# Patient Record
Sex: Male | Born: 1945
Health system: Southern US, Community
[De-identification: ages and names within clinical notes are randomized; demographics above are authoritative.]

## PROBLEM LIST (undated history)

## (undated) DIAGNOSIS — Z8739 Personal history of other diseases of the musculoskeletal system and connective tissue: Secondary | ICD-10-CM

## (undated) DIAGNOSIS — F32A Depression, unspecified: Secondary | ICD-10-CM

## (undated) DIAGNOSIS — M1990 Unspecified osteoarthritis, unspecified site: Secondary | ICD-10-CM

## (undated) DIAGNOSIS — E039 Hypothyroidism, unspecified: Secondary | ICD-10-CM

## (undated) DIAGNOSIS — Z860100 Personal history of colon polyps, unspecified: Secondary | ICD-10-CM

## (undated) DIAGNOSIS — I499 Cardiac arrhythmia, unspecified: Secondary | ICD-10-CM

## (undated) DIAGNOSIS — F319 Bipolar disorder, unspecified: Secondary | ICD-10-CM

## (undated) DIAGNOSIS — N419 Inflammatory disease of prostate, unspecified: Secondary | ICD-10-CM

## (undated) DIAGNOSIS — M549 Dorsalgia, unspecified: Secondary | ICD-10-CM

## (undated) DIAGNOSIS — G8929 Other chronic pain: Secondary | ICD-10-CM

## (undated) DIAGNOSIS — R972 Elevated prostate specific antigen [PSA]: Secondary | ICD-10-CM

## (undated) DIAGNOSIS — J45909 Unspecified asthma, uncomplicated: Secondary | ICD-10-CM

## (undated) DIAGNOSIS — K219 Gastro-esophageal reflux disease without esophagitis: Secondary | ICD-10-CM

## (undated) DIAGNOSIS — E785 Hyperlipidemia, unspecified: Secondary | ICD-10-CM

## (undated) DIAGNOSIS — F329 Major depressive disorder, single episode, unspecified: Secondary | ICD-10-CM

## (undated) DIAGNOSIS — I4891 Unspecified atrial fibrillation: Secondary | ICD-10-CM

## (undated) DIAGNOSIS — J449 Chronic obstructive pulmonary disease, unspecified: Secondary | ICD-10-CM

## (undated) DIAGNOSIS — D126 Benign neoplasm of colon, unspecified: Secondary | ICD-10-CM

## (undated) DIAGNOSIS — I10 Essential (primary) hypertension: Secondary | ICD-10-CM

## (undated) DIAGNOSIS — G4733 Obstructive sleep apnea (adult) (pediatric): Secondary | ICD-10-CM

## (undated) HISTORY — DX: Hypothyroidism, unspecified: E03.9

## (undated) HISTORY — PX: HAND / FINGER LESION EXCISION: SUR531

## (undated) HISTORY — DX: Personal history of colonic polyps: Z86.010

## (undated) HISTORY — DX: Essential (primary) hypertension: I10

## (undated) HISTORY — DX: Inflammatory disease of prostate, unspecified: N41.9

## (undated) HISTORY — DX: Personal history of colon polyps, unspecified: Z86.0100

## (undated) HISTORY — DX: Depression, unspecified: F32.A

## (undated) HISTORY — DX: Morbid (severe) obesity due to excess calories: E66.01

## (undated) HISTORY — PX: OTHER SURGICAL HISTORY: SHX169

## (undated) HISTORY — DX: Hyperlipidemia, unspecified: E78.5

## (undated) HISTORY — DX: Bipolar disorder, unspecified: F31.9

## (undated) HISTORY — DX: Major depressive disorder, single episode, unspecified: F32.9

## (undated) HISTORY — DX: Obstructive sleep apnea (adult) (pediatric): G47.33

## (undated) HISTORY — DX: Unspecified atrial fibrillation: I48.91

## (undated) HISTORY — DX: Benign neoplasm of colon, unspecified: D12.6

---
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
## 1993-07-15 HISTORY — PX: CATARACT EXTRACTION: SUR2

## 1999-04-04 ENCOUNTER — Inpatient Hospital Stay (HOSPITAL_COMMUNITY): Admission: EM | Admit: 1999-04-04 | Discharge: 1999-04-11 | Payer: Self-pay | Admitting: Cardiology

## 1999-06-11 ENCOUNTER — Ambulatory Visit (HOSPITAL_COMMUNITY): Admission: RE | Admit: 1999-06-11 | Discharge: 1999-06-11 | Payer: Self-pay | Admitting: Cardiology

## 1999-06-26 ENCOUNTER — Ambulatory Visit (HOSPITAL_COMMUNITY): Admission: RE | Admit: 1999-06-26 | Discharge: 1999-06-26 | Payer: Self-pay | Admitting: Cardiology

## 1999-11-20 ENCOUNTER — Inpatient Hospital Stay (HOSPITAL_COMMUNITY): Admission: EM | Admit: 1999-11-20 | Discharge: 1999-11-26 | Payer: Self-pay | Admitting: Emergency Medicine

## 2000-01-02 ENCOUNTER — Inpatient Hospital Stay (HOSPITAL_COMMUNITY): Admission: AD | Admit: 2000-01-02 | Discharge: 2000-01-04 | Payer: Self-pay | Admitting: *Deleted

## 2000-01-02 ENCOUNTER — Encounter: Payer: Self-pay | Admitting: *Deleted

## 2000-01-11 ENCOUNTER — Encounter: Payer: Self-pay | Admitting: Internal Medicine

## 2000-01-11 ENCOUNTER — Ambulatory Visit (HOSPITAL_COMMUNITY): Admission: RE | Admit: 2000-01-11 | Discharge: 2000-01-12 | Payer: Self-pay | Admitting: Internal Medicine

## 2000-11-13 ENCOUNTER — Ambulatory Visit (HOSPITAL_COMMUNITY): Admission: RE | Admit: 2000-11-13 | Discharge: 2000-11-13 | Payer: Self-pay | Admitting: General Surgery

## 2000-11-23 ENCOUNTER — Encounter: Payer: Self-pay | Admitting: Emergency Medicine

## 2000-11-23 ENCOUNTER — Inpatient Hospital Stay (HOSPITAL_COMMUNITY): Admission: EM | Admit: 2000-11-23 | Discharge: 2000-11-25 | Payer: Self-pay | Admitting: Emergency Medicine

## 2000-12-04 ENCOUNTER — Ambulatory Visit (HOSPITAL_COMMUNITY): Admission: RE | Admit: 2000-12-04 | Discharge: 2000-12-04 | Payer: Self-pay | Admitting: Urology

## 2000-12-30 ENCOUNTER — Ambulatory Visit: Admission: RE | Admit: 2000-12-30 | Discharge: 2000-12-30 | Payer: Self-pay | Admitting: Family Medicine

## 2001-02-02 ENCOUNTER — Ambulatory Visit: Admission: RE | Admit: 2001-02-02 | Discharge: 2001-02-02 | Payer: Self-pay | Admitting: Family Medicine

## 2001-02-19 ENCOUNTER — Ambulatory Visit (HOSPITAL_COMMUNITY): Admission: RE | Admit: 2001-02-19 | Discharge: 2001-02-19 | Payer: Self-pay | Admitting: Endocrinology

## 2001-02-24 ENCOUNTER — Ambulatory Visit (HOSPITAL_COMMUNITY): Admission: RE | Admit: 2001-02-24 | Discharge: 2001-02-24 | Payer: Self-pay | Admitting: Internal Medicine

## 2001-02-24 ENCOUNTER — Encounter: Payer: Self-pay | Admitting: Internal Medicine

## 2001-09-10 ENCOUNTER — Ambulatory Visit (HOSPITAL_COMMUNITY): Admission: RE | Admit: 2001-09-10 | Discharge: 2001-09-10 | Payer: Self-pay | Admitting: Family Medicine

## 2001-09-10 ENCOUNTER — Encounter: Payer: Self-pay | Admitting: Family Medicine

## 2001-10-13 ENCOUNTER — Encounter: Payer: Self-pay | Admitting: Neurosurgery

## 2001-10-13 ENCOUNTER — Encounter: Admission: RE | Admit: 2001-10-13 | Discharge: 2001-10-13 | Payer: Self-pay | Admitting: Neurosurgery

## 2001-10-27 ENCOUNTER — Encounter: Payer: Self-pay | Admitting: Neurosurgery

## 2001-10-27 ENCOUNTER — Encounter: Admission: RE | Admit: 2001-10-27 | Discharge: 2001-10-27 | Payer: Self-pay | Admitting: Neurosurgery

## 2001-11-16 ENCOUNTER — Encounter: Payer: Self-pay | Admitting: Neurosurgery

## 2001-11-16 ENCOUNTER — Ambulatory Visit (HOSPITAL_COMMUNITY): Admission: RE | Admit: 2001-11-16 | Discharge: 2001-11-16 | Payer: Self-pay | Admitting: Neurosurgery

## 2001-12-29 ENCOUNTER — Encounter
Admission: RE | Admit: 2001-12-29 | Discharge: 2002-03-18 | Payer: Self-pay | Admitting: Physical Medicine & Rehabilitation

## 2002-10-04 ENCOUNTER — Encounter
Admission: RE | Admit: 2002-10-04 | Discharge: 2002-11-26 | Payer: Self-pay | Admitting: Physical Medicine & Rehabilitation

## 2002-11-26 ENCOUNTER — Encounter
Admission: RE | Admit: 2002-11-26 | Discharge: 2003-02-24 | Payer: Self-pay | Admitting: Physical Medicine & Rehabilitation

## 2003-03-23 ENCOUNTER — Encounter
Admission: RE | Admit: 2003-03-23 | Discharge: 2003-06-21 | Payer: Self-pay | Admitting: Physical Medicine & Rehabilitation

## 2003-08-23 ENCOUNTER — Inpatient Hospital Stay (HOSPITAL_COMMUNITY): Admission: EM | Admit: 2003-08-23 | Discharge: 2003-08-28 | Payer: Self-pay | Admitting: Psychiatry

## 2004-01-12 ENCOUNTER — Ambulatory Visit (HOSPITAL_COMMUNITY): Admission: RE | Admit: 2004-01-12 | Discharge: 2004-01-12 | Payer: Self-pay | Admitting: Internal Medicine

## 2004-04-11 ENCOUNTER — Ambulatory Visit (HOSPITAL_COMMUNITY): Admission: RE | Admit: 2004-04-11 | Discharge: 2004-04-11 | Payer: Self-pay | Admitting: Family Medicine

## 2004-10-16 ENCOUNTER — Ambulatory Visit (HOSPITAL_COMMUNITY): Admission: RE | Admit: 2004-10-16 | Discharge: 2004-10-16 | Payer: Self-pay | Admitting: Family Medicine

## 2004-12-31 ENCOUNTER — Ambulatory Visit: Payer: Self-pay | Admitting: Internal Medicine

## 2005-01-29 ENCOUNTER — Ambulatory Visit (HOSPITAL_COMMUNITY): Admission: RE | Admit: 2005-01-29 | Discharge: 2005-01-29 | Payer: Self-pay | Admitting: Internal Medicine

## 2005-01-29 ENCOUNTER — Ambulatory Visit: Payer: Self-pay | Admitting: Internal Medicine

## 2006-03-24 ENCOUNTER — Ambulatory Visit (HOSPITAL_COMMUNITY): Admission: RE | Admit: 2006-03-24 | Discharge: 2006-03-24 | Payer: Self-pay | Admitting: Family Medicine

## 2009-04-18 ENCOUNTER — Encounter (INDEPENDENT_AMBULATORY_CARE_PROVIDER_SITE_OTHER): Payer: Self-pay | Admitting: *Deleted

## 2009-05-08 DIAGNOSIS — E039 Hypothyroidism, unspecified: Secondary | ICD-10-CM | POA: Insufficient documentation

## 2009-05-08 DIAGNOSIS — M545 Low back pain, unspecified: Secondary | ICD-10-CM | POA: Insufficient documentation

## 2009-05-08 DIAGNOSIS — R109 Unspecified abdominal pain: Secondary | ICD-10-CM | POA: Insufficient documentation

## 2009-05-08 DIAGNOSIS — K921 Melena: Secondary | ICD-10-CM | POA: Insufficient documentation

## 2009-05-08 DIAGNOSIS — Z8659 Personal history of other mental and behavioral disorders: Secondary | ICD-10-CM | POA: Insufficient documentation

## 2009-05-08 DIAGNOSIS — I10 Essential (primary) hypertension: Secondary | ICD-10-CM | POA: Insufficient documentation

## 2009-05-08 DIAGNOSIS — I4821 Permanent atrial fibrillation: Secondary | ICD-10-CM | POA: Insufficient documentation

## 2009-05-10 ENCOUNTER — Ambulatory Visit: Payer: Self-pay | Admitting: Gastroenterology

## 2009-05-10 DIAGNOSIS — Z860100 Personal history of colon polyps, unspecified: Secondary | ICD-10-CM | POA: Insufficient documentation

## 2009-05-11 ENCOUNTER — Encounter: Payer: Self-pay | Admitting: Internal Medicine

## 2009-05-12 ENCOUNTER — Telehealth: Payer: Self-pay | Admitting: Gastroenterology

## 2009-05-16 ENCOUNTER — Encounter: Payer: Self-pay | Admitting: Internal Medicine

## 2009-05-22 ENCOUNTER — Encounter: Payer: Self-pay | Admitting: Gastroenterology

## 2009-05-24 ENCOUNTER — Encounter: Payer: Self-pay | Admitting: Gastroenterology

## 2009-05-26 ENCOUNTER — Encounter: Payer: Self-pay | Admitting: Gastroenterology

## 2009-06-07 ENCOUNTER — Encounter: Payer: Self-pay | Admitting: Internal Medicine

## 2009-07-15 HISTORY — PX: COLONOSCOPY: SHX174

## 2009-07-27 ENCOUNTER — Encounter: Payer: Self-pay | Admitting: Internal Medicine

## 2009-07-27 ENCOUNTER — Ambulatory Visit: Payer: Self-pay | Admitting: Gastroenterology

## 2009-08-01 ENCOUNTER — Ambulatory Visit: Payer: Self-pay | Admitting: Internal Medicine

## 2009-08-01 ENCOUNTER — Ambulatory Visit (HOSPITAL_COMMUNITY): Admission: RE | Admit: 2009-08-01 | Discharge: 2009-08-01 | Payer: Self-pay | Admitting: Internal Medicine

## 2009-08-03 ENCOUNTER — Emergency Department (HOSPITAL_COMMUNITY): Admission: EM | Admit: 2009-08-03 | Discharge: 2009-08-03 | Payer: Self-pay | Admitting: Emergency Medicine

## 2009-08-03 ENCOUNTER — Encounter: Payer: Self-pay | Admitting: Orthopedic Surgery

## 2009-08-07 ENCOUNTER — Ambulatory Visit: Payer: Self-pay | Admitting: Orthopedic Surgery

## 2009-08-07 DIAGNOSIS — IMO0002 Reserved for concepts with insufficient information to code with codable children: Secondary | ICD-10-CM | POA: Insufficient documentation

## 2009-08-07 DIAGNOSIS — M25569 Pain in unspecified knee: Secondary | ICD-10-CM | POA: Insufficient documentation

## 2009-08-16 ENCOUNTER — Telehealth: Payer: Self-pay | Admitting: Orthopedic Surgery

## 2009-09-27 ENCOUNTER — Ambulatory Visit (HOSPITAL_COMMUNITY): Admission: RE | Admit: 2009-09-27 | Discharge: 2009-09-27 | Payer: Self-pay | Admitting: Family Medicine

## 2009-10-24 DIAGNOSIS — G4733 Obstructive sleep apnea (adult) (pediatric): Secondary | ICD-10-CM

## 2009-10-24 HISTORY — DX: Obstructive sleep apnea (adult) (pediatric): G47.33

## 2010-01-12 DIAGNOSIS — I4891 Unspecified atrial fibrillation: Secondary | ICD-10-CM

## 2010-01-12 HISTORY — DX: Unspecified atrial fibrillation: I48.91

## 2010-07-11 ENCOUNTER — Ambulatory Visit (HOSPITAL_COMMUNITY)
Admission: RE | Admit: 2010-07-11 | Discharge: 2010-07-11 | Payer: Self-pay | Source: Home / Self Care | Attending: Cardiovascular Disease | Admitting: Cardiovascular Disease

## 2010-07-19 ENCOUNTER — Ambulatory Visit (HOSPITAL_COMMUNITY)
Admission: RE | Admit: 2010-07-19 | Discharge: 2010-07-19 | Payer: Self-pay | Source: Home / Self Care | Attending: Cardiovascular Disease | Admitting: Cardiovascular Disease

## 2010-07-19 LAB — PROTIME-INR
INR: 1.97 — ABNORMAL HIGH (ref 0.00–1.49)
Prothrombin Time: 22.6 seconds — ABNORMAL HIGH (ref 11.6–15.2)

## 2010-08-05 ENCOUNTER — Encounter: Payer: Self-pay | Admitting: Orthopedic Surgery

## 2010-08-14 NOTE — Letter (Signed)
Summary: TCS ORDER  TCS ORDER   Imported By: Ave Filter 07/27/2009 17:43:01  _____________________________________________________________________  External Attachment:    Type:   Image     Comment:   External Document

## 2010-08-14 NOTE — Assessment & Plan Note (Signed)
Summary: ER/knee pain/xrays ap/humana/frs   Vital Signs:  Patient profile:   65 year old male Weight:      255 pounds Pulse rate:   82 / minute Resp:     18 per minute  Vitals Entered By: Fuller Canada MD (August 07, 2009 10:02 AM)  Referring Provider:  Nobie Putnam Primary Provider:  Patrica Duel, MD   History of Present Illness: I saw Fender Beightol in the office today for an initial visit.  He is a 65 years old man with the complaint of:  chief complaint:  right knee pain, ap er.  This is a very interesting patient.  Apparently he has a chronic back situation is on Tylox for pain he seen at [facility] for that.  He was going to his normal routine the backs exercises and he felt something pop in his knee as he was changing positions.  He went out to play with his grandchildren as he usually does but the next morning woke up and he couldn't bend his knee and had swelling.  Since that time his anterior knee pain which is a 9/10 very to a 7/10 and is worse when he tries to bend the knee.  He did go to emergency room at the request of his physician and x-rays there were negative, he was placed in immobilizer and presents today for further evaluation.  -other symptoms pain is described as constant pain achy.  Has stiffness of the knee, no catching or locking.  -xrays done & where:  08/03/09 right knee [facility].  Meds: Lisinopril/HCTZ, Oxcarbazepin, Synthroid, Propafenone, Coumadin, Vitamin B100, Vitamin C 500, Xanax, Ambien, Tylox.   Allergies (verified): No Known Drug Allergies  Past History:  Past Surgical History: RADIOFREQUENCY ABLATION Appendectomy Cataract Extraction, bilaterally Tonsillectomy Nerve block for chronic back pain circumsicion colonoscopy  Family History: Father, heart valve replacement Motehr, heart disease, DM, PNA No FH of CRC. Pat uncle deceased secondary ?gastric cancer. FH of Cancer:  Family History of Diabetes Family History Coronary Heart  Disease male < 72 Family History of Arthritis Hx, family, asthma  Social History: One child. Married. disabled Patient has never smoked.  Alcohol Use - no Illicit Drug Use - no no caffeine  Review of Systems General:  Complains of weight gain, fever, and fatigue; denies weight loss and chills. Cardiac :  Denies chest pain, angina, heart attack, heart failure, poor circulation, blood clots, and phlebitis. Resp:  Complains of short of breath and cough; denies difficulty breathing, COPD, and pneumonia. GI:  Complains of diarrhea and constipation; denies nausea, vomiting, difficulty swallowing, ulcers, GERD, and reflux. GU:  Complains of kidney stones, burning, and blood in urine; denies kidney failure, kidney transplant, poor stream, testicular cancer, and . Neuro:  Complains of headache, dizziness, weakness, and unsteady walking; denies migraines, numbness, and tremor. MS:  Complains of joint pain, rheumatoid arthritis, joint swelling, and gout; denies bone cancer, osteoporosis, and . Endo:  Complains of thyroid disease; denies goiter and diabetes. Psych:  Complains of bipolar; denies depression, mood swings, anxiety, panic attack, and schizophrenia. Derm:  Complains of itching; denies eczema and cancer. EENT:  Complains of cataracts, vertigo, sinusitis, toothaches, and bleeding gums; denies poor vision, glaucoma, poor hearing, ears ringing, and hoarseness. Immunology:  Complains of seasonal allergies and sinus problems; denies allergic to bee stings. Lymphatic:  Denies lymph node cancer and lymph edema.  Physical Exam  Additional Exam:  GEN: well developed, well nourished, normal grooming and hygiene, no deformity and normal body habitus.  CDV: pulses are normal, no edema, no erythema. no tenderness  Lymph: normal lymph nodes   Skin: no rashes, skin lesions or open sores   NEURO: normal coordination, reflexes, sensation.   Psyche: awake, alert and oriented. Mood normal    Gait: altered by the knee immobilizer and crutches  RIGHT knee Inspection revealed a very tense patient with probably an effusion in the knee but I couldn't tell because he never could relax his knee.  I got up to actively flex at 30 it was painful.  His muscle tone was normal.  I was able to test his collateral ligament stability which was intact.  LEFT knee full range of motion no swelling strength normal, knee stable.     Impression & Recommendations:  Problem # 1:  KNEE SPRAIN, ACUTE (ICD-844.9) Assessment New reviewed x-ray at the hospital I agree its normal are reviewed the report and agree with supportive state   unfortunately I could not examine this knee. Patient was too tense would not relax Says he is always stiff.  Recommend MRI to evaluate joint.  Recommend hinged knee brace as well to replace immobilizer.  Orders: New Patient Level III (36644)  Patient Instructions: 1)  MRI knee - right  2)  sprained knee  3)  Come back with films

## 2010-08-14 NOTE — Assessment & Plan Note (Signed)
Summary: PT NEEDS TO SET UP TCS HE WAS UNABLE TO KEEP HIS 11/10 APPT. ...   Visit Type:  F/U Primary Care Provider:  Patrica Duel, MD  Chief Complaint:  set up procedure.  History of Present Illness: Mr. Casey [name] is a pleasant 65 year old gentleman, patient of Dr. Patrica Duel, who presents to reschedule his colonscopy.  He has h/o left-sided abdominal pain and intermittent hematachezia. He tells me has been extensively evaluated at [facility] by his urologist, Dr. Gillian Shields.  He describes numerous imaging studies.  Patient tells me about 5 months ago he started having pain in his left side/flank. He also noted change in bowel movements.  Went from one BM daily to 3 every morning.  Stools hard to loose. He thought the pain was secondary to kidney stones. He also had hematuria in the setting of Coumadin therapy. He was told that he has two kidney stones on each side. They're very small and should not cause problems until he starts passing them. He states he has passed one from the left side. He says he is going to have upcoming surgery for what sounds like a uretheral stricture but his urologist will not do it until his has a colonoscopy. He also has been seen by a neurosurgeon.  He tells me that he has four bulging discs in his lower back which have been present for about 8 years. He has never had surgery.  Recently underwent nerve block and PT for muscle spasms.  His left-sided flank pain comes and goes. It is worse when he lies down especially at night. He usually sleeps about 3 hours and then wakes up with left-sided pain. He will then take a pain pill to go back to sleep. Whenever he is upright or sitting he has no pain.  Pain seems to be unrelated to bowel movements. He reports occasional brbpr especially when his INR is close to 3.  Denies melena, heartburn, vomiting, difficulty swallowing. Pain is not associated with meals. He lost about 30 pounds in the past 3 months but attributes to dietary  changes. He's gained about 15 pounds back however.  He notes his left sided abd pain goes away when his low back pain is gone.       Current Medications (verified): 1)  Rythmol 150 Mg Tabs (Propafenone Hcl) .... Take 1 Tablet By Mouth Two Times A Day 2)  Coumadin 7.5 Mg Tabs (Warfarin Sodium) .... As Directed 3)  Levoxyl 75 Mcg Tabs (Levothyroxine Sodium) .... Once Daily 4)  Trileptal 150 Mg Tabs (Oxcarbazepine) .... Take 2 Tablets in The Am and One Tablet in The Pm 5)  Lisinopril-Hydrochlorothiazide 20-12.5 Mg Tabs (Lisinopril-Hydrochlorothiazide) .... Two Tablets Every Am 6)  Tylox 5-500 Mg Caps (Oxycodone-Acetaminophen) .... As Needed 7)  Vitamin B100 .... Take 1 Tablet By Mouth Once A Day 8)  Vitamin C-500 Chewables .... Take Two Tablets Three Times Daily 9)  Ambien 10 Mg Tabs (Zolpidem Tartrate) .... Take 1 Tab By Mouth At Bedtime As Needed 10)  Alprazolam 0.5 Mg Tabs (Alprazolam) .... One Tablet At Bedtime 11)  Coumadin 10 Mg Tabs (Warfarin Sodium) .... Tues &sat  Allergies (verified): No Known Drug Allergies  Past History:  Past Medical History: Last updated: 05/10/2009 kidney stones Arthritis Colonoscopy, July 2006, Dr. Jena Gauss. Normal rectum, left-sided diverticula, terminal ileum normal to 10 cm. Colonoscopy, May 2002, Dr. Franky Macho, multiple polyps in the cecum, proximal transverse colon, rectum. Patient states that he developed a post polypectomy bleed requiring transfusion  and hospitalization. Hypothyroidism Bipolar disorder Atrial fibrillation Hypertension Chronic low back pain  Family History: Last updated: 05/10/2009 Father, heart valve replacement Motehr, heart disease, DM, PNA No FH of CRC. Pat uncle deceased secondary ?gastric cancer.  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  Alcohol Use - no Illicit Drug Use - no  Past Surgical History: RADIOFREQUENCY  ABLATION Appendectomy Cataract Extraction, bilaterally Tonsillectomy Nerve block for chronic back pain  Review of Systems General:  Denies fever, chills, sweats, anorexia, fatigue, weakness, and weight loss. Eyes:  Denies vision loss. ENT:  Denies nasal congestion, hoarseness, and difficulty swallowing. CV:  Denies chest pains, angina, palpitations, dyspnea on exertion, and peripheral edema. Resp:  Denies dyspnea at rest, dyspnea with exercise, cough, and sputum. GI:  See HPI. GU:  Denies urinary burning and blood in urine. MS:  Complains of low back pain and muscle cramps. Derm:  Denies rash and itching. Neuro:  Denies weakness, paralysis, abnormal sensation, seizures, frequent headaches, memory loss, and confusion. Psych:  Denies depression and anxiety. Endo:  Denies unusual weight change. Heme:  Denies bruising and bleeding. Allergy:  Denies hives and rash.  Vital Signs:  Patient profile:   64 year old male Height:      73 inches Weight:      262 pounds BMI:     34.69 Temp:     98.6 degrees F oral Pulse rate:   64 / minute BP sitting:   124 / 82  (left arm) Cuff size:   large  Vitals Entered By: Hendricks Limes LPN (July 27, 2009 10:37 AM)  Physical Exam  General:  Well developed, well nourished, no acute distress.obese.   Head:  Normocephalic and atraumatic. Eyes:  Conjunctivae pink, no scleral icterus.  Mouth:  Oropharyngeal mucosa moist, pink.  No lesions, erythema or exudate.    Neck:  Supple; no masses or thyromegaly. Lungs:  Clear throughout to auscultation. Heart:  Regular rate and rhythm; no murmurs, rubs,  or bruits. Abdomen:  Bowel sounds normal.  Abdomen is soft, mild left flank tenderness, nondistended.  No rebound or guarding.  No hepatosplenomegaly, masses or hernias.  No abdominal bruits.  Rectal:  deferred until time of colonoscopy.   Extremities:  No clubbing, cyanosis, edema or deformities noted. Neurologic:  Alert and  oriented x4;  grossly normal  neurologically. Skin:  Intact without significant lesions or rashes. Cervical Nodes:  No significant cervical adenopathy. Psych:  Alert and cooperative. Normal mood and affect.  Impression & Recommendations:  Problem # 1:  HEMATOCHEZIA (ICD-578.1)  Intermittent hematochezia in the setting of Coumadin therapy. He also has a history of colonic polyps, last colonoscopy over four years ago.  Recommend colonoscopy for further evaluation. He previously developed a post-polypectomy bleed after colonoscopy in 2002. Patient states at that time he only held coumadin one day before and after the procedure. Will plan on holding his Coumadin 4 days prior to procedure, as previously approved by Dr. Nobie Putnam.  Colonoscopy to be performed in near future.  Risks, alternatives, and benefits including but not limited to the risk of reaction to medication, bleeding, infection, and perforation were addressed.  Patient voiced understanding and provided verbal consent.     Problem # 2:  ABDOMINAL PAIN (ICD-789.00)  left-sided abdominal pain 5 months duration. Pain is primarily in the left flank area. Pain is associated with lying down. Unrelated to meals or bowel function.  At this point would be suspicious this pain  may be secondary to radiculopathy from his back as it is positional.    Other Orders: Est. Patient Level IV (16109)

## 2010-08-14 NOTE — Letter (Signed)
Summary: History form  History form   Imported By: Jacklynn Ganong 08/09/2009 14:49:07  _____________________________________________________________________  External Attachment:    Type:   Image     Comment:   External Document

## 2010-08-14 NOTE — Progress Notes (Signed)
Summary: Patient cx his MRI.  Phone Note Call from Patient   Caller: Patient Summary of Call: Patient called to cancel his MRI and follow up appointment. He is going back to his old doctor because of his insurance. Initial call taken by: Waldon Reining,  August 16, 2009 3:02 PM

## 2010-08-16 ENCOUNTER — Ambulatory Visit (HOSPITAL_COMMUNITY)
Admission: RE | Admit: 2010-08-16 | Discharge: 2010-08-16 | Disposition: A | Discharge status: Home / Self Care | Payer: Medicare PPO | Source: Ambulatory Visit | Attending: Cardiovascular Disease | Admitting: Cardiovascular Disease

## 2010-08-16 DIAGNOSIS — E669 Obesity, unspecified: Secondary | ICD-10-CM | POA: Insufficient documentation

## 2010-08-16 DIAGNOSIS — I4891 Unspecified atrial fibrillation: Secondary | ICD-10-CM | POA: Insufficient documentation

## 2010-08-16 DIAGNOSIS — F319 Bipolar disorder, unspecified: Secondary | ICD-10-CM | POA: Insufficient documentation

## 2010-08-16 DIAGNOSIS — I10 Essential (primary) hypertension: Secondary | ICD-10-CM | POA: Insufficient documentation

## 2010-08-16 DIAGNOSIS — R0602 Shortness of breath: Secondary | ICD-10-CM | POA: Insufficient documentation

## 2010-08-16 DIAGNOSIS — Z01810 Encounter for preprocedural cardiovascular examination: Secondary | ICD-10-CM | POA: Insufficient documentation

## 2010-08-18 NOTE — Op Note (Signed)
  Casey [name], Casey [name]                ACCOUNT NO.:  [id number]  MEDICAL RECORD NO.:  [id number]           PATIENT TYPE:  O  LOCATION:  CATH                         FACILITY:  [facility]  PHYSICIAN:  Thurmon Fair, MD     DATE OF BIRTH:  May 19, 1946  DATE OF PROCEDURE:  08/16/2010 DATE OF DISCHARGE:  08/16/2010                              OPERATIVE REPORT   Casey [name] is a 65 year old man with recurrent symptomatic atrial fibrillation.  A previous attempt at cardioversion was successful, but atrial fibrillation recurred despite treatment with low-dose propafenone.  He is now on a much high dose of propafenone and he is here for repeat attempt at cardioversion.  After risks and benefits of the procedure were described, the patient provided informed consent and was brought to the outpatient area in a fasting state.  His prothrombin time had been therapeutic for 4 weeks prior to this procedure.  Anesthesia was administered by Dr. Chaney Malling of the Anesthesiology Service using propofol 150 mg intravenously.  A single synchronized biphasic 150 joule cardioversion shock was administered via anterior and posterior chest pads with immediate conversion to sinus bradycardia with a rate of approximately 55 beats per minute.  A 1.8 second pause was seen immediately following the cardioversion shock.  No immediate complications occurred.     Thurmon Fair, MD     MC/MEDQ  D:  08/16/2010  T:  08/17/2010  Job:  [id number]  cc:   [facility]  Electronically Signed by Thurmon Fair M.D. on 08/17/2010 08:39:38 AM

## 2010-08-30 NOTE — H&P (Signed)
NAME:  Casey [name], Casey [name]                ACCOUNT NO.:  [id number]  MEDICAL RECORD NO.:  [id number]           PATIENT TYPE:  LOCATION:                                 FACILITY:  PHYSICIAN:  Thurmon Fair, MD     DATE OF BIRTH:  Dec 29, 1945  DATE OF ADMISSION: DATE OF DISCHARGE:                             HISTORY & PHYSICAL   This is an anticipation of a direct current synchronized cardioversion to be performed tomorrow on August 16, 2010.  Please refer to the recently dictated medical history and physical from July 24, 2010 as well as the updates from July 26, 2100 and the cardioversion procedure note from July 19, 2010.  Casey [name] is a 65 year old gentleman with a recurrent persistent atrial fibrillation.  Despite rate control, he remains symptomatic with complaints of dyspnea on exertion and general fatigue.  He is fully and chronically anticoagulated with warfarin.  He has never had a stroke. He does have a remote history of atrial flutter and underwent successful isthmus ablation many years ago.  He has never had a cardiac catheterization, but noninvasive workup with a Persantine Myoview performed in July 2011 showed no evidence of coronary insufficiency.  By echocardiogram, the extent of structural heart disease appears to be mild, left atrial dilatation with a left atrial diameter of 43 mm.  He has mild-to-moderate mitral regurgitation that is likely not hemodynamically significant.  Major comorbidities include obstructive sleep apnea for which he uses chronic CPAP and systemic hypertension with good control.  He has planned to undergo urological surgery for urethral strictures later this spring probably in March.  He underwent direct current cardioversion on July 19, 2010 and immediately felt improvement in stamina and reduction of breathlessness; however, he had early recurrence of atrial fibrillation despite taking propafenone 150 mg three times a day.  It  appears that atrial fibrillation recurred probably in the first 48-72 hours after cardioversion.  Since that time, we have escalated the dose of propafenone to 225 mg t.i.d. and subsequent to 300 mg t.i.d., but he remains in atrial fibrillation.  At the highest dose of propafenone, he has began to notice side effects such as worsening fatigue and dyspnea. He remains in atrial fibrillation with excellent rate control.  REVIEW OF SYSTEMS:  Significantly negative for chest pain, orthopnea, paroxysmal nocturnal dyspnea, edema, palpitations, dizziness, lightheadedness, syncope, focal neurological deficits, or intermittent claudication.  CURRENT MEDICATIONS: 1. Losartan/hydrochlorothiazide 100/25 once daily. 2. Oxcarbazepine 600 mg two tablets p.o. b.i.d. 3. Propafenone 300 mg t.i.d. 4. Levothyroxine 75 mcg daily. 5. Warfarin 13 mg daily. 6. Zolpidem 10 mg at bedtime p.r.n. 7. Furosemide 40 mg p.r.n. 8. Alprazolam 0.5 mg p.r.n. 9. Pravastatin 20 mg at bedtime daily. 10.Cipro 500 mg, for which he has a standing prescription for     recurrent urinary tract infections. 11.Oxycodone/acetaminophen 5/500 one p.o. every 4-6 hours p.r.n. 12.Omeprazole 40 mg daily p.r.n.  He does have allergies to LITHIUM and NORVASC.  PHYSICAL EXAMINATION:  VITAL SIGNS:  Blood pressure is 120/80, heart rate is 74, respiratory rate is 12, he weighs 260 pounds and a 60 feet tall for  BMI of 35. NECK:  Jugular venous pulsations are not elevated.  There is no hepatojugular reflux.  There are no carotid bruits. LUNGS:  Clear to auscultation bilaterally. CARDIOVASCULAR:  Shows normal position and quality of the apical impulse.  The rhythm is irregular.  There are no audible murmurs, rubs, or gallops. ABDOMEN:  Obese, but there is no evidence of tenderness, distention, hepatosplenomegaly or masses.  The bowel sounds are normal.  There are no flank bruits. EXTREMITIES:  Show no clubbing, cyanosis or edema.  He has  3+ pulses in the radials, ulnars, brachials, femorals and pedals bilaterally. NEUROLOGIC:  Brief neurological exam does not show any gross focal deficits.  Electrocardiogram shows atrial fibrillation with controlled rate and with a QT interval of around 430 milliseconds.  There was a minor intraventricular conduction delay mostly resemble an incomplete right bundle-branch block.  ASSESSMENT AND PLAN:  Casey [name] is still symptomatic with atrial fibrillation and according to our initial plans, we will go ahead with a synchronized cardioversion tomorrow.  If he again has early failure on even this high dose of propafenone, then an alternative antiarrhythmic agents such as sotalol may be considered.  Multaq is another option.  I would avoid amiodarone because the patient is very young age.  He does not have a history of congestive heart failure, so either sotalol or Multaq should be acceptable.  Finally, if he fails treatment of these medications as well, one might consider referral for atrial fibrillation ablation.  Regardless, continued the anticoagulation with warfarin is critical.  The risks and benefits of synchronous cardioversion were discussed with Mr. Fier.  He understands and agrees to proceed.     Thurmon Fair, MD     MC/MEDQ  D:  08/15/2010  T:  08/15/2010  Job:  [id number]  cc:   Corrie Mckusick, M.D.  Electronically Signed by Thurmon Fair M.D. on 08/30/2010 01:10:04 PM

## 2010-11-30 NOTE — Op Note (Signed)
[facility]  Patient:    Casey [name], Casey [name]                       MRN: [id number] Proc. Date: 12/04/00 Adm. Date:  [id number] Attending:  Evlyn Clines                           Operative Report  PROCEDURE:  Circumcision.  PREOPERATIVE DIAGNOSIS:  Phimosis.  POSTOPERATIVE DIAGNOSIS:  Phimosis.  SURGEON:  Dr. Bjorn Pippin.  ANESTHESIA:  MAC and local.  COMPLICATIONS:  None.  INDICATIONS FOR PROCEDURE:  Mr. Vacha is a 65 year old white male who has painful phimosis. He is to undergo circumcision.  FINDINGS AND PROCEDURE:  The patient is taken to the operating room where he is placed in the supine position. He was given IV sedation. His genitalia was prepped with Betadine solution and he was draped in the usual sterile fashion. The penis was infiltrated with approximately 10% of a 50/50 mixture of 1% lidocaine and 0.5% Marcaine. Once adequate penile block had been performed, the sleeve resection of the redundant foreskin was performed. Hemostasis was achieved with the Bovie. The skin edges were then reapproximated with a running 4-0 chromic interrupted in the quadrants. Once the circumcision was completed, a dressing of xeroform gauze, cling and Coban was applied. The patient was then moved to the recovery room in stable condition and there were no complications. DD:  12/04/00 TD:  12/04/00 Job: [id number]

## 2010-11-30 NOTE — Procedures (Signed)
[place]  Patient:    Casey [name], Casey [name]                       MRN: [id number] Proc. Date: 11/21/99 Adm. Date:  [id number] Attending:  Armanda Magic CC:         Armanda Magic, M.D.                           Procedure Report  PROCEDURE:  TEE-guided DC cardioversion.  COMPLICATIONS:  None.  ATTENDING:  Lenise Herald, M.D.  ANESTHESIA:  Judie Petit, M.D.  INDICATIONS:  Casey [name] is a 65 year old white male, a patient of Dr. Armanda Magic, with a history of atrial fibrillation/flutter dating back to September 2000.  At that point he converted with p.o. diltiazem and digoxin. He has been maintained on Coumadin for six months, which was discontinued in January.  He came back to the office with recurrent atrial fibrillation/flutter and increasing shortness of breath.  He is now referred for TEE-guided DC cardioversion.  DESCRIPTION OF PROCEDURE:  After giving informed written consent, the patient was brought to the fluoroscopy suite, where he underwent successful, uncomplicated transesophageal echocardiogram.  This revealed normal left ventricular function with an EF of 60%.  He had structurally normal aortic valve, with trivial AI; structurally normal mitral valve with trivial to mild MR; and a structurally normal tricuspid valve with trivial TR.  There was no evidence of any cardiac thrombus or mass.  Following that, anesthesia was paged.  Dr. Randa Evens administered 110 mg of Propofol for adequate anesthesia.  One shock at 300 joules was then delivered and the patient was converted to normal sinus rhythm without complications. He had a post-cardioversion heart rate of 58 and blood pressure of 108/74.  The patient awoke in stable condition.  He was transported to the recovery room without complications.  DD:  11/21/99 TD:  11/22/99 Job: [id number]

## 2010-11-30 NOTE — Discharge Summary (Signed)
NAME:  Casey [name], Casey [name]                          ACCOUNT NO.:  [id number]   MEDICAL RECORD NO.:  [id number]                   PATIENT TYPE:  IPS   LOCATION:  0503                                 FACILITY:  [facility]   PHYSICIAN:  Geoffery Lyons, M.D.                   DATE OF BIRTH:  25-Feb-1946   DATE OF ADMISSION:  08/23/2003  DATE OF DISCHARGE:  08/28/2003                                 DISCHARGE SUMMARY   CHIEF COMPLAINT AND PRESENTING ILLNESS:  This was the first admission to   [facility] for this 65 year old married white male.  History of depression with suicidal ideations, no given plan.  He had  apparently been lying on the couch, sleeping all the time.  Had been on  lithium for 30 years, stopped it abruptly in October due to some difficulty  with tremors.  He was placed on Risperdal with side effects, so it was  discontinued.  Continued to endorse passive suicidal ideations.  Lost 30  pounds in six months, unable to pick himself up to be himself.   PAST PSYCHIATRIC HISTORY:  First time [facility], diagnosed  bipolar and had been stable on lithium until it was discontinued.   ALCOHOL AND DRUG HISTORY:  Denies the use or abuse of any substances.   PAST MEDICAL HISTORY:  Low back pain, hypothyroidism, atrial fibrillation.   MEDICATIONS:  Rythmol 225 mg three times a day, Coumadin 7.5 mg daily,  Risperdal twice a day, Levoxyl 75 mcg daily, Skelaxin and ________.  Had  also been on Lamictal 100 mg twice a day up until October.   PHYSICAL EXAMINATION:  Performed and failed to show any acute findings.   LABORATORY DATA:  CBC within normal limits.  Blood chemistry within normal  limits.  Thyroid profile within normal limits.   MENTAL STATUS EXAM:  Upon admission revealed an alert, middle-aged male.  Cooperative.  Good eye contact.  Mood depressed.  Affect depressed.  Not  spontaneous.  Some psychomotor retardation.  Thought processes coherent and  relevant, deals with the events that led to the admission, the symptoms.  Is  wanting to get better.  Some vague suicide ruminations.  No plan at the time  of the evaluation.  Cognition well-preserved.   ADMISSION DIAGNOSES:   AXIS I:  Bipolar disorder, depressed.   AXIS II:  No diagnosis.   AXIS III:  1. Back problems.  2. Hypothyroidism.  3. Coronary artery disease.   AXIS IV:  Moderate.   AXIS V:  Global Assessment of Functioning upon admission 30; highest Global  Assessment of Functioning in the last year 70.   HOSPITAL COURSE:  He was admitted and started intensive individual and group  psychotherapy.  We went ahead and maintained the Risperdal 0.5 mg at night.  She was maintained on the Rythmol, the Coumadin, Levoxyl, Bextra, Skelaxin  and Lamictal.  Lithium  was restarted but he did endorse that all the time he  was on lithium, he experienced a tremor that could at times be  incapacitating.  Endorsed the increased depression with anhedonia, no  energy, no motivation, in bed most of the time, hopeless, helpless, endorsed  some suicidal ruminations although he could contract for safety.  We  considered getting him back on lithium but, because of the tremor and his  history of hypothyroidism and having had some sort of liver disease, we  decided against it.  We went ahead and also discontinued the Risperdal as he  felt once he was placed on the Risperdal he was getting more depressed.  We  went ahead and started Depakote.  Still endorsing the depressed mood,  although he claimed to start feeling better.  He tolerated the medication  well which was encouraging.  He felt it was all part of his disorder as he  denied any particular stress or stressors to justify the feeling he was  having.  We went ahead and continued to stabilize with lithium and Depakote.  He was denying any suicidal or homicidal ideation.  Overall, felt much  better, encouraged.  He felt he could be  discharged home safely and he was  willing to pursue outpatient treatment.  We went ahead and discharged to  outpatient follow-up.   DISCHARGE DIAGNOSES:   AXIS I:  Bipolar disorder, depressed.   AXIS II:  No diagnosis.   AXIS III:  1. Coronary artery disease.  2. Back problems.  3. Hypothyroidism.   AXIS IV:  Moderate.   AXIS V:  Global Assessment of Functioning upon discharge 50.   DISCHARGE MEDICATIONS:  1. Lamictal 100 mg twice a day.  2. Depakote ER 250 mg twice a day.  3. Lexapro 10 mg daily.  4. Coumadin 7.5 mg Monday, Tuesday, Wednesday, Thursday, and Friday.  5. Coumadin 5 mg on Saturday and Sunday.  6. Synthroid 75 mcg daily.  7. Rythmol 225 mg, 1 three times a day.  8. Bextra (as previously used).  9. Skelaxin (as previously used).   FOLLOW UP:  [facility].                                               Geoffery Lyons, M.D.    IL/MEDQ  D:  09/16/2003  T:  09/17/2003  Job:  [id number]

## 2010-11-30 NOTE — Consult Note (Signed)
NAME:  Casey [name], Casey [name]                ACCOUNT NO.:  [id number]   MEDICAL RECORD NO.:  [id number]          PATIENT TYPE:  AMB   LOCATION:  DAY                           FACILITY:  [facility]   PHYSICIAN:  R. Roetta Sessions, M.D. DATE OF BIRTH:  [birth year]   DATE OF CONSULTATION:  12/31/2004  DATE OF DISCHARGE:                                   CONSULTATION   REASON FOR CONSULTATION:  Hematochezia.   HISTORY OF PRESENT ILLNESS:  Mr. Casey [name] 65 year old  Caucasian male sent over through the courtesy of Dr. Artis [name] to further  evaluate recent hematochezia.  Casey [name] he was  down in [place] on vacation, and he was walking around quite a bit.  He  noted painless blood per rectum, noticed it sometimes with having a bowel  movement and other times just noted blood without a bowel movement.  He is  not having any associated recent abdominal pain.  He was on Coumadin.  He  stopped it, but he has since gone back on it for what sounds like paroxysmal  atrial fibrillation.  He is followed by Dr. Jenne Campus for that problem.   He tells me he had some hematochezia some four years [name] for which Dr.  Lovell Sheehan performed a colonoscopy.  He tells me multiple polyps were removed.  He suffered post polypectomy bleed, required hospitalization and 2-unit  transfusion.  There is no family history for colorectal cancer in his first  degree relatives.  There is a paternal great uncle with stomach cancer.  Casey [name] denies any upper GI tract symptoms such as odynophagia,  dysphagia, early satiety, reflux symptoms, nausea, vomiting.  No change in  weight.  He has not had any melena.  He is not taking any nonsteroidals.   He was having some back pain back in March for which he had an abdominal and  pelvic CT without significant findings.   PAST MEDICAL HISTORY:  1.  Bipolar illness.  2.  Hypothyroidism.  3.  Atrial fibrillation.  4.  Hypertension.  5.  Chronic low  back pain.  6.  Status post radiofrequency ablation by [facility] cardiologist previously.  7.  Colonoscopy as outlined above.   CURRENT MEDICATIONS:  1.  Rythmol 225 mg t.i.d.  2.  Coumadin 7.5 mg daily.  3.  Levoxyl 75 mg a day.  4.  Skelaxin 800 mg p.r.n.  5.  Trileptal 150 mg four times a day.  6.  Wellbutrin XL 300 mg daily.  7.  Benicar/HCTZ 4/12.5 mg daily.  8.  Aripiprazole 5 mg daily.   ALLERGIES:  No known drug allergies.   FAMILY HISTORY:  Mother is alive at 16.  She has had heart problems with  valve replacement.  Mother succumbed to pneumonia.  She was a diabetic.   SOCIAL HISTORY:  The patient is married, disabled based on multiple above  medical problems.  No tobacco, no alcohol.   REVIEW OF SYSTEMS:  As above.  No recent chest pain, dyspnea on exertion.  No fever or chills.  PHYSICAL EXAMINATION:  GENERAL:  A 65 year old gentleman resting  comfortably.  VITAL SIGNS:  Weight 240, height 6 feet 1 inch.  Temperature 98.4, blood  pressure 136/76, pulse 62.  SKIN:  Warm and dry, no jaundice.  No stigmata of chronic liver disease.  HEENT:  No scleral icterus.  Conjunctivae pink.  Oral cavity no lesions.  CHEST:  Lungs are clear to auscultation.  CARDIAC:  Regular rate and rhythm without murmur, gallop, or rub.  ABDOMEN:  Nondistended.  Positive bowel sounds.  He does have some  subcutaneous, fairly moveable, normal-size lesions which he has been told  previously are lipomas.  He has them in multiple locations per his report.  Abdomen is soft and nontender without appreciable mass or organomegaly.  EXTREMITIES:  No edema.  RECTAL:  Exam is deferred to time of colonoscopy.   IMPRESSION:  Casey [name] is a [name] 65 year old gentleman with  recent painless hematochezia in the setting of Coumadin therapy.   It has been four years since he had a colonoscopy.  He apparently had post  polypectomy bleed per his report.  Would like to get Dr. Lovell Sheehan' reports  for  review.   In this setting, since it has been some time since his last colonoscopy,  will probably opt to go ahead and do another colonoscopy.  I have discussed  this approach with Casey [name].  Potential risks, benefits, and alternatives  have been reviewed. Will check with Dr. Jenne Campus to see if it is okay to just  stop his Coumadin for four days prior to the  procedure.  Will also see if there is a need for SBE prophylaxis.  Will make  further recommendations in the very near future.   I would like to thank Dr. Artis [name] for allowing me to see this nice  gentleman.       RMR/MEDQ  D:  12/31/2004  T:  12/31/2004  Job:  [id number]   cc:   Madelin Rear. Sherwood Gambler, MD  [address]  [place]  [place] [address]  Fax: [phone]   Darlin Priestly, MD  [phone] [address], [address]  [place]  [place] [address]  Fax: [phone]

## 2010-11-30 NOTE — Consult Note (Signed)
[place]  Patient:    Casey [name], Casey [name]                       MRN: [id number] Proc. Date: 01/03/00 Adm. Date:  [id number] Attending:  Darlin Priestly Dictator:   [phone] CC:         Casey [name], M.D.             Casey [name], M.D.             Casey [name], M.D.             Casey [name], R.N.                          Consultation Report  REASON FOR CONSULTATION: Evaluation of atrial flutter.  HISTORY OF PRESENT ILLNESS: The patient is a very pleasant 65 year old man with a history of thyroid disease and chronic lithium therapy.  He had atrial fibrillation back in September with anticoagulation at that time.  He was treated with Cardizem and digoxin and converted to sinus rhythm, and maintained this for several months before returning in atrial flutter.  The patient was treated with Sotalol.  He has had recurrent atrial flutter and is admitted to the hospital for discontinuation of Sotalol and initiation of amiodarone therapy.  However, because of his young age he was felt not to be a good candidate for chronic amiodarone therapy if other therapeutic options were available and is referred for additional evaluation.  The patient has no history of syncope, shortness of breath, or chest pain, although with exertion in atrial rhythm he does have decreased exertion and increased fatigue, particularly with exertion.  PAST MEDICAL HISTORY: As noted in the HPI.  SOCIAL HISTORY: The patient is married.  FAMILY HISTORY: Noncontributory.  REVIEW OF SYSTEMS: The patient denies any problems with arthritis and denies any neurologic changes.  He denies any recent weight changes.  He denies any problems with bowel or bladder.  PHYSICAL EXAMINATION:  GENERAL: He is a pleasant middle-aged man in no distress.  VITAL SIGNS: Blood pressure today was 140/80, pulse 80 and regular.  NECK: No jugular venous distention.  Carotids were 2+ and symmetric.   Thyroid not appreciably enlarged.  CARDIOVASCULAR: Irregular rhythm without murmurs, rubs, or gallops.  ABDOMEN: Soft, nontender.  EXTREMITIES: No peripheral edema.  LABORATORY DATA: EKG demonstrates atrial flutter with variable AV conduction at a rate of 70 beats per minute.  IMPRESSION:  1. Atrial fibrillation.  2. Recurrent atrial flutter on antiarrhythmic therapy.  3. Chronic Coumadin therapy.  RECOMMENDATIONS: At this time I have discussed the different treatment options with Casey [name].  I think catheter ablation of atrial flutter followed by treatment with low-dose beta-blockers plus or minus flecainide would be the best way to maintain him in normal sinus rhythm.  I have discussed the risks and benefits of catheter ablation with the patient and he will reflect on these and render an opinion on proceeding soon.  If he is in favor our of proceeding with catheter ablation then we would schedule this at the earliest possible time, realizing that he would have to be off of Coumadin therapy for several days prior to the ablation procedure. DD:  01/03/00 TD:  01/03/00 Job: [id number]

## 2010-11-30 NOTE — Discharge Summary (Signed)
[place]  Patient:    Casey [name], Casey [name]                       MRN: [id number] Adm. Date:  [id number] Disc. Date: 01/12/00 Attending:  Lewayne Bunting Dictator:   Dian Queen, P.A.C. [facility] CC:         Lenise Herald, M.D.                  Referring Physician Discharge [facility] COURSE:  Essentially, problem is that of a 65 year old white married male on chronic Lithium therapy with history of thyroid disease.  Atrial fibrillation onset in September.  Antiarrhythmic agents were attempted but sotalol failed.  Rather than beginning him on long-term amiodarone at his young age, decision was made to see if his flutter would be amenable to radiofrequency ablative therapy, and he was admitted for EP evaluation.  This was done by Dr. Ladona Ridgel without complication.  Successful flutter ablation was achieved.  This procedure was uncomplicated.  He was begun on Toprol XL 50 mg q.d.  We will re-Coumadinize him for six weeks, and if he maintains sinus rhythm, then perhaps Coumadin can be discontinued at that time.  FINAL DIAGNOSES: 1. Paroxysmal atrial fibrillation - sotalol failure - hospitalized this    admission for EP evaluation and successful radiofrequency ablation. 2. History of thyroid disease. 3. Chronic lithium therapy.  DISPOSITION:  Patient discharged home.  MEDICATIONS:  Lithium, Toprol XL 50 mg q.d., and Coumadin.  FOLLOW-UP:  He will get a pro time checked in three days and see Dr. Jenne Campus back in several weeks. DD:  01/12/00 TD:  01/12/00 Job: [id number]

## 2010-11-30 NOTE — Op Note (Signed)
NAME:  Casey [name], Casey [name]                ACCOUNT NO.:  [id number]   MEDICAL RECORD NO.:  [id number]          PATIENT TYPE:  AMB   LOCATION:  DAY                           FACILITY:  [facility]   PHYSICIAN:  R. Roetta Sessions, M.D. DATE OF BIRTH:  1945-07-29   DATE OF PROCEDURE:  01/29/2005  DATE OF DISCHARGE:                                 OPERATIVE REPORT   PROCEDURE:  Diagnostic colonoscopy.   INDICATIONS FOR PROCEDURE:  Patient is a 65 year old gentleman with episode  of hematochezia when he was on vacation in [place] nearly two months ago.  He was seen in the office on December 31, 2004, set up for colonoscopy. There is  a distant history of colonic polyps.  Since I saw him one month ago, he  states he has not had any blood per rectum or any GI symptoms.  Colonoscopy  is now being done.  The procedure was discussed with the patient at length,  potential risks, benefits, and alternatives, questions answered, agreeable.  See documentation in medical record.   PROCEDURE:  O2 saturation, blood pressure, pulse __________ monitored during  the entire procedure.  Conscious sedation, Versed 3 mg IV, Demerol 75 mg IV  in divided doses.   INSTRUMENTATION:  Olympus video chip system.   FINDINGS:  Digital rectal exam revealed no abnormalities.   ENDOSCOPIC FINDINGS:  Prep was good.   RECTUM:  Examination of the rectal mucosa:  Retroflexed view of the anal  verge revealed no abnormalities.   COLON:  Colonic mucosal surveyed  from the rectosigmoid junction to the left  transverse, right colon, appendiceal orifice, ileocecal valve and cecum.  These structures were well seen and photographed for the record.  Scope was  slowly withdrawn.  Previously mentioned __________.  The patient did not  have sigmoid diverticula.  Remainder of the colonic mucosa appeared normal.  Patient tolerated the procedure well.   IMPRESSION:  Normal rectum, left-sided diverticula and colonic mucosa.  Normal, not mentioned  above, terminal ileum was intubated 10 cm, segment GI  tract also appeared normal.   I suspect the patient had benign anorectal bleeding versus a diverticular  bleed in the setting of Coumadin therapy.  At any rate, today's findings are  reassuring.   RECOMMENDATIONS:  1.  Resume Coumadin today.  2.  Diverticulosis literature provided to [name].  3.  If he has any future bleeding, he is to let me know, otherwise plan for      surveillance colonoscopy in five years.       RMR/MEDQ  D:  01/29/2005  T:  01/29/2005  Job:  [id number]   cc:   Madelin Rear. Sherwood Gambler, MD  [address]  [place]  [place] [address]  Fax: [phone]

## 2010-11-30 NOTE — Discharge Summary (Signed)
[place]  Patient:    Casey [name], Casey [name]                       MRN: [id number] Adm. Date:  [id number] Disc. Date: [id number] Attending:  Lewayne Bunting Dictator:   Marya Fossa, P.A. CC:         Armanda Magic, M.D.             Lenise Herald, M.D.             Dr. __________             Rudene Christians. Ladona Ridgel, M.D. [facility]                           Discharge Summary  DATE OF BIRTH:  May 04, 1946.  [facility] MR #: [id number].  ADMISSION DIAGNOSES: 1. Recurrent atrial fibrillation/flutter on Sotalol. 2. Elective hospital admission for amiodarone load. 3. Chronic Coumadin therapy. 4. Bipolar disorder.  DISCHARGE DIAGNOSES: 1. Atrial fibrillation/flutter persists - status post evaluation by    Rudene Christians. Ladona Ridgel, M.D., electrophysiologist with recommendation for catheter    ablation instead of amiodarone therapy. 2. Elective hospital admission for amiodarone load. 3. Chronic Coumadin therapy. 4. Bipolar disorder.  HISTORY OF PRESENT ILLNESS:  Casey [name] is a 65 year old gentleman who has been followed by our practice for atrial fibrillation/flutter. He is formerly a patient of Dr. __________ and Armanda Magic, M.D., with a history of atrial fibrillation/flutter prompting admission in September of 2000 with anticoagulation.  At that time he was started on _______, digoxin and converted to normal sinus rhythm.  He maintained this for six months and Coumadin was discontinued.  He was readmitted in May for increasing fatigue and shortness of breath and was found to be back in atrial fibrillation at which time he underwent TE guided cardioversion with sotalol loading.  Since that time he has had intermittent episodes of atrial fibrillation on sotalol and has had increasing shortness of breath and fatigue and has been relatively intolerant to sotalol.  He has been maintained on Coumadin with a therapeutic INR.  In the office he returns with  increasing shortness of breath and fatigue and is fibrillation/flutter.  He had self-adjusted his sotalol to 40 mg b.i.d. secondary to intolerance.  He was subsequently started on Cardizem 60 t.i.d. for rate management.  We discussed options and he is in agreement with readmission to the hospital on January 02, 2000, for amiodarone loading.  Should he not convert spontaneously, he will need DC cardioversion prior to discharge.  PROCEDURES:  None.  CONSULTATIONS: 1. Rudene Christians. Ladona Ridgel, M.D., electrophysiologist for catheter ablation. 2. Psychiatry for bipolar disorder.  COMPLICATIONS:  None.  HOSPITAL COURSE:  The patient was admitted to [facility]. [facility] and EKG confirmed atrial flutter with a rate of 64.  Sotalol was discontinued. The patient was seen by Dr. Sharrell Ku for evaluation of atrial flutter and it was felt that due to his young age he was not a candidate for chronic amiodarone therapy.  After evaluation he recommended catheter ablation followed by treatment of low dose beta-blockers plus or minus flecainide as optimal.  The patient is in agreement to this.  Additionally, the patient showed an interest in getting off his lithium and Tapazole and therefore we had a psychiatrist evaluate the patient while he was here.  They diagnosed the patient with bipolar disorder in remission.  Apparently the risks and benefits of being on lithium therapy versus Depakote were reviewed with the patient and Dr. Tommy Medal recommendation was that the patient remain on a mood stabilizer for the rest of his life.  The patient understood this information and wanted to continue lithium at its lowest possible dose to maintain mood stability.  Apparently the patient declined further psychiatric follow-up and wanted to continue with his primary care Tamaira Ciriello for therapy.  Arrangements were made for catheter ablation for Friday, January 11, 2000. Coumadin was held and the patient was  discharged home on aspirin therapy.  He will be brought back to Dr. Bruna Potter service for the procedure.  DISPOSITION:  The patient will be discharged to home on January 04, 2000.  DISCHARGE MEDICATIONS: 1. Aspirin 325 mg a day. 2. Tapazole 5 mg every other day. 3. Lithium 300 mg t.i.d. 4. Cardizem 60 mg q.8h.  ACTIVITY:  He will perform activity as tolerated.  He will be contacted by Nathen May, M.D., [facility], nurse for details in regard to ablation and admission on Friday. DD:  01/24/00 TD:  01/24/00 Job: [id number]

## 2010-11-30 NOTE — Procedures (Signed)
[place]. [facility]  Patient:    Casey [name], Casey [name]                       MRN: [id number] Proc. Date: 01/11/00 Adm. Date:  [id number] Attending:  Lewayne Bunting CC:         Richard A. Alanda Amass, M.D.             Kathrine Cords, R.N., [facility]                           Procedure Report  PROCEDURE PERFORMED: Electrophysiologic study and atrial flutter ablation.  REFERRING PHYSICIAN: Dr. Susa Griffins.  I. INTRODUCTION: The patient is a 65 year old male with a long history of atrial fibrillation and most recently atrial flutter.  He is admitted to the hospital for a catheter ablation.  II. PROCEDURE:  After informed consent was obtained, the patient was taken to the diagnostic EP lab in a fasting state.  After the usual preparation and draping, intravenous fentanyl and midazolam were given for sedation.  A 6 French hexapolar catheter was inserted percutaneously in the right jugular vein and advanced to coronary sinus.  A 20 pole 7 French Halo catheter was inserted percutaneously in the right femoral vein and advanced to the right atrium.  A 5 French quadripolar catheter was inserted percutaneously in the right femoral vein and advanced to the His bundle region.  A 7 French quadripolar ablation catheter was inserted percutaneously in the right femoral vein and advanced to the right atrium.  At baseline the patient was in atrial flutter with variable AV conduction.  The H-V interval was 63 msec and the atrial flutter cycle length was 226 msec.  The mapping of the patients atrial flutter demonstrated typical counterclockwise tricuspid annular rentry.  The earliest left atrial activation was in the proximal coronary sinus electrode. With the typical atrial flutter present, the ablation catheter was maneuvered over into the septal leaflet of the tricuspid valve and 9 RF energy applications were delivered.  This resulted in termination of atrial flutter.  Following this, there was evidence of atrial flutter isthmus conduction.  A total of 11 additional RF energy application were then delivered and this resulted in the creation of block in the atrial flutter isthmus.  At this point, the patient was observed for about 10 minutes and there was no additional conduction in the atrial flutter isthmus.  The catheter was then removed, hemostasis was assured, and the patient returned to his room in good condition.  III. COMPLICATIONS:  None.  IV. RESULTS:     A. Baseline 12-lead ECG:  The baseline 12-lead ECG demonstrates atrial        flutter with variable AV conduction.     B. Baseline intervals:  The atrial flutter cycle length was 226 msec.  The        H-V interval was 63 msec.  The QRS duration was 86 msec.  Following        termination of atrial flutter, the sinus node cycle length was        873 msec, the P-R interval of 174 msec, and QRS duration 86 msec.     C. Rapid atrial pacing:  Rapid atrial pacing demonstrated A-V Wenckebach        at a cycle length at 500 msec.  In addition, rapid atrial pacing        demonstrated isthmus block.  D. Arrhythmia observed:        1. Atrial flutter initiation present at the time of EP study.  Duration           sustained.  Termination with the catheter ablation, with a cycle           length of 226 msec.     E. Mapping:  Mapping of the patients atrial flutter demonstrated typical        counterclockwise tricuspid annular reentry.     F. RF energy application:  A total of 23 RF energy applications were        delivered to the atrial flutter isthmus with atrial flutter terminated        on the 10th RF energy application.  Following this, a total of 13        additional RF energy applications were delivered with resultant        creation of bidirectional block in the atrial flutter isthmus.  CONCLUSION:  This demonstrates typical atrial flutter which was terminated with catheter ablation of the  atrial flutter isthmus, restoring sinus rhythm and creating bidirectional block in the atrial flutter isthmus.  There were no immediate procedural complications. DD:  01/11/00 TD:  01/12/00 Job: [id number]

## 2010-11-30 NOTE — Discharge Summary (Signed)
[place]. [facility]  Patient:    Casey [name], Casey [name]                       MRN: [id number] Adm. Date:  [id number] Disc. Date: 11/26/99 Attending:  Armanda Magic Dictator:   [facility] CC:         Lenise Herald, M.D., [facility]             Armanda Magic, M.D., [facility]             Luciana Axe, M.D., [place], [place]                           Discharge Summary  ADMISSION DIAGNOSES: 1. Atrial fibrillation/flutter. 2. Borderline hypertension. 3. Status post cataract removal in 1995. 4. History of manic depressive disorder. 5. History of prostatitis. 6. History of hypothyroidism.  DISCHARGE DIAGNOSES:  Same as above.  Also, status post TEE and DC cardioversion on 11/21/99 by Dr. Lenise Herald.  Also, status post sotalol loading.  Normal sinus  rhythm at discharge.  HISTORY OF PRESENT ILLNESS:  Mr. Casey [name] is a 65 year old white male patient [place] Clover [place] and Dr. Armanda Magic.  He has a history of atrial fibrillation and atrial flutter prompting admission to the hospital in 9/00 with anticoagulation.  At that time he was controlled with p.o. diltiazem, as well as digoxin and converted back to normal sinus rhythm. Additionally, he has a history of borderline hypertension, status post cataract  removal in 1995, history of manic depressive disorder, diagnosed in 1996 for which he takes Lithium.  He also has a history of prostatitis.  Additionally, he has  history of hypothyroidism.  He presented to the office on 11/20/99 to see Dr. Jenne Campus.  At that tiem, he had noticed that over the past several weeks, he had increased fatigue and mild dyspnea on exertion.  He last saw Dr. Mayford Knife in January when he was noted to be in normal sinus rhythm and had maintained this for the last six months, and his Coumadin ad been discontinued at that time.  He  returned to the office on 11/20/99 complaining of increased fatigue and mild lower extremity edema.  As noted, he was in atrial flutter with an atrial rate of approximately 200 and a ventricular rate of 68. He had been off of his anticoagulation now for two months.  He did not remember his last digoxin level or TSH.  He denied having any chest pain.  He had no heart failure symptoms.  He had no syncope or presyncope.   He had had no focal neurologic findings suggestive of cerebrovascular accident or transient ischemic attack.  At that time, he was planned to be readmitted for IV heparin, Coumadin  reloading, and a probable TEE-guided DC cardioversion with probable loading of sotalol.  HOSPITAL COURSE:  On 11/21/99, [name] underwent TEE-guided DC cardioversion y Dr. Lenise Herald.  TEE revealed normal left ventricular function with ejection fraction 60%.  He had structurally normal aortic valve, which revealed AI. Structurally normal mitral valve with trivial to mild MR.  Structurally normal tricuspid valve with trivial TR.  There was no evidence of any cardiac thrombus or mass.  Following that, one shock of 300 joules was then delivered and the patient was converted to normal sinus rhythm without complications.  On 11/22/99, [name]  was maintaining normal sinus rhythm.  At this time, we ad started loading Coumadin at 10 mg p.o. q. day.  In the meantime, he was continued on IV heparin until greater than or equal to 2.0.  At that time, he was on diltiazem and digoxin and he was also started on sotalol 40 mg b.i.d.  On 11/23/99, [name] was continuing to maintain normal sinus rhythm and was hemodynamically stable.  However, it was felt that his sotalol needed to be at n increased dose for further antiarrhythmic quality.  Therefore at that time, digoxin was stopped.  We planned to stop the Cardizem the following day.   His sotalol as increased to 80 mg b.i.d. at that  point.  On 11/24/99, [name] remained hemodynamically stable on the increased sotalol dose.  We planned to stop the Cardizem at that time.  At this point, his INR was only 1.5, so he continued to be loaded on Coumadin and heparin.  On 11/25/99, his INR was still 1.7.  We continued IV heparin while loading Coumadin. No other adjustments were made in medications.  We did offer for the patient to be discharged home with Lovenox subcu. injections while awaiting therapeutic INR, ut he did not want to do this.  On 11/26/99, his INR was finally therapeutic at 2.2.  As well, he is hemodynamically stable with blood pressure 135/75, pulse 85.  Oxygen saturations 96% on room air. Telemetry revealed sinus bradycardia at 49 beats per minute.  He does have some  complaints of fatigue and sluggishness this morning, but otherwise is stable. is BMET and CBC are normal.  Lungs are clear.  Heart is in regular rhythm and he has no edema.  His EKG has been reviewed and the QTC is within normal limits.  His NR is therapeutic.  He is felt to be stable for discharge home at this time.  HOSPITAL CONSULTS:  None.  HOSPITAL PROCEDURES:  TEE-guided DC cardioversion by Dr. Lenise Herald on 11/21/99.  HOSPITAL LABS:  CBC is normal with WBC 8.1, hemoglobin 13.1, hematocrit 38.2, platelet count 133,000.  BMP is also normal with sodium 137, potassium 4.0, glucose 134, BUN 11, creatinine 1.0.  Cardiac enzymes are negative with CKs 121 and 95,  CK/MBs 1.8 and 1.4, relative index 1.5 and 1.5, troponin I less than 0.03.  TSH is normal at 2.00.  Free T4 normal at 0.97, free T3 normal at 2.9.  INRs were as follows:  Ranging from 11/21/99 through 11/26/99, the INRs were 1.2, 1.2, 1.3, 1.5, 1.7, and 2.2 at time of discharge.  EKG on 11/26/99 revealed sinus bradycardia and acute TC, is within normal limits per Dr. Mayford Knife.  DISCHARGE MEDICATIONS: 1. Sotalol/Betapace 80 mg twice a day. 2. Tapazole 5 mg one  every other day. 3. Lithium carbonate 300 mg three times a day. 4. Coumadin 7.5 mg (1 1/2 of the 5 mg tablets) every day except 10 mg (2 of the 5    mg tablets) on Saturdays.   Low or no caffeine diet.  Low salt, low fat.  Have blood drawn on Thursday, 11/29/99 to check a PT/INR.  Follow up in [place] office with Dr. Lenise Herald on 12/11/99 at 11:30 a.m.  DD:  11/26/99 TD:  11/26/99 Job: [id number]

## 2011-12-11 DIAGNOSIS — Z961 Presence of intraocular lens: Secondary | ICD-10-CM | POA: Diagnosis not present

## 2011-12-11 DIAGNOSIS — H353190 Nonexudative age-related macular degeneration, unspecified eye, stage unspecified: Secondary | ICD-10-CM | POA: Diagnosis not present

## 2011-12-11 DIAGNOSIS — H35039 Hypertensive retinopathy, unspecified eye: Secondary | ICD-10-CM | POA: Diagnosis not present

## 2011-12-11 DIAGNOSIS — H35379 Puckering of macula, unspecified eye: Secondary | ICD-10-CM | POA: Diagnosis not present

## 2012-01-10 ENCOUNTER — Other Ambulatory Visit: Payer: Self-pay | Admitting: Urology

## 2012-01-10 ENCOUNTER — Ambulatory Visit (HOSPITAL_COMMUNITY)
Admission: RE | Admit: 2012-01-10 | Discharge: 2012-01-10 | Disposition: A | Discharge status: Home / Self Care | Payer: Medicare Other | Source: Ambulatory Visit | Attending: Urology | Admitting: Urology

## 2012-01-10 ENCOUNTER — Ambulatory Visit (INDEPENDENT_AMBULATORY_CARE_PROVIDER_SITE_OTHER): Payer: Medicare Other | Admitting: Urology

## 2012-01-10 DIAGNOSIS — N200 Calculus of kidney: Secondary | ICD-10-CM | POA: Insufficient documentation

## 2012-01-10 DIAGNOSIS — R109 Unspecified abdominal pain: Secondary | ICD-10-CM | POA: Insufficient documentation

## 2012-01-10 DIAGNOSIS — N400 Enlarged prostate without lower urinary tract symptoms: Secondary | ICD-10-CM

## 2012-01-10 DIAGNOSIS — IMO0002 Reserved for concepts with insufficient information to code with codable children: Secondary | ICD-10-CM

## 2012-04-10 ENCOUNTER — Ambulatory Visit (INDEPENDENT_AMBULATORY_CARE_PROVIDER_SITE_OTHER): Payer: Medicare Other | Admitting: Urology

## 2012-04-10 DIAGNOSIS — N200 Calculus of kidney: Secondary | ICD-10-CM

## 2012-04-10 DIAGNOSIS — IMO0002 Reserved for concepts with insufficient information to code with codable children: Secondary | ICD-10-CM

## 2012-08-25 DIAGNOSIS — F319 Bipolar disorder, unspecified: Secondary | ICD-10-CM | POA: Diagnosis not present

## 2012-09-01 DIAGNOSIS — Z6831 Body mass index (BMI) 31.0-31.9, adult: Secondary | ICD-10-CM | POA: Diagnosis not present

## 2012-09-01 DIAGNOSIS — Z7901 Long term (current) use of anticoagulants: Secondary | ICD-10-CM | POA: Diagnosis not present

## 2012-09-01 DIAGNOSIS — E039 Hypothyroidism, unspecified: Secondary | ICD-10-CM | POA: Diagnosis not present

## 2012-09-01 DIAGNOSIS — F329 Major depressive disorder, single episode, unspecified: Secondary | ICD-10-CM | POA: Diagnosis not present

## 2012-09-01 DIAGNOSIS — I10 Essential (primary) hypertension: Secondary | ICD-10-CM | POA: Diagnosis not present

## 2012-09-28 DIAGNOSIS — F319 Bipolar disorder, unspecified: Secondary | ICD-10-CM | POA: Diagnosis not present

## 2012-10-12 DIAGNOSIS — F319 Bipolar disorder, unspecified: Secondary | ICD-10-CM | POA: Diagnosis not present

## 2012-10-13 DIAGNOSIS — F329 Major depressive disorder, single episode, unspecified: Secondary | ICD-10-CM | POA: Diagnosis not present

## 2012-10-13 DIAGNOSIS — N183 Chronic kidney disease, stage 3 unspecified: Secondary | ICD-10-CM | POA: Diagnosis not present

## 2012-10-13 DIAGNOSIS — E785 Hyperlipidemia, unspecified: Secondary | ICD-10-CM | POA: Diagnosis not present

## 2012-10-13 DIAGNOSIS — Z6831 Body mass index (BMI) 31.0-31.9, adult: Secondary | ICD-10-CM | POA: Diagnosis not present

## 2012-10-13 DIAGNOSIS — R7301 Impaired fasting glucose: Secondary | ICD-10-CM | POA: Diagnosis not present

## 2012-10-13 DIAGNOSIS — I10 Essential (primary) hypertension: Secondary | ICD-10-CM | POA: Diagnosis not present

## 2012-10-13 DIAGNOSIS — Z7901 Long term (current) use of anticoagulants: Secondary | ICD-10-CM | POA: Diagnosis not present

## 2012-10-13 DIAGNOSIS — E039 Hypothyroidism, unspecified: Secondary | ICD-10-CM | POA: Diagnosis not present

## 2012-10-14 DIAGNOSIS — I4891 Unspecified atrial fibrillation: Secondary | ICD-10-CM | POA: Diagnosis not present

## 2012-10-14 DIAGNOSIS — R0602 Shortness of breath: Secondary | ICD-10-CM | POA: Diagnosis not present

## 2012-10-19 DIAGNOSIS — N183 Chronic kidney disease, stage 3 unspecified: Secondary | ICD-10-CM | POA: Diagnosis not present

## 2012-10-19 DIAGNOSIS — Z6831 Body mass index (BMI) 31.0-31.9, adult: Secondary | ICD-10-CM | POA: Diagnosis not present

## 2012-11-02 ENCOUNTER — Encounter: Payer: Self-pay | Admitting: *Deleted

## 2012-11-02 DIAGNOSIS — Z6831 Body mass index (BMI) 31.0-31.9, adult: Secondary | ICD-10-CM | POA: Diagnosis not present

## 2012-11-02 DIAGNOSIS — N183 Chronic kidney disease, stage 3 unspecified: Secondary | ICD-10-CM | POA: Diagnosis not present

## 2012-11-02 DIAGNOSIS — Z7901 Long term (current) use of anticoagulants: Secondary | ICD-10-CM | POA: Diagnosis not present

## 2012-11-03 DIAGNOSIS — F319 Bipolar disorder, unspecified: Secondary | ICD-10-CM | POA: Diagnosis not present

## 2012-11-04 ENCOUNTER — Other Ambulatory Visit (HOSPITAL_COMMUNITY): Payer: Self-pay | Admitting: Cardiovascular Disease

## 2012-11-04 DIAGNOSIS — R0602 Shortness of breath: Secondary | ICD-10-CM

## 2012-11-04 DIAGNOSIS — I4891 Unspecified atrial fibrillation: Secondary | ICD-10-CM

## 2012-11-16 ENCOUNTER — Ambulatory Visit (HOSPITAL_COMMUNITY)
Admission: RE | Admit: 2012-11-16 | Discharge: 2012-11-16 | Disposition: A | Discharge status: Home / Self Care | Payer: Medicare Other | Source: Ambulatory Visit | Attending: Cardiovascular Disease | Admitting: Cardiovascular Disease

## 2012-11-16 DIAGNOSIS — I4891 Unspecified atrial fibrillation: Secondary | ICD-10-CM | POA: Diagnosis not present

## 2012-11-16 DIAGNOSIS — R0602 Shortness of breath: Secondary | ICD-10-CM

## 2012-11-16 DIAGNOSIS — I10 Essential (primary) hypertension: Secondary | ICD-10-CM | POA: Insufficient documentation

## 2012-11-16 NOTE — Progress Notes (Signed)
2D Echo Performed 11/16/2012    Dmarco Baldus, RCS  

## 2012-11-24 DIAGNOSIS — Z7901 Long term (current) use of anticoagulants: Secondary | ICD-10-CM | POA: Diagnosis not present

## 2012-11-24 DIAGNOSIS — N183 Chronic kidney disease, stage 3 unspecified: Secondary | ICD-10-CM | POA: Diagnosis not present

## 2012-11-24 DIAGNOSIS — Z6831 Body mass index (BMI) 31.0-31.9, adult: Secondary | ICD-10-CM | POA: Diagnosis not present

## 2012-11-24 DIAGNOSIS — I10 Essential (primary) hypertension: Secondary | ICD-10-CM | POA: Diagnosis not present

## 2012-11-24 DIAGNOSIS — F319 Bipolar disorder, unspecified: Secondary | ICD-10-CM | POA: Diagnosis not present

## 2012-12-02 ENCOUNTER — Telehealth: Payer: Self-pay | Admitting: *Deleted

## 2012-12-02 NOTE — Telephone Encounter (Signed)
Results of Echo called to Pt. - no change from previous echo

## 2012-12-08 DIAGNOSIS — Z7901 Long term (current) use of anticoagulants: Secondary | ICD-10-CM | POA: Diagnosis not present

## 2012-12-18 DIAGNOSIS — Z7901 Long term (current) use of anticoagulants: Secondary | ICD-10-CM | POA: Diagnosis not present

## 2012-12-22 DIAGNOSIS — F319 Bipolar disorder, unspecified: Secondary | ICD-10-CM | POA: Diagnosis not present

## 2012-12-28 DIAGNOSIS — H60399 Other infective otitis externa, unspecified ear: Secondary | ICD-10-CM | POA: Diagnosis not present

## 2012-12-28 DIAGNOSIS — N183 Chronic kidney disease, stage 3 unspecified: Secondary | ICD-10-CM | POA: Diagnosis not present

## 2012-12-28 DIAGNOSIS — Z6832 Body mass index (BMI) 32.0-32.9, adult: Secondary | ICD-10-CM | POA: Diagnosis not present

## 2012-12-28 DIAGNOSIS — H6690 Otitis media, unspecified, unspecified ear: Secondary | ICD-10-CM | POA: Diagnosis not present

## 2013-01-04 DIAGNOSIS — E039 Hypothyroidism, unspecified: Secondary | ICD-10-CM | POA: Diagnosis not present

## 2013-01-04 DIAGNOSIS — I10 Essential (primary) hypertension: Secondary | ICD-10-CM | POA: Diagnosis not present

## 2013-01-04 DIAGNOSIS — Z6832 Body mass index (BMI) 32.0-32.9, adult: Secondary | ICD-10-CM | POA: Diagnosis not present

## 2013-01-04 DIAGNOSIS — N183 Chronic kidney disease, stage 3 unspecified: Secondary | ICD-10-CM | POA: Diagnosis not present

## 2013-01-12 DIAGNOSIS — F319 Bipolar disorder, unspecified: Secondary | ICD-10-CM | POA: Diagnosis not present

## 2013-02-01 DIAGNOSIS — Z7901 Long term (current) use of anticoagulants: Secondary | ICD-10-CM | POA: Diagnosis not present

## 2013-02-03 DIAGNOSIS — Z7901 Long term (current) use of anticoagulants: Secondary | ICD-10-CM | POA: Diagnosis not present

## 2013-02-15 DIAGNOSIS — Z6831 Body mass index (BMI) 31.0-31.9, adult: Secondary | ICD-10-CM | POA: Diagnosis not present

## 2013-02-15 DIAGNOSIS — Z7901 Long term (current) use of anticoagulants: Secondary | ICD-10-CM | POA: Diagnosis not present

## 2013-02-15 DIAGNOSIS — E039 Hypothyroidism, unspecified: Secondary | ICD-10-CM | POA: Diagnosis not present

## 2013-02-16 DIAGNOSIS — F319 Bipolar disorder, unspecified: Secondary | ICD-10-CM | POA: Diagnosis not present

## 2013-02-24 DIAGNOSIS — Z7901 Long term (current) use of anticoagulants: Secondary | ICD-10-CM | POA: Diagnosis not present

## 2013-03-03 DIAGNOSIS — Z7901 Long term (current) use of anticoagulants: Secondary | ICD-10-CM | POA: Diagnosis not present

## 2013-03-05 DIAGNOSIS — N2581 Secondary hyperparathyroidism of renal origin: Secondary | ICD-10-CM | POA: Diagnosis not present

## 2013-03-05 DIAGNOSIS — I129 Hypertensive chronic kidney disease with stage 1 through stage 4 chronic kidney disease, or unspecified chronic kidney disease: Secondary | ICD-10-CM | POA: Diagnosis not present

## 2013-03-05 DIAGNOSIS — I10 Essential (primary) hypertension: Secondary | ICD-10-CM | POA: Diagnosis not present

## 2013-03-05 DIAGNOSIS — D649 Anemia, unspecified: Secondary | ICD-10-CM | POA: Diagnosis not present

## 2013-03-05 DIAGNOSIS — N183 Chronic kidney disease, stage 3 unspecified: Secondary | ICD-10-CM | POA: Diagnosis not present

## 2013-03-05 DIAGNOSIS — E213 Hyperparathyroidism, unspecified: Secondary | ICD-10-CM | POA: Diagnosis not present

## 2013-03-09 DIAGNOSIS — I10 Essential (primary) hypertension: Secondary | ICD-10-CM | POA: Diagnosis not present

## 2013-03-09 DIAGNOSIS — D649 Anemia, unspecified: Secondary | ICD-10-CM | POA: Diagnosis not present

## 2013-03-09 DIAGNOSIS — E213 Hyperparathyroidism, unspecified: Secondary | ICD-10-CM | POA: Diagnosis not present

## 2013-03-09 DIAGNOSIS — N183 Chronic kidney disease, stage 3 unspecified: Secondary | ICD-10-CM | POA: Diagnosis not present

## 2013-03-16 DIAGNOSIS — Z7901 Long term (current) use of anticoagulants: Secondary | ICD-10-CM | POA: Diagnosis not present

## 2013-03-17 DIAGNOSIS — F319 Bipolar disorder, unspecified: Secondary | ICD-10-CM | POA: Diagnosis not present

## 2013-03-25 DIAGNOSIS — N200 Calculus of kidney: Secondary | ICD-10-CM | POA: Diagnosis not present

## 2013-03-25 DIAGNOSIS — N183 Chronic kidney disease, stage 3 unspecified: Secondary | ICD-10-CM | POA: Diagnosis not present

## 2013-03-25 DIAGNOSIS — I120 Hypertensive chronic kidney disease with stage 5 chronic kidney disease or end stage renal disease: Secondary | ICD-10-CM | POA: Diagnosis not present

## 2013-04-06 DIAGNOSIS — N182 Chronic kidney disease, stage 2 (mild): Secondary | ICD-10-CM | POA: Diagnosis not present

## 2013-04-06 DIAGNOSIS — Z23 Encounter for immunization: Secondary | ICD-10-CM | POA: Diagnosis not present

## 2013-04-06 DIAGNOSIS — I129 Hypertensive chronic kidney disease with stage 1 through stage 4 chronic kidney disease, or unspecified chronic kidney disease: Secondary | ICD-10-CM | POA: Diagnosis not present

## 2013-04-06 DIAGNOSIS — F309 Manic episode, unspecified: Secondary | ICD-10-CM | POA: Diagnosis not present

## 2013-04-06 DIAGNOSIS — N200 Calculus of kidney: Secondary | ICD-10-CM | POA: Diagnosis not present

## 2013-04-08 DIAGNOSIS — Z7901 Long term (current) use of anticoagulants: Secondary | ICD-10-CM | POA: Diagnosis not present

## 2013-04-15 DIAGNOSIS — F319 Bipolar disorder, unspecified: Secondary | ICD-10-CM | POA: Diagnosis not present

## 2013-04-26 DIAGNOSIS — R030 Elevated blood-pressure reading, without diagnosis of hypertension: Secondary | ICD-10-CM | POA: Diagnosis not present

## 2013-04-26 DIAGNOSIS — Z6830 Body mass index (BMI) 30.0-30.9, adult: Secondary | ICD-10-CM | POA: Diagnosis not present

## 2013-04-26 DIAGNOSIS — Z79899 Other long term (current) drug therapy: Secondary | ICD-10-CM | POA: Diagnosis not present

## 2013-04-26 DIAGNOSIS — E785 Hyperlipidemia, unspecified: Secondary | ICD-10-CM | POA: Diagnosis not present

## 2013-04-26 DIAGNOSIS — E039 Hypothyroidism, unspecified: Secondary | ICD-10-CM | POA: Diagnosis not present

## 2013-04-28 ENCOUNTER — Encounter: Payer: Self-pay | Admitting: Cardiovascular Disease

## 2013-04-30 DIAGNOSIS — F319 Bipolar disorder, unspecified: Secondary | ICD-10-CM | POA: Diagnosis not present

## 2013-05-04 ENCOUNTER — Encounter: Payer: Self-pay | Admitting: Cardiovascular Disease

## 2013-05-04 ENCOUNTER — Ambulatory Visit (INDEPENDENT_AMBULATORY_CARE_PROVIDER_SITE_OTHER): Payer: Medicare Other | Admitting: Cardiovascular Disease

## 2013-05-04 VITALS — BP 112/60 | HR 85 | Resp 16 | Ht 77.0 in | Wt 220.5 lb

## 2013-05-04 DIAGNOSIS — E785 Hyperlipidemia, unspecified: Secondary | ICD-10-CM | POA: Diagnosis not present

## 2013-05-04 DIAGNOSIS — I4891 Unspecified atrial fibrillation: Secondary | ICD-10-CM | POA: Diagnosis not present

## 2013-05-04 DIAGNOSIS — I10 Essential (primary) hypertension: Secondary | ICD-10-CM | POA: Diagnosis not present

## 2013-05-04 NOTE — Patient Instructions (Addendum)
Your physician recommends that you schedule a follow-up appointment in: 12 months.  

## 2013-05-10 ENCOUNTER — Other Ambulatory Visit (HOSPITAL_COMMUNITY): Payer: Self-pay | Admitting: Family Medicine

## 2013-05-10 ENCOUNTER — Ambulatory Visit (HOSPITAL_COMMUNITY)
Admission: RE | Admit: 2013-05-10 | Discharge: 2013-05-10 | Disposition: A | Discharge status: Home / Self Care | Payer: Medicare Other | Source: Ambulatory Visit | Attending: Family Medicine | Admitting: Family Medicine

## 2013-05-10 DIAGNOSIS — R51 Headache: Secondary | ICD-10-CM

## 2013-05-10 DIAGNOSIS — Z681 Body mass index (BMI) 19 or less, adult: Secondary | ICD-10-CM | POA: Diagnosis not present

## 2013-05-16 ENCOUNTER — Encounter: Payer: Self-pay | Admitting: Cardiovascular Disease

## 2013-05-16 DIAGNOSIS — E785 Hyperlipidemia, unspecified: Secondary | ICD-10-CM | POA: Insufficient documentation

## 2013-05-16 NOTE — Assessment & Plan Note (Signed)
April 2014 total cholesterol 212, triglyceride 99, HDL 42, LDL 150. Compliance with statin medication at that time was poor. Repeat a lipid profile.

## 2013-05-16 NOTE — Progress Notes (Signed)
Patient ID: Casey [name], male   DOB: Nov 29, 1945, 67 y.o.   MRN: [id number]      Reason for office visit Atrial fibrillation  [place] has permanent atrial fibrillation that is essentially asymptomatic. We have tried several cardioversion and different antiarrhythmic indications without success.  He is emerging from a prolonged episode of depression as part of his bipolar disorder. During that time compliance with statin medication was poor. He was eating poorly and lost substantial weight. He is now gaining weight back and is much more engaged and positive.  He does not have any cardiac complaints   Allergies  Allergen Reactions  . Amlodipine     Current Outpatient Prescriptions  Medication Sig Dispense Refill  . cholecalciferol (VITAMIN D) 1000 UNITS tablet Take 2,000 Units by mouth daily.      Marland Kitchen levothyroxine (SYNTHROID, LEVOTHROID) 75 MCG tablet Take 25 mcg by mouth daily before breakfast.       . oxcarbazepine (TRILEPTAL) 600 MG tablet Take 600 mg by mouth 2 (two) times daily.       . pravastatin (PRAVACHOL) 40 MG tablet Take 40 mg by mouth daily.      . sertraline (ZOLOFT) 100 MG tablet Take 100 mg by mouth daily.      Marland Kitchen warfarin (COUMADIN) 5 MG tablet Take 5 mg by mouth daily. AS PER INR       No current facility-administered medications for this visit.    Past Medical History  Diagnosis Date  . Atrial fibrillation 01/12/2010    2D Echo EF=>55%  . Depression   . Hyperlipidemia   . Hypertension   . Bipolar disorder   . Morbid obesity   . Hypothyroidism   . Prostatitis   . OSA (obstructive sleep apnea) 10/24/2009    AHI was 27.62hr RDI was 34.3 hr REM 0.00hr     Past Surgical History  Procedure Laterality Date  . Colonoscopy    . Tonsillectomy  1955  . Appendectomy  1959  . Cataract extraction  1995    Family History  Problem Relation Age of Onset  . Diabetes Mother   . Cancer Paternal Grandmother   . Heart failure Mother   . Hypertension Mother   .  Hypertension Father     History   Social History  . Marital Status: Married    Spouse Name: N/A    Number of Children: N/A  . Years of Education: N/A   Occupational History  . Not on file.   Social History Main Topics  . Smoking status: Never Smoker   . Smokeless tobacco: Not on file  . Alcohol Use: No  . Drug Use: No  . Sexual Activity: Not on file   Other Topics Concern  . Not on file   Social History Narrative  . No narrative on file    Review of systems: The patient specifically denies any chest pain at rest or with exertion, dyspnea at rest or with exertion, orthopnea, paroxysmal nocturnal dyspnea, syncope, palpitations, focal neurological deficits, intermittent claudication, lower extremity edema, unexplained weight gain, cough, hemoptysis or wheezing.  The patient also denies abdominal pain, nausea, vomiting, dysphagia, diarrhea, constipation, polyuria, polydipsia, dysuria, hematuria, frequency, urgency, abnormal bleeding or bruising, fever, chills, unexpected weight changes, mood swings, change in skin or hair texture, change in voice quality, auditory or visual problems, allergic reactions or rashes, new musculoskeletal complaints other than usual "aches and pains".   PHYSICAL EXAM BP 112/60  Pulse 85  Resp 16  Ht  6\' 5"  (1.956 m)  Wt 220 lb 8 oz (100.018 kg)  BMI 26.14 kg/m2  General: Alert, oriented x3, no distress Head: no evidence of trauma, PERRL, EOMI, no exophtalmos or lid lag, no myxedema, no xanthelasma; normal ears, nose and oropharynx Neck: normal jugular venous pulsations and no hepatojugular reflux; brisk carotid pulses without delay and no carotid bruits Chest: clear to auscultation, no signs of consolidation by percussion or palpation, normal fremitus, symmetrical and full respiratory excursions Cardiovascular: normal position and quality of the apical impulse, irregular rhythm, normal first and second heart sounds, no murmurs, rubs or  gallops Abdomen: no tenderness or distention, no masses by palpation, no abnormal pulsatility or arterial bruits, normal bowel sounds, no hepatosplenomegaly Extremities: no clubbing, cyanosis or edema; 2+ radial, ulnar and brachial pulses bilaterally; 2+ right femoral, posterior tibial and dorsalis pedis pulses; 2+ left femoral, posterior tibial and dorsalis pedis pulses; no subclavian or femoral bruits Neurological: grossly nonfocal   EKG: Atrial fibrillation otherwise normal  Lipid Panel  See below  BMET April 2014 creatinine 1.47 normal LFTs normal electrolytes normal CBC Creatinine in April 2013 was normal at 1.0  ASSESSMENT AND PLAN FIBRILLATION, ATRIAL Permanent atrial fibrillation, status post multiple failed attempts at cardioversion and antiarrhythmic therapy, with good rate control on appropriate anticoagulation therapy. He has not had any bleeding complications or embolic events. He does not have significant structural heart disease other than a moderately dilated left atrium. Continue current management  HYPERTENSION Excellent control. Substantial weight loss that occurred as a result of depression seems to be reversing. He is encouraged to try not to put on too much weight.  Hyperlipidemia LDL goal <130 April 2014 total cholesterol 212, triglyceride 99, HDL 42, LDL 150. Compliance with statin medication at that time was poor. Repeat a lipid profile.   Orders Placed This Encounter  Procedures  . EKG 12-Lead   Meds ordered this encounter  Medications  . sertraline (ZOLOFT) 100 MG tablet    Sig: Take 100 mg by mouth daily.  . cholecalciferol (VITAMIN D) 1000 UNITS tablet    Sig: Take 2,000 Units by mouth daily.    Junious Silk, MD, [facility] [phone] office [phone] pager

## 2013-05-16 NOTE — Assessment & Plan Note (Signed)
Excellent control. Substantial weight loss that occurred as a result of depression seems to be reversing. He is encouraged to try not to put on too much weight.

## 2013-05-16 NOTE — Assessment & Plan Note (Signed)
Permanent atrial fibrillation, status post multiple failed attempts at cardioversion and antiarrhythmic therapy, with good rate control on appropriate anticoagulation therapy. He has not had any bleeding complications or embolic events. He does not have significant structural heart disease other than a moderately dilated left atrium. Continue current management

## 2013-05-19 DIAGNOSIS — F319 Bipolar disorder, unspecified: Secondary | ICD-10-CM | POA: Diagnosis not present

## 2013-05-28 ENCOUNTER — Ambulatory Visit: Payer: Medicare Other | Admitting: Urology

## 2013-06-09 DIAGNOSIS — F319 Bipolar disorder, unspecified: Secondary | ICD-10-CM | POA: Diagnosis not present

## 2013-06-30 DIAGNOSIS — F319 Bipolar disorder, unspecified: Secondary | ICD-10-CM | POA: Diagnosis not present

## 2013-07-13 DIAGNOSIS — Z7901 Long term (current) use of anticoagulants: Secondary | ICD-10-CM | POA: Diagnosis not present

## 2013-07-23 ENCOUNTER — Ambulatory Visit (INDEPENDENT_AMBULATORY_CARE_PROVIDER_SITE_OTHER): Payer: Medicare Other | Admitting: Urology

## 2013-07-23 DIAGNOSIS — Z87442 Personal history of urinary calculi: Secondary | ICD-10-CM | POA: Diagnosis not present

## 2013-07-23 DIAGNOSIS — IMO0002 Reserved for concepts with insufficient information to code with codable children: Secondary | ICD-10-CM

## 2013-07-23 DIAGNOSIS — N400 Enlarged prostate without lower urinary tract symptoms: Secondary | ICD-10-CM | POA: Diagnosis not present

## 2013-08-02 DIAGNOSIS — F319 Bipolar disorder, unspecified: Secondary | ICD-10-CM | POA: Diagnosis not present

## 2013-08-30 DIAGNOSIS — F319 Bipolar disorder, unspecified: Secondary | ICD-10-CM | POA: Diagnosis not present

## 2013-09-15 DIAGNOSIS — Z7901 Long term (current) use of anticoagulants: Secondary | ICD-10-CM | POA: Diagnosis not present

## 2013-09-27 DIAGNOSIS — F319 Bipolar disorder, unspecified: Secondary | ICD-10-CM | POA: Diagnosis not present

## 2013-09-29 ENCOUNTER — Other Ambulatory Visit (HOSPITAL_COMMUNITY): Payer: Self-pay | Admitting: Family Medicine

## 2013-09-29 ENCOUNTER — Ambulatory Visit (HOSPITAL_COMMUNITY)
Admission: RE | Admit: 2013-09-29 | Discharge: 2013-09-29 | Disposition: A | Discharge status: Home / Self Care | Payer: Medicare Other | Source: Ambulatory Visit | Attending: Family Medicine | Admitting: Family Medicine

## 2013-09-29 DIAGNOSIS — M7730 Calcaneal spur, unspecified foot: Secondary | ICD-10-CM | POA: Insufficient documentation

## 2013-09-29 DIAGNOSIS — W1809XA Striking against other object with subsequent fall, initial encounter: Secondary | ICD-10-CM | POA: Insufficient documentation

## 2013-09-29 DIAGNOSIS — M25579 Pain in unspecified ankle and joints of unspecified foot: Secondary | ICD-10-CM

## 2013-09-29 DIAGNOSIS — M84469A Pathological fracture, unspecified tibia and fibula, initial encounter for fracture: Secondary | ICD-10-CM | POA: Diagnosis not present

## 2013-09-29 DIAGNOSIS — Z6831 Body mass index (BMI) 31.0-31.9, adult: Secondary | ICD-10-CM | POA: Diagnosis not present

## 2013-09-29 DIAGNOSIS — Z7901 Long term (current) use of anticoagulants: Secondary | ICD-10-CM | POA: Diagnosis not present

## 2013-09-29 DIAGNOSIS — S82899A Other fracture of unspecified lower leg, initial encounter for closed fracture: Secondary | ICD-10-CM | POA: Insufficient documentation

## 2013-09-30 ENCOUNTER — Ambulatory Visit (INDEPENDENT_AMBULATORY_CARE_PROVIDER_SITE_OTHER): Payer: Medicare Other | Admitting: Orthopedic Surgery

## 2013-09-30 ENCOUNTER — Encounter: Payer: Self-pay | Admitting: Orthopedic Surgery

## 2013-09-30 VITALS — BP 102/52 | Ht 73.0 in | Wt 221.0 lb

## 2013-09-30 DIAGNOSIS — S82409A Unspecified fracture of shaft of unspecified fibula, initial encounter for closed fracture: Secondary | ICD-10-CM

## 2013-09-30 DIAGNOSIS — Z7901 Long term (current) use of anticoagulants: Secondary | ICD-10-CM | POA: Diagnosis not present

## 2013-09-30 NOTE — Progress Notes (Signed)
Patient ID: Casey [name], male   DOB: 24-Aug-1945, 68 y.o.   MRN: [id number] Chief Complaint  Patient presents with  . Leg Pain    Nondisplaced oblique distal left fibular fracture d/t injury 09/28/13    HISTORY: 68 year old male hit a chair with his foot and left ankle fell down and fractured his fibula on the 17th had x-rays on the 18th. He has a nondisplaced transverse fracture of the fibula with an intact mortise  Complains of minimal discomfort. Complains of swelling. Denies neurovascular symptoms.  Review of systems as recorded  History recorded and reviewed  The past, family history and social history have been reviewed and are recorded in the corresponding sections of epic   Vital signs: BP 102/52  Ht 6\' 1"  (1.854 m)  Wt 221 lb (100.245 kg)  BMI 29.16 kg/m2   General the patient is well-developed and well-nourished grooming and hygiene are normal Oriented x3 Mood and affect normal Ambulation normal  Inspection of the left foot and ankle shows swelling and tenderness over the fibula Full range of motion All joints are stable Motor exam is normal Skin clean dry and intact  Cardiovascular exam is normal Sensory exam normal  X-ray shows a nondisplaced transverse fibular fracture which we have elected to treat in a Cam Walker for 6 weeks and then x-rayed.

## 2013-09-30 NOTE — Patient Instructions (Signed)
Wear boot for 6 weeks

## 2013-10-04 ENCOUNTER — Telehealth: Payer: Self-pay | Admitting: Orthopedic Surgery

## 2013-10-04 NOTE — Telephone Encounter (Signed)
Patient's wife called (relayed we will need to also speak with patient directly upon discussing) - she states he has questions about weight-bearing in the brace (boot) and about much mobility while wearing; also said he is still noticing some pain and swelling at the top of the foot.  Please call to advise at home ph# [phone].  (His next scheduled appointment is 11/11/13.)

## 2013-10-04 NOTE — Telephone Encounter (Signed)
Returned call to patient, and he states he is having a lot of pain and swelling. He stated he was not wearing his boot at night and tossed and turned a lot.He also stated he was taking tylenol for the pain. I advised that he wear his boot at night and keep foot elevated. I also advised he could use ice for the swelling. I told him to call me back tomorrow if no better and I would put a note in to Dr. Aline Brochure to advise.

## 2013-10-05 DIAGNOSIS — Z7901 Long term (current) use of anticoagulants: Secondary | ICD-10-CM | POA: Diagnosis not present

## 2013-10-05 NOTE — Telephone Encounter (Signed)
Returned call to patient, and he states that it did help to sleep in the boot,  because he was not moving it as much. Also, he states the swelling was not as bad. I advised if he had any more problems to call our office.

## 2013-10-27 DIAGNOSIS — F319 Bipolar disorder, unspecified: Secondary | ICD-10-CM | POA: Diagnosis not present

## 2013-10-28 DIAGNOSIS — Z7901 Long term (current) use of anticoagulants: Secondary | ICD-10-CM | POA: Diagnosis not present

## 2013-11-09 DIAGNOSIS — Z7901 Long term (current) use of anticoagulants: Secondary | ICD-10-CM | POA: Diagnosis not present

## 2013-11-11 ENCOUNTER — Ambulatory Visit (INDEPENDENT_AMBULATORY_CARE_PROVIDER_SITE_OTHER): Payer: Medicare Other

## 2013-11-11 ENCOUNTER — Encounter: Payer: Self-pay | Admitting: Orthopedic Surgery

## 2013-11-11 ENCOUNTER — Ambulatory Visit (INDEPENDENT_AMBULATORY_CARE_PROVIDER_SITE_OTHER): Payer: Self-pay | Admitting: Orthopedic Surgery

## 2013-11-11 VITALS — BP 133/80 | Ht 73.0 in | Wt 221.0 lb

## 2013-11-11 DIAGNOSIS — S82899A Other fracture of unspecified lower leg, initial encounter for closed fracture: Secondary | ICD-10-CM

## 2013-11-11 DIAGNOSIS — S82892A Other fracture of left lower leg, initial encounter for closed fracture: Secondary | ICD-10-CM

## 2013-11-11 NOTE — Patient Instructions (Signed)
Wear boot 2 weeks come back 3 weeks

## 2013-11-11 NOTE — Progress Notes (Signed)
Patient ID: Casey [name], male   DOB: 02-Apr-1946, 68 y.o.   MRN: [id number]  Chief Complaint  Patient presents with  . Follow-up    6 week recheck on left ankle fracture with xray. DOI 09-28-13.    Subjective complaint soreness.  Exam mild tenderness. Neurovascular intact skin a little dry  X-rays expected appearance Fibula fracture  Our recommendations are continue to [place] for 2 weeks then return in 3 weeks for reexamination only.

## 2013-11-24 DIAGNOSIS — F319 Bipolar disorder, unspecified: Secondary | ICD-10-CM | POA: Diagnosis not present

## 2013-12-02 ENCOUNTER — Ambulatory Visit (INDEPENDENT_AMBULATORY_CARE_PROVIDER_SITE_OTHER): Payer: Self-pay | Admitting: Orthopedic Surgery

## 2013-12-02 ENCOUNTER — Encounter: Payer: Self-pay | Admitting: Orthopedic Surgery

## 2013-12-02 VITALS — BP 139/86

## 2013-12-02 DIAGNOSIS — S82892A Other fracture of left lower leg, initial encounter for closed fracture: Secondary | ICD-10-CM

## 2013-12-02 DIAGNOSIS — S82899A Other fracture of unspecified lower leg, initial encounter for closed fracture: Secondary | ICD-10-CM

## 2013-12-02 NOTE — Progress Notes (Signed)
Patient ID: Casey [name], male   DOB: [birth year], 68 y.o.   MRN: [id number]  Recheck left fibular fracture  Initial treatment Cam Walker  Patient has no complaints  Exam shows no tenderness full range of motion and normal stability to the ankle  Impression fibular fracture left ankle  Patient is discharged to normal activities.

## 2013-12-13 DIAGNOSIS — Z7901 Long term (current) use of anticoagulants: Secondary | ICD-10-CM | POA: Diagnosis not present

## 2013-12-13 DIAGNOSIS — Z125 Encounter for screening for malignant neoplasm of prostate: Secondary | ICD-10-CM | POA: Diagnosis not present

## 2013-12-13 DIAGNOSIS — Z23 Encounter for immunization: Secondary | ICD-10-CM | POA: Diagnosis not present

## 2013-12-13 DIAGNOSIS — I4891 Unspecified atrial fibrillation: Secondary | ICD-10-CM | POA: Diagnosis not present

## 2013-12-13 DIAGNOSIS — E039 Hypothyroidism, unspecified: Secondary | ICD-10-CM | POA: Diagnosis not present

## 2013-12-13 DIAGNOSIS — Z6831 Body mass index (BMI) 31.0-31.9, adult: Secondary | ICD-10-CM | POA: Diagnosis not present

## 2013-12-13 DIAGNOSIS — M539 Dorsopathy, unspecified: Secondary | ICD-10-CM | POA: Diagnosis not present

## 2013-12-15 DIAGNOSIS — R82998 Other abnormal findings in urine: Secondary | ICD-10-CM | POA: Diagnosis not present

## 2013-12-15 DIAGNOSIS — R972 Elevated prostate specific antigen [PSA]: Secondary | ICD-10-CM | POA: Diagnosis not present

## 2013-12-23 DIAGNOSIS — F319 Bipolar disorder, unspecified: Secondary | ICD-10-CM | POA: Diagnosis not present

## 2013-12-24 ENCOUNTER — Ambulatory Visit (INDEPENDENT_AMBULATORY_CARE_PROVIDER_SITE_OTHER): Payer: Medicare Other | Admitting: Cardiology

## 2013-12-24 VITALS — BP 138/88 | HR 92 | Ht 73.0 in | Wt 225.3 lb

## 2013-12-24 DIAGNOSIS — R0989 Other specified symptoms and signs involving the circulatory and respiratory systems: Secondary | ICD-10-CM

## 2013-12-24 DIAGNOSIS — R0609 Other forms of dyspnea: Secondary | ICD-10-CM

## 2013-12-24 DIAGNOSIS — R0600 Dyspnea, unspecified: Secondary | ICD-10-CM

## 2013-12-24 NOTE — Patient Instructions (Signed)
Get ultrasound of your heart done. We will call you to arrange a follow-up appointment if abnormal. If the test is normal, then you should follow-up with Dr. Sallyanne Kuster in October for your yearly follow-up.

## 2013-12-24 NOTE — Progress Notes (Signed)
Patient ID: Casey [name], male   DOB: Dec 02, 1945, 68 y.o.   MRN: [id number]    12/24/2013 Casey [name]   [birth year]  [id number]  Primary Physicia Purvis Kilts, MD Primary Cardiologist: Dr Sallyanne Kuster  HPI:  The patient is a 68 year old male followed by Dr. Sallyanne Kuster. He has a history of persistent atrial fibrillation that is essentially asymptomatic. We have tried several cardioversions and different antiarrhythmic medications without success. He is on chronic warfarin therapy for stroke prophylaxis. He also has a history of depression, anxiety and bipolar disorder.  He presents to clinic today for evaluation of recent dyspnea for the past 2 months. He reported this to his primary care provider and he was instructed to followup with our office for further evaluation to rule out cardiac etiologies. He is somewhat of a poor historian, but  from what I gather from this encounter is that he has shortness of breath that occurs at times with rest but can also occur with mild physical activity. He denies orthopnea, PND and LEE. He also denies chest pain, palpitations, dizziness, syncope/near-syncope. His last echocardiogram was in May of 2014 which revealed "normal systolic function". The exact ejection fraction estimate was not mentioned. There is also no mention of any diastolic dysfunction. The patient states that he would feel reassured knowing that this is not related to any cardiac conditions, however he feels strongly that his shortness of breath may also be related to anxiety/panic attacks.   Current Outpatient Prescriptions  Medication Sig Dispense Refill  . cholecalciferol (VITAMIN D) 1000 UNITS tablet Take 2,000 Units by mouth daily.      . citalopram (CELEXA) 20 MG tablet Take 1 tablet by mouth daily.      . cyclobenzaprine (FLEXERIL) 10 MG tablet Take 10 mg by mouth 3 (three) times daily as needed for muscle spasms.      Marland Kitchen doxycycline (VIBRA-TABS) 100 MG tablet Take 1 tablet by mouth 2  (two) times daily.      Marland Kitchen lamoTRIgine (LAMICTAL) 200 MG tablet Take 1 tablet by mouth daily.      Marland Kitchen levothyroxine (SYNTHROID, LEVOTHROID) 75 MCG tablet Take 25 mcg by mouth daily before breakfast.       . oxcarbazepine (TRILEPTAL) 600 MG tablet Take 600 mg by mouth 2 (two) times daily.       . pravastatin (PRAVACHOL) 40 MG tablet Take 40 mg by mouth daily.      Marland Kitchen warfarin (COUMADIN) 5 MG tablet Take 5 mg by mouth daily. AS PER INR       No current facility-administered medications for this visit.    Allergies  Allergen Reactions  . Amlodipine     History   Social History  . Marital Status: Married    Spouse Name: N/A    Number of Children: N/A  . Years of Education: N/A   Occupational History  . Not on file.   Social History Main Topics  . Smoking status: Never Smoker   . Smokeless tobacco: Not on file  . Alcohol Use: No  . Drug Use: No  . Sexual Activity: Not on file   Other Topics Concern  . Not on file   Social History Narrative  . No narrative on file     Review of Systems: General: negative for chills, fever, night sweats or weight changes.  Cardiovascular: negative for chest pain, dyspnea on exertion, edema, orthopnea, palpitations, paroxysmal nocturnal dyspnea or shortness of breath Dermatological: negative for rash Respiratory: negative for  cough or wheezing Urologic: negative for hematuria Abdominal: negative for nausea, vomiting, diarrhea, bright red blood per rectum, melena, or hematemesis Neurologic: negative for visual changes, syncope, or dizziness All other systems reviewed and are otherwise negative except as noted above.    Blood pressure 138/88, pulse 92, height 6\' 1"  (1.854 m), weight 225 lb 4.8 oz (102.195 kg).  General appearance: alert, cooperative and no distress Neck: no carotid bruit and no JVD Lungs: clear to auscultation bilaterally Heart: regular rate and rhythm, S1, S2 normal, no murmur, click, rub or gallop Extremities: no  LEE Pulses: 2+ and symmetric Skin: warm and dry Neurologic: Grossly normal  EKG: atrial fibrillation with a controlled ventricular response. Heart rate 92 beats per minute.  ASSESSMENT AND PLAN:   1. Dyspnea: will order a 2D echo to assess systolic and diastolic function and to r/o other potential cardiac etiologies.   2. Atrial fibrillation: rate is controlled in the 90s.   3. Chronic oral anticoagulation: on chronic warfarin therapy. He denies abnormal bleeding and falls.   PLAN  2D echo to assess for potential cardiac etiologies that may explain his recent symptoms. Will arrange f/u if abnormal. If results are normal, he has ben instructed to return in October for his yearly f/u with Dr. Sallyanne Kuster.   Careen Mauch, BRITTAINYPA-C 12/24/2013 4:10 PM

## 2014-01-03 ENCOUNTER — Encounter: Payer: Self-pay | Admitting: Cardiology

## 2014-01-03 DIAGNOSIS — Z7901 Long term (current) use of anticoagulants: Secondary | ICD-10-CM | POA: Diagnosis not present

## 2014-01-05 ENCOUNTER — Ambulatory Visit (HOSPITAL_COMMUNITY)
Admission: RE | Admit: 2014-01-05 | Discharge: 2014-01-05 | Disposition: A | Discharge status: Home / Self Care | Payer: Medicare Other | Source: Ambulatory Visit | Attending: Cardiology | Admitting: Cardiology

## 2014-01-05 DIAGNOSIS — I059 Rheumatic mitral valve disease, unspecified: Secondary | ICD-10-CM

## 2014-01-05 DIAGNOSIS — R0600 Dyspnea, unspecified: Secondary | ICD-10-CM

## 2014-01-05 DIAGNOSIS — R0609 Other forms of dyspnea: Secondary | ICD-10-CM | POA: Insufficient documentation

## 2014-01-05 DIAGNOSIS — R0989 Other specified symptoms and signs involving the circulatory and respiratory systems: Principal | ICD-10-CM | POA: Insufficient documentation

## 2014-01-05 NOTE — Progress Notes (Signed)
2D Echo Performed 01/05/2014    Reeta Kuk, RCS  

## 2014-02-03 DIAGNOSIS — F319 Bipolar disorder, unspecified: Secondary | ICD-10-CM | POA: Diagnosis not present

## 2014-02-17 DIAGNOSIS — F319 Bipolar disorder, unspecified: Secondary | ICD-10-CM | POA: Diagnosis not present

## 2014-02-25 ENCOUNTER — Ambulatory Visit (INDEPENDENT_AMBULATORY_CARE_PROVIDER_SITE_OTHER): Payer: Medicare Other | Admitting: Urology

## 2014-02-25 DIAGNOSIS — N400 Enlarged prostate without lower urinary tract symptoms: Secondary | ICD-10-CM

## 2014-02-25 DIAGNOSIS — N411 Chronic prostatitis: Secondary | ICD-10-CM

## 2014-02-25 DIAGNOSIS — R972 Elevated prostate specific antigen [PSA]: Secondary | ICD-10-CM

## 2014-02-25 DIAGNOSIS — Z7901 Long term (current) use of anticoagulants: Secondary | ICD-10-CM | POA: Diagnosis not present

## 2014-03-14 DIAGNOSIS — F319 Bipolar disorder, unspecified: Secondary | ICD-10-CM | POA: Diagnosis not present

## 2014-03-22 DIAGNOSIS — Z7901 Long term (current) use of anticoagulants: Secondary | ICD-10-CM | POA: Diagnosis not present

## 2014-03-22 DIAGNOSIS — Z79899 Other long term (current) drug therapy: Secondary | ICD-10-CM | POA: Diagnosis not present

## 2014-03-30 DIAGNOSIS — Z7901 Long term (current) use of anticoagulants: Secondary | ICD-10-CM | POA: Diagnosis not present

## 2014-04-11 DIAGNOSIS — Z7901 Long term (current) use of anticoagulants: Secondary | ICD-10-CM | POA: Diagnosis not present

## 2014-04-12 DIAGNOSIS — F319 Bipolar disorder, unspecified: Secondary | ICD-10-CM | POA: Diagnosis not present

## 2014-04-18 DIAGNOSIS — R7301 Impaired fasting glucose: Secondary | ICD-10-CM | POA: Diagnosis not present

## 2014-04-18 DIAGNOSIS — E782 Mixed hyperlipidemia: Secondary | ICD-10-CM | POA: Diagnosis not present

## 2014-04-18 DIAGNOSIS — F3162 Bipolar disorder, current episode mixed, moderate: Secondary | ICD-10-CM | POA: Diagnosis not present

## 2014-04-18 DIAGNOSIS — Z6832 Body mass index (BMI) 32.0-32.9, adult: Secondary | ICD-10-CM | POA: Diagnosis not present

## 2014-04-18 DIAGNOSIS — I10 Essential (primary) hypertension: Secondary | ICD-10-CM | POA: Diagnosis not present

## 2014-05-09 ENCOUNTER — Ambulatory Visit (INDEPENDENT_AMBULATORY_CARE_PROVIDER_SITE_OTHER): Payer: Medicare Other | Admitting: Cardiovascular Disease

## 2014-05-09 ENCOUNTER — Encounter: Payer: Self-pay | Admitting: Cardiovascular Disease

## 2014-05-09 VITALS — BP 148/88 | HR 74 | Resp 16 | Ht 73.0 in | Wt 230.3 lb

## 2014-05-09 DIAGNOSIS — I10 Essential (primary) hypertension: Secondary | ICD-10-CM

## 2014-05-09 DIAGNOSIS — I482 Chronic atrial fibrillation, unspecified: Secondary | ICD-10-CM

## 2014-05-09 NOTE — Patient Instructions (Signed)
STAY WELL HYDRATED.  Dr. Sallyanne Kuster recommends that you schedule a follow-up appointment in: 12 months.

## 2014-05-11 ENCOUNTER — Encounter: Payer: Self-pay | Admitting: Cardiovascular Disease

## 2014-05-11 NOTE — Progress Notes (Signed)
Patient ID: Casey [name], male   DOB: [birth year], 68 y.o.   MRN: [id number]     Reason for office visit Permanent atrial fibrillation  Casey [name] is generally feeling quite well. His major complaint is occasional lightheadedness when he stands up from a sitting position. He does not take any antihypertensive medications but takes several neurological agents that might be responsible for this. He is unaware of his persistent irregular rhythm and denies shortness of breath, chest pain, edema, palpitations or syncope. Recently has not had any mood swings.  He has normal left ventricular systolic function and moderate biatrial dilatation. He does not have any serious valvular abnormalities. There was no evidence of coronary disease by nuclear testing in 2011. He has failed attempts to restore to normal sinus rhythm with a variety of antiarrhythmics and several cardioversions. He has a history of bipolar disorder and his mood swings often associated broad variation in weight. He has well treated hypertension and hyperlipidemia and takes a low dose of levothyroxine for hypothyroidism. Anticoagulation with warfarin is followed in Dr. Cornelia [name] office.  Allergies  Allergen Reactions  . Amlodipine     Current Outpatient Prescriptions  Medication Sig Dispense Refill  . cholecalciferol (VITAMIN D) 1000 UNITS tablet Take 2,000 Units by mouth daily.      . citalopram (CELEXA) 20 MG tablet Take 1 tablet by mouth daily.      . cyclobenzaprine (FLEXERIL) 10 MG tablet Take 10 mg by mouth 3 (three) times daily as needed for muscle spasms.      Marland Kitchen lamoTRIgine (LAMICTAL) 200 MG tablet Take 1 tablet by mouth 2 (two) times daily.       Marland Kitchen levothyroxine (SYNTHROID, LEVOTHROID) 75 MCG tablet Take 25 mcg by mouth daily before breakfast.       . oxcarbazepine (TRILEPTAL) 600 MG tablet Take 600 mg by mouth 2 (two) times daily.       . pravastatin (PRAVACHOL) 40 MG tablet Take 40 mg by mouth daily.      Marland Kitchen warfarin (COUMADIN) 5  MG tablet Take 5 mg by mouth daily. AS PER INR       No current facility-administered medications for this visit.    Past Medical History  Diagnosis Date  . Atrial fibrillation 01/12/2010    2D Echo EF=>55%  . Depression   . Hyperlipidemia   . Hypertension   . Bipolar disorder   . Morbid obesity   . Hypothyroidism   . Prostatitis   . OSA (obstructive sleep apnea) 10/24/2009    AHI was 27.62hr RDI was 34.3 hr REM 0.00hr     Past Surgical History  Procedure Laterality Date  . Colonoscopy    . Tonsillectomy  1955  . Appendectomy  1959  . Cataract extraction  1995    Family History  Problem Relation Age of Onset  . Diabetes Mother   . Cancer Paternal Grandmother   . Heart failure Mother   . Hypertension Mother   . Hypertension Father     History   Social History  . Marital Status: Married    Spouse Name: N/A    Number of Children: N/A  . Years of Education: N/A   Occupational History  . Not on file.   Social History Main Topics  . Smoking status: Never Smoker   . Smokeless tobacco: Not on file  . Alcohol Use: No  . Drug Use: No  . Sexual Activity: Not on file   Other Topics Concern  . Not on  file   Social History Narrative  . No narrative on file    Review of systems: Orthostatic dizziness.  The patient specifically denies any chest pain at rest or with exertion, dyspnea at rest or with exertion, orthopnea, paroxysmal nocturnal dyspnea, syncope, palpitations, focal neurological deficits, intermittent claudication, lower extremity edema, unexplained weight gain, cough, hemoptysis or wheezing.  The patient also denies abdominal pain, nausea, vomiting, dysphagia, diarrhea, constipation, polyuria, polydipsia, dysuria, hematuria, frequency, urgency, abnormal bleeding or bruising, fever, chills, unexpected weight changes, mood swings, change in skin or hair texture, change in voice quality, auditory or visual problems, allergic reactions or rashes, new  musculoskeletal complaints other than usual "aches and pains".  PHYSICAL EXAM BP 148/88  Pulse 74  Resp 16  Ht 6\' 1"  (1.854 m)  Wt 230 lb 4.8 oz (104.463 kg)  BMI 30.39 kg/m2  General: Alert, oriented x3, no distress Head: no evidence of trauma, PERRL, EOMI, no exophtalmos or lid lag, no myxedema, no xanthelasma; normal ears, nose and oropharynx Neck: normal jugular venous pulsations and no hepatojugular reflux; brisk carotid pulses without delay and no carotid bruits Chest: clear to auscultation, no signs of consolidation by percussion or palpation, normal fremitus, symmetrical and full respiratory excursions Cardiovascular: normal position and quality of the apical impulse, regular rhythm, normal first and second heart sounds, no murmurs, rubs or gallops Abdomen: no tenderness or distention, no masses by palpation, no abnormal pulsatility or arterial bruits, normal bowel sounds, no hepatosplenomegaly Extremities: no clubbing, cyanosis or edema; 2+ radial, ulnar and brachial pulses bilaterally; 2+ right femoral, posterior tibial and dorsalis pedis pulses; 2+ left femoral, posterior tibial and dorsalis pedis pulses; no subclavian or femoral bruits Neurological: grossly nonfocal   EKG: Atrial fibrillation, otherwise normal   ASSESSMENT AND PLAN  Permanent atrial fibrillation  Essentially asymptomatic, on appropriate anticoagulation and well rate controlled without need for medications. This suggests he has intrinsic AV conduction disease and might require pacemaker therapy in the future if he develops symptomatic bradycardia. Hypertension Satisfactory control. Avoid tighter blood pressure control since he has orthostatic hypotension, possibly related to citalopram and oxcarbazepine Hyperlipidemia He reports it was checked by his primary care physician, will retrieve results.  Casey Humbles, MD, [place] [phone] office [phone] pager

## 2014-05-12 DIAGNOSIS — F319 Bipolar disorder, unspecified: Secondary | ICD-10-CM | POA: Diagnosis not present

## 2014-05-18 DIAGNOSIS — Z7901 Long term (current) use of anticoagulants: Secondary | ICD-10-CM | POA: Diagnosis not present

## 2014-05-27 DIAGNOSIS — Z23 Encounter for immunization: Secondary | ICD-10-CM | POA: Diagnosis not present

## 2014-05-27 DIAGNOSIS — N182 Chronic kidney disease, stage 2 (mild): Secondary | ICD-10-CM | POA: Diagnosis not present

## 2014-05-27 DIAGNOSIS — E785 Hyperlipidemia, unspecified: Secondary | ICD-10-CM | POA: Diagnosis not present

## 2014-05-27 DIAGNOSIS — Z0000 Encounter for general adult medical examination without abnormal findings: Secondary | ICD-10-CM | POA: Diagnosis not present

## 2014-05-27 DIAGNOSIS — I482 Chronic atrial fibrillation: Secondary | ICD-10-CM | POA: Diagnosis not present

## 2014-05-31 DIAGNOSIS — Z7901 Long term (current) use of anticoagulants: Secondary | ICD-10-CM | POA: Diagnosis not present

## 2014-07-11 DIAGNOSIS — Z7901 Long term (current) use of anticoagulants: Secondary | ICD-10-CM | POA: Diagnosis not present

## 2014-07-21 DIAGNOSIS — F319 Bipolar disorder, unspecified: Secondary | ICD-10-CM | POA: Diagnosis not present

## 2014-08-30 DIAGNOSIS — Z7901 Long term (current) use of anticoagulants: Secondary | ICD-10-CM | POA: Diagnosis not present

## 2014-09-15 DIAGNOSIS — F319 Bipolar disorder, unspecified: Secondary | ICD-10-CM | POA: Diagnosis not present

## 2014-11-02 DIAGNOSIS — Z7901 Long term (current) use of anticoagulants: Secondary | ICD-10-CM | POA: Diagnosis not present

## 2014-12-15 DIAGNOSIS — F319 Bipolar disorder, unspecified: Secondary | ICD-10-CM | POA: Diagnosis not present

## 2015-01-24 DIAGNOSIS — Z7901 Long term (current) use of anticoagulants: Secondary | ICD-10-CM | POA: Diagnosis not present

## 2015-02-22 DIAGNOSIS — Z7901 Long term (current) use of anticoagulants: Secondary | ICD-10-CM | POA: Diagnosis not present

## 2015-02-23 DIAGNOSIS — E782 Mixed hyperlipidemia: Secondary | ICD-10-CM | POA: Diagnosis not present

## 2015-02-23 DIAGNOSIS — R7301 Impaired fasting glucose: Secondary | ICD-10-CM | POA: Diagnosis not present

## 2015-02-23 DIAGNOSIS — E6609 Other obesity due to excess calories: Secondary | ICD-10-CM | POA: Diagnosis not present

## 2015-02-23 DIAGNOSIS — S83411A Sprain of medial collateral ligament of right knee, initial encounter: Secondary | ICD-10-CM | POA: Diagnosis not present

## 2015-02-23 DIAGNOSIS — N183 Chronic kidney disease, stage 3 (moderate): Secondary | ICD-10-CM | POA: Diagnosis not present

## 2015-02-23 DIAGNOSIS — Z6833 Body mass index (BMI) 33.0-33.9, adult: Secondary | ICD-10-CM | POA: Diagnosis not present

## 2015-02-23 DIAGNOSIS — Z125 Encounter for screening for malignant neoplasm of prostate: Secondary | ICD-10-CM | POA: Diagnosis not present

## 2015-02-23 DIAGNOSIS — I10 Essential (primary) hypertension: Secondary | ICD-10-CM | POA: Diagnosis not present

## 2015-02-23 DIAGNOSIS — Z1389 Encounter for screening for other disorder: Secondary | ICD-10-CM | POA: Diagnosis not present

## 2015-04-05 DIAGNOSIS — Z7901 Long term (current) use of anticoagulants: Secondary | ICD-10-CM | POA: Diagnosis not present

## 2015-04-07 DIAGNOSIS — F319 Bipolar disorder, unspecified: Secondary | ICD-10-CM | POA: Diagnosis not present

## 2015-04-14 DIAGNOSIS — Z6833 Body mass index (BMI) 33.0-33.9, adult: Secondary | ICD-10-CM | POA: Diagnosis not present

## 2015-04-14 DIAGNOSIS — Z0000 Encounter for general adult medical examination without abnormal findings: Secondary | ICD-10-CM | POA: Diagnosis not present

## 2015-04-14 DIAGNOSIS — Z23 Encounter for immunization: Secondary | ICD-10-CM | POA: Diagnosis not present

## 2015-05-09 DIAGNOSIS — Z85828 Personal history of other malignant neoplasm of skin: Secondary | ICD-10-CM | POA: Diagnosis not present

## 2015-05-09 DIAGNOSIS — Z08 Encounter for follow-up examination after completed treatment for malignant neoplasm: Secondary | ICD-10-CM | POA: Diagnosis not present

## 2015-05-09 DIAGNOSIS — Z1283 Encounter for screening for malignant neoplasm of skin: Secondary | ICD-10-CM | POA: Diagnosis not present

## 2015-05-09 DIAGNOSIS — L570 Actinic keratosis: Secondary | ICD-10-CM | POA: Diagnosis not present

## 2015-05-09 DIAGNOSIS — X32XXXD Exposure to sunlight, subsequent encounter: Secondary | ICD-10-CM | POA: Diagnosis not present

## 2015-05-10 ENCOUNTER — Encounter: Payer: Self-pay | Admitting: Cardiovascular Disease

## 2015-05-10 ENCOUNTER — Ambulatory Visit (INDEPENDENT_AMBULATORY_CARE_PROVIDER_SITE_OTHER): Payer: Medicare Other | Admitting: Cardiovascular Disease

## 2015-05-10 VITALS — BP 146/70 | HR 85 | Ht 73.0 in | Wt 233.2 lb

## 2015-05-10 DIAGNOSIS — I482 Chronic atrial fibrillation, unspecified: Secondary | ICD-10-CM

## 2015-05-10 DIAGNOSIS — E785 Hyperlipidemia, unspecified: Secondary | ICD-10-CM | POA: Diagnosis not present

## 2015-05-10 DIAGNOSIS — I10 Essential (primary) hypertension: Secondary | ICD-10-CM

## 2015-05-10 NOTE — Patient Instructions (Signed)
Your physician encouraged you to lose weight for better health.  Dr. Sallyanne Kuster recommends that you schedule a follow-up appointment in: [place]

## 2015-05-10 NOTE — Progress Notes (Signed)
Patient ID: Casey [name], male   DOB: [birth year], 69 y.o.   MRN: [id number]      Cardiology Office Note   Date:  05/10/2015   ID:  JAIMIN KRUPKA, DOB [birth year], MRN [id number]  PCP:  Purvis Kilts, MD  Cardiologist:   Sanda Klein, MD   Chief Complaint  Patient presents with  . Annual Exam    patient reports no problems since last visit. patient would like to discuss his cholestereol.      History of Present Illness: Casey [name] is a 69 y.o. male who presents for  Permanent atrial fibrillation, hyperlipidemia and hypertension. Suyash has done well since his last appointment without focal neurological events or bleeding complications, on treatment with warfarin. He has gained weight and is now officially in the mildly obese range. Blood pressure control is borderline. He requested to stop taking statin and a follow-up lipid profile shows total cholesterol 240, LDL 157, triglycerides 133, HDL 56. Thyroid, hematology, routine chemistries are all normal including a creatinine of 1.1   his depression, which has been severe in the past has completely resolved and he is very active. He is fixing up the outside of his house. He denies problems with dizziness, shortness of breath, angina, syncope, palpitations, or lower extremity edema or focal neurological deficits.   his most recent echocardiogram in June 2015 showed normal left ventricular size and function with an ejection fraction of 55-60% and biatrial dilatation. He does not have known coronary or peripheral arterial disease and a nuclear perfusion study in 2011 was normal.  He has not had diabetes or congestive heart failure.  His blood pressure problem seemed to resolve when he lost weight.  Past Medical History  Diagnosis Date  . Atrial fibrillation ([place]) 01/12/2010    2D Echo EF=>55%  . Depression   . Hyperlipidemia   . Hypertension   . Bipolar disorder ([place])   . Morbid obesity ([place])   . Hypothyroidism   . Prostatitis    . OSA (obstructive sleep apnea) 10/24/2009    AHI was 27.62hr RDI was 34.3 hr REM 0.00hr     Past Surgical History  Procedure Laterality Date  . Colonoscopy    . Tonsillectomy  1955  . Appendectomy  1959  . Cataract extraction  1995     Current Outpatient Prescriptions  Medication Sig Dispense Refill  . cholecalciferol (VITAMIN D) 1000 UNITS tablet Take 2,000 Units by mouth daily.    . citalopram (CELEXA) 20 MG tablet Take 1 tablet by mouth daily.    Marland Kitchen lamoTRIgine (LAMICTAL) 200 MG tablet Take 1 tablet by mouth 2 (two) times daily.     Marland Kitchen levothyroxine (SYNTHROID, LEVOTHROID) 25 MCG tablet Take 1 tablet by mouth daily.    Marland Kitchen oxcarbazepine (TRILEPTAL) 600 MG tablet Take 600 mg by mouth 2 (two) times daily.     Marland Kitchen warfarin (COUMADIN) 5 MG tablet Take 5 mg by mouth daily. AS PER INR     No current facility-administered medications for this visit.    Allergies:   Amlodipine    Social History:  The patient  reports that he has never smoked. He does not have any smokeless tobacco history on file. He reports that he does not drink alcohol or use illicit drugs.   Family History:  The patient's family history includes Cancer in his paternal grandmother; Diabetes in his mother; Heart failure in his mother; Hypertension in his father and mother.    ROS:  Please see  the history of present illness.    Otherwise, review of systems positive for none.   All other systems are reviewed and negative.    PHYSICAL EXAM: VS:  BP 146/70 mmHg  Pulse 85  Ht 6\' 1"  (1.854 m)  Wt 233 lb 3.2 oz (105.779 kg)  BMI 30.77 kg/m2 , BMI Body mass index is 30.77 kg/(m^2).  General: Alert, oriented x3, no distress Head: no evidence of trauma, PERRL, EOMI, no exophtalmos or lid lag, no myxedema, no xanthelasma; normal ears, nose and oropharynx Neck: normal jugular venous pulsations and no hepatojugular reflux; brisk carotid pulses without delay and no carotid bruits Chest: clear to auscultation, no signs of  consolidation by percussion or palpation, normal fremitus, symmetrical and full respiratory excursions Cardiovascular: normal position and quality of the apical impulse, irregular rhythm, normal first and second heart sounds, no murmurs, rubs or gallops Abdomen: no tenderness or distention, no masses by palpation, no abnormal pulsatility or arterial bruits, normal bowel sounds, no hepatosplenomegaly Extremities: no clubbing, cyanosis or edema; 2+ radial, ulnar and brachial pulses bilaterally; 2+ right femoral, posterior tibial and dorsalis pedis pulses; 2+ left femoral, posterior tibial and dorsalis pedis pulses; no subclavian or femoral bruits Neurological: grossly nonfocal Psych: euthymic mood, full affect   EKG:  EKG is ordered today. The ekg ordered today demonstrates  Atrial fibrillation at 85 bpm, QTC 454 ms   Recent Labs: No results found for requested labs within last 365 days.    Lipid Panel No results found for: CHOL, TRIG, HDL, CHOLHDL, VLDL, LDLCALC, LDLDIRECT    Wt Readings from Last 3 Encounters:  05/10/15 233 lb 3.2 oz (105.779 kg)  05/09/14 230 lb 4.8 oz (104.463 kg)  12/24/13 225 lb 4.8 oz (102.195 kg)     ASSESSMENT AND PLAN:  1. Permanent atrial fibrillation -  Doing well with warfarin anticoagulation. No bleeding or embolic complications.CHADSVasc 2 (age, HTN).  Rate is well controlled despite no specific medications. This suggests some underlying conduction system disease.  2.  Essential hypertension, mild, weight related. He has gained 8 pounds since last year and this has made a significant difference in his blood pressure. We discussed weight loss plans.  Avoid excessive dietary sodium  3.  History of major depression , currently in remission  4.  Treated hypothyroidism  5.  Hyperlipidemia, primarily elevated total and LDL cholesterol. In the absence of known vascular disease as not imperative that he take a statin, but before he does develop vascular  complications I strongly encouraged him to lose weight and exercise more frequently. Set a target weight of 210 pounds by this time next year.     Current medicines are reviewed at length with the patient today.  The patient does not have concerns regarding medicines.  The following changes have been made:  no change  Labs/ tests ordered today include:  Orders Placed This Encounter  Procedures  . EKG 12-Lead     Patient Instructions  Your physician encouraged you to lose weight for better health.  Dr. Sallyanne Kuster recommends that you schedule a follow-up appointment in: ONE YEAR       SignedSanda Klein, MD  05/10/2015 2:29 PM    Sanda Klein, MD, [facility] [phone] office [phone] pager

## 2015-05-16 ENCOUNTER — Encounter: Payer: Self-pay | Admitting: Cardiovascular Disease

## 2015-05-29 DIAGNOSIS — Z7901 Long term (current) use of anticoagulants: Secondary | ICD-10-CM | POA: Diagnosis not present

## 2015-06-12 DIAGNOSIS — Z7901 Long term (current) use of anticoagulants: Secondary | ICD-10-CM | POA: Diagnosis not present

## 2015-06-12 DIAGNOSIS — E6609 Other obesity due to excess calories: Secondary | ICD-10-CM | POA: Diagnosis not present

## 2015-06-12 DIAGNOSIS — J209 Acute bronchitis, unspecified: Secondary | ICD-10-CM | POA: Diagnosis not present

## 2015-06-12 DIAGNOSIS — Z6832 Body mass index (BMI) 32.0-32.9, adult: Secondary | ICD-10-CM | POA: Diagnosis not present

## 2015-06-12 DIAGNOSIS — Z1389 Encounter for screening for other disorder: Secondary | ICD-10-CM | POA: Diagnosis not present

## 2015-06-12 DIAGNOSIS — J069 Acute upper respiratory infection, unspecified: Secondary | ICD-10-CM | POA: Diagnosis not present

## 2015-07-27 DIAGNOSIS — F319 Bipolar disorder, unspecified: Secondary | ICD-10-CM | POA: Diagnosis not present

## 2015-07-31 DIAGNOSIS — Z7901 Long term (current) use of anticoagulants: Secondary | ICD-10-CM | POA: Diagnosis not present

## 2015-08-15 DIAGNOSIS — Z7901 Long term (current) use of anticoagulants: Secondary | ICD-10-CM | POA: Diagnosis not present

## 2015-08-16 DIAGNOSIS — Z1389 Encounter for screening for other disorder: Secondary | ICD-10-CM | POA: Diagnosis not present

## 2015-08-16 DIAGNOSIS — I10 Essential (primary) hypertension: Secondary | ICD-10-CM | POA: Diagnosis not present

## 2015-08-16 DIAGNOSIS — Z6833 Body mass index (BMI) 33.0-33.9, adult: Secondary | ICD-10-CM | POA: Diagnosis not present

## 2015-08-22 DIAGNOSIS — Z7901 Long term (current) use of anticoagulants: Secondary | ICD-10-CM | POA: Diagnosis not present

## 2015-08-22 DIAGNOSIS — D0462 Carcinoma in situ of skin of left upper limb, including shoulder: Secondary | ICD-10-CM | POA: Diagnosis not present

## 2015-08-22 DIAGNOSIS — Z85828 Personal history of other malignant neoplasm of skin: Secondary | ICD-10-CM | POA: Diagnosis not present

## 2015-08-22 DIAGNOSIS — Z08 Encounter for follow-up examination after completed treatment for malignant neoplasm: Secondary | ICD-10-CM | POA: Diagnosis not present

## 2015-08-22 DIAGNOSIS — X32XXXD Exposure to sunlight, subsequent encounter: Secondary | ICD-10-CM | POA: Diagnosis not present

## 2015-08-22 DIAGNOSIS — L570 Actinic keratosis: Secondary | ICD-10-CM | POA: Diagnosis not present

## 2015-08-31 ENCOUNTER — Ambulatory Visit (HOSPITAL_COMMUNITY)
Admission: RE | Admit: 2015-08-31 | Discharge: 2015-08-31 | Disposition: A | Discharge status: Home / Self Care | Payer: Medicare Other | Source: Ambulatory Visit | Attending: Family Medicine | Admitting: Family Medicine

## 2015-08-31 ENCOUNTER — Other Ambulatory Visit (HOSPITAL_COMMUNITY): Payer: Self-pay | Admitting: Family Medicine

## 2015-08-31 DIAGNOSIS — R059 Cough, unspecified: Secondary | ICD-10-CM

## 2015-08-31 DIAGNOSIS — M533 Sacrococcygeal disorders, not elsewhere classified: Secondary | ICD-10-CM | POA: Insufficient documentation

## 2015-08-31 DIAGNOSIS — Z1389 Encounter for screening for other disorder: Secondary | ICD-10-CM | POA: Insufficient documentation

## 2015-08-31 DIAGNOSIS — M25552 Pain in left hip: Secondary | ICD-10-CM | POA: Diagnosis not present

## 2015-08-31 DIAGNOSIS — E039 Hypothyroidism, unspecified: Secondary | ICD-10-CM | POA: Diagnosis not present

## 2015-08-31 DIAGNOSIS — R05 Cough: Secondary | ICD-10-CM

## 2015-08-31 DIAGNOSIS — Z6833 Body mass index (BMI) 33.0-33.9, adult: Secondary | ICD-10-CM | POA: Diagnosis not present

## 2015-08-31 DIAGNOSIS — E6609 Other obesity due to excess calories: Secondary | ICD-10-CM | POA: Diagnosis not present

## 2015-09-18 DIAGNOSIS — Z6832 Body mass index (BMI) 32.0-32.9, adult: Secondary | ICD-10-CM | POA: Diagnosis not present

## 2015-09-18 DIAGNOSIS — E6609 Other obesity due to excess calories: Secondary | ICD-10-CM | POA: Diagnosis not present

## 2015-09-18 DIAGNOSIS — I10 Essential (primary) hypertension: Secondary | ICD-10-CM | POA: Diagnosis not present

## 2015-09-18 DIAGNOSIS — R7309 Other abnormal glucose: Secondary | ICD-10-CM | POA: Diagnosis not present

## 2015-09-18 DIAGNOSIS — E782 Mixed hyperlipidemia: Secondary | ICD-10-CM | POA: Diagnosis not present

## 2015-09-18 DIAGNOSIS — R252 Cramp and spasm: Secondary | ICD-10-CM | POA: Diagnosis not present

## 2015-09-18 DIAGNOSIS — Z1389 Encounter for screening for other disorder: Secondary | ICD-10-CM | POA: Diagnosis not present

## 2015-09-20 DIAGNOSIS — I10 Essential (primary) hypertension: Secondary | ICD-10-CM | POA: Diagnosis not present

## 2015-09-20 DIAGNOSIS — Z1389 Encounter for screening for other disorder: Secondary | ICD-10-CM | POA: Diagnosis not present

## 2015-09-20 DIAGNOSIS — Z7901 Long term (current) use of anticoagulants: Secondary | ICD-10-CM | POA: Diagnosis not present

## 2015-09-20 DIAGNOSIS — L0100 Impetigo, unspecified: Secondary | ICD-10-CM | POA: Diagnosis not present

## 2015-09-20 DIAGNOSIS — R7309 Other abnormal glucose: Secondary | ICD-10-CM | POA: Diagnosis not present

## 2015-09-20 DIAGNOSIS — E782 Mixed hyperlipidemia: Secondary | ICD-10-CM | POA: Diagnosis not present

## 2015-09-20 DIAGNOSIS — Z85828 Personal history of other malignant neoplasm of skin: Secondary | ICD-10-CM | POA: Diagnosis not present

## 2015-09-20 DIAGNOSIS — Z08 Encounter for follow-up examination after completed treatment for malignant neoplasm: Secondary | ICD-10-CM | POA: Diagnosis not present

## 2015-10-09 DIAGNOSIS — K625 Hemorrhage of anus and rectum: Secondary | ICD-10-CM | POA: Diagnosis not present

## 2015-10-09 DIAGNOSIS — Z1389 Encounter for screening for other disorder: Secondary | ICD-10-CM | POA: Diagnosis not present

## 2015-10-09 DIAGNOSIS — Z6834 Body mass index (BMI) 34.0-34.9, adult: Secondary | ICD-10-CM | POA: Diagnosis not present

## 2015-10-09 DIAGNOSIS — D6949 Other primary thrombocytopenia: Secondary | ICD-10-CM | POA: Diagnosis not present

## 2015-10-09 DIAGNOSIS — Z5181 Encounter for therapeutic drug level monitoring: Secondary | ICD-10-CM | POA: Diagnosis not present

## 2015-10-13 ENCOUNTER — Encounter: Payer: Self-pay | Admitting: Internal Medicine

## 2015-10-31 ENCOUNTER — Ambulatory Visit (HOSPITAL_COMMUNITY)
Admission: RE | Admit: 2015-10-31 | Discharge: 2015-10-31 | Disposition: A | Discharge status: Home / Self Care | Payer: Medicare Other | Source: Ambulatory Visit | Attending: Family Medicine | Admitting: Family Medicine

## 2015-10-31 ENCOUNTER — Other Ambulatory Visit (HOSPITAL_COMMUNITY): Payer: Self-pay | Admitting: Family Medicine

## 2015-10-31 DIAGNOSIS — W010XXA Fall on same level from slipping, tripping and stumbling without subsequent striking against object, initial encounter: Secondary | ICD-10-CM | POA: Insufficient documentation

## 2015-10-31 DIAGNOSIS — M2578 Osteophyte, vertebrae: Secondary | ICD-10-CM | POA: Insufficient documentation

## 2015-10-31 DIAGNOSIS — M898X8 Other specified disorders of bone, other site: Secondary | ICD-10-CM | POA: Insufficient documentation

## 2015-10-31 DIAGNOSIS — Z6834 Body mass index (BMI) 34.0-34.9, adult: Secondary | ICD-10-CM | POA: Diagnosis not present

## 2015-10-31 DIAGNOSIS — M533 Sacrococcygeal disorders, not elsewhere classified: Secondary | ICD-10-CM

## 2015-10-31 DIAGNOSIS — Z1389 Encounter for screening for other disorder: Secondary | ICD-10-CM | POA: Diagnosis not present

## 2015-11-06 ENCOUNTER — Ambulatory Visit (INDEPENDENT_AMBULATORY_CARE_PROVIDER_SITE_OTHER): Payer: Medicare Other | Admitting: Nurse Practitioner

## 2015-11-06 ENCOUNTER — Encounter: Payer: Self-pay | Admitting: Nurse Practitioner

## 2015-11-06 ENCOUNTER — Telehealth: Payer: Self-pay

## 2015-11-06 VITALS — BP 116/68 | HR 76 | Temp 97.4°F | Ht 73.0 in | Wt 236.0 lb

## 2015-11-06 DIAGNOSIS — Z86010 Personal history of colonic polyps: Secondary | ICD-10-CM

## 2015-11-06 DIAGNOSIS — K921 Melena: Secondary | ICD-10-CM | POA: Diagnosis not present

## 2015-11-06 NOTE — Assessment & Plan Note (Addendum)
Patient with recurrent hematochezia over the past 1-2 months which is becomming heavier. Was given Proctofoam by primary care but was not filled due to expense. He is currently overdue for colonoscopy and we will arrange for this at this time. Stated history of previous colon polyps by previous endoscopist before our office. Return for follow-up in 2 months.  Proceed with TCS on propofol/MAC with Dr. Gala Romney in near future: the risks, benefits, and alternatives have been discussed with the patient in detail. The patient states understanding and desires to proceed.  The patient is on Coumadin. This is managed by his primary care provider Dr. Hilma Favors. We will contact his provider and request permission to hold Coumadin for 4 days prior to procedure and if there is a need for Lovenox bridge.  The patient is also on Trileptal, Lamictal, Vicodin, Flexeril, Celexa, Xanax. We will provide the procedure with propofol to promote adequate sedation.

## 2015-11-06 NOTE — Progress Notes (Signed)
cc'ed to pcp °

## 2015-11-06 NOTE — Telephone Encounter (Signed)
I have faxed the request to Dr. Hilma Favors in reference to holding coumadin for the pt's procedures and if he will need Lovenox Bridge.

## 2015-11-06 NOTE — Assessment & Plan Note (Signed)
Patient states history of colon polyps before coming to see our office. His last colonoscopy did not have any polyps in 2011. Recommended 5 year repeat and he is currently 1 year overdue. We will proceed with colonoscopy as noted above.

## 2015-11-06 NOTE — Progress Notes (Addendum)
Primary Care Physician:  Purvis Kilts, MD Primary Gastroenterologist:  Dr. Gala Romney  Chief Complaint  Patient presents with  . Rectal Bleeding  . Gas    HPI:   Casey [name] is a 70 y.o. male who presents on referral by primary care for rectal bleeding. Last seen in our office 07/27/2009 to schedule colonoscopy. At that time he was having left-sided abdominal pain and hematochezia. Colonoscopy was completed 08/01/2009 and found minimally friable anal canal, left-sided diverticula. Hematochezia deemed likely due to benign anorectal source and pain likely spinal radiculopathy. Recommended repeat colonoscopy in 5 years for history of colon polyps.  Today he states he has a history of colon polyps, notes he has a history of GI bleeding. Blood is in the stool. Is on coumadin for AFib which is managed by Dr. Hilma Favors at [facility]. Started having more bleeding about a month ago, became heavier. No rectal itching/pain. Occasional abdominal pain depending on diet, sometimes associated with fecal urgency. Otherwise normal daily bowel movements. Denies melena, GERD symptoms, dysphagia. Has some rectal leaking at random associated with gas. Did not fill proctofoam due to cost. Denies chest pain, dyspnea, dizziness, lightheadedness, syncope, near syncope. Denies any other upper or lower GI symptoms.   Past Medical History  Diagnosis Date  . Atrial fibrillation ([place]) 01/12/2010    2D Echo EF=>55%  . Depression   . Hyperlipidemia   . Hypertension   . Bipolar disorder ([place])   . Morbid obesity ([place])   . Hypothyroidism   . Prostatitis   . OSA (obstructive sleep apnea) 10/24/2009    AHI was 27.62hr RDI was 34.3 hr REM 0.00hr   . History of colonic polyps     Past Surgical History  Procedure Laterality Date  . Colonoscopy  2011    RMR: left-sided diverticula, minimal rectal friability. No polyps. Repeat 5 years.  . Tonsillectomy  1955  . Appendectomy  1959  . Cataract extraction  1995     Current Outpatient Prescriptions  Medication Sig Dispense Refill  . Albuterol Sulfate 108 (90 Base) MCG/ACT AEPB Inhale into the lungs as needed.    . ALPRAZolam (XANAX) 0.5 MG tablet Take 0.5 mg by mouth at bedtime as needed for anxiety.    . cholecalciferol (VITAMIN D) 1000 UNITS tablet Take 2,000 Units by mouth daily.    . citalopram (CELEXA) 20 MG tablet Take 1 tablet by mouth daily.    . cyclobenzaprine (FLEXERIL) 10 MG tablet     . guaiFENesin-codeine 100-10 MG/5ML syrup     . hydrochlorothiazide (HYDRODIURIL) 25 MG tablet     . HYDROcodone-acetaminophen (VICODIN) 5-500 MG tablet Take by mouth.    . lamoTRIgine (LAMICTAL) 200 MG tablet Take 1 tablet by mouth 2 (two) times daily.     Marland Kitchen levothyroxine (SYNTHROID, LEVOTHROID) 25 MCG tablet Take 1 tablet by mouth daily.    . NON FORMULARY Cream mixture from [employer]    . oxcarbazepine (TRILEPTAL) 600 MG tablet Take 600 mg by mouth 2 (two) times daily.     Marland Kitchen warfarin (COUMADIN) 5 MG tablet Take 10 mg by mouth daily. AS PER INR     No current facility-administered medications for this visit.    Allergies as of 11/06/2015 - Review Complete 11/06/2015  Allergen Reaction Noted  . Amlodipine  11/02/2012    Family History  Problem Relation Age of Onset  . Diabetes Mother   . Cancer Paternal Grandmother   . Heart failure Mother   .  Hypertension Mother   . Hypertension Father   . Colon cancer Neg Hx     Social History   Social History  . Marital Status: Married    Spouse Name: N/A  . Number of Children: N/A  . Years of Education: N/A   Occupational History  . Not on file.   Social History Main Topics  . Smoking status: Never Smoker   . Smokeless tobacco: Never Used  . Alcohol Use: No  . Drug Use: No  . Sexual Activity: Not on file   Other Topics Concern  . Not on file   Social History Narrative    Review of Systems: General: Negative for anorexia, weight loss, fever, chills, fatigue, weakness. ENT:  Negative for hoarseness, difficulty swallowing. CV: Negative for chest pain, angina, palpitations, peripheral edema.  Respiratory: Negative for dyspnea at rest, cough, sputum, wheezing.  GI: See history of present illness. MS: Chronic back pain.  Derm: Negative for rash or itching.  Endo: Negative for unusual weight change.  Heme: Negative for bruising or bleeding. Allergy: Negative for rash or hives.    Physical Exam: BP 116/68 mmHg  Pulse 76  Temp(Src) 97.4 F (36.3 C)  Ht 6\' 1"  (1.854 m)  Wt 236 lb (107.049 kg)  BMI 31.14 kg/m2 General:   Alert and oriented. Pleasant and cooperative. Well-nourished and well-developed.  Head:  Normocephalic and atraumatic. Eyes:  Without icterus, sclera clear and conjunctiva pink.  Ears:  Normal auditory acuity. Cardiovascular:  Irregular rhythm, without murmurs appreciated. Extremities without clubbing or edema. Respiratory:  Clear to auscultation bilaterally. No wheezes, rales, or rhonchi. No distress.  Gastrointestinal:  +BS, rounded but soft, non-tender and non-distended. No HSM noted. No guarding or rebound. No masses appreciated.  Rectal:  Deferred  Musculoskalatal:  Hobbling posture due to chronic back pain. Skin:  Intact without significant lesions or rashes. Neurologic:  Alert and oriented x4; grossly normal neurologically. Psych:  Alert and cooperative. Normal mood and affect. Heme/Lymph/Immune: No excessive bruising noted.    11/06/2015 9:25 AM   Disclaimer: This note was dictated with voice recognition software. Similar sounding words can inadvertently be transcribed and may not be corrected upon review.

## 2015-11-06 NOTE — Telephone Encounter (Signed)
Thanks, let me know the response.

## 2015-11-06 NOTE — Patient Instructions (Signed)
1. We will schedule your procedure for you. 2. We will contact Dr. Hilma Favors for the green light to hold her Coumadin for 4 days prior to colonoscopy. 3. We will also ask if we need to give the Lovenox while you're not on Coumadin. 4. Return for follow-up in 2 months. 5. You can use Preparation H for possible hemorrhoids to see if this helps or bleeding.

## 2015-11-07 DIAGNOSIS — X32XXXD Exposure to sunlight, subsequent encounter: Secondary | ICD-10-CM | POA: Diagnosis not present

## 2015-11-07 DIAGNOSIS — L570 Actinic keratosis: Secondary | ICD-10-CM | POA: Diagnosis not present

## 2015-11-07 DIAGNOSIS — D225 Melanocytic nevi of trunk: Secondary | ICD-10-CM | POA: Diagnosis not present

## 2015-11-07 DIAGNOSIS — Z1283 Encounter for screening for malignant neoplasm of skin: Secondary | ICD-10-CM | POA: Diagnosis not present

## 2015-11-08 NOTE — Telephone Encounter (Signed)
Refaxed the request to Dr. Hilma Favors.

## 2015-11-20 NOTE — Telephone Encounter (Signed)
Faxed for the third time with PLEASE!!!!

## 2015-11-28 NOTE — Telephone Encounter (Signed)
I spoke to Bluebell at [facility] and she had faxed [place] the response from Dr. Hilma Favors, but I had not received it. She will re-fax.

## 2015-11-28 NOTE — Telephone Encounter (Signed)
Randall Hiss, I received a fax from Dr. Hilma Favors of the guidelines for the coumadin. I have placed the copy on your desk.

## 2015-11-28 NOTE — Telephone Encounter (Signed)
I called [facility] [name] and they say they have not received a fax for this request. I have re-faxed to [phone].

## 2015-11-30 NOTE — Telephone Encounter (Signed)
Notify the patient to hold his coumadin for 4 days prior to the procedure, no lovenox bridge. Ok to schedule procedure.

## 2015-11-30 NOTE — Telephone Encounter (Signed)
Noted, thanks!

## 2015-11-30 NOTE — Telephone Encounter (Signed)
Spoke with patients wife and they are aware that pt needs to come in for a NO CHARGE visit per [place] to reschedule his procedure.  Routing to Ray[place] to make him aware.  PT is coming in on 06/06 with Randall Hiss

## 2015-12-06 DIAGNOSIS — Z7901 Long term (current) use of anticoagulants: Secondary | ICD-10-CM | POA: Diagnosis not present

## 2015-12-12 ENCOUNTER — Other Ambulatory Visit (HOSPITAL_COMMUNITY): Payer: Self-pay | Admitting: Family Medicine

## 2015-12-12 ENCOUNTER — Ambulatory Visit (HOSPITAL_COMMUNITY)
Admission: RE | Admit: 2015-12-12 | Discharge: 2015-12-12 | Disposition: A | Discharge status: Home / Self Care | Payer: Medicare Other | Source: Ambulatory Visit | Attending: Family Medicine | Admitting: Family Medicine

## 2015-12-12 DIAGNOSIS — R2241 Localized swelling, mass and lump, right lower limb: Secondary | ICD-10-CM | POA: Diagnosis present

## 2015-12-12 DIAGNOSIS — Z1389 Encounter for screening for other disorder: Secondary | ICD-10-CM | POA: Diagnosis not present

## 2015-12-12 DIAGNOSIS — Z6833 Body mass index (BMI) 33.0-33.9, adult: Secondary | ICD-10-CM | POA: Diagnosis not present

## 2015-12-12 DIAGNOSIS — E6609 Other obesity due to excess calories: Secondary | ICD-10-CM | POA: Diagnosis not present

## 2015-12-12 DIAGNOSIS — M7989 Other specified soft tissue disorders: Secondary | ICD-10-CM | POA: Diagnosis not present

## 2015-12-13 DIAGNOSIS — F319 Bipolar disorder, unspecified: Secondary | ICD-10-CM | POA: Diagnosis not present

## 2015-12-19 ENCOUNTER — Ambulatory Visit: Payer: Medicare Other | Admitting: Nurse Practitioner

## 2015-12-22 DIAGNOSIS — F319 Bipolar disorder, unspecified: Secondary | ICD-10-CM | POA: Diagnosis not present

## 2015-12-25 DIAGNOSIS — Z7901 Long term (current) use of anticoagulants: Secondary | ICD-10-CM | POA: Diagnosis not present

## 2015-12-28 ENCOUNTER — Encounter: Payer: Self-pay | Admitting: Nurse Practitioner

## 2015-12-28 ENCOUNTER — Ambulatory Visit (INDEPENDENT_AMBULATORY_CARE_PROVIDER_SITE_OTHER): Payer: Self-pay | Admitting: Nurse Practitioner

## 2015-12-28 ENCOUNTER — Other Ambulatory Visit: Payer: Self-pay

## 2015-12-28 VITALS — BP 153/86 | HR 92 | Temp 97.2°F | Ht 73.0 in | Wt 235.0 lb

## 2015-12-28 DIAGNOSIS — Z86010 Personal history of colonic polyps: Secondary | ICD-10-CM

## 2015-12-28 DIAGNOSIS — K921 Melena: Secondary | ICD-10-CM

## 2015-12-28 MED ORDER — PEG 3350-KCL-NA BICARB-NACL 420 G PO SOLR: 4000.0000 mL | Freq: Once | ORAL | Status: DC

## 2015-12-28 NOTE — Assessment & Plan Note (Signed)
Due for colonoscopy. This had to be rescheduled due to needing instructions regarding his Coumadin. Received information back from primary care. Recommend holding Coumadin 4 days prior to procedure, no need for Lovenox bridge. We will reschedule his procedure as previous recommended.  Proceed with TCS on propofol/MAC with Dr. Gala Romney in near future: the risks, benefits, and alternatives have been discussed with the patient in detail. The patient states understanding and desires to proceed.  The patient is on Coumadin. This is managed by his primary care provider Dr. Hilma Favors. We will contact his provider and request permission to hold Coumadin for 4 days prior to procedure and if there is a need for Lovenox bridge.  The patient is also on Trileptal, Lamictal, Vicodin, Flexeril, Celexa, Xanax. We will provide the procedure with propofol to promote adequate sedation.

## 2015-12-28 NOTE — Progress Notes (Signed)
Referring Provider: Sharilyn Sites, MD Primary Care Physician:  Purvis Kilts, MD Primary GI:  Dr. Gala Romney  Chief Complaint  Patient presents with  . Colonoscopy    HPI:   Casey [name] is a 70 y.o. male who presents to schedule colonoscopy. Last seen in our office for 20 11/02/2015. At that time noted recurrent and worsening hematochezia over the previous one to 2 months, was given Proctofoam by primary care but was not filled due to expense. He was overdue for colonoscopy at that time and was set up for the colonoscopy on propofol/MAC. Sent request primary care with guidelines for Coumadin management. Procedure had to be canceled and rescheduled due to the last time. Noted okay to hold Coumadin for 4 days prior, no needed Lovenox bridge.  Today he states his bleeding has stopped. Still with fluctuance and occasional anal leakage associated with it. Denies any new abdominal pain, N/V, fever, chills. No other upper or lower GI symptoms.   Past Medical History  Diagnosis Date  . Atrial fibrillation ([place]) 01/12/2010    2D Echo EF=>55%  . Depression   . Hyperlipidemia   . Hypertension   . Bipolar disorder (Casey [name])   . Morbid obesity (Casey [name])   . Hypothyroidism   . Prostatitis   . OSA (obstructive sleep apnea) 10/24/2009    AHI was 27.62hr RDI was 34.3 hr REM 0.00hr   . History of colonic polyps     Past Surgical History  Procedure Laterality Date  . Colonoscopy  2011    RMR: left-sided diverticula, minimal rectal friability. No polyps. Repeat 5 years.  . Tonsillectomy  1955  . Appendectomy  1959  . Cataract extraction  1995    Current Outpatient Prescriptions  Medication Sig Dispense Refill  . Albuterol Sulfate 108 (90 Base) MCG/ACT AEPB Inhale into the lungs as needed.    . ALPRAZolam (XANAX) 0.5 MG tablet Take 0.5 mg by mouth at bedtime as needed for anxiety.    . cholecalciferol (VITAMIN D) 1000 UNITS tablet Take 2,000 Units by mouth daily.    . citalopram (CELEXA) 20  MG tablet Take 1 tablet by mouth daily.    . cyclobenzaprine (FLEXERIL) 10 MG tablet     . guaiFENesin-codeine 100-10 MG/5ML syrup     . hydrochlorothiazide (HYDRODIURIL) 25 MG tablet     . HYDROcodone-acetaminophen (VICODIN) 5-500 MG tablet Take by mouth.    . lamoTRIgine (LAMICTAL) 200 MG tablet Take 1 tablet by mouth 2 (two) times daily.     Marland Kitchen levothyroxine (SYNTHROID, LEVOTHROID) 25 MCG tablet Take 1 tablet by mouth daily.    . mupirocin ointment (BACTROBAN) 2 %     . NON FORMULARY Cream mixture from [employer]    . oxcarbazepine (TRILEPTAL) 600 MG tablet Take 600 mg by mouth 2 (two) times daily.     Marland Kitchen triamcinolone cream (KENALOG) 0.1 %     . warfarin (COUMADIN) 5 MG tablet Take 10 mg by mouth daily. AS PER INR     No current facility-administered medications for this visit.    Allergies as of 12/28/2015 - Review Complete 12/28/2015  Allergen Reaction Noted  . Amlodipine  11/02/2012    Family History  Problem Relation Age of Onset  . Diabetes Mother   . Cancer Paternal Grandmother   . Heart failure Mother   . Hypertension Mother   . Hypertension Father   . Colon cancer Neg Hx     Social History   Social History  .  Marital Status: Married    Spouse Name: N/A  . Number of Children: N/A  . Years of Education: N/A   Social History Main Topics  . Smoking status: Never Smoker   . Smokeless tobacco: Never Used  . Alcohol Use: No  . Drug Use: No  . Sexual Activity: Not Asked   Other Topics Concern  . None   Social History Narrative    Review of Systems: General: Negative for anorexia, weight loss, fever, chills, fatigue, weakness.  ENT: Negative for hoarseness, difficulty swallowing. CV: Negative for chest pain, angina, palpitations,  peripheral edema.  Respiratory: Negative for dyspnea at rest, cough, sputum, wheezing.  GI: See history of present illness. MS: Chronic back pain.  Endo: Negative for unusual weight change.  Heme: Negative for bruising  or bleeding.   Physical Exam: BP 153/86 mmHg  Pulse 92  Temp(Src) 97.2 F (36.2 C) (Oral)  Ht 6\' 1"  (1.854 m)  Wt 235 lb (106.595 kg)  BMI 31.01 kg/m2 General: Alert and oriented. Pleasant and cooperative. Well-nourished and well-developed.  Head: Normocephalic and atraumatic. Eyes: Without icterus, sclera clear and conjunctiva pink.  Ears: Normal auditory acuity. Cardiovascular: Irregular rhythm, without murmurs appreciated. Extremities without clubbing or edema. Respiratory: Clear to auscultation bilaterally. No wheezes, rales, or rhonchi. No distress.  Gastrointestinal: +BS, rounded but soft, non-tender and non-distended. No HSM noted. No guarding or rebound. No masses appreciated.  Rectal: Deferred  Musculoskalatal: Hobbling posture due to chronic back pain. Skin: Intact without significant lesions or rashes. Neurologic: Alert and oriented x4; grossly normal neurologically. Psych: Alert and cooperative. Normal mood and affect. Heme/Lymph/Immune: No excessive bruising noted.   12/28/2015 9:25 AM   Disclaimer: This note was dictated with voice recognition software. Similar sounding words can inadvertently be transcribed and may not be corrected upon review.

## 2015-12-28 NOTE — Patient Instructions (Signed)
1. We will schedule your procedure for you 2. Stop taking Coumadin for 4 days prior to your procedure. 3. We will restart your coumadin after your procedure, based on your results.

## 2015-12-28 NOTE — Progress Notes (Signed)
cc'ed to pcp °

## 2015-12-28 NOTE — Assessment & Plan Note (Signed)
Rectal bleeding has stopped for the time being. Is still due for colonoscopy and we'll proceed as previously recommended as per above. Return for follow-up visit on postprocedure recommendations.

## 2016-01-01 DIAGNOSIS — E782 Mixed hyperlipidemia: Secondary | ICD-10-CM | POA: Diagnosis not present

## 2016-01-01 DIAGNOSIS — Z1389 Encounter for screening for other disorder: Secondary | ICD-10-CM | POA: Diagnosis not present

## 2016-01-01 DIAGNOSIS — I10 Essential (primary) hypertension: Secondary | ICD-10-CM | POA: Diagnosis not present

## 2016-01-01 DIAGNOSIS — E6609 Other obesity due to excess calories: Secondary | ICD-10-CM | POA: Diagnosis not present

## 2016-01-01 DIAGNOSIS — Z6833 Body mass index (BMI) 33.0-33.9, adult: Secondary | ICD-10-CM | POA: Diagnosis not present

## 2016-01-01 DIAGNOSIS — Z7901 Long term (current) use of anticoagulants: Secondary | ICD-10-CM | POA: Diagnosis not present

## 2016-01-04 DIAGNOSIS — F319 Bipolar disorder, unspecified: Secondary | ICD-10-CM | POA: Diagnosis not present

## 2016-01-05 DIAGNOSIS — Z7901 Long term (current) use of anticoagulants: Secondary | ICD-10-CM | POA: Diagnosis not present

## 2016-01-05 NOTE — Patient Instructions (Signed)
Casey [name]  01/05/2016     @PREFPERIOPPHARMACY @   Your procedure is scheduled on  01/15/2016   Report to [facility] at  1130  A.M.  Call this number if you have problems the morning of surgery:  [phone]   Remember:  Do not eat food or drink liquids after midnight.  Take these medicines the morning of surgery with A SIP OF WATER  Xanax, celexa, vicodin, levothyroxine. Take your inhaler before you come.   Do not wear jewelry, make-up or nail polish.  Do not wear lotions, powders, or perfumes.  You may wear deoderant.  Do not shave 48 hours prior to surgery.  Men may shave face and neck.  Do not bring valuables to the hospital.  [facility] is not responsible for any belongings or valuables.  Contacts, dentures or bridgework may not be worn into surgery.  Leave your suitcase in the car.  After surgery it may be brought to your room.  For patients admitted to the hospital, discharge time will be determined by your treatment team.  Patients discharged the day of surgery will not be allowed to drive home.   Name and phone number of your driver:   family Special instructions:  Follow the diet and prep instructions given to you by Dr Roseanne Kaufman office.  Please read over the following fact sheets that you were given. Coughing and Deep Breathing, Surgical Site Infection Prevention, Anesthesia Post-op Instructions and Care and Recovery After Surgery      Colonoscopy A colonoscopy is an exam to look at the entire large intestine (colon). This exam can help find problems such as tumors, polyps, inflammation, and areas of bleeding. The exam takes about 1 hour.  LET [facility] CARE PROVIDER KNOW ABOUT:   Any allergies you have.  All medicines you are taking, including vitamins, herbs, eye drops, creams, and over-the-counter medicines.  Previous problems you or members of your family have had with the use of anesthetics.  Any blood disorders you have.  Previous  surgeries you have had.  Medical conditions you have. RISKS AND COMPLICATIONS  Generally, this is a safe procedure. However, as with any procedure, complications can occur. Possible complications include:  Bleeding.  Tearing or rupture of the colon wall.  Reaction to medicines given during the exam.  Infection (rare). BEFORE THE PROCEDURE   Ask your health care provider about changing or stopping your regular medicines.  You may be prescribed an oral bowel prep. This involves drinking a large amount of medicated liquid, starting the day before your procedure. The liquid will cause you to have multiple loose stools until your stool is almost clear or light green. This cleans out your colon in preparation for the procedure.  Do not eat or drink anything else once you have started the bowel prep, unless your health care provider tells you it is safe to do so.  Arrange for someone to drive you home after the procedure. PROCEDURE   You will be given medicine to help you relax (sedative).  You will lie on your side with your knees bent.  A long, flexible tube with a light and camera on the end (colonoscope) will be inserted through the rectum and into the colon. The camera sends video back to a computer screen as it moves through the colon. The colonoscope also releases carbon dioxide gas to inflate the colon. This helps your health care provider see the  area better.  During the exam, your health care provider may take a small tissue sample (biopsy) to be examined under a microscope if any abnormalities are found.  The exam is finished when the entire colon has been viewed. AFTER THE PROCEDURE   Do not drive for 24 hours after the exam.  You may have a small amount of blood in your stool.  You may pass moderate amounts of gas and have mild abdominal cramping or bloating. This is caused by the gas used to inflate your colon during the exam.  Ask when your test results will be ready  and how you will get your results. Make sure you get your test results.   This information is not intended to replace advice given to you by your health care provider. Make sure you discuss any questions you have with your health care provider.   Document Released: 06/28/2000 Document Revised: 04/21/2013 Document Reviewed: 03/08/2013 Elsevier Interactive Patient Education 2016 [employer] Colonoscopy, Care After Refer to this sheet in the next few weeks. These instructions provide you with information on caring for yourself after your procedure. Your health care provider may also give you more specific instructions. Your treatment has been planned according to current medical practices, but problems sometimes occur. Call your health care provider if you have any problems or questions after your procedure. WHAT TO EXPECT AFTER THE PROCEDURE  After your procedure, it is typical to have the following:  A small amount of blood in your stool.  Moderate amounts of gas and mild abdominal cramping or bloating. HOME CARE INSTRUCTIONS  Do not drive, operate machinery, or sign important documents for 24 hours.  You may shower and resume your regular physical activities, but move at a slower pace for the first 24 hours.  Take frequent rest periods for the first 24 hours.  Walk around or put a warm pack on your abdomen to help reduce abdominal cramping and bloating.  Drink enough fluids to keep your urine clear or pale yellow.  You may resume your normal diet as instructed by your health care provider. Avoid heavy or fried foods that are hard to digest.  Avoid drinking alcohol for 24 hours or as instructed by your health care provider.  Only take over-the-counter or prescription medicines as directed by your health care provider.  If a tissue sample (biopsy) was taken during your procedure:  Do not take aspirin or blood thinners for 7 days, or as instructed by your health care provider.  Do  not drink alcohol for 7 days, or as instructed by your health care provider.  Eat soft foods for the first 24 hours. SEEK MEDICAL CARE IF: You have persistent spotting of blood in your stool 2-3 days after the procedure. SEEK IMMEDIATE MEDICAL CARE IF:  You have more than a small spotting of blood in your stool.  You pass large blood clots in your stool.  Your abdomen is swollen (distended).  You have nausea or vomiting.  You have a fever.  You have increasing abdominal pain that is not relieved with medicine.   This information is not intended to replace advice given to you by your health care provider. Make sure you discuss any questions you have with your health care provider.   Document Released: 02/13/2004 Document Revised: 04/21/2013 Document Reviewed: 03/08/2013 Elsevier Interactive Patient Education 2016 [employer] PATIENT INSTRUCTIONS POST-ANESTHESIA  IMMEDIATELY FOLLOWING SURGERY:  Do not drive or operate machinery for the first twenty four hours after  surgery.  Do not make any important decisions for twenty four hours after surgery or while taking narcotic pain medications or sedatives.  If you develop intractable nausea and vomiting or a severe headache please notify your doctor immediately.  FOLLOW-UP:  Please make an appointment with your surgeon as instructed. You do not need to follow up with anesthesia unless specifically instructed to do so.  WOUND CARE INSTRUCTIONS (if applicable):  Keep a dry clean dressing on the anesthesia/puncture wound site if there is drainage.  Once the wound has quit draining you may leave it open to air.  Generally you should leave the bandage intact for twenty four hours unless there is drainage.  If the epidural site drains for more than 36-48 hours please call the anesthesia department.  QUESTIONS?:  Please feel free to call your physician or the hospital operator if you have any questions, and they will be happy to assist you.

## 2016-01-08 ENCOUNTER — Encounter (HOSPITAL_COMMUNITY)
Admission: RE | Admit: 2016-01-08 | Discharge: 2016-01-08 | Disposition: A | Discharge status: Home / Self Care | Payer: Medicare Other | Source: Ambulatory Visit | Attending: Internal Medicine | Admitting: Internal Medicine

## 2016-01-08 ENCOUNTER — Other Ambulatory Visit: Payer: Self-pay

## 2016-01-08 ENCOUNTER — Encounter (HOSPITAL_COMMUNITY): Payer: Self-pay

## 2016-01-08 DIAGNOSIS — Z7901 Long term (current) use of anticoagulants: Secondary | ICD-10-CM | POA: Diagnosis not present

## 2016-01-08 DIAGNOSIS — Z01810 Encounter for preprocedural cardiovascular examination: Secondary | ICD-10-CM | POA: Diagnosis not present

## 2016-01-08 DIAGNOSIS — Z6833 Body mass index (BMI) 33.0-33.9, adult: Secondary | ICD-10-CM | POA: Diagnosis not present

## 2016-01-08 DIAGNOSIS — Z01812 Encounter for preprocedural laboratory examination: Secondary | ICD-10-CM | POA: Diagnosis not present

## 2016-01-08 DIAGNOSIS — E6609 Other obesity due to excess calories: Secondary | ICD-10-CM | POA: Diagnosis not present

## 2016-01-08 DIAGNOSIS — I4891 Unspecified atrial fibrillation: Secondary | ICD-10-CM | POA: Diagnosis not present

## 2016-01-08 HISTORY — DX: Unspecified osteoarthritis, unspecified site: M19.90

## 2016-01-08 HISTORY — DX: Cardiac arrhythmia, unspecified: I49.9

## 2016-01-08 HISTORY — DX: Personal history of other diseases of the musculoskeletal system and connective tissue: Z87.39

## 2016-01-08 HISTORY — DX: Dorsalgia, unspecified: M54.9

## 2016-01-08 HISTORY — DX: Other chronic pain: G89.29

## 2016-01-08 HISTORY — DX: Chronic obstructive pulmonary disease, unspecified: J44.9

## 2016-01-08 LAB — BASIC METABOLIC PANEL
Anion gap: 6 (ref 5–15)
BUN: 17 mg/dL (ref 6–20)
CALCIUM: 9 mg/dL (ref 8.9–10.3)
CHLORIDE: 101 mmol/L (ref 101–111)
CO2: 28 mmol/L (ref 22–32)
CREATININE: 0.96 mg/dL (ref 0.61–1.24)
GFR calc Af Amer: >60 mL/min (ref >60)
GFR calc non Af Amer: >60 mL/min (ref >60)
GLUCOSE: 103 mg/dL — AB (ref 65–99)
Potassium: 3.8 mmol/L (ref 3.5–5.1)
Sodium: 135 mmol/L (ref 135–145)

## 2016-01-08 LAB — CBC WITH DIFFERENTIAL/PLATELET
Basophils Absolute: 0 10*3/uL (ref 0.0–0.1)
Basophils Relative: 0 %
Eosinophils Absolute: 0.1 10*3/uL (ref 0.0–0.7)
Eosinophils Relative: 3 %
HEMATOCRIT: 40.4 % (ref 39.0–52.0)
HEMOGLOBIN: 13.8 g/dL (ref 13.0–17.0)
LYMPHS PCT: 23 %
Lymphs Abs: 1 10*3/uL (ref 0.7–4.0)
MCH: 32 pg (ref 26.0–34.0)
MCHC: 34.2 g/dL (ref 30.0–36.0)
MCV: 93.7 fL (ref 78.0–100.0)
MONO ABS: 0.3 10*3/uL (ref 0.1–1.0)
MONOS PCT: 8 %
NEUTROS ABS: 2.9 10*3/uL (ref 1.7–7.7)
Neutrophils Relative %: 66 %
Platelets: 141 10*3/uL — ABNORMAL LOW (ref 150–400)
RBC: 4.31 MIL/uL (ref 4.22–5.81)
RDW: 12.7 % (ref 11.5–15.5)
WBC: 4.3 10*3/uL (ref 4.0–10.5)

## 2016-01-08 NOTE — Pre-Procedure Instructions (Signed)
Patient given information to sign up for my chart at home. 

## 2016-01-09 ENCOUNTER — Ambulatory Visit: Payer: Medicare Other | Admitting: Nurse Practitioner

## 2016-01-10 DIAGNOSIS — Z7901 Long term (current) use of anticoagulants: Secondary | ICD-10-CM | POA: Diagnosis not present

## 2016-01-11 ENCOUNTER — Telehealth: Payer: Self-pay | Admitting: Internal Medicine

## 2016-01-11 NOTE — Telephone Encounter (Signed)
Pt's wife called to ask questions about patient's prep instructions. She said that the procedure time had been changed and wanted to verify that and also needed to know if the times on his prep sheet needed to be changed as well. [id number] or [phone]

## 2016-01-11 NOTE — Telephone Encounter (Signed)
Talked with the wife and she is going to come by to pick up new instructions

## 2016-01-13 DIAGNOSIS — D126 Benign neoplasm of colon, unspecified: Secondary | ICD-10-CM

## 2016-01-13 HISTORY — DX: Benign neoplasm of colon, unspecified: D12.6

## 2016-01-15 ENCOUNTER — Ambulatory Visit (HOSPITAL_COMMUNITY): Payer: Medicare Other | Admitting: Anesthesiology

## 2016-01-15 ENCOUNTER — Ambulatory Visit (HOSPITAL_COMMUNITY)
Admission: RE | Admit: 2016-01-15 | Discharge: 2016-01-15 | Disposition: A | Discharge status: Home / Self Care | Payer: Medicare Other | Source: Ambulatory Visit | Attending: Internal Medicine | Admitting: Internal Medicine

## 2016-01-15 ENCOUNTER — Encounter (HOSPITAL_COMMUNITY)
Admission: RE | Disposition: A | Discharge status: Home / Self Care | Payer: Self-pay | Source: Ambulatory Visit | Attending: Internal Medicine

## 2016-01-15 DIAGNOSIS — Z86010 Personal history of colonic polyps: Secondary | ICD-10-CM | POA: Insufficient documentation

## 2016-01-15 DIAGNOSIS — E785 Hyperlipidemia, unspecified: Secondary | ICD-10-CM | POA: Diagnosis not present

## 2016-01-15 DIAGNOSIS — D123 Benign neoplasm of transverse colon: Secondary | ICD-10-CM | POA: Diagnosis not present

## 2016-01-15 DIAGNOSIS — F319 Bipolar disorder, unspecified: Secondary | ICD-10-CM | POA: Insufficient documentation

## 2016-01-15 DIAGNOSIS — G4733 Obstructive sleep apnea (adult) (pediatric): Secondary | ICD-10-CM | POA: Diagnosis not present

## 2016-01-15 DIAGNOSIS — K5730 Diverticulosis of large intestine without perforation or abscess without bleeding: Secondary | ICD-10-CM | POA: Diagnosis not present

## 2016-01-15 DIAGNOSIS — I10 Essential (primary) hypertension: Secondary | ICD-10-CM | POA: Diagnosis not present

## 2016-01-15 DIAGNOSIS — K621 Rectal polyp: Secondary | ICD-10-CM

## 2016-01-15 DIAGNOSIS — Z7901 Long term (current) use of anticoagulants: Secondary | ICD-10-CM | POA: Insufficient documentation

## 2016-01-15 DIAGNOSIS — K640 First degree hemorrhoids: Secondary | ICD-10-CM

## 2016-01-15 DIAGNOSIS — E039 Hypothyroidism, unspecified: Secondary | ICD-10-CM | POA: Diagnosis not present

## 2016-01-15 DIAGNOSIS — K921 Melena: Secondary | ICD-10-CM | POA: Diagnosis not present

## 2016-01-15 DIAGNOSIS — J449 Chronic obstructive pulmonary disease, unspecified: Secondary | ICD-10-CM | POA: Insufficient documentation

## 2016-01-15 DIAGNOSIS — I4891 Unspecified atrial fibrillation: Secondary | ICD-10-CM | POA: Diagnosis not present

## 2016-01-15 DIAGNOSIS — Z860101 Personal history of adenomatous and serrated colon polyps: Secondary | ICD-10-CM | POA: Insufficient documentation

## 2016-01-15 HISTORY — PX: POLYPECTOMY: SHX5525

## 2016-01-15 HISTORY — PX: COLONOSCOPY WITH PROPOFOL: SHX5780

## 2016-01-15 SURGERY — COLONOSCOPY WITH PROPOFOL
Anesthesia: Monitor Anesthesia Care

## 2016-01-15 MED ORDER — PROPOFOL 10 MG/ML IV BOLUS
INTRAVENOUS | Status: AC
  Filled 2016-01-15: qty 40

## 2016-01-15 MED ORDER — PROPOFOL 500 MG/50ML IV EMUL
INTRAVENOUS | Status: DC | PRN
  Administered 2016-01-15: 100 ug/kg/min via INTRAVENOUS
  Administered 2016-01-15: 09:00:00 via INTRAVENOUS

## 2016-01-15 MED ORDER — FENTANYL CITRATE (PF) 100 MCG/2ML IJ SOLN: 25.0000 ug | INTRAMUSCULAR | Status: DC | PRN

## 2016-01-15 MED ORDER — MIDAZOLAM HCL 2 MG/2ML IJ SOLN
1.0000 mg | INTRAMUSCULAR | Status: DC | PRN
  Administered 2016-01-15 (×2): 1 mg via INTRAVENOUS

## 2016-01-15 MED ORDER — ONDANSETRON HCL 4 MG/2ML IJ SOLN
4.0000 mg | Freq: Once | INTRAMUSCULAR | Status: DC | PRN
Start: 2016-01-15 — End: 2016-01-15

## 2016-01-15 MED ORDER — MIDAZOLAM HCL 2 MG/2ML IJ SOLN
INTRAMUSCULAR | Status: AC
  Filled 2016-01-15: qty 2

## 2016-01-15 MED ORDER — LACTATED RINGERS IV SOLN
INTRAVENOUS | Status: DC
  Administered 2016-01-15: 1000 mL via INTRAVENOUS

## 2016-01-15 MED ORDER — FENTANYL CITRATE (PF) 100 MCG/2ML IJ SOLN
INTRAMUSCULAR | Status: AC
  Filled 2016-01-15: qty 2

## 2016-01-15 MED ORDER — FENTANYL CITRATE (PF) 100 MCG/2ML IJ SOLN
25.0000 ug | INTRAMUSCULAR | Status: DC | PRN
  Administered 2016-01-15: 25 ug via INTRAVENOUS

## 2016-01-15 MED ORDER — MIDAZOLAM HCL 5 MG/5ML IJ SOLN
INTRAMUSCULAR | Status: DC | PRN
  Administered 2016-01-15: 2 mg via INTRAVENOUS

## 2016-01-15 NOTE — Anesthesia Postprocedure Evaluation (Signed)
Anesthesia Post Note  Patient: Casey [name]  Procedure(s) Performed: Procedure(s) (LRB): COLONOSCOPY WITH PROPOFOL (N/A) POLYPECTOMY  Patient location during evaluation: PACU Anesthesia Type: MAC Level of consciousness: awake and patient cooperative Pain management: pain level controlled Vital Signs Assessment: post-procedure vital signs reviewed and stable Respiratory status: spontaneous breathing, nonlabored ventilation and respiratory function stable Cardiovascular status: blood pressure returned to baseline Postop Assessment: no signs of nausea or vomiting Anesthetic complications: no    Last Vitals:  Filed Vitals:   01/15/16 0825 01/15/16 0830  BP: 142/76 146/87  Pulse:    Temp:    Resp: 18 18    Last Pain: There were no vitals filed for this visit.               Chaniece Barbato J

## 2016-01-15 NOTE — Interval H&P Note (Signed)
History and Physical Interval Note:  01/15/2016 8:29 AM  Casey [name]  has presented today for surgery, with the diagnosis of history of colon polyps, hematochezia  The various methods of treatment have been discussed with the patient and family. After consideration of risks, benefits and other options for treatment, the patient has consented to  Procedure(s) with comments: COLONOSCOPY WITH PROPOFOL (N/A) - 1300 - moved to 11:15 - office calling pt with 9:45 arrival time as a surgical intervention .  The patient's history has been reviewed, patient examined, no change in status, stable for surgery.  I have reviewed the patient's chart and labs.  Questions were answered to the patient's satisfaction.     Abdulhamid Olgin  No change. Bleeding seems to have tapered off since he was seen in the office. Last Coumadin 4 days ago. Diagnostic colonoscopy today per plan.  The risks, benefits, limitations, alternatives and imponderables have been reviewed with the patient. Questions have been answered. All parties are agreeable.

## 2016-01-15 NOTE — Discharge Instructions (Signed)
Colonoscopy Discharge Instructions  Read the instructions outlined below and refer to this sheet in the next few weeks. These discharge instructions provide you with general information on caring for yourself after you leave the hospital. Your doctor may also give you specific instructions. While your treatment has been planned according to the most current medical practices available, unavoidable complications occasionally occur. If you have any problems or questions after discharge, call Dr. Gala Romney at [phone]. ACTIVITY  You may resume your regular activity, but move at a slower pace for the next 24 hours.   Take frequent rest periods for the next 24 hours.   Walking will help get rid of the air and reduce the bloated feeling in your belly (abdomen).   No driving for 24 hours (because of the medicine (anesthesia) used during the test).    Do not sign any important legal documents or operate any machinery for 24 hours (because of the anesthesia used during the test).  NUTRITION  Drink plenty of fluids.   You may resume your normal diet as instructed by your doctor.   Begin with a light meal and progress to your normal diet. Heavy or fried foods are harder to digest and may make you feel sick to your stomach (nauseated).   Avoid alcoholic beverages for 24 hours or as instructed.  MEDICATIONS  You may resume your normal medications unless your doctor tells you otherwise.  WHAT YOU CAN EXPECT TODAY  Some feelings of bloating in the abdomen.   Passage of more gas than usual.   Spotting of blood in your stool or on the toilet paper.  IF YOU HAD POLYPS REMOVED DURING THE COLONOSCOPY:  No aspirin products for 7 days or as instructed.   No alcohol for 7 days or as instructed.   Eat a soft diet for the next 24 hours.  FINDING OUT THE RESULTS OF YOUR TEST Not all test results are available during your visit. If your test results are not back during the visit, make an appointment  with your caregiver to find out the results. Do not assume everything is normal if you have not heard from your caregiver or the medical facility. It is important for you to follow up on all of your test results.  SEEK IMMEDIATE MEDICAL ATTENTION IF:  You have more than a spotting of blood in your stool.   Your belly is swollen (abdominal distention).   You are nauseated or vomiting.   You have a temperature over 101.   You have abdominal pain or discomfort that is severe or gets worse throughout the day.   Colon polyp, diverticulosis and hemorrhoid information provided  Begin Benefiber 1 tablespoon twice daily  Further recommendations to follow pending review of pathology report  Resume Coumadin today      PATIENT INSTRUCTIONS POST-ANESTHESIA  IMMEDIATELY FOLLOWING SURGERY:  Do not drive or operate machinery for the first twenty four hours after surgery.  Do not make any important decisions for twenty four hours after surgery or while taking narcotic pain medications or sedatives.  If you develop intractable nausea and vomiting or a severe headache please notify your doctor immediately.  FOLLOW-UP:  Please make an appointment with your surgeon as instructed. You do not need to follow up with anesthesia unless specifically instructed to do so.  WOUND CARE INSTRUCTIONS (if applicable):  Keep a dry clean dressing on the anesthesia/puncture wound site if there is drainage.  Once the wound has quit draining you may leave  it open to air.  Generally you should leave the bandage intact for twenty four hours unless there is drainage.  If the epidural site drains for more than 36-48 hours please call the anesthesia department.  QUESTIONS?:  Please feel free to call your physician or the hospital operator if you have any questions, and they will be happy to assist you.       Colon Polyps Polyps are lumps of extra tissue growing inside the body. Polyps can grow in the large intestine  (colon). Most colon polyps are noncancerous (benign). However, some colon polyps can become cancerous over time. Polyps that are larger than a pea may be harmful. To be safe, caregivers remove and test all polyps. CAUSES  Polyps form when mutations in the genes cause your cells to grow and divide even though no more tissue is needed. RISK FACTORS There are a number of risk factors that can increase your chances of getting colon polyps. They include:  Being older than 50 years.  Family history of colon polyps or colon cancer.  Long-term colon diseases, such as colitis or Crohn disease.  Being overweight.  Smoking.  Being inactive.  Drinking too much alcohol. SYMPTOMS  Most small polyps do not cause symptoms. If symptoms are present, they may include:  Blood in the stool. The stool may look dark red or black.  Constipation or diarrhea that lasts longer than 1 week. DIAGNOSIS People often do not know they have polyps until their caregiver finds them during a regular checkup. Your caregiver can use 4 tests to check for polyps:  Digital rectal exam. The caregiver wears gloves and feels inside the rectum. This test would find polyps only in the rectum.  Barium enema. The caregiver puts a liquid called barium into your rectum before taking X-rays of your colon. Barium makes your colon look white. Polyps are dark, so they are easy to see in the X-ray pictures.  Sigmoidoscopy. A thin, flexible tube (sigmoidoscope) is placed into your rectum. The sigmoidoscope has a light and tiny camera in it. The caregiver uses the sigmoidoscope to look at the last third of your colon.  Colonoscopy. This test is like sigmoidoscopy, but the caregiver looks at the entire colon. This is the most common method for finding and removing polyps. TREATMENT  Any polyps will be removed during a sigmoidoscopy or colonoscopy. The polyps are then tested for cancer. PREVENTION  To help lower your risk of getting more  colon polyps:  Eat plenty of fruits and vegetables. Avoid eating fatty foods.  Do not smoke.  Avoid drinking alcohol.  Exercise every day.  Lose weight if recommended by your caregiver.  Eat plenty of calcium and folate. Foods that are rich in calcium include milk, cheese, and broccoli. Foods that are rich in folate include chickpeas, kidney beans, and spinach. HOME CARE INSTRUCTIONS Keep all follow-up appointments as directed by your caregiver. You may need periodic exams to check for polyps. SEEK MEDICAL CARE IF: You notice bleeding during a bowel movement.   This information is not intended to replace advice given to you by your health care provider. Make sure you discuss any questions you have with your health care provider.   Document Released: 03/27/2004 Document Revised: 07/22/2014 Document Reviewed: 09/10/2011 Elsevier Interactive Patient Education 2016 [employer].  Diverticulosis Diverticulosis is the condition that develops when small pouches (diverticula) form in the wall of your colon. Your colon, or large intestine, is where water is absorbed and stool is  formed. The pouches form when the inside layer of your colon pushes through weak spots in the outer layers of your colon. CAUSES  No one knows exactly what causes diverticulosis. RISK FACTORS  Being older than 37. Your risk for this condition increases with age. Diverticulosis is rare in people younger than 40 years. By age 32, almost everyone has it.  Eating a low-fiber diet.  Being frequently constipated.  Being overweight.  Not getting enough exercise.  Smoking.  Taking over-the-counter pain medicines, like aspirin and ibuprofen. SYMPTOMS  Most people with diverticulosis do not have symptoms. DIAGNOSIS  Because diverticulosis often has no symptoms, health care providers often discover the condition during an exam for other colon problems. In many cases, a health care provider will diagnose  diverticulosis while using a flexible scope to examine the colon (colonoscopy). TREATMENT  If you have never developed an infection related to diverticulosis, you may not need treatment. If you have had an infection before, treatment may include:  Eating more fruits, vegetables, and grains.  Taking a fiber supplement.  Taking a live bacteria supplement (probiotic).  Taking medicine to relax your colon. HOME CARE INSTRUCTIONS   Drink at least 6-8 glasses of water each day to prevent constipation.  Try not to strain when you have a bowel movement.  Keep all follow-up appointments. If you have had an infection before:  Increase the fiber in your diet as directed by your health care provider or dietitian.  Take a dietary fiber supplement if your health care provider approves.  Only take medicines as directed by your health care provider. SEEK MEDICAL CARE IF:   You have abdominal pain.  You have bloating.  You have cramps.  You have not gone to the bathroom in 3 days. SEEK IMMEDIATE MEDICAL CARE IF:   Your pain gets worse.  Yourbloating becomes very bad.  You have a fever or chills, and your symptoms suddenly get worse.  You begin vomiting.  You have bowel movements that are bloody or black. MAKE SURE YOU:  Understand these instructions.  Will watch your condition.  Will get help right away if you are not doing well or get worse.   This information is not intended to replace advice given to you by your health care provider. Make sure you discuss any questions you have with your health care provider.   Document Released: 03/28/2004 Document Revised: 07/06/2013 Document Reviewed: 05/26/2013 Elsevier Interactive Patient Education 2016 [employer].   Hemorrhoids Hemorrhoids are swollen veins around the rectum or anus. There are two types of hemorrhoids:   Internal hemorrhoids. These occur in the veins just inside the rectum. They may poke through to the  outside and become irritated and painful.  External hemorrhoids. These occur in the veins outside the anus and can be felt as a painful swelling or hard lump near the anus. CAUSES  Pregnancy.   Obesity.   Constipation or diarrhea.   Straining to have a bowel movement.   Sitting for long periods on the toilet.  Heavy lifting or other activity that caused you to strain.  Anal intercourse. SYMPTOMS   Pain.   Anal itching or irritation.   Rectal bleeding.   Fecal leakage.   Anal swelling.   One or more lumps around the anus.  DIAGNOSIS  Your caregiver may be able to diagnose hemorrhoids by visual examination. Other examinations or tests that may be performed include:   Examination of the rectal area with a gloved hand (digital  rectal exam).   Examination of anal canal using a small tube (scope).   A blood test if you have lost a significant amount of blood.  A test to look inside the colon (sigmoidoscopy or colonoscopy). TREATMENT Most hemorrhoids can be treated at home. However, if symptoms do not seem to be getting better or if you have a lot of rectal bleeding, your caregiver may perform a procedure to help make the hemorrhoids get smaller or remove them completely. Possible treatments include:   Placing a rubber band at the base of the hemorrhoid to cut off the circulation (rubber band ligation).   Injecting a chemical to shrink the hemorrhoid (sclerotherapy).   Using a tool to burn the hemorrhoid (infrared light therapy).   Surgically removing the hemorrhoid (hemorrhoidectomy).   Stapling the hemorrhoid to block blood flow to the tissue (hemorrhoid stapling).  HOME CARE INSTRUCTIONS   Eat foods with fiber, such as whole grains, beans, nuts, fruits, and vegetables. Ask your doctor about taking products with added fiber in them (fibersupplements).  Increase fluid intake. Drink enough water and fluids to keep your urine clear or pale yellow.    Exercise regularly.   Go to the bathroom when you have the urge to have a bowel movement. Do not wait.   Avoid straining to have bowel movements.   Keep the anal area dry and clean. Use wet toilet paper or moist towelettes after a bowel movement.   Medicated creams and suppositories may be used or applied as directed.   Only take over-the-counter or prescription medicines as directed by your caregiver.   Take warm sitz baths for 15-20 minutes, 3-4 times a day to ease pain and discomfort.   Place ice packs on the hemorrhoids if they are tender and swollen. Using ice packs between sitz baths may be helpful.   Put ice in a plastic bag.   Place a towel between your skin and the bag.   Leave the ice on for 15-20 minutes, 3-4 times a day.   Do not use a donut-shaped pillow or sit on the toilet for long periods. This increases blood pooling and pain.  SEEK MEDICAL CARE IF:  You have increasing pain and swelling that is not controlled by treatment or medicine.  You have uncontrolled bleeding.  You have difficulty or you are unable to have a bowel movement.  You have pain or inflammation outside the area of the hemorrhoids. MAKE SURE YOU:  Understand these instructions.  Will watch your condition.  Will get help right away if you are not doing well or get worse.   This information is not intended to replace advice given to you by your health care provider. Make sure you discuss any questions you have with your health care provider.   Document Released: 06/28/2000 Document Revised: 06/17/2012 Document Reviewed: 05/05/2012 Elsevier Interactive Patient Education [employer].

## 2016-01-15 NOTE — Op Note (Signed)
[facility] Patient Name: Casey [name] Procedure Date: 01/15/2016 8:35 AM MRN: [id number] Date of Birth: 05/02/1946 Attending MD: Norvel Richards , MD CSN: [id number] Age: 70 Admit Type: Outpatient Procedure:                Colonoscopy with multiple snare polypectomies Indications:              Hematochezia Providers:                Norvel Richards, MD, Otis Peak B. [name] Seller, RN,                            Randa Spike, Technician Referring MD:              Medicines:                Propofol per Anesthesia Complications:            No immediate complications. Estimated Blood Loss:     Estimated blood loss was minimal. Procedure:                Pre-Anesthesia Assessment:                           - Prior to the procedure, a History and Physical                            was performed, and patient medications and                            allergies were reviewed. The patient's tolerance of                            previous anesthesia was also reviewed. The risks                            and benefits of the procedure and the sedation                            options and risks were discussed with the patient.                            All questions were answered, and informed consent                            was obtained. Prior Anticoagulants: The patient                            last took Coumadin (warfarin) 4 days prior to the                            procedure. ASA Grade Assessment: II - A patient                            with mild systemic disease. After reviewing the  risks and benefits, the patient was deemed in                            satisfactory condition to undergo the procedure.                           After obtaining informed consent, the colonoscope                            was passed under direct vision. Throughout the                            procedure, the patient's blood pressure, pulse, and              oxygen saturations were monitored continuously. The                            [id number]) was introduced through the anus                            and advanced to the the cecum, identified by                            appendiceal orifice and ileocecal valve. The                            colonoscopy was performed without difficulty. The                            patient tolerated the procedure well. The quality                            of the bowel preparation was adequate. The                            ileocecal valve, appendiceal orifice, and rectum                            were photographed. The patient tolerated the                            procedure well. Scope In: 8:42:46 AM Scope Out: 9:02:28 AM Scope Withdrawal Time: 0 hours 10 minutes 31 seconds  Total Procedure Duration: 0 hours 19 minutes 42 seconds  Findings:      The perianal and digital rectal examinations were normal.      Multiple small-mouthed diverticula were found in the entire colon.      Four pedunculated polyps were found in the colon and rectum.. The polyps       were 4 to 6 mm in size. 2 at the hepatic flexure; one at the splenic       flexure; one in the rectum at 10 cm from anal verge.These polyps were       removed with a cold snare. Resection and retrieval were complete.       Estimated blood loss was minimal.  Internal hemorrhoids were found during retroflexion. The hemorrhoids       were Grade I (internal hemorrhoids that do not prolapse). Impression:               - Diverticulosis in the entire examined colon.                           - Multiple rectal and colonic polyps. Resected and                            retrieved.                           - Internal hemorrhoids. Moderate Sedation:      Moderate (conscious) sedation was personally administered by an       anesthesia professional. The following parameters were monitored: oxygen       saturation, heart rate, blood  pressure, respiratory rate, EKG, adequacy       of pulmonary ventilation, and response to care. Total physician       intraservice time was 24 minutes. Recommendation:           - Patient has a contact number available for                            emergencies. The signs and symptoms of potential                            delayed complications were discussed with the                            patient. Return to normal activities tomorrow.                            Written discharge instructions were provided to the                            patient.                           - Resume previous diet.                           - Continue present medications.                           - Repeat colonoscopy date to be determined after                            pending pathology results are reviewed for                            surveillance based on pathology results. Begin                            Benefiber 1 tablespoon twice daily.                           -  Return to GI office after studies are complete. Procedure Code(s):        --- Professional ---                           [phone], Colonoscopy, flexible; with removal of                            tumor(s), polyp(s), or other lesion(s) by snare                            technique Diagnosis Code(s):        --- Professional ---                           K64.0, First degree hemorrhoids                           K62.1, Rectal polyp                           D12.3, Benign neoplasm of transverse colon (hepatic                            flexure or splenic flexure)                           K92.1, Melena (includes Hematochezia)                           K57.30, Diverticulosis of large intestine without                            perforation or abscess without bleeding CPT copyright 2016 American [employer]. All rights reserved. The codes documented in this report are preliminary and upon coder review may  be revised to meet  current compliance requirements. Cristopher Estimable. Semira Stoltzfus, MD Norvel Richards, MD 01/15/2016 9:18:08 AM This report has been signed electronically. Number of Addenda: 0

## 2016-01-15 NOTE — H&P (View-Only) (Signed)
Referring Provider: Sharilyn Sites, MD Primary Care Physician:  Purvis Kilts, MD Primary GI:  Dr. Gala Romney  Chief Complaint  Patient presents with  . Colonoscopy    HPI:   Casey [name] is a 70 y.o. male who presents to schedule colonoscopy. Last seen in our office for 20 11/02/2015. At that time noted recurrent and worsening hematochezia over the previous one to 2 months, was given Proctofoam by primary care but was not filled due to expense. He was overdue for colonoscopy at that time and was set up for the colonoscopy on propofol/MAC. Sent request primary care with guidelines for Coumadin management. Procedure had to be canceled and rescheduled due to the last time. Noted okay to hold Coumadin for 4 days prior, no needed Lovenox bridge.  Today he states his bleeding has stopped. Still with fluctuance and occasional anal leakage associated with it. Denies any new abdominal pain, N/V, fever, chills. No other upper or lower GI symptoms.   Past Medical History  Diagnosis Date  . Atrial fibrillation ([place]) 01/12/2010    2D Echo EF=>55%  . Depression   . Hyperlipidemia   . Hypertension   . Bipolar disorder ([place])   . Morbid obesity ([place])   . Hypothyroidism   . Prostatitis   . OSA (obstructive sleep apnea) 10/24/2009    AHI was 27.62hr RDI was 34.3 hr REM 0.00hr   . History of colonic polyps     Past Surgical History  Procedure Laterality Date  . Colonoscopy  2011    RMR: left-sided diverticula, minimal rectal friability. No polyps. Repeat 5 years.  . Tonsillectomy  1955  . Appendectomy  1959  . Cataract extraction  1995    Current Outpatient Prescriptions  Medication Sig Dispense Refill  . Albuterol Sulfate 108 (90 Base) MCG/ACT AEPB Inhale into the lungs as needed.    . ALPRAZolam (XANAX) 0.5 MG tablet Take 0.5 mg by mouth at bedtime as needed for anxiety.    . cholecalciferol (VITAMIN D) 1000 UNITS tablet Take 2,000 Units by mouth daily.    . citalopram (CELEXA) 20  MG tablet Take 1 tablet by mouth daily.    . cyclobenzaprine (FLEXERIL) 10 MG tablet     . guaiFENesin-codeine 100-10 MG/5ML syrup     . hydrochlorothiazide (HYDRODIURIL) 25 MG tablet     . HYDROcodone-acetaminophen (VICODIN) 5-500 MG tablet Take by mouth.    . lamoTRIgine (LAMICTAL) 200 MG tablet Take 1 tablet by mouth 2 (two) times daily.     Marland Kitchen levothyroxine (SYNTHROID, LEVOTHROID) 25 MCG tablet Take 1 tablet by mouth daily.    . mupirocin ointment (BACTROBAN) 2 %     . NON FORMULARY Cream mixture from [employer]    . oxcarbazepine (TRILEPTAL) 600 MG tablet Take 600 mg by mouth 2 (two) times daily.     Marland Kitchen triamcinolone cream (KENALOG) 0.1 %     . warfarin (COUMADIN) 5 MG tablet Take 10 mg by mouth daily. AS PER INR     No current facility-administered medications for this visit.    Allergies as of 12/28/2015 - Review Complete 12/28/2015  Allergen Reaction Noted  . Amlodipine  11/02/2012    Family History  Problem Relation Age of Onset  . Diabetes Mother   . Cancer Paternal Grandmother   . Heart failure Mother   . Hypertension Mother   . Hypertension Father   . Colon cancer Neg Hx     Social History   Social History  .  Marital Status: Married    Spouse Name: N/A  . Number of Children: N/A  . Years of Education: N/A   Social History Main Topics  . Smoking status: Never Smoker   . Smokeless tobacco: Never Used  . Alcohol Use: No  . Drug Use: No  . Sexual Activity: Not Asked   Other Topics Concern  . None   Social History Narrative    Review of Systems: General: Negative for anorexia, weight loss, fever, chills, fatigue, weakness.  ENT: Negative for hoarseness, difficulty swallowing. CV: Negative for chest pain, angina, palpitations,  peripheral edema.  Respiratory: Negative for dyspnea at rest, cough, sputum, wheezing.  GI: See history of present illness. MS: Chronic back pain.  Endo: Negative for unusual weight change.  Heme: Negative for bruising  or bleeding.   Physical Exam: BP 153/86 mmHg  Pulse 92  Temp(Src) 97.2 F (36.2 C) (Oral)  Ht 6\' 1"  (1.854 m)  Wt 235 lb (106.595 kg)  BMI 31.01 kg/m2 General: Alert and oriented. Pleasant and cooperative. Well-nourished and well-developed.  Head: Normocephalic and atraumatic. Eyes: Without icterus, sclera clear and conjunctiva pink.  Ears: Normal auditory acuity. Cardiovascular: Irregular rhythm, without murmurs appreciated. Extremities without clubbing or edema. Respiratory: Clear to auscultation bilaterally. No wheezes, rales, or rhonchi. No distress.  Gastrointestinal: +BS, rounded but soft, non-tender and non-distended. No HSM noted. No guarding or rebound. No masses appreciated.  Rectal: Deferred  Musculoskalatal: Hobbling posture due to chronic back pain. Skin: Intact without significant lesions or rashes. Neurologic: Alert and oriented x4; grossly normal neurologically. Psych: Alert and cooperative. Normal mood and affect. Heme/Lymph/Immune: No excessive bruising noted.   12/28/2015 9:25 AM   Disclaimer: This note was dictated with voice recognition software. Similar sounding words can inadvertently be transcribed and may not be corrected upon review.

## 2016-01-15 NOTE — Anesthesia Preprocedure Evaluation (Signed)
Anesthesia Evaluation  Patient identified by MRN, date of birth, ID band Patient awake    Reviewed: Allergy & Precautions, NPO status , Patient's Chart, lab work & pertinent test results  Airway Mallampati: III  TM Distance: <3 FB Neck ROM: Full    Dental  (+) Teeth Intact   Pulmonary sleep apnea , COPD,    breath sounds clear to auscultation       Cardiovascular hypertension, + dysrhythmias Atrial Fibrillation  Rhythm:Irregular Rate:Normal     Neuro/Psych PSYCHIATRIC DISORDERS Depression Bipolar Disorder    GI/Hepatic   Endo/Other  Hypothyroidism   Renal/GU      Musculoskeletal   Abdominal   Peds  Hematology   Anesthesia Other Findings   Reproductive/Obstetrics                             Anesthesia Physical Anesthesia Plan  ASA: III  Anesthesia Plan: MAC   Post-op Pain Management:    Induction: Intravenous  Airway Management Planned: Simple Face Mask  Additional Equipment:   Intra-op Plan:   Post-operative Plan:   Informed Consent: I have reviewed the patients History and Physical, chart, labs and discussed the procedure including the risks, benefits and alternatives for the proposed anesthesia with the patient or authorized representative who has indicated his/her understanding and acceptance.     Plan Discussed with:   Anesthesia Plan Comments:         Anesthesia Quick Evaluation

## 2016-01-15 NOTE — Transfer of Care (Signed)
Immediate Anesthesia Transfer of Care Note  Patient: Casey [name]  Procedure(s) Performed: Procedure(s) with comments: COLONOSCOPY WITH PROPOFOL (N/A) - 1300 - moved to 11:15 - office calling pt with 9:45 arrival time POLYPECTOMY  Patient Location: PACU  Anesthesia Type:MAC  Level of Consciousness: sedated  Airway & Oxygen Therapy: Patient Spontanous Breathing and Patient connected to face mask oxygen  Post-op Assessment: Report given to RN, Post -op Vital signs reviewed and stable and Patient moving all extremities  Post vital signs: Reviewed and stable  Last Vitals:  Filed Vitals:   01/15/16 0825 01/15/16 0830  BP: 142/76 146/87  Pulse:    Temp:    Resp: 18 18    Last Pain: There were no vitals filed for this visit.    Patients Stated Pain Goal: 1 ([id number])  Complications: No apparent anesthesia complications

## 2016-01-17 ENCOUNTER — Encounter (HOSPITAL_COMMUNITY): Payer: Self-pay | Admitting: Internal Medicine

## 2016-01-17 ENCOUNTER — Encounter: Payer: Self-pay | Admitting: Internal Medicine

## 2016-01-18 DIAGNOSIS — F319 Bipolar disorder, unspecified: Secondary | ICD-10-CM | POA: Diagnosis not present

## 2016-01-29 DIAGNOSIS — Z7901 Long term (current) use of anticoagulants: Secondary | ICD-10-CM | POA: Diagnosis not present

## 2016-02-01 DIAGNOSIS — M5410 Radiculopathy, site unspecified: Secondary | ICD-10-CM | POA: Diagnosis not present

## 2016-02-01 DIAGNOSIS — Z6833 Body mass index (BMI) 33.0-33.9, adult: Secondary | ICD-10-CM | POA: Diagnosis not present

## 2016-02-01 DIAGNOSIS — M5136 Other intervertebral disc degeneration, lumbar region: Secondary | ICD-10-CM | POA: Diagnosis not present

## 2016-02-08 DIAGNOSIS — F319 Bipolar disorder, unspecified: Secondary | ICD-10-CM | POA: Diagnosis not present

## 2016-02-22 DIAGNOSIS — F319 Bipolar disorder, unspecified: Secondary | ICD-10-CM | POA: Diagnosis not present

## 2016-02-28 DIAGNOSIS — Z7901 Long term (current) use of anticoagulants: Secondary | ICD-10-CM | POA: Diagnosis not present

## 2016-02-29 ENCOUNTER — Encounter: Payer: Self-pay | Admitting: Internal Medicine

## 2016-03-05 ENCOUNTER — Encounter: Payer: Self-pay | Admitting: Internal Medicine

## 2016-03-05 ENCOUNTER — Ambulatory Visit (INDEPENDENT_AMBULATORY_CARE_PROVIDER_SITE_OTHER): Payer: Medicare Other | Admitting: Internal Medicine

## 2016-03-05 VITALS — BP 146/78 | HR 81 | Temp 98.2°F | Ht 73.0 in | Wt 236.4 lb

## 2016-03-05 DIAGNOSIS — K5909 Other constipation: Secondary | ICD-10-CM

## 2016-03-05 DIAGNOSIS — K219 Gastro-esophageal reflux disease without esophagitis: Secondary | ICD-10-CM | POA: Diagnosis not present

## 2016-03-05 DIAGNOSIS — R1310 Dysphagia, unspecified: Secondary | ICD-10-CM | POA: Diagnosis not present

## 2016-03-05 DIAGNOSIS — K921 Melena: Secondary | ICD-10-CM | POA: Diagnosis not present

## 2016-03-05 MED ORDER — PANTOPRAZOLE SODIUM 40 MG PO TBEC: 40.0000 mg | DELAYED_RELEASE_TABLET | Freq: Every day | ORAL | 11 refills | Status: DC

## 2016-03-05 NOTE — Progress Notes (Signed)
Patient ID: Casey [name], male   DOB: July 18, 1945, 70 y.o.   MRN: [id number]      Primary Care Physician:  Purvis Kilts, MD Primary Gastroenterologist:  Dr. Gala Romney  Pre-Procedure History & Physical: HPI:  Casey [name] is a 70 y.o. male here for follow-up of rectal bleeding. Recent colonoscopy demonstrated multiple colonic polyps;  hyperplastic and adenomatous -  removed; internal hemorrhoids; he continues on Coumadin. He states rectal bleeding has improved; however,  he has  a chronic problem with increased flatulence when he passes gas he notes a small amount of blood.  Overall, improved. Slated for five-year follow-up colonoscopy  Relates indigestion from time to time feels sometimes he has to push food down with consumption of liquids. He says large part of the problem with cornbread which he really likes to eat. He says a particle sometimes get into his lungs. He denies typical heartburn symptoms. On no acid suppression therapy. No upper GI tract evaluation previously.  Intermittently constipated for which he takes stool softeners with good improvement in symptoms per does not take her stools regularly.  Past Medical History:  Diagnosis Date  . Arthritis   . Atrial fibrillation ([name]) 01/12/2010   2D Echo EF=>55%  . Bipolar disorder ([place])   . Chronic back pain   . COPD (chronic obstructive pulmonary disease) ([place])   . Depression   . Dysrhythmia    AFib  . History of colonic polyps   . History of gout   . Hyperlipidemia   . Hypertension   . Hypothyroidism   . Morbid obesity ([place])   . OSA (obstructive sleep apnea) 10/24/2009   AHI was 27.62hr RDI was 34.3 hr REM 0.00hr; had CPAP years ago but no longer uses.  . Prostatitis   . Tubular adenoma of colon 01/2016    Past Surgical History:  Procedure Laterality Date  . APPENDECTOMY  1959  . CATARACT EXTRACTION Bilateral 1995  . COLONOSCOPY  2011   RMR: left-sided diverticula, minimal rectal friability. No polyps.  Repeat 5 years.  . COLONOSCOPY WITH PROPOFOL N/A 01/15/2016   Dr.Naser Schuld- diverticulosis in the entire examined colon, multiple rectal and colonic polyps, internal hemorrhoids. bx= tubular adenomas and hyperplastic polyps.   Marland Kitchen HAND / FINGER LESION EXCISION Right   . POLYPECTOMY  01/15/2016   Procedure: POLYPECTOMY;  Surgeon: Daneil Dolin, MD;  Location[facility];  Service: Endoscopy;;  . [place]    Prior to Admission medications   Medication Sig Start Date End Date Taking? Authorizing Provider  Albuterol Sulfate 108 (90 Base) MCG/ACT AEPB Inhale into the lungs as needed.   Yes Historical Provider, MD  ALPRAZolam Duanne Moron) 0.5 MG tablet Take 0.5 mg by mouth at bedtime as needed for anxiety.   Yes Historical Provider, MD  cholecalciferol (VITAMIN D) 1000 UNITS tablet Take 2,000 Units by mouth daily.   Yes Historical Provider, MD  citalopram (CELEXA) 20 MG tablet Take 1 tablet by mouth daily. 11/24/13  Yes Historical Provider, MD  ibuprofen (ADVIL,MOTRIN) 200 MG tablet Take 200 mg by mouth every 6 (six) hours as needed.   Yes Historical Provider, MD  lamoTRIgine (LAMICTAL) 200 MG tablet Take 1 tablet by mouth at bedtime. Taking 150 mg at bedtime 11/24/13  Yes Historical Provider, MD  levothyroxine (SYNTHROID, LEVOTHROID) 25 MCG tablet Take 1 tablet by mouth daily. 04/06/15  Yes Historical Provider, MD  mupirocin ointment (BACTROBAN) 2 % Apply 1 application topically daily.  12/12/15  Yes Historical Provider,  MD  NON FORMULARY Apply 1 application topically 3 (three) times daily. Cream mixture from [employer]   Yes Historical Provider, MD  oxcarbazepine (TRILEPTAL) 600 MG tablet Take 900 mg by mouth 2 (two) times daily. Takes 900mg  in the am and 1200 mg in the pm   Yes Historical Provider, MD  triamcinolone cream (KENALOG) 0.1 % Apply 1 application topically 2 (two) times daily.  12/12/15  Yes Historical Provider, MD  warfarin (COUMADIN) 5 MG tablet Take 10 mg by mouth daily. AS PER  INR   Yes Historical Provider, MD  hydrochlorothiazide (HYDRODIURIL) 25 MG tablet Take 25 mg by mouth daily.  10/16/15   Historical Provider, MD  HYDROcodone-acetaminophen (VICODIN) 5-500 MG tablet Take 1 tablet by mouth every 4 (four) hours as needed.     Historical Provider, MD  pantoprazole (PROTONIX) 40 MG tablet Take 1 tablet (40 mg total) by mouth daily. 03/05/16   Daneil Dolin, MD  polyethylene glycol-electrolytes (NULYTELY/GOLYTELY) 420 g solution Take 4,000 mLs by mouth once. Patient not taking: Reported on 03/05/2016 12/28/15   Carlis Stable, NP    Allergies as of 03/05/2016 - Review Complete 03/05/2016  Allergen Reaction Noted  . Amlodipine  11/02/2012  . Vicodin [hydrocodone-acetaminophen]  01/15/2016    Family History  Problem Relation Age of Onset  . Diabetes Mother   . Cancer Paternal Grandmother   . Heart failure Mother   . Hypertension Mother   . Hypertension Father   . Colon cancer Neg Hx     Social History   Social History  . Marital status: Married    Spouse name: N/A  . Number of children: N/A  . Years of education: N/A   Occupational History  . Not on file.   Social History Main Topics  . Smoking status: Never Smoker  . Smokeless tobacco: Never Used  . Alcohol use No  . Drug use: No  . Sexual activity: Yes    Birth control/ protection: None   Other Topics Concern  . Not on file   Social History Narrative  . No narrative on file    Review of Systems: See HPI, otherwise negative ROS  Physical Exam: BP (!) 146/78   Pulse 81   Temp 98.2 F (36.8 C) (Oral)   Ht 6\' 1"  (1.854 m)   Wt 236 lb 6.4 oz (107.2 kg)   BMI 31.19 kg/m  General:   Alert,   pleasant and cooperative in NAD Skin:  Intact without significant lesions or rashes. Neck:  Supple; no masses or thyromegaly. No significant cervical adenopathy. Lungs:  Clear throughout to auscultation.   No wheezes, crackles, or rhonchi. No acute distress. Heart:  Irregular rhythm; no murmurs,  clicks, rubs,  or gallops. Abdomen: Non-distended, normal bowel sounds.  Soft and nontender without appreciable mass or hepatosplenomegaly.  Pulses:  Normal pulses noted. Extremities:  Without clubbing or edema.  Impression:   71 year old gentleman with intermittent rectal bleeding with flatulence;  known internal hemorrhoids. Multiple polyps removed recently.  Perceives passage of blood with flatulence and not so much with bowel movement.  Patient describes indigestion and some vague esophageal dysphagia-on no acid suppression therapy. No upper GI tract evaluation.  Incidentally, he denied any upper GI tract symptoms with his office visits earlier this year.  Hemorrhoids likely source of rectal bleeding. Hemorrhoid banding would be problematic, requiring a 14 day. Off anticoagulation to safely perform.   Recommendations:  Take stool sofeners every day   Begin Protonix 40  mg daily (30) with 11 refills  Begin probiotic in the way of [employer] for gas / bloat daily  Office visit in 4-6 weeks - Patient will likely need further evaluation is upper GI tract via an upper endoscopy in the near future     Notice: This dictation was prepared with Dragon dictation along with smaller phrase technology. Any transcriptional errors that result from this process are unintentional and may not be corrected upon review.

## 2016-03-05 NOTE — Patient Instructions (Signed)
Take stool sofeners every day   Begin Protonix 40 mg daily (30) with 11 refills  Begin probiotic in the way of [employer] for gas / bloat daily  Office visit in 4-6 weeks - you may need to have an upper endoscopy in the near future

## 2016-03-26 ENCOUNTER — Ambulatory Visit: Payer: Medicare Other | Admitting: Internal Medicine

## 2016-03-26 DIAGNOSIS — I872 Venous insufficiency (chronic) (peripheral): Secondary | ICD-10-CM | POA: Diagnosis not present

## 2016-03-26 DIAGNOSIS — L718 Other rosacea: Secondary | ICD-10-CM | POA: Diagnosis not present

## 2016-03-26 DIAGNOSIS — H31002 Unspecified chorioretinal scars, left eye: Secondary | ICD-10-CM | POA: Diagnosis not present

## 2016-03-26 DIAGNOSIS — B078 Other viral warts: Secondary | ICD-10-CM | POA: Diagnosis not present

## 2016-03-26 DIAGNOSIS — X32XXXD Exposure to sunlight, subsequent encounter: Secondary | ICD-10-CM | POA: Diagnosis not present

## 2016-03-26 DIAGNOSIS — L570 Actinic keratosis: Secondary | ICD-10-CM | POA: Diagnosis not present

## 2016-03-26 DIAGNOSIS — H5213 Myopia, bilateral: Secondary | ICD-10-CM | POA: Diagnosis not present

## 2016-03-26 DIAGNOSIS — Z7901 Long term (current) use of anticoagulants: Secondary | ICD-10-CM | POA: Diagnosis not present

## 2016-03-26 DIAGNOSIS — H52223 Regular astigmatism, bilateral: Secondary | ICD-10-CM | POA: Diagnosis not present

## 2016-03-29 DIAGNOSIS — Z7901 Long term (current) use of anticoagulants: Secondary | ICD-10-CM | POA: Diagnosis not present

## 2016-04-02 ENCOUNTER — Ambulatory Visit: Payer: Medicare Other | Admitting: Internal Medicine

## 2016-04-02 DIAGNOSIS — F319 Bipolar disorder, unspecified: Secondary | ICD-10-CM | POA: Diagnosis not present

## 2016-04-09 ENCOUNTER — Ambulatory Visit (INDEPENDENT_AMBULATORY_CARE_PROVIDER_SITE_OTHER): Payer: Medicare Other | Admitting: Internal Medicine

## 2016-04-09 ENCOUNTER — Encounter: Payer: Self-pay | Admitting: Internal Medicine

## 2016-04-09 VITALS — BP 139/83 | HR 106 | Temp 97.2°F | Ht 73.0 in | Wt 234.8 lb

## 2016-04-09 DIAGNOSIS — K219 Gastro-esophageal reflux disease without esophagitis: Secondary | ICD-10-CM | POA: Diagnosis not present

## 2016-04-09 DIAGNOSIS — K921 Melena: Secondary | ICD-10-CM

## 2016-04-09 MED ORDER — MESALAMINE 1000 MG RE SUPP: 1000.0000 mg | Freq: Every day | RECTAL | 0 refills | Status: DC

## 2016-04-09 NOTE — Progress Notes (Signed)
Primary Care Physician:  Purvis Kilts, MD Primary Gastroenterologist:  Dr. Gala Romney  Pre-Procedure History & Physical: HPI:  Casey [name] is a 70 y.o. male here for follow-up of intermittent rectal bleeding and GERD- and vague dysphagia.   GERD and dysphagia have resolved on Protonix 40 mg daily. Intermittent rectal bleeding mainly when exerting himself  - working in the yard. Anticoagulation  ongoing. If he does not exert himself,  he really does very well as for his bleeding goes. Slated for five-year follow-up colonoscopy.  Past Medical History:  Diagnosis Date  . Arthritis   . Atrial fibrillation ([place]) 01/12/2010   2D Echo EF=>55%  . Bipolar disorder ([place])   . Chronic back pain   . COPD (chronic obstructive pulmonary disease) ([place])   . Depression   . Dysrhythmia    AFib  . History of colonic polyps   . History of gout   . Hyperlipidemia   . Hypertension   . Hypothyroidism   . Morbid obesity ([place])   . OSA (obstructive sleep apnea) 10/24/2009   AHI was 27.62hr RDI was 34.3 hr REM 0.00hr; had CPAP years ago but no longer uses.  . Prostatitis   . Tubular adenoma of colon 01/2016    Past Surgical History:  Procedure Laterality Date  . APPENDECTOMY  1959  . CATARACT EXTRACTION Bilateral 1995  . COLONOSCOPY  2011   RMR: left-sided diverticula, minimal rectal friability. No polyps. Repeat 5 years.  . COLONOSCOPY WITH PROPOFOL N/A 01/15/2016   Dr.Rital Cavey- diverticulosis in the entire examined colon, multiple rectal and colonic polyps, internal hemorrhoids. bx= tubular adenomas and hyperplastic polyps.   Marland Kitchen HAND / FINGER LESION EXCISION Right   . POLYPECTOMY  01/15/2016   Procedure: POLYPECTOMY;  Surgeon: Daneil Dolin, MD;  [facility];  Service: Endoscopy;;  . [place]    Prior to Admission medications   Medication Sig Start Date End Date Taking? Authorizing Provider  Albuterol Sulfate 108 (90 Base) MCG/ACT AEPB Inhale into the lungs as needed.    Yes Historical Provider, MD  ALPRAZolam Duanne Moron) 0.5 MG tablet Take 0.5 mg by mouth at bedtime as needed for anxiety.   Yes Historical Provider, MD  cholecalciferol (VITAMIN D) 1000 UNITS tablet Take 2,000 Units by mouth daily.   Yes Historical Provider, MD  citalopram (CELEXA) 20 MG tablet Take 1 tablet by mouth daily. 11/24/13  Yes Historical Provider, MD  hydrochlorothiazide (HYDRODIURIL) 25 MG tablet Take 25 mg by mouth daily.  10/16/15  Yes Historical Provider, MD  ibuprofen (ADVIL,MOTRIN) 200 MG tablet Take 200 mg by mouth every 6 (six) hours as needed.   Yes Historical Provider, MD  lamoTRIgine (LAMICTAL) 200 MG tablet Take 1 tablet by mouth at bedtime. Taking 150 mg at bedtime 11/24/13  Yes Historical Provider, MD  levothyroxine (SYNTHROID, LEVOTHROID) 25 MCG tablet Take 1 tablet by mouth daily. 04/06/15  Yes Historical Provider, MD  mupirocin ointment (BACTROBAN) 2 % Apply 1 application topically daily.  12/12/15  Yes Historical Provider, MD  NON FORMULARY Apply 1 application topically 3 (three) times daily. Cream mixture from [employer]   Yes Historical Provider, MD  oxcarbazepine (TRILEPTAL) 600 MG tablet Take 900 mg by mouth 2 (two) times daily. Takes 900mg  in the am and 1200 mg in the pm   Yes Historical Provider, MD  pantoprazole (PROTONIX) 40 MG tablet Take 1 tablet (40 mg total) by mouth daily. 03/05/16  Yes Daneil Dolin, MD  triamcinolone  cream (KENALOG) 0.1 % Apply 1 application topically 2 (two) times daily.  12/12/15  Yes Historical Provider, MD  warfarin (COUMADIN) 5 MG tablet Take 10 mg by mouth daily. AS PER INR   Yes Historical Provider, MD  HYDROcodone-acetaminophen (VICODIN) 5-500 MG tablet Take 1 tablet by mouth every 4 (four) hours as needed.     Historical Provider, MD  polyethylene glycol-electrolytes (NULYTELY/GOLYTELY) 420 g solution Take 4,000 mLs by mouth once. Patient not taking: Reported on 04/09/2016 12/28/15   Carlis Stable, NP    Allergies as of 04/09/2016 -  Review Complete 04/09/2016  Allergen Reaction Noted  . Amlodipine  11/02/2012  . Vicodin [hydrocodone-acetaminophen]  01/15/2016    Family History  Problem Relation Age of Onset  . Diabetes Mother   . Cancer Paternal Grandmother   . Heart failure Mother   . Hypertension Mother   . Hypertension Father   . Colon cancer Neg Hx     Social History   Social History  . Marital status: Married    Spouse name: N/A  . Number of children: N/A  . Years of education: N/A   Occupational History  . Not on file.   Social History Main Topics  . Smoking status: Never Smoker  . Smokeless tobacco: Never Used  . Alcohol use No  . Drug use: No  . Sexual activity: Yes    Birth control/ protection: None   Other Topics Concern  . Not on file   Social History Narrative  . No narrative on file    Review of Systems: See HPI, otherwise negative ROS  Physical Exam: BP 139/83   Pulse (!) 106   Temp 97.2 F (36.2 C) (Oral)   Ht 6\' 1"  (1.854 m)   Wt 234 lb 12.8 oz (106.5 kg)   BMI 30.98 kg/m  General:   Alert, , pleasant and cooperative in NAD Neck:  Supple; no masses or thyromegaly. No significant cervical adenopathy. Abdomen: Non-distended, normal bowel sounds.  Soft and nontender without appreciable mass or hepatosplenomegaly.    Impression:  70 year old gentleman - . Anticoagulated. Hemorrhoids act up essentially when he is exerting himself such as doing heavy yard work. Not a banding candidate secondary to ongoing anticoagulation. GERD symptoms much better on Protonix. Dysphagia symptoms have resolved.   Recommendations: Benefiber 1 teaspoon twice daily  Canasa (1 gram ) suppository per rectum at bedtime x 1 month  Avoid straining as much as possible   Continue Protonix 40 mg daily  Office visit in 6 weeks   History of GERD some vague dysphagia-now resolved once daily PPI therapy. No need for further evaluation at this time.    Notice: This dictation was prepared  with Dragon dictation along with smaller phrase technology. Any transcriptional errors that result from this process are unintentional and may not be corrected upon review.

## 2016-04-09 NOTE — Patient Instructions (Signed)
Benefiber 1 teaspoon twice daily  Canasa (1 gram ) suppository per rectum at bedtime x 1 month  Avoid straining as much as possible   Continue Protonix 40 mg daily  Office visit in 6 weeks

## 2016-04-11 ENCOUNTER — Telehealth: Payer: Self-pay

## 2016-04-11 NOTE — Telephone Encounter (Signed)
Pt is aware and will come by and pick up samples.

## 2016-04-11 NOTE — Telephone Encounter (Signed)
Received a fax from the pharmacy- pts copay for the canasa suppositories is $246.00. I have samples for him at the front desk. I tried to call the pt- [id number]

## 2016-04-16 DIAGNOSIS — Z7901 Long term (current) use of anticoagulants: Secondary | ICD-10-CM | POA: Diagnosis not present

## 2016-04-25 ENCOUNTER — Ambulatory Visit (HOSPITAL_COMMUNITY)
Admission: RE | Admit: 2016-04-25 | Discharge: 2016-04-25 | Disposition: A | Discharge status: Home / Self Care | Payer: Medicare Other | Source: Ambulatory Visit | Attending: Family Medicine | Admitting: Family Medicine

## 2016-04-25 ENCOUNTER — Other Ambulatory Visit (HOSPITAL_COMMUNITY): Payer: Self-pay | Admitting: Family Medicine

## 2016-04-25 DIAGNOSIS — I700 Atherosclerosis of aorta: Secondary | ICD-10-CM | POA: Insufficient documentation

## 2016-04-25 DIAGNOSIS — Z6833 Body mass index (BMI) 33.0-33.9, adult: Secondary | ICD-10-CM | POA: Diagnosis not present

## 2016-04-25 DIAGNOSIS — E6609 Other obesity due to excess calories: Secondary | ICD-10-CM | POA: Diagnosis not present

## 2016-04-25 DIAGNOSIS — R0781 Pleurodynia: Secondary | ICD-10-CM

## 2016-04-25 DIAGNOSIS — R079 Chest pain, unspecified: Secondary | ICD-10-CM | POA: Diagnosis not present

## 2016-04-25 DIAGNOSIS — S2341XA Sprain of ribs, initial encounter: Secondary | ICD-10-CM | POA: Diagnosis not present

## 2016-04-25 DIAGNOSIS — R918 Other nonspecific abnormal finding of lung field: Secondary | ICD-10-CM | POA: Diagnosis not present

## 2016-05-09 ENCOUNTER — Ambulatory Visit (INDEPENDENT_AMBULATORY_CARE_PROVIDER_SITE_OTHER): Payer: Medicare Other | Admitting: Cardiovascular Disease

## 2016-05-09 VITALS — BP 131/72 | HR 78 | Ht 73.0 in | Wt 236.0 lb

## 2016-05-09 DIAGNOSIS — I482 Chronic atrial fibrillation: Secondary | ICD-10-CM

## 2016-05-09 DIAGNOSIS — I10 Essential (primary) hypertension: Secondary | ICD-10-CM | POA: Diagnosis not present

## 2016-05-09 DIAGNOSIS — E785 Hyperlipidemia, unspecified: Secondary | ICD-10-CM

## 2016-05-09 DIAGNOSIS — F319 Bipolar disorder, unspecified: Secondary | ICD-10-CM

## 2016-05-09 DIAGNOSIS — E669 Obesity, unspecified: Secondary | ICD-10-CM

## 2016-05-09 DIAGNOSIS — I4821 Permanent atrial fibrillation: Secondary | ICD-10-CM

## 2016-05-09 MED ORDER — HYDROCHLOROTHIAZIDE 12.5 MG PO TABS
12.5000 mg | ORAL_TABLET | Freq: Every day | ORAL | 3 refills | Status: DC
Start: 2016-05-09 — End: 2018-05-25

## 2016-05-09 NOTE — Patient Instructions (Signed)
Dr Sallyanne Kuster has recommended making the following medication changes: 1. DECREASE HCT to 12.5 mg daily  Your physician recommends that you schedule a follow-up appointment in 1 year. You will receive a reminder letter in the mail two months in advance. If you don't receive a letter, please call our office to schedule the follow-up appointment.  If you need a refill on your cardiac medications before your next appointment, please call your pharmacy.     Vitamin K Foods and Warfarin Warfarin is a medicine that helps prevent harmful blood clots by causing blood to clot more slowly. It does this by decreasing the activity of vitamin K, which promotes normal blood clotting. For the dose of warfarin you have been prescribed to work well, you need to get about the same amount of vitamin K from your food from day to day. Suddenly getting a lot more vitamin K could cause your blood to clot too quickly. A sudden decrease in vitamin K intake could cause your blood to clot too slowly. These changes in vitamin K intake could lead to dangerous blood clotsor to bleeding. WHAT GENERAL GUIDELINES DO I NEED TO FOLLOW?  Keep your intake of vitamin K consistent from day to day. To do this, you must be aware of which foods contain moderate or high amounts of vitamin K. Listed below are some foods that are very high, high, or moderately high in vitamin K. If you eat these foods, make sure you eat a consistent amount of them from day to day.  Avoid major changes in your diet, or tell your health care provider before changing your diet.  If you take a multivitamin that contains vitamin K, be sure to take it every day.  If you drink green tea, drink the same amount each day. WHAT FOODS ARE VERY HIGH IN VITAMIN K?   Greens, such as Swiss chard and beet, collard, mustard, or turnip greens (fresh or frozen, cooked).  Kale (fresh or frozen, cooked).   Parsley (raw).  Spinach (cooked).  WHAT FOODS ARE HIGH IN  VITAMIN K?  Asparagus (frozen, cooked).  Broccoli.   Bok choy (cooked).   Brussels sprouts (fresh or frozen, cooked).  Cabbage (cooked).  Coleslaw. WHAT FOODS ARE MODERATELY HIGH IN VITAMIN K?  Blueberries.  Black-eyed peas.  Endive (raw).   Green leaf lettuce (raw).   Green scallions (raw).  Kale (raw).  Okra (frozen, cooked).  Plantains (fried).  Romaine lettuce (raw).   Sauerkraut (canned).   Spinach (raw).   This information is not intended to replace advice given to you by your health care provider. Make sure you discuss any questions you have with your health care provider.   Document Released: 04/28/2009 Document Revised: 07/22/2014 Document Reviewed: 05/05/2013 Elsevier Interactive Patient Education [employer].

## 2016-05-10 ENCOUNTER — Encounter: Payer: Self-pay | Admitting: Cardiovascular Disease

## 2016-05-10 DIAGNOSIS — E669 Obesity, unspecified: Secondary | ICD-10-CM | POA: Insufficient documentation

## 2016-05-10 NOTE — Progress Notes (Signed)
Cardiology Office Note    Date:  05/10/2016   ID:  Casey [name], DOB 28-Jul-1945, MRN [id number]  PCP:  Purvis Kilts, MD  Cardiologist:   Sanda Klein, MD   Chief Complaint  Patient presents with  . Follow-up    pt states when looking to the right he sees two of everything right now and when looking to the left things gets blurry. he also states he gets dizzy sometimes and have to sit down for a while     History of Present Illness:  Casey [name] is a 70 y.o. male with permanent atrial fibrillation in the absence of major structural cardiac abnormalities, but with hypertension, hyperlipidemia, obstructive sleep apnea, bipolar disorder and COPD returning for routine follow-up.  He has generally done quite well since his last appointment. Over the last year he has had a few episodes of dizziness and diplopia that only occur when he is standing up. He started taking citalopram about the same time although it is unclear whether there is a direct correlation. He is planning to see a neurologist to evaluate this. He has had a little bit of hemorrhoidal bleeding but no serious bleeding complications. He has a follow-up appointment with Dr. Gala Romney next month. Warfarin anticoagulation has been a little erratic, especially recently. This may be due to some inconsistencies in his dietary intake of vitamin K containing foods.  By his most recent echocardiogram in June 2015 has normal left ventricular size and function with an ejection fraction of 55-60% and biatrial dilatation. He does not have known coronary or peripheral arterial disease and a nuclear perfusion study in 2011 was normal.  He has not had diabetes or congestive heart failure.  His blood pressure problem seemed to resolve when he lost weight.  Past Medical History:  Diagnosis Date  . Arthritis   . Atrial fibrillation ([place]) 01/12/2010   2D Echo EF=>55%  . Bipolar disorder ([place])   . Chronic back pain   . COPD (chronic  obstructive pulmonary disease) ([place])   . Depression   . Dysrhythmia    AFib  . History of colonic polyps   . History of gout   . Hyperlipidemia   . Hypertension   . Hypothyroidism   . Morbid obesity ([place])   . OSA (obstructive sleep apnea) 10/24/2009   AHI was 27.62hr RDI was 34.3 hr REM 0.00hr; had CPAP years ago but no longer uses.  . Prostatitis   . Tubular adenoma of colon 01/2016    Past Surgical History:  Procedure Laterality Date  . APPENDECTOMY  1959  . CATARACT EXTRACTION Bilateral 1995  . COLONOSCOPY  2011   RMR: left-sided diverticula, minimal rectal friability. No polyps. Repeat 5 years.  . COLONOSCOPY WITH PROPOFOL N/A 01/15/2016   Dr.Rourk- diverticulosis in the entire examined colon, multiple rectal and colonic polyps, internal hemorrhoids. bx= tubular adenomas and hyperplastic polyps.   Marland Kitchen HAND / FINGER LESION EXCISION Right   . POLYPECTOMY  01/15/2016   Procedure: POLYPECTOMY;  Surgeon: Daneil Dolin, MD;  [facility]: [facility];  Service: Endoscopy;;  . TONSILLECTOMY  1955    Current Medications: Outpatient Medications Prior to Visit  Medication Sig Dispense Refill  . Albuterol Sulfate 108 (90 Base) MCG/ACT AEPB Inhale into the lungs as needed.    . ALPRAZolam (XANAX) 0.5 MG tablet Take 0.5 mg by mouth at bedtime as needed for anxiety.    . cholecalciferol (VITAMIN D) 1000 UNITS tablet Take 2,000 Units by  mouth daily.    . citalopram (CELEXA) 20 MG tablet Take 1 tablet by mouth daily.    Marland Kitchen HYDROcodone-acetaminophen (VICODIN) 5-500 MG tablet Take 1 tablet by mouth every 4 (four) hours as needed.     Marland Kitchen ibuprofen (ADVIL,MOTRIN) 200 MG tablet Take 200 mg by mouth every 6 (six) hours as needed.    . lamoTRIgine (LAMICTAL) 200 MG tablet Take 1 tablet by mouth at bedtime. Taking 150 mg at bedtime    . levothyroxine (SYNTHROID, LEVOTHROID) 25 MCG tablet Take 1 tablet by mouth daily.    . mesalamine (CANASA) 1000 MG suppository Place 1 suppository (1,000 mg total)  rectally at bedtime. 30 suppository 0  . mupirocin ointment (BACTROBAN) 2 % Apply 1 application topically daily.     . NON FORMULARY Apply 1 application topically 3 (three) times daily. Cream mixture from [employer]    . oxcarbazepine (TRILEPTAL) 600 MG tablet Take 900 mg by mouth 2 (two) times daily. Takes 900mg  in the am and 1200 mg in the pm    . pantoprazole (PROTONIX) 40 MG tablet Take 1 tablet (40 mg total) by mouth daily. 30 tablet 11  . polyethylene glycol-electrolytes (NULYTELY/GOLYTELY) 420 g solution Take 4,000 mLs by mouth once. 4000 mL 0  . triamcinolone cream (KENALOG) 0.1 % Apply 1 application topically 2 (two) times daily.     Marland Kitchen warfarin (COUMADIN) 5 MG tablet Take 10 mg by mouth daily. AS PER INR    . hydrochlorothiazide (HYDRODIURIL) 25 MG tablet Take 25 mg by mouth daily.      No facility-administered medications prior to visit.      Allergies:   Amlodipine and Vicodin [hydrocodone-acetaminophen]   Social History   Social History  . Marital status: Married    Spouse name: N/A  . Number of children: N/A  . Years of education: N/A   Social History Main Topics  . Smoking status: Never Smoker  . Smokeless tobacco: Never Used  . Alcohol use No  . Drug use: No  . Sexual activity: Yes    Birth control/ protection: None   Other Topics Concern  . Not on file   Social History Narrative  . No narrative on file     Family History:  The patient's family history includes Cancer in his paternal grandmother; Diabetes in his mother; Heart failure in his mother; Hypertension in his father and mother.   ROS:   Please see the history of present illness.    ROS All other systems reviewed and are negative.   PHYSICAL EXAM:   VS:  BP 131/72   Pulse 78   Ht 6\' 1"  (1.854 m)   Wt 236 lb (107 kg)   BMI 31.14 kg/m    GEN: Well nourished, well developed, in no acute distress  HEENT: normal  Neck: no JVD, carotid bruits, or masses Cardiac: Irregular , no murmurs,  rubs, or gallops,no edema  Respiratory:  clear to auscultation bilaterally, normal work of breathing GI: soft, nontender, nondistended, + BS MS: no deformity or atrophy  Skin: warm and dry, no rash Neuro:  Alert and Oriented x 3, Strength and sensation are intact Psych: euthymic mood, full affect  Wt Readings from Last 3 Encounters:  05/09/16 236 lb (107 kg)  04/09/16 234 lb 12.8 oz (106.5 kg)  03/05/16 236 lb 6.4 oz (107.2 kg)      Studies/Labs Reviewed:   EKG:  EKG is ordered today.  The ekg ordered today demonstrates Atrial fibrillation with  a rate of 78 bpm, QTC 462 ms  Recent Labs: 01/08/2016: BUN 17; Creatinine, Ser 0.96; Hemoglobin 13.8; Platelets 141; Potassium 3.8; Sodium 135     ASSESSMENT:    1. Permanent atrial fibrillation ([place])   2. Essential hypertension   3. Hyperlipidemia LDL goal <130   4. Mild obesity   5. Bipolar affective disorder, remission status unspecified ([place])      PLAN:  In order of problems listed above:  1. AFib: Asymptomatic, well rate controlled. Numerous attempts at cardioversion in the past with/without antiarrhythmics were unsuccessful at maintaining sinus rhythm. Continue rate control strategy. Reviewed the list of vitamin K containing foods with him to help assist with better warfarin anticoagulation. Also reminded him of the option for direct oral anticoagulants are much more predictable. Due to cost issues he prefers to continue warfarin. 2. HTN: Controlled. His symptoms of dizziness and diplopia sound like they could represent orthostatic hypotension. This may be due to the combination of hydrochlorothiazide and citalopram. The citalopram seems to be working very well for his mood and I would not discontinue it. We'll cut the dose of hydrochlorothiazide in half. I did encourage him to follow up with the recommendation for neurology evaluation, in case his symptoms are due to a true neurological disorder. 3. HLP: He does not have known  coronary vascular disease. At his request we discontinued his statins a few years ago. 4. Obesity: He has a serious weakness for sweets and candy. In the past his weights and his mood disorder have often varied hand-in-hand. He tends to lose tremendous amounts of weights when he has about of depression. When that happens, he usually does not require educations for blood pressure anymore. 5. Mood appears to be compensated.    Medication Adjustments/Labs and Tests Ordered: Current medicines are reviewed at length with the patient today.  Concerns regarding medicines are outlined above.  Medication changes, Labs and Tests ordered today are listed in the Patient Instructions below. Patient Instructions  Dr Sallyanne Kuster has recommended making the following medication changes: 1. DECREASE HCT to 12.5 mg daily  Your physician recommends that you schedule a follow-up appointment in 1 year. You will receive a reminder letter in the mail two months in advance. If you don't receive a letter, please call our office to schedule the follow-up appointment.  If you need a refill on your cardiac medications before your next appointment, please call your pharmacy.     Vitamin K Foods and Warfarin Warfarin is a medicine that helps prevent harmful blood clots by causing blood to clot more slowly. It does this by decreasing the activity of vitamin K, which promotes normal blood clotting. For the dose of warfarin you have been prescribed to work well, you need to get about the same amount of vitamin K from your food from day to day. Suddenly getting a lot more vitamin K could cause your blood to clot too quickly. A sudden decrease in vitamin K intake could cause your blood to clot too slowly. These changes in vitamin K intake could lead to dangerous blood clotsor to bleeding. WHAT GENERAL GUIDELINES DO I NEED TO FOLLOW?  Keep your intake of vitamin K consistent from day to day. To do this, you must be aware of which  foods contain moderate or high amounts of vitamin K. Listed below are some foods that are very high, high, or moderately high in vitamin K. If you eat these foods, make sure you eat a consistent amount of  them from day to day.  Avoid major changes in your diet, or tell your health care provider before changing your diet.  If you take a multivitamin that contains vitamin K, be sure to take it every day.  If you drink green tea, drink the same amount each day. WHAT FOODS ARE VERY HIGH IN VITAMIN K?   Greens, such as Swiss chard and beet, collard, mustard, or turnip greens (fresh or frozen, cooked).  Kale (fresh or frozen, cooked).   Parsley (raw).  Spinach (cooked).  WHAT FOODS ARE HIGH IN VITAMIN K?  Asparagus (frozen, cooked).  Broccoli.   Bok choy (cooked).   Brussels sprouts (fresh or frozen, cooked).  Cabbage (cooked).  Coleslaw. WHAT FOODS ARE MODERATELY HIGH IN VITAMIN K?  Blueberries.  Black-eyed peas.  Endive (raw).   Green leaf lettuce (raw).   Green scallions (raw).  Kale (raw).  Okra (frozen, cooked).  Plantains (fried).  Romaine lettuce (raw).   Sauerkraut (canned).   Spinach (raw).   This information is not intended to replace advice given to you by your health care provider. Make sure you discuss any questions you have with your health care provider.   Document Released: 04/28/2009 Document Revised: 07/22/2014 Document Reviewed: 05/05/2013 Elsevier Interactive Patient Education 2016 [place], Sanda Klein, MD  05/10/2016 10:54 AM    [facility] [address], [place], [place]  [address] Phone: [phone]; Fax: [phone]

## 2016-05-14 DIAGNOSIS — F319 Bipolar disorder, unspecified: Secondary | ICD-10-CM | POA: Diagnosis not present

## 2016-05-17 DIAGNOSIS — M48061 Spinal stenosis, lumbar region without neurogenic claudication: Secondary | ICD-10-CM | POA: Diagnosis not present

## 2016-05-17 DIAGNOSIS — M47816 Spondylosis without myelopathy or radiculopathy, lumbar region: Secondary | ICD-10-CM | POA: Diagnosis not present

## 2016-05-21 ENCOUNTER — Ambulatory Visit (INDEPENDENT_AMBULATORY_CARE_PROVIDER_SITE_OTHER): Payer: Medicare Other | Admitting: Internal Medicine

## 2016-05-21 ENCOUNTER — Encounter: Payer: Self-pay | Admitting: Internal Medicine

## 2016-05-21 VITALS — BP 159/87 | HR 85 | Temp 98.3°F | Ht 73.0 in | Wt 236.0 lb

## 2016-05-21 DIAGNOSIS — K640 First degree hemorrhoids: Secondary | ICD-10-CM

## 2016-05-21 DIAGNOSIS — K219 Gastro-esophageal reflux disease without esophagitis: Secondary | ICD-10-CM

## 2016-05-21 DIAGNOSIS — Z7901 Long term (current) use of anticoagulants: Secondary | ICD-10-CM | POA: Diagnosis not present

## 2016-05-21 DIAGNOSIS — K5909 Other constipation: Secondary | ICD-10-CM | POA: Diagnosis not present

## 2016-05-21 NOTE — Progress Notes (Signed)
Primary Care Physician:  Purvis Kilts, MD Primary Gastroenterologist:  Dr. Gala Romney  Pre-Procedure History & Physical: HPI:  Casey [name] is a 70 y.o. male here for follow-up of constipation. He continues to have bleeding episodes on Coumadin. He has known hemorrhoids with recent colonoscopy. Wants something definitive done. Still gets constipated frequently. Bowel regimen includes Benefiber. Not taking MiraLAX or any other laxative at this time. Not taking a stool softener. Note stools are hard. Previously on hydrocodone for back issues but none at this time. GERD well controlled on Protonix 40 mg daily.   Past Medical History:  Diagnosis Date  . Arthritis   . Atrial fibrillation ([place]) 01/12/2010   2D Echo EF=>55%  . Bipolar disorder ([place])   . Chronic back pain   . COPD (chronic obstructive pulmonary disease) ([place])   . Depression   . Dysrhythmia    AFib  . History of colonic polyps   . History of gout   . Hyperlipidemia   . Hypertension   . Hypothyroidism   . Morbid obesity ([place])   . OSA (obstructive sleep apnea) 10/24/2009   AHI was 27.62hr RDI was 34.3 hr REM 0.00hr; had CPAP years ago but no longer uses.  . Prostatitis   . Tubular adenoma of colon 01/2016    Past Surgical History:  Procedure Laterality Date  . APPENDECTOMY  1959  . CATARACT EXTRACTION Bilateral 1995  . COLONOSCOPY  2011   RMR: left-sided diverticula, minimal rectal friability. No polyps. Repeat 5 years.  . COLONOSCOPY WITH PROPOFOL N/A 01/15/2016   Dr.Keyetta Hollingworth- diverticulosis in the entire examined colon, multiple rectal and colonic polyps, internal hemorrhoids. bx= tubular adenomas and hyperplastic polyps.   Marland Kitchen HAND / FINGER LESION EXCISION Right   . POLYPECTOMY  01/15/2016   Procedure: POLYPECTOMY;  Surgeon: Daneil Dolin, MD;  Location: [facility];  Service: Endoscopy;;  . [place]    Prior to Admission medications   Medication Sig Start Date End Date Taking? Authorizing  Provider  Albuterol Sulfate 108 (90 Base) MCG/ACT AEPB Inhale into the lungs as needed.   Yes Historical Provider, MD  ALPRAZolam Duanne Moron) 0.5 MG tablet Take 0.5 mg by mouth at bedtime as needed for anxiety.   Yes Historical Provider, MD  cholecalciferol (VITAMIN D) 1000 UNITS tablet Take 2,000 Units by mouth daily.   Yes Historical Provider, MD  citalopram (CELEXA) 20 MG tablet Take 1 tablet by mouth daily. 11/24/13  Yes Historical Provider, MD  hydrochlorothiazide (HYDRODIURIL) 12.5 MG tablet Take 1 tablet (12.5 mg total) by mouth daily. 05/09/16  Yes Mihai Croitoru, MD  HYDROcodone-acetaminophen (VICODIN) 5-500 MG tablet Take 1 tablet by mouth every 4 (four) hours as needed.    Yes Historical Provider, MD  ibuprofen (ADVIL,MOTRIN) 200 MG tablet Take 200 mg by mouth every 6 (six) hours as needed.   Yes Historical Provider, MD  Lactobacillus (DIGESTIVE HEALTH PROBIOTIC) CAPS Take 1 tablet by mouth daily.   Yes Historical Provider, MD  lamoTRIgine (LAMICTAL) 200 MG tablet Take 1 tablet by mouth at bedtime. Taking 150 mg at bedtime 11/24/13  Yes Historical Provider, MD  levothyroxine (SYNTHROID, LEVOTHROID) 25 MCG tablet Take 1 tablet by mouth daily. 04/06/15  Yes Historical Provider, MD  mesalamine (CANASA) 1000 MG suppository Place 1 suppository (1,000 mg total) rectally at bedtime. 04/09/16  Yes Daneil Dolin, MD  mupirocin ointment (BACTROBAN) 2 % Apply 1 application topically daily.  12/12/15  Yes Historical Provider, MD  naproxen sodium (  ALEVE) 220 MG tablet Take 220 mg by mouth as directed.   Yes Historical Provider, MD  NON FORMULARY Apply 1 application topically 3 (three) times daily. Cream mixture from [employer]   Yes Historical Provider, MD  oxcarbazepine (TRILEPTAL) 600 MG tablet Take 900 mg by mouth 2 (two) times daily. Takes 900mg  in the am and 1200 mg in the pm   Yes Historical Provider, MD  pantoprazole (PROTONIX) 40 MG tablet Take 1 tablet (40 mg total) by mouth daily. 03/05/16   Yes Daneil Dolin, MD  triamcinolone cream (KENALOG) 0.1 % Apply 1 application topically 2 (two) times daily.  12/12/15  Yes Historical Provider, MD  warfarin (COUMADIN) 5 MG tablet Take 10 mg by mouth daily. AS PER INR   Yes Historical Provider, MD  polyethylene glycol-electrolytes (NULYTELY/GOLYTELY) 420 g solution Take 4,000 mLs by mouth once. Patient not taking: Reported on 05/21/2016 12/28/15   Carlis Stable, NP    Allergies as of 05/21/2016 - Review Complete 05/21/2016  Allergen Reaction Noted  . Amlodipine  11/02/2012  . Vicodin [hydrocodone-acetaminophen]  01/15/2016    Family History  Problem Relation Age of Onset  . Diabetes Mother   . Heart failure Mother   . Hypertension Mother   . Hypertension Father   . Cancer Paternal Grandmother   . Colon cancer Neg Hx     Social History   Social History  . Marital status: Married    Spouse name: N/A  . Number of children: N/A  . Years of education: N/A   Occupational History  . Not on file.   Social History Main Topics  . Smoking status: Never Smoker  . Smokeless tobacco: Never Used  . Alcohol use No  . Drug use: No  . Sexual activity: Yes    Birth control/ protection: None   Other Topics Concern  . Not on file   Social History Narrative  . No narrative on file    Review of Systems: See HPI, otherwise negative ROS  Physical Exam: BP (!) 159/87   Pulse 85   Temp 98.3 F (36.8 C) (Oral)   Ht 6\' 1"  (1.854 m)   Wt 236 lb (107 kg)   BMI 31.14 kg/m  General:   Alert,  Well-developed, well-nourished, pleasant and cooperative in NAD   Impression:  GERD well-controlled on Protonix 40 mg daily  Hemorrhoids continue to bleed intermittently in the setting of anticoagulation and constipation. We have not optimally managed constipation as of yet. Patient wants definitive therapy for hemorrhoids. He is not a good banding candidate given the need to be off Coumadin and serial sessions needed. In this setting, he may be  best served by surgical hemorrhoidectomy to take care of things at one time. Although no guarantees can be made that all of his symptoms would resolve even then. For now, we need to optimize treatment of constipation.   Recommendations: Continue Benefiber - 1 tablespoon twice daily  Add Colace 100 mg twice daily  Use miralax 17 grams at bedtime on any day no good BM  Constipation information  As discussed, you may need a surgical hemorrhoidectomy  office visit in 6 weeks       Notice: This dictation was prepared with Dragon dictation along with smaller phrase technology. Any transcriptional errors that result from this process are unintentional and may not be corrected upon review.

## 2016-05-21 NOTE — Patient Instructions (Signed)
Continue Benefiber - 1 tablespoon twice daily  Add Colace 100 mg twice daily  Use miralax 17 grams at bedtime on any day no good BM  Constipation information  As discussed, you may need a surgical hemorrhoidectomy  office visit in 6 weeks

## 2016-05-23 DIAGNOSIS — M25552 Pain in left hip: Secondary | ICD-10-CM | POA: Diagnosis not present

## 2016-05-23 DIAGNOSIS — M4697 Unspecified inflammatory spondylopathy, lumbosacral region: Secondary | ICD-10-CM | POA: Diagnosis not present

## 2016-05-23 DIAGNOSIS — M48061 Spinal stenosis, lumbar region without neurogenic claudication: Secondary | ICD-10-CM | POA: Diagnosis not present

## 2016-05-23 DIAGNOSIS — M9943 Connective tissue stenosis of neural canal of lumbar region: Secondary | ICD-10-CM | POA: Diagnosis not present

## 2016-05-23 DIAGNOSIS — M47816 Spondylosis without myelopathy or radiculopathy, lumbar region: Secondary | ICD-10-CM | POA: Diagnosis not present

## 2016-05-23 DIAGNOSIS — G8929 Other chronic pain: Secondary | ICD-10-CM | POA: Diagnosis not present

## 2016-05-29 DIAGNOSIS — F319 Bipolar disorder, unspecified: Secondary | ICD-10-CM | POA: Diagnosis not present

## 2016-05-31 DIAGNOSIS — Z5181 Encounter for therapeutic drug level monitoring: Secondary | ICD-10-CM | POA: Diagnosis not present

## 2016-06-03 DIAGNOSIS — Z125 Encounter for screening for malignant neoplasm of prostate: Secondary | ICD-10-CM | POA: Diagnosis not present

## 2016-06-03 DIAGNOSIS — Z1389 Encounter for screening for other disorder: Secondary | ICD-10-CM | POA: Diagnosis not present

## 2016-06-03 DIAGNOSIS — Z139 Encounter for screening, unspecified: Secondary | ICD-10-CM | POA: Diagnosis not present

## 2016-06-03 DIAGNOSIS — Z23 Encounter for immunization: Secondary | ICD-10-CM | POA: Diagnosis not present

## 2016-06-03 DIAGNOSIS — Z6833 Body mass index (BMI) 33.0-33.9, adult: Secondary | ICD-10-CM | POA: Diagnosis not present

## 2016-06-03 DIAGNOSIS — E6609 Other obesity due to excess calories: Secondary | ICD-10-CM | POA: Diagnosis not present

## 2016-06-03 DIAGNOSIS — I4891 Unspecified atrial fibrillation: Secondary | ICD-10-CM | POA: Diagnosis not present

## 2016-06-11 DIAGNOSIS — F319 Bipolar disorder, unspecified: Secondary | ICD-10-CM | POA: Diagnosis not present

## 2016-06-21 DIAGNOSIS — M48061 Spinal stenosis, lumbar region without neurogenic claudication: Secondary | ICD-10-CM | POA: Diagnosis not present

## 2016-06-21 DIAGNOSIS — M47816 Spondylosis without myelopathy or radiculopathy, lumbar region: Secondary | ICD-10-CM | POA: Diagnosis not present

## 2016-06-25 DIAGNOSIS — J069 Acute upper respiratory infection, unspecified: Secondary | ICD-10-CM | POA: Diagnosis not present

## 2016-06-25 DIAGNOSIS — E6609 Other obesity due to excess calories: Secondary | ICD-10-CM | POA: Diagnosis not present

## 2016-06-25 DIAGNOSIS — Z1389 Encounter for screening for other disorder: Secondary | ICD-10-CM | POA: Diagnosis not present

## 2016-06-25 DIAGNOSIS — Z6833 Body mass index (BMI) 33.0-33.9, adult: Secondary | ICD-10-CM | POA: Diagnosis not present

## 2016-07-05 ENCOUNTER — Encounter: Payer: Self-pay | Admitting: Internal Medicine

## 2016-07-05 ENCOUNTER — Ambulatory Visit (INDEPENDENT_AMBULATORY_CARE_PROVIDER_SITE_OTHER): Payer: Medicare Other | Admitting: Internal Medicine

## 2016-07-05 VITALS — BP 155/72 | HR 73 | Temp 98.3°F | Ht 73.0 in | Wt 227.2 lb

## 2016-07-05 DIAGNOSIS — K5900 Constipation, unspecified: Secondary | ICD-10-CM

## 2016-07-05 DIAGNOSIS — K921 Melena: Secondary | ICD-10-CM | POA: Diagnosis not present

## 2016-07-05 DIAGNOSIS — K219 Gastro-esophageal reflux disease without esophagitis: Secondary | ICD-10-CM

## 2016-07-05 NOTE — Progress Notes (Signed)
Primary Care Physician:  Purvis Kilts, MD Primary Gastroenterologist:  Dr. Gala Romney  Pre-Procedure History & Physical: HPI:  Casey [name] is a 70 y.o. male here for follow-up of GERD rectal bleeding and constipation. With bowel regimen including Colace, MiraLAX and Benefiber-rectal bleeding has resolved. Has known internal hemorrhoids. Up-to-date on colonoscopy done earlier this year. Difficulties with the anticoagulation management with Coumadin. Patient was switched to Xarelto;. Bleeding has ceased.  Reflux symptoms well controlled on pantoprazole.  He now has no GI symptoms and is very happy.  Past Medical History:  Diagnosis Date  . Arthritis   . Atrial fibrillation ([place]) 01/12/2010   2D Echo EF=>55%  . Bipolar disorder ([place])   . Chronic back pain   . COPD (chronic obstructive pulmonary disease) ([place])   . Depression   . Dysrhythmia    AFib  . History of colonic polyps   . History of gout   . Hyperlipidemia   . Hypertension   . Hypothyroidism   . Morbid obesity ([place])   . OSA (obstructive sleep apnea) 10/24/2009   AHI was 27.62hr RDI was 34.3 hr REM 0.00hr; had CPAP years ago but no longer uses.  . Prostatitis   . Tubular adenoma of colon 01/2016    Past Surgical History:  Procedure Laterality Date  . APPENDECTOMY  1959  . CATARACT EXTRACTION Bilateral 1995  . COLONOSCOPY  2011   RMR: left-sided diverticula, minimal rectal friability. No polyps. Repeat 5 years.  . COLONOSCOPY WITH PROPOFOL N/A 01/15/2016   Dr.Jeramie Scogin- diverticulosis in the entire examined colon, multiple rectal and colonic polyps, internal hemorrhoids. bx= tubular adenomas and hyperplastic polyps.   Marland Kitchen HAND / FINGER LESION EXCISION Right   . POLYPECTOMY  01/15/2016   Procedure: POLYPECTOMY;  Surgeon: Daneil Dolin, MD;  [facility]: [facility];  Service: Endoscopy;;  . [place]    Prior to Admission medications   Medication Sig Start Date End Date Taking? Authorizing Provider    Albuterol Sulfate 108 (90 Base) MCG/ACT AEPB Inhale into the lungs as needed.   Yes Historical Provider, MD  ALPRAZolam Duanne Moron) 0.5 MG tablet Take 0.5 mg by mouth at bedtime as needed for anxiety.   Yes Historical Provider, MD  cholecalciferol (VITAMIN D) 1000 UNITS tablet Take 2,000 Units by mouth daily.   Yes Historical Provider, MD  citalopram (CELEXA) 20 MG tablet Take 1 tablet by mouth daily. 11/24/13  Yes Historical Provider, MD  hydrochlorothiazide (HYDRODIURIL) 12.5 MG tablet Take 1 tablet (12.5 mg total) by mouth daily. 05/09/16  Yes Mihai Croitoru, MD  HYDROcodone-acetaminophen (VICODIN) 5-500 MG tablet Take 1 tablet by mouth every 4 (four) hours as needed.    Yes Historical Provider, MD  ibuprofen (ADVIL,MOTRIN) 200 MG tablet Take 200 mg by mouth every 6 (six) hours as needed.   Yes Historical Provider, MD  Lactobacillus (DIGESTIVE HEALTH PROBIOTIC) CAPS Take 1 tablet by mouth daily.   Yes Historical Provider, MD  lamoTRIgine (LAMICTAL) 200 MG tablet Take 1 tablet by mouth at bedtime. Taking 150 mg at bedtime 11/24/13  Yes Historical Provider, MD  levothyroxine (SYNTHROID, LEVOTHROID) 25 MCG tablet Take 1 tablet by mouth daily. 04/06/15  Yes Historical Provider, MD  naproxen sodium (ALEVE) 220 MG tablet Take 220 mg by mouth as directed.   Yes Historical Provider, MD  NON FORMULARY Apply 1 application topically 3 (three) times daily. Cream mixture from [employer] Historical Provider, MD  oxcarbazepine (TRILEPTAL) 600 MG tablet Take  900 mg by mouth 2 (two) times daily. Takes 300mg  in the am and 1200 mg in the pm   Yes Historical Provider, MD  pantoprazole (PROTONIX) 40 MG tablet Take 1 tablet (40 mg total) by mouth daily. 03/05/16  Yes Daneil Dolin, MD  rivaroxaban (XARELTO) 20 MG TABS tablet Take 20 mg by mouth daily with supper.   Yes Historical Provider, MD  mesalamine (CANASA) 1000 MG suppository Place 1 suppository (1,000 mg total) rectally at bedtime. Patient not taking:  Reported on 07/05/2016 04/09/16   Daneil Dolin, MD  mupirocin ointment (BACTROBAN) 2 % Apply 1 application topically daily.  12/12/15   Historical Provider, MD  polyethylene glycol-electrolytes (NULYTELY/GOLYTELY) 420 g solution Take 4,000 mLs by mouth once. Patient not taking: Reported on 07/05/2016 12/28/15   Carlis Stable, NP  triamcinolone cream (KENALOG) 0.1 % Apply 1 application topically 2 (two) times daily.  12/12/15   Historical Provider, MD  warfarin (COUMADIN) 5 MG tablet Take 10 mg by mouth daily. AS PER INR    Historical Provider, MD    Allergies as of 07/05/2016 - Review Complete 07/05/2016  Allergen Reaction Noted  . Amlodipine  11/02/2012  . Vicodin [hydrocodone-acetaminophen]  01/15/2016    Family History  Problem Relation Age of Onset  . Diabetes Mother   . Heart failure Mother   . Hypertension Mother   . Hypertension Father   . Cancer Paternal Grandmother   . Colon cancer Neg Hx     Social History   Social History  . Marital status: Married    Spouse name: N/A  . Number of children: N/A  . Years of education: N/A   Occupational History  . Not on file.   Social History Main Topics  . Smoking status: Never Smoker  . Smokeless tobacco: Never Used  . Alcohol use No  . Drug use: No  . Sexual activity: Yes    Birth control/ protection: None   Other Topics Concern  . Not on file   Social History Narrative  . No narrative on file    Review of Systems: See HPI, otherwise negative ROS  Physical Exam: BP (!) 155/72   Pulse 73   Temp 98.3 F (36.8 C) (Oral)   Ht 6\' 1"  (1.854 m)   Wt 227 lb 3.2 oz (103.1 kg)   BMI 29.98 kg/m  General:   Alert,  Well-developed, well-nourished, pleasant and cooperative in NAD   Impression:  Pleasant 70 year old gentleman chronically anticoagulated with rectal bleeding now resolved likely hemorrhoidal in origin. Constipation also now well managed on the above-mentioned regimen.  GERD symptoms well controlled on  pantoprazole 40 mg once daily. History of colonic adenoma.   Recommendations: Continue Protonix daily  Continue Benefiber, Colace and Miralax as directed  Info on GERD and constipation  Office visit in 1year    Notice: This dictation was prepared with Dragon dictation along with smaller phrase technology. Any transcriptional errors that result from this process are unintentional and may not be corrected upon review.

## 2016-07-05 NOTE — Patient Instructions (Signed)
Continue Protonix daily  Continue Benefiber, Colace and Miralax as directed  Info on GERD and constipation  Office visit in 1year

## 2016-08-06 DIAGNOSIS — F319 Bipolar disorder, unspecified: Secondary | ICD-10-CM | POA: Diagnosis not present

## 2016-09-19 DIAGNOSIS — S30813A Abrasion of scrotum and testes, initial encounter: Secondary | ICD-10-CM | POA: Diagnosis not present

## 2016-09-24 ENCOUNTER — Other Ambulatory Visit (HOSPITAL_COMMUNITY): Payer: Self-pay | Admitting: Registered Nurse

## 2016-09-24 ENCOUNTER — Ambulatory Visit (HOSPITAL_COMMUNITY)
Admission: RE | Admit: 2016-09-24 | Discharge: 2016-09-24 | Disposition: A | Discharge status: Home / Self Care | Payer: Medicare Other | Source: Ambulatory Visit | Attending: Registered Nurse | Admitting: Registered Nurse

## 2016-09-24 DIAGNOSIS — M6281 Muscle weakness (generalized): Secondary | ICD-10-CM | POA: Insufficient documentation

## 2016-09-24 DIAGNOSIS — F3162 Bipolar disorder, current episode mixed, moderate: Secondary | ICD-10-CM | POA: Diagnosis not present

## 2016-09-24 DIAGNOSIS — M25511 Pain in right shoulder: Secondary | ICD-10-CM | POA: Diagnosis present

## 2016-09-24 DIAGNOSIS — X32XXXD Exposure to sunlight, subsequent encounter: Secondary | ICD-10-CM | POA: Diagnosis not present

## 2016-09-24 DIAGNOSIS — M19011 Primary osteoarthritis, right shoulder: Secondary | ICD-10-CM | POA: Insufficient documentation

## 2016-09-24 DIAGNOSIS — Z6833 Body mass index (BMI) 33.0-33.9, adult: Secondary | ICD-10-CM | POA: Diagnosis not present

## 2016-09-24 DIAGNOSIS — S4991XA Unspecified injury of right shoulder and upper arm, initial encounter: Secondary | ICD-10-CM | POA: Diagnosis not present

## 2016-09-24 DIAGNOSIS — E782 Mixed hyperlipidemia: Secondary | ICD-10-CM | POA: Diagnosis not present

## 2016-09-24 DIAGNOSIS — Z1283 Encounter for screening for malignant neoplasm of skin: Secondary | ICD-10-CM | POA: Diagnosis not present

## 2016-09-24 DIAGNOSIS — E669 Obesity, unspecified: Secondary | ICD-10-CM | POA: Diagnosis not present

## 2016-09-24 DIAGNOSIS — G43909 Migraine, unspecified, not intractable, without status migrainosus: Secondary | ICD-10-CM | POA: Diagnosis not present

## 2016-09-24 DIAGNOSIS — I10 Essential (primary) hypertension: Secondary | ICD-10-CM | POA: Diagnosis not present

## 2016-09-24 DIAGNOSIS — K219 Gastro-esophageal reflux disease without esophagitis: Secondary | ICD-10-CM | POA: Diagnosis not present

## 2016-09-24 DIAGNOSIS — L570 Actinic keratosis: Secondary | ICD-10-CM | POA: Diagnosis not present

## 2016-09-24 DIAGNOSIS — E6609 Other obesity due to excess calories: Secondary | ICD-10-CM | POA: Diagnosis not present

## 2016-09-24 DIAGNOSIS — D225 Melanocytic nevi of trunk: Secondary | ICD-10-CM | POA: Diagnosis not present

## 2016-10-02 DIAGNOSIS — M5030 Other cervical disc degeneration, unspecified cervical region: Secondary | ICD-10-CM | POA: Diagnosis not present

## 2016-10-02 DIAGNOSIS — M4802 Spinal stenosis, cervical region: Secondary | ICD-10-CM | POA: Diagnosis not present

## 2016-10-02 DIAGNOSIS — M542 Cervicalgia: Secondary | ICD-10-CM | POA: Diagnosis not present

## 2016-10-02 DIAGNOSIS — M129 Arthropathy, unspecified: Secondary | ICD-10-CM | POA: Diagnosis not present

## 2016-10-03 DIAGNOSIS — M19011 Primary osteoarthritis, right shoulder: Secondary | ICD-10-CM | POA: Diagnosis not present

## 2016-10-03 DIAGNOSIS — Z6833 Body mass index (BMI) 33.0-33.9, adult: Secondary | ICD-10-CM | POA: Diagnosis not present

## 2016-10-03 DIAGNOSIS — E6609 Other obesity due to excess calories: Secondary | ICD-10-CM | POA: Diagnosis not present

## 2016-10-09 DIAGNOSIS — M25511 Pain in right shoulder: Secondary | ICD-10-CM | POA: Diagnosis not present

## 2016-10-15 DIAGNOSIS — M25511 Pain in right shoulder: Secondary | ICD-10-CM | POA: Diagnosis not present

## 2016-10-15 DIAGNOSIS — M542 Cervicalgia: Secondary | ICD-10-CM | POA: Diagnosis not present

## 2016-10-16 DIAGNOSIS — E6609 Other obesity due to excess calories: Secondary | ICD-10-CM | POA: Diagnosis not present

## 2016-10-16 DIAGNOSIS — M1991 Primary osteoarthritis, unspecified site: Secondary | ICD-10-CM | POA: Diagnosis not present

## 2016-10-16 DIAGNOSIS — Z6833 Body mass index (BMI) 33.0-33.9, adult: Secondary | ICD-10-CM | POA: Diagnosis not present

## 2016-10-16 DIAGNOSIS — E039 Hypothyroidism, unspecified: Secondary | ICD-10-CM | POA: Diagnosis not present

## 2016-10-16 DIAGNOSIS — E782 Mixed hyperlipidemia: Secondary | ICD-10-CM | POA: Diagnosis not present

## 2016-10-16 DIAGNOSIS — I10 Essential (primary) hypertension: Secondary | ICD-10-CM | POA: Diagnosis not present

## 2016-10-21 DIAGNOSIS — M25511 Pain in right shoulder: Secondary | ICD-10-CM | POA: Diagnosis not present

## 2016-10-23 DIAGNOSIS — M25611 Stiffness of right shoulder, not elsewhere classified: Secondary | ICD-10-CM | POA: Diagnosis not present

## 2016-10-23 DIAGNOSIS — M25511 Pain in right shoulder: Secondary | ICD-10-CM | POA: Diagnosis not present

## 2016-10-23 DIAGNOSIS — M542 Cervicalgia: Secondary | ICD-10-CM | POA: Diagnosis not present

## 2016-10-23 DIAGNOSIS — M6281 Muscle weakness (generalized): Secondary | ICD-10-CM | POA: Diagnosis not present

## 2016-10-25 DIAGNOSIS — Z6833 Body mass index (BMI) 33.0-33.9, adult: Secondary | ICD-10-CM | POA: Diagnosis not present

## 2016-10-25 DIAGNOSIS — R7309 Other abnormal glucose: Secondary | ICD-10-CM | POA: Diagnosis not present

## 2016-10-25 DIAGNOSIS — F3162 Bipolar disorder, current episode mixed, moderate: Secondary | ICD-10-CM | POA: Diagnosis not present

## 2016-10-25 DIAGNOSIS — E782 Mixed hyperlipidemia: Secondary | ICD-10-CM | POA: Diagnosis not present

## 2016-10-25 DIAGNOSIS — E6609 Other obesity due to excess calories: Secondary | ICD-10-CM | POA: Diagnosis not present

## 2016-10-25 DIAGNOSIS — R51 Headache: Secondary | ICD-10-CM | POA: Diagnosis not present

## 2016-10-25 DIAGNOSIS — I4891 Unspecified atrial fibrillation: Secondary | ICD-10-CM | POA: Diagnosis not present

## 2016-10-25 DIAGNOSIS — N183 Chronic kidney disease, stage 3 (moderate): Secondary | ICD-10-CM | POA: Diagnosis not present

## 2016-10-29 DIAGNOSIS — M6281 Muscle weakness (generalized): Secondary | ICD-10-CM | POA: Diagnosis not present

## 2016-10-29 DIAGNOSIS — M25511 Pain in right shoulder: Secondary | ICD-10-CM | POA: Diagnosis not present

## 2016-10-29 DIAGNOSIS — F319 Bipolar disorder, unspecified: Secondary | ICD-10-CM | POA: Diagnosis not present

## 2016-10-29 DIAGNOSIS — M25611 Stiffness of right shoulder, not elsewhere classified: Secondary | ICD-10-CM | POA: Diagnosis not present

## 2016-10-29 DIAGNOSIS — M542 Cervicalgia: Secondary | ICD-10-CM | POA: Diagnosis not present

## 2016-10-31 DIAGNOSIS — M25611 Stiffness of right shoulder, not elsewhere classified: Secondary | ICD-10-CM | POA: Diagnosis not present

## 2016-10-31 DIAGNOSIS — M542 Cervicalgia: Secondary | ICD-10-CM | POA: Diagnosis not present

## 2016-10-31 DIAGNOSIS — M25511 Pain in right shoulder: Secondary | ICD-10-CM | POA: Diagnosis not present

## 2016-10-31 DIAGNOSIS — M47816 Spondylosis without myelopathy or radiculopathy, lumbar region: Secondary | ICD-10-CM | POA: Diagnosis not present

## 2016-10-31 DIAGNOSIS — M6281 Muscle weakness (generalized): Secondary | ICD-10-CM | POA: Diagnosis not present

## 2016-11-04 ENCOUNTER — Emergency Department (HOSPITAL_COMMUNITY): Payer: Medicare Other

## 2016-11-04 ENCOUNTER — Encounter (HOSPITAL_COMMUNITY): Payer: Self-pay | Admitting: *Deleted

## 2016-11-04 ENCOUNTER — Emergency Department (HOSPITAL_COMMUNITY)
Admission: EM | Admit: 2016-11-04 | Discharge: 2016-11-05 | Disposition: A | Discharge status: Home / Self Care | Payer: Medicare Other | Attending: Emergency Medicine | Admitting: Emergency Medicine

## 2016-11-04 DIAGNOSIS — Z7901 Long term (current) use of anticoagulants: Secondary | ICD-10-CM | POA: Diagnosis not present

## 2016-11-04 DIAGNOSIS — J449 Chronic obstructive pulmonary disease, unspecified: Secondary | ICD-10-CM | POA: Insufficient documentation

## 2016-11-04 DIAGNOSIS — I639 Cerebral infarction, unspecified: Secondary | ICD-10-CM | POA: Diagnosis not present

## 2016-11-04 DIAGNOSIS — F801 Expressive language disorder: Secondary | ICD-10-CM | POA: Diagnosis not present

## 2016-11-04 DIAGNOSIS — R51 Headache: Secondary | ICD-10-CM | POA: Diagnosis not present

## 2016-11-04 DIAGNOSIS — R479 Unspecified speech disturbances: Secondary | ICD-10-CM

## 2016-11-04 DIAGNOSIS — R519 Headache, unspecified: Secondary | ICD-10-CM

## 2016-11-04 DIAGNOSIS — G4489 Other headache syndrome: Secondary | ICD-10-CM | POA: Diagnosis not present

## 2016-11-04 DIAGNOSIS — Z79899 Other long term (current) drug therapy: Secondary | ICD-10-CM | POA: Insufficient documentation

## 2016-11-04 DIAGNOSIS — Z6833 Body mass index (BMI) 33.0-33.9, adult: Secondary | ICD-10-CM | POA: Diagnosis not present

## 2016-11-04 DIAGNOSIS — R4781 Slurred speech: Secondary | ICD-10-CM | POA: Diagnosis not present

## 2016-11-04 DIAGNOSIS — R29810 Facial weakness: Secondary | ICD-10-CM | POA: Diagnosis not present

## 2016-11-04 DIAGNOSIS — R269 Unspecified abnormalities of gait and mobility: Secondary | ICD-10-CM | POA: Diagnosis not present

## 2016-11-04 DIAGNOSIS — G8929 Other chronic pain: Secondary | ICD-10-CM | POA: Diagnosis not present

## 2016-11-04 DIAGNOSIS — I10 Essential (primary) hypertension: Secondary | ICD-10-CM | POA: Diagnosis not present

## 2016-11-04 DIAGNOSIS — E039 Hypothyroidism, unspecified: Secondary | ICD-10-CM | POA: Diagnosis not present

## 2016-11-04 DIAGNOSIS — I6789 Other cerebrovascular disease: Secondary | ICD-10-CM | POA: Diagnosis not present

## 2016-11-04 DIAGNOSIS — R531 Weakness: Secondary | ICD-10-CM | POA: Diagnosis not present

## 2016-11-04 DIAGNOSIS — R4789 Other speech disturbances: Secondary | ICD-10-CM | POA: Diagnosis not present

## 2016-11-04 DIAGNOSIS — E6609 Other obesity due to excess calories: Secondary | ICD-10-CM | POA: Diagnosis not present

## 2016-11-04 LAB — CBC WITH DIFFERENTIAL/PLATELET
BASOS ABS: 0 10*3/uL (ref 0.0–0.1)
Basophils Relative: 0 %
Eosinophils Absolute: 0.2 10*3/uL (ref 0.0–0.7)
Eosinophils Relative: 4 %
HEMATOCRIT: 42.7 % (ref 39.0–52.0)
HEMOGLOBIN: 14.6 g/dL (ref 13.0–17.0)
LYMPHS PCT: 21 %
Lymphs Abs: 1.2 10*3/uL (ref 0.7–4.0)
MCH: 31.8 pg (ref 26.0–34.0)
MCHC: 34.2 g/dL (ref 30.0–36.0)
MCV: 93 fL (ref 78.0–100.0)
MONO ABS: 0.4 10*3/uL (ref 0.1–1.0)
Monocytes Relative: 8 %
NEUTROS ABS: 3.7 10*3/uL (ref 1.7–7.7)
Neutrophils Relative %: 67 %
Platelets: 152 10*3/uL (ref 150–400)
RBC: 4.59 MIL/uL (ref 4.22–5.81)
RDW: 13.4 % (ref 11.5–15.5)
WBC: 5.6 10*3/uL (ref 4.0–10.5)

## 2016-11-04 LAB — PROTIME-INR
INR: 1.05
PROTHROMBIN TIME: 13.8 s (ref 11.4–15.2)

## 2016-11-04 LAB — COMPREHENSIVE METABOLIC PANEL
ALBUMIN: 4.2 g/dL (ref 3.5–5.0)
ALT: 22 U/L (ref 17–63)
ANION GAP: 9 (ref 5–15)
AST: 28 U/L (ref 15–41)
Alkaline Phosphatase: 88 U/L (ref 38–126)
BILIRUBIN TOTAL: 0.6 mg/dL (ref 0.3–1.2)
BUN: 16 mg/dL (ref 6–20)
CO2: 26 mmol/L (ref 22–32)
Calcium: 9.2 mg/dL (ref 8.9–10.3)
Chloride: 102 mmol/L (ref 101–111)
Creatinine, Ser: 1.21 mg/dL (ref 0.61–1.24)
GFR calc Af Amer: >60 mL/min (ref >60)
GFR calc non Af Amer: 59 mL/min — ABNORMAL LOW (ref >60)
GLUCOSE: 107 mg/dL — AB (ref 65–99)
POTASSIUM: 3.7 mmol/L (ref 3.5–5.1)
SODIUM: 137 mmol/L (ref 135–145)
TOTAL PROTEIN: 7 g/dL (ref 6.5–8.1)

## 2016-11-04 LAB — APTT: aPTT: 36 seconds (ref 24–36)

## 2016-11-04 LAB — TROPONIN I: Troponin I: <0.03 ng/mL (ref <0.03)

## 2016-11-04 LAB — ETHANOL: Alcohol, Ethyl (B): <5 mg/dL (ref <5)

## 2016-11-04 MED ORDER — HYDRALAZINE HCL 20 MG/ML IJ SOLN: 20.0000 mg | Freq: Once | INTRAMUSCULAR | Status: DC

## 2016-11-04 MED ORDER — DIPHENHYDRAMINE HCL 50 MG/ML IJ SOLN
25.0000 mg | Freq: Once | INTRAMUSCULAR | Status: AC
Start: 2016-11-04 — End: 2016-11-04
  Administered 2016-11-04: 25 mg via INTRAVENOUS
  Filled 2016-11-04: qty 1

## 2016-11-04 MED ORDER — SODIUM CHLORIDE 0.9 % IV BOLUS (SEPSIS)
500.0000 mL | Freq: Once | INTRAVENOUS | Status: AC
  Administered 2016-11-04: 500 mL via INTRAVENOUS

## 2016-11-04 MED ORDER — ACETAMINOPHEN 500 MG PO TABS
1000.0000 mg | ORAL_TABLET | Freq: Once | ORAL | Status: AC
  Administered 2016-11-05: 1000 mg via ORAL
  Filled 2016-11-04: qty 2

## 2016-11-04 MED ORDER — SODIUM CHLORIDE 0.9 % IV SOLN
100.0000 mL/h | INTRAVENOUS | Status: DC
Start: 2016-11-04 — End: 2016-11-05
  Administered 2016-11-04: 100 mL/h via INTRAVENOUS

## 2016-11-04 MED ORDER — METOCLOPRAMIDE HCL 5 MG/ML IJ SOLN
10.0000 mg | Freq: Once | INTRAMUSCULAR | Status: AC
  Administered 2016-11-04: 10 mg via INTRAVENOUS
  Filled 2016-11-04: qty 2

## 2016-11-04 NOTE — Consult Note (Signed)
NEURO HOSPITALIST CONSULT NOTE   Requestig physician: Dr. Johnney Killian  Reason for Consult: Abnormal speech  History obtained from:   Patient, Family and Chart     HPI:                                                                                                                                          Casey [name] is an 71 y.o. male who presented with a 3 day history of speech abnormality with delayed responses, as well as complaint of right sided weakness and right facial droop. The above symptoms are in the context of a 2 month history of severe headache with right sided neck pain. Regarding his speech deficit, it worsened today, manifesting with slow, laborious, dysarthric and halting quality. PCP felt that the symptoms were stroke related and he was sent to the ED for further evaluation.   He has a history of atrial fibrillation and is anticoagulated with Xarelto. He has no prior history of stroke.   His PMHx includes bipolar disorder, which is currently managed by a psychiatrist.   He denies any prior history of anxiety related neurological symptoms.    Regarding his pain, he states that he has had right shoulder imaging but has not had an MRI of his cervical spine.   In the ED, he also endorses new onset of double vision with rightward gaze.   Past Medical History:  Diagnosis Date  . Arthritis   . Atrial fibrillation ([place]) 01/12/2010   2D Echo EF=>55%  . Bipolar disorder ([place])   . Chronic back pain   . COPD (chronic obstructive pulmonary disease) ([place])   . Depression   . Dysrhythmia    AFib  . History of colonic polyps   . History of gout   . Hyperlipidemia   . Hypertension   . Hypothyroidism   . Morbid obesity ([place])   . OSA (obstructive sleep apnea) 10/24/2009   AHI was 27.62hr RDI was 34.3 hr REM 0.00hr; had CPAP years ago but no longer uses.  . Prostatitis   . Tubular adenoma of colon 01/2016    Past Surgical History:  Procedure Laterality  Date  . APPENDECTOMY  1959  . CATARACT EXTRACTION Bilateral 1995  . COLONOSCOPY  2011   RMR: left-sided diverticula, minimal rectal friability. No polyps. Repeat 5 years.  . COLONOSCOPY WITH PROPOFOL N/A 01/15/2016   Dr.Rourk- diverticulosis in the entire examined colon, multiple rectal and colonic polyps, internal hemorrhoids. bx= tubular adenomas and hyperplastic polyps.   Marland Kitchen HAND / FINGER LESION EXCISION Right   . POLYPECTOMY  01/15/2016   Procedure: POLYPECTOMY;  Surgeon: Daneil Dolin, MD;  [facility];  Service: Endoscopy;;  . TONSILLECTOMY  1955    Family History  Problem Relation  Age of Onset  . Diabetes Mother   . Heart failure Mother   . Hypertension Mother   . Hypertension Father   . Cancer Paternal Grandmother   . Colon cancer Neg Hx    Social History:  reports that he has never smoked. He has never used smokeless tobacco. He reports that he does not drink alcohol or use drugs.  Allergies  Allergen Reactions  . Amlodipine   . Vicodin [Hydrocodone-Acetaminophen]     Patient is unsure of reaction    MEDICATIONS:                                                                                                                     Albuterol Sulfate 108 (90 Base) MCG/ACT AEPB Inhale into the lungs as needed. Historical Provider, MD Needs Review  ALPRAZolam (XANAX) 0.5 MG tablet Take 0.5 mg by mouth at bedtime as needed for anxiety. Historical Provider, MD Needs Review  cholecalciferol (VITAMIN D) 1000 UNITS tablet Take 2,000 Units by mouth daily. Historical Provider, MD Needs Review  citalopram (CELEXA) 20 MG tablet Take 1 tablet by mouth daily. Historical Provider, MD Needs Review  hydrochlorothiazide (HYDRODIURIL) 12.5 MG tablet Take 1 tablet (12.5 mg total) by mouth daily. Sanda Klein, MD Needs Review  HYDROcodone-acetaminophen (VICODIN) 5-500 MG tablet Take 1 tablet by mouth every 4 (four) hours as needed.  Historical Provider, MD Needs Review  ibuprofen  (ADVIL,MOTRIN) 200 MG tablet Take 200 mg by mouth every 6 (six) hours as needed. Historical Provider, MD Needs Review  Lactobacillus (DIGESTIVE HEALTH PROBIOTIC) CAPS Take 1 tablet by mouth daily. Historical Provider, MD Needs Review  lamoTRIgine (LAMICTAL) 200 MG tablet Take 1 tablet by mouth at bedtime. Taking 150 mg at bedtime Historical Provider, MD Needs Review  levothyroxine (SYNTHROID, LEVOTHROID) 25 MCG tablet Take 1 tablet by mouth daily. Historical Provider, MD Needs Review  mesalamine (CANASA) 1000 MG suppository Place 1 suppository (1,000 mg total) rectally at bedtime. Daneil Dolin, MD Needs Review   Patient not taking: Reported on 07/05/2016    mupirocin ointment (BACTROBAN) 2 % Apply 1 application topically daily.  Historical Provider, MD Needs Review  naproxen sodium (ALEVE) 220 MG tablet Take 220 mg by mouth as directed. Historical Provider, MD Needs Review  NON FORMULARY Apply 1 application topically 3 (three) times daily. Cream mixture from [employer], MD Needs Review  oxcarbazepine (TRILEPTAL) 600 MG tablet Take 900 mg by mouth 2 (two) times daily. Takes 300mg  in the am and 1200 mg in the pm Historical Provider, MD Needs Review  pantoprazole (PROTONIX) 40 MG tablet Take 1 tablet (40 mg total) by mouth daily. Daneil Dolin, MD Needs Review  polyethylene glycol-electrolytes (NULYTELY/GOLYTELY) 420 g solution Take 4,000 mLs by mouth once. Carlis Stable, NP Needs Review   Patient not taking: Reported on 07/05/2016    rivaroxaban (XARELTO) 20 MG TABS tablet Take 20 mg by mouth daily with supper. Historical Provider, MD Needs Review  triamcinolone cream (KENALOG) 0.1 %  Apply 1 application topically 2 (two) times daily.  Historical Provider, MD Needs Review  warfarin (COUMADIN) 5 MG tablet Take 10 mg by mouth daily. AS PER INR Historical Provider, MD     ROS:                                                                                                                                        History obtained from patient. Also positive for right shoulder pain and LLE weakness. Other ROS as per HPI.   Blood pressure (!) 181/96, pulse 72, temperature 98 F (36.7 C), temperature source Oral, resp. rate 16, SpO2 92 %.   Neurologic Examination:                                                                                                      HEENT-  [place]/AT. Has a towel draped over his forehead.   Lungs- Respirations unlabored.  Extremities- Warm and well perfused.   Neurological Examination Mental Status: Alert and oriented. Able to answer all questions and follow all commands. Speech pattern in bizarre, with waxing and waning dysarthria, long drawn out vowels and consonants, labored presentation, frequent stuttering and long pauses but no pattern consistent with an expressive or receptive aphasia.  Cranial Nerves: II: Visual fields intact. PERRL.  III,IV, VI: Effortful squinting of eyes intermittently, most prominently when attended to by examiner. No ptosis noted. Horizontal versions intact to left and right without exo- or esotropia. Vertical eye movements are full. No nystagmus.  V,VII: No facial droop noted after several trials of grimacing and smiling, in conjunction with observation of spontaneous facial movement. Patchy sensory loss.  VIII: hearing intact to questions and commands.  IX,X: Effortful speech includes a nasal component that is intermittent and is most prominent when speech deficit described above waxes.  XI: Inconsistent responses to request for bilateral shoulder shrug XII: Minimally protrudes tongue when requested; when doing so it appears effortful/embellished.  Motor: Inconsistent responses to motor testing. Maximum elicited strength is 5/5 in upper and lower extremities proximally and distally.  Sensory: Patchy sensory loss without clear lateralization.  Deep Tendon Reflexes: 1+ and symmetric throughout in the context of  noncompliance with examiner's request to relax extremities during elicitation of DTRs.  Plantars: Right: downgoing   Left: downgoing Cerebellar: Slow, laborious attempts to perform FNF bilaterally without clear ataxia. When distracted, movements are smooth.  Gait: Deferred.    Lab Results: Basic Metabolic Panel:  Recent Labs Lab 11/04/16 1525  NA 137  K 3.7  CL 102  CO2 26  GLUCOSE 107*  BUN 16  CREATININE 1.21  CALCIUM 9.2    Liver Function Tests:  Recent Labs Lab 11/04/16 1525  AST 28  ALT 22  ALKPHOS 88  BILITOT 0.6  PROT 7.0  ALBUMIN 4.2   No results for input(s): LIPASE, AMYLASE in the last 168 hours. No results for input(s): AMMONIA in the last 168 hours.  CBC:  Recent Labs Lab 11/04/16 1525  WBC 5.6  NEUTROABS 3.7  HGB 14.6  HCT 42.7  MCV 93.0  PLT 152    Cardiac Enzymes:  Recent Labs Lab 11/04/16 1525  TROPONINI <0.03    Lipid Panel: No results for input(s): CHOL, TRIG, HDL, CHOLHDL, VLDL, LDLCALC in the last 168 hours.  CBG: No results for input(s): GLUCAP in the last 168 hours.  Microbiology: No results found for this or any previous visit.  Coagulation Studies:  Recent Labs  11/04/16 1525  LABPROT 13.8  INR 1.05    Imaging: Ct Head Wo Contrast  Result Date: 11/04/2016 CLINICAL DATA:  RIGHT-sided headache and facial droop. RIGHT-sided weakness. Symptoms for 3 days. EXAM: CT HEAD WITHOUT CONTRAST TECHNIQUE: Contiguous axial images were obtained from the base of the skull through the vertex without intravenous contrast. COMPARISON:  CT head 05/10/2013. CT head 01/12/2004. MRI brain is pending. FINDINGS: Brain: No evidence for acute infarction, hemorrhage, mass lesion, hydrocephalus, or extra-axial fluid. Mild atrophy, not unexpected for age. Slight hypoattenuation of white matter Suggesting chronic microvascular ischemic change. Vascular: Vascular calcification in the skull base internal carotid arteries. No signs of  intracranial large vessel occlusion. Skull: Normal. Negative for fracture or focal lesion. Sinuses/Orbits: BILATERAL cataract extraction. No layering sinus fluid. Other: None. IMPRESSION: Mild atrophy with suspected small vessel disease. No definite acute intracranial cortical infarction, or intracranial hemorrhage. MRI report to follow. Electronically Signed   By: Staci Righter M.D.   On: 11/04/2016 16:22   Mr Brain Wo Contrast (neuro Protocol)  Result Date: 11/04/2016 CLINICAL DATA:  RIGHT-sided weakness, RIGHT facial droop, headache and neck pain, slowed response. History of atrial fibrillation, hypertension and new medication. EXAM: MRI HEAD WITHOUT CONTRAST TECHNIQUE: Multiplanar, multiecho pulse sequences of the brain and surrounding structures were obtained without intravenous contrast. COMPARISON:  CT HEAD November 04, 2016 at 1606 hours FINDINGS: Multiple sequences are mildly motion degraded. BRAIN: No reduced diffusion to suggest acute ischemia. No susceptibility artifact to suggest hemorrhage. The ventricles and sulci are normal for patient's age. Patchy supratentorial white matter FLAIR T2 hyperintensities. No suspicious parenchymal signal, masses or mass effect. No abnormal extra-axial fluid collections. VASCULAR: Normal major intracranial vascular flow voids present at skull base. SKULL AND UPPER CERVICAL SPINE: No abnormal sellar expansion. No suspicious calvarial bone marrow signal. Craniocervical junction maintained. SINUSES/ORBITS: Trace paranasal sinus mucosal thickening. Mastoid air cells are well aerated. The included ocular globes and orbital contents are non-suspicious. Status post bilateral ocular lens implants. OTHER: None. IMPRESSION: No acute intracranial process on this mildly motion degraded examination Mild chronic small vessel ischemic disease. Electronically Signed   By: Elon Alas M.D.   On: 11/04/2016 17:14   Assessment: 1. Bizarre speech pattern, not consistent with  stroke, encephalopathy or bulbar motor neuron disease. DDx includes stress-related symptoms, malingering, factitious disorder and conversion disorder. MRI brain negative for acute or chronic stroke. Mild chronic small vessel ischemic changes are noted.  2. Severe headache. DDx includes status migrainosus and tension type headache.  3. Double vision  complaint. No exotropia or esotropia seen on exam. MRI brain negative for brainstem lesion.  4. Neck pain. DDx includes muscle spasm and radiculopathy.  5. Atrial fibrillation.   Recommendations: 1. Headache management. Consider a trial of phenergan 25 mg IV x 1. If no relief, consider Robaxin.   2. MRI cervical spine.  3. Psychology consultation.  4. Speech therapy consult re: speech pattern most consistent with psychogenic etiology.  5. Continue Xarelto. Coumadin also listed on meds list in EPIC, which would be contraindicated if on Xarelto. Would clarify and make change to outpatient medications list accordingly (most likely need to delete Coumadin from his outpatient medications listing). 6. Trial of benzodiazepine to assess if there is improvement in speech with anxiolysis.   Electronically signed: Dr. Kerney Elbe 11/04/2016, 11:22 PM

## 2016-11-04 NOTE — ED Notes (Signed)
Pt to CT and then to MRI at this time

## 2016-11-04 NOTE — ED Notes (Signed)
Pt returned from CT and MRI.  Placed back on cardiac monitor

## 2016-11-04 NOTE — ED Triage Notes (Signed)
Pt arrives via [place] after going to his PCP and them realizing that he was experiencing stroke sx. Pt states sx began on Friday (3days ago) and he thought it was a side effect of his new medications (Meloxicam and tramadol). Pt is having delayed responses, right sided weakness and a right sided facial droop. Pt also has c/o a headache and neck pain.

## 2016-11-04 NOTE — ED Notes (Addendum)
Pt given oral swab and cup of water to wet mouth per Francisco(PA)

## 2016-11-04 NOTE — ED Notes (Signed)
Pt asking about pain medication for headache and if he can leave yet.  Pt informed that we are waiting for neurology to come see him.  Pt asking differences between doctors.  This RN explained the difference between ER doctors and specialists.  Pt verbalized understanding.

## 2016-11-04 NOTE — ED Provider Notes (Signed)
[facility] DEPT Provider Note   CSN: [id number] Arrival date & time: 11/04/16  1504     History   Chief Complaint Chief Complaint  Patient presents with  . Cerebrovascular Accident    HPI Casey [name] is a 71 y.o. male with PMHx of Afib, bipolar disorder, COPD, OSA, HLD, HTN presents today by EMS for stroke symptoms that began Friday. He states he began having these symptoms and thought it was a side effect of his new medications, meloxicam and tramadol. Patient was sent here by his PCP regarding his symptoms. Patient reports associated headache and neck pain that has been ongoing and worsening for 1-2 months. He has tried nothing for his symptoms. He denies fever, chills, nausea, vomiting, diarrhea, chest pain, shortness of breath, trauma, falls.   His wife Reports him having changed from Coumadin to his arrival so about 4 months ago for treatment of A. fib that he has had for over 10 years. She states he has never had anything like this before. She denies him having any history of CVA or any other blood clots.  The history is provided by the patient, the spouse and a relative (daughter). No language interpreter was used.    Past Medical History:  Diagnosis Date  . Arthritis   . Atrial fibrillation ([place]) 01/12/2010   2D Echo EF=>55%  . Bipolar disorder ([place])   . Chronic back pain   . COPD (chronic obstructive pulmonary disease) ([place])   . Depression   . Dysrhythmia    AFib  . History of colonic polyps   . History of gout   . Hyperlipidemia   . Hypertension   . Hypothyroidism   . Morbid obesity ([place])   . OSA (obstructive sleep apnea) 10/24/2009   AHI was 27.62hr RDI was 34.3 hr REM 0.00hr; had CPAP years ago but no longer uses.  . Prostatitis   . Tubular adenoma of colon 01/2016    Patient Active Problem List   Diagnosis Date Noted  . Mild obesity 05/10/2016  . History of colonic polyps   . Diverticulosis of colon without hemorrhage   . Hyperlipidemia LDL goal  <130 05/16/2013  . KNEE PAIN 08/07/2009  . KNEE SPRAIN, ACUTE 08/07/2009  . COLONIC POLYPS, HX OF 05/10/2009  . HYPOTHYROIDISM 05/08/2009  . Bipolar disorder ([place]) 05/08/2009  . Essential hypertension 05/08/2009  . FIBRILLATION, ATRIAL 05/08/2009  . HEMATOCHEZIA 05/08/2009  . LOW BACK PAIN, CHRONIC 05/08/2009  . ABDOMINAL PAIN 05/08/2009    Past Surgical History:  Procedure Laterality Date  . APPENDECTOMY  1959  . CATARACT EXTRACTION Bilateral 1995  . COLONOSCOPY  2011   RMR: left-sided diverticula, minimal rectal friability. No polyps. Repeat 5 years.  . COLONOSCOPY WITH PROPOFOL N/A 01/15/2016   Dr.Rourk- diverticulosis in the entire examined colon, multiple rectal and colonic polyps, internal hemorrhoids. bx= tubular adenomas and hyperplastic polyps.   Marland Kitchen HAND / FINGER LESION EXCISION Right   . POLYPECTOMY  01/15/2016   Procedure: POLYPECTOMY;  Surgeon: Daneil Dolin, MD;  Location: [facility];  Service: Endoscopy;;  . TONSILLECTOMY  1955       Home Medications    Prior to Admission medications   Medication Sig Start Date End Date Taking? Authorizing Provider  Albuterol Sulfate 108 (90 Base) MCG/ACT AEPB Inhale into the lungs as needed.    Historical Provider, MD  ALPRAZolam Duanne Moron) 0.5 MG tablet Take 0.5 mg by mouth at bedtime as needed for anxiety.    Historical Provider, MD  cholecalciferol (VITAMIN D) 1000 UNITS tablet Take 2,000 Units by mouth daily.    Historical Provider, MD  citalopram (CELEXA) 20 MG tablet Take 1 tablet by mouth daily. 11/24/13   Historical Provider, MD  hydrochlorothiazide (HYDRODIURIL) 12.5 MG tablet Take 1 tablet (12.5 mg total) by mouth daily. 05/09/16   Mihai Croitoru, MD  HYDROcodone-acetaminophen (VICODIN) 5-500 MG tablet Take 1 tablet by mouth every 4 (four) hours as needed.     Historical Provider, MD  ibuprofen (ADVIL,MOTRIN) 200 MG tablet Take 200 mg by mouth every 6 (six) hours as needed.    Historical Provider, MD  Lactobacillus  (DIGESTIVE HEALTH PROBIOTIC) CAPS Take 1 tablet by mouth daily.    Historical Provider, MD  lamoTRIgine (LAMICTAL) 200 MG tablet Take 1 tablet by mouth at bedtime. Taking 150 mg at bedtime 11/24/13   Historical Provider, MD  levothyroxine (SYNTHROID, LEVOTHROID) 25 MCG tablet Take 1 tablet by mouth daily. 04/06/15   Historical Provider, MD  mesalamine (CANASA) 1000 MG suppository Place 1 suppository (1,000 mg total) rectally at bedtime. Patient not taking: Reported on 07/05/2016 04/09/16   Daneil Dolin, MD  mupirocin ointment (BACTROBAN) 2 % Apply 1 application topically daily.  12/12/15   Historical Provider, MD  naproxen sodium (ALEVE) 220 MG tablet Take 220 mg by mouth as directed.    Historical Provider, MD  NON FORMULARY Apply 1 application topically 3 (three) times daily. Cream mixture from [employer], MD  oxcarbazepine (TRILEPTAL) 600 MG tablet Take 900 mg by mouth 2 (two) times daily. Takes 300mg  in the am and 1200 mg in the pm    Historical Provider, MD  pantoprazole (PROTONIX) 40 MG tablet Take 1 tablet (40 mg total) by mouth daily. 03/05/16   Daneil Dolin, MD  polyethylene glycol-electrolytes (NULYTELY/GOLYTELY) 420 g solution Take 4,000 mLs by mouth once. Patient not taking: Reported on 07/05/2016 12/28/15   Carlis Stable, NP  rivaroxaban (XARELTO) 20 MG TABS tablet Take 20 mg by mouth daily with supper.    Historical Provider, MD  triamcinolone cream (KENALOG) 0.1 % Apply 1 application topically 2 (two) times daily.  12/12/15   Historical Provider, MD  warfarin (COUMADIN) 5 MG tablet Take 10 mg by mouth daily. AS PER INR    Historical Provider, MD    Family History Family History  Problem Relation Age of Onset  . Diabetes Mother   . Heart failure Mother   . Hypertension Mother   . Hypertension Father   . Cancer Paternal Grandmother   . Colon cancer Neg Hx     Social History Social History  Substance Use Topics  . Smoking status: Never Smoker  .  Smokeless tobacco: Never Used  . Alcohol use No     Allergies   Amlodipine and Vicodin [hydrocodone-acetaminophen]   Review of Systems Review of Systems  Constitutional: Negative for chills and fever.  Respiratory: Negative for shortness of breath.   Cardiovascular: Negative for chest pain.  Gastrointestinal: Negative for abdominal pain, diarrhea, nausea and vomiting.  Genitourinary: Negative for difficulty urinating and dysuria.  Musculoskeletal: Positive for neck pain. Negative for neck stiffness.  Neurological: Positive for facial asymmetry, speech difficulty and headaches.  All other systems reviewed and are negative.    Physical Exam Updated Vital Signs BP (!) 181/96   Pulse 72   Temp 98 F (36.7 C) (Oral)   Resp 16   SpO2 92%   Physical Exam  Constitutional: He is oriented to  person, place, and time. He appears well-developed and well-nourished.  Patient with some slurred words and difficulty finding words. He exhibits constant stuttering during exam.  HENT:  Head: Normocephalic and atraumatic.  Nose: Nose normal.  Mouth/Throat: Oropharynx is clear and moist.  Eyes: Conjunctivae and EOM are normal. Pupils are equal, round, and reactive to light.  Neck: Normal range of motion. No JVD present.  No carotid bruits noted.   Cardiovascular: Normal rate, normal heart sounds and intact distal pulses.   No murmur heard. 2+ distal pulses.   Pulmonary/Chest: Effort normal and breath sounds normal. No respiratory distress. He has no wheezes. He has no rales.  Normal work of breathing. No respiratory distress noted.   Abdominal: Soft. There is no tenderness. There is no rebound and no guarding.  Musculoskeletal: Normal range of motion.  Neurological: He is alert and oriented to person, place, and time. GCS eye subscore is 4. GCS verbal subscore is 5. GCS motor subscore is 6.  Cranial Nerves:  III,IV, VI: ptosis not present, extra-ocular movements intact bilaterally, direct  and consensual pupillary light reflexes intact bilaterally V: facial sensation, jaw opening, and bite strength equal bilaterally VII: eyebrow raise, eyelid close, smile, frown, pucker equal bilaterally VIII: hearing grossly normal bilaterally  IX,X: palate elevation and swallowing intact XI: bilateral shoulder shrug and lateral head rotation equal and strong XII: midline tongue extension  Negative pronator drift, negative Romberg, negative RAM's, negative heel-to-shin, negative finger to nose.    Noticeable tremors on exam.   Sensory intact.  Muscle strength 5/5 Patient able to stand and ambulate with some difficulty.   Skin: Skin is warm. Capillary refill takes less than 2 seconds.  Psychiatric: He has a normal mood and affect. His behavior is normal.  Nursing note and vitals reviewed.    ED Treatments / Results  Labs (all labs ordered are listed, but only abnormal results are displayed) Labs Reviewed  COMPREHENSIVE METABOLIC PANEL - Abnormal; Notable for the following:       Result Value   Glucose, Bld 107 (*)    GFR calc non Af Amer 59 (*)    All other components within normal limits  APTT  CBC WITH DIFFERENTIAL/PLATELET  ETHANOL  TROPONIN I  PROTIME-INR  RAPID URINE DRUG SCREEN, HOSP PERFORMED  URINALYSIS, ROUTINE W REFLEX MICROSCOPIC    EKG  EKG Interpretation None       Radiology Ct Head Wo Contrast  Result Date: 11/04/2016 CLINICAL DATA:  RIGHT-sided headache and facial droop. RIGHT-sided weakness. Symptoms for 3 days. EXAM: CT HEAD WITHOUT CONTRAST TECHNIQUE: Contiguous axial images were obtained from the base of the skull through the vertex without intravenous contrast. COMPARISON:  CT head 05/10/2013. CT head 01/12/2004. MRI brain is pending. FINDINGS: Brain: No evidence for acute infarction, hemorrhage, mass lesion, hydrocephalus, or extra-axial fluid. Mild atrophy, not unexpected for age. Slight hypoattenuation of white matter Suggesting chronic  microvascular ischemic change. Vascular: Vascular calcification in the skull base internal carotid arteries. No signs of intracranial large vessel occlusion. Skull: Normal. Negative for fracture or focal lesion. Sinuses/Orbits: BILATERAL cataract extraction. No layering sinus fluid. Other: None. IMPRESSION: Mild atrophy with suspected small vessel disease. No definite acute intracranial cortical infarction, or intracranial hemorrhage. MRI report to follow. Electronically Signed   By: Staci Righter M.D.   On: 11/04/2016 16:22   Mr Brain Wo Contrast (neuro Protocol)  Result Date: 11/04/2016 CLINICAL DATA:  RIGHT-sided weakness, RIGHT facial droop, headache and neck pain, slowed response. History of  atrial fibrillation, hypertension and new medication. EXAM: MRI HEAD WITHOUT CONTRAST TECHNIQUE: Multiplanar, multiecho pulse sequences of the brain and surrounding structures were obtained without intravenous contrast. COMPARISON:  CT HEAD November 04, 2016 at 1606 hours FINDINGS: Multiple sequences are mildly motion degraded. BRAIN: No reduced diffusion to suggest acute ischemia. No susceptibility artifact to suggest hemorrhage. The ventricles and sulci are normal for patient's age. Patchy supratentorial white matter FLAIR T2 hyperintensities. No suspicious parenchymal signal, masses or mass effect. No abnormal extra-axial fluid collections. VASCULAR: Normal major intracranial vascular flow voids present at skull base. SKULL AND UPPER CERVICAL SPINE: No abnormal sellar expansion. No suspicious calvarial bone marrow signal. Craniocervical junction maintained. SINUSES/ORBITS: Trace paranasal sinus mucosal thickening. Mastoid air cells are well aerated. The included ocular globes and orbital contents are non-suspicious. Status post bilateral ocular lens implants. OTHER: None. IMPRESSION: No acute intracranial process on this mildly motion degraded examination Mild chronic small vessel ischemic disease. Electronically Signed    By: Elon Alas M.D.   On: 11/04/2016 17:14    Procedures Procedures (including critical care time)  Medications Ordered in ED Medications  sodium chloride 0.9 % bolus 500 mL (500 mLs Intravenous Bolus from Bag 11/04/16 1732)    Followed by  0.9 %  sodium chloride infusion (100 mL/hr Intravenous New Bag/Given 11/04/16 1732)     Initial Impression / Assessment and Plan / ED Course  I have reviewed the triage vital signs and the nursing notes.  Pertinent labs & imaging results that were available during my care of the patient were reviewed by me and considered in my medical decision making (see chart for details).    Pt here with possible conversion disorder vs complex migraine. CT and MRI are both negative. Patient is in NAD, hemodynamically stable, afebrile. Heart and lung sounds are clear. Abdomen soft and nontender. Pt alert and oriented x 4. He exhibits constant stuttering during exam. He has noticeable tremors on exam which his wife and daughter both state is new and worsening since Friday. He is neurologically intact. Coordination is intact. Patient able to stand and ambulate with some difficulty. Imaging and labwork is reassuring.   Pt also seen and evaluated by Dr. Johnney Killian who agreed to have Neurology consulted for possible conversion disorder.   1910  I spoke with Dr. Leonel Ramsay who agreed to see and evaluate patient or have the night neurologist see him.   At shift change care was transferred to Lorre Munroe, PA-C who will follow pending evaluation by neurology, re-evaulate and determine disposition.    Final Clinical Impressions(s) / ED Diagnoses   Final diagnoses:  None    New Prescriptions New Prescriptions   No medications on file     [place], [place] 11/04/16 2021    Charlesetta Shanks, MD 11/05/16 [phone]

## 2016-11-04 NOTE — ED Provider Notes (Signed)
Care was continued by Dr. Johnney Killian.  I did not see or examine the patient.   Montine Circle, PA-C 11/04/16 2350    Charlesetta Shanks, MD 11/05/16 Laureen Abrahams

## 2016-11-07 ENCOUNTER — Encounter: Payer: Self-pay | Admitting: Neurology

## 2016-11-07 ENCOUNTER — Ambulatory Visit (INDEPENDENT_AMBULATORY_CARE_PROVIDER_SITE_OTHER): Payer: Medicare Other | Admitting: Neurology

## 2016-11-07 VITALS — BP 157/90 | HR 72 | Ht 73.0 in | Wt 231.0 lb

## 2016-11-07 DIAGNOSIS — F444 Conversion disorder with motor symptom or deficit: Secondary | ICD-10-CM

## 2016-11-07 DIAGNOSIS — G44209 Tension-type headache, unspecified, not intractable: Secondary | ICD-10-CM | POA: Diagnosis not present

## 2016-11-07 NOTE — Patient Instructions (Addendum)
I had a long discussion with the patient, wife and daughter about his speech difficulties which appear to be likely conversion disorder with psychogenic features and underlying psychosocial stressors. I do not feel his speech difficulties are related to stroke or other organic neurological condition. I recommend he see his psychiatrist Dr. Cristy Friedlander to discuss medication management. He also has tension headaches and recommend he do regular neck stretching exercises and he already has an appointment for headache and numbness Center advised him to keep that. I do not believe further neurological testing is necessary at the present time. No scheduled follow-up appointment made

## 2016-11-07 NOTE — Progress Notes (Signed)
[facility] [address] [place]. [place] [address] [phone]       OFFICE CONSULT NOTE  Casey. Casey [name] Date of Birth:  12/28/1945 Medical Record Number:  [id number]   Referring MD:  Charlesetta Shanks  Reason for Referral:   Speech difficulty  HPI: Casey [name] is a 71 year Caucasian male who is referred today from the emergency room where he was evaluated for episode of speech difficulty which has persisted. He is accompanied by his wife and daughter who complement his history. I have reviewed electronic medical records from recent ER visit as well as personally reviewed imaging films. He states that his symptoms began about 2 months ago when he started having headache and neck pain. He has been taking Mobic for arthritis as well as tramadol which provides some relief. His primary physician has in fact referred him for headache and [facility] clinic and he has an upcoming appointment to be seen there. He noticed 3 days ago that he had some trouble speaking getting words out and completing sentences and does struggle and started talking. His symptoms worsened over the weekend and Unas[place] in the emergency room on 11/04/16. Speech was evaluated by Dr. Christy Sartorius neuro hospitalists and noticed that his speech was slow, laborious, dysarthric and halting in quality. He initially saw his PCP thought symptoms were related to stroke. Apparently patient had some right-sided weakness and facial droop mentioned as well. However today the patient and family members do not mention this to me. He had MRI scan of the brain done that day which have personally reviewed shows no acute abnormality. There are mild age-related changes of chronic small vessel disease. Patient does have a history of atrial fibrillation and does take Xarelto for that. He does have underlying psychiatric condition bipolar disorder for which he sees the physician assistant Comer Locket and Dr. Morey Hummingbird Cottrell's office. He has  not yet been evaluated for his present symptoms by them. Dr. 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 The patient had 2 hence informed uncle that he could no longer provide care for him. He was upset and did mention this to his wife. He denies any prior history of strokes, TIAs, migraine headaches, significant head injury with loss of consciousness. He states his bipolar disorder is stable and he has been on the current dosages of Trileptal, Xanax for a long time. The only new medication changes or arthritic pain medication Mobic    ROS:   14 system review of systems is positive for  leg swelling, blurred vision, double vision, confusion, headache, numbness, weakness, slurred speech, anxiety,  muscles skin sensitivity and all other systems negative  PMH:  Past Medical History:  Diagnosis Date  . Arthritis   . Atrial fibrillation ([place]) 01/12/2010   2D Echo EF=>55%  . Bipolar disorder ([place])   . Chronic back pain   . COPD (chronic obstructive pulmonary disease) ([place])   . Depression   . Dysrhythmia    AFib  . History of colonic polyps   . History of gout   . Hyperlipidemia   . Hypertension   . Hypothyroidism   .  Morbid obesity ([place])   . OSA (obstructive sleep apnea) 10/24/2009   AHI was 27.62hr RDI was 34.3 hr REM 0.00hr; had CPAP years ago but no longer uses.  . Prostatitis   . Tubular adenoma of colon 01/2016    Social History:  Social History   Social History  . Marital status: Married    Spouse name: N/A  . Number of children: N/A  . Years of education: N/A   Occupational History  . Not on file.   Social History Main Topics  .  Smoking status: Never Smoker  . Smokeless tobacco: Never Used  . Alcohol use No  . Drug use: No  . Sexual activity: Yes    Birth control/ protection: None   Other Topics Concern  . Not on file   Social History Narrative  . No narrative on file    Medications:   Current Outpatient Prescriptions on File Prior to Visit  Medication Sig Dispense Refill  . acetaminophen (TYLENOL) 500 MG tablet Take 500 mg by mouth every 6 (six) hours as needed for mild pain.    Marland Kitchen albuterol (PROVENTIL HFA;VENTOLIN HFA) 108 (90 Base) MCG/ACT inhaler Inhale 1-2 puffs into the lungs every 6 (six) hours as needed for wheezing or shortness of breath.    . ALPRAZolam (XANAX) 0.5 MG tablet Take 0.5 mg by mouth at bedtime as needed for anxiety.    . cholecalciferol (VITAMIN D) 1000 UNITS tablet Take 2,000 Units by mouth daily.    . citalopram (CELEXA) 20 MG tablet Take 1 tablet by mouth daily.    Marland Kitchen dimenhyDRINATE (DRAMAMINE) 50 MG tablet Take 50 mg by mouth every 8 (eight) hours as needed for dizziness.    . docusate sodium (COLACE) 50 MG capsule Take 50 mg by mouth 2 (two) times daily.    . hydrochlorothiazide (HYDRODIURIL) 12.5 MG tablet Take 1 tablet (12.5 mg total) by mouth daily. 90 tablet 3  . Lactobacillus (DIGESTIVE HEALTH PROBIOTIC) CAPS Take 1 tablet by mouth daily.    Marland Kitchen lamoTRIgine (LAMICTAL) 150 MG tablet Take 150 mg by mouth at bedtime.    Marland Kitchen levothyroxine (SYNTHROID, LEVOTHROID) 25 MCG tablet Take 1 tablet by mouth daily.    . meloxicam (MOBIC) 7.5 MG tablet Take 7.5 mg by mouth daily.    . mesalamine (CANASA) 1000 MG suppository Place 1 suppository (1,000 mg total) rectally at bedtime. 30 suppository 0  . oxcarbazepine (TRILEPTAL) 600 MG tablet Take 300-1,200 mg by mouth See admin instructions. Takes 300mg  in the am and 1200 mg in the pm    . pantoprazole (PROTONIX) 40 MG tablet Take 1 tablet (40 mg total) by mouth daily. 30 tablet 11  . polyethylene glycol-electrolytes (NULYTELY/GOLYTELY) 420 g  solution Take 4,000 mLs by mouth once. 4000 mL 0  . rivaroxaban (XARELTO) 20 MG TABS tablet Take 20 mg by mouth daily with supper.    . traMADol (ULTRAM) 50 MG tablet Take 50 mg by mouth 3 (three) times daily as needed for moderate pain.     No current facility-administered medications on file prior to visit.     Allergies:   Allergies  Allergen Reactions  . Amlodipine   . Vicodin [Hydrocodone-Acetaminophen]     Patient is unsure of reaction    Physical Exam General: well developed, well nourished 61 Caucasian male, seated, in no evident distress Head: head normocephalic and atraumatic.   Neck: supple with no carotid or supraclavicular bruits Cardiovascular: regular rate and rhythm, no murmurs Musculoskeletal: no deformity  Skin:  no rash/petichiae Vascular:  Normal pulses all extremities  Neurologic Exam Mental Status: Awake and fully alert. Oriented to place and time. Recent and remote memory intact. Attention span, concentration and fund of knowledge appropriate. Mood and affect appropriate. Speech is bizarre with intermittent word hesitancy, word finding difficulty but at times can speak a few words and even short sentences clearly. He is able to write sentences quite clearly without hesitancy but except for mild tremulousness Cranial Nerves: Fundoscopic exam reveals sharp disc margins. Pupils equal, briskly reactive to light. Extraocular movements full without nystagmus. Visual fields full to confrontation. Hearing intact. Facial sensation intact. Face, tongue, palate moves normally and symmetrically.  Motor: Normal bulk and tone. Normal strength in all tested extremity muscles. Mild bilateral action tremors of the upper extremities which diminish at rest or rigidity or bradykinesia. Sensory.: intact to touch , pinprick , position and vibratory sensation.  Coordination: Rapid alternating movements normal in all extremities. Finger-to-nose and heel-to-shin performed accurately  bilaterally. Gait and Station: Arises from chair without difficulty. Stance is stooped stooped cautious with favoring of his back and bending forward. Gait . Not able to heel, toe and tandem walk without difficulty.  Reflexes: 1+ and symmetric. Toes downgoing.       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 Otherwise unremarkable neurological exam and brain imaging studies. He does have underlying psychosocial stressors and history of bipolar disorder at baseline. He also has chronic headaches which are likely muscle tension headaches.   PLAN: I had a long discussion with the patient, wife and daughter about his speech difficulties which appear to be likely conversion disorder with psychogenic features and underlying psychosocial stressors. I do not feel his speech difficulties are related to stroke or other organic neurological condition. I recommend he see his psychiatrist Dr. Cristy Friedlander to discuss medication management. He also has tension headaches and recommend he do regular neck stretching exercises and he already has an appointment for [facility]  And I advised him to keep that. I do not believe further neurological testing is necessary at the present time. Greater than 50% time during this 45 minute consultation visit was spent on counseling and coordination of care about his speech difficulties discussion of psychosocial stressors and answering questions No scheduled follow-up appointment made Antony Contras, MD  [facility] [address] [place], [place] [address]  Phone [phone] Fax [phone] Note: This document was prepared with digital dictation and possible smart phrase technology. Any transcriptional errors that result from this process are unintentional.

## 2016-11-11 DIAGNOSIS — F319 Bipolar disorder, unspecified: Secondary | ICD-10-CM | POA: Diagnosis not present

## 2016-11-13 DIAGNOSIS — M542 Cervicalgia: Secondary | ICD-10-CM | POA: Diagnosis not present

## 2016-11-13 DIAGNOSIS — M25611 Stiffness of right shoulder, not elsewhere classified: Secondary | ICD-10-CM | POA: Diagnosis not present

## 2016-11-13 DIAGNOSIS — M25511 Pain in right shoulder: Secondary | ICD-10-CM | POA: Diagnosis not present

## 2016-11-13 DIAGNOSIS — M6281 Muscle weakness (generalized): Secondary | ICD-10-CM | POA: Diagnosis not present

## 2016-11-14 DIAGNOSIS — M19011 Primary osteoarthritis, right shoulder: Secondary | ICD-10-CM | POA: Diagnosis not present

## 2016-11-14 DIAGNOSIS — M6281 Muscle weakness (generalized): Secondary | ICD-10-CM | POA: Diagnosis not present

## 2016-11-14 DIAGNOSIS — M25611 Stiffness of right shoulder, not elsewhere classified: Secondary | ICD-10-CM | POA: Diagnosis not present

## 2016-11-14 DIAGNOSIS — F3162 Bipolar disorder, current episode mixed, moderate: Secondary | ICD-10-CM | POA: Diagnosis not present

## 2016-11-14 DIAGNOSIS — M25511 Pain in right shoulder: Secondary | ICD-10-CM | POA: Diagnosis not present

## 2016-11-14 DIAGNOSIS — Z6833 Body mass index (BMI) 33.0-33.9, adult: Secondary | ICD-10-CM | POA: Diagnosis not present

## 2016-11-14 DIAGNOSIS — M542 Cervicalgia: Secondary | ICD-10-CM | POA: Diagnosis not present

## 2016-11-19 DIAGNOSIS — M6281 Muscle weakness (generalized): Secondary | ICD-10-CM | POA: Diagnosis not present

## 2016-11-19 DIAGNOSIS — M25511 Pain in right shoulder: Secondary | ICD-10-CM | POA: Diagnosis not present

## 2016-11-19 DIAGNOSIS — M25611 Stiffness of right shoulder, not elsewhere classified: Secondary | ICD-10-CM | POA: Diagnosis not present

## 2016-11-19 DIAGNOSIS — M542 Cervicalgia: Secondary | ICD-10-CM | POA: Diagnosis not present

## 2016-11-21 DIAGNOSIS — M542 Cervicalgia: Secondary | ICD-10-CM | POA: Diagnosis not present

## 2016-11-21 DIAGNOSIS — M25511 Pain in right shoulder: Secondary | ICD-10-CM | POA: Diagnosis not present

## 2016-11-21 DIAGNOSIS — M6281 Muscle weakness (generalized): Secondary | ICD-10-CM | POA: Diagnosis not present

## 2016-11-21 DIAGNOSIS — M25611 Stiffness of right shoulder, not elsewhere classified: Secondary | ICD-10-CM | POA: Diagnosis not present

## 2016-11-26 DIAGNOSIS — M25611 Stiffness of right shoulder, not elsewhere classified: Secondary | ICD-10-CM | POA: Diagnosis not present

## 2016-11-26 DIAGNOSIS — M25511 Pain in right shoulder: Secondary | ICD-10-CM | POA: Diagnosis not present

## 2016-11-26 DIAGNOSIS — M6281 Muscle weakness (generalized): Secondary | ICD-10-CM | POA: Diagnosis not present

## 2016-11-26 DIAGNOSIS — M542 Cervicalgia: Secondary | ICD-10-CM | POA: Diagnosis not present

## 2016-11-28 DIAGNOSIS — M25611 Stiffness of right shoulder, not elsewhere classified: Secondary | ICD-10-CM | POA: Diagnosis not present

## 2016-11-28 DIAGNOSIS — M25511 Pain in right shoulder: Secondary | ICD-10-CM | POA: Diagnosis not present

## 2016-11-28 DIAGNOSIS — M542 Cervicalgia: Secondary | ICD-10-CM | POA: Diagnosis not present

## 2016-11-28 DIAGNOSIS — M6281 Muscle weakness (generalized): Secondary | ICD-10-CM | POA: Diagnosis not present

## 2016-11-29 DIAGNOSIS — F319 Bipolar disorder, unspecified: Secondary | ICD-10-CM | POA: Diagnosis not present

## 2016-12-02 DIAGNOSIS — M25511 Pain in right shoulder: Secondary | ICD-10-CM | POA: Diagnosis not present

## 2016-12-02 DIAGNOSIS — M542 Cervicalgia: Secondary | ICD-10-CM | POA: Diagnosis not present

## 2016-12-02 DIAGNOSIS — F319 Bipolar disorder, unspecified: Secondary | ICD-10-CM | POA: Diagnosis not present

## 2016-12-06 DIAGNOSIS — M25511 Pain in right shoulder: Secondary | ICD-10-CM | POA: Diagnosis not present

## 2016-12-06 DIAGNOSIS — M542 Cervicalgia: Secondary | ICD-10-CM | POA: Diagnosis not present

## 2016-12-12 DIAGNOSIS — G4440 Drug-induced headache, not elsewhere classified, not intractable: Secondary | ICD-10-CM | POA: Diagnosis not present

## 2016-12-12 DIAGNOSIS — R51 Headache: Secondary | ICD-10-CM | POA: Diagnosis not present

## 2016-12-12 DIAGNOSIS — G4452 New daily persistent headache (NDPH): Secondary | ICD-10-CM | POA: Diagnosis not present

## 2016-12-12 DIAGNOSIS — Z049 Encounter for examination and observation for unspecified reason: Secondary | ICD-10-CM | POA: Diagnosis not present

## 2016-12-13 DIAGNOSIS — M791 Myalgia: Secondary | ICD-10-CM | POA: Diagnosis not present

## 2016-12-13 DIAGNOSIS — R51 Headache: Secondary | ICD-10-CM | POA: Diagnosis not present

## 2016-12-13 DIAGNOSIS — G4452 New daily persistent headache (NDPH): Secondary | ICD-10-CM | POA: Diagnosis not present

## 2016-12-13 DIAGNOSIS — M542 Cervicalgia: Secondary | ICD-10-CM | POA: Diagnosis not present

## 2016-12-13 DIAGNOSIS — G518 Other disorders of facial nerve: Secondary | ICD-10-CM | POA: Diagnosis not present

## 2016-12-19 ENCOUNTER — Other Ambulatory Visit (HOSPITAL_COMMUNITY)
Admission: RE | Admit: 2016-12-19 | Discharge: 2016-12-19 | Disposition: A | Discharge status: Home / Self Care | Payer: Medicare Other | Source: Ambulatory Visit | Attending: Urology | Admitting: Urology

## 2016-12-19 DIAGNOSIS — Z125 Encounter for screening for malignant neoplasm of prostate: Secondary | ICD-10-CM | POA: Insufficient documentation

## 2016-12-20 LAB — PSA (REFLEX TO FREE) (SERIAL): Prostate Specific Ag, Serum: 1.5 ng/mL (ref 0.0–4.0)

## 2016-12-24 DIAGNOSIS — I10 Essential (primary) hypertension: Secondary | ICD-10-CM | POA: Diagnosis not present

## 2016-12-24 DIAGNOSIS — M5020 Other cervical disc displacement, unspecified cervical region: Secondary | ICD-10-CM | POA: Diagnosis not present

## 2016-12-24 DIAGNOSIS — M4722 Other spondylosis with radiculopathy, cervical region: Secondary | ICD-10-CM | POA: Diagnosis not present

## 2016-12-24 DIAGNOSIS — M5030 Other cervical disc degeneration, unspecified cervical region: Secondary | ICD-10-CM | POA: Diagnosis not present

## 2016-12-24 DIAGNOSIS — M542 Cervicalgia: Secondary | ICD-10-CM | POA: Diagnosis not present

## 2016-12-24 DIAGNOSIS — M75101 Unspecified rotator cuff tear or rupture of right shoulder, not specified as traumatic: Secondary | ICD-10-CM | POA: Diagnosis not present

## 2016-12-24 DIAGNOSIS — Z6832 Body mass index (BMI) 32.0-32.9, adult: Secondary | ICD-10-CM | POA: Diagnosis not present

## 2016-12-25 DIAGNOSIS — M542 Cervicalgia: Secondary | ICD-10-CM | POA: Diagnosis not present

## 2016-12-25 DIAGNOSIS — M25511 Pain in right shoulder: Secondary | ICD-10-CM | POA: Diagnosis not present

## 2016-12-26 ENCOUNTER — Telehealth: Payer: Self-pay | Admitting: Neurology

## 2016-12-26 NOTE — Telephone Encounter (Signed)
Patients wife called office in reference to patient having slurred speech over lunch per wife she called psychologist that advised her to call our office per Dr. Leonie Man note no follow up needed advised to follow up with his psychologist.  Dr. Clydene Fake nurse was consulted was told that slurred speech has stopped patient advised to go to the hospital for slurred speech.

## 2016-12-26 NOTE — Progress Notes (Signed)
Receive report from [facility] psychiatric group. The PA stated on report their office feels pt does not have conversion disorder.He had temporary dysphonia, and they feel he does not need pyschotherapy. The report states they feel his symptoms are likely organic in nature. This report was reviewed by Dr. Leonie Man on 12/19/2016.

## 2016-12-27 ENCOUNTER — Ambulatory Visit: Payer: Medicare Other | Admitting: Urology

## 2016-12-27 ENCOUNTER — Emergency Department (HOSPITAL_COMMUNITY)
Admission: EM | Admit: 2016-12-27 | Discharge: 2016-12-27 | Disposition: A | Discharge status: Home / Self Care | Payer: Medicare Other | Attending: Emergency Medicine | Admitting: Emergency Medicine

## 2016-12-27 ENCOUNTER — Encounter (HOSPITAL_COMMUNITY): Payer: Self-pay | Admitting: Neurology

## 2016-12-27 ENCOUNTER — Emergency Department (HOSPITAL_COMMUNITY): Payer: Medicare Other

## 2016-12-27 DIAGNOSIS — J449 Chronic obstructive pulmonary disease, unspecified: Secondary | ICD-10-CM | POA: Insufficient documentation

## 2016-12-27 DIAGNOSIS — E785 Hyperlipidemia, unspecified: Secondary | ICD-10-CM | POA: Diagnosis not present

## 2016-12-27 DIAGNOSIS — Z79899 Other long term (current) drug therapy: Secondary | ICD-10-CM | POA: Insufficient documentation

## 2016-12-27 DIAGNOSIS — I4891 Unspecified atrial fibrillation: Secondary | ICD-10-CM | POA: Diagnosis not present

## 2016-12-27 DIAGNOSIS — R51 Headache: Secondary | ICD-10-CM | POA: Insufficient documentation

## 2016-12-27 DIAGNOSIS — E039 Hypothyroidism, unspecified: Secondary | ICD-10-CM | POA: Diagnosis not present

## 2016-12-27 DIAGNOSIS — I10 Essential (primary) hypertension: Secondary | ICD-10-CM | POA: Insufficient documentation

## 2016-12-27 DIAGNOSIS — R4781 Slurred speech: Secondary | ICD-10-CM | POA: Diagnosis not present

## 2016-12-27 DIAGNOSIS — R519 Headache, unspecified: Secondary | ICD-10-CM

## 2016-12-27 LAB — COMPREHENSIVE METABOLIC PANEL
ALBUMIN: 4.2 g/dL (ref 3.5–5.0)
ALK PHOS: 85 U/L (ref 38–126)
ALT: 23 U/L (ref 17–63)
AST: 28 U/L (ref 15–41)
Anion gap: 7 (ref 5–15)
BUN: 13 mg/dL (ref 6–20)
CALCIUM: 9.5 mg/dL (ref 8.9–10.3)
CO2: 27 mmol/L (ref 22–32)
Chloride: 103 mmol/L (ref 101–111)
Creatinine, Ser: 1.15 mg/dL (ref 0.61–1.24)
GFR calc non Af Amer: >60 mL/min (ref >60)
GLUCOSE: 92 mg/dL (ref 65–99)
POTASSIUM: 3.5 mmol/L (ref 3.5–5.1)
SODIUM: 137 mmol/L (ref 135–145)
Total Bilirubin: 1 mg/dL (ref 0.3–1.2)
Total Protein: 6.6 g/dL (ref 6.5–8.1)

## 2016-12-27 LAB — I-STAT CHEM 8, ED
BUN: 13 mg/dL (ref 6–20)
CALCIUM ION: 1.18 mmol/L (ref 1.15–1.40)
Chloride: 102 mmol/L (ref 101–111)
Creatinine, Ser: 1.1 mg/dL (ref 0.61–1.24)
Glucose, Bld: 88 mg/dL (ref 65–99)
HEMATOCRIT: 40 % (ref 39.0–52.0)
HEMOGLOBIN: 13.6 g/dL (ref 13.0–17.0)
Potassium: 3.5 mmol/L (ref 3.5–5.1)
SODIUM: 140 mmol/L (ref 135–145)
TCO2: 27 mmol/L (ref 0–100)

## 2016-12-27 LAB — URINALYSIS, ROUTINE W REFLEX MICROSCOPIC
BILIRUBIN URINE: NEGATIVE
Glucose, UA: NEGATIVE mg/dL
HGB URINE DIPSTICK: NEGATIVE
Ketones, ur: NEGATIVE mg/dL
NITRITE: NEGATIVE
PROTEIN: NEGATIVE mg/dL
SQUAMOUS EPITHELIAL / LPF: NONE SEEN
Specific Gravity, Urine: 1.013 (ref 1.005–1.030)
pH: 7 (ref 5.0–8.0)

## 2016-12-27 LAB — RAPID URINE DRUG SCREEN, HOSP PERFORMED
AMPHETAMINES: NOT DETECTED
Barbiturates: NOT DETECTED
Benzodiazepines: NOT DETECTED
COCAINE: NOT DETECTED
Opiates: NOT DETECTED
Tetrahydrocannabinol: NOT DETECTED

## 2016-12-27 LAB — ETHANOL

## 2016-12-27 LAB — CBC
HCT: 40.7 % (ref 39.0–52.0)
Hemoglobin: 13.8 g/dL (ref 13.0–17.0)
MCH: 31.9 pg (ref 26.0–34.0)
MCHC: 33.9 g/dL (ref 30.0–36.0)
MCV: 94.2 fL (ref 78.0–100.0)
PLATELETS: 158 10*3/uL (ref 150–400)
RBC: 4.32 MIL/uL (ref 4.22–5.81)
RDW: 13.2 % (ref 11.5–15.5)
WBC: 5.9 10*3/uL (ref 4.0–10.5)

## 2016-12-27 LAB — PROTIME-INR
INR: 1.2
PROTHROMBIN TIME: 15.3 s — AB (ref 11.4–15.2)

## 2016-12-27 LAB — DIFFERENTIAL
BASOS PCT: 0 %
Basophils Absolute: 0 10*3/uL (ref 0.0–0.1)
EOS ABS: 0.3 10*3/uL (ref 0.0–0.7)
EOS PCT: 4 %
LYMPHS PCT: 22 %
Lymphs Abs: 1.3 10*3/uL (ref 0.7–4.0)
MONO ABS: 0.4 10*3/uL (ref 0.1–1.0)
Monocytes Relative: 6 %
NEUTROS PCT: 68 %
Neutro Abs: 4 10*3/uL (ref 1.7–7.7)

## 2016-12-27 LAB — CBG MONITORING, ED: GLUCOSE-CAPILLARY: 96 mg/dL (ref 65–99)

## 2016-12-27 LAB — I-STAT TROPONIN, ED: Troponin i, poc: 0 ng/mL (ref 0.00–0.08)

## 2016-12-27 LAB — APTT: aPTT: 36 seconds (ref 24–36)

## 2016-12-27 MED ORDER — PROMETHAZINE HCL 25 MG/ML IJ SOLN
25.0000 mg | Freq: Once | INTRAMUSCULAR | Status: AC
  Administered 2016-12-27: 25 mg via INTRAVENOUS
  Filled 2016-12-27: qty 1

## 2016-12-27 MED ORDER — METOCLOPRAMIDE HCL 5 MG/ML IJ SOLN
10.0000 mg | Freq: Once | INTRAMUSCULAR | Status: AC
  Administered 2016-12-27: 10 mg via INTRAVENOUS
  Filled 2016-12-27: qty 2

## 2016-12-27 MED ORDER — GABAPENTIN 100 MG PO CAPS
100.0000 mg | ORAL_CAPSULE | Freq: Once | ORAL | Status: AC
  Administered 2016-12-27: 100 mg via ORAL
  Filled 2016-12-27: qty 1

## 2016-12-27 MED ORDER — DIPHENHYDRAMINE HCL 50 MG/ML IJ SOLN
25.0000 mg | Freq: Once | INTRAMUSCULAR | Status: AC
  Administered 2016-12-27: 25 mg via INTRAVENOUS
  Filled 2016-12-27: qty 1

## 2016-12-27 MED ORDER — DIPHENHYDRAMINE HCL 50 MG/ML IJ SOLN: 25.0000 mg | Freq: Once | INTRAMUSCULAR | Status: DC

## 2016-12-27 MED ORDER — METOCLOPRAMIDE HCL 5 MG/ML IJ SOLN: 10.0000 mg | Freq: Once | INTRAMUSCULAR | Status: DC

## 2016-12-27 NOTE — ED Provider Notes (Signed)
7:23 PM Assumed care from Dr. Ralene Bathe, please see their note for full history, physical and decision making until this point. In brief this is a 71 y.o. year old male who presented to the ED tonight with No chief complaint on file.     Plan for MRI to rule out stroke for atypical neuro symptoms that are more likely related to conversion disorder or something similar.  MRI negative. Normal speech on my exam, still with headache but similar to multiple previous chronic headaches for which he is getting shots at the headache center to try and alleviate. Will give home dose of neurontin, otherwise stable for discharge.  Discharge instructions, including strict return precautions for new or worsening symptoms, given. Patient and/or family verbalized understanding and agreement with the plan as described.   Labs, studies and imaging reviewed by myself and considered in medical decision making if ordered. Imaging interpreted by radiology.  Labs Reviewed  PROTIME-INR - Abnormal; Notable for the following:       Result Value   Prothrombin Time 15.3 (*)    All other components within normal limits  URINALYSIS, ROUTINE W REFLEX MICROSCOPIC - Abnormal; Notable for the following:    Leukocytes, UA SMALL (*)    Bacteria, UA MANY (*)    All other components within normal limits  ETHANOL  APTT  CBC  DIFFERENTIAL  COMPREHENSIVE METABOLIC PANEL  RAPID URINE DRUG SCREEN, HOSP PERFORMED  I-STAT CHEM 8, ED  I-STAT TROPOININ, ED  CBG MONITORING, ED    MR Angiogram Head Wo Contrast  Final Result    MR Brain Wo Contrast  Final Result    CT Head Wo Contrast  Final Result      No Follow-up on file.    Merrily Pew, MD 12/27/16 [phone]

## 2016-12-27 NOTE — ED Notes (Signed)
Pt. Transported to MRI at this time.  

## 2016-12-27 NOTE — ED Notes (Signed)
Family at bedside. 

## 2016-12-27 NOTE — ED Notes (Signed)
Pt. Returned from MRI at this time.  

## 2016-12-27 NOTE — ED Notes (Signed)
ED Provider at bedside. 

## 2016-12-27 NOTE — ED Provider Notes (Signed)
[facility] DEPT Provider Note   CSN: [id number] Arrival date & time: 12/27/16  1324     History   Chief Complaint No chief complaint on file.   HPI Casey [name] is a 71 y.o. male.  The history is provided by the patient. No language interpreter was used.    [name] is a 71 y.o. male who presents to the Emergency Department complaining of headache, weakness.  Last night he developed a severe headache. Today at 11:00 he develops worsening of his headache with speech difficulties and left-sided weakness. He had similar episodes back in April and had evaluation with CT and MRI that were negative. At that time he was thought to have either a conversion disorder versus complicated migraine. No reports of fever or vomiting.  He does have a hx/o afib and takes Xarelto.    Past Medical History:  Diagnosis Date  . Arthritis   . Atrial fibrillation ([place]) 01/12/2010   2D Echo EF=>55%  . Bipolar disorder ([place])   . Chronic back pain   . COPD (chronic obstructive pulmonary disease) ([place])   . Depression   . Dysrhythmia    AFib  . History of colonic polyps   . History of gout   . Hyperlipidemia   . Hypertension   . Hypothyroidism   . Morbid obesity ([place])   . OSA (obstructive sleep apnea) 10/24/2009   AHI was 27.62hr RDI was 34.3 hr REM 0.00hr; had CPAP years ago but no longer uses.  . Prostatitis   . Tubular adenoma of colon 01/2016    Patient Active Problem List   Diagnosis Date Noted  . Mild obesity 05/10/2016  . History of colonic polyps   . Diverticulosis of colon without hemorrhage   . Hyperlipidemia LDL goal <130 05/16/2013  . KNEE PAIN 08/07/2009  . KNEE SPRAIN, ACUTE 08/07/2009  . COLONIC POLYPS, HX OF 05/10/2009  . HYPOTHYROIDISM 05/08/2009  . Bipolar disorder ([place]) 05/08/2009  . Essential hypertension 05/08/2009  . FIBRILLATION, ATRIAL 05/08/2009  . HEMATOCHEZIA 05/08/2009  . LOW BACK PAIN, CHRONIC 05/08/2009  . ABDOMINAL PAIN 05/08/2009    Past  Surgical History:  Procedure Laterality Date  . APPENDECTOMY  1959  . asthma    . CATARACT EXTRACTION Bilateral 1995  . COLONOSCOPY  2011   RMR: left-sided diverticula, minimal rectal friability. No polyps. Repeat 5 years.  . COLONOSCOPY WITH PROPOFOL N/A 01/15/2016   Dr.Rourk- diverticulosis in the entire examined colon, multiple rectal and colonic polyps, internal hemorrhoids. bx= tubular adenomas and hyperplastic polyps.   Marland Kitchen HAND / FINGER LESION EXCISION Right   . POLYPECTOMY  01/15/2016   Procedure: POLYPECTOMY;  Surgeon: Daneil Dolin, MD;  Location: [facility] SUITE;  Service: Endoscopy;;  . TONSILLECTOMY  1955       Home Medications    Prior to Admission medications   Medication Sig Start Date End Date Taking? Authorizing Provider  acetaminophen (TYLENOL) 500 MG tablet Take 500 mg by mouth every 6 (six) hours as needed for mild pain.    [provider]  albuterol (PROVENTIL HFA;VENTOLIN HFA) 108 (90 Base) MCG/ACT inhaler Inhale 1-2 puffs into the lungs every 6 (six) hours as needed for wheezing or shortness of breath.    [provider]  ALPRAZolam Duanne Moron) 0.5 MG tablet Take 0.5 mg by mouth at bedtime as needed for anxiety.    [provider]  cholecalciferol (VITAMIN D) 1000 UNITS tablet Take 2,000 Units by mouth daily.    [provider]  citalopram (CELEXA) 20 MG tablet Take 1 tablet by mouth daily. 11/24/13   [provider]  dimenhyDRINATE (DRAMAMINE) 50 MG tablet Take 50 mg by mouth every 8 (eight) hours as needed for dizziness.    [provider]  docusate sodium (COLACE) 50 MG capsule Take 50 mg by mouth 2 (two) times daily.    [provider]  hydrochlorothiazide (HYDRODIURIL) 12.5 MG tablet Take 1 tablet (12.5 mg total) by mouth daily. 05/09/16   Croitoru, Mihai, MD  Lactobacillus (DIGESTIVE HEALTH PROBIOTIC) CAPS Take 1 tablet by mouth daily.    [provider]  lamoTRIgine (LAMICTAL) 150 MG tablet Take  150 mg by mouth at bedtime.    [provider]  levothyroxine (SYNTHROID, LEVOTHROID) 25 MCG tablet Take 1 tablet by mouth daily. 04/06/15   [provider]  meloxicam (MOBIC) 7.5 MG tablet Take 7.5 mg by mouth daily.    [provider]  mesalamine (CANASA) 1000 MG suppository Place 1 suppository (1,000 mg total) rectally at bedtime. 04/09/16   Rourk, Cristopher Estimable, MD  oxcarbazepine (TRILEPTAL) 600 MG tablet Take 300-1,200 mg by mouth See admin instructions. Takes 300mg  in the am and 1200 mg in the pm    [provider]  pantoprazole (PROTONIX) 40 MG tablet Take 1 tablet (40 mg total) by mouth daily. 03/05/16   Rourk, Cristopher Estimable, MD  polyethylene glycol-electrolytes (NULYTELY/GOLYTELY) 420 g solution Take 4,000 mLs by mouth once. 12/28/15   Carlis Stable, NP  rivaroxaban (XARELTO) 20 MG TABS tablet Take 20 mg by mouth daily with supper.    [provider]  traMADol (ULTRAM) 50 MG tablet Take 50 mg by mouth 3 (three) times daily as needed for moderate pain.    [provider]    Family History Family History  Problem Relation Age of Onset  . Diabetes Mother   . Heart failure Mother   . Hypertension Mother   . Hypertension Father   . Cancer Paternal Grandmother   . Stroke Sister   . Colon cancer Neg Hx     Social History Social History  Substance Use Topics  . Smoking status: Never Smoker  . Smokeless tobacco: Never Used  . Alcohol use No     Allergies   Amlodipine and Vicodin [hydrocodone-acetaminophen]   Review of Systems Review of Systems  All other systems reviewed and are negative.    Physical Exam Updated Vital Signs BP (!) 151/76   Pulse 79   Temp 97.5 F (36.4 C) (Oral)   Resp 18   Ht 5\' 11"  (1.803 m)   Wt 107 kg (236 lb)   SpO2 97%   BMI 32.92 kg/m   Physical Exam  Constitutional: He is oriented to person, place, and time. He appears well-developed and well-nourished.  HENT:  Head: Normocephalic and  atraumatic.  Neck: Neck supple.  Cardiovascular: Normal rate and regular rhythm.   No murmur heard. Pulmonary/Chest: Effort normal and breath sounds normal. No respiratory distress.  Abdominal: Soft. There is no tenderness. There is no rebound and no guarding.  Musculoskeletal: He exhibits no edema or tenderness.  Neurological: He is alert and oriented to person, place, and time.  Stuttering speech with frequent changes in the volume of his speech. Tremor on intention in all 4 extremities. No facial asymmetry. No pronator drift. Sensation to light touch intact in all 4 extremities. 4 out of 5 strength in the left lower extremity.  Skin: Skin is warm and dry.  Psychiatric: He has a normal mood and affect. His behavior is normal.  Nursing note and vitals reviewed.    ED Treatments / Results  Labs (all labs ordered are listed, but only abnormal results are displayed) Labs Reviewed  PROTIME-INR - Abnormal; Notable for the following:       Result Value   Prothrombin Time 15.3 (*)    All other components within normal limits  ETHANOL  APTT  CBC  DIFFERENTIAL  COMPREHENSIVE METABOLIC PANEL  RAPID URINE DRUG SCREEN, HOSP PERFORMED  URINALYSIS, ROUTINE W REFLEX MICROSCOPIC  I-STAT CHEM 8, ED  I-STAT TROPOININ, ED  CBG MONITORING, ED    EKG  EKG Interpretation None       Radiology Ct Head Wo Contrast  Result Date: 12/27/2016 CLINICAL DATA:  Headache for months. EXAM: CT HEAD WITHOUT CONTRAST TECHNIQUE: Contiguous axial images were obtained from the base of the skull through the vertex without intravenous contrast. COMPARISON:  CT scan of November 04, 2016. FINDINGS: Brain: Mild chronic ischemic white matter disease is noted. Minimal diffuse cortical atrophy is noted. No mass effect or midline shift is noted. Ventricular size is within normal limits. There is no evidence of mass lesion, hemorrhage or acute infarction. Vascular: Atherosclerosis of carotid siphons is noted. Skull:  Normal. Negative for fracture or focal lesion. Sinuses/Orbits: No acute finding. Other: None. IMPRESSION: Mild chronic ischemic white matter disease. Minimal diffuse cortical atrophy. No acute intracranial abnormality seen. Electronically Signed   By: Marijo Conception, M.D.   On: 12/27/2016 15:22    Procedures Procedures (including critical care time)  Medications Ordered in ED Medications  promethazine (PHENERGAN) injection 25 mg (25 mg Intravenous Given 12/27/16 1415)     Initial Impression / Assessment and Plan / ED Course  I have reviewed the triage vital signs and the nursing notes.  Pertinent labs & imaging results that were available during my care of the patient were reviewed by me and considered in my medical decision making (see chart for details).     Patient here for evaluation of headache, speech difficulties, weakness. He has history of similar symptoms in April. Current examination is not consistent with CVA. Question complicated migraine versus conversion disorder.   On repeat assessment in the emergency department at 3:45 PM patient states he is feeling improved and slept some but he still has persistent headache. Speech fluent and clear at that time. When queried where his wife while he began with recurrent stuttering speech. Patient care transferred to Dr. Dayna Barker pending MRI. Will treat her headache with the additional medications.  Final Clinical Impressions(s) / ED Diagnoses   Final diagnoses:  Headache    New Prescriptions New Prescriptions   No medications on file     Quintella Reichert, MD 12/27/16 1601

## 2016-12-27 NOTE — ED Triage Notes (Signed)
Today at 62, he was coming back from grocery store walking to car. His wife noticed his speech was messing up. Has appointment with psychiatrist today for bipolar d/o, where he was going to have rash checked to both his arms. His speech is pitchy, slowed, grunting, and his emotions are labile. This speech pattern is intermittent, resolves and his speech becomes normal. apparrently this happened before, and was told it was stress related, but wife cant recall the name.

## 2016-12-30 DIAGNOSIS — M791 Myalgia: Secondary | ICD-10-CM | POA: Diagnosis not present

## 2016-12-30 DIAGNOSIS — G4452 New daily persistent headache (NDPH): Secondary | ICD-10-CM | POA: Diagnosis not present

## 2016-12-30 DIAGNOSIS — M542 Cervicalgia: Secondary | ICD-10-CM | POA: Diagnosis not present

## 2016-12-30 DIAGNOSIS — R51 Headache: Secondary | ICD-10-CM | POA: Diagnosis not present

## 2016-12-30 DIAGNOSIS — G518 Other disorders of facial nerve: Secondary | ICD-10-CM | POA: Diagnosis not present

## 2016-12-31 DIAGNOSIS — Z6833 Body mass index (BMI) 33.0-33.9, adult: Secondary | ICD-10-CM | POA: Diagnosis not present

## 2016-12-31 DIAGNOSIS — I10 Essential (primary) hypertension: Secondary | ICD-10-CM | POA: Diagnosis not present

## 2016-12-31 DIAGNOSIS — E559 Vitamin D deficiency, unspecified: Secondary | ICD-10-CM | POA: Diagnosis not present

## 2016-12-31 DIAGNOSIS — F3162 Bipolar disorder, current episode mixed, moderate: Secondary | ICD-10-CM | POA: Diagnosis not present

## 2016-12-31 DIAGNOSIS — F329 Major depressive disorder, single episode, unspecified: Secondary | ICD-10-CM | POA: Diagnosis not present

## 2016-12-31 DIAGNOSIS — N183 Chronic kidney disease, stage 3 (moderate): Secondary | ICD-10-CM | POA: Diagnosis not present

## 2016-12-31 DIAGNOSIS — Z0001 Encounter for general adult medical examination with abnormal findings: Secondary | ICD-10-CM | POA: Diagnosis not present

## 2016-12-31 DIAGNOSIS — E782 Mixed hyperlipidemia: Secondary | ICD-10-CM | POA: Diagnosis not present

## 2016-12-31 DIAGNOSIS — I4891 Unspecified atrial fibrillation: Secondary | ICD-10-CM | POA: Diagnosis not present

## 2016-12-31 DIAGNOSIS — E538 Deficiency of other specified B group vitamins: Secondary | ICD-10-CM | POA: Diagnosis not present

## 2016-12-31 DIAGNOSIS — Z1389 Encounter for screening for other disorder: Secondary | ICD-10-CM | POA: Diagnosis not present

## 2017-01-01 DIAGNOSIS — M25511 Pain in right shoulder: Secondary | ICD-10-CM | POA: Diagnosis not present

## 2017-01-03 DIAGNOSIS — F319 Bipolar disorder, unspecified: Secondary | ICD-10-CM | POA: Diagnosis not present

## 2017-01-13 DIAGNOSIS — F319 Bipolar disorder, unspecified: Secondary | ICD-10-CM | POA: Diagnosis not present

## 2017-01-14 ENCOUNTER — Telehealth: Payer: Self-pay

## 2017-01-14 ENCOUNTER — Encounter: Payer: Self-pay | Admitting: Cardiovascular Disease

## 2017-01-14 NOTE — Telephone Encounter (Signed)
epicd 

## 2017-01-14 NOTE — Telephone Encounter (Signed)
1. Type of surgery: Right shoulder scope 2. Date of surgery: pending 3. Surgeon: Dr Marchia Bond 4. Medications that need to be held & how long: Xarelto 20 mg 5. Fax and/or Phone: (p) [phone] (f) [phone]

## 2017-01-16 DIAGNOSIS — M791 Myalgia: Secondary | ICD-10-CM | POA: Diagnosis not present

## 2017-01-16 DIAGNOSIS — G518 Other disorders of facial nerve: Secondary | ICD-10-CM | POA: Diagnosis not present

## 2017-01-16 DIAGNOSIS — M542 Cervicalgia: Secondary | ICD-10-CM | POA: Diagnosis not present

## 2017-01-16 DIAGNOSIS — R51 Headache: Secondary | ICD-10-CM | POA: Diagnosis not present

## 2017-01-16 DIAGNOSIS — G4452 New daily persistent headache (NDPH): Secondary | ICD-10-CM | POA: Diagnosis not present

## 2017-01-17 NOTE — Telephone Encounter (Signed)
Faxed letter to [employer] at [place] via epic.

## 2017-01-30 DIAGNOSIS — M542 Cervicalgia: Secondary | ICD-10-CM | POA: Diagnosis not present

## 2017-01-30 DIAGNOSIS — M48061 Spinal stenosis, lumbar region without neurogenic claudication: Secondary | ICD-10-CM | POA: Diagnosis not present

## 2017-01-30 DIAGNOSIS — M47816 Spondylosis without myelopathy or radiculopathy, lumbar region: Secondary | ICD-10-CM | POA: Diagnosis not present

## 2017-02-06 ENCOUNTER — Emergency Department (HOSPITAL_COMMUNITY): Payer: Medicare Other

## 2017-02-06 ENCOUNTER — Observation Stay (HOSPITAL_COMMUNITY)
Admission: EM | Admit: 2017-02-06 | Discharge: 2017-02-07 | Disposition: A | Discharge status: Home / Self Care | Payer: Medicare Other | Attending: Internal Medicine | Admitting: Internal Medicine

## 2017-02-06 DIAGNOSIS — M4802 Spinal stenosis, cervical region: Secondary | ICD-10-CM | POA: Insufficient documentation

## 2017-02-06 DIAGNOSIS — Z823 Family history of stroke: Secondary | ICD-10-CM | POA: Insufficient documentation

## 2017-02-06 DIAGNOSIS — M24111 Other articular cartilage disorders, right shoulder: Secondary | ICD-10-CM | POA: Diagnosis not present

## 2017-02-06 DIAGNOSIS — Z8249 Family history of ischemic heart disease and other diseases of the circulatory system: Secondary | ICD-10-CM | POA: Insufficient documentation

## 2017-02-06 DIAGNOSIS — Z9842 Cataract extraction status, left eye: Secondary | ICD-10-CM | POA: Diagnosis not present

## 2017-02-06 DIAGNOSIS — M479 Spondylosis, unspecified: Secondary | ICD-10-CM | POA: Diagnosis not present

## 2017-02-06 DIAGNOSIS — I4891 Unspecified atrial fibrillation: Secondary | ICD-10-CM | POA: Diagnosis not present

## 2017-02-06 DIAGNOSIS — J449 Chronic obstructive pulmonary disease, unspecified: Secondary | ICD-10-CM | POA: Diagnosis not present

## 2017-02-06 DIAGNOSIS — M1990 Unspecified osteoarthritis, unspecified site: Secondary | ICD-10-CM | POA: Insufficient documentation

## 2017-02-06 DIAGNOSIS — G8929 Other chronic pain: Secondary | ICD-10-CM | POA: Diagnosis not present

## 2017-02-06 DIAGNOSIS — G4733 Obstructive sleep apnea (adult) (pediatric): Secondary | ICD-10-CM | POA: Insufficient documentation

## 2017-02-06 DIAGNOSIS — Z86010 Personal history of colonic polyps: Secondary | ICD-10-CM | POA: Insufficient documentation

## 2017-02-06 DIAGNOSIS — M109 Gout, unspecified: Secondary | ICD-10-CM | POA: Insufficient documentation

## 2017-02-06 DIAGNOSIS — Z885 Allergy status to narcotic agent status: Secondary | ICD-10-CM | POA: Diagnosis not present

## 2017-02-06 DIAGNOSIS — F319 Bipolar disorder, unspecified: Secondary | ICD-10-CM | POA: Insufficient documentation

## 2017-02-06 DIAGNOSIS — Z833 Family history of diabetes mellitus: Secondary | ICD-10-CM | POA: Insufficient documentation

## 2017-02-06 DIAGNOSIS — Z6836 Body mass index (BMI) 36.0-36.9, adult: Secondary | ICD-10-CM | POA: Insufficient documentation

## 2017-02-06 DIAGNOSIS — K5730 Diverticulosis of large intestine without perforation or abscess without bleeding: Secondary | ICD-10-CM | POA: Insufficient documentation

## 2017-02-06 DIAGNOSIS — E039 Hypothyroidism, unspecified: Secondary | ICD-10-CM | POA: Insufficient documentation

## 2017-02-06 DIAGNOSIS — K648 Other hemorrhoids: Secondary | ICD-10-CM | POA: Insufficient documentation

## 2017-02-06 DIAGNOSIS — Z888 Allergy status to other drugs, medicaments and biological substances status: Secondary | ICD-10-CM | POA: Insufficient documentation

## 2017-02-06 DIAGNOSIS — M549 Dorsalgia, unspecified: Secondary | ICD-10-CM | POA: Diagnosis not present

## 2017-02-06 DIAGNOSIS — R471 Dysarthria and anarthria: Secondary | ICD-10-CM | POA: Diagnosis not present

## 2017-02-06 DIAGNOSIS — R4701 Aphasia: Principal | ICD-10-CM | POA: Insufficient documentation

## 2017-02-06 DIAGNOSIS — G8918 Other acute postprocedural pain: Secondary | ICD-10-CM | POA: Diagnosis not present

## 2017-02-06 DIAGNOSIS — R4781 Slurred speech: Secondary | ICD-10-CM | POA: Insufficient documentation

## 2017-02-06 DIAGNOSIS — S43431A Superior glenoid labrum lesion of right shoulder, initial encounter: Secondary | ICD-10-CM | POA: Diagnosis not present

## 2017-02-06 DIAGNOSIS — Z9841 Cataract extraction status, right eye: Secondary | ICD-10-CM | POA: Insufficient documentation

## 2017-02-06 DIAGNOSIS — R29810 Facial weakness: Secondary | ICD-10-CM | POA: Diagnosis not present

## 2017-02-06 DIAGNOSIS — Z7901 Long term (current) use of anticoagulants: Secondary | ICD-10-CM | POA: Insufficient documentation

## 2017-02-06 DIAGNOSIS — M7551 Bursitis of right shoulder: Secondary | ICD-10-CM | POA: Diagnosis not present

## 2017-02-06 DIAGNOSIS — R29818 Other symptoms and signs involving the nervous system: Secondary | ICD-10-CM | POA: Diagnosis not present

## 2017-02-06 DIAGNOSIS — R4702 Dysphasia: Secondary | ICD-10-CM

## 2017-02-06 DIAGNOSIS — I10 Essential (primary) hypertension: Secondary | ICD-10-CM | POA: Diagnosis not present

## 2017-02-06 DIAGNOSIS — M7501 Adhesive capsulitis of right shoulder: Secondary | ICD-10-CM | POA: Diagnosis not present

## 2017-02-06 DIAGNOSIS — M7541 Impingement syndrome of right shoulder: Secondary | ICD-10-CM | POA: Diagnosis not present

## 2017-02-06 DIAGNOSIS — Y999 Unspecified external cause status: Secondary | ICD-10-CM | POA: Diagnosis not present

## 2017-02-06 DIAGNOSIS — I639 Cerebral infarction, unspecified: Secondary | ICD-10-CM | POA: Diagnosis present

## 2017-02-06 DIAGNOSIS — E785 Hyperlipidemia, unspecified: Secondary | ICD-10-CM | POA: Diagnosis not present

## 2017-02-06 DIAGNOSIS — M19011 Primary osteoarthritis, right shoulder: Secondary | ICD-10-CM | POA: Diagnosis not present

## 2017-02-06 DIAGNOSIS — I63233 Cerebral infarction due to unspecified occlusion or stenosis of bilateral carotid arteries: Secondary | ICD-10-CM | POA: Diagnosis not present

## 2017-02-06 DIAGNOSIS — I6789 Other cerebrovascular disease: Secondary | ICD-10-CM | POA: Diagnosis not present

## 2017-02-06 DIAGNOSIS — Z9889 Other specified postprocedural states: Secondary | ICD-10-CM | POA: Insufficient documentation

## 2017-02-06 LAB — DIFFERENTIAL
BASOS ABS: 0 10*3/uL (ref 0.0–0.1)
BASOS PCT: 0 %
Eosinophils Absolute: 0 10*3/uL (ref 0.0–0.7)
Eosinophils Relative: 0 %
LYMPHS ABS: 1.4 10*3/uL (ref 0.7–4.0)
Lymphocytes Relative: 13 %
MONOS PCT: 7 %
Monocytes Absolute: 0.7 10*3/uL (ref 0.1–1.0)
NEUTROS ABS: 8.5 10*3/uL — AB (ref 1.7–7.7)
NEUTROS PCT: 80 %

## 2017-02-06 LAB — COMPREHENSIVE METABOLIC PANEL
ALBUMIN: 4.2 g/dL (ref 3.5–5.0)
ALT: 24 U/L (ref 17–63)
ANION GAP: 10 (ref 5–15)
AST: 32 U/L (ref 15–41)
Alkaline Phosphatase: 82 U/L (ref 38–126)
BUN: 14 mg/dL (ref 6–20)
CHLORIDE: 102 mmol/L (ref 101–111)
CO2: 25 mmol/L (ref 22–32)
Calcium: 9 mg/dL (ref 8.9–10.3)
Creatinine, Ser: 1.12 mg/dL (ref 0.61–1.24)
GFR calc Af Amer: >60 mL/min (ref >60)
GFR calc non Af Amer: >60 mL/min (ref >60)
GLUCOSE: 126 mg/dL — AB (ref 65–99)
POTASSIUM: 3.8 mmol/L (ref 3.5–5.1)
Sodium: 137 mmol/L (ref 135–145)
Total Bilirubin: 0.4 mg/dL (ref 0.3–1.2)
Total Protein: 7.1 g/dL (ref 6.5–8.1)

## 2017-02-06 LAB — I-STAT CHEM 8, ED
BUN: 17 mg/dL (ref 6–20)
CREATININE: 1.1 mg/dL (ref 0.61–1.24)
Calcium, Ion: 1.04 mmol/L — ABNORMAL LOW (ref 1.15–1.40)
Chloride: 101 mmol/L (ref 101–111)
Glucose, Bld: 124 mg/dL — ABNORMAL HIGH (ref 65–99)
HEMATOCRIT: 42 % (ref 39.0–52.0)
Hemoglobin: 14.3 g/dL (ref 13.0–17.0)
POTASSIUM: 3.8 mmol/L (ref 3.5–5.1)
SODIUM: 138 mmol/L (ref 135–145)
TCO2: 32 mmol/L (ref 0–100)

## 2017-02-06 LAB — I-STAT TROPONIN, ED: Troponin i, poc: 0.01 ng/mL (ref 0.00–0.08)

## 2017-02-06 LAB — CBC
HEMATOCRIT: 41.7 % (ref 39.0–52.0)
HEMOGLOBIN: 14.4 g/dL (ref 13.0–17.0)
MCH: 32.1 pg (ref 26.0–34.0)
MCHC: 34.5 g/dL (ref 30.0–36.0)
MCV: 93.1 fL (ref 78.0–100.0)
Platelets: 162 10*3/uL (ref 150–400)
RBC: 4.48 MIL/uL (ref 4.22–5.81)
RDW: 12.7 % (ref 11.5–15.5)
WBC: 10.6 10*3/uL — AB (ref 4.0–10.5)

## 2017-02-06 LAB — APTT: APTT: 31 s (ref 24–36)

## 2017-02-06 LAB — PROTIME-INR
INR: 0.93
Prothrombin Time: 12.5 seconds (ref 11.4–15.2)

## 2017-02-06 MED ORDER — IOPAMIDOL (ISOVUE-370) INJECTION 76%
INTRAVENOUS | Status: AC
  Filled 2017-02-06: qty 100

## 2017-02-06 NOTE — ED Provider Notes (Addendum)
[facility] DEPT Provider Note   CSN: [id number] Arrival date & time: 02/06/17  2304     History   Chief Complaint Chief Complaint  Patient presents with  . Code Stroke    HPI MATTHE [name] is a 71 y.o. male.  Patient brought to the ER for possible stroke. Patient just underwent right shoulder surgery. He does have a history of atrial fibrillation, anticoagulation was held for surgery. He began to suddenly complain of headache, neck pain and was noted to be very confused and having difficulty with speech. He is brought to the ER by ambulance. Patient agitated, confused and not answering questions fully at arrival. Level V Caveat due to acuity       Past Medical History:  Diagnosis Date  . Arthritis   . Atrial fibrillation ([place]) 01/12/2010   2D Echo EF=>55%  . Bipolar disorder ([place])   . Chronic back pain   . COPD (chronic obstructive pulmonary disease) ([place])   . Depression   . Dysrhythmia    AFib  . History of colonic polyps   . History of gout   . Hyperlipidemia   . Hypertension   . Hypothyroidism   . Morbid obesity ([place])   . OSA (obstructive sleep apnea) 10/24/2009   AHI was 27.62hr RDI was 34.3 hr REM 0.00hr; had CPAP years ago but no longer uses.  . Prostatitis   . Tubular adenoma of colon 01/2016    Patient Active Problem List   Diagnosis Date Noted  . Dysphasia 02/07/2017  . Mild obesity 05/10/2016  . History of colonic polyps   . Diverticulosis of colon without hemorrhage   . Hyperlipidemia LDL goal <130 05/16/2013  . KNEE PAIN 08/07/2009  . KNEE SPRAIN, ACUTE 08/07/2009  . COLONIC POLYPS, HX OF 05/10/2009  . HYPOTHYROIDISM 05/08/2009  . Bipolar disorder ([place]) 05/08/2009  . Essential hypertension 05/08/2009  . FIBRILLATION, ATRIAL 05/08/2009  . HEMATOCHEZIA 05/08/2009  . LOW BACK PAIN, CHRONIC 05/08/2009  . ABDOMINAL PAIN 05/08/2009    Past Surgical History:  Procedure Laterality Date  . APPENDECTOMY  1959  . asthma    . CATARACT  EXTRACTION Bilateral 1995  . COLONOSCOPY  2011   RMR: left-sided diverticula, minimal rectal friability. No polyps. Repeat 5 years.  . COLONOSCOPY WITH PROPOFOL N/A 01/15/2016   Dr.Rourk- diverticulosis in the entire examined colon, multiple rectal and colonic polyps, internal hemorrhoids. bx= tubular adenomas and hyperplastic polyps.   Marland Kitchen HAND / FINGER LESION EXCISION Right   . POLYPECTOMY  01/15/2016   Procedure: POLYPECTOMY;  Surgeon: Daneil Dolin, MD;  Location: [facility];  Service: Endoscopy;;  . TONSILLECTOMY  1955       Home Medications    Prior to Admission medications   Medication Sig Start Date End Date Taking? Authorizing Provider  ALPRAZolam Duanne Moron) 0.5 MG tablet Take 0.5 mg by mouth 2 (two) times daily as needed for anxiety.     [provider]  cholecalciferol (VITAMIN D) 1000 UNITS tablet Take 2,000 Units by mouth daily.    [provider]  citalopram (CELEXA) 20 MG tablet Take 1 tablet by mouth at bedtime.  11/24/13   [provider]  docusate sodium (COLACE) 50 MG capsule Take 50 mg by mouth 2 (two) times daily.    [provider]  gabapentin (NEURONTIN) 100 MG capsule Take 100 mg by mouth 2 (two) times daily as needed (pain).  12/12/16   [provider]  gabapentin (NEURONTIN) 300 MG capsule Take  300 mg by mouth at bedtime. 12/12/16   [provider]  hydrochlorothiazide (HYDRODIURIL) 12.5 MG tablet Take 1 tablet (12.5 mg total) by mouth daily. 05/09/16   Croitoru, Mihai, MD  Lactobacillus (DIGESTIVE HEALTH PROBIOTIC) CAPS Take 1 tablet by mouth daily.    [provider]  lamoTRIgine (LAMICTAL) 150 MG tablet Take 150 mg by mouth at bedtime.    [provider]  levothyroxine (SYNTHROID, LEVOTHROID) 25 MCG tablet Take 25 mcg by mouth at bedtime.  04/06/15   [provider]  oxcarbazepine (TRILEPTAL) 600 MG tablet Take 300-1,200 mg by mouth See admin instructions. Take 1/2 tablet (300 mg) by mouth  every morning and 2 tablets (1200 mg) at bedtime    [provider]  pantoprazole (PROTONIX) 40 MG tablet Take 1 tablet (40 mg total) by mouth daily. 03/05/16   Rourk, Cristopher Estimable, MD  rivaroxaban (XARELTO) 20 MG TABS tablet Take 20 mg by mouth daily after supper.     [provider]  traMADol (ULTRAM) 50 MG tablet Take 50 mg by mouth 3 (three) times daily as needed for moderate pain.    [provider]    Family History Family History  Problem Relation Age of Onset  . Diabetes Mother   . Heart failure Mother   . Hypertension Mother   . Hypertension Father   . Cancer Paternal Grandmother   . Stroke Sister   . Colon cancer Neg Hx     Social History Social History  Substance Use Topics  . Smoking status: Never Smoker  . Smokeless tobacco: Never Used  . Alcohol use No     Allergies   Amlodipine and Vicodin [hydrocodone-acetaminophen]   Review of Systems Review of Systems  Unable to perform ROS: Acuity of condition     Physical Exam Updated Vital Signs Ht 5\' 11"  (1.803 m)   Wt 118 kg (260 lb 3 oz)   BMI 36.29 kg/m   Physical Exam  Constitutional: He appears well-developed and well-nourished. No distress.  HENT:  Head: Normocephalic and atraumatic.  Right Ear: Hearing normal.  Left Ear: Hearing normal.  Nose: Nose normal.  Mouth/Throat: Oropharynx is clear and moist and mucous membranes are normal.  Eyes: Pupils are equal, round, and reactive to light. Conjunctivae and EOM are normal.  Neck: Normal range of motion. Neck supple.  Cardiovascular: S1 normal and S2 normal.  An irregularly irregular rhythm present. Exam reveals no gallop and no friction rub.   No murmur heard. Pulmonary/Chest: Effort normal and breath sounds normal. No respiratory distress. He exhibits no tenderness.  Abdominal: Soft. Normal appearance and bowel sounds are normal. There is no hepatosplenomegaly. There is no tenderness. There is no rebound, no guarding, no  tenderness at McBurney's point and negative Murphy's sign. No hernia.  Musculoskeletal:       Right shoulder: He exhibits tenderness.  Neurological: He is alert. He has normal strength. No cranial nerve deficit or sensory deficit. Coordination normal. GCS eye subscore is 4. GCS verbal subscore is 4. GCS motor subscore is 6.  Skin: Skin is warm and dry. No rash noted. No cyanosis.  Surgical site intact R shoulder, no drainage or signs of infection  Psychiatric: His mood appears anxious. He is agitated. He is noncommunicative.  Nursing note and vitals reviewed.    ED Treatments / Results  Labs (all labs ordered are listed, but only abnormal results are displayed) Labs Reviewed  CBC - Abnormal; Notable for the following:  Result Value   WBC 10.6 (*)    All other components within normal limits  DIFFERENTIAL - Abnormal; Notable for the following:    Neutro Abs 8.5 (*)    All other components within normal limits  COMPREHENSIVE METABOLIC PANEL - Abnormal; Notable for the following:    Glucose, Bld 126 (*)    All other components within normal limits  CBG MONITORING, ED - Abnormal; Notable for the following:    Glucose-Capillary 126 (*)    All other components within normal limits  I-STAT CHEM 8, ED - Abnormal; Notable for the following:    Glucose, Bld 124 (*)    Calcium, Ion 1.04 (*)    All other components within normal limits  PROTIME-INR  APTT  I-STAT TROPONIN, ED    EKG  EKG Interpretation None       Radiology Ct Angio Head W Or Wo Contrast  Result Date: 02/07/2017 CLINICAL DATA:  71 y/o  M; acute stroke. EXAM: CT ANGIOGRAPHY HEAD AND NECK CT PERFUSION BRAIN TECHNIQUE: Multidetector CT imaging of the head and neck was performed using the standard protocol during bolus administration of intravenous contrast. Multiplanar CT image reconstructions and MIPs were obtained to evaluate the vascular anatomy. Carotid stenosis measurements (when applicable) are obtained  utilizing NASCET criteria, using the distal internal carotid diameter as the denominator. Multiphase CT imaging of the brain was performed following IV bolus contrast injection. Subsequent parametric perfusion maps were calculated using RAPID software. CONTRAST:  40 cc Isovue 370 CT perfusion. 50 cc Isovue 370 CT angiogram head and neck. COMPARISON:  02/06/2017 CT of the head.  12/27/2016 MRA of the head. FINDINGS: CTA NECK FINDINGS Aortic arch: Standard branching. Imaged portion shows no evidence of aneurysm or dissection. No significant stenosis of the major arch vessel origins. Right carotid system: No evidence of dissection, stenosis (50% or greater) or occlusion. Mild calcified plaque of the carotid bifurcation without significant stenosis. Left carotid system: No evidence of dissection, stenosis (50% or greater) or occlusion. Mild calcified plaque of the carotid bifurcation without significant stenosis. Vertebral arteries: Codominant. No evidence of dissection, stenosis (50% or greater) or occlusion. Skeleton: Cervical spondylosis with mild-to-moderate discogenic degenerative changes greatest at the C5-6 level where there are prominent marginal osteophytes. Calcified disc bulges result in canal stenosis greatest at the C3-4 level where it is mild-to-moderate. Uncovertebral and facet hypertrophy without high-grade bony foraminal narrowing. Other neck: Negative. Upper chest: Negative. Review of the MIP images confirms the above findings CTA HEAD FINDINGS Anterior circulation: No significant stenosis, proximal occlusion, aneurysm, or vascular malformation. Mild non stenotic calcified plaque of the carotid siphons. Posterior circulation: No significant stenosis, proximal occlusion, aneurysm, or vascular malformation. Minimal non stenotic calcified plaque of the left V4 segment. Venous sinuses: As permitted by contrast timing, patent. Anatomic variants: Patent anterior communicating artery. No posterior  communicating artery is identified, likely hypoplastic or absent. Delayed phase: No abnormal intracranial enhancement. Review of the MIP images confirms the above findings CT Brain Perfusion Findings: CBF (<30%) Volume: 33mL Perfusion (Tmax>6.0s) volume: 38mL Mismatch Volume: 5mL Infarction Location:Negative. IMPRESSION: 1. Patent carotid and vertebral arteries. No dissection, aneurysm, or significant stenosis is identified. 2. Patent circle of Willis. No large vessel occlusion, aneurysm, or significant stenosis is identified. 3. Mild calcified atherosclerotic plaque of carotid bifurcations and carotid siphons. 4. Negative CT brain perfusion. These results were called by telephone at the time of interpretation on 02/06/2017 at 11:55 pm to Dr. Kerney Elbe , who verbally acknowledged these results.  Electronically Signed   By: Kristine Garbe M.D.   On: 02/07/2017 00:03   Ct Angio Neck W Or Wo Contrast  Result Date: 02/07/2017 CLINICAL DATA:  71 y/o  M; acute stroke. EXAM: CT ANGIOGRAPHY HEAD AND NECK CT PERFUSION BRAIN TECHNIQUE: Multidetector CT imaging of the head and neck was performed using the standard protocol during bolus administration of intravenous contrast. Multiplanar CT image reconstructions and MIPs were obtained to evaluate the vascular anatomy. Carotid stenosis measurements (when applicable) are obtained utilizing NASCET criteria, using the distal internal carotid diameter as the denominator. Multiphase CT imaging of the brain was performed following IV bolus contrast injection. Subsequent parametric perfusion maps were calculated using RAPID software. CONTRAST:  40 cc Isovue 370 CT perfusion. 50 cc Isovue 370 CT angiogram head and neck. COMPARISON:  02/06/2017 CT of the head.  12/27/2016 MRA of the head. FINDINGS: CTA NECK FINDINGS Aortic arch: Standard branching. Imaged portion shows no evidence of aneurysm or dissection. No significant stenosis of the major arch vessel origins. Right  carotid system: No evidence of dissection, stenosis (50% or greater) or occlusion. Mild calcified plaque of the carotid bifurcation without significant stenosis. Left carotid system: No evidence of dissection, stenosis (50% or greater) or occlusion. Mild calcified plaque of the carotid bifurcation without significant stenosis. Vertebral arteries: Codominant. No evidence of dissection, stenosis (50% or greater) or occlusion. Skeleton: Cervical spondylosis with mild-to-moderate discogenic degenerative changes greatest at the C5-6 level where there are prominent marginal osteophytes. Calcified disc bulges result in canal stenosis greatest at the C3-4 level where it is mild-to-moderate. Uncovertebral and facet hypertrophy without high-grade bony foraminal narrowing. Other neck: Negative. Upper chest: Negative. Review of the MIP images confirms the above findings CTA HEAD FINDINGS Anterior circulation: No significant stenosis, proximal occlusion, aneurysm, or vascular malformation. Mild non stenotic calcified plaque of the carotid siphons. Posterior circulation: No significant stenosis, proximal occlusion, aneurysm, or vascular malformation. Minimal non stenotic calcified plaque of the left V4 segment. Venous sinuses: As permitted by contrast timing, patent. Anatomic variants: Patent anterior communicating artery. No posterior communicating artery is identified, likely hypoplastic or absent. Delayed phase: No abnormal intracranial enhancement. Review of the MIP images confirms the above findings CT Brain Perfusion Findings: CBF (<30%) Volume: 45mL Perfusion (Tmax>6.0s) volume: 51mL Mismatch Volume: 50mL Infarction Location:Negative. IMPRESSION: 1. Patent carotid and vertebral arteries. No dissection, aneurysm, or significant stenosis is identified. 2. Patent circle of Willis. No large vessel occlusion, aneurysm, or significant stenosis is identified. 3. Mild calcified atherosclerotic plaque of carotid bifurcations and  carotid siphons. 4. Negative CT brain perfusion. These results were called by telephone at the time of interpretation on 02/06/2017 at 11:55 pm to Dr. Kerney Elbe , who verbally acknowledged these results. Electronically Signed   By: Kristine Garbe M.D.   On: 02/07/2017 00:03   Ct Cerebral Perfusion W Contrast  Result Date: 02/07/2017 CLINICAL DATA:  71 y/o  M; acute stroke. EXAM: CT ANGIOGRAPHY HEAD AND NECK CT PERFUSION BRAIN TECHNIQUE: Multidetector CT imaging of the head and neck was performed using the standard protocol during bolus administration of intravenous contrast. Multiplanar CT image reconstructions and MIPs were obtained to evaluate the vascular anatomy. Carotid stenosis measurements (when applicable) are obtained utilizing NASCET criteria, using the distal internal carotid diameter as the denominator. Multiphase CT imaging of the brain was performed following IV bolus contrast injection. Subsequent parametric perfusion maps were calculated using RAPID software. CONTRAST:  40 cc Isovue 370 CT perfusion. 50 cc Isovue 370 CT angiogram  head and neck. COMPARISON:  02/06/2017 CT of the head.  12/27/2016 MRA of the head. FINDINGS: CTA NECK FINDINGS Aortic arch: Standard branching. Imaged portion shows no evidence of aneurysm or dissection. No significant stenosis of the major arch vessel origins. Right carotid system: No evidence of dissection, stenosis (50% or greater) or occlusion. Mild calcified plaque of the carotid bifurcation without significant stenosis. Left carotid system: No evidence of dissection, stenosis (50% or greater) or occlusion. Mild calcified plaque of the carotid bifurcation without significant stenosis. Vertebral arteries: Codominant. No evidence of dissection, stenosis (50% or greater) or occlusion. Skeleton: Cervical spondylosis with mild-to-moderate discogenic degenerative changes greatest at the C5-6 level where there are prominent marginal osteophytes. Calcified disc  bulges result in canal stenosis greatest at the C3-4 level where it is mild-to-moderate. Uncovertebral and facet hypertrophy without high-grade bony foraminal narrowing. Other neck: Negative. Upper chest: Negative. Review of the MIP images confirms the above findings CTA HEAD FINDINGS Anterior circulation: No significant stenosis, proximal occlusion, aneurysm, or vascular malformation. Mild non stenotic calcified plaque of the carotid siphons. Posterior circulation: No significant stenosis, proximal occlusion, aneurysm, or vascular malformation. Minimal non stenotic calcified plaque of the left V4 segment. Venous sinuses: As permitted by contrast timing, patent. Anatomic variants: Patent anterior communicating artery. No posterior communicating artery is identified, likely hypoplastic or absent. Delayed phase: No abnormal intracranial enhancement. Review of the MIP images confirms the above findings CT Brain Perfusion Findings: CBF (<30%) Volume: 62mL Perfusion (Tmax>6.0s) volume: 27mL Mismatch Volume: 30mL Infarction Location:Negative. IMPRESSION: 1. Patent carotid and vertebral arteries. No dissection, aneurysm, or significant stenosis is identified. 2. Patent circle of Willis. No large vessel occlusion, aneurysm, or significant stenosis is identified. 3. Mild calcified atherosclerotic plaque of carotid bifurcations and carotid siphons. 4. Negative CT brain perfusion. These results were called by telephone at the time of interpretation on 02/06/2017 at 11:55 pm to Dr. Kerney Elbe , who verbally acknowledged these results. Electronically Signed   By: Kristine Garbe M.D.   On: 02/07/2017 00:03   Ct Head Code Stroke W/o Cm  Result Date: 02/06/2017 CLINICAL DATA:  Code stroke.  71 y/o  M; slurred speech. EXAM: CT HEAD WITHOUT CONTRAST TECHNIQUE: Contiguous axial images were obtained from the base of the skull through the vertex without intravenous contrast. COMPARISON:  12/27/2016 MRI of the head.   11/04/2016 CT of the head. FINDINGS: Brain: No evidence of acute infarction, hemorrhage, hydrocephalus, extra-axial collection or mass lesion/mass effect. Stable background of mild chronic microvascular ischemic changes and parenchymal volume loss of the brain. Stable small right paramedian retrocerebellar prominent extra-axial space, probably arachnoid cyst, without significant mass effect. Vascular: No hyperdense vessel identified. Calcific atherosclerosis of carotid siphons. Skull: Normal. Negative for fracture or focal lesion. Sinuses/Orbits: No acute finding. Other: None. ASPECTS [facility] Stroke Program Early CT Score) - Ganglionic level infarction (caudate, lentiform nuclei, internal capsule, insula, M1-M3 cortex): 7 - Supraganglionic infarction (M4-M6 cortex): 3 Total score (0-10 with 10 being normal): 10 IMPRESSION: 1. No acute intracranial abnormality identified. 2. ASPECTS is 10 3. Stable mild chronic microvascular ischemic changes and mild parenchymal volume loss of the brain. These results were called by telephone at the time of interpretation on 02/06/2017 at 11:34 pm to Dr. Betsey Holiday, who verbally acknowledged these results. Electronically Signed   By: Kristine Garbe M.D.   On: 02/06/2017 23:40    Procedures Procedures (including critical care time)  Medications Ordered in ED Medications  iopamidol (ISOVUE-370) 76 % injection (not administered)  HYDROmorphone (  DILAUDID) injection 0.5 mg (0.5 mg Intravenous Given 02/07/17 0040)  ondansetron (ZOFRAN) injection 4 mg (4 mg Intravenous Given 02/07/17 0041)     Initial Impression / Assessment and Plan / ED Course  I have reviewed the triage vital signs and the nursing notes.  Pertinent labs & imaging results that were available during my care of the patient were reviewed by me and considered in my medical decision making (see chart for details).     Patient presents to the ER as a code stroke. He had noted slurred speech,  stuttering, inability to communicate prior to arrival. He was evaluated by neurology. Imaging has been negative. It is felt that this is not a stroke. Patient has had 2 previous evaluations in the hospital with similar neurologic symptoms, felt to possibly be conversion in nature. Patient is still, however, unable to communicate appropriately. Neurology recommends observation overnight with pain control and further monitoring, but does not require any further neurologic workup.  Discussed admission with patient. Wife does not wish for him to be admitted and he agrees. He has had this workup several times in the past and it has been related to stress. They're concerned about the expense of an observation stay. He has been cleared from a neurologic standpoint, and therefore does not require hospitalization. Hospitalization was offered more for the patient and wife's convenience because of his speech difficulty. Since they are comfortable going home, I feel that it is appropriate and he will be discharged.  Final Clinical Impressions(s) / ED Diagnoses   Final diagnoses:  Dysarthria    New Prescriptions New Prescriptions   No medications on file     Orpah Greek, MD 02/07/17 0028    Orpah Greek, MD 02/07/17 0120

## 2017-02-06 NOTE — ED Triage Notes (Signed)
Pt arrived via [facility] with symptoms of slurred speech and complaints of right sided head and neck pain.  Pt had surgery at 0836 this am at [facility] and was there when symptoms was discovered by pt's nurse.  PTA cbg [id number]

## 2017-02-06 NOTE — Consult Note (Signed)
Referring Physician: Dr. Betsey Holiday    Chief Complaint: Slurred speech  HPI: Casey [name] is an 71 y.o. male who presents with acute onset of severe right shoulder and neck pain after right shoulder surgery today, evolving to garbled/slurred exclamations and fragmentary speech alternating with mutism. He has been seen previously by our service. He has a prior visit to Dr. Leonie Man on 11/07/16 for conversion disorder with speech symptoms, acute episode, with psychological stressor.   In CT he exclaims in pain periodically, with loud, exclamatory, labored speech output with prominent emotive and distorted prosodic component, that is unintelligible and does not fit any of the usually seen patterns of a lesional speech deficit.   Complicating the picture of conversion disorder versus acute stroke is his history of atrial fibrillation. He was recently taken off of his anticoagulant (rivaroxaban) for the surgical procedure.   PMHx also includes bipolar disorder, depression, chronic back pain, COPD, HLD, HTN, hypothyroidism, OSA and morbid obesity.  LSN: [id number] at surgical center tPA Given: No: Presentation most consistent with conversion disorder. No LVO or perfusion deficit on imaging.   Past Medical History:  Diagnosis Date  . Arthritis   . Atrial fibrillation ([place]) 01/12/2010   2D Echo EF=>55%  . Bipolar disorder ([place])   . Chronic back pain   . COPD (chronic obstructive pulmonary disease) ([place])   . Depression   . Dysrhythmia    AFib  . History of colonic polyps   . History of gout   . Hyperlipidemia   . Hypertension   . Hypothyroidism   . Morbid obesity ([place])   . OSA (obstructive sleep apnea) 10/24/2009   AHI was 27.62hr RDI was 34.3 hr REM 0.00hr; had CPAP years ago but no longer uses.  . Prostatitis   . Tubular adenoma of colon 01/2016    Past Surgical History:  Procedure Laterality Date  . APPENDECTOMY  1959  . asthma    . CATARACT EXTRACTION Bilateral 1995  . COLONOSCOPY  2011   RMR: left-sided diverticula, minimal rectal friability. No polyps. Repeat 5 years.  . COLONOSCOPY WITH PROPOFOL N/A 01/15/2016   Dr.Rourk- diverticulosis in the entire examined colon, multiple rectal and colonic polyps, internal hemorrhoids. bx= tubular adenomas and hyperplastic polyps.   Marland Kitchen HAND / FINGER LESION EXCISION Right   . POLYPECTOMY  01/15/2016   Procedure: POLYPECTOMY;  Surgeon: Daneil Dolin, MD;  Location: [facility];  Service: Endoscopy;;  . TONSILLECTOMY  1955    Family History  Problem Relation Age of Onset  . Diabetes Mother   . Heart failure Mother   . Hypertension Mother   . Hypertension Father   . Cancer Paternal Grandmother   . Stroke Sister   . Colon cancer Neg Hx    Social History:  reports that he has never smoked. He has never used smokeless tobacco. He reports that he does not drink alcohol or use drugs.  Allergies:  Allergies  Allergen Reactions  . Amlodipine Other (See Comments)    Stopped due to kidney issues  . Vicodin [Hydrocodone-Acetaminophen] Other (See Comments)    Patient is unsure of reaction    Home Medications:    ROS: Unable to obtain due to garbled speech.   Physical Examination: Height 5\' 11"  (1.803 m), weight 119.3 kg (263 lb).  HEENT: [place]/AT Lungs: Tachypneic Ext: Right shoulder dressing. Distal limbs are warm and well perfused.   Neurologic Examination: Mental Status: Awake and alert. Exclaims in pain periodically, with loud, exclamatory, labored speech  output with prominent emotive and distorted prosodic component, that is unintelligible and does not fit any of the usually seen patterns of a lesional speech deficit. Follows simple motor commands intermittently. Laborious, slow responses to yes/no questions.  Cranial Nerves: II:  Visual fields: Points in each direction to visual stimuli in right/left periphery. PERRL.  III,IV, VI: ptosis not present, EOMI without nystagmus V,VII: Grimace symmetric, points to left and right with  double simultaneous stimulation  VIII: hearing intact to commands IX,X: Dysarthria fitting a pattern most consistent with embellishment XI: Head is at midline XII: midline tongue extension after coaching by examiner Motor: RUE: Grip 4/5. Deferred remainder of RUE motor testing due to shoulder pain LUE: 5/5 with bizarre flailing movements when asked to perform motor functions - does not appear consistent with hemiballismus or dyskinesia RLE and LLE: Raises each leg antigravity with coaching; no asymmetry Sensory: Points to left and right with double simultaneous stimulation of legs and arms Deep Tendon Reflexes: Deferred due to pain Plantars: Right: downgoing  Left: downgoing Cerebellar: LUE without ataxia on limited testing of FNF - bizarre flailing movements when asked to perform FNF - does not appear consistent with hemiballismus or dyskinesia. Unable to test on right due to shoulder pain. Gait: Deferred  Results for orders placed or performed during the hospital encounter of 02/06/17 (from the past 48 hour(s))  CBC     Status: Abnormal   Collection Time: 02/06/17 11:06 PM  Result Value Ref Range   WBC 10.6 (H) 4.0 - 10.5 K/uL   RBC 4.48 4.22 - 5.81 MIL/uL   Hemoglobin 14.4 13.0 - 17.0 g/dL   HCT 41.7 39.0 - 52.0 %   MCV 93.1 78.0 - 100.0 fL   MCH 32.1 26.0 - 34.0 pg   MCHC 34.5 30.0 - 36.0 g/dL   RDW 12.7 11.5 - 15.5 %   Platelets 162 150 - 400 K/uL  Differential     Status: Abnormal   Collection Time: 02/06/17 11:06 PM  Result Value Ref Range   Neutrophils Relative % 80 %   Neutro Abs 8.5 (H) 1.7 - 7.7 K/uL   Lymphocytes Relative 13 %   Lymphs Abs 1.4 0.7 - 4.0 K/uL   Monocytes Relative 7 %   Monocytes Absolute 0.7 0.1 - 1.0 K/uL   Eosinophils Relative 0 %   Eosinophils Absolute 0.0 0.0 - 0.7 K/uL   Basophils Relative 0 %   Basophils Absolute 0.0 0.0 - 0.1 K/uL  I-Stat Chem 8, ED     Status: Abnormal   Collection Time: 02/06/17 11:11 PM  Result Value Ref Range   Sodium  138 135 - 145 mmol/L   Potassium 3.8 3.5 - 5.1 mmol/L   Chloride 101 101 - 111 mmol/L   BUN 17 6 - 20 mg/dL   Creatinine, Ser 1.10 0.61 - 1.24 mg/dL   Glucose, Bld 124 (H) 65 - 99 mg/dL   Calcium, Ion 1.04 (L) 1.15 - 1.40 mmol/L   TCO2 32 0 - 100 mmol/L   Hemoglobin 14.3 13.0 - 17.0 g/dL   HCT 42.0 39.0 - 52.0 %   No results found.  Assessment: 71 y.o. male with acute onset of severe right shoulder and neck pain after right shoulder surgery today, evolving to garbled/slurred exclamations and fragmentary speech alternating with mutism. He has been seen previously by our service. He has a prior visit to Dr. Leonie Man on 11/07/16 for conversion disorder with speech symptoms, acute episode, with psychological stressor 1. Current presentation  and exam findings appear most consistent with conversion or factitious disorder for secondary gain 2. CT head negative for acute abnormality 3. CTA of head and neck negative for LVO 4. CTP negative for perfusion deficit 5. Atrial fibrillation. Anticoagulation was held for surgical procedure and will need to be restarted as soon as possible.  Plan: 1. Presentation most consistent with conversion disorder. No LVO or perfusion deficit on imaging.  2. Will need to contact his surgeon regarding when his anticoagulant can be restarted from a surgical standpoint. It should be restarted as soon as possible 3. Pain management  @Electronically  signed: Dr. Kerney Elbe  02/06/2017, 11:29 PM

## 2017-02-07 DIAGNOSIS — R4701 Aphasia: Secondary | ICD-10-CM | POA: Diagnosis not present

## 2017-02-07 DIAGNOSIS — R4702 Dysphasia: Secondary | ICD-10-CM | POA: Diagnosis present

## 2017-02-07 LAB — CBG MONITORING, ED: Glucose-Capillary: 126 mg/dL — ABNORMAL HIGH (ref 65–99)

## 2017-02-07 MED ORDER — HYDROMORPHONE HCL 1 MG/ML IJ SOLN
0.5000 mg | Freq: Once | INTRAMUSCULAR | Status: AC
  Administered 2017-02-07: 0.5 mg via INTRAVENOUS
  Filled 2017-02-07: qty 1

## 2017-02-07 MED ORDER — ONDANSETRON HCL 4 MG/2ML IJ SOLN
4.0000 mg | Freq: Once | INTRAMUSCULAR | Status: AC
  Administered 2017-02-07: 4 mg via INTRAVENOUS
  Filled 2017-02-07: qty 2

## 2017-02-07 NOTE — ED Notes (Signed)
Patient Alert and oriented X4. Stable and ambulatory. Patient verbalized understanding of the discharge instructions.  Patient belongings were taken by the patient.  

## 2017-02-07 NOTE — Code Documentation (Signed)
Responded to Code Stroke called at 2111.  Pt arrived from surgery center at 2303.  Pt had R shoulder surgery this morning and was in bathroom when he developed slurred speech and right neck and head pain.  [id number]. [id number]-105. NIH-6 for inability to answer LOC questions, severe aphasia, and dysarthria.  CT, CTA, and CTP all negative.  Code stroke cancelled at 2359. Plan to admit to medical team.

## 2017-02-11 DIAGNOSIS — Z6834 Body mass index (BMI) 34.0-34.9, adult: Secondary | ICD-10-CM | POA: Diagnosis not present

## 2017-02-11 DIAGNOSIS — E6609 Other obesity due to excess calories: Secondary | ICD-10-CM | POA: Diagnosis not present

## 2017-02-11 DIAGNOSIS — F3162 Bipolar disorder, current episode mixed, moderate: Secondary | ICD-10-CM | POA: Diagnosis not present

## 2017-02-14 ENCOUNTER — Ambulatory Visit (INDEPENDENT_AMBULATORY_CARE_PROVIDER_SITE_OTHER): Payer: Medicare Other | Admitting: Urology

## 2017-02-14 ENCOUNTER — Other Ambulatory Visit (HOSPITAL_COMMUNITY)
Admission: RE | Admit: 2017-02-14 | Discharge: 2017-02-14 | Disposition: A | Discharge status: Home / Self Care | Payer: Medicare Other | Source: Other Acute Inpatient Hospital | Attending: Urology | Admitting: Urology

## 2017-02-14 DIAGNOSIS — N5201 Erectile dysfunction due to arterial insufficiency: Secondary | ICD-10-CM | POA: Diagnosis not present

## 2017-02-14 DIAGNOSIS — R972 Elevated prostate specific antigen [PSA]: Secondary | ICD-10-CM

## 2017-02-14 DIAGNOSIS — N359 Urethral stricture, unspecified: Secondary | ICD-10-CM | POA: Diagnosis not present

## 2017-02-14 LAB — URINALYSIS, COMPLETE (UACMP) WITH MICROSCOPIC
BACTERIA UA: NONE SEEN
BILIRUBIN URINE: NEGATIVE
Glucose, UA: NEGATIVE mg/dL
HGB URINE DIPSTICK: NEGATIVE
KETONES UR: NEGATIVE mg/dL
NITRITE: NEGATIVE
PROTEIN: NEGATIVE mg/dL
SPECIFIC GRAVITY, URINE: 1.012 (ref 1.005–1.030)
pH: 5 (ref 5.0–8.0)

## 2017-02-16 LAB — URINE CULTURE: Culture: <10000 — AB

## 2017-02-18 ENCOUNTER — Other Ambulatory Visit: Payer: Self-pay | Admitting: Neurology

## 2017-02-18 ENCOUNTER — Other Ambulatory Visit (INDEPENDENT_AMBULATORY_CARE_PROVIDER_SITE_OTHER): Payer: Medicare Other

## 2017-02-18 ENCOUNTER — Telehealth: Payer: Self-pay

## 2017-02-18 DIAGNOSIS — R4701 Aphasia: Secondary | ICD-10-CM

## 2017-02-18 DIAGNOSIS — R4702 Dysphasia: Secondary | ICD-10-CM

## 2017-02-18 NOTE — Telephone Encounter (Signed)
RN was contacted by Kathlee Nations at front desk about 0821am. Kathlee Nations stated pt wanted to schedule with Dr. Leonie Man. Patient stated he was having blurred vision,right eye blood red, am and neck hurts. Pt was accompany by no one and was by himself. RN was in the room with a patient and had to leave. Rn went to front desk to see patient. PT stated he was having blurred vision, and neck was hurting. Patient was confused at times during the conversation. He drove to the office by himself. Rn suggested pt seek the ED. Pt stated ' I dont want to seek the ED again, I was just there last week. Pts speech was garbled at times, and than clear. He was real anxious at the front desk. Pt was seen in the ED in June, and July for code stoke and everything was negative. Pt was seen in the ED for the same symptoms today. Rn contacted Jinny Blossom NP, and Occupational psychologist.for assistance.

## 2017-02-18 NOTE — Telephone Encounter (Signed)
I spoke to the patient has he was checking in. The patient's speech was dysarthric and he was complaining of blurry vision. The patient was asking for an appointment for EEG as he was instructed that he needed this from the Emergency  department. I advised patient that Dr. Leonie Man was out of the office and did not have any openings until the end of the month. Advised patient that if he was having new symptoms he should go to the emergency room. Patient refused. I did call his wife who is on his DPR form. She states that these symptoms are not new and have been reoccurring in the past. She advised that I tell the patient to sit down and play with his phone and his symptoms would resolve. I did discuss this with Dr. Erlinda Hong. He states if possible we can do an EEG in the office today and he can follow up with Dr. Leonie Man at the end of the month. The patient did have his EEG today. His wife did come and pick him up.

## 2017-02-18 NOTE — Telephone Encounter (Signed)
Patient had EEG today, will get Dr. Leonie Man to review.

## 2017-02-19 ENCOUNTER — Other Ambulatory Visit: Payer: Self-pay

## 2017-02-19 ENCOUNTER — Other Ambulatory Visit: Payer: Medicare Other

## 2017-02-20 DIAGNOSIS — G518 Other disorders of facial nerve: Secondary | ICD-10-CM | POA: Diagnosis not present

## 2017-02-20 DIAGNOSIS — M791 Myalgia: Secondary | ICD-10-CM | POA: Diagnosis not present

## 2017-02-20 DIAGNOSIS — R51 Headache: Secondary | ICD-10-CM | POA: Diagnosis not present

## 2017-02-20 DIAGNOSIS — G4452 New daily persistent headache (NDPH): Secondary | ICD-10-CM | POA: Diagnosis not present

## 2017-02-20 DIAGNOSIS — M542 Cervicalgia: Secondary | ICD-10-CM | POA: Diagnosis not present

## 2017-02-24 DIAGNOSIS — M25511 Pain in right shoulder: Secondary | ICD-10-CM | POA: Diagnosis not present

## 2017-02-24 DIAGNOSIS — M7551 Bursitis of right shoulder: Secondary | ICD-10-CM | POA: Diagnosis not present

## 2017-02-26 DIAGNOSIS — M25511 Pain in right shoulder: Secondary | ICD-10-CM | POA: Diagnosis not present

## 2017-02-26 DIAGNOSIS — M6281 Muscle weakness (generalized): Secondary | ICD-10-CM | POA: Diagnosis not present

## 2017-02-26 DIAGNOSIS — M25611 Stiffness of right shoulder, not elsewhere classified: Secondary | ICD-10-CM | POA: Diagnosis not present

## 2017-02-26 DIAGNOSIS — M19011 Primary osteoarthritis, right shoulder: Secondary | ICD-10-CM | POA: Diagnosis not present

## 2017-02-27 ENCOUNTER — Telehealth: Payer: Self-pay | Admitting: *Deleted

## 2017-02-27 DIAGNOSIS — F319 Bipolar disorder, unspecified: Secondary | ICD-10-CM | POA: Diagnosis not present

## 2017-02-27 NOTE — Telephone Encounter (Signed)
Spoke with wife, on [place], (patient was unavailable)  and informed her that patient's EEG result from test done in our office was normal.  She stated that she feels her husband is on too many medications, and that is what is causing his symptoms. She stated he has an appointment today with his psychiatrist, and she plans to discuss his psych medications. She stated that Trileptal side effects include some of those he is experiencing.  Advised her that the EEG done in our office is a short picture and that it would not rule out seizures 100%, however an overnight study could be done if warranted. She verbalized understanding, appreciation for call, stated she would tell her husband.

## 2017-03-03 DIAGNOSIS — M6281 Muscle weakness (generalized): Secondary | ICD-10-CM | POA: Diagnosis not present

## 2017-03-03 DIAGNOSIS — M25511 Pain in right shoulder: Secondary | ICD-10-CM | POA: Diagnosis not present

## 2017-03-03 DIAGNOSIS — M25611 Stiffness of right shoulder, not elsewhere classified: Secondary | ICD-10-CM | POA: Diagnosis not present

## 2017-03-03 DIAGNOSIS — M19011 Primary osteoarthritis, right shoulder: Secondary | ICD-10-CM | POA: Diagnosis not present

## 2017-03-05 DIAGNOSIS — M6281 Muscle weakness (generalized): Secondary | ICD-10-CM | POA: Diagnosis not present

## 2017-03-05 DIAGNOSIS — M25611 Stiffness of right shoulder, not elsewhere classified: Secondary | ICD-10-CM | POA: Diagnosis not present

## 2017-03-05 DIAGNOSIS — M25511 Pain in right shoulder: Secondary | ICD-10-CM | POA: Diagnosis not present

## 2017-03-05 DIAGNOSIS — M19011 Primary osteoarthritis, right shoulder: Secondary | ICD-10-CM | POA: Diagnosis not present

## 2017-03-07 ENCOUNTER — Other Ambulatory Visit: Payer: Self-pay | Admitting: Internal Medicine

## 2017-03-09 DIAGNOSIS — Z23 Encounter for immunization: Secondary | ICD-10-CM | POA: Diagnosis not present

## 2017-03-10 DIAGNOSIS — M25611 Stiffness of right shoulder, not elsewhere classified: Secondary | ICD-10-CM | POA: Diagnosis not present

## 2017-03-10 DIAGNOSIS — M6281 Muscle weakness (generalized): Secondary | ICD-10-CM | POA: Diagnosis not present

## 2017-03-10 DIAGNOSIS — M19011 Primary osteoarthritis, right shoulder: Secondary | ICD-10-CM | POA: Diagnosis not present

## 2017-03-10 DIAGNOSIS — M25511 Pain in right shoulder: Secondary | ICD-10-CM | POA: Diagnosis not present

## 2017-03-12 DIAGNOSIS — M6281 Muscle weakness (generalized): Secondary | ICD-10-CM | POA: Diagnosis not present

## 2017-03-12 DIAGNOSIS — G518 Other disorders of facial nerve: Secondary | ICD-10-CM | POA: Diagnosis not present

## 2017-03-12 DIAGNOSIS — M25611 Stiffness of right shoulder, not elsewhere classified: Secondary | ICD-10-CM | POA: Diagnosis not present

## 2017-03-12 DIAGNOSIS — R51 Headache: Secondary | ICD-10-CM | POA: Diagnosis not present

## 2017-03-12 DIAGNOSIS — M791 Myalgia: Secondary | ICD-10-CM | POA: Diagnosis not present

## 2017-03-12 DIAGNOSIS — M25511 Pain in right shoulder: Secondary | ICD-10-CM | POA: Diagnosis not present

## 2017-03-12 DIAGNOSIS — M19011 Primary osteoarthritis, right shoulder: Secondary | ICD-10-CM | POA: Diagnosis not present

## 2017-03-12 DIAGNOSIS — G4452 New daily persistent headache (NDPH): Secondary | ICD-10-CM | POA: Diagnosis not present

## 2017-03-12 DIAGNOSIS — M542 Cervicalgia: Secondary | ICD-10-CM | POA: Diagnosis not present

## 2017-03-13 ENCOUNTER — Ambulatory Visit (INDEPENDENT_AMBULATORY_CARE_PROVIDER_SITE_OTHER): Payer: Medicare Other | Admitting: Neurology

## 2017-03-13 ENCOUNTER — Encounter: Payer: Self-pay | Admitting: Neurology

## 2017-03-13 VITALS — BP 147/85 | HR 77 | Ht 71.0 in | Wt 236.0 lb

## 2017-03-13 DIAGNOSIS — I639 Cerebral infarction, unspecified: Secondary | ICD-10-CM

## 2017-03-13 DIAGNOSIS — F319 Bipolar disorder, unspecified: Secondary | ICD-10-CM | POA: Diagnosis not present

## 2017-03-13 DIAGNOSIS — R4789 Other speech disturbances: Secondary | ICD-10-CM | POA: Diagnosis not present

## 2017-03-13 NOTE — Patient Instructions (Signed)
I had a long discussion with the patient and his wife regarding his intermittent since functional speech disorder which is likely related to underlying psychogenic stressors and conversion disorder. I do not believe further neurological testing is necessary. I recommend patient follow-up with a psychiatrist Dr. Dominica Severin Cottlel and follow-up with Dr. Domingo Cocking for his headache and shoulder pain. No further neurological follow-up with me is necessary. He and his wife express understanding.

## 2017-03-13 NOTE — Progress Notes (Signed)
[facility] [address] [place]. [place] [address] [phone]       OFFICE CONSULT NOTE  [name] Date of Birth:  01/13/1946 Medical Record Number:  [id number]   Referring MD:  Charlesetta Shanks  Reason for Referral:   Speech difficulty  HPI: Initial Consult 11/07/2016 :Mr [name] is a 71 year Caucasian male who is referred today from the emergency room where he was evaluated for episode of speech difficulty which has persisted. He is accompanied by his wife and daughter who complement his history. I have reviewed electronic medical records from recent ER visit as well as personally reviewed imaging films. He states that his symptoms began about 2 months ago when he started having headache and neck pain. He has been taking Mobic for arthritis as well as tramadol which provides some relief. His primary physician has in fact referred him for headache and malaise clinic and he has an upcoming appointment to be seen there. He noticed 3 days ago that he had some trouble speaking getting words out and completing sentences and does struggle and started talking. His symptoms worsened over the weekend and Unasyn in the emergency room on 11/04/16. Speech was evaluated by Dr. Christy Sartorius neuro hospitalists and noticed that his speech was slow, laborious, dysarthric and halting in quality. He initially saw his PCP thought symptoms were related to stroke. Apparently patient had some right-sided weakness and facial droop mentioned as well. However today the patient and family members do not mention this to me. He had MRI scan of the brain done that day which have personally reviewed shows no acute abnormality. There are mild age-related changes of chronic small vessel disease. Patient does have a history of atrial fibrillation and does take Xarelto for that. He does have underlying psychiatric condition bipolar disorder for which he sees the physician assistant Comer Locket and Dr. Morey Hummingbird  Cottrell's office. He has not yet been evaluated for his present symptoms by them. Dr. 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 The patient had 2 hence informed uncle that he could no longer provide care for him. He was upset and did mention this to his wife. He denies any prior history of strokes, TIAs, migraine headaches, significant head injury with loss of consciousness. He states his bipolar disorder is stable and he has been on the current dosages of Trileptal, Xanax for a long time. The only new medication changes or arthritic pain medication Mobic   Update 03/13/2017 :  He returns for follow-up after last visit with me 4 months ago. Patient has had 2 further episodes of intermittent speech difficulties and has been seen in the emergency room at [facility]. He was seen and treated at 2018 and had CT angiogram of brain and neck done which was unremarkable. The symptoms improved. He has seen his psychiatrist Dr. Janora Norlander and his medications have been adjusted. He was on Trileptal which has been discontinued and he is currently taking Trilafon. The patient had been EEG done in our office on 02/18/17 which was normal. He says his posterior head pain and shoulder pain is improved and does see  Dr. Orie Rout and get shots in his neck. ROS:   14 system review of systems is positive for  double vision, activity change, blurred vision, allergies, easy bruising and bleeding, headache, speech difficulty, tremors, back pain, muscle cramps, walking difficulty, neck pain and stiffness, frequent waking, anxiety, nervousness, hyperactivity and all other systems  negative  PMH:  Past Medical History:  Diagnosis Date  . Arthritis   . Atrial fibrillation ([place]) 01/12/2010   2D Echo EF=>55%  . Bipolar disorder ([place])   . Chronic back pain   . COPD (chronic obstructive pulmonary disease) ([place])   . Depression   . Dysrhythmia    AFib  . History of colonic polyps   . History of gout   . Hyperlipidemia   . Hypertension   . Hypothyroidism   . Morbid obesity ([place])   . OSA (obstructive sleep apnea)    AHI was 27.62hr RDI was 34.3 hr REM 0.00hr; had CPAP years ago but no longer uses.  . Prostatitis   . Tubular adenoma of colon 01/2016    Social History:  Social History   Social History  . Marital status: Married    Spouse name: N/A  . Number of children: N/A  . Years of education: N/A   Occupational History  . Not on file.   Social History Main Topics  . Smoking status: Never Smoker  . Smokeless tobacco: Never Used  . Alcohol use No  . Drug use: No  . Sexual activity: Yes    Birth control/ protection: None   Other Topics Concern  . Not on file   Social History Narrative  . No narrative on file    Medications:   Current Outpatient Prescriptions on File Prior to Visit  Medication Sig Dispense Refill  . ALPRAZolam (XANAX) 0.5 MG tablet Take 0.5 mg by mouth 2 (two) times daily as needed for anxiety.     . cholecalciferol (VITAMIN D) 1000 UNITS tablet Take 2,000 Units by mouth daily.    . citalopram (CELEXA) 20 MG tablet Take 1 tablet by mouth at bedtime.     . docusate sodium (COLACE) 50 MG capsule Take 50 mg by mouth 2 (two) times daily.    . hydrochlorothiazide (HYDRODIURIL) 12.5 MG tablet Take 1 tablet (12.5 mg total) by mouth daily. 90 tablet 3  . Lactobacillus (DIGESTIVE HEALTH PROBIOTIC) CAPS Take 1 tablet by mouth daily.    Marland Kitchen levothyroxine (SYNTHROID, LEVOTHROID) 25 MCG tablet Take 25 mcg by mouth at bedtime.     . pantoprazole (PROTONIX) 40 MG tablet TAKE ONE TABLET BY MOUTH ONCE DAILY 30 tablet 11  . rivaroxaban  (XARELTO) 20 MG TABS tablet Take 20 mg by mouth daily after supper.     . traMADol (ULTRAM) 50 MG tablet Take 50 mg by mouth 3 (three) times daily as needed for moderate pain.     No current facility-administered medications on file prior to visit.     Allergies:   Allergies  Allergen Reactions  . Amlodipine Other (See Comments)    Stopped due to kidney issues  . Vicodin [Hydrocodone-Acetaminophen] Other (See Comments)    Patient is unsure of reaction    Physical Exam General: well developed, well nourished 79 Caucasian male, seated, in no evident distress Head: head normocephalic and atraumatic.   Neck: supple with no carotid or supraclavicular bruits Cardiovascular: regular rate and rhythm, no murmurs Musculoskeletal: no deformity Skin:  no rash/petichiae Vascular:  Normal pulses all extremities  Neurologic Exam Mental  Status: Awake and fully alert. Oriented to place and time. Recent and remote memory intact. Attention span, concentration and fund of knowledge appropriate. Mood and affect appropriate. Speech is  Clear today without significant hesitancy or dysarthria  Cranial Nerves: Fundoscopic exam reveals sharp disc margins. Pupils equal, briskly reactive to light. Extraocular movements full without nystagmus. Visual fields full to confrontation. Hearing intact. Facial sensation intact. Face, tongue, palate moves normally and symmetrically.  Motor: Normal bulk and tone. Normal strength in all tested extremity muscles. Mild bilateral action tremors of the upper extremities which diminish at rest or rigidity or bradykinesia. Sensory.: intact to touch , pinprick , position and vibratory sensation.  Coordination: Rapid alternating movements normal in all extremities. Finger-to-nose and heel-to-shin performed accurately bilaterally. Gait and Station: Arises from chair without difficulty. Stance is stooped stooped cautious with favoring of his back and bending forward. Gait . Not  able to heel, toe and tandem walk without difficulty.  Reflexes: 1+ and symmetric. Toes downgoing.       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 Otherwise unremarkable neurological exam and brain imaging studies. He does have underlying psychosocial stressors and history of bipolar disorder at baseline. He also has chronic headaches which are likely muscle tension headaches.   PLAN:  I had a long discussion with the patient and his wife regarding his intermittent since functional speech disorder which is likely related to underlying psychogenic stressors and conversion disorder. I do not believe further neurological testing is necessary. I recommend patient follow-up with a psychiatrist Dr. Dominica Severin Cottlel and follow-up with Dr. Domingo Cocking for his headache and shoulder pain. No further neurological follow-up with me is necessary. He and his wife express understanding. Greater than 50% time during this 20 minute   visit was spent on counseling and coordination of care about his speech difficulties discussion of psychosocial stressors and answering questions No scheduled follow-up appointment made Antony Contras, MD  [facility] [address] [place], [place] [address]  Phone [phone] Fax [phone] Note: This document was prepared with digital dictation and possible smart phrase technology. Any transcriptional errors that result from this process are unintentional.

## 2017-03-14 NOTE — Progress Notes (Signed)
Rn fax Dr.Sethi notes to patients PCP. Pt was discharge back to his PCP.

## 2017-03-20 DIAGNOSIS — M6281 Muscle weakness (generalized): Secondary | ICD-10-CM | POA: Diagnosis not present

## 2017-03-20 DIAGNOSIS — M25511 Pain in right shoulder: Secondary | ICD-10-CM | POA: Diagnosis not present

## 2017-03-20 DIAGNOSIS — M19011 Primary osteoarthritis, right shoulder: Secondary | ICD-10-CM | POA: Diagnosis not present

## 2017-03-20 DIAGNOSIS — M25611 Stiffness of right shoulder, not elsewhere classified: Secondary | ICD-10-CM | POA: Diagnosis not present

## 2017-03-24 DIAGNOSIS — M19011 Primary osteoarthritis, right shoulder: Secondary | ICD-10-CM | POA: Diagnosis not present

## 2017-03-25 ENCOUNTER — Encounter (HOSPITAL_COMMUNITY): Payer: Self-pay

## 2017-03-25 ENCOUNTER — Emergency Department (HOSPITAL_COMMUNITY)
Admission: EM | Admit: 2017-03-25 | Discharge: 2017-03-25 | Disposition: A | Discharge status: Home / Self Care | Payer: Medicare Other | Attending: Emergency Medicine | Admitting: Emergency Medicine

## 2017-03-25 DIAGNOSIS — R21 Rash and other nonspecific skin eruption: Secondary | ICD-10-CM | POA: Insufficient documentation

## 2017-03-25 DIAGNOSIS — I10 Essential (primary) hypertension: Secondary | ICD-10-CM | POA: Insufficient documentation

## 2017-03-25 DIAGNOSIS — Z79899 Other long term (current) drug therapy: Secondary | ICD-10-CM | POA: Insufficient documentation

## 2017-03-25 DIAGNOSIS — J449 Chronic obstructive pulmonary disease, unspecified: Secondary | ICD-10-CM | POA: Insufficient documentation

## 2017-03-25 DIAGNOSIS — E039 Hypothyroidism, unspecified: Secondary | ICD-10-CM | POA: Diagnosis not present

## 2017-03-25 DIAGNOSIS — Z7901 Long term (current) use of anticoagulants: Secondary | ICD-10-CM | POA: Insufficient documentation

## 2017-03-25 DIAGNOSIS — F319 Bipolar disorder, unspecified: Secondary | ICD-10-CM | POA: Insufficient documentation

## 2017-03-25 LAB — CBC
HEMATOCRIT: 40.5 % (ref 39.0–52.0)
Hemoglobin: 13.4 g/dL (ref 13.0–17.0)
MCH: 31.1 pg (ref 26.0–34.0)
MCHC: 33.1 g/dL (ref 30.0–36.0)
MCV: 94 fL (ref 78.0–100.0)
Platelets: 179 10*3/uL (ref 150–400)
RBC: 4.31 MIL/uL (ref 4.22–5.81)
RDW: 13.3 % (ref 11.5–15.5)
WBC: 5.3 10*3/uL (ref 4.0–10.5)

## 2017-03-25 LAB — COMPREHENSIVE METABOLIC PANEL
ALBUMIN: 4.1 g/dL (ref 3.5–5.0)
ALT: 35 U/L (ref 17–63)
AST: 44 U/L — AB (ref 15–41)
Alkaline Phosphatase: 73 U/L (ref 38–126)
Anion gap: 8 (ref 5–15)
BILIRUBIN TOTAL: 0.6 mg/dL (ref 0.3–1.2)
BUN: 16 mg/dL (ref 6–20)
CO2: 27 mmol/L (ref 22–32)
Calcium: 9.7 mg/dL (ref 8.9–10.3)
Chloride: 105 mmol/L (ref 101–111)
Creatinine, Ser: 1.15 mg/dL (ref 0.61–1.24)
GFR calc Af Amer: >60 mL/min (ref >60)
GFR calc non Af Amer: >60 mL/min (ref >60)
GLUCOSE: 116 mg/dL — AB (ref 65–99)
POTASSIUM: 3.2 mmol/L — AB (ref 3.5–5.1)
Sodium: 140 mmol/L (ref 135–145)
TOTAL PROTEIN: 6.8 g/dL (ref 6.5–8.1)

## 2017-03-25 LAB — ACETAMINOPHEN LEVEL: Acetaminophen (Tylenol), Serum: <10 ug/mL — ABNORMAL LOW (ref 10–30)

## 2017-03-25 LAB — SALICYLATE LEVEL: Salicylate Lvl: <7 mg/dL (ref 2.8–30.0)

## 2017-03-25 LAB — ETHANOL: Alcohol, Ethyl (B): <5 mg/dL (ref <5)

## 2017-03-25 MED ORDER — POTASSIUM CHLORIDE CRYS ER 20 MEQ PO TBCR
40.0000 meq | EXTENDED_RELEASE_TABLET | Freq: Once | ORAL | Status: AC
  Administered 2017-03-25: 40 meq via ORAL
  Filled 2017-03-25: qty 2

## 2017-03-25 NOTE — ED Notes (Addendum)
Patient yelling "I just want to go home!" from the room.  When this RN entered, patient had disconnected himself from all of the monitoring, and was sitting at the edge of the bed.  This RN attempted to ask patient why he wanted to go home, patient states "I want to see the doctor".  This RN made MD aware.

## 2017-03-25 NOTE — Discharge Instructions (Signed)
It is not clear why you are having a rash.  It is unlikely to be related to the medication.  Please see your psychiatrist tomorrow, as planned and discussed the appropriate medications, for your condition.  To treat the burning sensation with the rash, use Benadryl 2 or 3 times a day.  Ask your psychiatrist about possible treatments for insomnia.

## 2017-03-25 NOTE — ED Notes (Signed)
Patient able to ambulate independently  

## 2017-03-25 NOTE — ED Provider Notes (Signed)
[facility] DEPT Provider Note   CSN: [id number] Arrival date & time: 03/25/17  1416     History   Chief Complaint Chief Complaint  Patient presents with  . Allergic Reaction    HPI Casey [name] is a 71 y.o. male.  He presents for evaluation of rash which started today.  He is worried that it was caused by medications, which were given for his bipolar.  He states these medicines were changed at some unknown time in the past.  He does not know what medicines were changed.  He also complains of sleeplessness for 2 nights, and increasing agitation.  He has not seen his psychiatric provider recently.  He does have a follow-up appointment with the psychiatric provider tomorrow morning.  He denies fever, chills, shortness of breath, chest pain, nausea, vomiting, weakness or dizziness.  He states that he sees a dermatologist, for "skin cancer."  He notices that the rashes on his bilateral forearms.  He states that the rash "burns."  He denies pruritus.  He denies difficulty swallowing.  There are no other known modifying factors.   HPI  Past Medical History:  Diagnosis Date  . Arthritis   . Atrial fibrillation ([place]) 01/12/2010   2D Echo EF=>55%  . Bipolar disorder ([place])   . Chronic back pain   . COPD (chronic obstructive pulmonary disease) ([place])   . Depression   . Dysrhythmia    AFib  . History of colonic polyps   . History of gout   . Hyperlipidemia   . Hypertension   . Hypothyroidism   . Morbid obesity ([place])   . OSA (obstructive sleep apnea)    AHI was 27.62hr RDI was 34.3 hr REM 0.00hr; had CPAP years ago but no longer uses.  . Prostatitis   . Tubular adenoma of colon 01/2016    Patient Active Problem List   Diagnosis Date Noted  . Dysphasia 02/07/2017  . Mild obesity 05/10/2016  . History of colonic polyps   . Diverticulosis of colon without hemorrhage   . Hyperlipidemia LDL goal <130 05/16/2013  . KNEE PAIN 08/07/2009  . KNEE SPRAIN, ACUTE 08/07/2009  .  COLONIC POLYPS, HX OF 05/10/2009  . HYPOTHYROIDISM 05/08/2009  . Bipolar disorder ([place]) 05/08/2009  . Essential hypertension 05/08/2009  . FIBRILLATION, ATRIAL 05/08/2009  . HEMATOCHEZIA 05/08/2009  . LOW BACK PAIN, CHRONIC 05/08/2009  . ABDOMINAL PAIN 05/08/2009    Past Surgical History:  Procedure Laterality Date  . APPENDECTOMY  1959  . asthma    . CATARACT EXTRACTION Bilateral 1995  . COLONOSCOPY  2011   RMR: left-sided diverticula, minimal rectal friability. No polyps. Repeat 5 years.  . COLONOSCOPY WITH PROPOFOL N/A 01/15/2016   Dr.Rourk- diverticulosis in the entire examined colon, multiple rectal and colonic polyps, internal hemorrhoids. bx= tubular adenomas and hyperplastic polyps.   Marland Kitchen HAND / FINGER LESION EXCISION Right   . POLYPECTOMY  01/15/2016   Procedure: POLYPECTOMY;  Surgeon: Daneil Dolin, MD;  Location: [facility];  Service: Endoscopy;;  . right shoulder surgery    . TONSILLECTOMY  1955       Home Medications    Prior to Admission medications   Medication Sig Start Date End Date Taking? Authorizing Provider  acetaminophen (TYLENOL) 500 MG tablet Take 1,000 mg by mouth every 6 (six) hours as needed for headache.   Yes [provider]  Albuterol Sulfate 108 (90 Base) MCG/ACT AEPB Inhale 1-2 puffs into the lungs as needed.  Yes [provider]  ALPRAZolam Duanne Moron) 0.5 MG tablet Take 0.5 mg by mouth 2 (two) times daily as needed for anxiety.    Yes [provider]  cholecalciferol (VITAMIN D) 1000 UNITS tablet Take 2,000 Units by mouth daily.   Yes [provider]  citalopram (CELEXA) 10 MG tablet Take 10 mg by mouth at bedtime.  11/24/13  Yes [provider]  docusate sodium (COLACE) 50 MG capsule Take 50 mg by mouth 2 (two) times daily.   Yes [provider]  hydrochlorothiazide (HYDRODIURIL) 12.5 MG tablet Take 1 tablet (12.5 mg total) by mouth daily. 05/09/16  Yes Croitoru, Mihai, MD    HYDROcodone-acetaminophen (NORCO) 7.5-325 MG tablet Take 1 tablet by mouth every 6 (six) hours as needed for moderate pain.   Yes [provider]  Lactobacillus (DIGESTIVE HEALTH PROBIOTIC) CAPS Take 1 tablet by mouth daily.   Yes [provider]  lamoTRIgine (LAMICTAL) 25 MG tablet Take 25 mg by mouth daily.    Yes [provider]  levothyroxine (SYNTHROID, LEVOTHROID) 25 MCG tablet Take 25 mcg by mouth daily before breakfast.  04/06/15  Yes [provider]  pantoprazole (PROTONIX) 40 MG tablet TAKE ONE TABLET BY MOUTH ONCE DAILY Patient taking differently: TAKE 40mg  TABLET BY MOUTH ONCE DAILY 03/07/17  Yes Carlis Stable, NP  perphenazine (TRILAFON) 4 MG tablet Take 4 mg by mouth 2 (two) times daily.    Yes [provider]  polyethylene glycol (MIRALAX) 0.34 gm/ml SOLN Take 1 g/kg by mouth 2 (two) times daily. With 4 ounces of water   Yes [provider]  rivaroxaban (XARELTO) 20 MG TABS tablet Take 20 mg by mouth daily after supper.    Yes [provider]  traMADol (ULTRAM) 50 MG tablet Take 50 mg by mouth 3 (three) times daily as needed for moderate pain.   Yes [provider]  vitamin B-12 (CYANOCOBALAMIN) 500 MCG tablet Take 500 mcg by mouth daily.   Yes [provider]    Family History Family History  Problem Relation Age of Onset  . Diabetes Mother   . Heart failure Mother   . Hypertension Mother   . Hypertension Father   . Cancer Paternal Grandmother   . Stroke Sister   . Colon cancer Neg Hx     Social History Social History  Substance Use Topics  . Smoking status: Never Smoker  . Smokeless tobacco: Never Used  . Alcohol use No     Allergies   Amlodipine and Vicodin [hydrocodone-acetaminophen]   Review of Systems Review of Systems  All other systems reviewed and are negative.    Physical Exam Updated Vital Signs BP (!) 154/103   Pulse 64   Temp 98 F (36.7 C)   Resp 18   SpO2  97%   Physical Exam  Constitutional: He is oriented to person, place, and time. He appears well-developed and well-nourished.  HENT:  Head: Normocephalic and atraumatic.  Right Ear: External ear normal.  Left Ear: External ear normal.  Eyes: Pupils are equal, round, and reactive to light. Conjunctivae and EOM are normal.  Neck: Normal range of motion and phonation normal. Neck supple.  Cardiovascular: Normal rate, regular rhythm and normal heart sounds.   Pulmonary/Chest: Effort normal and breath sounds normal. He exhibits no bony tenderness.  Abdominal: Soft. There is no tenderness.  Musculoskeletal: Normal range of motion.  Neurological: He is alert and oriented to person, place, and time. No cranial nerve deficit  or sensory deficit. He exhibits normal muscle tone. Coordination normal.  Skin: Skin is warm, dry and intact.  Ill-defined red rash over both forearms, slightly raised, most likely consistent with contact dermatitis.  Red papules on the chest which appear to be a mild case of folliculitis, which the patient states has been there for quite some time.  Psychiatric:  Anxious.  Peculiar alternating voice tone, high-pitched to low pitch.  He seems to be distractible.  He does not appear to be responding to internal stimuli.  Nursing note and vitals reviewed.    ED Treatments / Results  Labs (all labs ordered are listed, but only abnormal results are displayed) Labs Reviewed  COMPREHENSIVE METABOLIC PANEL - Abnormal; Notable for the following:       Result Value   Potassium 3.2 (*)    Glucose, Bld 116 (*)    AST 44 (*)    All other components within normal limits  ACETAMINOPHEN LEVEL - Abnormal; Notable for the following:    Acetaminophen (Tylenol), Serum <10 (*)    All other components within normal limits  ETHANOL  SALICYLATE LEVEL  CBC    EKG  EKG Interpretation None       Radiology No results found.  Procedures Procedures (including critical care  time)  Medications Ordered in ED Medications  potassium chloride SA (K-DUR,KLOR-CON) CR tablet 40 mEq (40 mEq Oral Given 03/25/17 1817)     Initial Impression / Assessment and Plan / ED Course  I have reviewed the triage vital signs and the nursing notes.  Pertinent labs & imaging results that were available during my care of the patient were reviewed by me and considered in my medical decision making (see chart for details).  Clinical Course as of Mar 25 1826  Tue Mar 25, 2017  1818 Clarification of medication, by contacting the patient's pharmacy.  On February 27, 2017, he was instructed to begin weaning off Lamictal, and was started on Trilafon.  He is currently on the Trilafon and taking 25 mg of Lamictal, by his report.  [EW]    Clinical Course User Index [EW] Daleen Bo, MD     Patient Vitals for the past 24 hrs:  BP Temp Pulse Resp SpO2  03/25/17 1800 (!) 154/103 - 64 - 97 %  03/25/17 1751 (!) 139/119 - 92 18 98 %  03/25/17 1730 137/90 - 80 - 99 %  03/25/17 1715 (!) 142/79 - 86 - 98 %  03/25/17 1645 (!) 181/83 - (!) 105 - 96 %  03/25/17 1429 (!) 160/92 98 F (36.7 C) 70 18 98 %    6:27 PM Reevaluation with update and discussion. After initial assessment and treatment, an updated evaluation reveals he is comfortable and has no further complaints.  Findings discussed with the patient and his wife, all questions were answered. Akshara Blumenthal L      Final Clinical Impressions(s) / ED Diagnoses   Final diagnoses:  Rash    Nonspecific rash confined to the patient's arms.  Patient appears to have some confusion over the appropriate psychiatric medications.  He has a short-term follow-up planned for tomorrow morning with his provider.  No indication for hospitalization, or change in medications at this time.  Nursing Notes Reviewed/ Care Coordinated Applicable Imaging Reviewed Interpretation of Laboratory Data incorporated into ED treatment  The patient appears  reasonably screened and/or stabilized for discharge and I doubt any other medical condition or other [facility] requiring further screening, evaluation, or treatment in the ED  at this time prior to discharge.  Plan: Home Medications-continue current medications; Home Treatments-rest, fluids; return here if the recommended treatment, does not improve the symptoms; Recommended follow up-PCP, as needed   New Prescriptions New Prescriptions   No medications on file     Daleen Bo, MD 03/25/17 [phone]

## 2017-03-25 NOTE — ED Notes (Signed)
Esignature pad not working.  Pt verbalized underwstanding of discharge instructions.

## 2017-03-25 NOTE — ED Triage Notes (Signed)
Patient sent from MD for possible reaction to bipolar meds . Changed meds 6 weeks ago and has rash and tremors that started today. Patient alert and oriented, reports not sleeping much at all, taking xanax and benadryl.

## 2017-03-26 DIAGNOSIS — F319 Bipolar disorder, unspecified: Secondary | ICD-10-CM | POA: Diagnosis not present

## 2017-03-27 DIAGNOSIS — M25611 Stiffness of right shoulder, not elsewhere classified: Secondary | ICD-10-CM | POA: Diagnosis not present

## 2017-03-27 DIAGNOSIS — M19011 Primary osteoarthritis, right shoulder: Secondary | ICD-10-CM | POA: Diagnosis not present

## 2017-03-27 DIAGNOSIS — M6281 Muscle weakness (generalized): Secondary | ICD-10-CM | POA: Diagnosis not present

## 2017-03-27 DIAGNOSIS — M25511 Pain in right shoulder: Secondary | ICD-10-CM | POA: Diagnosis not present

## 2017-04-01 DIAGNOSIS — L570 Actinic keratosis: Secondary | ICD-10-CM | POA: Diagnosis not present

## 2017-04-01 DIAGNOSIS — D225 Melanocytic nevi of trunk: Secondary | ICD-10-CM | POA: Diagnosis not present

## 2017-04-01 DIAGNOSIS — Z1283 Encounter for screening for malignant neoplasm of skin: Secondary | ICD-10-CM | POA: Diagnosis not present

## 2017-04-01 DIAGNOSIS — X32XXXD Exposure to sunlight, subsequent encounter: Secondary | ICD-10-CM | POA: Diagnosis not present

## 2017-04-01 DIAGNOSIS — B078 Other viral warts: Secondary | ICD-10-CM | POA: Diagnosis not present

## 2017-04-03 DIAGNOSIS — F319 Bipolar disorder, unspecified: Secondary | ICD-10-CM | POA: Diagnosis not present

## 2017-04-11 DIAGNOSIS — F319 Bipolar disorder, unspecified: Secondary | ICD-10-CM | POA: Diagnosis not present

## 2017-04-18 ENCOUNTER — Ambulatory Visit (HOSPITAL_COMMUNITY)
Admission: RE | Admit: 2017-04-18 | Discharge: 2017-04-18 | Disposition: A | Discharge status: Home / Self Care | Payer: Medicare Other | Source: Ambulatory Visit | Attending: Family Medicine | Admitting: Family Medicine

## 2017-04-18 ENCOUNTER — Other Ambulatory Visit (HOSPITAL_COMMUNITY): Payer: Self-pay | Admitting: Family Medicine

## 2017-04-18 DIAGNOSIS — M7989 Other specified soft tissue disorders: Secondary | ICD-10-CM | POA: Diagnosis not present

## 2017-04-18 DIAGNOSIS — R609 Edema, unspecified: Secondary | ICD-10-CM | POA: Insufficient documentation

## 2017-04-18 DIAGNOSIS — Z1389 Encounter for screening for other disorder: Secondary | ICD-10-CM | POA: Diagnosis not present

## 2017-04-18 DIAGNOSIS — J302 Other seasonal allergic rhinitis: Secondary | ICD-10-CM | POA: Diagnosis not present

## 2017-04-18 DIAGNOSIS — Z6834 Body mass index (BMI) 34.0-34.9, adult: Secondary | ICD-10-CM | POA: Diagnosis not present

## 2017-04-18 DIAGNOSIS — F319 Bipolar disorder, unspecified: Secondary | ICD-10-CM | POA: Diagnosis not present

## 2017-04-18 DIAGNOSIS — E6609 Other obesity due to excess calories: Secondary | ICD-10-CM | POA: Diagnosis not present

## 2017-04-21 DIAGNOSIS — F319 Bipolar disorder, unspecified: Secondary | ICD-10-CM | POA: Diagnosis not present

## 2017-04-23 DIAGNOSIS — R51 Headache: Secondary | ICD-10-CM | POA: Diagnosis not present

## 2017-04-23 DIAGNOSIS — M542 Cervicalgia: Secondary | ICD-10-CM | POA: Diagnosis not present

## 2017-04-23 DIAGNOSIS — G4452 New daily persistent headache (NDPH): Secondary | ICD-10-CM | POA: Diagnosis not present

## 2017-04-23 DIAGNOSIS — G518 Other disorders of facial nerve: Secondary | ICD-10-CM | POA: Diagnosis not present

## 2017-04-23 DIAGNOSIS — M791 Myalgia, unspecified site: Secondary | ICD-10-CM | POA: Diagnosis not present

## 2017-04-24 DIAGNOSIS — Z6834 Body mass index (BMI) 34.0-34.9, adult: Secondary | ICD-10-CM | POA: Diagnosis not present

## 2017-04-24 DIAGNOSIS — E6609 Other obesity due to excess calories: Secondary | ICD-10-CM | POA: Diagnosis not present

## 2017-04-24 DIAGNOSIS — J301 Allergic rhinitis due to pollen: Secondary | ICD-10-CM | POA: Diagnosis not present

## 2017-04-28 DIAGNOSIS — F319 Bipolar disorder, unspecified: Secondary | ICD-10-CM | POA: Diagnosis not present

## 2017-05-02 DIAGNOSIS — M47816 Spondylosis without myelopathy or radiculopathy, lumbar region: Secondary | ICD-10-CM | POA: Diagnosis not present

## 2017-05-05 ENCOUNTER — Encounter: Payer: Self-pay | Admitting: Cardiology

## 2017-05-05 ENCOUNTER — Ambulatory Visit (INDEPENDENT_AMBULATORY_CARE_PROVIDER_SITE_OTHER): Payer: Medicare Other | Admitting: Cardiology

## 2017-05-05 VITALS — BP 135/72 | HR 47 | Ht 71.0 in | Wt 243.0 lb

## 2017-05-05 DIAGNOSIS — M5440 Lumbago with sciatica, unspecified side: Secondary | ICD-10-CM

## 2017-05-05 DIAGNOSIS — I482 Chronic atrial fibrillation: Secondary | ICD-10-CM

## 2017-05-05 DIAGNOSIS — I10 Essential (primary) hypertension: Secondary | ICD-10-CM

## 2017-05-05 DIAGNOSIS — G4730 Sleep apnea, unspecified: Secondary | ICD-10-CM | POA: Insufficient documentation

## 2017-05-05 DIAGNOSIS — R600 Localized edema: Secondary | ICD-10-CM

## 2017-05-05 DIAGNOSIS — I4821 Permanent atrial fibrillation: Secondary | ICD-10-CM

## 2017-05-05 DIAGNOSIS — I639 Cerebral infarction, unspecified: Secondary | ICD-10-CM

## 2017-05-05 DIAGNOSIS — Z7901 Long term (current) use of anticoagulants: Secondary | ICD-10-CM | POA: Diagnosis not present

## 2017-05-05 NOTE — Assessment & Plan Note (Signed)
Now on Xarelto CHADS VASc=2

## 2017-05-05 NOTE — Progress Notes (Signed)
05/05/2017 Casey [name]   08-Dec-1945  [id number]  Primary Physician Sharilyn Sites, MD Primary Cardiologist: Dr Sallyanne Kuster  HPI:  71 y/o male followed by Dr Sallyanne Kuster with a history of CAF. He is anticoagulated on Xarelto. He is not on rate control medications suggesting intrinsic conduction disease. He is asymptomatic with his AF. He is in the office today for a one year follow up. He had shoulder surgery in July and had a conversion reaction afterwards. He went to the ED and a Code Stroke was called. Work up was negative and review of his records suggested a similar pattern in the past secondary to stress. He was sent home and has done fine. In Sept he went to the ED secondary to a rash which he felt was secondary to psych medication. These were adjusted and he is doing better. He denies chest pain. He is unaware of his AF. He had LE venous dopplers Oct 5th  after he saw Dr Hilma Favors with complaints of edema in his leg, thesde were negative for DVT.    Current Outpatient Prescriptions  Medication Sig Dispense Refill  . acetaminophen (TYLENOL) 500 MG tablet Take 1,000 mg by mouth every 6 (six) hours as needed for headache.    . Albuterol Sulfate 108 (90 Base) MCG/ACT AEPB Inhale 1-2 puffs into the lungs as needed.     . ALPRAZolam (XANAX) 0.5 MG tablet Take 0.5 mg by mouth 2 (two) times daily as needed for anxiety.     . cholecalciferol (VITAMIN D) 1000 UNITS tablet Take 2,000 Units by mouth daily.    Marland Kitchen docusate sodium (COLACE) 50 MG capsule Take 50 mg by mouth 2 (two) times daily.    . hydrochlorothiazide (HYDRODIURIL) 12.5 MG tablet Take 1 tablet (12.5 mg total) by mouth daily. 90 tablet 3  . HYDROcodone-acetaminophen (NORCO) 7.5-325 MG tablet Take 1 tablet by mouth every 6 (six) hours as needed for moderate pain.    . Lactobacillus (DIGESTIVE HEALTH PROBIOTIC) CAPS Take 1 tablet by mouth daily.    Marland Kitchen levothyroxine (SYNTHROID, LEVOTHROID) 25 MCG tablet Take 25 mcg by mouth daily before  breakfast.     . pantoprazole (PROTONIX) 40 MG tablet TAKE ONE TABLET BY MOUTH ONCE DAILY (Patient taking differently: TAKE 40mg  TABLET BY MOUTH ONCE DAILY) 30 tablet 11  . perphenazine (TRILAFON) 4 MG tablet Take 4 mg by mouth 2 (two) times daily.     . polyethylene glycol (MIRALAX) 0.34 gm/ml SOLN Take 1 g/kg by mouth 2 (two) times daily. With 4 ounces of water    . rivaroxaban (XARELTO) 20 MG TABS tablet Take 20 mg by mouth daily after supper.     . vitamin B-12 (CYANOCOBALAMIN) 500 MCG tablet Take 500 mcg by mouth daily.     No current facility-administered medications for this visit.     Allergies  Allergen Reactions  . Amlodipine Other (See Comments)    Stopped due to kidney issues  . Vicodin [Hydrocodone-Acetaminophen] Other (See Comments)    Patient is unsure of reaction    Past Medical History:  Diagnosis Date  . Arthritis   . Atrial fibrillation ([place]) 01/12/2010   2D Echo EF=>55%  . Bipolar disorder ([place])   . Chronic back pain   . COPD (chronic obstructive pulmonary disease) ([place])   . Depression   . Dysrhythmia    AFib  . History of colonic polyps   . History of gout   . Hyperlipidemia   . Hypertension   .  Hypothyroidism   . Morbid obesity ([place])   . OSA (obstructive sleep apnea)    AHI was 27.62hr RDI was 34.3 hr REM 0.00hr; had CPAP years ago but no longer uses.  . Prostatitis   . Tubular adenoma of colon 01/2016    Social History   Social History  . Marital status: Married    Spouse name: N/A  . Number of children: N/A  . Years of education: N/A   Occupational History  . Not on file.   Social History Main Topics  . Smoking status: Never Smoker  . Smokeless tobacco: Never Used  . Alcohol use No  . Drug use: No  . Sexual activity: Yes    Birth control/ protection: None   Other Topics Concern  . Not on file   Social History Narrative  . No narrative on file     Family History  Problem Relation Age of Onset  . Diabetes Mother   . Heart  failure Mother   . Hypertension Mother   . Hypertension Father   . Cancer Paternal Grandmother   . Stroke Sister   . Colon cancer Neg Hx      Review of Systems: General: negative for chills, fever, night sweats or weight changes.  Cardiovascular: negative for chest pain, dyspnea on exertion, edema, orthopnea, palpitations, paroxysmal nocturnal dyspnea or shortness of breath Dermatological: negative for rash Respiratory: negative for cough or wheezing Urologic: negative for hematuria Abdominal: negative for nausea, vomiting, diarrhea, bright red blood per rectum, melena, or hematemesis Neurologic: negative for visual changes, syncope, or dizziness All other systems reviewed and are otherwise negative except as noted above.    Blood pressure 135/72, pulse (!) 47, height 5\' 11"  (1.803 m), weight 243 lb (110.2 kg), SpO2 94 %.  General appearance: alert, cooperative, no distress and mildly obese Neck: no carotid bruit and no JVD Lungs: clear to auscultation bilaterally Heart: irregularly irregular rhythm Extremities: trace RLE edema Skin: Skin color, texture, turgor normal. No rashes or lesions Neurologic: Grossly normal  EKG AF 110  ASSESSMENT AND PLAN:   Permanent atrial fibrillation (HCC) Pt has had AF dating back to 2000. He has failed to hold NSR despite several cardioversion. He is relatively asymptomatic and rate controlled on no medications. Echo in June 2015- normal LVF with bi atrial enlargement.   Essential hypertension Controlled  Chronic anticoagulation Now on Xarelto CHADS VASc=2  Sleep apnea Unable to tolerate C-pap  LOW BACK PAIN, CHRONIC Followed by Dr Maia Plan. He is currently in physical therapy  History of bipolar disorder Pt has had conversion reactions in the past prompting stroke work ups (negative)   PLAN  Same Rx for now. They tell me Dr Sallyanne Kuster had stopped his rate control medications in the past, I'll review this with Dr Sallyanne Kuster although the  pt does appear to be asymptomatic.    Kerin Ransom PA-C 05/05/2017 3:26 PM

## 2017-05-05 NOTE — Assessment & Plan Note (Signed)
Unable to tolerate C-pap 

## 2017-05-05 NOTE — Assessment & Plan Note (Signed)
Followed by Dr Maia Plan. He is currently in physical therapy

## 2017-05-05 NOTE — Progress Notes (Signed)
I don't think he needs any rate control medications MCr

## 2017-05-05 NOTE — Assessment & Plan Note (Signed)
Pt has had conversion reactions in the past prompting stroke work ups (negative)

## 2017-05-05 NOTE — Assessment & Plan Note (Signed)
Controlled.  

## 2017-05-05 NOTE — Patient Instructions (Signed)
Medication Instructions: Your physician recommends that you continue on your current medications as directed. Please refer to the Current Medication list given to you today.   Follow-Up: Your physician wants you to follow-up in: 1 year with Dr. Croitoru. You will receive a reminder letter in the mail two months in advance. If you don't receive a letter, please call our office to schedule the follow-up appointment.   If you need a refill on your cardiac medications before your next appointment, please call your pharmacy.  

## 2017-05-05 NOTE — Assessment & Plan Note (Signed)
Pt has had AF dating back to 2000. He has failed to hold NSR despite several cardioversion. He is relatively asymptomatic and rate controlled on no medications. Echo in June 2015- normal LVF with bi atrial enlargement.

## 2017-05-06 ENCOUNTER — Ambulatory Visit (HOSPITAL_COMMUNITY): Payer: Medicare Other | Admitting: Physical Therapy

## 2017-05-07 ENCOUNTER — Encounter (HOSPITAL_COMMUNITY): Payer: Self-pay

## 2017-05-07 ENCOUNTER — Ambulatory Visit (HOSPITAL_COMMUNITY): Payer: Medicare Other | Attending: Neurosurgery

## 2017-05-07 DIAGNOSIS — M545 Low back pain: Secondary | ICD-10-CM | POA: Insufficient documentation

## 2017-05-07 DIAGNOSIS — G8929 Other chronic pain: Secondary | ICD-10-CM | POA: Diagnosis not present

## 2017-05-07 DIAGNOSIS — R29898 Other symptoms and signs involving the musculoskeletal system: Secondary | ICD-10-CM | POA: Diagnosis not present

## 2017-05-07 DIAGNOSIS — R262 Difficulty in walking, not elsewhere classified: Secondary | ICD-10-CM | POA: Insufficient documentation

## 2017-05-07 DIAGNOSIS — M6281 Muscle weakness (generalized): Secondary | ICD-10-CM | POA: Diagnosis not present

## 2017-05-07 NOTE — Therapy (Signed)
[place], [place], [address] Phone: [phone]   Fax:  [phone]  Physical Therapy Evaluation  Patient Details  Name: Casey [name] MRN: [id number] Date of Birth: Feb 06, 1946 Referring Provider: Glenna Fellows, MD  Encounter Date: 05/07/2017      PT End of Session - 05/07/17 1427    Visit Number 1   Number of Visits 13   Date for PT Re-Evaluation 05/28/17   Authorization Type Medicare Part A and B   Authorization Time Period 05/07/17 to 06/18/17   Authorization - Visit Number 1   Authorization - Number of Visits 10   PT Start Time 1346   PT Stop Time 1425   PT Time Calculation (min) 39 min   Activity Tolerance Patient tolerated treatment well   Behavior During Therapy [facility] for tasks assessed/performed      Past Medical History:  Diagnosis Date  . Arthritis   . Atrial fibrillation ([place]) 01/12/2010   2D Echo EF=>55%  . Bipolar disorder ([place])   . Chronic back pain   . COPD (chronic obstructive pulmonary disease) ([place])   . Depression   . Dysrhythmia    AFib  . History of colonic polyps   . History of gout   . Hyperlipidemia   . Hypertension   . Hypothyroidism   . Morbid obesity ([place])   . OSA (obstructive sleep apnea)    AHI was 27.62hr RDI was 34.3 hr REM 0.00hr; had CPAP years ago but no longer uses.  . Prostatitis   . Tubular adenoma of colon 01/2016    Past Surgical History:  Procedure Laterality Date  . APPENDECTOMY  1959  . asthma    . CATARACT EXTRACTION Bilateral 1995  . COLONOSCOPY  2011   RMR: left-sided diverticula, minimal rectal friability. No polyps. Repeat 5 years.  . COLONOSCOPY WITH PROPOFOL N/A 01/15/2016   Dr.Rourk- diverticulosis in the entire examined colon, multiple rectal and colonic polyps, internal hemorrhoids. bx= tubular adenomas and hyperplastic polyps.   Marland Kitchen HAND / FINGER LESION EXCISION Right   . POLYPECTOMY  01/15/2016   Procedure: POLYPECTOMY;  Surgeon: Daneil Dolin,  MD;  Location[facility];  Service: Endoscopy;;  . right shoulder surgery    . TONSILLECTOMY  1955    There were no vitals filed for this visit.       Subjective Assessment - 05/07/17 1351    Subjective Pt reports that he is having LBP and difficutly putting his socks and shoes on independently. He has been dealing with his back pain since he was 20 from playing football in high school. He has had therapy for this in the past with some success. He states that he can do anything he needs to besides getting down in the floor and putting his socks and shoes on. His back hurts really bad at night when he goes to bed and he typically wakes up in a lot of pain. He states that Dr. Carloyn Manner told him to try PT again, but he says that surgery is an option. He uses a [facility] for stability and has been using it for about 2 years. He denies any falls or close calls. He denies any radicular symptoms or b/b changes. Bending forward aggravates pain and resting relieves it.    Limitations Sitting;Lifting;Standing;Walking;House hold activities   How long can you sit comfortably? a few hours at a time   How long can you stand comfortably? no issues with cane  How long can you walk comfortably? 15 mins a day   Patient Stated Goals get back in shape, get socks and shoes back on   Currently in Pain? No/denies            [facility] PT Assessment - 05/07/17 0001      Assessment   Referring Provider Glenna Fellows, MD   Next MD Visit 07/24/17   Prior Therapy yes for same issue at different clinics     Balance Screen   Has the patient fallen in the past 6 months No   Has the patient had a decrease in activity level because of a fear of falling?  Yes   Is the patient reluctant to leave their home because of a fear of falling?  No     Prior Function   Level of Independence Independent;Independent with basic ADLs   Vocation Retired   Leisure watch grandchildren play sports     Observation/Other Assessments   Focus on  Therapeutic Outcomes (FOTO)  39% limitation     Sensation   Light Touch Appears Intact     Posture/Postural Control   Posture/Postural Control Postural limitations   Postural Limitations Rounded Shoulders;Forward head;Increased thoracic kyphosis;Increased lumbar lordosis     ROM / Strength   AROM / PROM / Strength AROM;Strength     AROM   Overall AROM Comments pain limited AROM   AROM Assessment Site Lumbar   Lumbar Flexion 50% limited, increased lordosis during flexion   Lumbar Extension 25% limited   Lumbar - Right Side Bend midway down thigh   Lumbar - Left Side Bend midway down thigh   Lumbar - Right Rotation 75% limited   Lumbar - Left Rotation 50% limited     Strength   Strength Assessment Site Hip;Knee;Ankle   Right Hip Flexion 5/5   Right Hip ABduction 4+/5  LBP   Left Hip Flexion 5/5   Left Hip ABduction 4+/5  LBP   Right Knee Flexion 5/5   Right Knee Extension 5/5   Left Knee Flexion 5/5   Left Knee Extension 5/5   Right Ankle Dorsiflexion 5/5   Left Ankle Dorsiflexion 5/5     Flexibility   Soft Tissue Assessment /Muscle Length yes   Hamstrings tight BLE, recreated LBP   Piriformis max restrictions BLE     Palpation   Palpation comment increased soft tissue restrictions and tenderness to palpation throughout lumbar paraspinals, QL, and glutes, R>L throughout     Bed Mobility   Bed Mobility Supine to Sit   Supine to Sit 3: Mod assist   Supine to Sit Details (indicate cue type and reason) cues for proper technique, min to mod A for LE and upper body support     6 Minute Walk- Baseline   6 Minute Walk- Baseline --  perform 3MWT next visit     Balance   Balance Assessed Yes     Static Standing Balance   Static Standing - Balance Support No upper extremity supported   Static Standing Balance -  Activities  Single Leg Stance - Right Leg;Single Leg Stance - Left Leg   Static Standing - Comment/# of Minutes 1 sec or < with no UE support; 10 sec BLE with  bil 2 finger assist     Standardized Balance Assessment   Standardized Balance Assessment Five Times Sit to Stand;Dynamic Gait Index   Five times sit to stand comments  28 sec from chair, no UE     Dynamic  Gait Index   DGI comment: to be performed next visit     Timed Up and Go Test   TUG --        Objective measurements completed on examination: See above findings.          PT Education - 05/07/17 1427    Education provided Yes   Education Details exam findings, POC, HEP   Person(s) Educated Patient   Methods Explanation;Demonstration;Handout   Comprehension Verbalized understanding;Returned demonstration          PT Short Term Goals - 05/07/17 1444      PT SHORT TERM GOAL #1   Title Pt will be independent with HEP and perform consistently to maximize overall function and decrease pain.    Time 3   Period Weeks   Status New   Target Date 05/28/17     PT SHORT TERM GOAL #2   Title Pt will have 5/5 MMT in bil hip abd to maximize gait and balance.    Time 3   Period Weeks   Status New     PT SHORT TERM GOAL #3   Title Pt will be modified independent with log rolling to decrease his LBP and maximize his bed mobility.   Time 3   Period Weeks   Status New           PT Long Term Goals - 05/07/17 1447      PT LONG TERM GOAL #1   Title Pt will be able to perform 5xSTS in 18 sec or < to demo improved functional BLE strength.    Time 6   Period Weeks   Status New   Target Date 06/18/17     PT LONG TERM GOAL #2   Title Pt will be able to perform SLS on BLE for 10 sec with no UE support in order to maximize gait and decrease risk for falls.    Time 6   Period Weeks   Status New     PT LONG TERM GOAL #3   Title Pt will score a 20/24 on the DGI to demo improved balance and decrease risk for falls.   Time 6   Period Weeks   Status New     PT LONG TERM GOAL #4   Title Pt will have improved 3MWT by 122ft or > to demo improved overall tolerance to  movement and maximize community access.    Time 6   Period Weeks   Status New     PT LONG TERM GOAL #5   Title Pt will have improved lumbar flexion AROM by at least 25% to decrease pain and maximize pt's ability to put on his socks and shoes with less pain.    Time 6   Period Weeks   Status New                Plan - 05/07/17 1435    Clinical Impression Statement Pt is pleasant 71 YO M who presents to OPPT with c/o chronic LBP and difficulty performing ADLs. Pt has had multiple bouts of therapy in the past with some success, but he states that his pain always seems to come back during the cold, winter months. Pt currently presents to therapy with deficits in lumbar AROM, HS and piriformis flexibility, proximal hip strength, functional BLE strength, balance, gait, and overall bed mobility. Pt also had increased soft tissue restrictions of lumbar paraspinals and glutes with tenderness to palpation, R>L. Pt needs skilled  PT intervention to address these deficits in order to maximize overall function at home and in the community.    History and Personal Factors relevant to plan of care: chronicity of issue, multiple bouts of therapy in the past with some success, arthritis, atrial fibrillation, bipolar disorder, COPD, hyperlipidemia, HTN, hypothyroidism   Clinical Presentation Stable   Clinical Presentation due to: lumbar AROM, HS and piriformis flexibility, proximal hip strength, functional BLE strength, balance, gait, and bed mobility, increased soft tissue restrictions of lumbar paraspinals and glutes with tenderness to palpation, R>L.   Clinical Decision Making Low   Rehab Potential Fair   PT Frequency 2x / week   PT Duration 6 weeks   PT Treatment/Interventions ADLs/Self Care Home Management;Cryotherapy;Electrical Stimulation;Moist Heat;Traction;Ultrasound;DME Instruction;Gait training;Stair training;Functional mobility training;Therapeutic activities;Therapeutic exercise;Balance  training;Neuromuscular re-education;Patient/family education;Manual techniques;Passive range of motion;Dry needling;Energy conservation;Taping   PT Next Visit Plan review goals and HEP, perform 3MWT and DGI; functional BLE and core strengthening, SKTC, DKTC, can try decompression 1-5, piriformis stretching, manual for soft tissue restrictions   PT Home Exercise Plan eval: HS stretch, STS   Consulted and Agree with Plan of Care Patient      Patient will benefit from skilled therapeutic intervention in order to improve the following deficits and impairments:  Abnormal gait, Decreased activity tolerance, Decreased balance, Decreased endurance, Decreased mobility, Decreased range of motion, Decreased strength, Difficulty walking, Hypomobility, Increased fascial restricitons, Increased muscle spasms, Impaired flexibility, Improper body mechanics, Postural dysfunction, Pain, Obesity  Visit Diagnosis: Chronic midline low back pain without sciatica - Plan: PT plan of care cert/re-cert  Difficulty in walking, not elsewhere classified - Plan: PT plan of care cert/re-cert  Other symptoms and signs involving the musculoskeletal system - Plan: PT plan of care cert/re-cert  Muscle weakness (generalized) - Plan: PT plan of care cert/re-cert      G-Codes - 59/56/38 1457    Functional Assessment Tool Used (Outpatient Only) FOTO, clinical judgement, 5xSTS, SLS, gait, MMT   Functional Limitation Mobility: Walking and moving around   Mobility: Walking and Moving Around Current Status ([id number]) At least 40 percent but less than 60 percent impaired, limited or restricted   Mobility: Walking and Moving Around Goal Status [phone]) At least 20 percent but less than 40 percent impaired, limited or restricted       Problem List Patient Active Problem List   Diagnosis Date Noted  . Chronic anticoagulation 05/05/2017  . Sleep apnea 05/05/2017  . Edema leg 05/05/2017  . Dysphasia 02/07/2017  . Mild obesity  05/10/2016  . History of colonic polyps   . Diverticulosis of colon without hemorrhage   . Hyperlipidemia LDL goal <130 05/16/2013  . KNEE PAIN 08/07/2009  . KNEE SPRAIN, ACUTE 08/07/2009  . COLONIC POLYPS, HX OF 05/10/2009  . HYPOTHYROIDISM 05/08/2009  . History of bipolar disorder 05/08/2009  . Essential hypertension 05/08/2009  . Permanent atrial fibrillation ([place]) 05/08/2009  . HEMATOCHEZIA 05/08/2009  . LOW BACK PAIN, CHRONIC 05/08/2009  . ABDOMINAL PAIN 05/08/2009         Geraldine Solar PT, DPT   [place] [address] [place], [place], [address] Phone: [phone]   Fax:  [phone]  Name: Casey [name] MRN: [id number] Date of Birth: Oct 10, 1945

## 2017-05-07 NOTE — Patient Instructions (Signed)
  HAMSTRING STRETCH WITH TOWEL  While lying down on your back, hook a towel or strap under  your foot and draw up your leg until a stretch is felt under your leg. calf area.  Keep your knee in a straightened position during the stretch. Will feel a stretch in the back of your legs. If you feel it in your lower back, lower your leg down a little bit  Perform 1-2x/day, 3-5 stretches holding for 30-60 seconds     SIT TO STAND - NO SUPPORT  Start by scooting close to the front of the chair.  Next, lean forward at your trunk and reach forward with your arms and rise to standing without using your hands to push off from the chair or other object.   Use your arms as a counter-balance by reaching forward when in sitting and lower them as you approach standing.   Perform 1-2x/day, 2-3 sets of 10 reps

## 2017-05-08 ENCOUNTER — Encounter (HOSPITAL_COMMUNITY): Payer: Self-pay

## 2017-05-08 ENCOUNTER — Ambulatory Visit (HOSPITAL_COMMUNITY): Payer: Medicare Other

## 2017-05-08 DIAGNOSIS — R29898 Other symptoms and signs involving the musculoskeletal system: Secondary | ICD-10-CM | POA: Diagnosis not present

## 2017-05-08 DIAGNOSIS — M545 Low back pain: Secondary | ICD-10-CM | POA: Diagnosis not present

## 2017-05-08 DIAGNOSIS — G8929 Other chronic pain: Secondary | ICD-10-CM

## 2017-05-08 DIAGNOSIS — M6281 Muscle weakness (generalized): Secondary | ICD-10-CM

## 2017-05-08 DIAGNOSIS — R262 Difficulty in walking, not elsewhere classified: Secondary | ICD-10-CM

## 2017-05-08 NOTE — Patient Instructions (Signed)
  SINGLE KNEE TO CHEST STRETCH - SKTC  While lying on your back, use your hands and gently draw up a knee towards your chest.   Keep your other knee straight and lying on the ground   DOUBLE KNEE TO CHEST STRETCH - DKTC  While Lying on your back,  hold your knees and gently pull them up towards your chest.     LOWER TRUNK ROTATIONS - LTR  Lying on your back with your knees bent, gently move your knees side-to-side.    POSTERIOR PELVIC TILT  Lie on your back on a firm surface with knees comfortably bent (top picture).  Then flatten back against the table while contracting abdominal muscles as if pulling belly button toward ribs (bottom picture).

## 2017-05-08 NOTE — Therapy (Signed)
[place], [place], [address] Phone: [phone]   Fax:  [phone]  Physical Therapy Treatment  Patient Details  Name: Casey [name] MRN: [id number] Date of Birth: 1945-08-14 Referring Provider: Glenna Fellows, MD  Encounter Date: 05/08/2017      PT End of Session - 05/08/17 0900    Visit Number 2   Number of Visits 13   Date for PT Re-Evaluation 05/28/17   Authorization Type Medicare Part A and B   Authorization Time Period 05/07/17 to 06/18/17   Authorization - Visit Number 2   Authorization - Number of Visits 10   PT Start Time 0815   PT Stop Time 3382   PT Time Calculation (min) 40 min   Activity Tolerance Patient tolerated treatment well   Behavior During Therapy [facility] for tasks assessed/performed      Past Medical History:  Diagnosis Date  . Arthritis   . Atrial fibrillation ([place]) 01/12/2010   2D Echo EF=>55%  . Bipolar disorder ([place])   . Chronic back pain   . COPD (chronic obstructive pulmonary disease) ([place])   . Depression   . Dysrhythmia    AFib  . History of colonic polyps   . History of gout   . Hyperlipidemia   . Hypertension   . Hypothyroidism   . Morbid obesity ([place])   . OSA (obstructive sleep apnea)    AHI was 27.62hr RDI was 34.3 hr REM 0.00hr; had CPAP years ago but no longer uses.  . Prostatitis   . Tubular adenoma of colon 01/2016    Past Surgical History:  Procedure Laterality Date  . APPENDECTOMY  1959  . asthma    . CATARACT EXTRACTION Bilateral 1995  . COLONOSCOPY  2011   RMR: left-sided diverticula, minimal rectal friability. No polyps. Repeat 5 years.  . COLONOSCOPY WITH PROPOFOL N/A 01/15/2016   Dr.Rourk- diverticulosis in the entire examined colon, multiple rectal and colonic polyps, internal hemorrhoids. bx= tubular adenomas and hyperplastic polyps.   Marland Kitchen HAND / FINGER LESION EXCISION Right   . POLYPECTOMY  01/15/2016   Procedure: POLYPECTOMY;  Surgeon: Daneil Dolin,  MD;  [facility];  Service: Endoscopy;;  . right shoulder surgery    . TONSILLECTOMY  1955    There were no vitals filed for this visit.      Subjective Assessment - 05/08/17 0806    Subjective Patient notes he is feeling pretty good and went for a walk this morning. Patient notes that some exercises were not so great secondary to increased difficulty and "stretching" pain.    Currently in Pain? No/denies            [facility] PT Assessment - 05/08/17 0001      Dynamic Gait Index   DGI comment: 9/24 increased fall risk                     [facility] Adult PT Treatment/Exercise - 05/08/17 0001      Lumbar Exercises: Stretches   Active Hamstring Stretch 20 seconds;3 reps   Single Knee to Chest Stretch Limitations x 5 each   Double Knee to Chest Stretch Limitations x 10 reps   Lower Trunk Rotation Limitations 10 each     Lumbar Exercises: Supine   Ab Set 10 reps   AB Set Limitations pelvic tilt     Knee/Hip Exercises: Seated   Sit to Sand 5 reps  PT Education - 05/08/17 0859    Education provided Yes   Education Details addition to HEP, reviewed initial eval and original HEP   Person(s) Educated Patient   Methods Explanation;Handout   Comprehension Verbalized understanding          PT Short Term Goals - 05/07/17 1444      PT SHORT TERM GOAL #1   Title Pt will be independent with HEP and perform consistently to maximize overall function and decrease pain.    Time 3   Period Weeks   Status New   Target Date 05/28/17     PT SHORT TERM GOAL #2   Title Pt will have 5/5 MMT in bil hip abd to maximize gait and balance.    Time 3   Period Weeks   Status New     PT SHORT TERM GOAL #3   Title Pt will be modified independent with log rolling to decrease his LBP and maximize his bed mobility.   Time 3   Period Weeks   Status New           PT Long Term Goals - 05/07/17 1447      PT LONG TERM GOAL #1   Title Pt will  be able to perform 5xSTS in 18 sec or < to demo improved functional BLE strength.    Time 6   Period Weeks   Status New   Target Date 06/18/17     PT LONG TERM GOAL #2   Title Pt will be able to perform SLS on BLE for 10 sec with no UE support in order to maximize gait and decrease risk for falls.    Time 6   Period Weeks   Status New     PT LONG TERM GOAL #3   Title Pt will score a 20/24 on the DGI to demo improved balance and decrease risk for falls.   Time 6   Period Weeks   Status New     PT LONG TERM GOAL #4   Title Pt will have improved 3MWT by 196ft or > to demo improved overall tolerance to movement and maximize community access.    Time 6   Period Weeks   Status New     PT LONG TERM GOAL #5   Title Pt will have improved lumbar flexion AROM by at least 25% to decrease pain and maximize pt's ability to put on his socks and shoes with less pain.    Time 6   Period Weeks   Status New               Plan - 05/08/17 0900    Clinical Impression Statement Pt tolerated session well today. Continued complaints of irritation with LBP in supine with hip flexion secondary to core weakness. Significant challenge with posterior pelvic tilt for light lumbar mobility exercise. DGI completed with impaired balance noted by score of 9. Slow gait cadence without cane during testing and small steps during obstacles. Overall, tolerated session well with no increase in symptoms EOS.    Rehab Potential Fair   PT Frequency 2x / week   PT Duration 6 weeks   PT Treatment/Interventions ADLs/Self Care Home Management;Cryotherapy;Electrical Stimulation;Moist Heat;Traction;Ultrasound;DME Instruction;Gait training;Stair training;Functional mobility training;Therapeutic activities;Therapeutic exercise;Balance training;Neuromuscular re-education;Patient/family education;Manual techniques;Passive range of motion;Dry needling;Energy conservation;Taping   PT Next Visit Plan perform 3MWT; functional  BLE and core strengthening, can try decompression 1-5, piriformis stretching, manual for soft tissue restrictions   PT Home Exercise  Plan eval: HS stretch, STS; 05/08/2017 - LTR, SKTC, DKTC, pelvic tilts    Consulted and Agree with Plan of Care Patient      Patient will benefit from skilled therapeutic intervention in order to improve the following deficits and impairments:  Abnormal gait, Decreased activity tolerance, Decreased balance, Decreased endurance, Decreased mobility, Decreased range of motion, Decreased strength, Difficulty walking, Hypomobility, Increased fascial restricitons, Increased muscle spasms, Impaired flexibility, Improper body mechanics, Postural dysfunction, Pain, Obesity  Visit Diagnosis: Chronic midline low back pain without sciatica  Difficulty in walking, not elsewhere classified  Other symptoms and signs involving the musculoskeletal system  Muscle weakness (generalized)       G-Codes - May 29, 2017 1457    Functional Assessment Tool Used (Outpatient Only) FOTO, clinical judgement, 5xSTS, SLS, gait, MMT   Functional Limitation Mobility: Walking and moving around   Mobility: Walking and Moving Around Current Status [phone]) At least 40 percent but less than 60 percent impaired, limited or restricted   Mobility: Walking and Moving Around Goal Status [phone]) At least 20 percent but less than 40 percent impaired, limited or restricted      Problem List Patient Active Problem List   Diagnosis Date Noted  . Chronic anticoagulation 05/05/2017  . Sleep apnea 05/05/2017  . Edema leg 05/05/2017  . Dysphasia 02/07/2017  . Mild obesity 05/10/2016  . History of colonic polyps   . Diverticulosis of colon without hemorrhage   . Hyperlipidemia LDL goal <130 05/16/2013  . KNEE PAIN 08/07/2009  . KNEE SPRAIN, ACUTE 08/07/2009  . COLONIC POLYPS, HX OF 05/10/2009  . HYPOTHYROIDISM 05/08/2009  . History of bipolar disorder 05/08/2009  . Essential hypertension 05/08/2009   . Permanent atrial fibrillation ([place]) 05/08/2009  . HEMATOCHEZIA 05/08/2009  . LOW BACK PAIN, CHRONIC 05/08/2009  . ABDOMINAL PAIN 05/08/2009   Starr Lake PT, DPT 11:25 AM, 05/08/17 [place], [place], [address] Phone: [phone]   Fax:  [phone]  Name: Casey [name] MRN: [id number] Date of Birth: [birth year]

## 2017-05-13 ENCOUNTER — Ambulatory Visit (HOSPITAL_COMMUNITY): Payer: Medicare Other | Admitting: Physical Therapy

## 2017-05-13 DIAGNOSIS — M545 Low back pain, unspecified: Secondary | ICD-10-CM

## 2017-05-13 DIAGNOSIS — F319 Bipolar disorder, unspecified: Secondary | ICD-10-CM | POA: Diagnosis not present

## 2017-05-13 DIAGNOSIS — R29898 Other symptoms and signs involving the musculoskeletal system: Secondary | ICD-10-CM

## 2017-05-13 DIAGNOSIS — G8929 Other chronic pain: Secondary | ICD-10-CM | POA: Diagnosis not present

## 2017-05-13 DIAGNOSIS — R262 Difficulty in walking, not elsewhere classified: Secondary | ICD-10-CM | POA: Diagnosis not present

## 2017-05-13 DIAGNOSIS — M6281 Muscle weakness (generalized): Secondary | ICD-10-CM

## 2017-05-13 NOTE — Therapy (Signed)
[place], [place], [address] Phone: [phone]   Fax:  [phone]  Physical Therapy Treatment  Patient Details  Name: Casey [name] MRN: [id number] Date of Birth: 06-23-1946 Referring Provider: Glenna Fellows, MD  Encounter Date: 05/13/2017      PT End of Session - 05/13/17 1648    Visit Number 3   Number of Visits 13   Date for PT Re-Evaluation 05/28/17   Authorization Type Medicare Part A and B   Authorization Time Period 05/07/17 to 06/18/17   Authorization - Visit Number 3   Authorization - Number of Visits 10   PT Start Time 1600   PT Stop Time 1648   PT Time Calculation (min) 48 min   Activity Tolerance Patient tolerated treatment well   Behavior During Therapy [facility] for tasks assessed/performed      Past Medical History:  Diagnosis Date  . Arthritis   . Atrial fibrillation ([place]) 01/12/2010   2D Echo EF=>55%  . Bipolar disorder ([place])   . Chronic back pain   . COPD (chronic obstructive pulmonary disease) ([place])   . Depression   . Dysrhythmia    AFib  . History of colonic polyps   . History of gout   . Hyperlipidemia   . Hypertension   . Hypothyroidism   . Morbid obesity ([place])   . OSA (obstructive sleep apnea)    AHI was 27.62hr RDI was 34.3 hr REM 0.00hr; had CPAP years ago but no longer uses.  . Prostatitis   . Tubular adenoma of colon 01/2016    Past Surgical History:  Procedure Laterality Date  . APPENDECTOMY  1959  . asthma    . CATARACT EXTRACTION Bilateral 1995  . COLONOSCOPY  2011   RMR: left-sided diverticula, minimal rectal friability. No polyps. Repeat 5 years.  . COLONOSCOPY WITH PROPOFOL N/A 01/15/2016   Dr.Rourk- diverticulosis in the entire examined colon, multiple rectal and colonic polyps, internal hemorrhoids. bx= tubular adenomas and hyperplastic polyps.   Marland Kitchen HAND / FINGER LESION EXCISION Right   . POLYPECTOMY  01/15/2016   Procedure: POLYPECTOMY;  Surgeon: Daneil Dolin,  MD;  [facility];  Service: Endoscopy;;  . right shoulder surgery    . TONSILLECTOMY  1955    There were no vitals filed for this visit.      Subjective Assessment - 05/13/17 1602    Subjective Pt states he's been having issues with his meds the past month that have been disturbing his sleep.   Currently without pain.   Currently in Pain? No/denies                         [facility] Adult PT Treatment/Exercise - 05/13/17 0001      Lumbar Exercises: Stretches   Active Hamstring Stretch 20 seconds;3 reps   Single Knee to Chest Stretch Limitations x 5 each   Lower Trunk Rotation Limitations 10 each   Piriformis Stretch 3 reps;20 seconds   Piriformis Stretch Limitations supine     Lumbar Exercises: Supine   Ab Set 10 reps   AB Set Limitations pelvic tilt   Other Supine Lumbar Exercises decompression 1-5     Knee/Hip Exercises: Seated   Sit to Sand 10 reps;without UE support                PT Education - 05/13/17 1655    Education provided Yes   Education Details  Discussed current medical complications/sleep disturbances affecting his exercise performance.  Updated HEP with decompression exercises and given writeen instruxtion.   Person(s) Educated Patient   Methods Explanation;Demonstration;Tactile cues;Verbal cues;Handout   Comprehension Verbalized understanding;Returned demonstration;Verbal cues required;Tactile cues required          PT Short Term Goals - 05/07/17 1444      PT SHORT TERM GOAL #1   Title Pt will be independent with HEP and perform consistently to maximize overall function and decrease pain.    Time 3   Period Weeks   Status New   Target Date 05/28/17     PT SHORT TERM GOAL #2   Title Pt will have 5/5 MMT in bil hip abd to maximize gait and balance.    Time 3   Period Weeks   Status New     PT SHORT TERM GOAL #3   Title Pt will be modified independent with log rolling to decrease his LBP and maximize his bed  mobility.   Time 3   Period Weeks   Status New           PT Long Term Goals - 05/07/17 1447      PT LONG TERM GOAL #1   Title Pt will be able to perform 5xSTS in 18 sec or < to demo improved functional BLE strength.    Time 6   Period Weeks   Status New   Target Date 06/18/17     PT LONG TERM GOAL #2   Title Pt will be able to perform SLS on BLE for 10 sec with no UE support in order to maximize gait and decrease risk for falls.    Time 6   Period Weeks   Status New     PT LONG TERM GOAL #3   Title Pt will score a 20/24 on the DGI to demo improved balance and decrease risk for falls.   Time 6   Period Weeks   Status New     PT LONG TERM GOAL #4   Title Pt will have improved 3MWT by 136ft or > to demo improved overall tolerance to movement and maximize community access.    Time 6   Period Weeks   Status New     PT LONG TERM GOAL #5   Title Pt will have improved lumbar flexion AROM by at least 25% to decrease pain and maximize pt's ability to put on his socks and shoes with less pain.    Time 6   Period Weeks   Status New               Plan - 05/13/17 1649    Clinical Impression Statement  Pt without pain. Remained very figgityand talkative throughout session, requireing redirection to remain on task with exercises.  unable to complete 3MWT this session following additional exercises and education.  Pt able to recall and demonstrate logroll technique independently this session.  Added piriformis stretch in supine as too tightn to complete in seated position.   Rehab Potential Fair   PT Frequency 2x / week   PT Duration 6 weeks   PT Treatment/Interventions ADLs/Self Care Home Management;Cryotherapy;Electrical Stimulation;Moist Heat;Traction;Ultrasound;DME Instruction;Gait training;Stair training;Functional mobility training;Therapeutic activities;Therapeutic exercise;Balance training;Neuromuscular re-education;Patient/family education;Manual techniques;Passive  range of motion;Dry needling;Energy conservation;Taping   PT Next Visit Plan Next session complete 3MWT.  Progress functional BLE and core strengthening. Review decompression exercises to ensure proper form.   PT Home Exercise Plan eval: HS stretch, STS; 05/08/2017 -  LTR, SKTC, DKTC, pelvic tilts    Consulted and Agree with Plan of Care Patient      Patient will benefit from skilled therapeutic intervention in order to improve the following deficits and impairments:  Abnormal gait, Decreased activity tolerance, Decreased balance, Decreased endurance, Decreased mobility, Decreased range of motion, Decreased strength, Difficulty walking, Hypomobility, Increased fascial restricitons, Increased muscle spasms, Impaired flexibility, Improper body mechanics, Postural dysfunction, Pain, Obesity  Visit Diagnosis: Chronic midline low back pain without sciatica  Difficulty in walking, not elsewhere classified  Other symptoms and signs involving the musculoskeletal system  Muscle weakness (generalized)     Problem List Patient Active Problem List   Diagnosis Date Noted  . Chronic anticoagulation 05/05/2017  . Sleep apnea 05/05/2017  . Edema leg 05/05/2017  . Dysphasia 02/07/2017  . Mild obesity 05/10/2016  . History of colonic polyps   . Diverticulosis of colon without hemorrhage   . Hyperlipidemia LDL goal <130 05/16/2013  . KNEE PAIN 08/07/2009  . KNEE SPRAIN, ACUTE 08/07/2009  . COLONIC POLYPS, HX OF 05/10/2009  . HYPOTHYROIDISM 05/08/2009  . History of bipolar disorder 05/08/2009  . Essential hypertension 05/08/2009  . Permanent atrial fibrillation ([place]) 05/08/2009  . HEMATOCHEZIA 05/08/2009  . LOW BACK PAIN, CHRONIC 05/08/2009  . ABDOMINAL PAIN 05/08/2009   Teena Irani, PTA/CLT [phone]  Teena Irani 05/13/2017, 4:57 PM  [place], [place], [address] Phone: [phone]   Fax:  [phone]  Name:  Casey [name] MRN: [id number] Date of Birth: 08/28/1945

## 2017-05-15 ENCOUNTER — Ambulatory Visit (HOSPITAL_COMMUNITY): Payer: Medicare Other | Attending: Neurosurgery

## 2017-05-15 DIAGNOSIS — G8929 Other chronic pain: Secondary | ICD-10-CM

## 2017-05-15 DIAGNOSIS — M6281 Muscle weakness (generalized): Secondary | ICD-10-CM | POA: Insufficient documentation

## 2017-05-15 DIAGNOSIS — R29898 Other symptoms and signs involving the musculoskeletal system: Secondary | ICD-10-CM

## 2017-05-15 DIAGNOSIS — R262 Difficulty in walking, not elsewhere classified: Secondary | ICD-10-CM | POA: Insufficient documentation

## 2017-05-15 DIAGNOSIS — M545 Low back pain: Secondary | ICD-10-CM | POA: Diagnosis not present

## 2017-05-15 NOTE — Therapy (Addendum)
[place], [place], [address] Phone: [phone]   Fax:  [phone]  Physical Therapy Treatment  Patient Details  Name: Casey [name] MRN: [id number] Date of Birth: 11-12-1945 Referring Provider: Glenna Fellows, MD  Encounter Date: 05/15/2017      PT End of Session - 05/15/17 1050    Visit Number 4   Number of Visits 13   Date for PT Re-Evaluation 05/28/17   Authorization Type Medicare Part A and B   Authorization Time Period 05/07/17 to 06/18/17   Authorization - Visit Number 4   Authorization - Number of Visits 10   PT Start Time 1443   PT Stop Time 1115   PT Time Calculation (min) 38 min   Activity Tolerance Patient tolerated treatment well;No increased pain   Behavior During Therapy WFL for tasks assessed/performed      Past Medical History:  Diagnosis Date  . Arthritis   . Atrial fibrillation ([place]) 01/12/2010   2D Echo EF=>55%  . Bipolar disorder ([place])   . Chronic back pain   . COPD (chronic obstructive pulmonary disease) ([place])   . Depression   . Dysrhythmia    AFib  . History of colonic polyps   . History of gout   . Hyperlipidemia   . Hypertension   . Hypothyroidism   . Morbid obesity ([place])   . OSA (obstructive sleep apnea)    AHI was 27.62hr RDI was 34.3 hr REM 0.00hr; had CPAP years ago but no longer uses.  . Prostatitis   . Tubular adenoma of colon 01/2016    Past Surgical History:  Procedure Laterality Date  . APPENDECTOMY  1959  . asthma    . CATARACT EXTRACTION Bilateral 1995  . COLONOSCOPY  2011   RMR: left-sided diverticula, minimal rectal friability. No polyps. Repeat 5 years.  . COLONOSCOPY WITH PROPOFOL N/A 01/15/2016   Dr.Rourk- diverticulosis in the entire examined colon, multiple rectal and colonic polyps, internal hemorrhoids. bx= tubular adenomas and hyperplastic polyps.   Marland Kitchen HAND / FINGER LESION EXCISION Right   . POLYPECTOMY  01/15/2016   Procedure: POLYPECTOMY;  Surgeon:  Daneil Dolin, MD;  [facility];  Service: Endoscopy;;  . right shoulder surgery    . TONSILLECTOMY  1955    There were no vitals filed for this visit.      Subjective Assessment - 05/15/17 1038    Subjective Last PT session, pt felt pretty good. Pain has been well controlled. Pt does well with his HEP lying down, the seated HEP items take longer and greater effort.                          [place] Adult PT Treatment/Exercise - 05/15/17 0001      Ambulation/Gait   Ambulation Distance (Feet) 627 Feet  3MWT   Gait velocity 1.50m/s     Lumbar Exercises: Stretches   Active Hamstring Stretch 3 reps;30 seconds   Single Knee to Chest Stretch Limitations 5x10seconds   staqnding, lunge stretch on box    Double Knee to Chest Stretch Limitations 10x10sec seated in chair   Lower Trunk Rotation Limitations 2x30sec   chair rotation stretch    Piriformis Stretch 2 reps;30 seconds   Piriformis Stretch Limitations supine     Lumbar Exercises: Seated   Hip Flexion on Ball Strengthening;Both;20 reps   Sit to Stand Other (comment)  2x10, front loaded, slghtly elevated surface  Lumbar Exercises: Supine   Bridge --  glute max bridge: 3x10                   PT Short Term Goals - 05/07/17 1444      PT SHORT TERM GOAL #1   Title Pt will be independent with HEP and perform consistently to maximize overall function and decrease pain.    Time 3   Period Weeks   Status New   Target Date 05/28/17     PT SHORT TERM GOAL #2   Title Pt will have 5/5 MMT in bil hip abd to maximize gait and balance.    Time 3   Period Weeks   Status New     PT SHORT TERM GOAL #3   Title Pt will be modified independent with log rolling to decrease his LBP and maximize his bed mobility.   Time 3   Period Weeks   Status New           PT Long Term Goals - 05/07/17 1447      PT LONG TERM GOAL #1   Title Pt will be able to perform 5xSTS in 18 sec or < to demo  improved functional BLE strength.    Time 6   Period Weeks   Status New   Target Date 06/18/17     PT LONG TERM GOAL #2   Title Pt will be able to perform SLS on BLE for 10 sec with no UE support in order to maximize gait and decrease risk for falls.    Time 6   Period Weeks   Status New     PT LONG TERM GOAL #3   Title Pt will score a 20/24 on the DGI to demo improved balance and decrease risk for falls.   Time 6   Period Weeks   Status New     PT LONG TERM GOAL #4   Title Pt will have improved 3MWT by 113ft or > to demo improved overall tolerance to movement and maximize community access.    Time 6   Period Weeks   Status New     PT LONG TERM GOAL #5   Title Pt will have improved lumbar flexion AROM by at least 25% to decrease pain and maximize pt's ability to put on his socks and shoes with less pain.    Time 6   Period Weeks   Status New               Plan - 05/15/17 1053    Clinical Impression Statement Pt toleratign session well, no increased pain. Continued to address lumbar spine mobility deficits, as well as tightness in hip musculature. Patient remains lordotic throughout. Right hip remains tighter than left subjectively. Moved front loaded STS to goblet style squat for less shoulder strain   PT Frequency 2x / week   PT Duration 6 weeks   PT Treatment/Interventions ADLs/Self Care Home Management;Cryotherapy;Electrical Stimulation;Moist Heat;Traction;Ultrasound;DME Instruction;Gait training;Stair training;Functional mobility training;Therapeutic activities;Therapeutic exercise;Balance training;Neuromuscular re-education;Patient/family education;Manual techniques;Passive range of motion;Dry needling;Energy conservation;Taping   PT Next Visit Plan Continue to address lumbar spine hypomobility, improving lumbar flexion, strength in hips, and mobility in hip soft tissue.    PT Home Exercise Plan eval: HS stretch, STS; 05/08/2017 - LTR, SKTC, DKTC, pelvic tilts     Consulted and Agree with Plan of Care Patient      Patient will benefit from skilled therapeutic intervention in order to improve the following  deficits and impairments:  Abnormal gait, Decreased activity tolerance, Decreased balance, Decreased endurance, Decreased mobility, Decreased range of motion, Decreased strength, Difficulty walking, Hypomobility, Increased fascial restricitons, Increased muscle spasms, Impaired flexibility, Improper body mechanics, Postural dysfunction, Pain, Obesity  Visit Diagnosis: Chronic midline low back pain without sciatica  Difficulty in walking, not elsewhere classified  Other symptoms and signs involving the musculoskeletal system  Muscle weakness (generalized)     Problem List Patient Active Problem List   Diagnosis Date Noted  . Chronic anticoagulation 05/05/2017  . Sleep apnea 05/05/2017  . Edema leg 05/05/2017  . Dysphasia 02/07/2017  . Mild obesity 05/10/2016  . History of colonic polyps   . Diverticulosis of colon without hemorrhage   . Hyperlipidemia LDL goal <130 05/16/2013  . KNEE PAIN 08/07/2009  . KNEE SPRAIN, ACUTE 08/07/2009  . COLONIC POLYPS, HX OF 05/10/2009  . HYPOTHYROIDISM 05/08/2009  . History of bipolar disorder 05/08/2009  . Essential hypertension 05/08/2009  . Permanent atrial fibrillation ([place]) 05/08/2009  . HEMATOCHEZIA 05/08/2009  . LOW BACK PAIN, CHRONIC 05/08/2009  . ABDOMINAL PAIN 05/08/2009   11:18 AM, 05/15/17 Etta Grandchild, PT, DPT Physical Therapist at [place] [phone] (office)      Etta Grandchild 05/15/2017, 11:18 AM  [place], [place], [address] Phone: [phone]   Fax:  [phone]  Name: Casey [name] MRN: [id number] Date of Birth: Jan 20, 1946

## 2017-05-20 ENCOUNTER — Ambulatory Visit (HOSPITAL_COMMUNITY): Payer: Medicare Other

## 2017-05-20 DIAGNOSIS — R29898 Other symptoms and signs involving the musculoskeletal system: Secondary | ICD-10-CM

## 2017-05-20 DIAGNOSIS — G8929 Other chronic pain: Secondary | ICD-10-CM | POA: Diagnosis not present

## 2017-05-20 DIAGNOSIS — R262 Difficulty in walking, not elsewhere classified: Secondary | ICD-10-CM | POA: Diagnosis not present

## 2017-05-20 DIAGNOSIS — M6281 Muscle weakness (generalized): Secondary | ICD-10-CM | POA: Diagnosis not present

## 2017-05-20 DIAGNOSIS — M545 Low back pain, unspecified: Secondary | ICD-10-CM

## 2017-05-20 NOTE — Therapy (Signed)
[place], [place], [address] Phone: [phone]   Fax:  [phone]  Physical Therapy Treatment  Patient Details  Name: Casey [name] MRN: [id number] Date of Birth: 10-May-1946 Referring Provider: Glenna Fellows, MD   Encounter Date: 05/20/2017  PT End of Session - 05/20/17 1050    Visit Number  5    Number of Visits  13    Date for PT Re-Evaluation  05/28/17    Authorization Type  Medicare Part A and B    Authorization Time Period  05/07/17 to 06/18/17    Authorization - Visit Number  5    Authorization - Number of Visits  10    PT Start Time  1034    PT Stop Time  1112    PT Time Calculation (min)  38 min    Activity Tolerance  Patient tolerated treatment well;No increased pain    Behavior During Therapy  WFL for tasks assessed/performed       Past Medical History:  Diagnosis Date  . Arthritis   . Atrial fibrillation ([place]) 01/12/2010   2D Echo EF=>55%  . Bipolar disorder ([place])   . Chronic back pain   . COPD (chronic obstructive pulmonary disease) ([place])   . Depression   . Dysrhythmia    AFib  . History of colonic polyps   . History of gout   . Hyperlipidemia   . Hypertension   . Hypothyroidism   . Morbid obesity ([place])   . OSA (obstructive sleep apnea)    AHI was 27.62hr RDI was 34.3 hr REM 0.00hr; had CPAP years ago but no longer uses.  . Prostatitis   . Tubular adenoma of colon 01/2016    Past Surgical History:  Procedure Laterality Date  . APPENDECTOMY  1959  . asthma    . CATARACT EXTRACTION Bilateral 1995  . COLONOSCOPY  2011   RMR: left-sided diverticula, minimal rectal friability. No polyps. Repeat 5 years.  Marland Kitchen HAND / FINGER LESION EXCISION Right   . right shoulder surgery    . TONSILLECTOMY  1955    There were no vitals filed for this visit.  Subjective Assessment - 05/20/17 1036    Subjective  Patient reports he felt great after last session.  Stiffness has settled in today whiel  working th epolls. He thinks its weather related.      Currently in Pain?  No/denies just stiffness.    just stiffness.                      [facility] Adult PT Treatment/Exercise - 05/20/17 0001      Ambulation/Gait   Ambulation Distance (Feet)  225 Feet    Gait velocity  1.82m/s      Lumbar Exercises: Stretches   Active Hamstring Stretch  3 reps;30 seconds    Single Knee to Chest Stretch Limitations  3x30 seconds  staqnding, lunge stretch on box    staqnding, lunge stretch on box    Double Knee to Chest Stretch Limitations  10x10sec seated in chair    Lower Trunk Rotation  -- standing rotation with physioball: 15x bilat alteranting    standing rotation with physioball: 15x bilat alteranting    Hip Flexor Stretch  3 reps;30 seconds modified sumo squat stretchh; feet on 4" box   modified sumo squat stretchh; feet on 4" box   Piriformis Stretch  2 reps;30 seconds    Piriformis Stretch Limitations  seated  Lumbar Exercises: Standing   Other Standing Lumbar Exercises  SLS: in high knee marching. 2x20x3sec hold //bars   2x20x3sec hold //bars     Lumbar Exercises: Seated   Sit to Stand  Other (comment) 2x10, front loaded (10lb), chair + airex   2x10, front loaded (10lb), chair + airex     Lumbar Exercises: Supine   Other Supine Lumbar Exercises  Reverese Curl-up: 2x12  A/ROM (lumbar flexion), and trunk flexor strengthening   A/ROM (lumbar flexion), and trunk flexor strengthening   Other Supine Lumbar Exercises  Supine horizontal abduction, feet together:  10x10sec for groin stretch, neurological inhibition.    10x10sec for groin stretch, neurological inhibition.      Manual Therapy   Manual Therapy  Myofascial release    Manual therapy comments  MFR to high adductors; supine:  15 minutes    15 minutes               PT Short Term Goals - 05/07/17 1444      PT SHORT TERM GOAL #1   Title  Pt will be independent with HEP and perform consistently to maximize  overall function and decrease pain.     Time  3    Period  Weeks    Status  New    Target Date  05/28/17      PT SHORT TERM GOAL #2   Title  Pt will have 5/5 MMT in bil hip abd to maximize gait and balance.     Time  3    Period  Weeks    Status  New      PT SHORT TERM GOAL #3   Title  Pt will be modified independent with log rolling to decrease his LBP and maximize his bed mobility.    Time  3    Period  Weeks    Status  New        PT Long Term Goals - 05/07/17 1447      PT LONG TERM GOAL #1   Title  Pt will be able to perform 5xSTS in 18 sec or < to demo improved functional BLE strength.     Time  6    Period  Weeks    Status  New    Target Date  06/18/17      PT LONG TERM GOAL #2   Title  Pt will be able to perform SLS on BLE for 10 sec with no UE support in order to maximize gait and decrease risk for falls.     Time  6    Period  Weeks    Status  New      PT LONG TERM GOAL #3   Title  Pt will score a 20/24 on the DGI to demo improved balance and decrease risk for falls.    Time  6    Period  Weeks    Status  New      PT LONG TERM GOAL #4   Title  Pt will have improved 3MWT by 134ft or > to demo improved overall tolerance to movement and maximize community access.     Time  6    Period  Weeks    Status  New      PT LONG TERM GOAL #5   Title  Pt will have improved lumbar flexion AROM by at least 25% to decrease pain and maximize pt's ability to put on his socks and shoes with less pain.  Time  6    Period  Weeks    Status  New            Plan - 05/20/17 1051    Clinical Impression Statement  COntinued with mobility work ad nfunctional strengthening. Severe tightness in adductors bilat continues to limit hip mobility in all planes. Stretches do improve. Rotation moved to standing with good inclussion go CK[facility] hip rotation.  Progress toward goals overall thus far. Hip spasm continues to be most limiting, suspected presentation from LSS. Place big focus  on increasing flexed based activity at home to decrease stenosis and improve lumba rmobility.     Rehab Potential  Fair    PT Frequency  2x / week    PT Duration  6 weeks    PT Treatment/Interventions  ADLs/Self Care Home Management;Cryotherapy;Electrical Stimulation;Moist Heat;Traction;Ultrasound;DME Instruction;Gait training;Stair training;Functional mobility training;Therapeutic activities;Therapeutic exercise;Balance training;Neuromuscular re-education;Patient/family education;Manual techniques;Passive range of motion;Dry needling;Energy conservation;Taping    PT Next Visit Plan  Continue to address lumbar spine hypomobility, improving lumbar flexion, strength in hips, and mobility in hip soft tissue. FU on DKTC 230sec, DKTC 20-25x AROM repeated BID.     PT Home Exercise Plan  eval: HS stretch, STS; 05/08/2017 - LTR, SKTC, DKTC, pelvic tilts     Consulted and Agree with Plan of Care  Patient       Patient will benefit from skilled therapeutic intervention in order to improve the following deficits and impairments:  Abnormal gait, Decreased activity tolerance, Decreased balance, Decreased endurance, Decreased mobility, Decreased range of motion, Decreased strength, Difficulty walking, Hypomobility, Increased fascial restricitons, Increased muscle spasms, Impaired flexibility, Improper body mechanics, Postural dysfunction, Pain, Obesity  Visit Diagnosis: Chronic midline low back pain without sciatica  Difficulty in walking, not elsewhere classified  Other symptoms and signs involving the musculoskeletal system  Muscle weakness (generalized)     Problem List Patient Active Problem List   Diagnosis Date Noted  . Chronic anticoagulation 05/05/2017  . Sleep apnea 05/05/2017  . Edema leg 05/05/2017  . Dysphasia 02/07/2017  . Mild obesity 05/10/2016  . History of colonic polyps   . Diverticulosis of colon without hemorrhage   . Hyperlipidemia LDL goal <130 05/16/2013  . KNEE PAIN  08/07/2009  . KNEE SPRAIN, ACUTE 08/07/2009  . COLONIC POLYPS, HX OF 05/10/2009  . HYPOTHYROIDISM 05/08/2009  . History of bipolar disorder 05/08/2009  . Essential hypertension 05/08/2009  . Permanent atrial fibrillation ([place]) 05/08/2009  . HEMATOCHEZIA 05/08/2009  . LOW BACK PAIN, CHRONIC 05/08/2009  . ABDOMINAL PAIN 05/08/2009   11:19 AM, 05/20/17 Etta Grandchild, PT, DPT Physical Therapist at [place] [phone] (office)      Etta Grandchild 05/20/2017, 11:19 AM  [place], [place], [address] Phone: [phone]   Fax:  [phone]  Name: Casey [name] MRN: [id number] Date of Birth: 14-Oct-1945

## 2017-05-22 ENCOUNTER — Ambulatory Visit (HOSPITAL_COMMUNITY): Payer: Medicare Other

## 2017-05-22 DIAGNOSIS — R262 Difficulty in walking, not elsewhere classified: Secondary | ICD-10-CM

## 2017-05-22 DIAGNOSIS — M545 Low back pain, unspecified: Secondary | ICD-10-CM

## 2017-05-22 DIAGNOSIS — G8929 Other chronic pain: Secondary | ICD-10-CM | POA: Diagnosis not present

## 2017-05-22 DIAGNOSIS — M6281 Muscle weakness (generalized): Secondary | ICD-10-CM

## 2017-05-22 DIAGNOSIS — R29898 Other symptoms and signs involving the musculoskeletal system: Secondary | ICD-10-CM | POA: Diagnosis not present

## 2017-05-22 NOTE — Therapy (Signed)
[place], [place], [address] Phone: [phone]   Fax:  [phone]  Physical Therapy Treatment  Patient Details  Name: Casey [name] MRN: [id number] Date of Birth: Oct 29, 1945 Referring Provider: Glenna Fellows, MD   Encounter Date: 05/22/2017  PT End of Session - 05/22/17 1207    Visit Number  6    Number of Visits  13    Date for PT Re-Evaluation  05/28/17    Authorization Type  Medicare Part A and B    Authorization Time Period  05/07/17 to 06/18/17    Authorization - Visit Number  6    Authorization - Number of Visits  10    PT Start Time  0263    PT Stop Time  1120    PT Time Calculation (min)  40 min    Activity Tolerance  Patient tolerated treatment well;No increased pain    Behavior During Therapy  WFL for tasks assessed/performed       Past Medical History:  Diagnosis Date  . Arthritis   . Atrial fibrillation ([place]) 01/12/2010   2D Echo EF=>55%  . Bipolar disorder ([place])   . Chronic back pain   . COPD (chronic obstructive pulmonary disease) ([place])   . Depression   . Dysrhythmia    AFib  . History of colonic polyps   . History of gout   . Hyperlipidemia   . Hypertension   . Hypothyroidism   . Morbid obesity ([place])   . OSA (obstructive sleep apnea)    AHI was 27.62hr RDI was 34.3 hr REM 0.00hr; had CPAP years ago but no longer uses.  . Prostatitis   . Tubular adenoma of colon 01/2016    Past Surgical History:  Procedure Laterality Date  . APPENDECTOMY  1959  . asthma    . CATARACT EXTRACTION Bilateral 1995  . COLONOSCOPY  2011   RMR: left-sided diverticula, minimal rectal friability. No polyps. Repeat 5 years.  Marland Kitchen HAND / FINGER LESION EXCISION Right   . right shoulder surgery    . TONSILLECTOMY  1955    There were no vitals filed for this visit.  Subjective Assessment - 05/22/17 1041    Subjective  Pt feeling good today. 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 yesterday  he felt fairly good in general, but stayed busy. HEP updates are going well with strong focus on Lumbar FLexion.    Currently in Pain?  No/denies                      [facility] Adult PT Treatment/Exercise - 05/22/17 0001      Ambulation/Gait   Ambulation Distance (Feet)  225 Feet    Gait velocity  1.96m/s      Lumbar Exercises: Stretches   Single Knee to Chest Stretch Limitations  3x30 seconds, bilat supine, P/ROM stretching, + abduction to avoid FAI   supine, P/ROM stretching, + abduction to avoid FAI   Double Knee to Chest Stretch Limitations  15x10sec seated in chair, floor touches    Lower Trunk Rotation Limitations  3x30sec P/ROM stretches with PT in sidelying 1x15 bilat A/ROM in sidelying   1x15 bilat A/ROM in sidelying     Lumbar Exercises: Aerobic   Stationary Bike  NUSTEP x5 minutes for AA/ROM hip flexion, trunk rotation   hip flexion, trunk rotation     Lumbar Exercises: Seated   Sit to Stand  Other (comment)  3x10, front loaded (10lb), chair height   3x10, front loaded (10lb), chair height     Lumbar Exercises: Supine   Bent Knee Raise  -- 3x10 bilat, slight abduction to avoid FAI    3x10 bilat, slight abduction to avoid FAI               PT Short Term Goals - 05/07/17 1444      PT SHORT TERM GOAL #1   Title  Pt will be independent with HEP and perform consistently to maximize overall function and decrease pain.     Time  3    Period  Weeks    Status  New    Target Date  05/28/17      PT SHORT TERM GOAL #2   Title  Pt will have 5/5 MMT in bil hip abd to maximize gait and balance.     Time  3    Period  Weeks    Status  New      PT SHORT TERM GOAL #3   Title  Pt will be modified independent with log rolling to decrease his LBP and maximize his bed mobility.    Time  3    Period  Weeks    Status  New        PT Long Term Goals - 05/07/17 1447      PT LONG TERM GOAL #1   Title  Pt will be able to perform 5xSTS in 18 sec or < to demo  improved functional BLE strength.     Time  6    Period  Weeks    Status  New    Target Date  06/18/17      PT LONG TERM GOAL #2   Title  Pt will be able to perform SLS on BLE for 10 sec with no UE support in order to maximize gait and decrease risk for falls.     Time  6    Period  Weeks    Status  New      PT LONG TERM GOAL #3   Title  Pt will score a 20/24 on the DGI to demo improved balance and decrease risk for falls.    Time  6    Period  Weeks    Status  New      PT LONG TERM GOAL #4   Title  Pt will have improved 3MWT by 119ft or > to demo improved overall tolerance to movement and maximize community access.     Time  6    Period  Weeks    Status  New      PT LONG TERM GOAL #5   Title  Pt will have improved lumbar flexion AROM by at least 25% to decrease pain and maximize pt's ability to put on his socks and shoes with less pain.     Time  6    Period  Weeks    Status  New            Plan - 05/22/17 1207    Clinical Impression Statement  SEssion with continued focus to address lumbar flexion hypomobility, and posterior hip spasticity and tightness. Pt starting to feel more strength in low back. Pt remaisn remarkable rigid throughout. Strenth improving in hips as mobility increase, but trunk flexors remain limited, with clearly visible diastasis recti.     Rehab Potential  Fair    PT Frequency  2x / week  PT Duration  6 weeks    PT Treatment/Interventions  ADLs/Self Care Home Management;Cryotherapy;Electrical Stimulation;Moist Heat;Traction;Ultrasound;DME Instruction;Gait training;Stair training;Functional mobility training;Therapeutic activities;Therapeutic exercise;Balance training;Neuromuscular re-education;Patient/family education;Manual techniques;Passive range of motion;Dry needling;Energy conservation;Taping    PT Next Visit Plan  Continue lumbar spine flexion and hip flexion (mindful of FAI), strength in hips, and mobility in hip soft tissue.     PT Home  Exercise Plan  eval: HS stretch, STS; 05/08/2017 - LTR, SKTC, DKTC, pelvic tilts; DKTC 2x30sec, DKTC 20-25x AROM repeated BID.     Consulted and Agree with Plan of Care  Patient       Patient will benefit from skilled therapeutic intervention in order to improve the following deficits and impairments:  Abnormal gait, Decreased activity tolerance, Decreased balance, Decreased endurance, Decreased mobility, Decreased range of motion, Decreased strength, Difficulty walking, Hypomobility, Increased fascial restricitons, Increased muscle spasms, Impaired flexibility, Improper body mechanics, Postural dysfunction, Pain, Obesity  Visit Diagnosis: Chronic midline low back pain without sciatica  Difficulty in walking, not elsewhere classified  Other symptoms and signs involving the musculoskeletal system  Muscle weakness (generalized)     Problem List Patient Active Problem List   Diagnosis Date Noted  . Chronic anticoagulation 05/05/2017  . Sleep apnea 05/05/2017  . Edema leg 05/05/2017  . Dysphasia 02/07/2017  . Mild obesity 05/10/2016  . History of colonic polyps   . Diverticulosis of colon without hemorrhage   . Hyperlipidemia LDL goal <130 05/16/2013  . KNEE PAIN 08/07/2009  . KNEE SPRAIN, ACUTE 08/07/2009  . COLONIC POLYPS, HX OF 05/10/2009  . HYPOTHYROIDISM 05/08/2009  . History of bipolar disorder 05/08/2009  . Essential hypertension 05/08/2009  . Permanent atrial fibrillation ([place]) 05/08/2009  . HEMATOCHEZIA 05/08/2009  . LOW BACK PAIN, CHRONIC 05/08/2009  . ABDOMINAL PAIN 05/08/2009    Holle Sprick C 05/22/2017, 12:11 PM   12:11 PM, 05/22/17 Etta Grandchild, PT, DPT Physical Therapist at [facility] [phone] (office)      [facility] [address] [place], [place], [address] Phone: [phone]   Fax:  [phone]  Name: Casey [name] MRN: [id number] Date of Birth: Jun 12, 1946

## 2017-05-27 ENCOUNTER — Encounter (HOSPITAL_COMMUNITY): Payer: Self-pay

## 2017-05-27 ENCOUNTER — Ambulatory Visit (HOSPITAL_COMMUNITY): Payer: Medicare Other

## 2017-05-27 DIAGNOSIS — G8929 Other chronic pain: Secondary | ICD-10-CM | POA: Diagnosis not present

## 2017-05-27 DIAGNOSIS — M545 Low back pain: Principal | ICD-10-CM

## 2017-05-27 DIAGNOSIS — M6281 Muscle weakness (generalized): Secondary | ICD-10-CM | POA: Diagnosis not present

## 2017-05-27 DIAGNOSIS — R262 Difficulty in walking, not elsewhere classified: Secondary | ICD-10-CM

## 2017-05-27 DIAGNOSIS — R29898 Other symptoms and signs involving the musculoskeletal system: Secondary | ICD-10-CM

## 2017-05-27 NOTE — Patient Instructions (Signed)
  Seated Lumbar Flexion Stretch  Start sitting on a chair with good posture, reach arms towards the floor until you feel a stretch in your lower back and hold.  Perform 1-2x/day, 3-5 stretches holding for 30-60 seconds each

## 2017-05-27 NOTE — Therapy (Signed)
[place], [place], [address] Phone: [phone]   Fax:  [phone]  Physical Therapy Treatment/Reassessment  Patient Details  Name: Casey [name] MRN: [id number] Date of Birth: 06-11-1946 Referring Provider: Glenna Fellows, MD   Encounter Date: 05/27/2017  PT End of Session - 05/27/17 0903    Visit Number  7    Number of Visits  13    Date for PT Re-Evaluation  06/18/17    Authorization Type  Medicare Part A and B (g codes done on visit #7)    Authorization Time Period  05/07/17 to 06/18/17    Authorization - Visit Number  7    Authorization - Number of Visits  13    PT Start Time  0900    PT Stop Time  8338    PT Time Calculation (min)  42 min    Activity Tolerance  Patient tolerated treatment well;No increased pain    Behavior During Therapy  WFL for tasks assessed/performed       Past Medical History:  Diagnosis Date  . Arthritis   . Atrial fibrillation ([place]) 01/12/2010   2D Echo EF=>55%  . Bipolar disorder ([place])   . Chronic back pain   . COPD (chronic obstructive pulmonary disease) ([place])   . Depression   . Dysrhythmia    AFib  . History of colonic polyps   . History of gout   . Hyperlipidemia   . Hypertension   . Hypothyroidism   . Morbid obesity ([place])   . OSA (obstructive sleep apnea)    AHI was 27.62hr RDI was 34.3 hr REM 0.00hr; had CPAP years ago but no longer uses.  . Prostatitis   . Tubular adenoma of colon 01/2016    Past Surgical History:  Procedure Laterality Date  . APPENDECTOMY  1959  . asthma    . CATARACT EXTRACTION Bilateral 1995  . COLONOSCOPY  2011   RMR: left-sided diverticula, minimal rectal friability. No polyps. Repeat 5 years.  Marland Kitchen HAND / FINGER LESION EXCISION Right   . right shoulder surgery    . TONSILLECTOMY  1955    There were no vitals filed for this visit.  Subjective Assessment - 05/27/17 0904    Subjective  Pt reports no pain currently. He continues to  wake up with pain, however, once he gets moving, his pain decreases.    Currently in Pain?  No/denies         [facility] PT Assessment - 05/27/17 0001      AROM   Lumbar Flexion  50% limited, increased lordosis during flexion was 50% limited      Strength   Right Hip ABduction  4+/5 still LBP, was 4+    Left Hip ABduction  4+/5 still LBP, was 4+      Bed Mobility   Bed Mobility  Sit to Supine;Supine to Sit    Supine to Sit  6: Modified independent (Device/Increase time)    Sit to Supine  6: Modified independent (Device/Increase time)      Ambulation/Gait   Ambulation Distance (Feet)  508 Feet was 627      Balance   Balance Assessed  Yes      Static Standing Balance   Static Standing - Balance Support  No upper extremity supported    Static Standing Balance -  Activities   Single Leg Stance - Right Leg;Single Leg Stance - Left Leg    Static Standing - Comment/#  of Minutes  2 sec or < BLE with no UE      Standardized Balance Assessment   Standardized Balance Assessment  Dynamic Gait Index;Five Times Sit to Stand    Five times sit to stand comments   17 sec from chair, no UE was 28 sec      Dynamic Gait Index   Level Surface  Normal    Change in Gait Speed  Normal    Gait with Horizontal Head Turns  Mild Impairment    Gait with Vertical Head Turns  Mild Impairment    Gait and Pivot Turn  Mild Impairment    Step Over Obstacle  Mild Impairment    Step Around Obstacles  Normal    Steps  Moderate Impairment    Total Score  18    DGI comment:  -- was 9/24                  [facility] Adult PT Treatment/Exercise - 05/27/17 0001      Ambulation/Gait   Gait Comments  1 brief standing rest breaks      Lumbar Exercises: Stretches   ITB Stretch Limitations  seated lumbar flexion stretch 2x30 sec      Lumbar Exercises: Aerobic   Stationary Bike  NUSTEP x5 minutes for AA/ROM hip flexion and trunk rotation             PT Education - 05/27/17 0904    Education  provided  Yes    Education Details  reassessment findings, continue POC, added seated lumbar flexion stretch to HEP    Person(s) Educated  Patient    Methods  Explanation;Handout;Demonstration    Comprehension  Verbalized understanding;Returned demonstration       PT Short Term Goals - 05/27/17 0906      PT SHORT TERM GOAL #1   Title  Pt will be independent with HEP and perform consistently to maximize overall function and decrease pain.     Baseline  11/13: "some"    Time  3    Period  Weeks    Status  On-going      PT SHORT TERM GOAL #2   Title  Pt will have 5/5 MMT in bil hip abd to maximize gait and balance.     Baseline  11/13: 4+/5 hip ext BLE    Time  3    Period  Weeks    Status  On-going      PT SHORT TERM GOAL #3   Title  Pt will be modified independent with log rolling to decrease his LBP and maximize his bed mobility.    Time  3    Period  Weeks    Status  Achieved        PT Long Term Goals - 05/27/17 1308      PT LONG TERM GOAL #1   Title  Pt will be able to perform 5xSTS in 18 sec or < to demo improved functional BLE strength.     Baseline  11/13: 17 sec    Time  6    Period  Weeks    Status  Achieved      PT LONG TERM GOAL #2   Title  Pt will be able to perform SLS on BLE for 10 sec with no UE support in order to maximize gait and decrease risk for falls.     Baseline  11/13: 2 sec or < BLE    Time  6  Period  Weeks    Status  On-going      PT LONG TERM GOAL #3   Title  Pt will score a 20/24 on the DGI to demo improved balance and decrease risk for falls.    Baseline  111/13: 18/24    Time  6    Period  Weeks    Status  Partially Met      PT LONG TERM GOAL #4   Title  Pt will have improved 3MWT by 184f or > to demo improved overall tolerance to movement and maximize community access.     Baseline  112/09/24 5027f   Time  6    Period  Weeks    Status  On-going      PT LONG TERM GOAL #5   Title  Pt will have improved lumbar flexion AROM by  at least 25% to decrease pain and maximize pt's ability to put on his socks and shoes with less pain.     Baseline  1112/09/2024pt still 50% limited in lumbar flexion AROM and he is still unable to get his socks and shoes    Time  6    Period  Weeks    Status  On-going            Plan - 11December 09, [id number]    Clinical Impression Statement  PT reassessed pt's goals and outcome measures this date. Pt has made great progress towards all of his goals as illustrated above. His dynamic balance has significantly improved as he scored 18/24 on the DGI; however, he is still at risk for falls as he scored below 19. His functional strenght has significnatly improved as well as he was able to perform the 5xSTS in 17 sec, compared to 28 sec on initial eval. His lumbar flexion AROM is still his main limiting factor and he reports that he is still unable to put on his socks and shoes independently. POC will continue as planned giong forward.    Rehab Potential  Fair    PT Frequency  2x / week    PT Duration  6 weeks    PT Treatment/Interventions  ADLs/Self Care Home Management;Cryotherapy;Electrical Stimulation;Moist Heat;Traction;Ultrasound;DME Instruction;Gait training;Stair training;Functional mobility training;Therapeutic activities;Therapeutic exercise;Balance training;Neuromuscular re-education;Patient/family education;Manual techniques;Passive range of motion;Dry needling;Energy conservation;Taping    PT Next Visit Plan  Continue lumbar spine flexion and hip flexion (mindful of FAI), strength in hips, and mobility in hip soft tissue; review seated lumbar flexion stretch    PT Home Exercise Plan  eval: HS stretch, STS; 05/08/2017 - LTR, SKTC, DKTC, pelvic tilts; DKTC 2x30sec, DKTC 20-25x AROM repeated BID; 11Dec 09, 2024seated lumbar flexion stretch    Consulted and Agree with Plan of Care  Patient       Patient will benefit from skilled therapeutic intervention in order to improve the following deficits and  impairments:  Abnormal gait, Decreased activity tolerance, Decreased balance, Decreased endurance, Decreased mobility, Decreased range of motion, Decreased strength, Difficulty walking, Hypomobility, Increased fascial restricitons, Increased muscle spasms, Impaired flexibility, Improper body mechanics, Postural dysfunction, Pain, Obesity  Visit Diagnosis: Chronic midline low back pain without sciatica  Difficulty in walking, not elsewhere classified  Other symptoms and signs involving the musculoskeletal system  Muscle weakness (generalized)   G-Codes - [id number]    Functional Assessment Tool Used (Outpatient Only)  FOTO, clinical judgement, 5xSTS, SLS, gait, MMT    Functional Limitation  Mobility: Walking and moving around    Mobility: Walking and Moving Around  Current Status [phone])  At least 40 percent but less than 60 percent impaired, limited or restricted    Mobility: Walking and Moving Around Goal Status [phone])  At least 20 percent but less than 40 percent impaired, limited or restricted       Problem List Patient Active Problem List   Diagnosis Date Noted  . Chronic anticoagulation 05/05/2017  . Sleep apnea 05/05/2017  . Edema leg 05/05/2017  . Dysphasia 02/07/2017  . Mild obesity 05/10/2016  . History of colonic polyps   . Diverticulosis of colon without hemorrhage   . Hyperlipidemia LDL goal <130 05/16/2013  . KNEE PAIN 08/07/2009  . KNEE SPRAIN, ACUTE 08/07/2009  . COLONIC POLYPS, HX OF 05/10/2009  . HYPOTHYROIDISM 05/08/2009  . History of bipolar disorder 05/08/2009  . Essential hypertension 05/08/2009  . Permanent atrial fibrillation ([place]) 05/08/2009  . HEMATOCHEZIA 05/08/2009  . LOW BACK PAIN, CHRONIC 05/08/2009  . ABDOMINAL PAIN 05/08/2009       Geraldine Solar PT, DPT  [facility] [address] [place], [place], [address] Phone: [phone]   Fax:  [phone]  Name: Casey [name] MRN: [id number] Date  of Birth: Dec 04, 1945

## 2017-05-28 DIAGNOSIS — F319 Bipolar disorder, unspecified: Secondary | ICD-10-CM | POA: Diagnosis not present

## 2017-05-29 ENCOUNTER — Encounter: Payer: Self-pay | Admitting: Internal Medicine

## 2017-05-29 ENCOUNTER — Ambulatory Visit (HOSPITAL_COMMUNITY): Payer: Medicare Other

## 2017-05-29 ENCOUNTER — Encounter (HOSPITAL_COMMUNITY): Payer: Self-pay

## 2017-05-29 DIAGNOSIS — R262 Difficulty in walking, not elsewhere classified: Secondary | ICD-10-CM | POA: Diagnosis not present

## 2017-05-29 DIAGNOSIS — M545 Low back pain, unspecified: Secondary | ICD-10-CM

## 2017-05-29 DIAGNOSIS — M6281 Muscle weakness (generalized): Secondary | ICD-10-CM

## 2017-05-29 DIAGNOSIS — G8929 Other chronic pain: Secondary | ICD-10-CM

## 2017-05-29 DIAGNOSIS — R29898 Other symptoms and signs involving the musculoskeletal system: Secondary | ICD-10-CM

## 2017-05-29 NOTE — Therapy (Signed)
[place], [place], [address] Phone: [phone]   Fax:  [phone]  Physical Therapy Treatment  Patient Details  Name: Casey [name] MRN: [id number] Date of Birth: [birth year] Referring Provider: Glenna Fellows, MD   Encounter Date: 05/29/2017  PT End of Session - 05/29/17 0852    Visit Number  8    Number of Visits  13    Date for PT Re-Evaluation  06/18/17    Authorization Type  Medicare Part A and B (g codes done on visit #7)    Authorization Time Period  05/07/17 to 06/18/17    Authorization - Visit Number  8    Authorization - Number of Visits  13    PT Start Time  3151    PT Stop Time  0933    PT Time Calculation (min)  40 min    Activity Tolerance  Patient tolerated treatment well;No increased pain    Behavior During Therapy  WFL for tasks assessed/performed       Past Medical History:  Diagnosis Date  . Arthritis   . Atrial fibrillation ([place]) 01/12/2010   2D Echo EF=>55%  . Bipolar disorder ([place])   . Chronic back pain   . COPD (chronic obstructive pulmonary disease) ([employer])   . Depression   . Dysrhythmia    AFib  . History of colonic polyps   . History of gout   . Hyperlipidemia   . Hypertension   . Hypothyroidism   . Morbid obesity ([place])   . OSA (obstructive sleep apnea)    AHI was 27.62hr RDI was 34.3 hr REM 0.00hr; had CPAP years ago but no longer uses.  . Prostatitis   . Tubular adenoma of colon 01/2016    Past Surgical History:  Procedure Laterality Date  . APPENDECTOMY  1959  . asthma    . CATARACT EXTRACTION Bilateral 1995  . COLONOSCOPY  2011   RMR: left-sided diverticula, minimal rectal friability. No polyps. Repeat 5 years.  . COLONOSCOPY WITH PROPOFOL N/A 01/15/2016   Dr.Rourk- diverticulosis in the entire examined colon, multiple rectal and colonic polyps, internal hemorrhoids. bx= tubular adenomas and hyperplastic polyps.   [facility] Kitchen HAND / FINGER LESION EXCISION Right   . POLYPECTOMY   01/15/2016   Procedure: POLYPECTOMY;  Surgeon: Daneil Dolin, MD;  Location: [facility];  Service: Endoscopy;;  . right shoulder surgery    . TONSILLECTOMY  1955    There were no vitals filed for this visit.  Subjective Assessment - 05/29/17 0853    Subjective  Pt reports that his is stiff this morning, but no real pain. He felt good following his reassessment last visit. He saw his MD yesterday regarding his medications because he has started to to feel "sluggish." He reports that his MD reduced some of his dosages. He f/u with the MD again next Wednesday.    Currently in Pain?  No/denies           [facility] Adult PT Treatment/Exercise - 05/29/17 0001      Lumbar Exercises: Stretches   Single Knee to Chest Stretch Limitations  3x30 seconds, bilat supine P/ROM stretching + abd and slight ER to avoid FAI    Quad Stretch Limitations  manual sidelying throacic rotation stretch 3x15-20" each    ITB Stretch Limitations  seated lumbar flexion stretch 3x30 sec      Lumbar Exercises: Aerobic   Stationary Bike  NUSTEP x5 minutes for AA/ROM, L3  hip flexion, trunk rotation      Lumbar Exercises: Standing   Other Standing Lumbar Exercises  x2 laps around gym at [employer]    Other Standing Lumbar Exercises  fwd step ups on 4" step x10 reps each      Lumbar Exercises: Seated   Sit to Stand  10 reps 2 sets; front loaded with 10# orange ball and OH press    Other Seated Lumbar Exercises  3D thoracic excursions x10 reps           PT Education - 05/29/17 0852    Education provided  Yes    Education Details  exercise technique    Person(s) Educated  Patient    Methods  Explanation    Comprehension  Verbalized understanding       PT Short Term Goals - 05/27/17 0906      PT SHORT TERM GOAL #1   Title  Pt will be independent with HEP and perform consistently to maximize overall function and decrease pain.     Baseline  11/13: "some"    Time  3    Period  Weeks    Status  On-going       PT SHORT TERM GOAL #2   Title  Pt will have 5/5 MMT in bil hip abd to maximize gait and balance.     Baseline  11/13: 4+/5 hip ext BLE    Time  3    Period  Weeks    Status  On-going      PT SHORT TERM GOAL #3   Title  Pt will be modified independent with log rolling to decrease his LBP and maximize his bed mobility.    Time  3    Period  Weeks    Status  Achieved        PT Long Term Goals - 05/27/17 5400      PT LONG TERM GOAL #1   Title  Pt will be able to perform 5xSTS in 18 sec or < to demo improved functional BLE strength.     Baseline  11/13: 17 sec    Time  6    Period  Weeks    Status  Achieved      PT LONG TERM GOAL #2   Title  Pt will be able to perform SLS on BLE for 10 sec with no UE support in order to maximize gait and decrease risk for falls.     Baseline  11/13: 2 sec or < BLE    Time  6    Period  Weeks    Status  On-going      PT LONG TERM GOAL #3   Title  Pt will score a 20/24 on the DGI to demo improved balance and decrease risk for falls.    Baseline  111/13: 18/24    Time  6    Period  Weeks    Status  Partially Met      PT LONG TERM GOAL #4   Title  Pt will have improved 3MWT by 128f or > to demo improved overall tolerance to movement and maximize community access.     Baseline  11/13: 50105f   Time  6    Period  Weeks    Status  On-going      PT LONG TERM GOAL #5   Title  Pt will have improved lumbar flexion AROM by at least 25% to decrease pain and maximize pt's ability  to put on his socks and shoes with less pain.     Baseline  11/13: pt still 50% limited in lumbar flexion AROM and he is still unable to get his socks and shoes    Time  6    Period  Weeks    Status  On-going            Plan - 05/29/17 0934    Clinical Impression Statement  Session focused on improving spinal and hip mobility as well as hip strenthening. Pt tolerated session well, but was noted to be significantly stiff in thoracic spine. Progressed his hip  strengthening this date with good response. Continue POC as planned.    Rehab Potential  Fair    PT Frequency  2x / week    PT Duration  6 weeks    PT Treatment/Interventions  ADLs/Self Care Home Management;Cryotherapy;Electrical Stimulation;Moist Heat;Traction;Ultrasound;DME Instruction;Gait training;Stair training;Functional mobility training;Therapeutic activities;Therapeutic exercise;Balance training;Neuromuscular re-education;Patient/family education;Manual techniques;Passive range of motion;Dry needling;Energy conservation;Taping    PT Next Visit Plan  Continue lumbar spine flexion and hip flexion (mindful of FAI), strength in hips, and mobility in hip soft tissue; add OP to thoracic rotation in sitting and add thoracic excursions to HEP; add 3D hip excursions    PT Home Exercise Plan  eval: HS stretch, STS; 05/08/2017 - LTR, SKTC, DKTC, pelvic tilts; DKTC 2x30sec, DKTC 20-25x AROM repeated BID; 11/13: seated lumbar flexion stretch    Consulted and Agree with Plan of Care  Patient       Patient will benefit from skilled therapeutic intervention in order to improve the following deficits and impairments:  Abnormal gait, Decreased activity tolerance, Decreased balance, Decreased endurance, Decreased mobility, Decreased range of motion, Decreased strength, Difficulty walking, Hypomobility, Increased fascial restricitons, Increased muscle spasms, Impaired flexibility, Improper body mechanics, Postural dysfunction, Pain, Obesity  Visit Diagnosis: Chronic midline low back pain without sciatica  Difficulty in walking, not elsewhere classified  Other symptoms and signs involving the musculoskeletal system  Muscle weakness (generalized)     Problem List Patient Active Problem List   Diagnosis Date Noted  . Chronic anticoagulation 05/05/2017  . Sleep apnea 05/05/2017  . Edema leg 05/05/2017  . Dysphasia 02/07/2017  . Mild obesity 05/10/2016  . History of colonic polyps   .  Diverticulosis of colon without hemorrhage   . Hyperlipidemia LDL goal <130 05/16/2013  . KNEE PAIN 08/07/2009  . KNEE SPRAIN, ACUTE 08/07/2009  . COLONIC POLYPS, HX OF 05/10/2009  . HYPOTHYROIDISM 05/08/2009  . History of bipolar disorder 05/08/2009  . Essential hypertension 05/08/2009  . Permanent atrial fibrillation ([place]) 05/08/2009  . HEMATOCHEZIA 05/08/2009  . LOW BACK PAIN, CHRONIC 05/08/2009  . ABDOMINAL PAIN 05/08/2009       Geraldine Solar PT, DPT  [facility] [address] [place], [place], [address] Phone: [phone]   Fax:  [phone]  Name: Casey [name] MRN: [id number] Date of Birth: 1945-08-12

## 2017-06-02 ENCOUNTER — Encounter (HOSPITAL_COMMUNITY): Payer: Self-pay

## 2017-06-02 ENCOUNTER — Ambulatory Visit (HOSPITAL_COMMUNITY): Payer: Medicare Other

## 2017-06-02 DIAGNOSIS — G8929 Other chronic pain: Secondary | ICD-10-CM | POA: Diagnosis not present

## 2017-06-02 DIAGNOSIS — M545 Low back pain, unspecified: Secondary | ICD-10-CM

## 2017-06-02 DIAGNOSIS — M6281 Muscle weakness (generalized): Secondary | ICD-10-CM | POA: Diagnosis not present

## 2017-06-02 DIAGNOSIS — R29898 Other symptoms and signs involving the musculoskeletal system: Secondary | ICD-10-CM | POA: Diagnosis not present

## 2017-06-02 DIAGNOSIS — R262 Difficulty in walking, not elsewhere classified: Secondary | ICD-10-CM

## 2017-06-02 NOTE — Therapy (Signed)
[place], [place], [address] Phone: [phone]   Fax:  [phone]  Physical Therapy Treatment  Patient Details  Name: Casey [name] MRN: [id number] Date of Birth: [birth year] Referring Provider: Glenna Fellows, MD   Encounter Date: 06/02/2017  PT End of Session - 06/02/17 0814    Visit Number  9    Number of Visits  13    Date for PT Re-Evaluation  06/18/17    Authorization Type  Medicare Part A and B (g codes done on visit #7)    Authorization Time Period  05/07/17 to 06/18/17    Authorization - Visit Number  9    Authorization - Number of Visits  13    PT Start Time  8768    PT Stop Time  1157    PT Time Calculation (min)  41 min    Activity Tolerance  Patient tolerated treatment well;No increased pain    Behavior During Therapy  WFL for tasks assessed/performed       Past Medical History:  Diagnosis Date  . Arthritis   . Atrial fibrillation ([place]) 01/12/2010   2D Echo EF=>55%  . Bipolar disorder ([place])   . Chronic back pain   . COPD (chronic obstructive pulmonary disease) ([place])   . Depression   . Dysrhythmia    AFib  . History of colonic polyps   . History of gout   . Hyperlipidemia   . Hypertension   . Hypothyroidism   . Morbid obesity ([place])   . OSA (obstructive sleep apnea)    AHI was 27.62hr RDI was 34.3 hr REM 0.00hr; had CPAP years ago but no longer uses.  . Prostatitis   . Tubular adenoma of colon 01/2016    Past Surgical History:  Procedure Laterality Date  . APPENDECTOMY  1959  . asthma    . CATARACT EXTRACTION Bilateral 1995  . COLONOSCOPY  2011   RMR: left-sided diverticula, minimal rectal friability. No polyps. Repeat 5 years.  . COLONOSCOPY WITH PROPOFOL N/A 01/15/2016   Performed by Daneil Dolin, MD at [facility]  . HAND / FINGER LESION EXCISION Right   . POLYPECTOMY  01/15/2016   Performed by Daneil Dolin, MD at [facility]  . right shoulder surgery    . TONSILLECTOMY   1955    There were no vitals filed for this visit.  Subjective Assessment - 06/02/17 0817    Subjective  Pt reports that he pulled a muscle in his lower back on the Left side on Saturday when he was trying to walk his dog. He states that it catches on him intermittently only when he twists and then he will have to sit down.     Currently in Pain?  No/denies           [facility] Adult PT Treatment/Exercise - 06/02/17 0001      Lumbar Exercises: Stretches   Lower Trunk Rotation Limitations  supine LTRs 10x5" each      Lumbar Exercises: Aerobic   Stationary Bike  NUSTEP x5 minutes for AA/ROM, L4 hip flexion and trunk rotation      Lumbar Exercises: Standing   Shoulder Adduction Limitations  3D hip excursions x10 reps each      Lumbar Exercises: Seated   Other Seated Lumbar Exercises  3D thoracic excursions x10 reps      Lumbar Exercises: Supine   Isometric Hip Flexion Limitations  deadbugs x10 reps each  with diaphragmatic breathing      Manual Therapy   Manual Therapy  Soft tissue mobilization    Manual therapy comments  completed separate rest of treatment    Soft tissue mobilization  IASTM with green ball to lower thoracic/upper lumbar paraspinals            PT Education - 06/02/17 0819    Education provided  Yes    Education Details  added LTRs to HEP; will continue functional strengthening once acute pain has decreases    Person(s) Educated  Patient    Methods  Explanation;Demonstration;Handout    Comprehension  Verbalized understanding;Returned demonstration       PT Short Term Goals - 05/27/17 0906      PT SHORT TERM GOAL #1   Title  Pt will be independent with HEP and perform consistently to maximize overall function and decrease pain.     Baseline  11/13: "some"    Time  3    Period  Weeks    Status  On-going      PT SHORT TERM GOAL #2   Title  Pt will have 5/5 MMT in bil hip abd to maximize gait and balance.     Baseline  11/13: 4+/5 hip ext BLE     Time  3    Period  Weeks    Status  On-going      PT SHORT TERM GOAL #3   Title  Pt will be modified independent with log rolling to decrease his LBP and maximize his bed mobility.    Time  3    Period  Weeks    Status  Achieved        PT Long Term Goals - 05/27/17 4081      PT LONG TERM GOAL #1   Title  Pt will be able to perform 5xSTS in 18 sec or < to demo improved functional BLE strength.     Baseline  11/13: 17 sec    Time  6    Period  Weeks    Status  Achieved      PT LONG TERM GOAL #2   Title  Pt will be able to perform SLS on BLE for 10 sec with no UE support in order to maximize gait and decrease risk for falls.     Baseline  11/13: 2 sec or < BLE    Time  6    Period  Weeks    Status  On-going      PT LONG TERM GOAL #3   Title  Pt will score a 20/24 on the DGI to demo improved balance and decrease risk for falls.    Baseline  111/13: 18/24    Time  6    Period  Weeks    Status  Partially Met      PT LONG TERM GOAL #4   Title  Pt will have improved 3MWT by 161f or > to demo improved overall tolerance to movement and maximize community access.     Baseline  11/13: 5046f   Time  6    Period  Weeks    Status  On-going      PT LONG TERM GOAL #5   Title  Pt will have improved lumbar flexion AROM by at least 25% to decrease pain and maximize pt's ability to put on his socks and shoes with less pain.     Baseline  11/13: pt still 50% limited in lumbar  flexion AROM and he is still unable to get his socks and shoes    Time  6    Period  Weeks    Status  On-going            Plan - 06/02/17 0856    Clinical Impression Statement  Pt presented to therapy reporting that he pulled his back when walking his dog. Instrument assisted STM with green ball performed to address muscle tightness in areas of complaints. Pt reported some improved symptoms following but then reported a catch in his lower back/L hip region. Rest of session focused on core strength and  mobility of thoracic spine and hips. Continue POC as planned and progress as able to once new acute back pain has settled.     Rehab Potential  Fair    PT Frequency  2x / week    PT Duration  6 weeks    PT Treatment/Interventions  ADLs/Self Care Home Management;Cryotherapy;Electrical Stimulation;Moist Heat;Traction;Ultrasound;DME Instruction;Gait training;Stair training;Functional mobility training;Therapeutic activities;Therapeutic exercise;Balance training;Neuromuscular re-education;Patient/family education;Manual techniques;Passive range of motion;Dry needling;Energy conservation;Taping    PT Next Visit Plan  Continue lumbar spine flexion and hip flexion (mindful of FAI), strength in hips, and mobility in hip soft tissue; add OP to thoracic rotation in sitting (did not add this visit due to acute pain in back) and add thoracic and hip excursions to HEP    PT Home Exercise Plan  eval: HS stretch, STS; 05/08/2017 - LTR, SKTC, DKTC, pelvic tilts; DKTC 2x30sec, DKTC 20-25x AROM repeated BID; 11/13: seated lumbar flexion stretch; 11/19: LTRs    Consulted and Agree with Plan of Care  Patient       Patient will benefit from skilled therapeutic intervention in order to improve the following deficits and impairments:  Abnormal gait, Decreased activity tolerance, Decreased balance, Decreased endurance, Decreased mobility, Decreased range of motion, Decreased strength, Difficulty walking, Hypomobility, Increased fascial restricitons, Increased muscle spasms, Impaired flexibility, Improper body mechanics, Postural dysfunction, Pain, Obesity  Visit Diagnosis: Chronic midline low back pain without sciatica  Difficulty in walking, not elsewhere classified  Other symptoms and signs involving the musculoskeletal system  Muscle weakness (generalized)     Problem List Patient Active Problem List   Diagnosis Date Noted  . Chronic anticoagulation 05/05/2017  . Sleep apnea 05/05/2017  . Edema leg  05/05/2017  . Dysphasia 02/07/2017  . Mild obesity 05/10/2016  . History of colonic polyps   . Diverticulosis of colon without hemorrhage   . Hyperlipidemia LDL goal <130 05/16/2013  . KNEE PAIN 08/07/2009  . KNEE SPRAIN, ACUTE 08/07/2009  . COLONIC POLYPS, HX OF 05/10/2009  . HYPOTHYROIDISM 05/08/2009  . History of bipolar disorder 05/08/2009  . Essential hypertension 05/08/2009  . Permanent atrial fibrillation ([place]) 05/08/2009  . HEMATOCHEZIA 05/08/2009  . LOW BACK PAIN, CHRONIC 05/08/2009  . ABDOMINAL PAIN 05/08/2009      Geraldine Solar PT, DPT  [facility] [address] [place], [place], [address] Phone: [phone]   Fax:  [phone]  Name: Casey [name] MRN: [id number] Date of Birth: [birth year]

## 2017-06-02 NOTE — Patient Instructions (Signed)
  LOWER TRUNK ROTATIONS - LTR  Lying on your back with your knees bent, gently move your knees side-to-side.  Perform 1x/day (or more if it is helping you), 10-15 stretches holding for 5-10 seconds each

## 2017-06-03 ENCOUNTER — Encounter (HOSPITAL_COMMUNITY): Payer: Self-pay

## 2017-06-03 ENCOUNTER — Ambulatory Visit (HOSPITAL_COMMUNITY): Payer: Medicare Other

## 2017-06-03 DIAGNOSIS — R29898 Other symptoms and signs involving the musculoskeletal system: Secondary | ICD-10-CM | POA: Diagnosis not present

## 2017-06-03 DIAGNOSIS — G8929 Other chronic pain: Secondary | ICD-10-CM

## 2017-06-03 DIAGNOSIS — M545 Low back pain: Principal | ICD-10-CM

## 2017-06-03 DIAGNOSIS — M6281 Muscle weakness (generalized): Secondary | ICD-10-CM

## 2017-06-03 DIAGNOSIS — R262 Difficulty in walking, not elsewhere classified: Secondary | ICD-10-CM

## 2017-06-03 NOTE — Therapy (Signed)
[place], [place], [address] Phone: [phone]   Fax:  [phone]  Physical Therapy Treatment  Patient Details  Name: Casey [name] MRN: [id number] Date of Birth: [birth year] Referring Provider: Glenna Fellows, MD   Encounter Date: 06/03/2017  PT End of Session - 06/03/17 0821    Visit Number  10    Number of Visits  13    Date for PT Re-Evaluation  06/18/17    Authorization Type  Medicare Part A and B (g codes done on visit #7)    Authorization Time Period  05/07/17 to 06/18/17    Authorization - Visit Number  9    Authorization - Number of Visits  13    PT Start Time  0816    PT Stop Time  0902    PT Time Calculation (min)  46 min    Activity Tolerance  Patient tolerated treatment well;No increased pain    Behavior During Therapy  WFL for tasks assessed/performed       Past Medical History:  Diagnosis Date  . Arthritis   . Atrial fibrillation ([place]) 01/12/2010   2D Echo EF=>55%  . Bipolar disorder ([place])   . Chronic back pain   . COPD (chronic obstructive pulmonary disease) ([place])   . Depression   . Dysrhythmia    AFib  . History of colonic polyps   . History of gout   . Hyperlipidemia   . Hypertension   . Hypothyroidism   . Morbid obesity ([place])   . OSA (obstructive sleep apnea)    AHI was 27.62hr RDI was 34.3 hr REM 0.00hr; had CPAP years ago but no longer uses.  . Prostatitis   . Tubular adenoma of colon 01/2016    Past Surgical History:  Procedure Laterality Date  . APPENDECTOMY  1959  . asthma    . CATARACT EXTRACTION Bilateral 1995  . COLONOSCOPY  2011   RMR: left-sided diverticula, minimal rectal friability. No polyps. Repeat 5 years.  . COLONOSCOPY WITH PROPOFOL N/A 01/15/2016   Performed by Daneil Dolin, MD at [facility]  . HAND / FINGER LESION EXCISION Right   . POLYPECTOMY  01/15/2016   Performed by Daneil Dolin, MD at [facility]  . right shoulder surgery    .  TONSILLECTOMY  1955    There were no vitals filed for this visit.  Subjective Assessment - 06/03/17 0819    Subjective  Pt stated he is feeling good today, stated main with stiffness in the morning, usually walks dog which helps to loosen up.  Reports compliance with HEP at least 3x a week.    Patient Stated Goals  get back in shape, get socks and shoes back on    Currently in Pain?  No/denies                      [facility] Adult PT Treatment/Exercise - 06/03/17 0001      Lumbar Exercises: Stretches   Lower Trunk Rotation Limitations  supine LTRs 10x5" each      Lumbar Exercises: Aerobic   Stationary Bike  NUSTEP x5 minutes for AA/ROM, L4 hip flexion and trunk rotation      Lumbar Exercises: Standing   Functional Squats  10 reps 3D hip excursion      Lumbar Exercises: Seated   Other Seated Lumbar Exercises  3D thoracic excursions x10 reps      Lumbar Exercises:  Supine   Isometric Hip Flexion Limitations  deadbugs x10 reps each with diaphragmatic breathing      Manual Therapy   Manual Therapy  Soft tissue mobilization    Manual therapy comments  completed separate rest of treatment    Soft tissue mobilization  IASTM with green ball to lower thoracic/upper lumbar paraspinals             PT Education - 06/02/17 0819    Education provided  Yes    Education Details  added LTRs to HEP; will continue functional strengthening once acute pain has decreases    Person(s) Educated  Patient    Methods  Explanation;Demonstration;Handout    Comprehension  Verbalized understanding;Returned demonstration       PT Short Term Goals - 05/27/17 0906      PT SHORT TERM GOAL #1   Title  Pt will be independent with HEP and perform consistently to maximize overall function and decrease pain.     Baseline  11/13: "some"    Time  3    Period  Weeks    Status  On-going      PT SHORT TERM GOAL #2   Title  Pt will have 5/5 MMT in bil hip abd to maximize gait and balance.      Baseline  11/13: 4+/5 hip ext BLE    Time  3    Period  Weeks    Status  On-going      PT SHORT TERM GOAL #3   Title  Pt will be modified independent with log rolling to decrease his LBP and maximize his bed mobility.    Time  3    Period  Weeks    Status  Achieved        PT Long Term Goals - 05/27/17 1610      PT LONG TERM GOAL #1   Title  Pt will be able to perform 5xSTS in 18 sec or < to demo improved functional BLE strength.     Baseline  11/13: 17 sec    Time  6    Period  Weeks    Status  Achieved      PT LONG TERM GOAL #2   Title  Pt will be able to perform SLS on BLE for 10 sec with no UE support in order to maximize gait and decrease risk for falls.     Baseline  11/13: 2 sec or < BLE    Time  6    Period  Weeks    Status  On-going      PT LONG TERM GOAL #3   Title  Pt will score a 20/24 on the DGI to demo improved balance and decrease risk for falls.    Baseline  111/13: 18/24    Time  6    Period  Weeks    Status  Partially Met      PT LONG TERM GOAL #4   Title  Pt will have improved 3MWT by 158f or > to demo improved overall tolerance to movement and maximize community access.     Baseline  11/13: 5088f   Time  6    Period  Weeks    Status  On-going      PT LONG TERM GOAL #5   Title  Pt will have improved lumbar flexion AROM by at least 25% to decrease pain and maximize pt's ability to put on his socks and shoes with less pain.  Baseline  11/13: pt still 50% limited in lumbar flexion AROM and he is still unable to get his socks and shoes    Time  6    Period  Weeks    Status  On-going            Plan - 06/03/17 0845    Clinical Impression Statement  Session focus addressing spinal mobility and core strengthening to assist with lumbar pain.  Pt able to demonstrate appropraite form with bed mobiility.  Able to complete all therex with no reports of pain, did report pain with transition from supine to sitting while rolling on Lt side.  Therex  with therapist facilitaiotn to improve form and control with core strengthening exercsies.  EOS with manual to address soft tissue restrictions for pain control.  Reviewed HEP compliance and encouraged pt to begin walking program to address activity tolerance and recent weight gain with medication.  No additional HEP was given this session as cueing was required for proper form with therex today.      Rehab Potential  Fair    PT Frequency  2x / week    PT Duration  6 weeks    PT Treatment/Interventions  ADLs/Self Care Home Management;Cryotherapy;Electrical Stimulation;Moist Heat;Traction;Ultrasound;DME Instruction;Gait training;Stair training;Functional mobility training;Therapeutic activities;Therapeutic exercise;Balance training;Neuromuscular re-education;Patient/family education;Manual techniques;Passive range of motion;Dry needling;Energy conservation;Taping    PT Next Visit Plan  Continue lumbar spine flexion and hip flexion (mindful of FAI), strength in hips, and mobility in hip soft tissue; add OP to thoracic rotation in sitting (did not add this visit due to acute pain in back) and add thoracic and hip excursions to HEP    PT Home Exercise Plan  eval: HS stretch, STS; 05/08/2017 - LTR, SKTC, DKTC, pelvic tilts; DKTC 2x30sec, DKTC 20-25x AROM repeated BID; 11/13: seated lumbar flexion stretch; 11/19: LTRs; 11/20: walking program       Patient will benefit from skilled therapeutic intervention in order to improve the following deficits and impairments:  Abnormal gait, Decreased activity tolerance, Decreased balance, Decreased endurance, Decreased mobility, Decreased range of motion, Decreased strength, Difficulty walking, Hypomobility, Increased fascial restricitons, Increased muscle spasms, Impaired flexibility, Improper body mechanics, Postural dysfunction, Pain, Obesity  Visit Diagnosis: Chronic midline low back pain without sciatica  Difficulty in walking, not elsewhere classified  Other  symptoms and signs involving the musculoskeletal system  Muscle weakness (generalized)     Problem List Patient Active Problem List   Diagnosis Date Noted  . Chronic anticoagulation 05/05/2017  . Sleep apnea 05/05/2017  . Edema leg 05/05/2017  . Dysphasia 02/07/2017  . Mild obesity 05/10/2016  . History of colonic polyps   . Diverticulosis of colon without hemorrhage   . Hyperlipidemia LDL goal <130 05/16/2013  . KNEE PAIN 08/07/2009  . KNEE SPRAIN, ACUTE 08/07/2009  . COLONIC POLYPS, HX OF 05/10/2009  . HYPOTHYROIDISM 05/08/2009  . History of bipolar disorder 05/08/2009  . Essential hypertension 05/08/2009  . Permanent atrial fibrillation ([place]) 05/08/2009  . HEMATOCHEZIA 05/08/2009  . LOW BACK PAIN, CHRONIC 05/08/2009  . ABDOMINAL PAIN 05/08/2009   Ihor Austin, [place]; [place]  Aldona Lento 06/03/2017, 11:39 AM  [place], [place], [address] Phone: [phone]   Fax:  [phone]  Name: Casey [name] MRN: [id number] Date of Birth: January 20, 1946

## 2017-06-04 DIAGNOSIS — F319 Bipolar disorder, unspecified: Secondary | ICD-10-CM | POA: Diagnosis not present

## 2017-06-09 ENCOUNTER — Encounter (HOSPITAL_COMMUNITY): Payer: Self-pay

## 2017-06-09 ENCOUNTER — Ambulatory Visit (HOSPITAL_COMMUNITY): Payer: Medicare Other

## 2017-06-09 DIAGNOSIS — M545 Low back pain, unspecified: Secondary | ICD-10-CM

## 2017-06-09 DIAGNOSIS — R262 Difficulty in walking, not elsewhere classified: Secondary | ICD-10-CM

## 2017-06-09 DIAGNOSIS — M6281 Muscle weakness (generalized): Secondary | ICD-10-CM

## 2017-06-09 DIAGNOSIS — R29898 Other symptoms and signs involving the musculoskeletal system: Secondary | ICD-10-CM

## 2017-06-09 DIAGNOSIS — G8929 Other chronic pain: Secondary | ICD-10-CM | POA: Diagnosis not present

## 2017-06-09 NOTE — Therapy (Signed)
[facility] [place], [place], [address] Phone: [phone]   Fax:  [phone]  Physical Therapy Treatment  Patient Details  Name: Casey [name] MRN: [id number] Date of Birth: 1946/04/23 Referring Provider: Glenna Fellows, MD   Encounter Date: 06/09/2017  PT End of Session - 06/09/17 0816    Visit Number  11    Number of Visits  13    Date for PT Re-Evaluation  06/18/17    Authorization Type  Medicare Part A and B (g codes done on visit #7)    Authorization Time Period  05/07/17 to 06/18/17    Authorization - Visit Number  11    Authorization - Number of Visits  13    PT Start Time  3710    PT Stop Time  0856    PT Time Calculation (min)  42 min    Activity Tolerance  Patient tolerated treatment well;No increased pain    Behavior During Therapy  WFL for tasks assessed/performed       Past Medical History:  Diagnosis Date  . Arthritis   . Atrial fibrillation ([place]) 01/12/2010   2D Echo EF=>55%  . Bipolar disorder ([place])   . Chronic back pain   . COPD (chronic obstructive pulmonary disease) ([place])   . Depression   . Dysrhythmia    AFib  . History of colonic polyps   . History of gout   . Hyperlipidemia   . Hypertension   . Hypothyroidism   . Morbid obesity ([place])   . OSA (obstructive sleep apnea)    AHI was 27.62hr RDI was 34.3 hr REM 0.00hr; had CPAP years ago but no longer uses.  . Prostatitis   . Tubular adenoma of colon 01/2016    Past Surgical History:  Procedure Laterality Date  . APPENDECTOMY  1959  . asthma    . CATARACT EXTRACTION Bilateral 1995  . COLONOSCOPY  2011   RMR: left-sided diverticula, minimal rectal friability. No polyps. Repeat 5 years.  . COLONOSCOPY WITH PROPOFOL N/A 01/15/2016   Dr.Rourk- diverticulosis in the entire examined colon, multiple rectal and colonic polyps, internal hemorrhoids. bx= tubular adenomas and hyperplastic polyps.   Marland Kitchen HAND / FINGER LESION EXCISION Right   .  POLYPECTOMY  01/15/2016   Procedure: POLYPECTOMY;  Surgeon: Daneil Dolin, MD;  Location[facility];  Service: Endoscopy;;  . right shoulder surgery    . TONSILLECTOMY  1955    There were no vitals filed for this visit.  Subjective Assessment - 06/09/17 0817    Subjective  Pt reports that he ate too much over Thanksgiving. He feels tight this morning.    Patient Stated Goals  get back in shape, get socks and shoes back on    Currently in Pain?  No/denies           [facility] Adult PT Treatment/Exercise - 06/09/17 0001      Lumbar Exercises: Stretches   Quad Stretch Limitations  seated thoracic ext/lumbar flexion stretch and lateral flexion stretch with physioball 5x10" holds each      Lumbar Exercises: Aerobic   Stationary Bike  NUSTEP x5 minutes for AA/ROM, L4 hip flexion and trunk rotation      Lumbar Exercises: Standing   Functional Squats  10 reps front loaded with 10# orange ball    Other Standing Lumbar Exercises  2 x5 reps of deadlifts from 8" step with 10# DB      Lumbar Exercises: Seated  Sit to Stand  5 reps 3 sets, + OH press with 10# orange ball    Other Seated Lumbar Exercises  3D thoracic excursions with UE movements x10 reps each    Other Seated Lumbar Exercises  thoracic ext over 1/2 foam roll x5 reps at 3 different segments between T4-12      Lumbar Exercises: Supine   Bridge  10 reps bil ankle DF for improved posterior chain engagement            PT Education - 06/09/17 0817    Education provided  Yes    Education Details  continued to encourage walking program; added bridging and thoracic excursions with UE movements to HEP    Person(s) Educated  Patient    Methods  Explanation;Demonstration    Comprehension  Verbalized understanding;Returned demonstration       PT Short Term Goals - 05/27/17 0906      PT SHORT TERM GOAL #1   Title  Pt will be independent with HEP and perform consistently to maximize overall function and decrease pain.      Baseline  11/13: "some"    Time  3    Period  Weeks    Status  On-going      PT SHORT TERM GOAL #2   Title  Pt will have 5/5 MMT in bil hip abd to maximize gait and balance.     Baseline  11/13: 4+/5 hip ext BLE    Time  3    Period  Weeks    Status  On-going      PT SHORT TERM GOAL #3   Title  Pt will be modified independent with log rolling to decrease his LBP and maximize his bed mobility.    Time  3    Period  Weeks    Status  Achieved        PT Long Term Goals - 05/27/17 6967      PT LONG TERM GOAL #1   Title  Pt will be able to perform 5xSTS in 18 sec or < to demo improved functional BLE strength.     Baseline  11/13: 17 sec    Time  6    Period  Weeks    Status  Achieved      PT LONG TERM GOAL #2   Title  Pt will be able to perform SLS on BLE for 10 sec with no UE support in order to maximize gait and decrease risk for falls.     Baseline  11/13: 2 sec or < BLE    Time  6    Period  Weeks    Status  On-going      PT LONG TERM GOAL #3   Title  Pt will score a 20/24 on the DGI to demo improved balance and decrease risk for falls.    Baseline  111/13: 18/24    Time  6    Period  Weeks    Status  Partially Met      PT LONG TERM GOAL #4   Title  Pt will have improved 3MWT by 155f or > to demo improved overall tolerance to movement and maximize community access.     Baseline  11/13: 5076f   Time  6    Period  Weeks    Status  On-going      PT LONG TERM GOAL #5   Title  Pt will have improved lumbar flexion AROM by at  least 25% to decrease pain and maximize pt's ability to put on his socks and shoes with less pain.     Baseline  11/13: pt still 50% limited in lumbar flexion AROM and he is still unable to get his socks and shoes    Time  6    Period  Weeks    Status  On-going            Plan - 06/09/17 0856    Clinical Impression Statement  Continued to focus on thoracic mobility and gluteal strengthening this date. Pt with decreasing LBP from where  he pulled it last week so he was able to tolerate strengthening this date with no reports of increased pain. Continued to encourage walking program at home for 5-10 mins straight, 2x/day and pt verbalized understanding. Continue POC as planned.    Rehab Potential  Fair    PT Frequency  2x / week    PT Duration  6 weeks    PT Treatment/Interventions  ADLs/Self Care Home Management;Cryotherapy;Electrical Stimulation;Moist Heat;Traction;Ultrasound;DME Instruction;Gait training;Stair training;Functional mobility training;Therapeutic activities;Therapeutic exercise;Balance training;Neuromuscular re-education;Patient/family education;Manual techniques;Passive range of motion;Dry needling;Energy conservation;Taping    PT Next Visit Plan  Continue lumbar spine flexion and hip flexion (mindful of FAI), strength in hips, and mobility in hip soft tissue; add thoracic and hip excursions to HEP; continue deadlifts, add step up with knee drive next visit    PT Home Exercise Plan  eval: HS stretch, STS; 05/08/2017 - LTR, SKTC, DKTC, pelvic tilts; DKTC 2x30sec, DKTC 20-25x AROM repeated BID; 11/13: seated lumbar flexion stretch; 11/19: LTRs; 11/20: walking program; 11/26: bridging, 3D thoracic excur with UE movements, daily walking    Consulted and Agree with Plan of Care  Patient       Patient will benefit from skilled therapeutic intervention in order to improve the following deficits and impairments:  Abnormal gait, Decreased activity tolerance, Decreased balance, Decreased endurance, Decreased mobility, Decreased range of motion, Decreased strength, Difficulty walking, Hypomobility, Increased fascial restricitons, Increased muscle spasms, Impaired flexibility, Improper body mechanics, Postural dysfunction, Pain, Obesity  Visit Diagnosis: Chronic midline low back pain without sciatica  Difficulty in walking, not elsewhere classified  Other symptoms and signs involving the musculoskeletal system  Muscle  weakness (generalized)     Problem List Patient Active Problem List   Diagnosis Date Noted  . Chronic anticoagulation 05/05/2017  . Sleep apnea 05/05/2017  . Edema leg 05/05/2017  . Dysphasia 02/07/2017  . Mild obesity 05/10/2016  . History of colonic polyps   . Diverticulosis of colon without hemorrhage   . Hyperlipidemia LDL goal <130 05/16/2013  . KNEE PAIN 08/07/2009  . KNEE SPRAIN, ACUTE 08/07/2009  . COLONIC POLYPS, HX OF 05/10/2009  . HYPOTHYROIDISM 05/08/2009  . History of bipolar disorder 05/08/2009  . Essential hypertension 05/08/2009  . Permanent atrial fibrillation ([place]) 05/08/2009  . HEMATOCHEZIA 05/08/2009  . LOW BACK PAIN, CHRONIC 05/08/2009  . ABDOMINAL PAIN 05/08/2009       Geraldine Solar PT, DPT  [facility] [address] [place], [place], [address] Phone: [phone]   Fax:  [phone]  Name: EBONY RICKEL MRN: [id number] Date of Birth: 10-29-1945

## 2017-06-09 NOTE — Patient Instructions (Signed)
  BRIDGING  While lying on your back with knees bent, tighten your lower abdominals, squeeze your buttocks and then raise your buttocks off the floor/bed as creating a "Bridge" with your body. Hold and then lower yourself and repeat.  Raise bot toes up in the air so that your heels are the only thing on the bed  Perform 1x/day, 2-3 sets of 10-15 reps

## 2017-06-10 ENCOUNTER — Encounter (HOSPITAL_COMMUNITY): Payer: Self-pay

## 2017-06-10 ENCOUNTER — Ambulatory Visit (HOSPITAL_COMMUNITY): Payer: Medicare Other

## 2017-06-10 DIAGNOSIS — M545 Low back pain, unspecified: Secondary | ICD-10-CM

## 2017-06-10 DIAGNOSIS — G8929 Other chronic pain: Secondary | ICD-10-CM | POA: Diagnosis not present

## 2017-06-10 DIAGNOSIS — R29898 Other symptoms and signs involving the musculoskeletal system: Secondary | ICD-10-CM | POA: Diagnosis not present

## 2017-06-10 DIAGNOSIS — M6281 Muscle weakness (generalized): Secondary | ICD-10-CM

## 2017-06-10 DIAGNOSIS — R262 Difficulty in walking, not elsewhere classified: Secondary | ICD-10-CM | POA: Diagnosis not present

## 2017-06-10 NOTE — Therapy (Signed)
[place], [place], [address] Phone: [phone]   Fax:  [phone]  Physical Therapy Treatment  Patient Details  Name: Casey [name] MRN: [id number] Date of Birth: Oct 22, 1945 Referring Provider: Glenna Fellows, MD   Encounter Date: 06/10/2017  PT End of Session - 06/10/17 0820    Visit Number  12    Number of Visits  13    Date for PT Re-Evaluation  06/18/17    Authorization Type  Medicare Part A and B (g codes done on visit #7)    Authorization Time Period  05/07/17 to 06/18/17    Authorization - Visit Number  12    Authorization - Number of Visits  13    PT Start Time  7902 first 5 on nustep for mobility, trunk flexion and rotation, not included in charges    PT Stop Time  0902    PT Time Calculation (min)  45 min    Activity Tolerance  Patient tolerated treatment well;No increased pain    Behavior During Therapy  WFL for tasks assessed/performed       Past Medical History:  Diagnosis Date  . Arthritis   . Atrial fibrillation ([place]) 01/12/2010   2D Echo EF=>55%  . Bipolar disorder ([place])   . Chronic back pain   . COPD (chronic obstructive pulmonary disease) ([place])   . Depression   . Dysrhythmia    AFib  . History of colonic polyps   . History of gout   . Hyperlipidemia   . Hypertension   . Hypothyroidism   . Morbid obesity ([place])   . OSA (obstructive sleep apnea)    AHI was 27.62hr RDI was 34.3 hr REM 0.00hr; had CPAP years ago but no longer uses.  . Prostatitis   . Tubular adenoma of colon 01/2016    Past Surgical History:  Procedure Laterality Date  . APPENDECTOMY  1959  . asthma    . CATARACT EXTRACTION Bilateral 1995  . COLONOSCOPY  2011   RMR: left-sided diverticula, minimal rectal friability. No polyps. Repeat 5 years.  . COLONOSCOPY WITH PROPOFOL N/A 01/15/2016   Dr.Rourk- diverticulosis in the entire examined colon, multiple rectal and colonic polyps, internal hemorrhoids. bx= tubular  adenomas and hyperplastic polyps.   Marland Kitchen HAND / FINGER LESION EXCISION Right   . POLYPECTOMY  01/15/2016   Procedure: POLYPECTOMY;  Surgeon: Daneil Dolin, MD;  [facility];  Service: Endoscopy;;  . right shoulder surgery    . TONSILLECTOMY  1955    There were no vitals filed for this visit.  Subjective Assessment - 06/10/17 0817    Subjective  Pt stated he is moving slow with stiffness this morning.  No reports of pain today.  Most difficulty currently putting on socks/shoes    Patient Stated Goals  get back in shape, get socks and shoes back on    Currently in Pain?  No/denies                      [facility] Adult PT Treatment/Exercise - 06/10/17 0001      Posture/Postural Control   Posture/Postural Control  Postural limitations    Postural Limitations  Rounded Shoulders;Forward head;Increased thoracic kyphosis;Increased lumbar lordosis      Lumbar Exercises: Stretches   Quad Stretch Limitations  seated thoracic ext/lumbar flexion stretch and lateral flexion stretch with physioball 5x10" holds each    Piriformis Stretch  2 reps;30 seconds  Piriformis Stretch Limitations  supine      Lumbar Exercises: Aerobic   Stationary Bike  NUSTEP x5 minutes for AA/ROM, L4 hip flexion and trunk rotation      Lumbar Exercises: Standing   Functional Squats  10 reps 3D hip excursion    Other Standing Lumbar Exercises  10 reps of deadlifts from 6" step with 10# DB    Other Standing Lumbar Exercises  2D gait excursion (weight shifting and rotation)      Lumbar Exercises: Seated   Sit to Stand  5 reps 3 sets +    Other Seated Lumbar Exercises  3D thoracic excursions with UE movements x10 reps each    Other Seated Lumbar Exercises  thoracic ext over 1/2 foam roll x5 reps at 3 different segments between T4-12      Lumbar Exercises: Supine   Bridge  10 reps    Straight Leg Raise  -- foam under toes for DF             PT Education - 06/09/17 0817    Education  provided  Yes    Education Details  continued to encourage walking program; added bridging and thoracic excursions with UE movements to HEP    Person(s) Educated  Patient    Methods  Explanation;Demonstration    Comprehension  Verbalized understanding;Returned demonstration       PT Short Term Goals - 05/27/17 0906      PT SHORT TERM GOAL #1   Title  Pt will be independent with HEP and perform consistently to maximize overall function and decrease pain.     Baseline  11/13: "some"    Time  3    Period  Weeks    Status  On-going      PT SHORT TERM GOAL #2   Title  Pt will have 5/5 MMT in bil hip abd to maximize gait and balance.     Baseline  11/13: 4+/5 hip ext BLE    Time  3    Period  Weeks    Status  On-going      PT SHORT TERM GOAL #3   Title  Pt will be modified independent with log rolling to decrease his LBP and maximize his bed mobility.    Time  3    Period  Weeks    Status  Achieved        PT Long Term Goals - 05/27/17 8315      PT LONG TERM GOAL #1   Title  Pt will be able to perform 5xSTS in 18 sec or < to demo improved functional BLE strength.     Baseline  11/13: 17 sec    Time  6    Period  Weeks    Status  Achieved      PT LONG TERM GOAL #2   Title  Pt will be able to perform SLS on BLE for 10 sec with no UE support in order to maximize gait and decrease risk for falls.     Baseline  11/13: 2 sec or < BLE    Time  6    Period  Weeks    Status  On-going      PT LONG TERM GOAL #3   Title  Pt will score a 20/24 on the DGI to demo improved balance and decrease risk for falls.    Baseline  111/13: 18/24    Time  6    Period  Weeks  Status  Partially Met      PT LONG TERM GOAL #4   Title  Pt will have improved 3MWT by 137f or > to demo improved overall tolerance to movement and maximize community access.     Baseline  11/13: 5067f   Time  6    Period  Weeks    Status  On-going      PT LONG TERM GOAL #5   Title  Pt will have improved lumbar  flexion AROM by at least 25% to decrease pain and maximize pt's ability to put on his socks and shoes with less pain.     Baseline  11/13: pt still 50% limited in lumbar flexion AROM and he is still unable to get his socks and shoes    Time  6    Period  Weeks    Status  On-going            Plan - 06/10/17 0912    Clinical Impression Statement  Session focus on spinal mobility and gluteal strengthening.  Added 2D excursion to improve weight shifting and hip rotation with gait as well as additional piriformis stretch as HEP to assist with putting on socks/shoes.  Continued discussion about benefits of walking program, pt reports ability to walk/stand for additional 10 minutes with his dog since he began walking program.  Reports of improved mobilty at EOS, no reports of pain through session.      Rehab Potential  Fair    PT Frequency  2x / week    PT Duration  6 weeks    PT Next Visit Plan  Reassess next session.  Continue lumbar spine flexion and hip flexion (mindful of FAI), strength in hips, and mobility in hip soft tissue; add thoracic and hip excursions to HEP; continue deadlifts, add step up with knee drive next visit    PT Home Exercise Plan  eval: HS stretch, STS; 05/08/2017 - LTR, SKTC, DKTC, pelvic tilts; DKTC 2x30sec, DKTC 20-25x AROM repeated BID; 11/13: seated lumbar flexion stretch; 11/19: LTRs; 11/20: walking program; 11/26: bridging, 3D thoracic excur with UE movements, daily walking; 11/27: supine piriformis stretch       Patient will benefit from skilled therapeutic intervention in order to improve the following deficits and impairments:  Abnormal gait, Decreased activity tolerance, Decreased balance, Decreased endurance, Decreased mobility, Decreased range of motion, Decreased strength, Difficulty walking, Hypomobility, Increased fascial restricitons, Increased muscle spasms, Impaired flexibility, Improper body mechanics, Postural dysfunction, Pain, Obesity  Visit  Diagnosis: Chronic midline low back pain without sciatica  Difficulty in walking, not elsewhere classified  Other symptoms and signs involving the musculoskeletal system  Muscle weakness (generalized)     Problem List Patient Active Problem List   Diagnosis Date Noted  . Chronic anticoagulation 05/05/2017  . Sleep apnea 05/05/2017  . Edema leg 05/05/2017  . Dysphasia 02/07/2017  . Mild obesity 05/10/2016  . History of colonic polyps   . Diverticulosis of colon without hemorrhage   . Hyperlipidemia LDL goal <130 05/16/2013  . KNEE PAIN 08/07/2009  . KNEE SPRAIN, ACUTE 08/07/2009  . COLONIC POLYPS, HX OF 05/10/2009  . HYPOTHYROIDISM 05/08/2009  . History of bipolar disorder 05/08/2009  . Essential hypertension 05/08/2009  . Permanent atrial fibrillation ([place]/25/2010  . HEMATOCHEZIA 05/08/2009  . LOW BACK PAIN, CHRONIC 05/08/2009  . ABDOMINAL PAIN 05/08/2009   CaIhor AustinLPDalworthington GardensCBMarienthalCoAldona Lento1/27/2018, 9:25 AM  [address]  [place], [place], [address] Phone: [phone]   Fax:  [phone]  Name: PIUS BYROM MRN: [id number] Date of Birth: 02/12/1946

## 2017-06-10 NOTE — Patient Instructions (Signed)
Hip Extension    Lie on back, legs in air, knees bent. Grasp hands behind one thigh and cross other leg over same thigh. Hold 30 seconds. Repeat with other leg held. Repeat 3 times. Do 2 sessions per day.  Copyright  VHI. All rights reserved.   

## 2017-06-13 DIAGNOSIS — F319 Bipolar disorder, unspecified: Secondary | ICD-10-CM | POA: Diagnosis not present

## 2017-06-17 ENCOUNTER — Encounter (HOSPITAL_COMMUNITY): Payer: Self-pay

## 2017-06-17 ENCOUNTER — Ambulatory Visit (HOSPITAL_COMMUNITY): Payer: Medicare Other | Attending: Neurosurgery

## 2017-06-17 ENCOUNTER — Other Ambulatory Visit: Payer: Self-pay

## 2017-06-17 DIAGNOSIS — M6281 Muscle weakness (generalized): Secondary | ICD-10-CM | POA: Diagnosis not present

## 2017-06-17 DIAGNOSIS — M545 Low back pain, unspecified: Secondary | ICD-10-CM

## 2017-06-17 DIAGNOSIS — G8929 Other chronic pain: Secondary | ICD-10-CM

## 2017-06-17 DIAGNOSIS — R29898 Other symptoms and signs involving the musculoskeletal system: Secondary | ICD-10-CM | POA: Diagnosis not present

## 2017-06-17 DIAGNOSIS — R262 Difficulty in walking, not elsewhere classified: Secondary | ICD-10-CM

## 2017-06-17 NOTE — Therapy (Signed)
[facility], [place], [address] Phone: [phone]   Fax:  [phone]  Physical Therapy Treatment/Reassessment  Patient Details  Name: Casey [name] MRN: [id number] Date of Birth: 1946-06-21 Referring Provider: Glenna Fellows, MD   Encounter Date: 06/17/2017  PT End of Session - 06/17/17 1305    Visit Number  13    Number of Visits  21    Date for PT Re-Evaluation  06/18/17    Authorization Type  Medicare Part A and B (g codes done on visit #13)    Authorization Time Period  05/07/17 to 06/18/17; NEW: 06/17/17 to 07/15/16    Authorization - Visit Number  13    Authorization - Number of Visits  21    PT Start Time  1300    PT Stop Time  1344    PT Time Calculation (min)  44 min    Activity Tolerance  Patient tolerated treatment well;No increased pain    Behavior During Therapy  WFL for tasks assessed/performed       Past Medical History:  Diagnosis Date  . Arthritis   . Atrial fibrillation ([place]) 01/12/2010   2D Echo EF=>55%  . Bipolar disorder ([place])   . Chronic back pain   . COPD (chronic obstructive pulmonary disease) ([place])   . Depression   . Dysrhythmia    AFib  . History of colonic polyps   . History of gout   . Hyperlipidemia   . Hypertension   . Hypothyroidism   . Morbid obesity ([place])   . OSA (obstructive sleep apnea)    AHI was 27.62hr RDI was 34.3 hr REM 0.00hr; had CPAP years ago but no longer uses.  . Prostatitis   . Tubular adenoma of colon 01/2016    Past Surgical History:  Procedure Laterality Date  . APPENDECTOMY  1959  . asthma    . CATARACT EXTRACTION Bilateral 1995  . COLONOSCOPY  2011   RMR: left-sided diverticula, minimal rectal friability. No polyps. Repeat 5 years.  . COLONOSCOPY WITH PROPOFOL N/A 01/15/2016   Dr.Rourk- diverticulosis in the entire examined colon, multiple rectal and colonic polyps, internal hemorrhoids. bx= tubular adenomas and hyperplastic polyps.   Marland Kitchen HAND / FINGER  LESION EXCISION Right   . POLYPECTOMY  01/15/2016   Procedure: POLYPECTOMY;  Surgeon: Daneil Dolin, MD;  [facility];  Service: Endoscopy;;  . right shoulder surgery    . TONSILLECTOMY  1955    There were no vitals filed for this visit.      [facility] PT Assessment - 06/17/17 0001      AROM   Lumbar Flexion  50% limited, increased lordosis during flexion was 50% limited      Strength   Right Hip ABduction  5/5 was 4+    Left Hip ABduction  4+/5 was 4+      Ambulation/Gait   Ambulation Distance (Feet)  582 Feet 3MWT      Balance   Balance Assessed  Yes      Static Standing Balance   Static Standing - Balance Support  No upper extremity supported    Static Standing Balance -  Activities   Single Leg Stance - Right Leg;Single Leg Stance - Left Leg    Static Standing - Comment/# of Minutes  3 sec or < BLE      Dynamic Gait Index   Level Surface  Normal    Change in Gait Speed  Normal  Gait with Horizontal Head Turns  Normal    Gait with Vertical Head Turns  Normal    Gait and Pivot Turn  Mild Impairment    Step Over Obstacle  Normal    Step Around Obstacles  Normal    Steps  Mild Impairment    Total Score  22           SFMA Top Tier: Cervical Flexion: DN Cervical Extension: DP (recreated LBP) Cervical Rotation: L: DP (recreated L hip pain), R: DN Upper Extremity Pattern 1 (MRE): DN bil Upper Extremity Pattern 2 (LRF): DN bil Multi-Segmental Flexion: DN Multi-Segmental Extension: DN Multi-Segmental Rotation: L DN, R DP (recreated R hip pain) Single-Leg Stance: DN Arms Down Deep Squat: DN        [facility] Adult PT Treatment/Exercise - 06/17/17 0001      Lumbar Exercises: Aerobic   Stationary Bike  NUSTEP x5 minutes for AA/ROM, L4 hip flexion and trunk rotation           PT Education - 06/17/17 1305    Education provided  Yes    Education Details  reassessment findings, SFMA and will continue breakouts for SFMA next visit    Person(s)  Educated  Patient    Methods  Explanation    Comprehension  Verbalized understanding       PT Short Term Goals - 06/17/17 1325      PT SHORT TERM GOAL #1   Title  Pt will be independent with HEP and perform consistently to maximize overall function and decrease pain.     Baseline  12/74: "some;" tries to do them everyday    Time  3    Period  Weeks    Status  On-going      PT SHORT TERM GOAL #2   Title  Pt will have 5/5 MMT in bil hip abd to maximize gait and balance.     Baseline  12/4: L hip abd 4+/5    Time  3    Period  Weeks    Status  Partially Met      PT SHORT TERM GOAL #3   Title  Pt will be modified independent with log rolling to decrease his LBP and maximize his bed mobility.    Time  3    Period  Weeks    Status  Achieved      PT SHORT TERM GOAL #4   Title  Pt wll report no LBP during cervical extension, no L hip pain during L cervical rotation, and no R hip pain during right multi-segmental rotation on the SFMA Top Tier to demonstrate improved mobility, decrease pain, and improved overall function    Time  4    Period  Weeks    Status  New    Target Date  07/15/17        PT Long Term Goals - 06/17/17 1326      PT LONG TERM GOAL #1   Title  Pt will be able to perform 5xSTS in 18 sec or < to demo improved functional BLE strength.     Baseline  11/13: 17 sec    Time  6    Period  Weeks    Status  Achieved      PT LONG TERM GOAL #2   Title  Pt will be able to perform SLS on BLE for 10 sec with no UE support in order to maximize gait and decrease risk for falls.  Baseline  12/4: 3 sec or < BLE    Time  6    Period  Weeks    Status  On-going      PT LONG TERM GOAL #3   Title  Pt will score a 20/24 on the DGI to demo improved balance and decrease risk for falls.    Baseline  12/4: 22/24    Time  6    Period  Weeks    Status  Achieved      PT LONG TERM GOAL #4   Title  Pt will have improved 3MWT by 125f or > to demo improved overall tolerance to  movement and maximize community access.     Baseline  12/4: 5822f(at initial eval was [id number] reass was [id number]  Time  6    Period  Weeks    Status  On-going      PT LONG TERM GOAL #5   Title  Pt will have improved lumbar flexion AROM by at least 25% to decrease pain and maximize pt's ability to put on his socks and shoes with less pain.     Baseline  12/4: pt still 50% limited in lumbar flexion AROM and he is still unable to get his socks and shoes    Time  6    Period  Weeks    Status  On-going            Plan - 06/17/17 1648    Clinical Impression Statement  PT reassessed pt's goals and outcome measures this date. Pt has made good progress towards goals as illustrated above. Pt's DGI has significantly improved as he was 22/24 this date, indicating he is WNL for his dynamic balance. Pt's lumbar ROM, SLS, and ambulation still deficient. He still demo's increased lumbar lordosis during trunk flexion and he is still unable to get socks and laced shoes on. PT performed SFMA Top Tier with pt this date and he was noted to have LBP with cervical extension, L hip pain with L cervical rotation, and R hip pain with R multi-segmental flexion. Unable to get to breakouts this date due to time constraints and will continue next visit. Recommend extension of PT services for 2x/week for 4 weeks to address the mobility and/or stability motor control deficits identified during SFMA breakouts in order to continue to promote progress towards goals and improve overall function.     Rehab Potential  Fair    PT Frequency  2x / week    PT Duration  6 weeks    PT Next Visit Plan  SFMA breakouts    PT Home Exercise Plan  eval: HS stretch, STS; 05/08/2017 - LTR, SKTC, DKTC, pelvic tilts; DKTC 2x30sec, DKTC 20-25x AROM repeated BID; 11/13: seated lumbar flexion stretch; 11/19: LTRs; 11/20: walking program; 11/26: bridging, 3D thoracic excur with UE movements, daily walking; 11/27: supine piriformis stretch     Consulted and Agree with Plan of Care  Patient       Patient will benefit from skilled therapeutic intervention in order to improve the following deficits and impairments:  Abnormal gait, Decreased activity tolerance, Decreased balance, Decreased endurance, Decreased mobility, Decreased range of motion, Decreased strength, Difficulty walking, Hypomobility, Increased fascial restricitons, Increased muscle spasms, Impaired flexibility, Improper body mechanics, Postural dysfunction, Pain, Obesity  Visit Diagnosis: Chronic midline low back pain without sciatica - Plan: PT plan of care cert/re-cert  Difficulty in walking, not elsewhere classified - Plan: PT plan of care cert/re-cert  Other symptoms and signs involving the musculoskeletal system - Plan: PT plan of care cert/re-cert  Muscle weakness (generalized) - Plan: PT plan of care cert/re-cert   G-Codes - 94/32/00 1649    Functional Assessment Tool Used (Outpatient Only)  FOTO, clinical judgement, 5xSTS, SLS, gait, MMT    Functional Limitation  Mobility: Walking and moving around    Mobility: Walking and Moving Around Current Status ([id number])  At least 20 percent but less than 40 percent impaired, limited or restricted    Mobility: Walking and Moving Around Goal Status [phone])  At least 20 percent but less than 40 percent impaired, limited or restricted       Problem List Patient Active Problem List   Diagnosis Date Noted  . Chronic anticoagulation 05/05/2017  . Sleep apnea 05/05/2017  . Edema leg 05/05/2017  . Dysphasia 02/07/2017  . Mild obesity 05/10/2016  . History of colonic polyps   . Diverticulosis of colon without hemorrhage   . Hyperlipidemia LDL goal <130 05/16/2013  . KNEE PAIN 08/07/2009  . KNEE SPRAIN, ACUTE 08/07/2009  . COLONIC POLYPS, HX OF 05/10/2009  . HYPOTHYROIDISM 05/08/2009  . History of bipolar disorder 05/08/2009  . Essential hypertension 05/08/2009  . Permanent atrial fibrillation ([place]) 05/08/2009  .  HEMATOCHEZIA 05/08/2009  . LOW BACK PAIN, CHRONIC 05/08/2009  . ABDOMINAL PAIN 05/08/2009       Geraldine Solar PT, DPT  [facility] [address] [place], [place], [address] Phone: [phone]   Fax:  [phone]  Name: LITTLETON HAUB MRN: [id number] Date of Birth: 06-13-1946

## 2017-06-23 ENCOUNTER — Telehealth: Payer: Self-pay

## 2017-06-23 NOTE — Telephone Encounter (Signed)
Tried to call the pt about his appt tomorrow 06/24/17. The office will be closed d/t the weather. Pt did not answer the phone but I was able to leave a message. I have cancelled his appt, please reschedule.

## 2017-06-24 ENCOUNTER — Ambulatory Visit: Payer: Medicare Other | Admitting: Internal Medicine

## 2017-06-24 ENCOUNTER — Telehealth (HOSPITAL_COMMUNITY): Payer: Self-pay

## 2017-06-24 NOTE — Telephone Encounter (Signed)
He can not get out of this home

## 2017-06-25 ENCOUNTER — Encounter: Payer: Self-pay | Admitting: Internal Medicine

## 2017-06-25 ENCOUNTER — Encounter (HOSPITAL_COMMUNITY): Payer: Medicare Other

## 2017-06-25 NOTE — Telephone Encounter (Signed)
PATIENT RESCHEDULED

## 2017-06-27 ENCOUNTER — Other Ambulatory Visit: Payer: Self-pay

## 2017-06-27 ENCOUNTER — Ambulatory Visit (HOSPITAL_COMMUNITY): Payer: Medicare Other

## 2017-06-27 ENCOUNTER — Encounter (HOSPITAL_COMMUNITY): Payer: Self-pay

## 2017-06-27 DIAGNOSIS — R29898 Other symptoms and signs involving the musculoskeletal system: Secondary | ICD-10-CM

## 2017-06-27 DIAGNOSIS — M545 Low back pain: Principal | ICD-10-CM

## 2017-06-27 DIAGNOSIS — M6281 Muscle weakness (generalized): Secondary | ICD-10-CM | POA: Diagnosis not present

## 2017-06-27 DIAGNOSIS — R262 Difficulty in walking, not elsewhere classified: Secondary | ICD-10-CM

## 2017-06-27 DIAGNOSIS — G8929 Other chronic pain: Secondary | ICD-10-CM | POA: Diagnosis not present

## 2017-06-27 NOTE — Therapy (Signed)
[place], [place], [address] Phone: [phone]   Fax:  [phone]  Physical Therapy Treatment  Patient Details  Name: Casey [name] MRN: [id number] Date of Birth: 08/18/1945 Referring Provider: Glenna Fellows, MD   Encounter Date: 06/27/2017  PT End of Session - 06/27/17 1429    Visit Number  14    Number of Visits  21    Date for PT Re-Evaluation  06/18/17    Authorization Type  Medicare Part A and B (g codes done on visit #13)    Authorization Time Period  05/07/17 to 06/18/17; NEW: 06/17/17 to 07/15/16    Authorization - Visit Number  14    Authorization - Number of Visits  21    PT Start Time  1430    PT Stop Time  1512    PT Time Calculation (min)  42 min    Activity Tolerance  Patient tolerated treatment well;No increased pain    Behavior During Therapy  WFL for tasks assessed/performed       Past Medical History:  Diagnosis Date  . Arthritis   . Atrial fibrillation ([place]) 01/12/2010   2D Echo EF=>55%  . Bipolar disorder ([place])   . Chronic back pain   . COPD (chronic obstructive pulmonary disease) ([place])   . Depression   . Dysrhythmia    AFib  . History of colonic polyps   . History of gout   . Hyperlipidemia   . Hypertension   . Hypothyroidism   . Morbid obesity ([place])   . OSA (obstructive sleep apnea)    AHI was 27.62hr RDI was 34.3 hr REM 0.00hr; had CPAP years ago but no longer uses.  . Prostatitis   . Tubular adenoma of colon 01/2016    Past Surgical History:  Procedure Laterality Date  . APPENDECTOMY  1959  . asthma    . CATARACT EXTRACTION Bilateral 1995  . COLONOSCOPY  2011   RMR: left-sided diverticula, minimal rectal friability. No polyps. Repeat 5 years.  . COLONOSCOPY WITH PROPOFOL N/A 01/15/2016   Dr.Rourk- diverticulosis in the entire examined colon, multiple rectal and colonic polyps, internal hemorrhoids. bx= tubular adenomas and hyperplastic polyps.   Marland Kitchen HAND / FINGER LESION  EXCISION Right   . POLYPECTOMY  01/15/2016   Procedure: POLYPECTOMY;  Surgeon: Daneil Dolin, MD;  Location: [facility];  Service: Endoscopy;;  . right shoulder surgery    . TONSILLECTOMY  1955    There were no vitals filed for this visit.  Subjective Assessment - 06/27/17 1429    Subjective  Pt states that he feels pretty good today, no pain.     Patient Stated Goals  get back in shape, get socks and shoes back on    Currently in Pain?  No/denies           SFMA Top Tier:   FN FP DP DN  Cervical Flexion    X  Cervical Extension   X - recreated LBP   Cervical Rotation L   X - recreated L hip pain    R    X  Upper Extremity 1 (MRE) L     X   R    X  Upper Extremity 2 (LRF) L    X   R    X  Multi-Segmental Flexion    X  Multi-Segmental Extension    X  Multi-Segmental Rotation L    X   R  X - recreated R hip pain   Single-Leg Stance L    X   R    X  Arms Down Deep Squat    X    SFMA Breakouts:  Cervical Flexion:   FN FP DP DN  Active Supine Cervical Flexion Test (chin to chest)    X  Passive Supine Cervical Flexion Test L    X   R    X  Active OA Cervical Flexion Test (20*)  L    X   R    X     Cervical Extension:  FN FP DP DN  Supine Cervical Extension    X      Cervical Rotation:  FN FP DP DN  Active Supine Cervical Rotation Test (80*) L     X   R    X  Passive Supine Cervical Rotation Test (80*) L    X   R    X  C1-C2 Cervical Rotation Test (40*)  L    X   R    X       Upper Extremity Pattern 1 (MRE):  FN FP DP DN  Active Lumbar Lock (IR) Extension/ Rotation Test (50*) L    X   R    X  Passive Lumbar Lock (IR) Extension/ Rotation Test (50*) L   X - increased LBP    R   X - increased LBP   Active Prone UE pattern 1 test L    X   R    X  Passive Prone UE pattern 1 test L    X   R    X  Active Prone Shoulder 90/90 IR test (60* &/or total arc 150*) L    48* (ER 52*)   R    IR 8* (ER 57*)  Passive Prone Shoulder 90/90 IR test (60* &/or  total arc 150*) L 68* (ER 61*)      R    20* (ER 57*)  Active prone shoulder extension test (50*) L    X   R    X  Passive Prone Shoulder Extension Test (50*) L    X   R    X  Active Prone Elbow Flexion Test (shoulder extended) L    X   R    X  Passive Prone Elbow Flexion Test (shoulder extended) L    X   R    X     Upper Extremity Pattern 2 (LRF):  FN FP DP DN  Active Lumbar Lock (IR) Extension/ Rotation Test (50*) L    X   R    X  Passive Lumbar Lock (IR) Extension/ Rotation Test (50*) L   X    R   X   Active Prone UE pattern 2 test L    X   R    X  Passive Prone UE pattern 2 test L    X   R    X  Active Prone Shoulder 90/90 ER test (90* &/or total arc 150*) L       R      Passive Prone Shoulder 90/90 ER test (90* &/or total arc 150*) L       R      Active prone shoulder flexion/abduction test (180*) L    X   R    X  Passive Prone Shoulder Flexion/abduction Test (1700*) L    X   R  X  Active Prone Elbow Flexion Test (shoulder flexed) L    X   R    X  Passive Prone Elbow Flexion Test (shoulder flexed) L    X   R    X    Multi-Segmental Flexion:  FN FP DP DN  Long sitting test      Active SLR Test (70*) L       R      Stabilized ASLR Test (70*) L       R      Passive SLR Test (80*) L       R      Supine Knee to Chest Test (120*)      Prone Rocking Test        Multi-Segmental Extension:  FN FP DP DN  Spine/Upper Body Flowchart  Prone Press Up Test    X  Active Lumbar Lock (IR) Extension/ Rotation Test (50*) L       R      Passive Lumbar Lock (IR) Extension/ Rotation Test (50*) L       R      Active Prone on Elbow Extension/Rotation Test (30*) L       R      Passive Prone on Elbow Extension/Rotation Test (30*) L       R      Active Prone Shoulder Girdle Flexion Test (170*) L    X   R    X  Passive Prone Shoulder Girdle Flexion Test (170*) L    very close to 170*   R    X  Lower Body Flowchart  FABER L       R      Stabilized FABER L       R       Modified Thomas Test L       R      Active Hip Extension Test (10*) L       R      Passive Hip Extension Test (10*) L       R        Multi-Segmental Rotation: Spine/Upper Body Flowchart   FN FP DP DN  Seated [place]so Rotation Test (50*) L       R      Active Lumbar Lock (IR) Extension/ Rotation Test (50*) L       R      Passive Lumbar Lock (IR) Extension/ Rotation Test (50*) L       R      Active Prone on Elbow Extension/Rotation Test (30*) L       R      Passive Prone on Elbow Extension/Rotation Test (30*) L       R      Lower Quarter Flowchart   FN FP DP DN  Active Prone Hip ER Test (40*) L 49*      R 53*     Active Prone Hip IR Test (30*) L    16*   R    11*  Stabilized Prone Hip ER Test (40*) L       R      Stabilized Prone Hip IR Test (30*) L     26*   R    12*  Passive Prone Hip ER Test (40*) L       R      Passive Prone Hip IR Test (30*) L    23*   R  23*  Active Seated Tibial ER Test (20*) L       R      Active Seated Tibial IR Test (20*) L       R      Passive Tibial ER Test (20*) L       R      Passive Tibial IR Test (20*) L       R        Single-Leg Stance: Vestibular & Core   FN FP DP DN  Vestibular Test - CTSIB (Static Head on foam) (EO > EC)      CTSIB (Dynamic Head Movement on foam) (EO > EC)      Half-kneeling Narrow Base Test L       R      Quadruped Diagonals Test L       R      Ankle Flowchart  Active Tandem DF - Knee Extended Test  L       R      Passive Prone DF - Knee Extended Test (20*) L       R      Active Tandem PF Test (40*) L       R      Passive Prone PF Test (40*) L       R      Active Seated Ankle Inversion Test  L       R      Passive Ankle Inversion Test  L       R      Active Ankle Eversion Test L       R      Passive Ankle Eversion Test L       R        Arms Down Deep Squat:  FN FP DP DN  Active Tandem DF - Knee Flexed Test (40*) L       R      Passive Prone DF - Knee Flexed Test (30*) L       R       Active Seated Ankle Inversion/Eversion Test  L       R      Passive Seated Ankle Inversion/Eversion Test L       R      Supine Knees to Chest Holding Shins Test (120*)       Supine Knees to Chest Holding Thighs Test (120*)      Active Seated Hip IR Test (30*) L    24*    R    18*  Active Seated Hip ER Test (40*) L    14*   R    15*  Passive Seated Hip IR Test (30*) L    9*   R    17*  Passive Seated Hip ER Test (40*) L    26*   R    17*         PT Education - 06/27/17 1429    Education provided  Yes    Education Details  SFMA breakouts/logic    Person(s) Educated  Patient    Methods  Explanation    Comprehension  Verbalized understanding       PT Short Term Goals - 06/17/17 1325      PT SHORT TERM GOAL #1   Title  Pt will be independent with HEP and perform consistently to maximize overall function and decrease pain.     Baseline  12/74: "some;" tries  to do them everyday    Time  3    Period  Weeks    Status  On-going      PT SHORT TERM GOAL #2   Title  Pt will have 5/5 MMT in bil hip abd to maximize gait and balance.     Baseline  12/4: L hip abd 4+/5    Time  3    Period  Weeks    Status  Partially Met      PT SHORT TERM GOAL #3   Title  Pt will be modified independent with log rolling to decrease his LBP and maximize his bed mobility.    Time  3    Period  Weeks    Status  Achieved      PT SHORT TERM GOAL #4   Title  Pt wll report no LBP during cervical extension, no L hip pain during L cervical rotation, and no R hip pain during right multi-segmental rotation on the SFMA Top Tier to demonstrate improved mobility, decrease pain, and improved overall function    Time  4    Period  Weeks    Status  New    Target Date  07/15/17        PT Long Term Goals - 06/17/17 1326      PT LONG TERM GOAL #1   Title  Pt will be able to perform 5xSTS in 18 sec or < to demo improved functional BLE strength.     Baseline  11/13: 17 sec    Time  6    Period  Weeks     Status  Achieved      PT LONG TERM GOAL #2   Title  Pt will be able to perform SLS on BLE for 10 sec with no UE support in order to maximize gait and decrease risk for falls.     Baseline  12/4: 3 sec or < BLE    Time  6    Period  Weeks    Status  On-going      PT LONG TERM GOAL #3   Title  Pt will score a 20/24 on the DGI to demo improved balance and decrease risk for falls.    Baseline  12/4: 22/24    Time  6    Period  Weeks    Status  Achieved      PT LONG TERM GOAL #4   Title  Pt will have improved 3MWT by 179f or > to demo improved overall tolerance to movement and maximize community access.     Baseline  12/4: 582f(at initial eval was [id number] reass was [id number]  Time  6    Period  Weeks    Status  On-going      PT LONG TERM GOAL #5   Title  Pt will have improved lumbar flexion AROM by at least 25% to decrease pain and maximize pt's ability to put on his socks and shoes with less pain.     Baseline  12/4: pt still 50% limited in lumbar flexion AROM and he is still unable to get his socks and shoes    Time  6    Period  Weeks    Status  On-going            Plan - 06/27/17 1520    Clinical Impression Statement  Began SFMA breakouts following Top Tier performed last visit. Pt had difficulty with prone position  so placed 2 pillows under hips. Due to difficulty with position, completed all breakouts in prone position that were able to be completed (did not assess ankle or tibial PROM as these breakouts were not completed in sitting or standing this date). Pt reported stiffness throughout but no recreation of LBP. He did state that the prone position he was in was difficult on his lower back but none of the breakouts increased his LBP in this position. The only breakout that increased his LBP was the passive lumbar lock (IR) extension/rotation test. Pt's bil UE and hips generally stiff throughout and all DN. Will continue breakouts next visit.     Rehab Potential   Fair    PT Frequency  2x / week    PT Duration  6 weeks    PT Next Visit Plan  cotninue SFMA breakouts and address DN's identified    PT Home Exercise Plan  eval: HS stretch, STS; 05/08/2017 - LTR, SKTC, DKTC, pelvic tilts; DKTC 2x30sec, DKTC 20-25x AROM repeated BID; 11/13: seated lumbar flexion stretch; 11/19: LTRs; 11/20: walking program; 11/26: bridging, 3D thoracic excur with UE movements, daily walking; 11/27: supine piriformis stretch    Consulted and Agree with Plan of Care  Patient       Patient will benefit from skilled therapeutic intervention in order to improve the following deficits and impairments:  Abnormal gait, Decreased activity tolerance, Decreased balance, Decreased endurance, Decreased mobility, Decreased range of motion, Decreased strength, Difficulty walking, Hypomobility, Increased fascial restricitons, Increased muscle spasms, Impaired flexibility, Improper body mechanics, Postural dysfunction, Pain, Obesity  Visit Diagnosis: Chronic midline low back pain without sciatica  Difficulty in walking, not elsewhere classified  Other symptoms and signs involving the musculoskeletal system  Muscle weakness (generalized)     Problem List Patient Active Problem List   Diagnosis Date Noted  . Chronic anticoagulation 05/05/2017  . Sleep apnea 05/05/2017  . Edema leg 05/05/2017  . Dysphasia 02/07/2017  . Mild obesity 05/10/2016  . History of colonic polyps   . Diverticulosis of colon without hemorrhage   . Hyperlipidemia LDL goal <130 05/16/2013  . KNEE PAIN 08/07/2009  . KNEE SPRAIN, ACUTE 08/07/2009  . COLONIC POLYPS, HX OF 05/10/2009  . HYPOTHYROIDISM 05/08/2009  . History of bipolar disorder 05/08/2009  . Essential hypertension 05/08/2009  . Permanent atrial fibrillation ([place]) 05/08/2009  . HEMATOCHEZIA 05/08/2009  . LOW BACK PAIN, CHRONIC 05/08/2009  . ABDOMINAL PAIN 05/08/2009      Geraldine Solar PT, DPT  [facility] [address] [place], [place], [address] Phone: [phone]   Fax:  [phone]  Name: Casey [name] MRN: [id number] Date of Birth: 03/30/1946

## 2017-06-30 ENCOUNTER — Ambulatory Visit (HOSPITAL_COMMUNITY): Payer: Medicare Other

## 2017-06-30 ENCOUNTER — Other Ambulatory Visit: Payer: Self-pay

## 2017-06-30 ENCOUNTER — Encounter (HOSPITAL_COMMUNITY): Payer: Self-pay

## 2017-06-30 DIAGNOSIS — G8929 Other chronic pain: Secondary | ICD-10-CM | POA: Diagnosis not present

## 2017-06-30 DIAGNOSIS — R262 Difficulty in walking, not elsewhere classified: Secondary | ICD-10-CM

## 2017-06-30 DIAGNOSIS — R29898 Other symptoms and signs involving the musculoskeletal system: Secondary | ICD-10-CM | POA: Diagnosis not present

## 2017-06-30 DIAGNOSIS — M545 Low back pain: Principal | ICD-10-CM

## 2017-06-30 DIAGNOSIS — M6281 Muscle weakness (generalized): Secondary | ICD-10-CM | POA: Diagnosis not present

## 2017-06-30 NOTE — Therapy (Signed)
[place], [place], [address] Phone: [phone]   Fax:  [phone]  Physical Therapy Treatment  Patient Details  Name: Casey [name] MRN: [id number] Date of Birth: Apr 30, 1946 Referring Provider: Glenna Fellows, MD   Encounter Date: 06/30/2017  PT End of Session - 06/30/17 1430    Visit Number  15    Number of Visits  21    Date for PT Re-Evaluation  06/18/17    Authorization Type  Medicare Part A and B (g codes done on visit #13)    Authorization Time Period  05/07/17 to 06/18/17; NEW: 06/17/17 to 07/15/16    Authorization - Visit Number  15    Authorization - Number of Visits  21    PT Start Time  2595    PT Stop Time  6387    PT Time Calculation (min)  45 min    Activity Tolerance  Patient tolerated treatment well;No increased pain    Behavior During Therapy  WFL for tasks assessed/performed       Past Medical History:  Diagnosis Date  . Arthritis   . Atrial fibrillation ([place]) 01/12/2010   2D Echo EF=>55%  . Bipolar disorder ([place])   . Chronic back pain   . COPD (chronic obstructive pulmonary disease) ([facility])   . Depression   . Dysrhythmia    AFib  . History of colonic polyps   . History of gout   . Hyperlipidemia   . Hypertension   . Hypothyroidism   . Morbid obesity ([place])   . OSA (obstructive sleep apnea)    AHI was 27.62hr RDI was 34.3 hr REM 0.00hr; had CPAP years ago but no longer uses.  . Prostatitis   . Tubular adenoma of colon 01/2016    Past Surgical History:  Procedure Laterality Date  . APPENDECTOMY  1959  . asthma    . CATARACT EXTRACTION Bilateral 1995  . COLONOSCOPY  2011   RMR: left-sided diverticula, minimal rectal friability. No polyps. Repeat 5 years.  . COLONOSCOPY WITH PROPOFOL N/A 01/15/2016   Dr.Rourk- diverticulosis in the entire examined colon, multiple rectal and colonic polyps, internal hemorrhoids. bx= tubular adenomas and hyperplastic polyps.   Marland Kitchen HAND / FINGER LESION  EXCISION Right   . POLYPECTOMY  01/15/2016   Procedure: POLYPECTOMY;  Surgeon: Daneil Dolin, MD;  Location: [facility];  Service: Endoscopy;;  . right shoulder surgery    . TONSILLECTOMY  1955    There were no vitals filed for this visit.     [facility] Adult PT Treatment/Exercise - 06/30/17 0001      Lumbar Exercises: Aerobic   Stationary Bike  NUSTEP x5 minutes for AA/ROM, L4 hip flexion and trunk rotation          SFMA Top Tier:   FN FP DP DN  Cervical Flexion    X  Cervical Extension   X - recreated LBP   Cervical Rotation L   X - recreated L hip pain    R    X  Upper Extremity 1 (MRE) L     X   R    X  Upper Extremity 2 (LRF) L    X   R    X  Multi-Segmental Flexion    X  Multi-Segmental Extension    X  Multi-Segmental Rotation L    X   R   X - recreated R hip pain   Single-Leg Stance L  X   R    X  Arms Down Deep Squat    X    SFMA Breakouts:  Cervical Flexion:   FN FP DP DN  Active Supine Cervical Flexion Test (chin to chest)    X  Passive Supine Cervical Flexion Test L    X   R    X  Active OA Cervical Flexion Test (20*)  L    X   R    X     Cervical Extension:  FN FP DP DN  Supine Cervical Extension    X      Cervical Rotation:  FN FP DP DN  Active Supine Cervical Rotation Test (80*) L     X   R    X  Passive Supine Cervical Rotation Test (80*) L    X   R    X  C1-C2 Cervical Rotation Test (40*)  L    X   R    X       Upper Extremity Pattern 1 (MRE):  FN FP DP DN  Active Lumbar Lock (IR) Extension/ Rotation Test (50*) L    X   R    X  Passive Lumbar Lock (IR) Extension/ Rotation Test (50*) L   X - increased LBP    R   X - increased LBP   Active Prone UE pattern 1 test L    X   R    X  Passive Prone UE pattern 1 test L    X   R    X  Active Prone Shoulder 90/90 IR test (60* &/or total arc 150*) L    48*   R    8*  Passive Prone Shoulder 90/90 IR test (60* &/or total arc 150*) L 68*      R    20*  Active prone shoulder  extension test (50*) L    X   R    X  Passive Prone Shoulder Extension Test (50*) L    X   R    X  Active Prone Elbow Flexion Test (shoulder extended) L    X   R    X  Passive Prone Elbow Flexion Test (shoulder extended) L    X   R    X  - Thoracic extension/rotation MD with pain/dysfunction passively - SMCD of L shoulder IR, MD of R shoulder IR, with shoulder ext and elbow flexion MD with shoulder extended  Upper Extremity Pattern 2 (LRF):  FN FP DP DN  Active Lumbar Lock (IR) Extension/ Rotation Test (50*) L    X   R    X  Passive Lumbar Lock (IR) Extension/ Rotation Test (50*) L   X - increased LBP    R   X - increased LBP   Active Prone UE pattern 2 test L    X   R    X  Passive Prone UE pattern 2 test L    X   R    X  Active Prone Shoulder 90/90 ER test (90* &/or total arc 150*) L    52*   R    57*  Passive Prone Shoulder 90/90 ER test (90* &/or total arc 150*) L    61*   R    57*  Active prone shoulder flexion/abduction test (180*) L    X   R    X  Passive Prone Shoulder Flexion/abduction Test (1700*)  L    X   R    X  Active Prone Elbow Flexion Test (shoulder flexed) L    X   R    X  Passive Prone Elbow Flexion Test (shoulder flexed) L    X   R    X  - Thoracic extension/rotation MD with pain/dysfunction passively - Bil shoulder flexion/abd, ER, and elbow flexion with shoulder flexed MD throughout    Multi-Segmental Flexion:  FN FP DP DN  Long sitting test               X  Active SLR Test (70*) L              51*   R              51*  Stabilized ASLR Test (70*) L    X - no real change   R    X - no real change  Passive SLR Test (80*) L    X - no real change   R    X - no real change  Supine Knee to Chest Test (120*)    97*  Prone Rocking Test   X - LBP   - Potential posterior chain tightness along with hip flexion mobility deficits, BLE    Multi-Segmental Extension:  FN FP DP DN  Spine/Upper Body Flowchart  Prone Press Up Test    X  Active Lumbar Lock (IR)  Extension/ Rotation Test (50*) L    X   R    X  Passive Lumbar Lock (IR) Extension/ Rotation Test (50*) L   X - increased LBP    R   X - increased LBP   Active Prone on Elbow Extension/Rotation Test (30*) L       R      Passive Prone on Elbow Extension/Rotation Test (30*) L       R      Active Prone Shoulder Girdle Flexion Test (170*) L    X   R    X  Passive Prone Shoulder Girdle Flexion Test (170*) L    very close to 170*   R    X  Lower Body Flowchart  FABER L   X - LBP    R   X - LBP   Stabilized FABER L   X - LBP    R   X - LBP   Modified Thomas Test L    X - did not get to //, ~45* hip flexion   R    X - ~15* hip flexion  Active Hip Extension Test (10*) L       R      Passive Hip Extension Test (10*) L       R      - Bil FABER caused LBP plus tight hip flexors - Thoracic extension/rotation MD with pain/dysfunction passively  Multi-Segmental Rotation: Spine/Upper Body Flowchart   FN FP DP DN  Seated Torso Rotation Test (50*) L   X - LBP    R    X  Active Lumbar Lock (IR) Extension/ Rotation Test (50*) L    X   R    X  Passive Lumbar Lock (IR) Extension/ Rotation Test (50*) L   X - increased LBP    R   X - increased LBP   Active Prone on Elbow Extension/Rotation Test (30*) L       R  Passive Prone on Elbow Extension/Rotation Test (30*) L       R      Lower Quarter Flowchart   FN FP DP DN  Active Prone Hip ER Test (40*) L 49*      R 53*     Active Prone Hip IR Test (30*) L    16*   R    11*  Stabilized Prone Hip ER Test (40*) L       R      Stabilized Prone Hip IR Test (30*) L     26*   R    12*  Passive Prone Hip ER Test (40*) L       R      Passive Prone Hip IR Test (30*) L    23*   R    23*  Active Seated Tibial ER Test (20*) L X      R X     Active Seated Tibial IR Test (20*) L X      R X     Passive Tibial ER Test (20*) L       R      Passive Tibial IR Test (20*) L       R      - Tibial mobility WNL, bil hip IR MD  Single-Leg  Stance: Vestibular & Core   FN FP DP DN  Vestibular Test - CTSIB (Static Head on foam) (EO > EC)      CTSIB (Dynamic Head Movement on foam) (EO > EC)      Half-kneeling Narrow Base Test L       R      Quadruped Diagonals Test L       R      Ankle Flowchart  Active Tandem DF - Knee Extended Test  L    X   R    X  Passive Prone DF - Knee Extended Test (20*) L    X - neutral   R    -5*  Active Tandem PF Test (40*) L    X   R    X  Passive Prone PF Test (40*) L 45*      R 45*     Active Seated Ankle Inversion Test  L    X   R    X  Passive Ankle Inversion Test  L    X   R    X  Active Ankle Eversion Test L    X   R    X  Passive Ankle Eversion Test L    X   R    X  - Ankle MD for DF bilaterally with SMCD for PF bilaterally   Arms Down Deep Squat:  FN FP DP DN  Active Tandem DF - Knee Flexed Test (40*) L    X   R    X  Passive Prone DF - Knee Flexed Test (30*) L    +5*   R    8*  Active Seated Ankle Inversion/Eversion Test  L    X - appeared more difficult and patient reported more stiff   R    X  Passive Seated Ankle Inversion/Eversion Test L    X   R    X  Supine Knees to Chest Holding Shins Test (120*)     X  Supine Knees to Chest Holding Thighs Test (120*)    X  Active Seated Hip IR  Test (30*) L    24*    R    18*  Active Seated Hip ER Test (40*) L    14*   R    15*  Passive Seated Hip IR Test (30*) L    9*   R    17*  Passive Seated Hip ER Test (40*) L    26*   R    17*  - Ankle MD for DF with knee flexed, bil hip MD for IR and ER, plus bil ankle inv/ev MD      PT Education - 06/30/17 1601    Education provided  Yes    Education Details  SFMA breakouts/logic and findings from today; will continue them next week and then begin treatments based off of dysfunctions identified    Person(s) Educated  Patient    Methods  Explanation    Comprehension  Verbalized understanding       PT Short Term Goals - 06/17/17 1325      PT SHORT TERM GOAL #1   Title  Pt  will be independent with HEP and perform consistently to maximize overall function and decrease pain.     Baseline  12/74: "some;" tries to do them everyday    Time  3    Period  Weeks    Status  On-going      PT SHORT TERM GOAL #2   Title  Pt will have 5/5 MMT in bil hip abd to maximize gait and balance.     Baseline  12/4: L hip abd 4+/5    Time  3    Period  Weeks    Status  Partially Met      PT SHORT TERM GOAL #3   Title  Pt will be modified independent with log rolling to decrease his LBP and maximize his bed mobility.    Time  3    Period  Weeks    Status  Achieved      PT SHORT TERM GOAL #4   Title  Pt wll report no LBP during cervical extension, no L hip pain during L cervical rotation, and no R hip pain during right multi-segmental rotation on the SFMA Top Tier to demonstrate improved mobility, decrease pain, and improved overall function    Time  4    Period  Weeks    Status  New    Target Date  07/15/17        PT Long Term Goals - 06/17/17 1326      PT LONG TERM GOAL #1   Title  Pt will be able to perform 5xSTS in 18 sec or < to demo improved functional BLE strength.     Baseline  11/13: 17 sec    Time  6    Period  Weeks    Status  Achieved      PT LONG TERM GOAL #2   Title  Pt will be able to perform SLS on BLE for 10 sec with no UE support in order to maximize gait and decrease risk for falls.     Baseline  12/4: 3 sec or < BLE    Time  6    Period  Weeks    Status  On-going      PT LONG TERM GOAL #3   Title  Pt will score a 20/24 on the DGI to demo improved balance and decrease risk for falls.    Baseline  12/4: 22/24  Time  6    Period  Weeks    Status  Achieved      PT LONG TERM GOAL #4   Title  Pt will have improved 3MWT by 168f or > to demo improved overall tolerance to movement and maximize community access.     Baseline  12/4: 5840f(at initial eval was [id number] reass was [id number]  Time  6    Period  Weeks    Status  On-going       PT LONG TERM GOAL #5   Title  Pt will have improved lumbar flexion AROM by at least 25% to decrease pain and maximize pt's ability to put on his socks and shoes with less pain.     Baseline  12/4: pt still 50% limited in lumbar flexion AROM and he is still unable to get his socks and shoes    Time  6    Period  Weeks    Status  On-going            Plan - 06/30/17 1518    Clinical Impression Statement  Continued with SFMA breakouts this date. Pt most notably with very stiff hips and ankles this date. Pt's bil FABER positive for LBP this date, L>R. Explaned logistics of the breakouts and regional interdependence and that next visit would consist of the balance/CTSIB breakout and then treatment for his mobility deficits would begin.     Rehab Potential  Fair    PT Frequency  2x / week    PT Duration  6 weeks    PT Next Visit Plan  cotninue SFMA breakouts and address DN's identified    PT Home Exercise Plan  eval: HS stretch, STS; 05/08/2017 - LTR, SKTC, DKTC, pelvic tilts; DKTC 2x30sec, DKTC 20-25x AROM repeated BID; 11/13: seated lumbar flexion stretch; 11/19: LTRs; 11/20: walking program; 11/26: bridging, 3D thoracic excur with UE movements, daily walking; 11/27: supine piriformis stretch    Consulted and Agree with Plan of Care  Patient       Patient will benefit from skilled therapeutic intervention in order to improve the following deficits and impairments:  Abnormal gait, Decreased activity tolerance, Decreased balance, Decreased endurance, Decreased mobility, Decreased range of motion, Decreased strength, Difficulty walking, Hypomobility, Increased fascial restricitons, Increased muscle spasms, Impaired flexibility, Improper body mechanics, Postural dysfunction, Pain, Obesity  Visit Diagnosis: Chronic midline low back pain without sciatica  Difficulty in walking, not elsewhere classified  Other symptoms and signs involving the musculoskeletal system  Muscle weakness  (generalized)     Problem List Patient Active Problem List   Diagnosis Date Noted  . Chronic anticoagulation 05/05/2017  . Sleep apnea 05/05/2017  . Edema leg 05/05/2017  . Dysphasia 02/07/2017  . Mild obesity 05/10/2016  . History of colonic polyps   . Diverticulosis of colon without hemorrhage   . Hyperlipidemia LDL goal <130 05/16/2013  . KNEE PAIN 08/07/2009  . KNEE SPRAIN, ACUTE 08/07/2009  . COLONIC POLYPS, HX OF 05/10/2009  . HYPOTHYROIDISM 05/08/2009  . History of bipolar disorder 05/08/2009  . Essential hypertension 05/08/2009  . Permanent atrial fibrillation ([facility])[place]/25/2010  . HEMATOCHEZIA 05/08/2009  . LOW BACK PAIN, CHRONIC 05/08/2009  . ABDOMINAL PAIN 05/08/2009      BrooGeraldine Solar DPT  [facility] [address], [place]ne: [phone]:  [phone]: EarlADRIAAN MALTESE: [phone] of Birth: 2/26July 29, 1947

## 2017-07-02 ENCOUNTER — Other Ambulatory Visit: Payer: Self-pay

## 2017-07-02 ENCOUNTER — Encounter (HOSPITAL_COMMUNITY): Payer: Self-pay

## 2017-07-02 ENCOUNTER — Ambulatory Visit (HOSPITAL_COMMUNITY): Payer: Medicare Other

## 2017-07-02 DIAGNOSIS — G8929 Other chronic pain: Secondary | ICD-10-CM

## 2017-07-02 DIAGNOSIS — R262 Difficulty in walking, not elsewhere classified: Secondary | ICD-10-CM

## 2017-07-02 DIAGNOSIS — R29898 Other symptoms and signs involving the musculoskeletal system: Secondary | ICD-10-CM

## 2017-07-02 DIAGNOSIS — M545 Low back pain, unspecified: Secondary | ICD-10-CM

## 2017-07-02 DIAGNOSIS — M6281 Muscle weakness (generalized): Secondary | ICD-10-CM | POA: Diagnosis not present

## 2017-07-02 NOTE — Therapy (Signed)
[place], [place], [address] Phone: [phone]   Fax:  [phone]  Physical Therapy Treatment  Patient Details  Name: Casey [name] MRN: [id number] Date of Birth: [birth year] Referring Provider: Glenna Fellows, MD   Encounter Date: 07/02/2017  PT End of Session - 07/02/17 1124    Visit Number  16    Number of Visits  21    Date for PT Re-Evaluation  06/18/17    Authorization Type  Medicare Part A and B (g codes done on visit #13)    Authorization Time Period  06/17/17 to 07/15/16; NEW; 07/15/17 to 07/30/17    Authorization - Visit Number  16    Authorization - Number of Visits  21    PT Start Time  3762    PT Stop Time  8315    PT Time Calculation (min)  47 min    Activity Tolerance  Patient tolerated treatment well;No increased pain    Behavior During Therapy  WFL for tasks assessed/performed       Past Medical History:  Diagnosis Date  . Arthritis   . Atrial fibrillation ([place]) 01/12/2010   2D Echo EF=>55%  . Bipolar disorder ([place])   . Chronic back pain   . COPD (chronic obstructive pulmonary disease) ([place])   . Depression   . Dysrhythmia    AFib  . History of colonic polyps   . History of gout   . Hyperlipidemia   . Hypertension   . Hypothyroidism   . Morbid obesity ([place])   . OSA (obstructive sleep apnea)    AHI was 27.62hr RDI was 34.3 hr REM 0.00hr; had CPAP years ago but no longer uses.  . Prostatitis   . Tubular adenoma of colon 01/2016    Past Surgical History:  Procedure Laterality Date  . APPENDECTOMY  1959  . asthma    . CATARACT EXTRACTION Bilateral 1995  . COLONOSCOPY  2011   RMR: left-sided diverticula, minimal rectal friability. No polyps. Repeat 5 years.  . COLONOSCOPY WITH PROPOFOL N/A 01/15/2016   Dr.Rourk- diverticulosis in the entire examined colon, multiple rectal and colonic polyps, internal hemorrhoids. bx= tubular adenomas and hyperplastic polyps.   Marland Kitchen HAND / FINGER LESION  EXCISION Right   . POLYPECTOMY  01/15/2016   Procedure: POLYPECTOMY;  Surgeon: Daneil Dolin, MD;  [facility];  Service: Endoscopy;;  . right shoulder surgery    . TONSILLECTOMY  1955    There were no vitals filed for this visit.  Subjective Assessment - 07/02/17 1123    Subjective  Pt states that his dog, Charlie, gave him a workout this morning when he gave him a walk. He denies any LBP or hip pain currently.    Patient Stated Goals  get back in shape, get socks and shoes back on    Currently in Pain?  No/denies         SFMA Top Tier:   FN FP DP DN  Cervical Flexion    X  Cervical Extension   X - recreated LBP   Cervical Rotation L   X - recreated L hip pain    R    X  Upper Extremity 1 (MRE) L     X   R    X  Upper Extremity 2 (LRF) L    X   R    X  Multi-Segmental Flexion    X  Multi-Segmental Extension  X  Multi-Segmental Rotation L    X   R   X - recreated R hip pain   Single-Leg Stance L    X   R    X  Arms Down Deep Squat    X    SFMA Breakouts:  Cervical Flexion:   FN FP DP DN  Active Supine Cervical Flexion Test (chin to chest)    X  Passive Supine Cervical Flexion Test L    X   R    X  Active OA Cervical Flexion Test (20*)  L    X   R    X     Cervical Extension:  FN FP DP DN  Supine Cervical Extension    X      Cervical Rotation:  FN FP DP DN  Active Supine Cervical Rotation Test (80*) L     X   R    X  Passive Supine Cervical Rotation Test (80*) L    X   R    X  C1-C2 Cervical Rotation Test (40*)  L    X   R    X       Upper Extremity Pattern 1 (MRE):  FN FP DP DN  Active Lumbar Lock (IR) Extension/ Rotation Test (50*) L    X   R    X  Passive Lumbar Lock (IR) Extension/ Rotation Test (50*) L   X - increased LBP    R   X - increased LBP   Active Prone UE pattern 1 test L    X   R    X  Passive Prone UE pattern 1 test L    X   R    X  Active Prone Shoulder 90/90 IR test (60* &/or total arc 150*) L    48*   R     8*  Passive Prone Shoulder 90/90 IR test (60* &/or total arc 150*) L 68*      R    20*  Active prone shoulder extension test (50*) L    X   R    X  Passive Prone Shoulder Extension Test (50*) L    X   R    X  Active Prone Elbow Flexion Test (shoulder extended) L    X   R    X  Passive Prone Elbow Flexion Test (shoulder extended) L    X   R    X  - Thoracic extension/rotation MD with pain/dysfunction passively - SMCD of L shoulder IR, MD of R shoulder IR, with shoulder ext and elbow flexion MD with shoulder extended  Upper Extremity Pattern 2 (LRF):  FN FP DP DN  Active Lumbar Lock (IR) Extension/ Rotation Test (50*) L    X   R    X  Passive Lumbar Lock (IR) Extension/ Rotation Test (50*) L   X - increased LBP    R   X - increased LBP   Active Prone UE pattern 2 test L    X   R    X  Passive Prone UE pattern 2 test L    X   R    X  Active Prone Shoulder 90/90 ER test (90* &/or total arc 150*) L    52*   R    57*  Passive Prone Shoulder 90/90 ER test (90* &/or total arc 150*) L    61*   R  57*  Active prone shoulder flexion/abduction test (180*) L    X   R    X  Passive Prone Shoulder Flexion/abduction Test (1700*) L    X   R    X  Active Prone Elbow Flexion Test (shoulder flexed) L    X   R    X  Passive Prone Elbow Flexion Test (shoulder flexed) L    X   R    X  - Thoracic extension/rotation MD with pain/dysfunction passively - Bil shoulder flexion/abd, ER, and elbow flexion with shoulder flexed MD throughout    Multi-Segmental Flexion:  FN FP DP DN  Long sitting test               X  Active SLR Test (70*) L              51*   R              51*  Stabilized ASLR Test (70*) L    X - no real change   R    X - no real change  Passive SLR Test (80*) L    X - no real change   R    X - no real change  Supine Knee to Chest Test (120*)    97*  Prone Rocking Test   X - LBP   - Potential posterior chain tightness along with hip flexion mobility deficits, BLE     Multi-Segmental Extension:  FN FP DP DN  Spine/Upper Body Flowchart  Prone Press Up Test    X  Active Lumbar Lock (IR) Extension/ Rotation Test (50*) L    X   R    X  Passive Lumbar Lock (IR) Extension/ Rotation Test (50*) L   X - increased LBP    R   X - increased LBP   Active Prone on Elbow Extension/Rotation Test (30*) L       R      Passive Prone on Elbow Extension/Rotation Test (30*) L       R      Active Prone Shoulder Girdle Flexion Test (170*) L    X   R    X  Passive Prone Shoulder Girdle Flexion Test (170*) L    very close to 170*   R    X  Lower Body Flowchart  FABER L   X - LBP    R   X - LBP   Stabilized FABER L   X - LBP    R   X - LBP   Modified Thomas Test L    X - did not get to //, ~45* hip flexion   R    X - ~15* hip flexion  Active Hip Extension Test (10*) L       R      Passive Hip Extension Test (10*) L       R      - Bil FABER caused LBP plus tight hip flexors - Thoracic extension/rotation MD with pain/dysfunction passively  Multi-Segmental Rotation: Spine/Upper Body Flowchart   FN FP DP DN  Seated Torso Rotation Test (50*) L   X - LBP    R    X  Active Lumbar Lock (IR) Extension/ Rotation Test (50*) L    X   R    X  Passive Lumbar Lock (IR) Extension/ Rotation Test (50*) L   X - increased LBP  R   X - increased LBP   Active Prone on Elbow Extension/Rotation Test (30*) L       R      Passive Prone on Elbow Extension/Rotation Test (30*) L       R      Lower Quarter Flowchart   FN FP DP DN  Active Prone Hip ER Test (40*) L 49*      R 53*     Active Prone Hip IR Test (30*) L    16*   R    11*  Stabilized Prone Hip ER Test (40*) L       R      Stabilized Prone Hip IR Test (30*) L     26*   R    12*  Passive Prone Hip ER Test (40*) L       R      Passive Prone Hip IR Test (30*) L    23*   R    23*  Active Seated Tibial ER Test (20*) L X      R X     Active Seated Tibial IR Test (20*) L X      R X     Passive Tibial ER Test (20*)  L       R      Passive Tibial IR Test (20*) L       R      - Tibial mobility WNL, bil hip IR MD  Single-Leg Stance: Vestibular & Core   FN FP DP DN  Vestibular Test - CTSIB (Static Head on foam) (EO > EC) X     CTSIB (Dynamic Head Movement on foam) (EO > EC) X - EO and EC     Half-kneeling Narrow Base Test LLE down    X    RLE down    X  Quadruped Diagonals Test L    X   R    X  Ankle Flowchart  Active Tandem DF - Knee Extended Test  L    X   R    X  Passive Prone DF - Knee Extended Test (20*) L    X - neutral   R    -5*  Active Tandem PF Test (40*) L    X   R    X  Passive Prone PF Test (40*) L 45*      R 45*     Active Seated Ankle Inversion Test  L    X   R    X  Passive Ankle Inversion Test  L    X   R    X  Active Ankle Eversion Test L    X   R    X  Passive Ankle Eversion Test L    X   R    X  - Ankle MD for DF bilaterally with SMCD for PF bilaterally   Arms Down Deep Squat:  FN FP DP DN  Active Tandem DF - Knee Flexed Test (40*) L    X   R    X  Passive Prone DF - Knee Flexed Test (30*) L    +5*   R    8*  Active Seated Ankle Inversion/Eversion Test  L    X - appeared more difficult and patient reported more stiff   R    X  Passive Seated Ankle Inversion/Eversion Test L    X   R  X  Supine Knees to Chest Holding Shins Test (120*)     X  Supine Knees to Chest Holding Thighs Test (120*)    X  Active Seated Hip IR Test (30*) L    24*    R    18*  Active Seated Hip ER Test (40*) L    14*   R    15*  Passive Seated Hip IR Test (30*) L    9*   R    17*  Passive Seated Hip ER Test (40*) L    26*   R    17*  - Ankle MD for DF with knee flexed, bil hip MD for IR and ER, plus bil ankle inv/ev MD     [facility] Adult PT Treatment/Exercise - 07/02/17 0001      Lumbar Exercises: Aerobic   Stationary Bike  NUSTEP x5 minutes for AA/ROM, L4 hip flexion and trunk rotation      Manual Therapy   Manual Therapy  Joint mobilization;Soft tissue mobilization    Manual  therapy comments  completed separate rest of treatment    Joint Mobilization  Grade II-III C2-T12 CPAs in sidelying x10" bouts each, most tender at distal T spine; Grade II-III CPAs to T5-8 after STM and pt with much improved tenderness, T6 was the most tender to palpation but with prolonged bout, the tenderness decreased    Soft tissue mobilization  in sidelying, IASTM with orange massage ball to bil mid-lower thoracic paraspinals; manual UT stretch 2x30" bil             PT Education - 07/02/17 1124    Education provided  Yes    Education Details  will address mobility deficits identified in SFMA breakouts    Person(s) Educated  Patient    Methods  Explanation    Comprehension  Verbalized understanding       PT Short Term Goals - 06/17/17 1325      PT SHORT TERM GOAL #1   Title  Pt will be independent with HEP and perform consistently to maximize overall function and decrease pain.     Baseline  12/74: "some;" tries to do them everyday    Time  3    Period  Weeks    Status  On-going      PT SHORT TERM GOAL #2   Title  Pt will have 5/5 MMT in bil hip abd to maximize gait and balance.     Baseline  12/4: L hip abd 4+/5    Time  3    Period  Weeks    Status  Partially Met      PT SHORT TERM GOAL #3   Title  Pt will be modified independent with log rolling to decrease his LBP and maximize his bed mobility.    Time  3    Period  Weeks    Status  Achieved      PT SHORT TERM GOAL #4   Title  Pt wll report no LBP during cervical extension, no L hip pain during L cervical rotation, and no R hip pain during right multi-segmental rotation on the SFMA Top Tier to demonstrate improved mobility, decrease pain, and improved overall function    Time  4    Period  Weeks    Status  New    Target Date  07/15/17        PT Long Term Goals - 06/17/17 1326      PT  LONG TERM GOAL #1   Title  Pt will be able to perform 5xSTS in 18 sec or < to demo improved functional BLE strength.      Baseline  11/13: 17 sec    Time  6    Period  Weeks    Status  Achieved      PT LONG TERM GOAL #2   Title  Pt will be able to perform SLS on BLE for 10 sec with no UE support in order to maximize gait and decrease risk for falls.     Baseline  12/4: 3 sec or < BLE    Time  6    Period  Weeks    Status  On-going      PT LONG TERM GOAL #3   Title  Pt will score a 20/24 on the DGI to demo improved balance and decrease risk for falls.    Baseline  12/4: 22/24    Time  6    Period  Weeks    Status  Achieved      PT LONG TERM GOAL #4   Title  Pt will have improved 3MWT by 183f or > to demo improved overall tolerance to movement and maximize community access.     Baseline  12/4: 5850f(at initial eval was [id number] reass was [id number]  Time  6    Period  Weeks    Status  On-going      PT LONG TERM GOAL #5   Title  Pt will have improved lumbar flexion AROM by at least 25% to decrease pain and maximize pt's ability to put on his socks and shoes with less pain.     Baseline  12/4: pt still 50% limited in lumbar flexion AROM and he is still unable to get his socks and shoes    Time  6    Period  Weeks    Status  On-going            Plan - 07/02/17 1202    Clinical Impression Statement  Completed SFMA breakouts this date (see detailed findings above). Pt with significant mobility deficits of cervical, thoracic, lumbar spine, bil hips and ankles. Per SFMA progression, began by addressing cervical and thoracic mobility deficits. Pt very hypomobile with central PAs to cervical and thoracic spine and had increased soft tissue restrictions throughout, with pt being tender along mid-distal thoracic paraspinals. Pt responded very well to manual therapy and reported min to no pain with palpation afterwards. He stated his neck and mid back felt great at EOS. Pt not returning until 07/16/17; his POC ends on 07/15/17 and he still has approved visits so PT will extend pt's time frame today cover  approved visits.    Rehab Potential  Fair    PT Frequency  2x / week    PT Duration  6 weeks    PT Next Visit Plan  continue to address [facility]    PT Home Exercise Plan  eval: HS stretch, STS; 05/08/2017 - LTR, SKTC, DKTC, pelvic tilts; DKTC 2x30sec, DKTC 20-25x AROM repeated BID; 11/13: seated lumbar flexion stretch; 11/19: LTRs; 11/20: walking program; 11/26: bridging, 3D thoracic excur with UE movements, daily walking; 11/27: supine piriformis stretch    Consulted and Agree with Plan of Care  Patient       Patient will benefit from skilled therapeutic intervention in order to improve the following deficits and impairments:  Abnormal gait,  Decreased activity tolerance, Decreased balance, Decreased endurance, Decreased mobility, Decreased range of motion, Decreased strength, Difficulty walking, Hypomobility, Increased fascial restricitons, Increased muscle spasms, Impaired flexibility, Improper body mechanics, Postural dysfunction, Pain, Obesity  Visit Diagnosis: Chronic midline low back pain without sciatica - Plan: PT plan of care cert/re-cert  Difficulty in walking, not elsewhere classified - Plan: PT plan of care cert/re-cert  Other symptoms and signs involving the musculoskeletal system - Plan: PT plan of care cert/re-cert  Muscle weakness (generalized) - Plan: PT plan of care cert/re-cert     Problem List Patient Active Problem List   Diagnosis Date Noted  . Chronic anticoagulation 05/05/2017  . Sleep apnea 05/05/2017  . Edema leg 05/05/2017  . Dysphasia 02/07/2017  . Mild obesity 05/10/2016  . History of colonic polyps   . Diverticulosis of colon without hemorrhage   . Hyperlipidemia LDL goal <130 05/16/2013  . KNEE PAIN 08/07/2009  . KNEE SPRAIN, ACUTE 08/07/2009  . COLONIC POLYPS, HX OF 05/10/2009  . HYPOTHYROIDISM 05/08/2009  . History of bipolar disorder 05/08/2009  . Essential hypertension 05/08/2009  . Permanent atrial  fibrillation ([place]) 05/08/2009  . HEMATOCHEZIA 05/08/2009  . LOW BACK PAIN, CHRONIC 05/08/2009  . ABDOMINAL PAIN 05/08/2009      Geraldine Solar PT, DPT  [place] [address] [place], [place], [address] Phone: [phone]   Fax:  [phone]  Name: JEANCARLOS MARCHENA MRN: [id number] Date of Birth: 03/03/1946

## 2017-07-16 ENCOUNTER — Encounter (HOSPITAL_COMMUNITY): Payer: Self-pay

## 2017-07-16 ENCOUNTER — Ambulatory Visit (HOSPITAL_COMMUNITY): Payer: Medicare Other | Attending: Neurosurgery

## 2017-07-16 DIAGNOSIS — G8929 Other chronic pain: Secondary | ICD-10-CM | POA: Insufficient documentation

## 2017-07-16 DIAGNOSIS — R29898 Other symptoms and signs involving the musculoskeletal system: Secondary | ICD-10-CM | POA: Insufficient documentation

## 2017-07-16 DIAGNOSIS — M6281 Muscle weakness (generalized): Secondary | ICD-10-CM | POA: Diagnosis not present

## 2017-07-16 DIAGNOSIS — R262 Difficulty in walking, not elsewhere classified: Secondary | ICD-10-CM | POA: Insufficient documentation

## 2017-07-16 DIAGNOSIS — M545 Low back pain, unspecified: Secondary | ICD-10-CM

## 2017-07-16 NOTE — Therapy (Signed)
[place], [place], [address] Phone: [phone]   Fax:  [phone]  Physical Therapy Treatment  Patient Details  Name: Casey [name] MRN: [id number] Date of Birth: 1945-10-05 Referring Provider: Glenna Fellows, MD   Encounter Date: 07/16/2017  PT End of Session - 07/16/17 1300    Visit Number  17    Number of Visits  21    Date for PT Re-Evaluation  06/18/17    Authorization Type  Medicare Part A and B (g codes done on visit #13)    Authorization Time Period  06/17/17 to 07/15/16; NEW; 07/15/17 to 07/30/17    Authorization - Visit Number  17    Authorization - Number of Visits  21    PT Start Time  1300    PT Stop Time  1341    PT Time Calculation (min)  41 min    Activity Tolerance  Patient tolerated treatment well;No increased pain    Behavior During Therapy  WFL for tasks assessed/performed       Past Medical History:  Diagnosis Date  . Arthritis   . Atrial fibrillation ([place]) 01/12/2010   2D Echo EF=>55%  . Bipolar disorder ([place])   . Chronic back pain   . COPD (chronic obstructive pulmonary disease) ([place])   . Depression   . Dysrhythmia    AFib  . History of colonic polyps   . History of gout   . Hyperlipidemia   . Hypertension   . Hypothyroidism   . Morbid obesity ([place])   . OSA (obstructive sleep apnea)    AHI was 27.62hr RDI was 34.3 hr REM 0.00hr; had CPAP years ago but no longer uses.  . Prostatitis   . Tubular adenoma of colon 01/2016    Past Surgical History:  Procedure Laterality Date  . APPENDECTOMY  1959  . asthma    . CATARACT EXTRACTION Bilateral 1995  . COLONOSCOPY  2011   RMR: left-sided diverticula, minimal rectal friability. No polyps. Repeat 5 years.  . COLONOSCOPY WITH PROPOFOL N/A 01/15/2016   Dr.Rourk- diverticulosis in the entire examined colon, multiple rectal and colonic polyps, internal hemorrhoids. bx= tubular adenomas and hyperplastic polyps.   Marland Kitchen HAND / FINGER LESION EXCISION  Right   . POLYPECTOMY  01/15/2016   Procedure: POLYPECTOMY;  Surgeon: Daneil Dolin, MD;  [facility];  Service: Endoscopy;;  . right shoulder surgery    . TONSILLECTOMY  1955    There were no vitals filed for this visit.  Subjective Assessment - 07/16/17 1300    Subjective  Pt states that his pain has been better since his last treatment. His neck, lower back, and hip have all been doing well since his last treatment.     Patient Stated Goals  get back in shape, get socks and shoes back on    Currently in Pain?  No/denies          [facility] Adult PT Treatment/Exercise - 07/16/17 0001      Lumbar Exercises: Stretches   Lower Trunk Rotation Limitations  seated upper trap stretch 2x30" each (felt more on R); seated levator scap stretch 2x30" each    Pelvic Tilt Limitations  slant board stretch 2x30"    Quad Stretch Limitations  seate lumbar flexion stretch 2x15"      Lumbar Exercises: Standing   Other Standing Lumbar Exercises  hip IR around step 5x10" holds each      Lumbar Exercises:  Seated   Other Seated Lumbar Exercises  3D thoracic excursions with UE movements x10 reps each      Manual Therapy   Manual Therapy  Joint mobilization    Manual therapy comments  completed separate rest of treatment    Joint Mobilization  Grade II-III central PAs joint mobs T5-L1, increased tenderness distally which improved with prolonged bout            PT Education - 07/16/17 1321    Education provided  Yes    Education Details  updated HEP, exercise technique    Person(s) Educated  Patient    Methods  Explanation;Demonstration;Handout    Comprehension  Verbalized understanding;Returned demonstration       PT Short Term Goals - 06/17/17 1325      PT SHORT TERM GOAL #1   Title  Pt will be independent with HEP and perform consistently to maximize overall function and decrease pain.     Baseline  12/74: "some;" tries to do them everyday    Time  3    Period  Weeks     Status  On-going      PT SHORT TERM GOAL #2   Title  Pt will have 5/5 MMT in bil hip abd to maximize gait and balance.     Baseline  12/4: L hip abd 4+/5    Time  3    Period  Weeks    Status  Partially Met      PT SHORT TERM GOAL #3   Title  Pt will be modified independent with log rolling to decrease his LBP and maximize his bed mobility.    Time  3    Period  Weeks    Status  Achieved      PT SHORT TERM GOAL #4   Title  Pt wll report no LBP during cervical extension, no L hip pain during L cervical rotation, and no R hip pain during right multi-segmental rotation on the SFMA Top Tier to demonstrate improved mobility, decrease pain, and improved overall function    Time  4    Period  Weeks    Status  New    Target Date  07/15/17        PT Long Term Goals - 06/17/17 1326      PT LONG TERM GOAL #1   Title  Pt will be able to perform 5xSTS in 18 sec or < to demo improved functional BLE strength.     Baseline  11/13: 17 sec    Time  6    Period  Weeks    Status  Achieved      PT LONG TERM GOAL #2   Title  Pt will be able to perform SLS on BLE for 10 sec with no UE support in order to maximize gait and decrease risk for falls.     Baseline  12/4: 3 sec or < BLE    Time  6    Period  Weeks    Status  On-going      PT LONG TERM GOAL #3   Title  Pt will score a 20/24 on the DGI to demo improved balance and decrease risk for falls.    Baseline  12/4: 22/24    Time  6    Period  Weeks    Status  Achieved      PT LONG TERM GOAL #4   Title  Pt will have improved 3MWT by 15f or >  to demo improved overall tolerance to movement and maximize community access.     Baseline  12/4: 517f (at initial eval was 6250f last reass was 50839f   Time  6    Period  Weeks    Status  On-going      PT LONG TERM GOAL #5   Title  Pt will have improved lumbar flexion AROM by at least 25% to decrease pain and maximize pt's ability to put on his socks and shoes with less pain.     Baseline   12/4: pt still 50% limited in lumbar flexion AROM and he is still unable to get his socks and shoes    Time  6    Period  Weeks    Status  On-going            Plan - 07/16/17 1342    Clinical Impression Statement  Continued with established POC address mobility deficits of cervical and thoracic spine as well as of bil hips and ankles. Pt denied any pain during session and reported feeling much looser at EOS. Updated HEP to include cervical stretches and thoracic mobility. Continue as planned.    Rehab Potential  Fair    PT Frequency  2x / week    PT Duration  6 weeks    PT Next Visit Plan  continue to address mobility deficits identified in SFMA breakouts; add hip IR mobs, continue STM next visit    PT Home Exercise Plan  eval: HS stretch, STS; 05/08/2017 - LTR, SKTC, DKTC, pelvic tilts; DKTC 2x30sec, DKTC 20-25x AROM repeated BID; 11/13: seated lumbar flexion stretch; 11/19: LTRs; 11/20: walking program; 11/26: bridging, daily walking; 11/27: supine piriformis stretch; 1/2:  3D thoracic excur with UE movements, UT and LS stretches    Consulted and Agree with Plan of Care  Patient       Patient will benefit from skilled therapeutic intervention in order to improve the following deficits and impairments:  Abnormal gait, Decreased activity tolerance, Decreased balance, Decreased endurance, Decreased mobility, Decreased range of motion, Decreased strength, Difficulty walking, Hypomobility, Increased fascial restricitons, Increased muscle spasms, Impaired flexibility, Improper body mechanics, Postural dysfunction, Pain, Obesity  Visit Diagnosis: Chronic midline low back pain without sciatica  Difficulty in walking, not elsewhere classified  Other symptoms and signs involving the musculoskeletal system  Muscle weakness (generalized)     Problem List Patient Active Problem List   Diagnosis Date Noted  . Chronic anticoagulation 05/05/2017  . Sleep apnea 05/05/2017  . Edema leg  05/05/2017  . Dysphasia 02/07/2017  . Mild obesity 05/10/2016  . History of colonic polyps   . Diverticulosis of colon without hemorrhage   . Hyperlipidemia LDL goal <130 05/16/2013  . KNEE PAIN 08/07/2009  . KNEE SPRAIN, ACUTE 08/07/2009  . COLONIC POLYPS, HX OF 05/10/2009  . HYPOTHYROIDISM 05/08/2009  . History of bipolar disorder 05/08/2009  . Essential hypertension 05/08/2009  . P[place]  . HEMATOCHEZIA 05/08/2009  . LOW BACK PAIN, CHRONIC 05/08/2009  . ABDOMINAL PAIN 05/08/2009       BroGeraldine Solar, DPT  [place] [place]one: [phone]:  [phone]: EarSIAH STEELYN: [phone] of Birth: 2/2Dec 02, 1947

## 2017-07-16 NOTE — Patient Instructions (Addendum)
  UPPER TRAP STRETCH - HOLDING CHAIR  While sitting in a chair, hold the seat with one hand and bend your head towards the opposite side for a gentle stretch to the side of the neck.  Perform 1-2x/day, 3-5 stretches holding for 30-60 seconds each    Levator scap stretch   Begin by sitting straight up, then turn your head to one side and then down towards your armpit. A stretch should be felt in the back of neck down towards the shoulder blades.   Perform 1-2x/day, 3-5 stretches holding for 30-60 seconds each   3D thoracic excursions with arm movements 1x/day, 10-15 reps each direction (see other handout)

## 2017-07-17 DIAGNOSIS — F319 Bipolar disorder, unspecified: Secondary | ICD-10-CM | POA: Diagnosis not present

## 2017-07-18 ENCOUNTER — Encounter (HOSPITAL_COMMUNITY): Payer: Self-pay

## 2017-07-18 ENCOUNTER — Ambulatory Visit (HOSPITAL_COMMUNITY): Payer: Medicare Other

## 2017-07-18 DIAGNOSIS — R29898 Other symptoms and signs involving the musculoskeletal system: Secondary | ICD-10-CM

## 2017-07-18 DIAGNOSIS — R262 Difficulty in walking, not elsewhere classified: Secondary | ICD-10-CM | POA: Diagnosis not present

## 2017-07-18 DIAGNOSIS — M545 Low back pain: Secondary | ICD-10-CM | POA: Diagnosis not present

## 2017-07-18 DIAGNOSIS — G8929 Other chronic pain: Secondary | ICD-10-CM | POA: Diagnosis not present

## 2017-07-18 DIAGNOSIS — M6281 Muscle weakness (generalized): Secondary | ICD-10-CM | POA: Diagnosis not present

## 2017-07-18 NOTE — Therapy (Signed)
[facility] [place], [place], [address] Phone: [phone]   Fax:  [phone]  Physical Therapy Treatment  Patient Details  Name: LOCHLAN GRYGIEL MRN: [id number] Date of Birth: 1945/12/09 Referring Provider: Glenna Fellows, MD   Encounter Date: 07/18/2017  PT End of Session - 07/18/17 0821    Visit Number  18    Number of Visits  21    Date for PT Re-Evaluation  06/18/17    Authorization Type  Medicare Part A and B (g codes done on visit #13)    Authorization Time Period  06/17/17 to 07/15/16; NEW; 07/15/17 to 07/30/17    Authorization - Visit Number  18    Authorization - Number of Visits  21    PT Start Time  0818    PT Stop Time  4818    PT Time Calculation (min)  39 min    Activity Tolerance  Patient tolerated treatment well;No increased pain    Behavior During Therapy  WFL for tasks assessed/performed       Past Medical History:  Diagnosis Date  . Arthritis   . Atrial fibrillation ([place]) 01/12/2010   2D Echo EF=>55%  . Bipolar disorder ([place])   . Chronic back pain   . COPD (chronic obstructive pulmonary disease) ([place])   . Depression   . Dysrhythmia    AFib  . History of colonic polyps   . History of gout   . Hyperlipidemia   . Hypertension   . Hypothyroidism   . Morbid obesity ([place])   . OSA (obstructive sleep apnea)    AHI was 27.62hr RDI was 34.3 hr REM 0.00hr; had CPAP years ago but no longer uses.  . Prostatitis   . Tubular adenoma of colon 01/2016    Past Surgical History:  Procedure Laterality Date  . APPENDECTOMY  1959  . asthma    . CATARACT EXTRACTION Bilateral 1995  . COLONOSCOPY  2011   RMR: left-sided diverticula, minimal rectal friability. No polyps. Repeat 5 years.  . COLONOSCOPY WITH PROPOFOL N/A 01/15/2016   Dr.Rourk- diverticulosis in the entire examined colon, multiple rectal and colonic polyps, internal hemorrhoids. bx= tubular adenomas and hyperplastic polyps.   Marland Kitchen HAND / FINGER LESION EXCISION  Right   . POLYPECTOMY  01/15/2016   Procedure: POLYPECTOMY;  Surgeon: Daneil Dolin, MD;  [facility];  Service: Endoscopy;;  . right shoulder surgery    . TONSILLECTOMY  1955    There were no vitals filed for this visit.  Subjective Assessment - 07/18/17 0820    Subjective  Pt states that his back pain is about 5/10 achey this morning. He states that the weather and the early appointment didn't help.     Patient Stated Goals  get back in shape, get socks and shoes back on    Currently in Pain?  Yes    Pain Score  5     Pain Location  Back    Pain Orientation  Lower    Pain Descriptors / Indicators  Aching;Dull    Pain Type  Chronic pain    Pain Onset  More than a month ago    Pain Frequency  Intermittent    Aggravating Factors   walking, sitting too long    Pain Relieving Factors  moving    Effect of Pain on Daily Activities  increases             [facility] Adult PT Treatment/Exercise -  07/18/17 0001      Lumbar Exercises: Stretches   Pelvic Tilt Limitations  calf stretch on step 3x30"      Lumbar Exercises: Standing   Other Standing Lumbar Exercises  3D hip excursions x10 reps each      Manual Therapy   Manual Therapy  Joint mobilization;Soft tissue mobilization;Manual Traction    Manual therapy comments  completed separate rest of treatment    Joint Mobilization  Grade III hip IR stretching,     Soft tissue mobilization  in sidelying, IASTM with orange massage ball to bil thoracic and lumbar paraspinals; manual UT stretch 3x15-20" bil; bil manual OA rotation stretch 3x15-20" each    Manual Traction  bil hip distraction with belt           PT Education - 07/18/17 0821    Education provided  Yes    Education Details  exercise technique, added standing calf stretch to HEP    Person(s) Educated  Patient    Methods  Explanation;Demonstration;Handout    Comprehension  Returned demonstration       PT Short Term Goals - 06/17/17 1325      PT SHORT TERM  GOAL #1   Title  Pt will be independent with HEP and perform consistently to maximize overall function and decrease pain.     Baseline  12/74: "some;" tries to do them everyday    Time  3    Period  Weeks    Status  On-going      PT SHORT TERM GOAL #2   Title  Pt will have 5/5 MMT in bil hip abd to maximize gait and balance.     Baseline  12/4: L hip abd 4+/5    Time  3    Period  Weeks    Status  Partially Met      PT SHORT TERM GOAL #3   Title  Pt will be modified independent with log rolling to decrease his LBP and maximize his bed mobility.    Time  3    Period  Weeks    Status  Achieved      PT SHORT TERM GOAL #4   Title  Pt wll report no LBP during cervical extension, no L hip pain during L cervical rotation, and no R hip pain during right multi-segmental rotation on the SFMA Top Tier to demonstrate improved mobility, decrease pain, and improved overall function    Time  4    Period  Weeks    Status  New    Target Date  07/15/17        PT Long Term Goals - 06/17/17 1326      PT LONG TERM GOAL #1   Title  Pt will be able to perform 5xSTS in 18 sec or < to demo improved functional BLE strength.     Baseline  11/13: 17 sec    Time  6    Period  Weeks    Status  Achieved      PT LONG TERM GOAL #2   Title  Pt will be able to perform SLS on BLE for 10 sec with no UE support in order to maximize gait and decrease risk for falls.     Baseline  12/4: 3 sec or < BLE    Time  6    Period  Weeks    Status  On-going      PT LONG TERM GOAL #3   Title  Pt will score a 20/24 on the DGI to demo improved balance and decrease risk for falls.    Baseline  12/4: 22/24    Time  6    Period  Weeks    Status  Achieved      PT LONG TERM GOAL #4   Title  Pt will have improved 3MWT by 142f or > to demo improved overall tolerance to movement and maximize community access.     Baseline  12/4: 5824f(at initial eval was [id number] reass was [id number]  Time  6    Period  Weeks     Status  On-going      PT LONG TERM GOAL #5   Title  Pt will have improved lumbar flexion AROM by at least 25% to decrease pain and maximize pt's ability to put on his socks and shoes with less pain.     Baseline  12/4: pt still 50% limited in lumbar flexion AROM and he is still unable to get his socks and shoes    Time  6    Period  Weeks    Status  On-going            Plan - 07/18/17 [id number] Impression Statement  Continued with focus on pt's mobility deficits of cervical and thoracic spine as well as hips and ankles. Pt continues with general hypomobility throughout spine and hips. Pt responding well to manual therapy this date stating he has 0/10 LBP at EOS.     Rehab Potential  Fair    PT Frequency  2x / week    PT Duration  6 weeks    PT Next Visit Plan  continue to address mobility deficits identified in SFMA breakouts; add hip IR mobs, continue with STM next visit    PT Home Exercise Plan  eval: HS stretch, STS; 05/08/2017 - LTR, SKTC, DKTC, pelvic tilts; DKTC 2x30sec, DKTC 20-25x AROM repeated BID; 11/13: seated lumbar flexion stretch; 11/19: LTRs; 11/20: walking program; 11/26: bridging, daily walking; 11/27: supine piriformis stretch; 1/2:  3D thoracic excur with UE movements, UT and LS stretches    Consulted and Agree with Plan of Care  Patient       Patient will benefit from skilled therapeutic intervention in order to improve the following deficits and impairments:  Abnormal gait, Decreased activity tolerance, Decreased balance, Decreased endurance, Decreased mobility, Decreased range of motion, Decreased strength, Difficulty walking, Hypomobility, Increased fascial restricitons, Increased muscle spasms, Impaired flexibility, Improper body mechanics, Postural dysfunction, Pain, Obesity  Visit Diagnosis: Chronic midline low back pain without sciatica  Difficulty in walking, not elsewhere classified  Other symptoms and signs involving the musculoskeletal  system  Muscle weakness (generalized)     Problem List Patient Active Problem List   Diagnosis Date Noted  . Chronic anticoagulation 05/05/2017  . Sleep apnea 05/05/2017  . Edema leg 05/05/2017  . Dysphasia 02/07/2017  . Mild obesity 05/10/2016  . History of colonic polyps   . Diverticulosis of colon without hemorrhage   . Hyperlipidemia LDL goal <130 05/16/2013  . KNEE PAIN 08/07/2009  . KNEE SPRAIN, ACUTE 08/07/2009  . COLONIC POLYPS, HX OF 05/10/2009  . HYPOTHYROIDISM 05/08/2009  . History of bipolar disorder 05/08/2009  . Essential hypertension 05/08/2009  . Permanent atrial fibrillation (HCC)[place]/25/2010  . HEMATOCHEZIA 05/08/2009  . LOW BACK PAIN, CHRONIC 05/08/2009  . ABDOMINAL PAIN 05/08/2009       BrooGeraldine Solar DPT  [facility]  [place], [place], [address] Phone: [phone]   Fax:  [phone]  Name: EVERADO PILLSBURY MRN: [id number] Date of Birth: 12-13-1945

## 2017-07-22 ENCOUNTER — Ambulatory Visit (HOSPITAL_COMMUNITY): Payer: Medicare Other

## 2017-07-22 ENCOUNTER — Encounter (HOSPITAL_COMMUNITY): Payer: Self-pay

## 2017-07-22 DIAGNOSIS — R262 Difficulty in walking, not elsewhere classified: Secondary | ICD-10-CM

## 2017-07-22 DIAGNOSIS — G8929 Other chronic pain: Secondary | ICD-10-CM

## 2017-07-22 DIAGNOSIS — M6281 Muscle weakness (generalized): Secondary | ICD-10-CM | POA: Diagnosis not present

## 2017-07-22 DIAGNOSIS — M545 Low back pain, unspecified: Secondary | ICD-10-CM

## 2017-07-22 DIAGNOSIS — R29898 Other symptoms and signs involving the musculoskeletal system: Secondary | ICD-10-CM

## 2017-07-22 NOTE — Therapy (Signed)
[facility] [place], [place], [address] Phone: [phone]   Fax:  [phone]  Physical Therapy Treatment  Patient Details  Name: QUANTRELL SPLITT MRN: [id number] Date of Birth: Mar 30, 1946 Referring Provider: Glenna Fellows, MD   Encounter Date: 07/22/2017  PT End of Session - 07/22/17 1122    Visit Number  19    Number of Visits  21    Date for PT Re-Evaluation  06/18/17    Authorization Type  Medicare Part A and B (g codes done on visit #13)    Authorization Time Period  06/17/17 to 07/15/16; NEW; 07/15/17 to 07/30/17    Authorization - Visit Number  19    Authorization - Number of Visits  21    PT Start Time  3903    PT Stop Time  1143    PT Time Calculation (min)  26 min    Activity Tolerance  Patient tolerated treatment well;No increased pain    Behavior During Therapy  WFL for tasks assessed/performed       Past Medical History:  Diagnosis Date  . Arthritis   . Atrial fibrillation ([place]) 01/12/2010   2D Echo EF=>55%  . Bipolar disorder ([place])   . Chronic back pain   . COPD (chronic obstructive pulmonary disease) ([place])   . Depression   . Dysrhythmia    AFib  . History of colonic polyps   . History of gout   . Hyperlipidemia   . Hypertension   . Hypothyroidism   . Morbid obesity ([place])   . OSA (obstructive sleep apnea)    AHI was 27.62hr RDI was 34.3 hr REM 0.00hr; had CPAP years ago but no longer uses.  . Prostatitis   . Tubular adenoma of colon 01/2016    Past Surgical History:  Procedure Laterality Date  . APPENDECTOMY  1959  . asthma    . CATARACT EXTRACTION Bilateral 1995  . COLONOSCOPY  2011   RMR: left-sided diverticula, minimal rectal friability. No polyps. Repeat 5 years.  . COLONOSCOPY WITH PROPOFOL N/A 01/15/2016   Dr.Rourk- diverticulosis in the entire examined colon, multiple rectal and colonic polyps, internal hemorrhoids. bx= tubular adenomas and hyperplastic polyps.   Marland Kitchen HAND / FINGER LESION EXCISION  Right   . POLYPECTOMY  01/15/2016   Procedure: POLYPECTOMY;  Surgeon: Daneil Dolin, MD;  [facility];  Service: Endoscopy;;  . right shoulder surgery    . TONSILLECTOMY  1955    There were no vitals filed for this visit.  Subjective Assessment - 07/22/17 1122    Subjective  Pt states he had to lay around all weekend due to increased LBP. He states it feels like he pulled a muscle. He noticed it Friday night and is unsure of what caused it.     Patient Stated Goals  get back in shape, get socks and shoes back on    Currently in Pain?  Yes    Pain Score  5     Pain Location  Back    Pain Orientation  Lower    Pain Descriptors / Indicators  Spasm    Pain Type  Chronic pain    Pain Onset  More than a month ago    Pain Frequency  Intermittent    Aggravating Factors   walking, sitting too long    Pain Relieving Factors  moving    Effect of Pain on Daily Activities  increases  [facility] Adult PT Treatment/Exercise - 07/22/17 0001      Lumbar Exercises: Stretches   Single Knee to Chest Stretch  3 reps;20 seconds    Single Knee to Chest Stretch Limitations  manual L SKTC    Pelvic Tilt Limitations  calf stretch on step 3x30"    Quad Stretch Limitations  seated lumbar flexion stretch (elbows on thighs) 3x10"      Manual Therapy   Manual Therapy  Soft tissue mobilization    Manual therapy comments  completed separate rest of treatment    Soft tissue mobilization  light STM to L lumbar distal paraspinals x15 mins           PT Short Term Goals - 06/17/17 1325      PT SHORT TERM GOAL #1   Title  Pt will be independent with HEP and perform consistently to maximize overall function and decrease pain.     Baseline  12/74: "some;" tries to do them everyday    Time  3    Period  Weeks    Status  On-going      PT SHORT TERM GOAL #2   Title  Pt will have 5/5 MMT in bil hip abd to maximize gait and balance.     Baseline  12/4: L hip abd 4+/5    Time  3     Period  Weeks    Status  Partially Met      PT SHORT TERM GOAL #3   Title  Pt will be modified independent with log rolling to decrease his LBP and maximize his bed mobility.    Time  3    Period  Weeks    Status  Achieved      PT SHORT TERM GOAL #4   Title  Pt wll report no LBP during cervical extension, no L hip pain during L cervical rotation, and no R hip pain during right multi-segmental rotation on the SFMA Top Tier to demonstrate improved mobility, decrease pain, and improved overall function    Time  4    Period  Weeks    Status  New    Target Date  07/15/17        PT Long Term Goals - 06/17/17 1326      PT LONG TERM GOAL #1   Title  Pt will be able to perform 5xSTS in 18 sec or < to demo improved functional BLE strength.     Baseline  11/13: 17 sec    Time  6    Period  Weeks    Status  Achieved      PT LONG TERM GOAL #2   Title  Pt will be able to perform SLS on BLE for 10 sec with no UE support in order to maximize gait and decrease risk for falls.     Baseline  12/4: 3 sec or < BLE    Time  6    Period  Weeks    Status  On-going      PT LONG TERM GOAL #3   Title  Pt will score a 20/24 on the DGI to demo improved balance and decrease risk for falls.    Baseline  12/4: 22/24    Time  6    Period  Weeks    Status  Achieved      PT LONG TERM GOAL #4   Title  Pt will have improved 3MWT by 140f or > to demo improved overall  tolerance to movement and maximize community access.     Baseline  12/4: 590f (at initial eval was 6231f last reass was 50875f   Time  6    Period  Weeks    Status  On-going      PT LONG TERM GOAL #5   Title  Pt will have improved lumbar flexion AROM by at least 25% to decrease pain and maximize pt's ability to put on his socks and shoes with less pain.     Baseline  12/4: pt still 50% limited in lumbar flexion AROM and he is still unable to get his socks and shoes    Time  6    Period  Weeks    Status  On-going             Plan - 07/22/17 1153    Clinical Impression Statement  Pt presents with increased LBP this date, stating he thinks he pulled a muscle; he's unsure how it happened. Pt in visible discomfort during session. Performed light manual therapy to area of discomfort, which he pinpointed at L distal lumbar paraspinals. Pt stated that heat makes it feel better the most. This clinics hydroculator is currently out of order so unable to apply heat to pt for comfort this date. Educated pt to go home and rest on some heat but to also stay moving as best he could. Reminded him to perform LTRs when resting and to walk as comfortably as he could. He verbalized understanding. Ended session early this date due to increased pain and discomfort as to not exacerbate pt's complaints.     Rehab Potential  Fair    PT Frequency  2x / week    PT Duration  6 weeks    PT Next Visit Plan  continue to address mobility deficits identified in SFMA breakouts; add hip IR mobs, continue with STM next visit    PT Home Exercise Plan  eval: HS stretch, STS; 05/08/2017 - LTR, SKTC, DKTC, pelvic tilts; DKTC 2x30sec, DKTC 20-25x AROM repeated BID; 11/13: seated lumbar flexion stretch; 11/19: LTRs; 11/20: walking program; 11/26: bridging, daily walking; 11/27: supine piriformis stretch; 1/2:  3D thoracic excur with UE movements, UT and LS stretches    Consulted and Agree with Plan of Care  Patient       Patient will benefit from skilled therapeutic intervention in order to improve the following deficits and impairments:  Abnormal gait, Decreased activity tolerance, Decreased balance, Decreased endurance, Decreased mobility, Decreased range of motion, Decreased strength, Difficulty walking, Hypomobility, Increased fascial restricitons, Increased muscle spasms, Impaired flexibility, Improper body mechanics, Postural dysfunction, Pain, Obesity  Visit Diagnosis: Chronic midline low back pain without sciatica  Difficulty in walking,  not elsewhere classified  Other symptoms and signs involving the musculoskeletal system  Muscle weakness (generalized)     Problem List Patient Active Problem List   Diagnosis Date Noted  . Chronic anticoagulation 05/05/2017  . Sleep apnea 05/05/2017  . Edema leg 05/05/2017  . Dysphasia 02/07/2017  . Mild obesity 05/10/2016  . History of colonic polyps   . Diverticulosis of colon without hemorrhage   . Hyperlipidemia LDL goal <130 05/16/2013  . KNEE PAIN 08/07/2009  . KNEE SPRAIN, ACUTE 08/07/2009  . COLONIC POLYPS, HX OF 05/10/2009  . HYPOTHYROIDISM 05/08/2009  . History of bipolar disorder 05/08/2009  . Essential hypertension 05/08/2009  . Permanent atrial fibrillation ([place]/[place]/2010  . HEMATOCHEZIA 05/08/2009  . LOW BACK PAIN, CHRONIC 05/08/2009  . ABDOMINAL PAIN  05/08/2009       Geraldine Solar PT, [facility] [address] [place], [place], [address] Phone: [phone]   Fax:  [phone]  Name: JENESIS SUCHY MRN: [id number] Date of Birth: 08/29/1945

## 2017-07-24 ENCOUNTER — Encounter (HOSPITAL_COMMUNITY): Payer: Self-pay

## 2017-07-24 ENCOUNTER — Ambulatory Visit (HOSPITAL_COMMUNITY): Payer: Medicare Other

## 2017-07-24 DIAGNOSIS — G8929 Other chronic pain: Secondary | ICD-10-CM | POA: Diagnosis not present

## 2017-07-24 DIAGNOSIS — M545 Low back pain, unspecified: Secondary | ICD-10-CM

## 2017-07-24 DIAGNOSIS — R262 Difficulty in walking, not elsewhere classified: Secondary | ICD-10-CM | POA: Diagnosis not present

## 2017-07-24 DIAGNOSIS — M6281 Muscle weakness (generalized): Secondary | ICD-10-CM

## 2017-07-24 DIAGNOSIS — R29898 Other symptoms and signs involving the musculoskeletal system: Secondary | ICD-10-CM | POA: Diagnosis not present

## 2017-07-24 NOTE — Therapy (Addendum)
[facility] [place], [place], [address] Phone: [phone]   Fax:  [phone]  Physical Therapy Treatment Addendum 07/28/17:  Pt called and asked to be d/c.  PHYSICAL THERAPY DISCHARGE SUMMARY  Visits from Start of Care: 20  Current functional level related to goals / functional outcomes: See below   Remaining deficits: See below   Education / Equipment: n/a Plan: Patient agrees to discharge.  Patient goals were partially met. Patient is being discharged due to the patient's request.  ?????     Geraldine Solar PT, DPT   Patient Details  Name: Casey [name] MRN: [id number] Date of Birth: 10/09/1945 Referring Provider: Glenna Fellows, MD   Encounter Date: 07/24/2017  PT End of Session - 07/24/17 1120    Visit Number  20    Number of Visits  21    Date for PT Re-Evaluation  06/18/17    Authorization Type  Medicare Part A and B (g codes done on visit #13)    Authorization Time Period  06/17/17 to 07/15/16; NEW; 07/15/17 to 07/30/17    Authorization - Visit Number  20    Authorization - Number of Visits  21    PT Start Time  2694    PT Stop Time  8546    PT Time Calculation (min)  42 min    Activity Tolerance  Patient tolerated treatment well;No increased pain    Behavior During Therapy  WFL for tasks assessed/performed       Past Medical History:  Diagnosis Date  . Arthritis   . Atrial fibrillation ([place]) 01/12/2010   2D Echo EF=>55%  . Bipolar disorder ([place])   . Chronic back pain   . COPD (chronic obstructive pulmonary disease) ([place])   . Depression   . Dysrhythmia    AFib  . History of colonic polyps   . History of gout   . Hyperlipidemia   . Hypertension   . Hypothyroidism   . Morbid obesity ([place])   . OSA (obstructive sleep apnea)    AHI was 27.62hr RDI was 34.3 hr REM 0.00hr; had CPAP years ago but no longer uses.  . Prostatitis   . Tubular adenoma of colon 01/2016    Past Surgical History:  Procedure  Laterality Date  . APPENDECTOMY  1959  . asthma    . CATARACT EXTRACTION Bilateral 1995  . COLONOSCOPY  2011   RMR: left-sided diverticula, minimal rectal friability. No polyps. Repeat 5 years.  . COLONOSCOPY WITH PROPOFOL N/A 01/15/2016   Dr.Rourk- diverticulosis in the entire examined colon, multiple rectal and colonic polyps, internal hemorrhoids. bx= tubular adenomas and hyperplastic polyps.   Marland Kitchen HAND / FINGER LESION EXCISION Right   . POLYPECTOMY  01/15/2016   Procedure: POLYPECTOMY;  Surgeon: Daneil Dolin, MD;  [facility];  Service: Endoscopy;;  . right shoulder surgery    . TONSILLECTOMY  1955    There were no vitals filed for this visit.  Subjective Assessment - 07/24/17 1120    Subjective  Pt states that he is doing better today. He started to feel better yesterday and states that he is currently not in pain.    Patient Stated Goals  get back in shape, get socks and shoes back on    Currently in Pain?  No/denies    Pain Onset  More than a month ago            [facility] Adult PT Treatment/Exercise -  07/24/17 0001      Lumbar Exercises: Stretches   Lower Trunk Rotation Limitations  seated upper trap stretch 2x30" each (felt more on R); seated levator scap stretch 2x30" each (reviewed form, min cues); seated cervical rotation stretch with towel 2x30"    Pelvic Tilt Limitations  calf stretch on step 3x30"      Manual Therapy   Manual Therapy  Soft tissue mobilization    Manual therapy comments  completed separate rest of treatment    Soft tissue mobilization  STM to L cervical paraspinals, UT, LS, SCM; OA rotation stretch 3x10" bil, OA rotation MET 5x5" holds each; lower cervical rotation PROM 3x10-15" holds each            PT Education - 07/24/17 1120    Education provided  Yes    Education Details  will reassess next visit    Person(s) Educated  Patient    Methods  Explanation    Comprehension  Verbalized understanding       PT Short Term Goals -  06/17/17 1325      PT SHORT TERM GOAL #1   Title  Pt will be independent with HEP and perform consistently to maximize overall function and decrease pain.     Baseline  12/74: "some;" tries to do them everyday    Time  3    Period  Weeks    Status  On-going      PT SHORT TERM GOAL #2   Title  Pt will have 5/5 MMT in bil hip abd to maximize gait and balance.     Baseline  12/4: L hip abd 4+/5    Time  3    Period  Weeks    Status  Partially Met      PT SHORT TERM GOAL #3   Title  Pt will be modified independent with log rolling to decrease his LBP and maximize his bed mobility.    Time  3    Period  Weeks    Status  Achieved      PT SHORT TERM GOAL #4   Title  Pt wll report no LBP during cervical extension, no L hip pain during L cervical rotation, and no R hip pain during right multi-segmental rotation on the SFMA Top Tier to demonstrate improved mobility, decrease pain, and improved overall function    Time  4    Period  Weeks    Status  New    Target Date  07/15/17        PT Long Term Goals - 06/17/17 1326      PT LONG TERM GOAL #1   Title  Pt will be able to perform 5xSTS in 18 sec or < to demo improved functional BLE strength.     Baseline  11/13: 17 sec    Time  6    Period  Weeks    Status  Achieved      PT LONG TERM GOAL #2   Title  Pt will be able to perform SLS on BLE for 10 sec with no UE support in order to maximize gait and decrease risk for falls.     Baseline  12/4: 3 sec or < BLE    Time  6    Period  Weeks    Status  On-going      PT LONG TERM GOAL #3   Title  Pt will score a 20/24 on the DGI to demo improved balance and  decrease risk for falls.    Baseline  12/4: 22/24    Time  6    Period  Weeks    Status  Achieved      PT LONG TERM GOAL #4   Title  Pt will have improved 3MWT by 123f or > to demo improved overall tolerance to movement and maximize community access.     Baseline  12/4: 5824f(at initial eval was [id number] reass was [id number]   Time  6    Period  Weeks    Status  On-going      PT LONG TERM GOAL #5   Title  Pt will have improved lumbar flexion AROM by at least 25% to decrease pain and maximize pt's ability to put on his socks and shoes with less pain.     Baseline  12/4: pt still 50% limited in lumbar flexion AROM and he is still unable to get his socks and shoes    Time  6    Period  Weeks    Status  On-going            Plan - 07/24/17 1200    Clinical Impression Statement  Pt presents to therapy in no pain this date, stating his LBP stopped hurting yesterday. Pt still reports some neck stiffness so reviewed form with UT and LS stretches; he required min cues for form. Added seated towel rotation stretch for cervical rotation and pt noted to have increased difficulty with looking L. He also reported some L sided LBP with L rotation with the towel. Addressed this by performing STM to L cervical musculature, lower and upper cervical PROM. Pt reported decreased pain with L cervical rotation following and reported very minimal L LBP with L cervical rotation afterwards. Pt due for reassessment next week and will take pt through [facility].    Rehab Potential  Fair    PT Frequency  2x / week    PT Duration  6 weeks    PT Next Visit Plan  reassess, perform SFMA Top Tier again    PT Home Exercise Plan  eval: HS stretch, STS; 05/08/2017 - LTR, SKTC, DKTC, pelvic tilts; DKTC 2x30sec, DKTC 20-25x AROM repeated BID; 11/13: seated lumbar flexion stretch; 11/19: LTRs; 11/20: walking program; 11/26: bridging, daily walking; 11/27: supine piriformis stretch; 1/2:  3D thoracic excur with UE movements, UT and LS stretches; 1/10: seated cervical rotation with towel    Consulted and Agree with Plan of Care  Patient       Patient will benefit from skilled therapeutic intervention in order to improve the following deficits and impairments:  Abnormal gait, Decreased activity tolerance, Decreased balance, Decreased  endurance, Decreased mobility, Decreased range of motion, Decreased strength, Difficulty walking, Hypomobility, Increased fascial restricitons, Increased muscle spasms, Impaired flexibility, Improper body mechanics, Postural dysfunction, Pain, Obesity  Visit Diagnosis: Chronic midline low back pain without sciatica  Difficulty in walking, not elsewhere classified  Other symptoms and signs involving the musculoskeletal system  Muscle weakness (generalized)     Problem List Patient Active Problem List   Diagnosis Date Noted  . Chronic anticoagulation 05/05/2017  . Sleep apnea 05/05/2017  . Edema leg 05/05/2017  . Dysphasia 02/07/2017  . Mild obesity 05/10/2016  . History of colonic polyps   . Diverticulosis of colon without hemorrhage   . Hyperlipidemia LDL goal <130 05/16/2013  . KNEE PAIN 08/07/2009  . KNEE SPRAIN, ACUTE 08/07/2009  . COLONIC POLYPS, HX  OF 05/10/2009  . HYPOTHYROIDISM 05/08/2009  . History of bipolar disorder 05/08/2009  . Essential hypertension 05/08/2009  . Permanent atrial fibrillation ([place]) 05/08/2009  . HEMATOCHEZIA 05/08/2009  . LOW BACK PAIN, CHRONIC 05/08/2009  . ABDOMINAL PAIN 05/08/2009      Geraldine Solar PT, DPT  [facility] [address] [place], [place], [address] Phone: [phone]   Fax:  [phone]  Name: KENTARO ALEWINE MRN: [id number] Date of Birth: Sep 03, 1945

## 2017-07-24 NOTE — Patient Instructions (Signed)
  CERVICAL TOWEL ROTATION STRETCH  Hold the ends of a small folded bath towel and wrap it around your head and neck as shown. Place the towel on your face so as to minimize placing pressure on your jaw. Pressure should be placed on the side of your face/cheek bone.   Use your bottom most arm to anchor the towel in place. Use your top most arm to pull the towel to cause a gentle rotational stretch in your neck. Hold, then return to starting position and repeat.   Perform 1x/day, 2-3 sets of 10-15 reps holding for 5-10 seconds

## 2017-07-28 ENCOUNTER — Telehealth (HOSPITAL_COMMUNITY): Payer: Self-pay | Admitting: Family Medicine

## 2017-07-28 ENCOUNTER — Telehealth (HOSPITAL_COMMUNITY): Payer: Self-pay

## 2017-07-28 ENCOUNTER — Ambulatory Visit (HOSPITAL_COMMUNITY): Payer: Medicare Other

## 2017-07-28 NOTE — Telephone Encounter (Signed)
Pt requested to be Discharge and he will go see his MD soon.

## 2017-07-28 NOTE — Telephone Encounter (Signed)
07/28/17  pt called to say that he woke up sick this morning and wouldn't be here

## 2017-07-29 ENCOUNTER — Encounter (HOSPITAL_COMMUNITY): Payer: Medicare Other

## 2017-07-30 ENCOUNTER — Ambulatory Visit (HOSPITAL_COMMUNITY): Payer: Medicare Other

## 2017-08-05 ENCOUNTER — Encounter: Payer: Self-pay | Admitting: Internal Medicine

## 2017-08-05 ENCOUNTER — Ambulatory Visit (INDEPENDENT_AMBULATORY_CARE_PROVIDER_SITE_OTHER): Payer: Medicare Other | Admitting: Internal Medicine

## 2017-08-05 VITALS — BP 133/85 | HR 66 | Temp 97.6°F | Ht 71.0 in | Wt 232.4 lb

## 2017-08-05 DIAGNOSIS — M47816 Spondylosis without myelopathy or radiculopathy, lumbar region: Secondary | ICD-10-CM | POA: Diagnosis not present

## 2017-08-05 DIAGNOSIS — K219 Gastro-esophageal reflux disease without esophagitis: Secondary | ICD-10-CM

## 2017-08-05 DIAGNOSIS — K921 Melena: Secondary | ICD-10-CM

## 2017-08-05 NOTE — Patient Instructions (Signed)
Continue Protonix 40 mg daily  GERD information provided  Repeat colonoscopy summer of 2022  Office visit in 1 year

## 2017-08-05 NOTE — Progress Notes (Signed)
Primary Care Physician:  Sharilyn Sites, MD Primary Gastroenterologist:  Dr. Gala Romney  Pre-Procedure History & Physical: HPI:  Casey [name] is a 72 y.o. male here for here for follow-up of GERD. History of rectal polyp bleeding -resolved. Last colonoscopy July 2017 Multiple polyps removed; due for surveillance examination 2022. He's doing well no melena or rectal bleeding reflux symptoms well controlled on Protonix 40 mg daily.  Past Medical History:  Diagnosis Date  . Arthritis   . Atrial fibrillation ([place]) 01/12/2010   2D Echo EF=>55%  . Bipolar disorder ([place])   . Chronic back pain   . COPD (chronic obstructive pulmonary disease) ([place])   . Depression   . Dysrhythmia    AFib  . History of colonic polyps   . History of gout   . Hyperlipidemia   . Hypertension   . Hypothyroidism   . Morbid obesity ([place])   . OSA (obstructive sleep apnea)    AHI was 27.62hr RDI was 34.3 hr REM 0.00hr; had CPAP years ago but no longer uses.  . Prostatitis   . Tubular adenoma of colon 01/2016    Past Surgical History:  Procedure Laterality Date  . APPENDECTOMY  1959  . asthma    . CATARACT EXTRACTION Bilateral 1995  . COLONOSCOPY  2011   RMR: left-sided diverticula, minimal rectal friability. No polyps. Repeat 5 years.  . COLONOSCOPY WITH PROPOFOL N/A 01/15/2016   Dr.Harun Brumley- diverticulosis in the entire examined colon, multiple rectal and colonic polyps, internal hemorrhoids. bx= tubular adenomas and hyperplastic polyps.   Marland Kitchen HAND / FINGER LESION EXCISION Right   . POLYPECTOMY  01/15/2016   Procedure: POLYPECTOMY;  Surgeon: Daneil Dolin, MD;  [facility];  Service: Endoscopy;;  . right shoulder surgery    . TONSILLECTOMY  1955    Prior to Admission medications   Medication Sig Start Date End Date Taking? Authorizing Provider  acetaminophen (TYLENOL) 500 MG tablet Take 1,000 mg by mouth every 6 (six) hours as needed for headache.   Yes [provider]  Albuterol Sulfate  108 (90 Base) MCG/ACT AEPB Inhale 1-2 puffs into the lungs as needed.    Yes [provider]  ALPRAZolam Duanne Moron) 0.5 MG tablet Take 0.5 mg by mouth 2 (two) times daily as needed for anxiety.    Yes [provider]  cholecalciferol (VITAMIN D) 1000 UNITS tablet Take 2,000 Units by mouth daily.   Yes [provider]  hydrochlorothiazide (HYDRODIURIL) 12.5 MG tablet Take 1 tablet (12.5 mg total) by mouth daily. 05/09/16  Yes Croitoru, Mihai, MD  Lactobacillus (DIGESTIVE HEALTH PROBIOTIC) CAPS Take 1 tablet by mouth daily.   Yes [provider]  levothyroxine (SYNTHROID, LEVOTHROID) 25 MCG tablet Take 25 mcg by mouth daily before breakfast.  04/06/15  Yes [provider]  pantoprazole (PROTONIX) 40 MG tablet TAKE ONE TABLET BY MOUTH ONCE DAILY Patient taking differently: TAKE 40mg  TABLET BY MOUTH ONCE DAILY 03/07/17  Yes Carlis Stable, NP  perphenazine (TRILAFON) 4 MG tablet Take 4 mg by mouth 2 (two) times daily.    Yes [provider]  rivaroxaban (XARELTO) 20 MG TABS tablet Take 20 mg by mouth daily after supper.    Yes [provider]  vitamin B-12 (CYANOCOBALAMIN) 500 MCG tablet Take 500 mcg by mouth daily.   Yes [provider]  Wheat Dextrin (BENEFIBER PO) Take by mouth as needed.   Yes [provider]  docusate sodium (COLACE) 50 MG capsule  Take 50 mg by mouth as needed.     [provider]  HYDROcodone-acetaminophen (NORCO) 7.5-325 MG tablet Take 1 tablet by mouth every 6 (six) hours as needed for moderate pain.    [provider]  polyethylene glycol (MIRALAX) 0.34 gm/ml SOLN Take 1 g/kg by mouth as needed. With 4 ounces of water     [provider]    Allergies as of 08/05/2017 - Review Complete 08/05/2017  Allergen Reaction Noted  . Amlodipine Other (See Comments) 11/02/2012  . Vicodin [hydrocodone-acetaminophen] Other (See Comments) 01/15/2016    Family History  Problem Relation  Age of Onset  . Diabetes Mother   . Heart failure Mother   . Hypertension Mother   . Hypertension Father   . Cancer Paternal Grandmother   . Stroke Sister   . Colon cancer Neg Hx     Social History   Socioeconomic History  . Marital status: Married    Spouse name: Not on file  . Number of children: Not on file  . Years of education: Not on file  . Highest education level: Not on file  Social Needs  . Financial resource strain: Not on file  . Food insecurity - worry: Not on file  . Food insecurity - inability: Not on file  . Transportation needs - medical: Not on file  . Transportation needs - non-medical: Not on file  Occupational History  . Not on file  Tobacco Use  . Smoking status: Never Smoker  . Smokeless tobacco: Never Used  Substance and Sexual Activity  . Alcohol use: No    Alcohol/week: 0.0 oz  . Drug use: No  . Sexual activity: Yes    Birth control/protection: None  Other Topics Concern  . Not on file  Social History Narrative  . Not on file    Review of Systems: See HPI, otherwise negative ROS  Physical Exam: BP 133/85   Pulse 66   Temp 97.6 F (36.4 C) (Oral)   Ht 5\' 11"  (1.803 m)   Wt 232 lb 6.4 oz (105.4 kg)   BMI 32.41 kg/m  General:   Alert,  Well-developed, well-nourished, pleasant and cooperative in NAD Neck:  Supple; no masses or thyromegaly. No significant cervical adenopathy. Lungs:  Clear throughout to auscultation.   No wheezes, crackles, or rhonchi. No acute distress. Heart:  Regular rate and rhythm; no murmurs, clicks, rubs,  or gallops. Abdomen: Non-distended mall ventral hernia present. Positive bowel sounds. Soft and nontender without appreciable mass or organomegaly.  Pulses:  Normal pulses noted. Extremities:  Without clubbing or edema.  Impression: Pleasant 72 year old gentleman with long-standing GERD well controlled on Protonix.  History of colonic polyps-due for surveillance examination 2022.  No further rectal bleeding  -   reassuring.  Recommendations: Continue Protonix 40 mg daily  GERD information provided  Repeat colonoscopy summer of 2022  Office visit in 1 year     Notice: This dictation was prepared with Dragon dictation along with smaller phrase technology. Any transcriptional errors that result from this process are unintentional and may not be corrected upon review.

## 2017-08-14 DIAGNOSIS — F319 Bipolar disorder, unspecified: Secondary | ICD-10-CM | POA: Diagnosis not present

## 2017-08-29 DIAGNOSIS — F319 Bipolar disorder, unspecified: Secondary | ICD-10-CM | POA: Diagnosis not present

## 2017-09-15 DIAGNOSIS — F319 Bipolar disorder, unspecified: Secondary | ICD-10-CM | POA: Diagnosis not present

## 2017-10-14 DIAGNOSIS — F319 Bipolar disorder, unspecified: Secondary | ICD-10-CM | POA: Diagnosis not present

## 2017-11-11 DIAGNOSIS — F319 Bipolar disorder, unspecified: Secondary | ICD-10-CM | POA: Diagnosis not present

## 2017-12-10 DIAGNOSIS — F319 Bipolar disorder, unspecified: Secondary | ICD-10-CM | POA: Diagnosis not present

## 2018-01-01 DIAGNOSIS — I10 Essential (primary) hypertension: Secondary | ICD-10-CM | POA: Diagnosis not present

## 2018-01-01 DIAGNOSIS — R7309 Other abnormal glucose: Secondary | ICD-10-CM | POA: Diagnosis not present

## 2018-01-01 DIAGNOSIS — Z6831 Body mass index (BMI) 31.0-31.9, adult: Secondary | ICD-10-CM | POA: Diagnosis not present

## 2018-01-01 DIAGNOSIS — R739 Hyperglycemia, unspecified: Secondary | ICD-10-CM | POA: Diagnosis not present

## 2018-01-01 DIAGNOSIS — Z0001 Encounter for general adult medical examination with abnormal findings: Secondary | ICD-10-CM | POA: Diagnosis not present

## 2018-01-01 DIAGNOSIS — Z125 Encounter for screening for malignant neoplasm of prostate: Secondary | ICD-10-CM | POA: Diagnosis not present

## 2018-01-01 DIAGNOSIS — E782 Mixed hyperlipidemia: Secondary | ICD-10-CM | POA: Diagnosis not present

## 2018-01-01 DIAGNOSIS — E039 Hypothyroidism, unspecified: Secondary | ICD-10-CM | POA: Diagnosis not present

## 2018-01-01 DIAGNOSIS — I4891 Unspecified atrial fibrillation: Secondary | ICD-10-CM | POA: Diagnosis not present

## 2018-01-01 DIAGNOSIS — E785 Hyperlipidemia, unspecified: Secondary | ICD-10-CM | POA: Diagnosis not present

## 2018-01-01 DIAGNOSIS — Z1389 Encounter for screening for other disorder: Secondary | ICD-10-CM | POA: Diagnosis not present

## 2018-01-06 DIAGNOSIS — F319 Bipolar disorder, unspecified: Secondary | ICD-10-CM | POA: Diagnosis not present

## 2018-02-02 DIAGNOSIS — F319 Bipolar disorder, unspecified: Secondary | ICD-10-CM | POA: Diagnosis not present

## 2018-02-27 ENCOUNTER — Ambulatory Visit (INDEPENDENT_AMBULATORY_CARE_PROVIDER_SITE_OTHER): Payer: Medicare Other | Admitting: Urology

## 2018-02-27 DIAGNOSIS — N5201 Erectile dysfunction due to arterial insufficiency: Secondary | ICD-10-CM

## 2018-02-27 DIAGNOSIS — N35919 Unspecified urethral stricture, male, unspecified site: Secondary | ICD-10-CM | POA: Diagnosis not present

## 2018-03-03 DIAGNOSIS — F319 Bipolar disorder, unspecified: Secondary | ICD-10-CM | POA: Diagnosis not present

## 2018-03-20 DIAGNOSIS — F319 Bipolar disorder, unspecified: Secondary | ICD-10-CM | POA: Diagnosis not present

## 2018-04-13 DIAGNOSIS — F319 Bipolar disorder, unspecified: Secondary | ICD-10-CM | POA: Diagnosis not present

## 2018-04-27 ENCOUNTER — Telehealth: Payer: Self-pay | Admitting: Psychiatry

## 2018-04-27 ENCOUNTER — Encounter: Payer: Self-pay | Admitting: Psychiatry

## 2018-05-05 ENCOUNTER — Other Ambulatory Visit: Payer: Self-pay | Admitting: Psychiatry

## 2018-05-11 ENCOUNTER — Ambulatory Visit (INDEPENDENT_AMBULATORY_CARE_PROVIDER_SITE_OTHER): Payer: Medicare Other | Admitting: Psychiatry

## 2018-05-11 DIAGNOSIS — F3162 Bipolar disorder, current episode mixed, moderate: Secondary | ICD-10-CM

## 2018-05-11 DIAGNOSIS — F411 Generalized anxiety disorder: Secondary | ICD-10-CM

## 2018-05-11 MED ORDER — ALPRAZOLAM 0.5 MG PO TABS: 0.5000 mg | ORAL_TABLET | Freq: Every day | ORAL | 1 refills | Status: DC

## 2018-05-11 MED ORDER — OXCARBAZEPINE 600 MG PO TABS: 600.0000 mg | ORAL_TABLET | Freq: Three times a day (TID) | ORAL | 1 refills | Status: DC

## 2018-05-11 NOTE — Progress Notes (Signed)
[facility]  Patient ID: Casey [name],  MRN: [id number]  PCP: Sharilyn Sites, MD  Date of Evaluation: 05/11/2018 Time spent:20 minutes  Chief Complaint:   HISTORY/CURRENT STATUS: HPI patient is a 72 year old white male last seen 04/13/2018.  Carries diagnosis of bipolar disorder and anxiety.  Depression and his anxiety has increased lately overall he has not done well in a while with depression increase the Trileptal last visit and increase his Xanax as needed.  Today is slightly better. Doesn't want to take lamictal due to nausea  Individual Medical History/ Review of Systems: Changes? :No   Allergies: Amlodipine and Vicodin [hydrocodone-acetaminophen]  Current Medications:  Current Outpatient Medications:  .  ALPRAZolam (XANAX) 0.5 MG tablet, Take 1 tablet (0.5 mg total) by mouth daily., Disp: 30 tablet, Rfl: 1 .  hydrochlorothiazide (HYDRODIURIL) 12.5 MG tablet, Take 1 tablet (12.5 mg total) by mouth daily., Disp: 90 tablet, Rfl: 3 .  rivaroxaban (XARELTO) 20 MG TABS tablet, Take 20 mg by mouth daily after supper. , Disp: , Rfl:  .  acetaminophen (TYLENOL) 500 MG tablet, Take 1,000 mg by mouth every 6 (six) hours as needed for headache., Disp: , Rfl:  .  Albuterol Sulfate 108 (90 Base) MCG/ACT AEPB, Inhale 1-2 puffs into the lungs as needed. , Disp: , Rfl:  .  cholecalciferol (VITAMIN D) 1000 UNITS tablet, Take 2,000 Units by mouth daily., Disp: , Rfl:  .  docusate sodium (COLACE) 50 MG capsule, Take 50 mg by mouth as needed. , Disp: , Rfl:  .  HYDROcodone-acetaminophen (NORCO) 7.5-325 MG tablet, Take 1 tablet by mouth every 6 (six) hours as needed for moderate pain., Disp: , Rfl:  .  Lactobacillus (DIGESTIVE HEALTH PROBIOTIC) CAPS, Take 1 tablet by mouth daily., Disp: , Rfl:  .  levothyroxine (SYNTHROID, LEVOTHROID) 25 MCG tablet, Take 25 mcg by mouth daily before breakfast. , Disp: , Rfl:  .  oxcarbazepine (TRILEPTAL) 600 MG tablet, Take 1 tablet (600 mg total) by  mouth 3 (three) times daily., Disp: 90 tablet, Rfl: 1 .  pantoprazole (PROTONIX) 40 MG tablet, TAKE ONE TABLET BY MOUTH ONCE DAILY (Patient not taking: Reported on 05/11/2018), Disp: 30 tablet, Rfl: 11 .  polyethylene glycol (MIRALAX) 0.34 gm/ml SOLN, Take 1 g/kg by mouth as needed. With 4 ounces of water , Disp: , Rfl:  .  vitamin B-12 (CYANOCOBALAMIN) 500 MCG tablet, Take 500 mcg by mouth daily., Disp: , Rfl:  .  Wheat Dextrin (BENEFIBER PO), Take by mouth as needed., Disp: , Rfl:  Medication Side Effects: nausea  Family Medical/ Social History: Changes? no  MENTAL HEALTH EXAM:  There were no vitals taken for this visit.There is no height or weight on file to calculate BMI.  General Appearance: Casual  Eye Contact:  Good  Speech:  Normal Rate  Volume:  Normal  Mood:  Depressed  Affect:  Appropriate  Thought Process:  Linear  Orientation:  Full (Time, Place, and Person)  Thought Content: Logical   Suicidal Thoughts:  No  Homicidal Thoughts:  No  Memory:  normal  Judgement:  Good  Insight:  Good  Psychomotor Activity:  Normal  Concentration:  Concentration: Good  Recall:  Good  Fund of Knowledge: Good  Language: Good  Assets:  Desire for Improvement  ADL's:  Intact  Cognition: WNL  Prognosis:  Good    DIAGNOSES:    ICD-10-CM   1. Anxiety state F41.1   2. Bipolar 1 disorder, mixed, moderate (HCC) F31.62  Receiving Psychotherapy: no   RECOMMENDATIONS: increase trileptal from 300 3 in am and 2 hs. To 600mg  tid. Wean of lamitcal by 1/2 tab/day for 2 weeks. Continue xanax prn Recheck in 6 weeks.   Comer Locket, PA-C

## 2018-05-19 DIAGNOSIS — E6609 Other obesity due to excess calories: Secondary | ICD-10-CM | POA: Diagnosis not present

## 2018-05-19 DIAGNOSIS — H6690 Otitis media, unspecified, unspecified ear: Secondary | ICD-10-CM | POA: Diagnosis not present

## 2018-05-19 DIAGNOSIS — Z6832 Body mass index (BMI) 32.0-32.9, adult: Secondary | ICD-10-CM | POA: Diagnosis not present

## 2018-05-19 DIAGNOSIS — Z1389 Encounter for screening for other disorder: Secondary | ICD-10-CM | POA: Diagnosis not present

## 2018-05-19 DIAGNOSIS — R42 Dizziness and giddiness: Secondary | ICD-10-CM | POA: Diagnosis not present

## 2018-05-20 NOTE — Telephone Encounter (Signed)
No note

## 2018-05-22 ENCOUNTER — Ambulatory Visit: Payer: Medicare Other | Admitting: Cardiovascular Disease

## 2018-05-22 ENCOUNTER — Telehealth: Payer: Self-pay | Admitting: Psychiatry

## 2018-05-22 DIAGNOSIS — F3132 Bipolar disorder, current episode depressed, moderate: Secondary | ICD-10-CM

## 2018-05-22 NOTE — Telephone Encounter (Signed)
Review of pt's chart shows him doing well when on trileptal and lamictal in past. Had elevated trileptal 11/17.  He also had success on carbamazepine ER and Zyprexa He has tried Lithium, Depakote, Equetro, Lamictal, Trileptal, Wellbutrin, Risperdal, Zyprexa, Trilafon, Provigil.  There are several atypical she has not tried.  Is also had success with carbamazepine and Zyprexa.  He has constant atrial fibrillation, no coronary artery disease and no other arrhythmias.  Discussed with Dr. Clovis Pu and ok for atypical antipsychotics.  He has borderline cholesterol.  No hypertension.  Obstructive sleep apnea.  We recently increased his Trileptal level and we could increase his another 600 mg.  Would like to do Trileptal level before we increased.  He is complained of confusion and fatigue in the past will follow labs as needed.  As mentioned there are several atypicals we could try.  Consider Vraylar.  Consider TMS, ECT.  He has sleep apnea, could consider M SLT if needed.  Will order trileptal level. If ok we may  increase trileptal to 2400 mg/day.

## 2018-05-25 ENCOUNTER — Ambulatory Visit (INDEPENDENT_AMBULATORY_CARE_PROVIDER_SITE_OTHER): Payer: Medicare Other | Admitting: Cardiovascular Disease

## 2018-05-25 ENCOUNTER — Encounter: Payer: Self-pay | Admitting: Cardiovascular Disease

## 2018-05-25 VITALS — BP 130/88 | HR 92 | Ht 71.5 in | Wt 226.8 lb

## 2018-05-25 DIAGNOSIS — I10 Essential (primary) hypertension: Secondary | ICD-10-CM

## 2018-05-25 DIAGNOSIS — E669 Obesity, unspecified: Secondary | ICD-10-CM

## 2018-05-25 DIAGNOSIS — F319 Bipolar disorder, unspecified: Secondary | ICD-10-CM | POA: Diagnosis not present

## 2018-05-25 DIAGNOSIS — Z7901 Long term (current) use of anticoagulants: Secondary | ICD-10-CM

## 2018-05-25 DIAGNOSIS — E7800 Pure hypercholesterolemia, unspecified: Secondary | ICD-10-CM | POA: Diagnosis not present

## 2018-05-25 DIAGNOSIS — I4821 Permanent atrial fibrillation: Secondary | ICD-10-CM

## 2018-05-25 NOTE — Patient Instructions (Signed)
Medication Instructions:  None  If you need a refill on your cardiac medications before your next appointment, please call your pharmacy.   Lab work: none  Testing/Procedures: none  Follow-Up: At [facility], you and your health needs are our priority.  As part of our continuing mission to provide you with exceptional heart care, we have created designated Provider Care Teams.  These Care Teams include your primary Cardiologist (physician) and Advanced Practice Providers (APPs -  Physician Assistants and Nurse Practitioners) who all work together to provide you with the care you need, when you need it. You will need a follow up appointment in 1 year. With Dr. Sallyanne Kuster. Please call our office two months ahead of time to schedule your appointment.  Advanced Practice Providers on your designated Care Team: [place], [place] . Fabian Sharp, PA-C  Any Other Special Instructions Will Be Listed Below (If Applicable). none

## 2018-05-25 NOTE — Progress Notes (Signed)
Cardiology Office Note    Date:  05/26/2018   ID:  JAVARES KAUFHOLD, DOB 1946/01/10, MRN [id number]  PCP:  Sharilyn Sites, MD  Cardiologist:   Sanda Klein, MD   No chief complaint on file.   History of Present Illness:  Casey [name] is a 72 y.o. male with permanent atrial fibrillation in the absence of major structural cardiac abnormalities, but with hypertension, hyperlipidemia, obstructive sleep apnea, bipolar disorder and COPD returning for routine follow-up.  He has not had a lot of physical problems since his last appointment but psychologically is not doing well.  He is been in a bout of deep depression for about 12 months and this has not resolved despite multiple medication changes.  He is not troubled by palpitations and atrial fibrillation rate control is adequate without any medications.  He has not had falls, injuries or serious bleeding problems.  I noticed a rash on his forearms but he does not really acknowledge it and it is not pruritic or in any way uncomfortable.  He has not had new focal neurological events.  He denies angina or dyspnea at rest or with activity.  By his most recent echocardiogram in June 2015 has normal left ventricular size and function with an ejection fraction of 55-60% and biatrial dilatation. He does not have known coronary or peripheral arterial disease and a nuclear perfusion study in 2011 was normal.  He has not had diabetes or congestive heart failure.  His blood pressure problem seemed to resolve when he lost weight.  Past Medical History:  Diagnosis Date  . Arthritis   . Atrial fibrillation ([place]) 01/12/2010   2D Echo EF=>55%  . Bipolar disorder ([place])   . Chronic back pain   . COPD (chronic obstructive pulmonary disease) ([place])   . Depression   . Dysrhythmia    AFib  . History of colonic polyps   . History of gout   . Hyperlipidemia   . Hypertension   . Hypothyroidism   . Morbid obesity ([place])   . OSA (obstructive sleep apnea)    AHI was 27.62hr RDI was 34.3 hr REM 0.00hr; had CPAP years ago but no longer uses.  . Prostatitis   . Tubular adenoma of colon 01/2016    Past Surgical History:  Procedure Laterality Date  . APPENDECTOMY  1959  . asthma    . CATARACT EXTRACTION Bilateral 1995  . COLONOSCOPY  2011   RMR: left-sided diverticula, minimal rectal friability. No polyps. Repeat 5 years.  . COLONOSCOPY WITH PROPOFOL N/A 01/15/2016   Dr.Rourk- diverticulosis in the entire examined colon, multiple rectal and colonic polyps, internal hemorrhoids. bx= tubular adenomas and hyperplastic polyps.   Marland Kitchen HAND / FINGER LESION EXCISION Right   . POLYPECTOMY  01/15/2016   Procedure: POLYPECTOMY;  Surgeon: Daneil Dolin, MD;  Location: [facility];  Service: Endoscopy;;  . right shoulder surgery    . TONSILLECTOMY  1955    Current Medications: Outpatient Medications Prior to Visit  Medication Sig Dispense Refill  . acetaminophen (TYLENOL) 500 MG tablet Take 1,000 mg by mouth every 6 (six) hours as needed for headache.    . Albuterol Sulfate 108 (90 Base) MCG/ACT AEPB Inhale 1-2 puffs into the lungs as needed.     . ALPRAZolam (XANAX) 0.5 MG tablet Take 1 tablet (0.5 mg total) by mouth daily. 30 tablet 1  . amoxicillin-clavulanate (AUGMENTIN) 875-125 MG tablet Take 1 tablet by mouth 2 (two) times daily. for 10 days (  done on 05/26/18)  0  . cholecalciferol (VITAMIN D) 1000 UNITS tablet Take 2,000 Units by mouth daily.    . fluticasone (FLONASE) 50 MCG/ACT nasal spray Place 2 sprays into both nostrils daily.  11  . HYDROcodone-acetaminophen (NORCO) 7.5-325 MG tablet Take 1 tablet by mouth every 6 (six) hours as needed for moderate pain.    . Lactobacillus (DIGESTIVE HEALTH PROBIOTIC) CAPS Take 1 tablet by mouth daily.    Marland Kitchen levothyroxine (SYNTHROID, LEVOTHROID) 25 MCG tablet Take 25 mcg by mouth daily before breakfast.     . ondansetron (ZOFRAN-ODT) 4 MG disintegrating tablet Take 1 tablet by mouth 3 (three) times daily as  needed.  2  . oxcarbazepine (TRILEPTAL) 600 MG tablet Take 1 tablet (600 mg total) by mouth 3 (three) times daily. 90 tablet 1  . pantoprazole (PROTONIX) 40 MG tablet TAKE ONE TABLET BY MOUTH ONCE DAILY 30 tablet 11  . rivaroxaban (XARELTO) 20 MG TABS tablet Take 20 mg by mouth daily after supper.     . vitamin B-12 (CYANOCOBALAMIN) 500 MCG tablet Take 500 mcg by mouth daily.    . Wheat Dextrin (BENEFIBER PO) Take by mouth as needed.    . docusate sodium (COLACE) 50 MG capsule Take 50 mg by mouth as needed.     . hydrochlorothiazide (HYDRODIURIL) 12.5 MG tablet Take 1 tablet (12.5 mg total) by mouth daily. 90 tablet 3  . polyethylene glycol (MIRALAX) 0.34 gm/ml SOLN Take 1 g/kg by mouth as needed. With 4 ounces of water      No facility-administered medications prior to visit.      Allergies:   Amlodipine and Vicodin [hydrocodone-acetaminophen]   Social History   Socioeconomic History  . Marital status: Married    Spouse name: Not on file  . Number of children: Not on file  . Years of education: Not on file  . Highest education level: Not on file  Occupational History  . Not on file  Social Needs  . Financial resource strain: Not on file  . Food insecurity:    Worry: Not on file    Inability: Not on file  . Transportation needs:    Medical: Not on file    Non-medical: Not on file  Tobacco Use  . Smoking status: Never Smoker  . Smokeless tobacco: Never Used  Substance and Sexual Activity  . Alcohol use: No    Alcohol/week: 0.0 standard drinks  . Drug use: No  . Sexual activity: Yes    Birth control/protection: None  Lifestyle  . Physical activity:    Days per week: Not on file    Minutes per session: Not on file  . Stress: Not on file  Relationships  . Social connections:    Talks on phone: Not on file    Gets together: Not on file    Attends religious service: Not on file    Active member of club or organization: Not on file    Attends meetings of clubs or  organizations: Not on file    Relationship status: Not on file  Other Topics Concern  . Not on file  Social History Narrative  . Not on file     Family History:  The patient's family history includes Cancer in his paternal grandmother; Diabetes in his mother; Heart failure in his mother; Hypertension in his father and mother; Stroke in his sister.   ROS:   Please see the history of present illness.    ROS All other systems  reviewed and are negative.   PHYSICAL EXAM:   VS:  BP 130/88   Pulse 92   Ht 5' 11.5" (1.816 m)   Wt 226 lb 12.8 oz (102.9 kg)   BMI 31.19 kg/m     General: Alert, oriented x3, no distress, highly obese Head: no evidence of trauma, PERRL, EOMI, no exophtalmos or lid lag, no myxedema, no xanthelasma; normal ears, nose and oropharynx Neck: normal jugular venous pulsations and no hepatojugular reflux; brisk carotid pulses without delay and no carotid bruits Chest: clear to auscultation, no signs of consolidation by percussion or palpation, normal fremitus, symmetrical and full respiratory excursions Cardiovascular: normal position and quality of the apical impulse, irregular rhythm, normal first and second heart sounds, no murmurs, rubs or gallops Abdomen: no tenderness or distention, no masses by palpation, no abnormal pulsatility or arterial bruits, normal bowel sounds, no hepatosplenomegaly Extremities: no clubbing, cyanosis or edema; 2+ radial, ulnar and brachial pulses bilaterally; 2+ right femoral, posterior tibial and dorsalis pedis pulses; 2+ left femoral, posterior tibial and dorsalis pedis pulses; no subclavian or femoral bruits.  Papular erythematous rash overlying the external surface of both forearms. Neurological: grossly nonfocal Psych: Depressed affect   Wt Readings from Last 3 Encounters:  05/25/18 226 lb 12.8 oz (102.9 kg)  08/05/17 232 lb 6.4 oz (105.4 kg)  05/05/17 243 lb (110.2 kg)      Studies/Labs Reviewed:   EKG:  EKG is ordered  today.  The ekg ordered today demonstrates Atrial fibrillation with a rate of 78 bpm, QTC 462 ms  Recent Labs: No results found for requested labs within last 8760 hours.   ASSESSMENT:    1. Permanent atrial fibrillation   2. Long term current use of anticoagulant   3. Hypercholesterolemia   4. Mild obesity   5. Essential hypertension   6. Bipolar disease, chronic (HCC)      PLAN:  In order of problems listed above:  1. AFib: Asymptomatic, spontaneously rate controlled permanent arrhythmia. 2. Xarelto: Well-tolerated without bleeding complications.  Needs periodic review of renal parameters.  Will get labs from his PCP. 3. HLP: He does not have known coronary or peripheral vascular disease. At his request we discontinued his statins a few years ago. 4. Obesity: He is mildly obese.  As in the past when he is depressed he tends to lose weight and no longer requires medications for hypertension.  He weighs about 17 pounds less than he did a year ago 5. Depression: Trouble regulating his antidepressant medications recently.  Denies suicidal ideation.    Medication Adjustments/Labs and Tests Ordered: Current medicines are reviewed at length with the patient today.  Concerns regarding medicines are outlined above.  Medication changes, Labs and Tests ordered today are listed in the Patient Instructions below. Patient Instructions  Medication Instructions:  None  If you need a refill on your cardiac medications before your next appointment, please call your pharmacy.   Lab work: none  Testing/Procedures: none  Follow-Up: At [facility], you and your health needs are our priority.  As part of our continuing mission to provide you with exceptional heart care, we have created designated Provider Care Teams.  These Care Teams include your primary Cardiologist (physician) and Advanced Practice Providers (APPs -  Physician Assistants and Nurse Practitioners) who all work together to  provide you with the care you need, when you need it. You will need a follow up appointment in 1 year. With Dr. Sallyanne Kuster. Please call our office two  months ahead of time to schedule your appointment.  Advanced Practice Providers on your designated Care Team: [place], [place] . Fabian Sharp, PA-C  Any Other Special Instructions Will Be Listed Below (If Applicable). none      Signed, Sanda Klein, MD  05/26/2018 9:38 AM    [facility] [place], [name],   [address] Phone: [phone]; Fax: [phone]

## 2018-06-05 ENCOUNTER — Ambulatory Visit (INDEPENDENT_AMBULATORY_CARE_PROVIDER_SITE_OTHER): Payer: Medicare Other | Admitting: Psychiatry

## 2018-06-05 DIAGNOSIS — F3132 Bipolar disorder, current episode depressed, moderate: Secondary | ICD-10-CM

## 2018-06-05 DIAGNOSIS — F411 Generalized anxiety disorder: Secondary | ICD-10-CM

## 2018-06-05 MED ORDER — QUETIAPINE FUMARATE 300 MG PO TABS: ORAL_TABLET | ORAL | 1 refills | Status: DC

## 2018-06-05 MED ORDER — ALPRAZOLAM 0.5 MG PO TABS: 0.5000 mg | ORAL_TABLET | Freq: Two times a day (BID) | ORAL | 1 refills | Status: DC | PRN

## 2018-06-05 MED ORDER — OXCARBAZEPINE 600 MG PO TABS: 600.0000 mg | ORAL_TABLET | Freq: Three times a day (TID) | ORAL | 1 refills | Status: DC

## 2018-06-05 NOTE — Progress Notes (Signed)
[facility]  Patient ID: Casey [name],  MRN: [id number]  PCP: Sharilyn Sites, MD  Date of Evaluation: 06/05/2018 Time spent:40 minutes  Chief Complaint:   HISTORY/CURRENT STATUS: HPI patient was last seen 05/11/2018.  Depression was worse.  I increase the Trileptal to 600 mg twice daily. Today patient is worse won't get out of bed for motivation had thoughts to hurt himself but no suicidal thoughts.  Sleeping more, decreased appetite, decreased energy and decreased motivation. Also some problems with intermittent confusion.  Individual Medical History/ Review of Systems: Changes? : pt states has 1.5 kidney disease. He is to get more depressed this time of year. His past medical history include hypertension elevated cholesterol chronic A. fib sleep apnea bipolar disorder.  States that his family physician she has 1.5  Chronic renal failure.   Allergies: Amlodipine and Vicodin [hydrocodone-acetaminophen]  Current Medications:  Current Outpatient Medications:  .  acetaminophen (TYLENOL) 500 MG tablet, Take 1,000 mg by mouth every 6 (six) hours as needed for headache., Disp: , Rfl:  .  Albuterol Sulfate 108 (90 Base) MCG/ACT AEPB, Inhale 1-2 puffs into the lungs as needed. , Disp: , Rfl:  .  ALPRAZolam (XANAX) 0.5 MG tablet, Take 1 tablet (0.5 mg total) by mouth 2 (two) times daily as needed for anxiety., Disp: 60 tablet, Rfl: 1 .  amoxicillin-clavulanate (AUGMENTIN) 875-125 MG tablet, Take 1 tablet by mouth 2 (two) times daily. for 10 days (done on 05/26/18), Disp: , Rfl: 0 .  cholecalciferol (VITAMIN D) 1000 UNITS tablet, Take 2,000 Units by mouth daily., Disp: , Rfl:  .  fluticasone (FLONASE) 50 MCG/ACT nasal spray, Place 2 sprays into both nostrils daily., Disp: , Rfl: 11 .  HYDROcodone-acetaminophen (NORCO) 7.5-325 MG tablet, Take 1 tablet by mouth every 6 (six) hours as needed for moderate pain., Disp: , Rfl:  .  Lactobacillus (DIGESTIVE HEALTH PROBIOTIC) CAPS, Take 1  tablet by mouth daily., Disp: , Rfl:  .  levothyroxine (SYNTHROID, LEVOTHROID) 25 MCG tablet, Take 25 mcg by mouth daily before breakfast. , Disp: , Rfl:  .  ondansetron (ZOFRAN-ODT) 4 MG disintegrating tablet, Take 1 tablet by mouth 3 (three) times daily as needed., Disp: , Rfl: 2 .  oxcarbazepine (TRILEPTAL) 600 MG tablet, Take 1 tablet (600 mg total) by mouth 3 (three) times daily., Disp: 90 tablet, Rfl: 1 .  pantoprazole (PROTONIX) 40 MG tablet, TAKE ONE TABLET BY MOUTH ONCE DAILY, Disp: 30 tablet, Rfl: 11 .  QUEtiapine (SEROQUEL) 300 MG tablet, 1/2 tab at bed for 1 week then 1 whole tab at bed, Disp: 30 tablet, Rfl: 1 .  rivaroxaban (XARELTO) 20 MG TABS tablet, Take 20 mg by mouth daily after supper. , Disp: , Rfl:  .  vitamin B-12 (CYANOCOBALAMIN) 500 MCG tablet, Take 500 mcg by mouth daily., Disp: , Rfl:  .  Wheat Dextrin (BENEFIBER PO), Take by mouth as needed., Disp: , Rfl:  Medication Side Effects: sleepiness/  Family Medical/ Social History: Changes? no MENTAL HEALTH EXAM:  There were no vitals taken for this visit.There is no height or weight on file to calculate BMI.  General Appearance: Casual walks with a Frankenstein type gait  Eye Contact:  Good  Speech:  Clear and Coherent  Volume:  Normal  Mood:  Depressed  Affect:  Appropriate  Thought Process:  Linear at times during exam, pauses before he answers questions  Orientation:  Full (Time, Place, and Person)  Thought Content: Logical   Suicidal Thoughts:  No  Homicidal Thoughts:  No  Memory:  WNL  Judgement:  Good  Insight:  Good  Psychomotor Activity:  Decreased  Concentration:  Concentration: Fair  Recall:  Good  Fund of Knowledge: Good  Language: Good  Assets:  Social Support  ADL's:  Intact  Cognition: WNL  Prognosis:  Good    DIAGNOSES:    ICD-10-CM   1. Bipolar affective disorder, currently depressed, moderate ([place]) F31.32   2. Anxiety state F41.1     Receiving Psychotherapy: No     RECOMMENDATIONS; the patient is to get a Trileptal level.  Want to make sure it is not elevated causing confusion.  Him on Seroquel 150 mg at night for a week and then 300 mg.  Continue Xanax, continue Trileptal 600 mg 3 times daily. I will see the patient again in 2 weeks.  Hopefully starting the Seroquel(talked with Dr. Clovis Pu as long standing a fib)  will help with depression and anxiety.  Will need to watch his sleep.  Follow his lipids and glucose he does have elevated cholesterol.   Other meds considered today were lithium had tremors from this in the past and did not want to start it right now.He has been on Abilify Zyprexa Trilafon risperdal latuda.  Currently on Trileptal, has been on Depakote and Equetro and lamictal.   Comer Locket, PA-C

## 2018-06-08 ENCOUNTER — Ambulatory Visit (INDEPENDENT_AMBULATORY_CARE_PROVIDER_SITE_OTHER): Payer: Medicare Other | Admitting: Otolaryngology

## 2018-06-08 DIAGNOSIS — H903 Sensorineural hearing loss, bilateral: Secondary | ICD-10-CM

## 2018-06-08 DIAGNOSIS — R42 Dizziness and giddiness: Secondary | ICD-10-CM | POA: Diagnosis not present

## 2018-06-12 ENCOUNTER — Encounter: Payer: Self-pay | Admitting: Emergency Medicine

## 2018-06-12 DIAGNOSIS — F319 Bipolar disorder, unspecified: Secondary | ICD-10-CM | POA: Insufficient documentation

## 2018-06-12 DIAGNOSIS — F419 Anxiety disorder, unspecified: Secondary | ICD-10-CM | POA: Insufficient documentation

## 2018-06-13 ENCOUNTER — Emergency Department (HOSPITAL_COMMUNITY): Payer: Medicare Other

## 2018-06-13 ENCOUNTER — Other Ambulatory Visit: Payer: Self-pay

## 2018-06-13 ENCOUNTER — Inpatient Hospital Stay (HOSPITAL_COMMUNITY)
Admission: EM | Admit: 2018-06-13 | Discharge: 2018-06-17 | DRG: 871 | Disposition: A | Discharge status: Skilled Nursing Facility | Payer: Medicare Other | Attending: Internal Medicine | Admitting: Internal Medicine

## 2018-06-13 ENCOUNTER — Encounter (HOSPITAL_COMMUNITY): Payer: Self-pay | Admitting: *Deleted

## 2018-06-13 DIAGNOSIS — R296 Repeated falls: Secondary | ICD-10-CM | POA: Diagnosis present

## 2018-06-13 DIAGNOSIS — M25521 Pain in right elbow: Secondary | ICD-10-CM | POA: Diagnosis not present

## 2018-06-13 DIAGNOSIS — J449 Chronic obstructive pulmonary disease, unspecified: Secondary | ICD-10-CM | POA: Diagnosis present

## 2018-06-13 DIAGNOSIS — I10 Essential (primary) hypertension: Secondary | ICD-10-CM | POA: Diagnosis present

## 2018-06-13 DIAGNOSIS — Z8249 Family history of ischemic heart disease and other diseases of the circulatory system: Secondary | ICD-10-CM

## 2018-06-13 DIAGNOSIS — N179 Acute kidney failure, unspecified: Secondary | ICD-10-CM | POA: Diagnosis present

## 2018-06-13 DIAGNOSIS — A4102 Sepsis due to Methicillin resistant Staphylococcus aureus: Principal | ICD-10-CM | POA: Diagnosis present

## 2018-06-13 DIAGNOSIS — E039 Hypothyroidism, unspecified: Secondary | ICD-10-CM | POA: Diagnosis not present

## 2018-06-13 DIAGNOSIS — R41841 Cognitive communication deficit: Secondary | ICD-10-CM | POA: Diagnosis present

## 2018-06-13 DIAGNOSIS — E785 Hyperlipidemia, unspecified: Secondary | ICD-10-CM | POA: Diagnosis present

## 2018-06-13 DIAGNOSIS — F3130 Bipolar disorder, current episode depressed, mild or moderate severity, unspecified: Secondary | ICD-10-CM | POA: Diagnosis not present

## 2018-06-13 DIAGNOSIS — Z23 Encounter for immunization: Secondary | ICD-10-CM | POA: Diagnosis not present

## 2018-06-13 DIAGNOSIS — R404 Transient alteration of awareness: Secondary | ICD-10-CM | POA: Diagnosis not present

## 2018-06-13 DIAGNOSIS — E669 Obesity, unspecified: Secondary | ICD-10-CM | POA: Diagnosis present

## 2018-06-13 DIAGNOSIS — I4821 Permanent atrial fibrillation: Secondary | ICD-10-CM | POA: Diagnosis not present

## 2018-06-13 DIAGNOSIS — R509 Fever, unspecified: Secondary | ICD-10-CM | POA: Diagnosis not present

## 2018-06-13 DIAGNOSIS — Z9842 Cataract extraction status, left eye: Secondary | ICD-10-CM

## 2018-06-13 DIAGNOSIS — R6520 Severe sepsis without septic shock: Secondary | ICD-10-CM | POA: Diagnosis not present

## 2018-06-13 DIAGNOSIS — S59901A Unspecified injury of right elbow, initial encounter: Secondary | ICD-10-CM | POA: Diagnosis not present

## 2018-06-13 DIAGNOSIS — Z888 Allergy status to other drugs, medicaments and biological substances status: Secondary | ICD-10-CM

## 2018-06-13 DIAGNOSIS — Z6831 Body mass index (BMI) 31.0-31.9, adult: Secondary | ICD-10-CM | POA: Diagnosis not present

## 2018-06-13 DIAGNOSIS — Z880 Allergy status to penicillin: Secondary | ICD-10-CM | POA: Diagnosis not present

## 2018-06-13 DIAGNOSIS — N390 Urinary tract infection, site not specified: Secondary | ICD-10-CM | POA: Diagnosis present

## 2018-06-13 DIAGNOSIS — G4733 Obstructive sleep apnea (adult) (pediatric): Secondary | ICD-10-CM | POA: Diagnosis present

## 2018-06-13 DIAGNOSIS — R0902 Hypoxemia: Secondary | ICD-10-CM | POA: Diagnosis not present

## 2018-06-13 DIAGNOSIS — M6281 Muscle weakness (generalized): Secondary | ICD-10-CM | POA: Diagnosis present

## 2018-06-13 DIAGNOSIS — Z823 Family history of stroke: Secondary | ICD-10-CM

## 2018-06-13 DIAGNOSIS — G9341 Metabolic encephalopathy: Secondary | ICD-10-CM | POA: Diagnosis present

## 2018-06-13 DIAGNOSIS — Z833 Family history of diabetes mellitus: Secondary | ICD-10-CM

## 2018-06-13 DIAGNOSIS — A419 Sepsis, unspecified organism: Secondary | ICD-10-CM | POA: Diagnosis present

## 2018-06-13 DIAGNOSIS — Z79899 Other long term (current) drug therapy: Secondary | ICD-10-CM

## 2018-06-13 DIAGNOSIS — G8929 Other chronic pain: Secondary | ICD-10-CM | POA: Diagnosis present

## 2018-06-13 DIAGNOSIS — R4182 Altered mental status, unspecified: Secondary | ICD-10-CM | POA: Diagnosis not present

## 2018-06-13 DIAGNOSIS — N289 Disorder of kidney and ureter, unspecified: Secondary | ICD-10-CM

## 2018-06-13 DIAGNOSIS — Z79890 Hormone replacement therapy (postmenopausal): Secondary | ICD-10-CM

## 2018-06-13 DIAGNOSIS — Z9841 Cataract extraction status, right eye: Secondary | ICD-10-CM | POA: Diagnosis not present

## 2018-06-13 DIAGNOSIS — F250 Schizoaffective disorder, bipolar type: Secondary | ICD-10-CM | POA: Diagnosis present

## 2018-06-13 DIAGNOSIS — Z7951 Long term (current) use of inhaled steroids: Secondary | ICD-10-CM

## 2018-06-13 DIAGNOSIS — S3992XA Unspecified injury of lower back, initial encounter: Secondary | ICD-10-CM | POA: Diagnosis not present

## 2018-06-13 DIAGNOSIS — K5900 Constipation, unspecified: Secondary | ICD-10-CM | POA: Diagnosis present

## 2018-06-13 DIAGNOSIS — G9340 Encephalopathy, unspecified: Secondary | ICD-10-CM | POA: Diagnosis not present

## 2018-06-13 DIAGNOSIS — I4891 Unspecified atrial fibrillation: Secondary | ICD-10-CM | POA: Diagnosis not present

## 2018-06-13 DIAGNOSIS — Z8719 Personal history of other diseases of the digestive system: Secondary | ICD-10-CM | POA: Diagnosis not present

## 2018-06-13 DIAGNOSIS — I499 Cardiac arrhythmia, unspecified: Secondary | ICD-10-CM | POA: Diagnosis not present

## 2018-06-13 DIAGNOSIS — D696 Thrombocytopenia, unspecified: Secondary | ICD-10-CM | POA: Diagnosis present

## 2018-06-13 DIAGNOSIS — I482 Chronic atrial fibrillation, unspecified: Secondary | ICD-10-CM | POA: Diagnosis present

## 2018-06-13 DIAGNOSIS — R2681 Unsteadiness on feet: Secondary | ICD-10-CM | POA: Diagnosis present

## 2018-06-13 DIAGNOSIS — Z79891 Long term (current) use of opiate analgesic: Secondary | ICD-10-CM

## 2018-06-13 DIAGNOSIS — M1990 Unspecified osteoarthritis, unspecified site: Secondary | ICD-10-CM | POA: Diagnosis present

## 2018-06-13 DIAGNOSIS — Z7901 Long term (current) use of anticoagulants: Secondary | ICD-10-CM

## 2018-06-13 DIAGNOSIS — G4730 Sleep apnea, unspecified: Secondary | ICD-10-CM | POA: Diagnosis present

## 2018-06-13 DIAGNOSIS — F319 Bipolar disorder, unspecified: Secondary | ICD-10-CM | POA: Diagnosis present

## 2018-06-13 DIAGNOSIS — S3993XA Unspecified injury of pelvis, initial encounter: Secondary | ICD-10-CM | POA: Diagnosis not present

## 2018-06-13 DIAGNOSIS — R0602 Shortness of breath: Secondary | ICD-10-CM | POA: Diagnosis not present

## 2018-06-13 LAB — COMPREHENSIVE METABOLIC PANEL
ALBUMIN: 3.6 g/dL (ref 3.5–5.0)
ALT: 28 U/L (ref 0–44)
AST: 38 U/L (ref 15–41)
Alkaline Phosphatase: 73 U/L (ref 38–126)
Anion gap: 8 (ref 5–15)
BUN: 31 mg/dL — ABNORMAL HIGH (ref 8–23)
CHLORIDE: 110 mmol/L (ref 98–111)
CO2: 22 mmol/L (ref 22–32)
Calcium: 8.8 mg/dL — ABNORMAL LOW (ref 8.9–10.3)
Creatinine, Ser: 1.54 mg/dL — ABNORMAL HIGH (ref 0.61–1.24)
GFR calc Af Amer: 52 mL/min — ABNORMAL LOW (ref >60)
GFR calc non Af Amer: 45 mL/min — ABNORMAL LOW (ref >60)
Glucose, Bld: 146 mg/dL — ABNORMAL HIGH (ref 70–99)
Potassium: 3.8 mmol/L (ref 3.5–5.1)
Sodium: 140 mmol/L (ref 135–145)
Total Bilirubin: 0.9 mg/dL (ref 0.3–1.2)
Total Protein: 6.9 g/dL (ref 6.5–8.1)

## 2018-06-13 LAB — CBC WITH DIFFERENTIAL/PLATELET
Abs Immature Granulocytes: 0.05 10*3/uL (ref 0.00–0.07)
Basophils Absolute: 0 10*3/uL (ref 0.0–0.1)
Basophils Relative: 0 %
Eosinophils Absolute: 0 10*3/uL (ref 0.0–0.5)
Eosinophils Relative: 0 %
HCT: 41.5 % (ref 39.0–52.0)
HEMOGLOBIN: 13.7 g/dL (ref 13.0–17.0)
Immature Granulocytes: 1 %
LYMPHS PCT: 3 %
Lymphs Abs: 0.3 10*3/uL — ABNORMAL LOW (ref 0.7–4.0)
MCH: 32.5 pg (ref 26.0–34.0)
MCHC: 33 g/dL (ref 30.0–36.0)
MCV: 98.6 fL (ref 80.0–100.0)
MONOS PCT: 7 %
Monocytes Absolute: 0.6 10*3/uL (ref 0.1–1.0)
Neutro Abs: 7.6 10*3/uL (ref 1.7–7.7)
Neutrophils Relative %: 89 %
Platelets: 113 10*3/uL — ABNORMAL LOW (ref 150–400)
RBC: 4.21 MIL/uL — ABNORMAL LOW (ref 4.22–5.81)
RDW: 13.2 % (ref 11.5–15.5)
WBC: 8.6 10*3/uL (ref 4.0–10.5)
nRBC: 0 % (ref 0.0–0.2)

## 2018-06-13 LAB — INFLUENZA PANEL BY PCR (TYPE A & B)
Influenza A By PCR: NEGATIVE
Influenza B By PCR: NEGATIVE

## 2018-06-13 LAB — I-STAT CG4 LACTIC ACID, ED
Lactic Acid, Venous: 1.87 mmol/L (ref 0.5–1.9)
Lactic Acid, Venous: 1.25 mmol/L (ref 0.5–1.9)

## 2018-06-13 LAB — TROPONIN I: Troponin I: <0.03 ng/mL (ref <0.03)

## 2018-06-13 MED ORDER — SODIUM CHLORIDE 0.9 % IV BOLUS (SEPSIS)
1000.0000 mL | Freq: Once | INTRAVENOUS | Status: AC
  Administered 2018-06-13: 1000 mL via INTRAVENOUS

## 2018-06-13 MED ORDER — SODIUM CHLORIDE 0.9 % IV SOLN
500.0000 mg | INTRAVENOUS | Status: DC
  Administered 2018-06-13: 500 mg via INTRAVENOUS
  Filled 2018-06-13: qty 500

## 2018-06-13 MED ORDER — SODIUM CHLORIDE 0.9 % IV BOLUS (SEPSIS)
500.0000 mL | Freq: Once | INTRAVENOUS | Status: AC
  Administered 2018-06-13: 1000 mL via INTRAVENOUS

## 2018-06-13 MED ORDER — SODIUM CHLORIDE 0.9 % IV SOLN
2.0000 g | INTRAVENOUS | Status: DC
  Administered 2018-06-13: 2 g via INTRAVENOUS
  Filled 2018-06-13: qty 20

## 2018-06-13 MED ORDER — IBUPROFEN 800 MG PO TABS
800.0000 mg | ORAL_TABLET | Freq: Once | ORAL | Status: AC
  Administered 2018-06-13: 800 mg via ORAL
  Filled 2018-06-13: qty 1

## 2018-06-13 NOTE — ED Triage Notes (Signed)
Pt arrived to er by ems after being called out for fever, altered mental status, pt reports that he has had several falls since Thursday and has not felt good since then, fever with ems was 104.0, pt given 1000 mg tylenol po by ems enroute to er along with 500cc NS bolus, pt has abrasion noted to rigth temporal area and right elbow area,

## 2018-06-13 NOTE — ED Notes (Signed)
Pt aware of need for urine specimen. Unable to void at this time. Urinal at bedside

## 2018-06-13 NOTE — ED Provider Notes (Signed)
[facility] EMERGENCY DEPARTMENT Provider Note   CSN: [id number] Arrival date & time: 06/13/18  2059     History   Chief Complaint Chief Complaint  Patient presents with  . Fever    HPI Casey [name] is a 72 y.o. male.  Pt presents to the ED today with altered mental status.  When EMS arrived, he had a temp of 104.  He was given 1000 mg of tylenol at 2030 by EMS.  The pt has had multiple falls and has not felt himself.  He is on Xarelto for afib and hit his head.  The pt also c/o right elbow pain.  Pt's wife said he has been unable to ambulate today.  She tried to get him up, but he had no strength to stand.  The pt started having hallucinations tonight around 1730.  He told the wife to give the kids upstairs something to eat.  He was also talking to the TV.  Temp then was over 104.    CHA2DS2/VAS Stroke Risk Points  Current as of 5 minutes ago     4 >= 2 Points: High Risk  1 - 1.99 Points: Medium Risk  0 Points: Low Risk    This is the only CHA2DS2/VAS Stroke Risk Points available for the past  year.:  Last Change: N/A     Details    This score determines the patient's risk of having a stroke if the  patient has atrial fibrillation.       Points Metrics  0 Has Congestive Heart Failure:  No    Current as of 5 minutes ago  0 Has Vascular Disease:  No    Current as of 5 minutes ago  1 Has Hypertension:  Yes    Current as of 5 minutes ago  1 Age:  64    Current as of 5 minutes ago  0 Has Diabetes:  No    Current as of 5 minutes ago  2 Had Stroke:  Yes  Had TIA:  No  Had thromboembolism:  No    Current as of 5 minutes ago  0 Male:  No    Current as of 5 minutes ago              Past Medical History:  Diagnosis Date  . Arthritis   . Atrial fibrillation ([place]) 01/12/2010   2D Echo EF=>55%  . Bipolar disorder ([place])   . Chronic back pain   . COPD (chronic obstructive pulmonary disease) ([place])   . Depression   . Dysrhythmia    AFib  . History of colonic  polyps   . History of gout   . Hyperlipidemia   . Hypertension   . Hypothyroidism   . Morbid obesity ([place])   . OSA (obstructive sleep apnea)    AHI was 27.62hr RDI was 34.3 hr REM 0.00hr; had CPAP years ago but no longer uses.  . Prostatitis   . Tubular adenoma of colon 01/2016    Patient Active Problem List   Diagnosis Date Noted  . Anxiety 06/12/2018  . Bipolar disorder ([place]) 06/12/2018  . Chronic anticoagulation 05/05/2017  . Sleep apnea 05/05/2017  . Edema leg 05/05/2017  . Dysphasia 02/07/2017  . Mild obesity 05/10/2016  . History of colonic polyps   . Diverticulosis of colon without hemorrhage   . Hyperlipidemia LDL goal <130 05/16/2013  . KNEE PAIN 08/07/2009  . KNEE SPRAIN, ACUTE 08/07/2009  . COLONIC POLYPS, HX OF 05/10/2009  .  HYPOTHYROIDISM 05/08/2009  . History of bipolar disorder 05/08/2009  . Essential hypertension 05/08/2009  . Permanent atrial fibrillation 05/08/2009  . HEMATOCHEZIA 05/08/2009  . LOW BACK PAIN, CHRONIC 05/08/2009  . ABDOMINAL PAIN 05/08/2009    Past Surgical History:  Procedure Laterality Date  . APPENDECTOMY  1959  . asthma    . CATARACT EXTRACTION Bilateral 1995  . COLONOSCOPY  2011   RMR: left-sided diverticula, minimal rectal friability. No polyps. Repeat 5 years.  . COLONOSCOPY WITH PROPOFOL N/A 01/15/2016   Dr.Rourk- diverticulosis in the entire examined colon, multiple rectal and colonic polyps, internal hemorrhoids. bx= tubular adenomas and hyperplastic polyps.   Marland Kitchen HAND / FINGER LESION EXCISION Right   . POLYPECTOMY  01/15/2016   Procedure: POLYPECTOMY;  Surgeon: Daneil Dolin, MD;  Location: [facility];  Service: Endoscopy;;  . right shoulder surgery    . TONSILLECTOMY  1955        Home Medications    Prior to Admission medications   Medication Sig Start Date End Date Taking? Authorizing Provider  acetaminophen (TYLENOL) 500 MG tablet Take 1,000 mg by mouth every 6 (six) hours as needed for headache.    [provider]  Albuterol Sulfate 108 (90 Base) MCG/ACT AEPB Inhale 1-2 puffs into the lungs as needed.     [provider]  ALPRAZolam Duanne Moron) 0.5 MG tablet Take 1 tablet (0.5 mg total) by mouth 2 (two) times daily as needed for anxiety. 06/05/18   Shugart, Lissa Hoard, PA-C  amoxicillin-clavulanate (AUGMENTIN) 875-125 MG tablet Take 1 tablet by mouth 2 (two) times daily. for 10 days (done on 05/26/18) 05/19/18   [provider]  buPROPion (WELLBUTRIN XL) 150 MG 24 hr tablet Take 150 mg by mouth. 1/d x 1wk, then 2/d 08/14/17   [provider]  cholecalciferol (VITAMIN D) 1000 UNITS tablet Take 2,000 Units by mouth daily.    [provider]  fluticasone (FLONASE) 50 MCG/ACT nasal spray Place 2 sprays into both nostrils daily. 05/19/18   [provider]  HYDROcodone-acetaminophen (NORCO) 7.5-325 MG tablet Take 1 tablet by mouth every 6 (six) hours as needed for moderate pain.    [provider]  Lactobacillus (DIGESTIVE HEALTH PROBIOTIC) CAPS Take 1 tablet by mouth daily.    [provider]  lamoTRIgine (LAMICTAL) 150 MG tablet Take 150 mg by mouth daily. 04/13/18   [provider]  levothyroxine (SYNTHROID, LEVOTHROID) 25 MCG tablet Take 25 mcg by mouth daily before breakfast.  04/06/15   [provider]  modafinil (PROVIGIL) 100 MG tablet Take 100 mg by mouth every morning. 01/06/18   [provider]  OLANZapine (ZYPREXA) 10 MG tablet Take 10 mg by mouth daily. 05/28/17   [provider]  ondansetron (ZOFRAN-ODT) 4 MG disintegrating tablet Take 1 tablet by mouth 3 (three) times daily as needed. 05/19/18   [provider]  oxcarbazepine (TRILEPTAL) 600 MG tablet Take 1 tablet (600 mg total) by mouth 3 (three) times daily. 06/05/18   Shugart, Lissa Hoard, PA-C  pantoprazole (PROTONIX) 40 MG tablet TAKE ONE TABLET BY MOUTH ONCE DAILY 03/07/17   Carlis Stable, NP  QUEtiapine (SEROQUEL) 300 MG tablet 1/2 tab at bed for 1  week then 1 whole tab at bed 06/05/18   Shugart, Lissa Hoard, PA-C  rivaroxaban (XARELTO) 20 MG TABS tablet Take 20 mg by mouth daily after supper.     [provider]  vitamin B-12 (CYANOCOBALAMIN) 500 MCG tablet Take 500 mcg by mouth daily.  [provider]  Wheat Dextrin (BENEFIBER PO) Take by mouth as needed.    [provider]    Family History Family History  Problem Relation Age of Onset  . Diabetes Mother   . Heart failure Mother   . Hypertension Mother   . Hypertension Father   . Cancer Paternal Grandmother   . Stroke Sister   . Colon cancer Neg Hx     Social History Social History   Tobacco Use  . Smoking status: Never Smoker  . Smokeless tobacco: Never Used  Substance Use Topics  . Alcohol use: No    Alcohol/week: 0.0 standard drinks  . Drug use: No     Allergies   Amlodipine and Vicodin [hydrocodone-acetaminophen]   Review of Systems Review of Systems  Constitutional: Positive for fatigue and fever.  Musculoskeletal:       Right elbow pain  Neurological: Positive for weakness and headaches.  All other systems reviewed and are negative.    Physical Exam Updated Vital Signs BP 112/66   Pulse 97   Temp (!) 101 F (38.3 C) (Oral)   Resp 13   Ht 5\' 11"  (1.803 m)   Wt 102.9 kg   SpO2 95%   BMI 31.64 kg/m   Physical Exam  Constitutional: He is oriented to person, place, and time. He appears well-developed and well-nourished.  HENT:  Head: Normocephalic.    Right Ear: External ear normal.  Left Ear: External ear normal.  Nose: Nose normal.  Mouth/Throat: Mucous membranes are dry.  Eyes: Pupils are equal, round, and reactive to light. Conjunctivae and EOM are normal.  Neck: Normal range of motion. Neck supple.  Cardiovascular: Normal heart sounds and intact distal pulses. An irregularly irregular rhythm present. Tachycardia present.  Pulmonary/Chest: Effort normal and breath sounds normal.  Abdominal: Soft. Bowel sounds  are normal.  Musculoskeletal: Normal range of motion.  Neurological: He is alert and oriented to person, place, and time.  Skin: Skin is warm. Capillary refill takes less than 2 seconds.  Multiple scattered contusions  Psychiatric: He has a normal mood and affect. His behavior is normal. Judgment and thought content normal.  Nursing note and vitals reviewed.    ED Treatments / Results  Labs (all labs ordered are listed, but only abnormal results are displayed) Labs Reviewed  COMPREHENSIVE METABOLIC PANEL - Abnormal; Notable for the following components:      Result Value   Glucose, Bld 146 (*)    BUN 31 (*)    Creatinine, Ser 1.54 (*)    Calcium 8.8 (*)    GFR calc non Af Amer 45 (*)    GFR calc Af Amer 52 (*)    All other components within normal limits  CBC WITH DIFFERENTIAL/PLATELET - Abnormal; Notable for the following components:   RBC 4.21 (*)    Platelets 113 (*)    Lymphs Abs 0.3 (*)    All other components within normal limits  CULTURE, BLOOD (ROUTINE X 2)  CULTURE, BLOOD (ROUTINE X 2)  TROPONIN I  INFLUENZA PANEL BY PCR (TYPE A & B)  URINALYSIS, ROUTINE W REFLEX MICROSCOPIC  LAMOTRIGINE LEVEL  10-HYDROXYCARBAZEPINE  I-STAT CG4 LACTIC ACID, ED  I-STAT CG4 LACTIC ACID, ED    EKG EKG Interpretation  Date/Time:  Saturday June 13 2018 21:08:17 EST Ventricular Rate:  111 PR Interval:    QRS Duration: 87 QT Interval:  306 QTC Calculation: 416 R Axis:   44 Text Interpretation:  Atrial fibrillation Since last  tracing rate faster Confirmed by Isla Pence [phone]) on 06/13/2018 9:11:50 PM   Radiology Dg Chest 2 View  Result Date: 06/13/2018 CLINICAL DATA:  Shortness of breath. Fever. EXAM: CHEST - 2 VIEW COMPARISON:  Chest and rib radiographs 04/25/2016 FINDINGS: Chronic hyperinflation and bronchial thickening.The cardiomediastinal contours are normal. Chronic scarring at the left lung base. Pulmonary vasculature is normal. No consolidation, pleural  effusion, or pneumothorax. No acute osseous abnormalities are seen. IMPRESSION: Chronic hyperinflation and bronchial thickening. Scarring at the left lung base. No acute findings. Electronically Signed   By: Keith Rake M.D.   On: 06/13/2018 23:05   Dg Pelvis 1-2 Views  Result Date: 06/13/2018 CLINICAL DATA:  Post fall. EXAM: PELVIS - 1-2 VIEW COMPARISON:  None. FINDINGS: The cortical margins of the bony pelvis are intact. No fracture. Pubic symphysis and sacroiliac joints are congruent. Both femoral heads are well-seated in the respective acetabula. Bilateral acetabular spurring consistent with mild degenerative change. Enthesopathy at the right hamstring insertion. IMPRESSION: No evidence of pelvic fracture. Electronically Signed   By: Keith Rake M.D.   On: 06/13/2018 23:07   Dg Elbow Complete Right  Result Date: 06/13/2018 CLINICAL DATA:  Right elbow pain.  Fall. EXAM: RIGHT ELBOW - COMPLETE 3+ VIEW COMPARISON:  None. FINDINGS: Lateral view limited by positioning. There is no evidence of fracture, dislocation, or joint effusion. Mild degenerative changes the radiocarpal and ulnar trochlear joints. Enthesopathic changes about the humeral epicondyles. Soft tissues are unremarkable. IMPRESSION: Degenerative change without acute fracture or dislocation of the elbow. Electronically Signed   By: Keith Rake M.D.   On: 06/13/2018 23:08   Ct Head Wo Contrast  Result Date: 06/13/2018 CLINICAL DATA:  Per ED notes: Pt arrived to er by ems after being called out for fever, altered mental status, pt reports that he has had several falls since Thursday and has not felt good since then, fever with ems was 104.0, pt given 1000 mg tylenol po by ems enroute to er along with 500cc NS bolus, pt has abrasion noted to rigth temporal area and right elbow area. EXAM: CT HEAD WITHOUT CONTRAST TECHNIQUE: Contiguous axial images were obtained from the base of the skull through the vertex without intravenous  contrast. COMPARISON:  02/06/2017 FINDINGS: Brain: No evidence of acute infarction, hemorrhage, hydrocephalus, extra-axial collection or mass lesion/mass effect. There is ventricular and sulcal enlargement reflecting mild atrophy. There is periventricular white matter hypoattenuation consistent with mild chronic microvascular ischemic change. These findings are stable. Vascular: No hyperdense vessel or unexpected calcification. Skull: Normal. Negative for fracture or focal lesion. Sinuses/Orbits: Globes and orbits are unremarkable. The sinuses and mastoid air cells are clear Other: None. IMPRESSION: 1. No acute intracranial abnormalities. 2. Mild atrophy and chronic microvascular ischemic change. Electronically Signed   By: Lajean Manes M.D.   On: 06/13/2018 23:08    Procedures Procedures (including critical care time)  Medications Ordered in ED Medications  cefTRIAXone (ROCEPHIN) 2 g in sodium chloride 0.9 % 100 mL IVPB (0 g Intravenous Stopped 06/13/18 2224)  azithromycin (ZITHROMAX) 500 mg in sodium chloride 0.9 % 250 mL IVPB (0 mg Intravenous Stopped 06/13/18 2322)  sodium chloride 0.9 % bolus 1,000 mL (0 mLs Intravenous Stopped 06/13/18 2331)    And  sodium chloride 0.9 % bolus 1,000 mL (1,000 mLs Intravenous New Bag/Given 06/13/18 2117)    And  sodium chloride 0.9 % bolus 1,000 mL (1,000 mLs Intravenous New Bag/Given 06/13/18 2226)    And  sodium chloride 0.9 %  bolus 500 mL (0 mLs Intravenous Stopped 06/13/18 2132)  ibuprofen (ADVIL,MOTRIN) tablet 800 mg (800 mg Oral Given 06/13/18 2122)     Initial Impression / Assessment and Plan / ED Course  I have reviewed the triage vital signs and the nursing notes.  Pertinent labs & imaging results that were available during my care of the patient were reviewed by me and considered in my medical decision making (see chart for details).    CRITICAL CARE Performed by: Isla Pence   Total critical care time: 30 minutes  Critical care  time was exclusive of separately billable procedures and treating other patients.  Critical care was necessary to treat or prevent imminent or life-threatening deterioration.  Critical care was time spent personally by me on the following activities: development of treatment plan with patient and/or surrogate as well as nursing, discussions with consultants, evaluation of patient's response to treatment, examination of patient, obtaining history from patient or surrogate, ordering and performing treatments and interventions, ordering and review of laboratory studies, ordering and review of radiographic studies, pulse oximetry and re-evaluation of patient's condition.  Code sepsis called due to elevated HR, RR, and temp.   Pt's HR up to 140s with afib rvr upon arrival.  Fever treated and pt given sepsis IVFs.  HR now down to 90s.  BP has remained stable.  No fractures, nothing acute on ct head.  Pt given IV rocephin and zithromax for possible pna; however CXR is clear.  Flu is negative.  UA is pending.  Pt d/w Dr. Myna Hidalgo for admission.  Final Clinical Impressions(s) / ED Diagnoses   Final diagnoses:  Metabolic encephalopathy  Atrial fibrillation with RVR ([place])  Fever in adult    ED Discharge Orders    None       Isla Pence, MD 06/13/18 2347

## 2018-06-13 NOTE — ED Notes (Signed)
Patient in CT scan.

## 2018-06-14 DIAGNOSIS — R6520 Severe sepsis without septic shock: Secondary | ICD-10-CM

## 2018-06-14 DIAGNOSIS — I4891 Unspecified atrial fibrillation: Secondary | ICD-10-CM

## 2018-06-14 DIAGNOSIS — G9340 Encephalopathy, unspecified: Secondary | ICD-10-CM

## 2018-06-14 DIAGNOSIS — F3130 Bipolar disorder, current episode depressed, mild or moderate severity, unspecified: Secondary | ICD-10-CM

## 2018-06-14 DIAGNOSIS — N289 Disorder of kidney and ureter, unspecified: Secondary | ICD-10-CM

## 2018-06-14 DIAGNOSIS — E039 Hypothyroidism, unspecified: Secondary | ICD-10-CM

## 2018-06-14 DIAGNOSIS — A419 Sepsis, unspecified organism: Secondary | ICD-10-CM

## 2018-06-14 DIAGNOSIS — D696 Thrombocytopenia, unspecified: Secondary | ICD-10-CM

## 2018-06-14 DIAGNOSIS — G9341 Metabolic encephalopathy: Secondary | ICD-10-CM

## 2018-06-14 LAB — BASIC METABOLIC PANEL
Anion gap: 6 (ref 5–15)
BUN: 29 mg/dL — ABNORMAL HIGH (ref 8–23)
CO2: 22 mmol/L (ref 22–32)
CREATININE: 1.3 mg/dL — AB (ref 0.61–1.24)
Calcium: 7.7 mg/dL — ABNORMAL LOW (ref 8.9–10.3)
Chloride: 112 mmol/L — ABNORMAL HIGH (ref 98–111)
GFR calc Af Amer: >60 mL/min (ref >60)
GFR calc non Af Amer: 55 mL/min — ABNORMAL LOW (ref >60)
Glucose, Bld: 159 mg/dL — ABNORMAL HIGH (ref 70–99)
Potassium: 3.9 mmol/L (ref 3.5–5.1)
Sodium: 140 mmol/L (ref 135–145)

## 2018-06-14 LAB — URINALYSIS, ROUTINE W REFLEX MICROSCOPIC
BACTERIA UA: NONE SEEN
Bilirubin Urine: NEGATIVE
Glucose, UA: NEGATIVE mg/dL
Ketones, ur: NEGATIVE mg/dL
NITRITE: NEGATIVE
Protein, ur: 100 mg/dL — AB
Specific Gravity, Urine: 1.028 (ref 1.005–1.030)
pH: 5 (ref 5.0–8.0)

## 2018-06-14 LAB — BLOOD CULTURE ID PANEL (REFLEXED)
Acinetobacter baumannii: NOT DETECTED
CANDIDA KRUSEI: NOT DETECTED
CANDIDA PARAPSILOSIS: NOT DETECTED
Candida albicans: NOT DETECTED
Candida glabrata: NOT DETECTED
Candida tropicalis: NOT DETECTED
Enterobacter cloacae complex: NOT DETECTED
Enterobacteriaceae species: NOT DETECTED
Enterococcus species: NOT DETECTED
Escherichia coli: NOT DETECTED
Haemophilus influenzae: NOT DETECTED
Klebsiella oxytoca: NOT DETECTED
Klebsiella pneumoniae: NOT DETECTED
Listeria monocytogenes: NOT DETECTED
Methicillin resistance: DETECTED — AB
Neisseria meningitidis: NOT DETECTED
Proteus species: NOT DETECTED
Pseudomonas aeruginosa: NOT DETECTED
SERRATIA MARCESCENS: NOT DETECTED
Staphylococcus aureus (BCID): NOT DETECTED
Staphylococcus species: DETECTED — AB
Streptococcus agalactiae: NOT DETECTED
Streptococcus pneumoniae: NOT DETECTED
Streptococcus pyogenes: NOT DETECTED
Streptococcus species: NOT DETECTED

## 2018-06-14 LAB — CBC WITH DIFFERENTIAL/PLATELET
Abs Immature Granulocytes: 0.06 10*3/uL (ref 0.00–0.07)
Basophils Absolute: 0 10*3/uL (ref 0.0–0.1)
Basophils Relative: 0 %
EOS PCT: 0 %
Eosinophils Absolute: 0 10*3/uL (ref 0.0–0.5)
HCT: 36.9 % — ABNORMAL LOW (ref 39.0–52.0)
Hemoglobin: 12 g/dL — ABNORMAL LOW (ref 13.0–17.0)
Immature Granulocytes: 1 %
Lymphocytes Relative: 7 %
Lymphs Abs: 0.6 10*3/uL — ABNORMAL LOW (ref 0.7–4.0)
MCH: 32.5 pg (ref 26.0–34.0)
MCHC: 32.5 g/dL (ref 30.0–36.0)
MCV: 100 fL (ref 80.0–100.0)
Monocytes Absolute: 0.7 10*3/uL (ref 0.1–1.0)
Monocytes Relative: 8 %
Neutro Abs: 7.2 10*3/uL (ref 1.7–7.7)
Neutrophils Relative %: 84 %
Platelets: 105 10*3/uL — ABNORMAL LOW (ref 150–400)
RBC: 3.69 MIL/uL — ABNORMAL LOW (ref 4.22–5.81)
RDW: 13.4 % (ref 11.5–15.5)
WBC: 8.6 10*3/uL (ref 4.0–10.5)
nRBC: 0 % (ref 0.0–0.2)

## 2018-06-14 MED ORDER — ALPRAZOLAM 0.5 MG PO TABS: 0.5000 mg | ORAL_TABLET | Freq: Two times a day (BID) | ORAL | Status: DC | PRN

## 2018-06-14 MED ORDER — INFLUENZA VAC SPLIT HIGH-DOSE 0.5 ML IM SUSY
0.5000 mL | PREFILLED_SYRINGE | INTRAMUSCULAR | Status: AC
  Administered 2018-06-15: 0.5 mL via INTRAMUSCULAR
  Filled 2018-06-14: qty 0.5

## 2018-06-14 MED ORDER — SENNOSIDES-DOCUSATE SODIUM 8.6-50 MG PO TABS
1.0000 | ORAL_TABLET | Freq: Every evening | ORAL | Status: DC | PRN
  Administered 2018-06-14 – 2018-06-15 (×2): 1 via ORAL
  Filled 2018-06-14 (×2): qty 1

## 2018-06-14 MED ORDER — SODIUM CHLORIDE 0.9 % IV SOLN
1.0000 g | INTRAVENOUS | Status: DC
  Administered 2018-06-14: 1 g via INTRAVENOUS
  Filled 2018-06-14 (×3): qty 10

## 2018-06-14 MED ORDER — SODIUM CHLORIDE 0.9 % IV SOLN
INTRAVENOUS | Status: AC
  Administered 2018-06-14: 02:00:00 via INTRAVENOUS

## 2018-06-14 MED ORDER — ALPRAZOLAM 0.5 MG PO TABS
0.5000 mg | ORAL_TABLET | Freq: Two times a day (BID) | ORAL | Status: DC | PRN
  Administered 2018-06-14: 0.5 mg via ORAL
  Filled 2018-06-14: qty 1

## 2018-06-14 MED ORDER — ONDANSETRON HCL 4 MG PO TABS: 4.0000 mg | ORAL_TABLET | Freq: Four times a day (QID) | ORAL | Status: DC | PRN

## 2018-06-14 MED ORDER — QUETIAPINE FUMARATE 100 MG PO TABS
300.0000 mg | ORAL_TABLET | Freq: Every day | ORAL | Status: DC
  Administered 2018-06-14 – 2018-06-16 (×4): 300 mg via ORAL
  Filled 2018-06-14 (×4): qty 3

## 2018-06-14 MED ORDER — HYDROCODONE-ACETAMINOPHEN 5-325 MG PO TABS: 1.0000 | ORAL_TABLET | ORAL | Status: DC | PRN

## 2018-06-14 MED ORDER — SODIUM CHLORIDE 0.9% FLUSH
3.0000 mL | Freq: Two times a day (BID) | INTRAVENOUS | Status: DC
  Administered 2018-06-14 – 2018-06-17 (×7): 3 mL via INTRAVENOUS

## 2018-06-14 MED ORDER — PANTOPRAZOLE SODIUM 40 MG PO TBEC
40.0000 mg | DELAYED_RELEASE_TABLET | Freq: Every day | ORAL | Status: DC
  Administered 2018-06-14 – 2018-06-17 (×4): 40 mg via ORAL
  Filled 2018-06-14 (×4): qty 1

## 2018-06-14 MED ORDER — OXCARBAZEPINE 300 MG PO TABS
600.0000 mg | ORAL_TABLET | Freq: Three times a day (TID) | ORAL | Status: DC
  Administered 2018-06-14 – 2018-06-17 (×10): 600 mg via ORAL
  Filled 2018-06-14 (×16): qty 2

## 2018-06-14 MED ORDER — OLANZAPINE 5 MG PO TABS
10.0000 mg | ORAL_TABLET | Freq: Every day | ORAL | Status: DC
  Administered 2018-06-14 – 2018-06-17 (×4): 10 mg via ORAL
  Filled 2018-06-14 (×4): qty 2

## 2018-06-14 MED ORDER — ALBUTEROL SULFATE (2.5 MG/3ML) 0.083% IN NEBU: 3.0000 mL | INHALATION_SOLUTION | Freq: Four times a day (QID) | RESPIRATORY_TRACT | Status: DC | PRN

## 2018-06-14 MED ORDER — MODAFINIL 200 MG PO TABS
100.0000 mg | ORAL_TABLET | Freq: Every day | ORAL | Status: DC
  Administered 2018-06-14 – 2018-06-17 (×4): 100 mg via ORAL
  Filled 2018-06-14 (×4): qty 1

## 2018-06-14 MED ORDER — ACETAMINOPHEN 325 MG PO TABS
650.0000 mg | ORAL_TABLET | Freq: Four times a day (QID) | ORAL | Status: DC | PRN
  Administered 2018-06-14 – 2018-06-16 (×4): 650 mg via ORAL
  Filled 2018-06-14 (×4): qty 2

## 2018-06-14 MED ORDER — ACETAMINOPHEN 650 MG RE SUPP: 650.0000 mg | Freq: Four times a day (QID) | RECTAL | Status: DC | PRN

## 2018-06-14 MED ORDER — BUPROPION HCL ER (XL) 150 MG PO TB24: 150.0000 mg | ORAL_TABLET | Freq: Every day | ORAL | Status: DC

## 2018-06-14 MED ORDER — ONDANSETRON HCL 4 MG/2ML IJ SOLN: 4.0000 mg | Freq: Four times a day (QID) | INTRAMUSCULAR | Status: DC | PRN

## 2018-06-14 MED ORDER — LEVOTHYROXINE SODIUM 50 MCG PO TABS
25.0000 ug | ORAL_TABLET | Freq: Every day | ORAL | Status: DC
  Administered 2018-06-14 – 2018-06-17 (×4): 25 ug via ORAL
  Filled 2018-06-14 (×4): qty 1

## 2018-06-14 MED ORDER — RIVAROXABAN 20 MG PO TABS
20.0000 mg | ORAL_TABLET | Freq: Every day | ORAL | Status: DC
  Administered 2018-06-14 – 2018-06-16 (×3): 20 mg via ORAL
  Filled 2018-06-14 (×3): qty 1

## 2018-06-14 NOTE — H&P (Signed)
History and Physical    Casey [name] ASN:[id number] DOB: Aug 11, 1945 DOA: 06/13/2018  PCP: Sharilyn Sites, MD   Patient coming from: Home   Chief Complaint: Fever, dysuria, confusion, hallucinations, falls   HPI: LIO WEHRLY is a 72 y.o. male with medical history significant for bipolar disorder, atrial fibrillation on Xarelto, OSA, hypothyroidism, and COPD, now presenting to the emergency department for evaluation of fevers, confusion, hallucinations, dysuria, and falls.  Patient is accompanied by his wife and daughter who assist with the history.  He has had waxing and waning confusion with hallucinations and off-balance gait for the past 3 days, was found to be febrile, and he complains of some mild dysuria but denies flank pain.  He has some superficial abrasions from his recent falls, denies losing consciousness, but reports some pain at the elbow, hips, and low back.  He denies rhinorrhea, sore throat, shortness of breath, or cough.  Denies abdominal pain, nausea, or vomiting.  Reports some recent constipation that he treated effectively with stool softener at home.  Denies any suicidal or homicidal ideation, but reports that his depression has been more severe for about a year now, following with behavioral health for adjustment in his medications.  No sick contacts.   ED Course: Upon arrival to the ED, patient is found to be febrile to 38.3 C, saturating well on room air, tachycardic to 140 initially, and with stable blood pressure.  EKG features atrial fibrillation with rate 111.  Noncontrast head CT is negative for acute intracranial abnormality.  Chest x-ray features chronic hyperinflation and bronchial thickening without acute findings.  Radiographs of the pelvis, right elbow, and the lumbar spine are negative for acute findings.  Patient was given 800 mg Advil, blood cultures were collected, 30 cc/kg NS bolus was administered, and the patient was started on empiric antibiotics.  Heart  rate normalized, blood pressure remained stable, and the patient will be admitted for ongoing evaluation and management of sepsis suspected secondary to UTI.  Review of Systems:  All other systems reviewed and apart from HPI, are negative.  Past Medical History:  Diagnosis Date  . Arthritis   . Atrial fibrillation ([place]) 01/12/2010   2D Echo EF=>55%  . Bipolar disorder ([place])   . Chronic back pain   . COPD (chronic obstructive pulmonary disease) ([place])   . Depression   . Dysrhythmia    AFib  . History of colonic polyps   . History of gout   . Hyperlipidemia   . Hypertension   . Hypothyroidism   . Morbid obesity ([place])   . OSA (obstructive sleep apnea)    AHI was 27.62hr RDI was 34.3 hr REM 0.00hr; had CPAP years ago but no longer uses.  . Prostatitis   . Tubular adenoma of colon 01/2016    Past Surgical History:  Procedure Laterality Date  . APPENDECTOMY  1959  . asthma    . CATARACT EXTRACTION Bilateral 1995  . COLONOSCOPY  2011   RMR: left-sided diverticula, minimal rectal friability. No polyps. Repeat 5 years.  . COLONOSCOPY WITH PROPOFOL N/A 01/15/2016   Dr.Rourk- diverticulosis in the entire examined colon, multiple rectal and colonic polyps, internal hemorrhoids. bx= tubular adenomas and hyperplastic polyps.   Marland Kitchen HAND / FINGER LESION EXCISION Right   . POLYPECTOMY  01/15/2016   Procedure: POLYPECTOMY;  Surgeon: Daneil Dolin, MD;  Location: [facility];  Service: Endoscopy;;  . right shoulder surgery    . TONSILLECTOMY  1955  reports that he has never smoked. He has never used smokeless tobacco. He reports that he does not drink alcohol or use drugs.  Allergies  Allergen Reactions  . Amlodipine Other (See Comments)    Stopped due to kidney issues  . Vicodin [Hydrocodone-Acetaminophen] Other (See Comments)    Patient is unsure of reaction    Family History  Problem Relation Age of Onset  . Diabetes Mother   . Heart failure Mother   . Hypertension Mother     . Hypertension Father   . Cancer Paternal Grandmother   . Stroke Sister   . Colon cancer Neg Hx      Prior to Admission medications   Medication Sig Start Date End Date Taking? Authorizing Provider  acetaminophen (TYLENOL) 500 MG tablet Take 1,000 mg by mouth every 6 (six) hours as needed for headache.    [provider]  Albuterol Sulfate 108 (90 Base) MCG/ACT AEPB Inhale 1-2 puffs into the lungs as needed.     [provider]  ALPRAZolam Duanne Moron) 0.5 MG tablet Take 1 tablet (0.5 mg total) by mouth 2 (two) times daily as needed for anxiety. 06/05/18   Shugart, Lissa Hoard, PA-C  buPROPion (WELLBUTRIN XL) 150 MG 24 hr tablet Take 150 mg by mouth. 1/d x 1wk, then 2/d 08/14/17   [provider]  cholecalciferol (VITAMIN D) 1000 UNITS tablet Take 2,000 Units by mouth daily.    [provider]  fluticasone (FLONASE) 50 MCG/ACT nasal spray Place 2 sprays into both nostrils daily. 05/19/18   [provider]  HYDROcodone-acetaminophen (NORCO) 7.5-325 MG tablet Take 1 tablet by mouth every 6 (six) hours as needed for moderate pain.    [provider]  Lactobacillus (DIGESTIVE HEALTH PROBIOTIC) CAPS Take 1 tablet by mouth daily.    [provider]  lamoTRIgine (LAMICTAL) 150 MG tablet Take 150 mg by mouth daily. 04/13/18   [provider]  levothyroxine (SYNTHROID, LEVOTHROID) 25 MCG tablet Take 25 mcg by mouth daily before breakfast.  04/06/15   [provider]  modafinil (PROVIGIL) 100 MG tablet Take 100 mg by mouth every morning. 01/06/18   [provider]  OLANZapine (ZYPREXA) 10 MG tablet Take 10 mg by mouth daily. 05/28/17   [provider]  ondansetron (ZOFRAN-ODT) 4 MG disintegrating tablet Take 1 tablet by mouth 3 (three) times daily as needed. 05/19/18   [provider]  oxcarbazepine (TRILEPTAL) 600 MG tablet Take 1 tablet (600 mg total) by mouth 3 (three) times daily. 06/05/18   Shugart, Lissa Hoard,  PA-C  pantoprazole (PROTONIX) 40 MG tablet TAKE ONE TABLET BY MOUTH ONCE DAILY 03/07/17   Carlis Stable, NP  QUEtiapine (SEROQUEL) 300 MG tablet 1/2 tab at bed for 1 week then 1 whole tab at bed 06/05/18   Shugart, Lissa Hoard, PA-C  rivaroxaban (XARELTO) 20 MG TABS tablet Take 20 mg by mouth daily after supper.     [provider]  vitamin B-12 (CYANOCOBALAMIN) 500 MCG tablet Take 500 mcg by mouth daily.    [provider]  Wheat Dextrin (BENEFIBER PO) Take by mouth as needed.    [provider]    Physical Exam: Vitals:   06/13/18 2200 06/13/18 2300 06/13/18 2330 06/14/18 0000  BP: 111/64 111/74 112/66 102/67  Pulse: 90 90 97 82  Resp: (!) 23 20 13 16   Temp:    98.6 F (37 C)  TempSrc:    Oral  SpO2: 95% 95% 95% 95%  Weight:  Height:         Constitutional: NAD, calm  Eyes: PERTLA, lids and conjunctivae normal ENMT: Mucous membranes are moist. Posterior pharynx clear of any exudate or lesions.   Neck: normal, supple, no masses, no thyromegaly Respiratory: clear to auscultation bilaterally, no wheezing, no crackles. Normal respiratory effort.    Cardiovascular: rate ~100 and irregular. No extremity edema.   Abdomen: No distension, no tenderness, soft. Bowel sounds normal.  Musculoskeletal: no clubbing / cyanosis. No joint deformity upper and lower extremities.   Skin: Scattered abrasions and ecchymoses. Warm, dry, well-perfused. Neurologic: CN 2-12 grossly intact. Sensation to light touch intact. Strength 5/5 in all 4 limbs.  Psychiatric: Alert and oriented to person, place, and situation. Pleasant, cooperative.     Labs on Admission: I have personally reviewed following labs and imaging studies  CBC: Recent Labs  Lab 06/13/18 2114  WBC 8.6  NEUTROABS 7.6  HGB 13.7  HCT 41.5  MCV 98.6  PLT 035*   Basic Metabolic Panel: Recent Labs  Lab 06/13/18 2114  NA 140  K 3.8  CL 110  CO2 22  GLUCOSE 146*  BUN 31*  CREATININE 1.54*  CALCIUM 8.8*    GFR: Estimated Creatinine Clearance: 54.5 mL/min (A) (by C-G formula based on SCr of 1.54 mg/dL (H)). Liver Function Tests: Recent Labs  Lab 06/13/18 2114  AST 38  ALT 28  ALKPHOS 73  BILITOT 0.9  PROT 6.9  ALBUMIN 3.6   No results for input(s): LIPASE, AMYLASE in the last 168 hours. No results for input(s): AMMONIA in the last 168 hours. Coagulation Profile: No results for input(s): INR, PROTIME in the last 168 hours. Cardiac Enzymes: Recent Labs  Lab 06/13/18 2114  TROPONINI <0.03   BNP (last 3 results) No results for input(s): PROBNP in the last 8760 hours. HbA1C: No results for input(s): HGBA1C in the last 72 hours. CBG: No results for input(s): GLUCAP in the last 168 hours. Lipid Profile: No results for input(s): CHOL, HDL, LDLCALC, TRIG, CHOLHDL, LDLDIRECT in the last 72 hours. Thyroid Function Tests: No results for input(s): TSH, T4TOTAL, FREET4, T3FREE, THYROIDAB in the last 72 hours. Anemia Panel: No results for input(s): VITAMINB12, FOLATE, FERRITIN, TIBC, IRON, RETICCTPCT in the last 72 hours. Urine analysis:    Component Value Date/Time   COLORURINE YELLOW 02/14/2017 1542   APPEARANCEUR HAZY (A) 02/14/2017 1542   LABSPEC 1.012 02/14/2017 1542   PHURINE 5.0 02/14/2017 1542   GLUCOSEU NEGATIVE 02/14/2017 1542   HGBUR NEGATIVE 02/14/2017 1542   BILIRUBINUR NEGATIVE 02/14/2017 1542   KETONESUR NEGATIVE 02/14/2017 1542   PROTEINUR NEGATIVE 02/14/2017 1542   NITRITE NEGATIVE 02/14/2017 1542   LEUKOCYTESUR SMALL (A) 02/14/2017 1542   Sepsis Labs: @LABRCNTIP (procalcitonin:4,lacticidven:4) ) Recent Results (from the past 240 hour(s))  Blood Culture (routine x 2)     Status: None (Preliminary result)   Collection Time: 06/13/18  9:14 PM  Result Value Ref Range Status   Specimen Description BLOOD RIGHT HAND  Final   Special Requests   Final    BOTTLES DRAWN AEROBIC AND ANAEROBIC Blood Culture adequate volume Performed at [facility], [address] [place], [place] [address]    Culture PENDING  Incomplete   Report Status PENDING  Incomplete  Blood Culture (routine x 2)     Status: None (Preliminary result)   Collection Time: 06/13/18  9:19 PM  Result Value Ref Range Status   Specimen Description BLOOD RIGHT ARM  Final   Special Requests  Final    BOTTLES DRAWN AEROBIC AND ANAEROBIC Blood Culture adequate volume Performed at [facility], [address] [place], [place] [address]    Culture PENDING  Incomplete   Report Status PENDING  Incomplete     Radiological Exams on Admission: Dg Chest 2 View  Result Date: 06/13/2018 CLINICAL DATA:  Shortness of breath. Fever. EXAM: CHEST - 2 VIEW COMPARISON:  Chest and rib radiographs 04/25/2016 FINDINGS: Chronic hyperinflation and bronchial thickening.The cardiomediastinal contours are normal. Chronic scarring at the left lung base. Pulmonary vasculature is normal. No consolidation, pleural effusion, or pneumothorax. No acute osseous abnormalities are seen. IMPRESSION: Chronic hyperinflation and bronchial thickening. Scarring at the left lung base. No acute findings. Electronically Signed   By: Keith Rake M.D.   On: 06/13/2018 23:05   Dg Pelvis 1-2 Views  Result Date: 06/13/2018 CLINICAL DATA:  Post fall. EXAM: PELVIS - 1-2 VIEW COMPARISON:  None. FINDINGS: The cortical margins of the bony pelvis are intact. No fracture. Pubic symphysis and sacroiliac joints are congruent. Both femoral heads are well-seated in the respective acetabula. Bilateral acetabular spurring consistent with mild degenerative change. Enthesopathy at the right hamstring insertion. IMPRESSION: No evidence of pelvic fracture. Electronically Signed   By: Keith Rake M.D.   On: 06/13/2018 23:07   Dg Elbow Complete Right  Result Date: 06/13/2018 CLINICAL DATA:  Right elbow pain.  Fall. EXAM: RIGHT ELBOW - COMPLETE 3+ VIEW COMPARISON:  None. FINDINGS: Lateral view limited by positioning. There is no evidence of  fracture, dislocation, or joint effusion. Mild degenerative changes the radiocarpal and ulnar trochlear joints. Enthesopathic changes about the humeral epicondyles. Soft tissues are unremarkable. IMPRESSION: Degenerative change without acute fracture or dislocation of the elbow. Electronically Signed   By: Keith Rake M.D.   On: 06/13/2018 23:08   Ct Head Wo Contrast  Result Date: 06/13/2018 CLINICAL DATA:  Per ED notes: Pt arrived to er by ems after being called out for fever, altered mental status, pt reports that he has had several falls since Thursday and has not felt good since then, fever with ems was 104.0, pt given 1000 mg tylenol po by ems enroute to er along with 500cc NS bolus, pt has abrasion noted to rigth temporal area and right elbow area. EXAM: CT HEAD WITHOUT CONTRAST TECHNIQUE: Contiguous axial images were obtained from the base of the skull through the vertex without intravenous contrast. COMPARISON:  02/06/2017 FINDINGS: Brain: No evidence of acute infarction, hemorrhage, hydrocephalus, extra-axial collection or mass lesion/mass effect. There is ventricular and sulcal enlargement reflecting mild atrophy. There is periventricular white matter hypoattenuation consistent with mild chronic microvascular ischemic change. These findings are stable. Vascular: No hyperdense vessel or unexpected calcification. Skull: Normal. Negative for fracture or focal lesion. Sinuses/Orbits: Globes and orbits are unremarkable. The sinuses and mastoid air cells are clear Other: None. IMPRESSION: 1. No acute intracranial abnormalities. 2. Mild atrophy and chronic microvascular ischemic change. Electronically Signed   By: Lajean Manes M.D.   On: 06/13/2018 23:08    EKG: Independently reviewed. Atrial fibrillation, rate 111.   Assessment/Plan  1. Sepsis, suspected secondary to UTI   - Presents with fever, confusion, and hallucinations  - He reports dysuria, denies respiratory or GI sxs, influenza PCR  negative  - UA has been sent, not yet resulted  - Blood cultures collected in ED, 30 cc/kg NS bolus given, and he was started on empiric antibiotics in ED  - Follow-up UA, continue empiric Rocephin for now, follow cultures  and clinical course   2. Renal insufficiency  - SCr is 1.54 on admission, up from 1.15 more than a year ago  - Possibly an acute component in setting of acute infection  - He was fluid-resuscitated in ED, will repeat chem panel in am  3. Acute encephalopathy   - Family reports recent confusion and hallucinations that seemed to resolve in ED after IVF and antipyretics  - Head CT is negative for acute intracranial abnormality  - Trileptal level is pending  - Continue to treat infection, continue supportive care    4. Atrial fibrillation  - Rate was 140 initially and has normalized with IVF and antipyretics  - CHADS-VASc is at least 2 (age, HLD)  - Continue Xarelto    5. Thrombocytopenia  - Platelets 113,000 on admission without bleeding  - Likely secondary to infection  - Repeat CBC in am   6. Bipolar disorder  - Has been struggling with depression for the past year, following with behavioral health with adjustment of medications, denies SI or HI, was having hallucinations in setting of acute febrile illness  - Continue current regimen    7. Hypothyroidism  - Continue Synthroid     DVT prophylaxis: Xarelto  Code Status: Full  Family Communication: Wife and daughter updated at bedside  Consults called: None Admission status: Inpatient     Vianne Bulls, MD [facility] Pager [phone]  If 7PM-7AM, please contact night-coverage [email or web address] Password [email or web address]  06/14/2018, 12:19 AM

## 2018-06-14 NOTE — Progress Notes (Signed)
Patient is refusing the use of CPAP for tonight. RT informed patient to have RN contact RT if he changes his mind.

## 2018-06-14 NOTE — Progress Notes (Signed)
Patient seen and examined.  Admitted after midnight secondary to fever, dysuria, confusion/hallucinations and falls.  Patient reported to be significantly weak and tired.  Work-up suggesting sepsis in the setting of UTI.  Continue IV fluids, continue empiric IV antibiotics, follow culture results, minimize medications that can increase sedation and altered mental status changes.  Follow clinical response.  Please refer to H&P written by Dr. Myna Hidalgo on 06/14/2018 for further info/details on admission.  Barton Dubois MD [phone]

## 2018-06-15 ENCOUNTER — Telehealth: Payer: Self-pay | Admitting: Psychiatry

## 2018-06-15 DIAGNOSIS — G4730 Sleep apnea, unspecified: Secondary | ICD-10-CM

## 2018-06-15 DIAGNOSIS — I4821 Permanent atrial fibrillation: Secondary | ICD-10-CM

## 2018-06-15 LAB — BASIC METABOLIC PANEL
Anion gap: 6 (ref 5–15)
BUN: 21 mg/dL (ref 8–23)
CO2: 24 mmol/L (ref 22–32)
Calcium: 8 mg/dL — ABNORMAL LOW (ref 8.9–10.3)
Chloride: 110 mmol/L (ref 98–111)
Creatinine, Ser: 1.12 mg/dL (ref 0.61–1.24)
GFR calc Af Amer: >60 mL/min (ref >60)
GFR calc non Af Amer: >60 mL/min (ref >60)
GLUCOSE: 125 mg/dL — AB (ref 70–99)
Potassium: 3.5 mmol/L (ref 3.5–5.1)
Sodium: 140 mmol/L (ref 135–145)

## 2018-06-15 LAB — CBC
HCT: 37.3 % — ABNORMAL LOW (ref 39.0–52.0)
Hemoglobin: 12.2 g/dL — ABNORMAL LOW (ref 13.0–17.0)
MCH: 32.4 pg (ref 26.0–34.0)
MCHC: 32.7 g/dL (ref 30.0–36.0)
MCV: 98.9 fL (ref 80.0–100.0)
Platelets: 100 10*3/uL — ABNORMAL LOW (ref 150–400)
RBC: 3.77 MIL/uL — ABNORMAL LOW (ref 4.22–5.81)
RDW: 13.5 % (ref 11.5–15.5)
WBC: 5.6 10*3/uL (ref 4.0–10.5)
nRBC: 0 % (ref 0.0–0.2)

## 2018-06-15 LAB — URINE CULTURE: Culture: <10000 — AB

## 2018-06-15 LAB — HIV ANTIBODY (ROUTINE TESTING W REFLEX): HIV SCREEN 4TH GENERATION: NONREACTIVE

## 2018-06-15 MED ORDER — SODIUM CHLORIDE 0.9 % IV SOLN
2.0000 g | INTRAVENOUS | Status: DC
  Administered 2018-06-15 – 2018-06-16 (×2): 2 g via INTRAVENOUS
  Filled 2018-06-15 (×3): qty 20
  Filled 2018-06-15: qty 2
  Filled 2018-06-15: qty 20

## 2018-06-15 NOTE — NC FL2 (Signed)
[place] MEDICAID FL2 LEVEL OF CARE SCREENING TOOL     IDENTIFICATION  Patient Name: Casey [name] Birthdate: 1946-02-22 Sex: male Admission Date (Current Location): 06/13/2018  [facility] and [place] Number:  [employer] and Address:  [facility] [address] [place]      Provider Number: [id number]  Attending Physician Name and Address:  Barton Dubois, MD  Relative Name and Phone Number:       Current Level of Care: Hospital Recommended Level of Care: [place] Prior Approval Number:    Date Approved/Denied:   PASRR Number: [id number]  Discharge Plan: SNF    Current Diagnoses: Patient Active Problem List   Diagnosis Date Noted  . Atrial fibrillation with RVR ([place])   . Sepsis ([place]) 06/13/2018  . Acute metabolic encephalopathy 38/18/2993  . Thrombocytopenia ([place]) 06/13/2018  . Renal insufficiency 06/13/2018  . Anxiety 06/12/2018  . Bipolar disorder ([place]) 06/12/2018  . Chronic anticoagulation 05/05/2017  . Sleep apnea 05/05/2017  . Edema leg 05/05/2017  . Dysphasia 02/07/2017  . Mild obesity 05/10/2016  . History of colonic polyps   . Diverticulosis of colon without hemorrhage   . Hyperlipidemia LDL goal <130 05/16/2013  . KNEE PAIN 08/07/2009  . KNEE SPRAIN, ACUTE 08/07/2009  . COLONIC POLYPS, HX OF 05/10/2009  . Hypothyroidism 05/08/2009  . History of bipolar disorder 05/08/2009  . Essential hypertension 05/08/2009  . Permanent atrial fibrillation 05/08/2009  . HEMATOCHEZIA 05/08/2009  . LOW BACK PAIN, CHRONIC 05/08/2009  . ABDOMINAL PAIN 05/08/2009    Orientation RESPIRATION BLADDER Height & Weight     Self, Time, Situation, Place  Normal Continent Weight: 103.8 kg Height:  5' 11.5" (181.6 cm)  BEHAVIORAL SYMPTOMS/MOOD NEUROLOGICAL BOWEL NUTRITION STATUS  (None) (None) Continent Diet(Heart Healthy)  AMBULATORY STATUS COMMUNICATION OF NEEDS Skin   Extensive Assist Verbally Normal                        Personal Care Assistance Level of Assistance  Bathing, Feeding, Dressing Bathing Assistance: Limited assistance Feeding assistance: Independent Dressing Assistance: Limited assistance     Functional Limitations Info  Sight, Hearing, Speech Sight Info: Adequate Hearing Info: Adequate Speech Info: Adequate    SPECIAL CARE FACTORS FREQUENCY  PT (By licensed PT)     PT Frequency: 5X/W              Contractures Contractures Info: Not present    Additional Factors Info  Code Status, Allergies Code Status Info: Full Allergies Info: Amlodipine, Vicodin           Current Medications (06/15/2018):  This is the current hospital active medication list Current Facility-Administered Medications  Medication Dose Route Frequency Provider Last Rate Last Dose  . acetaminophen (TYLENOL) tablet 650 mg  650 mg Oral Q6H PRN Opyd, Ilene Qua, MD   650 mg at 06/15/18 0300   Or  . acetaminophen (TYLENOL) suppository 650 mg  650 mg Rectal Q6H PRN Opyd, Ilene Qua, MD      . albuterol (PROVENTIL) (2.5 MG/3ML) 0.083% nebulizer solution 3 mL  3 mL Inhalation Q6H PRN Opyd, Ilene Qua, MD      . ALPRAZolam Duanne Moron) tablet 0.5 mg  0.5 mg Oral Q12H PRN Barton Dubois, MD      . cefTRIAXone (ROCEPHIN) 2 g in sodium chloride 0.9 % 100 mL IVPB  2 g Intravenous Q24H Barton Dubois, MD      . levothyroxine (SYNTHROID, LEVOTHROID) tablet  25 mcg  25 mcg Oral Q0600 Vianne Bulls, MD   25 mcg at 06/15/18 0515  . modafinil (PROVIGIL) tablet 100 mg  100 mg Oral Daily Opyd, Ilene Qua, MD   100 mg at 06/15/18 0934  . OLANZapine (ZYPREXA) tablet 10 mg  10 mg Oral Daily Opyd, Ilene Qua, MD   10 mg at 06/15/18 0933  . ondansetron (ZOFRAN) tablet 4 mg  4 mg Oral Q6H PRN Opyd, Ilene Qua, MD       Or  . ondansetron (ZOFRAN) injection 4 mg  4 mg Intravenous Q6H PRN Opyd, Ilene Qua, MD      . Oxcarbazepine (TRILEPTAL) tablet 600 mg  600 mg Oral TID Vianne Bulls, MD   600 mg at 06/15/18 0933  .  pantoprazole (PROTONIX) EC tablet 40 mg  40 mg Oral Daily Opyd, Ilene Qua, MD   40 mg at 06/15/18 0933  . QUEtiapine (SEROQUEL) tablet 300 mg  300 mg Oral QHS Opyd, Ilene Qua, MD   300 mg at 06/14/18 2036  . rivaroxaban (XARELTO) tablet 20 mg  20 mg Oral QPC supper Opyd, Ilene Qua, MD   20 mg at 06/14/18 1732  . senna-docusate (Senokot-S) tablet 1 tablet  1 tablet Oral QHS PRN Opyd, Ilene Qua, MD   1 tablet at 06/14/18 1844  . sodium chloride flush (NS) 0.9 % injection 3 mL  3 mL Intravenous Q12H Opyd, Ilene Qua, MD   3 mL at 06/15/18 2094     Discharge Medications: Please see discharge summary for a list of discharge medications.  Relevant Imaging Results:  Relevant Lab Results:   Additional Information Franquez [place], [place]

## 2018-06-15 NOTE — Telephone Encounter (Signed)
Noted thanks °

## 2018-06-15 NOTE — Progress Notes (Signed)
PHARMACY - PHYSICIAN COMMUNICATION CRITICAL VALUE ALERT - BLOOD CULTURE IDENTIFICATION (BCID)  Casey [name] is an 72 y.o. male who presented to [facility] on 06/13/2018 with a chief complaint of fever, dysuria, confusion, and falls  Assessment: Patient significantly weak and tired. Work up suggesting sepsis in setting of UTI.  [place] + 1 of 2 with CONS, likely contaminant. Temp up to 103 this AM. Urine cx with insignificant growth but collected a couple hours after ceftriaxone 2gm IV given in ED.   Name of physician (or Provider) Contacted: Dr. Dyann Kief  Current antibiotics: Ceftriaxone  Changes to prescribed antibiotics recommended:  D/W MD and he will increase Ceftriaxone 2gm IV q24h F/U cxs and clinical progress  Results for orders placed or performed during the hospital encounter of 06/13/18  Blood Culture ID Panel (Reflexed) (Collected: 06/13/2018  9:19 PM)  Result Value Ref Range   Enterococcus species NOT DETECTED NOT DETECTED   Listeria monocytogenes NOT DETECTED NOT DETECTED   Staphylococcus species DETECTED (A) NOT DETECTED   Staphylococcus aureus (BCID) NOT DETECTED NOT DETECTED   Methicillin resistance DETECTED (A) NOT DETECTED   Streptococcus species NOT DETECTED NOT DETECTED   Streptococcus agalactiae NOT DETECTED NOT DETECTED   Streptococcus pneumoniae NOT DETECTED NOT DETECTED   Streptococcus pyogenes NOT DETECTED NOT DETECTED   Acinetobacter baumannii NOT DETECTED NOT DETECTED   Enterobacteriaceae species NOT DETECTED NOT DETECTED   Enterobacter cloacae complex NOT DETECTED NOT DETECTED   Escherichia coli NOT DETECTED NOT DETECTED   Klebsiella oxytoca NOT DETECTED NOT DETECTED   Klebsiella pneumoniae NOT DETECTED NOT DETECTED   Proteus species NOT DETECTED NOT DETECTED   Serratia marcescens NOT DETECTED NOT DETECTED   Haemophilus influenzae NOT DETECTED NOT DETECTED   Neisseria meningitidis NOT DETECTED NOT DETECTED   Pseudomonas aeruginosa NOT DETECTED NOT  DETECTED   Candida albicans NOT DETECTED NOT DETECTED   Candida glabrata NOT DETECTED NOT DETECTED   Candida krusei NOT DETECTED NOT DETECTED   Candida parapsilosis NOT DETECTED NOT DETECTED   Candida tropicalis NOT DETECTED NOT DETECTED    Isac Sarna, BS [place]ena [place], BCPS Clinical Pharmacist Pager [phone] 06/15/2018  9:01 AM

## 2018-06-15 NOTE — Plan of Care (Signed)
  Problem: Acute Rehab PT Goals(only PT should resolve) Goal: Pt Will Go Supine/Side To Sit Flowsheets (Taken 06/15/2018 1213) Pt will go Supine/Side to Sit: with modified independence Goal: Patient Will Transfer Sit To/From Stand Flowsheets (Taken 06/15/2018 1213) Patient will transfer sit to/from stand: with min guard assist Goal: Pt Will Transfer Bed To Chair/Chair To Bed Flowsheets (Taken 06/15/2018 1213) Pt will Transfer Bed to Chair/Chair to Bed: min guard assist Goal: Pt Will Ambulate Flowsheets (Taken 06/15/2018 1213) Pt will Ambulate: 75 feet; with min guard assist; with rolling walker   12:14 PM, 06/15/18 Lonell Grandchild, MPT Physical Therapist with [facility] [phone] office [phone] mobile phone

## 2018-06-15 NOTE — Clinical Social Work Placement (Signed)
   CLINICAL SOCIAL WORK PLACEMENT  NOTE  Date:  06/15/2018  Patient Details  Name: JADYN BARGE MRN: [id number] Date of Birth: [birth year]  Clinical Social Work is seeking post-discharge placement for this patient at the [place] level of care (*CSW will initial, date and re-position this form in  chart as items are completed):  Yes   Patient/family provided with [facility] Work Department's list of facilities offering this level of care within the geographic area requested by the patient (or if unable, by the patient's family).  Yes   Patient/family informed of their freedom to choose among providers that offer the needed level of care, that participate in Medicare, Medicaid or managed care program needed by the patient, have an available bed and are willing to accept the patient.  Yes   Patient/family informed of [facility]'s ownership interest in [facility] and [facility], as well as of the fact that they are under no obligation to receive care at these facilities.  PASRR submitted to EDS on 06/15/18     PASRR number received on 06/15/18     Existing PASRR number confirmed on       FL2 transmitted to all facilities in geographic area requested by pt/family on 06/15/18     FL2 transmitted to all facilities within larger geographic area on       Patient informed that his/her managed care company has contracts with or will negotiate with certain facilities, including the following:            Patient/family informed of bed offers received.  Patient chooses bed at       Physician recommends and patient chooses bed at      Patient to be transferred to   on  .  Patient to be transferred to facility by       Patient family notified on   of transfer.  Name of family member notified:        PHYSICIAN       Additional Comment: Spoke to patient with wife present.  Agreeable to referral.  They would prefer pt stay in [place], have  identified [facility] #1 and [facility] #2.   _______________________________________________ Trish Mage, LCSW 06/15/2018, 11:32 AM

## 2018-06-15 NOTE — Progress Notes (Signed)
PROGRESS NOTE    Casey [name]  ZOX:[id number] DOB: 1946-04-08 DOA: 06/13/2018 PCP: Sharilyn Sites, MD   Brief Narrative:  Mr. Ungaro is a 72 y/o male with PMH for bipolar disorder, atrial fibrillation on Xarelto, OSA, hypothyroidism, and COPD, presented to the ED with fevers, hallucinations, chills, confusion, dysuria, and falls. The wife expresses that they were sitting on the sofa and the patient started hallucinating, after this she took her temperature and found that it was at 104 F. She proceeded to call EMS and the patient was brought to the hospital.   Assessment & Plan: 1. Sepsis, suspected secondary to UTI -Patient continues to spike fevers -Urine culture collected, waiting on results -Blood cultures reporting growth of gram positive cocci; MRSA CNS (possibly contaminant)  -Will continue ceftriaxone. -Will continue to monitor VS and continue PRN antipyretics.   2. Acute Kidney Injury -Significantly improved after fluid resuscitation -Will continue to monitor trend intermittently  -CR back to normal.   3. Acute encephalopathy -Family reported recent episodes of confusion and hallucinations that seemed to have resolved after fluid resuscitation and antipyretics -CT Head negative for acute intracranial abnormalities -Continue to treat infection with IV antibiotics -Continue supportive care  4. Atrial fibrillation -Rate has normalized with IV fluids and antipyretics -CHADS-VASc of 2 -Continue Xarelto  5. Thrombocytopenia -Platelets at 100,000 -Likely secondary to infection -Repeat CBC in am  6. Physical deterioration -PT has seen the patient and recommended SNF -family in agreement.  7. Bipolar disorder -Patient states that he has been battling with depression for the past year. Following with behavioral health with adjustment of medications -Denies suicide ideation or homicidal ideation -Patient was having hallucinations in the setting of acute febrile  illness -Continue Zyprexa, Trileptal, and Seroquel  8. Hypothyroidism -Will continue Synthroid  9. Obstructive Sleep Apnea -continue CPAP at bedtime -Continue modafinil  10. Obesity class 1 -Body mass index is 31.47 kg/m.  -Patient was counseled on the importance of weight loss, and the possible side effects the his over weight can have on his overall health.  11. COPD -no wheezing and no SOB -continue PRN albuterol  DVT prophylaxis: Xarelto Code Status: Full code Family Communication: Wife at bedside Disposition Plan: Continue IV antibiotics and antipyretics. Patient is not ready for discharge and will need SNF.    Consultants:   None  Procedures:   See below for X ray results   Antimicrobials:  Anti-infectives (From admission, onward)   Start     Dose/Rate Route Frequency Ordered Stop   06/15/18 1100  cefTRIAXone (ROCEPHIN) 2 g in sodium chloride 0.9 % 100 mL IVPB     2 g 200 mL/hr over 30 Minutes Intravenous Every 24 hours 06/15/18 0856     06/14/18 2115  cefTRIAXone (ROCEPHIN) 1 g in sodium chloride 0.9 % 100 mL IVPB  Status:  Discontinued     1 g 200 mL/hr over 30 Minutes Intravenous Every 24 hours 06/14/18 0018 06/15/18 0810   06/13/18 2115  cefTRIAXone (ROCEPHIN) 2 g in sodium chloride 0.9 % 100 mL IVPB  Status:  Discontinued     2 g 200 mL/hr over 30 Minutes Intravenous Every 24 hours 06/13/18 2106 06/14/18 0018   06/13/18 2115  azithromycin (ZITHROMAX) 500 mg in sodium chloride 0.9 % 250 mL IVPB  Status:  Discontinued     500 mg 250 mL/hr over 60 Minutes Intravenous Every 24 hours 06/13/18 2106 06/14/18 0018      Subjective: Patient spiking fevers. But overall expressing overall  improvements on his symptoms. Patient denies chest pain, shortness of breath, nausea or vomiting.   Objective: Vitals:   06/14/18 2147 06/15/18 0303 06/15/18 0357 06/15/18 0436  BP: (!) 117/56 (!) 155/76 135/69 124/72  Pulse: 66 95 92 96  Resp: 20 (!) 23  (!) 21  Temp: 98.7  F (37.1 C) (!) 103 F (39.4 C) (!) 103 F (39.4 C) 99.6 F (37.6 C)  TempSrc: Oral Oral Oral Oral  SpO2: 98% 95% 94% 95%  Weight:      Height:        Intake/Output Summary (Last 24 hours) at 06/15/2018 1042 Last data filed at 06/15/2018 0900 Gross per 24 hour  Intake 720 ml  Output 400 ml  Net 320 ml   Filed Weights   06/13/18 2104 06/14/18 0100  Weight: 102.9 kg 103.8 kg   Examination: General exam: Alert, awake, oriented x 3. Patient answering questions while sitting on bedside recliner. Patient continues to spike fevers. Respiratory system: Clear to auscultation. Respiratory effort normal. Cardiovascular system:Irregular rhythm and controlled rate. No murmurs or rubs; no JVD Gastrointestinal system: Abdomen is nondistended, soft and nontender. Felt what the patient states is a hernia in the left middle quadrant. No organomegaly felt. Normal bowel sounds heard. Central nervous system: Alert and oriented. No focal neurological deficits. Extremities: No C/C/E, +pedal pulses Skin: Scattered abrasions and ecchymoses on the right side of the face and right arm.  Psychiatry: Judgement and insight appear normal. Mood & affect appropriate.   Data Reviewed: I have personally reviewed following labs and imaging studies  CBC: Recent Labs  Lab 06/13/18 2114 06/14/18 0511 06/15/18 0532  WBC 8.6 8.6 5.6  NEUTROABS 7.6 7.2  --   HGB 13.7 12.0* 12.2*  HCT 41.5 36.9* 37.3*  MCV 98.6 100.0 98.9  PLT 113* 105* 595*   Basic Metabolic Panel: Recent Labs  Lab 06/13/18 2114 06/14/18 0511 06/15/18 0532  NA 140 140 140  K 3.8 3.9 3.5  CL 110 112* 110  CO2 22 22 24   GLUCOSE 146* 159* 125*  BUN 31* 29* 21  CREATININE 1.54* 1.30* 1.12  CALCIUM 8.8* 7.7* 8.0*   GFR: Estimated Creatinine Clearance: 75.9 mL/min (by C-G formula based on SCr of 1.12 mg/dL).   Liver Function Tests: Recent Labs  Lab 06/13/18 2114  AST 38  ALT 28  ALKPHOS 73  BILITOT 0.9  PROT 6.9  ALBUMIN 3.6    Cardiac Enzymes: Recent Labs  Lab 06/13/18 2114  TROPONINI <0.03   Urine analysis:    Component Value Date/Time   COLORURINE AMBER (A) 06/13/2018 2333   APPEARANCEUR CLOUDY (A) 06/13/2018 2333   LABSPEC 1.028 06/13/2018 2333   PHURINE 5.0 06/13/2018 2333   GLUCOSEU NEGATIVE 06/13/2018 2333   HGBUR MODERATE (A) 06/13/2018 2333   BILIRUBINUR NEGATIVE 06/13/2018 2333   [place] 06/13/2018 2333   PROTEINUR 100 (A) 06/13/2018 2333   NITRITE NEGATIVE 06/13/2018 2333   LEUKOCYTESUR MODERATE (A) 06/13/2018 2333    Recent Results (from the past 240 hour(s))  Blood Culture (routine x 2)     Status: None (Preliminary result)   Collection Time: 06/13/18  9:14 PM  Result Value Ref Range Status   Specimen Description BLOOD RIGHT HAND  Final   Special Requests   Final    BOTTLES DRAWN AEROBIC AND ANAEROBIC Blood Culture adequate volume   Culture   Final    NO GROWTH 2 DAYS Performed at [facility], [address] [place], [place] [address]  Report Status PENDING  Incomplete  Blood Culture (routine x 2)     Status: None (Preliminary result)   Collection Time: 06/13/18  9:19 PM  Result Value Ref Range Status   Specimen Description   Final    BLOOD RIGHT ARM Performed at [facility], [address] [place], [place] [address]    Special Requests   Final    BOTTLES DRAWN AEROBIC AND ANAEROBIC Blood Culture adequate volume Performed at [place] [place], [place] [address]    Culture  Setup Time   Final    GRAM POSITIVE COCCI Gram Stain Report Called to,Read Back By and Verified With: FOLEY,B. AT 1535 ON 06/14/2018 BY EVA IN BOTH AEROBIC AND ANAEROBIC BOTTLES CRITICAL RESULT CALLED TO, READ BACK BY AND VERIFIED WITHGeraldo Docker RN [phone] 1856 BY GF Performed at [facility], [place] [address], [place], [place] [address]    Culture GRAM POSITIVE COCCI  Final   Report Status PENDING  Incomplete  Blood Culture ID Panel (Reflexed)     Status: Abnormal    Collection Time: 06/13/18  9:19 PM  Result Value Ref Range Status   Enterococcus species NOT DETECTED NOT DETECTED Final   Listeria monocytogenes NOT DETECTED NOT DETECTED Final   Staphylococcus species DETECTED (A) NOT DETECTED Final    Comment: Methicillin (oxacillin) resistant coagulase negative staphylococcus. Possible blood culture contaminant (unless isolated from more than one blood culture draw or clinical case suggests pathogenicity). No antibiotic treatment is indicated for blood  culture contaminants. CRITICAL RESULT CALLED TO, READ BACK BY AND VERIFIED WITH: B FOLLEY RN [phone] 1856 BY GF    Staphylococcus aureus (BCID) NOT DETECTED NOT DETECTED Final   Methicillin resistance DETECTED (A) NOT DETECTED Final    Comment: CRITICAL RESULT CALLED TO, READ BACK BY AND VERIFIED WITH: B FOLLEY RN [phone] 1856 BY GF    Streptococcus species NOT DETECTED NOT DETECTED Final   Streptococcus agalactiae NOT DETECTED NOT DETECTED Final   Streptococcus pneumoniae NOT DETECTED NOT DETECTED Final   Streptococcus pyogenes NOT DETECTED NOT DETECTED Final   Acinetobacter baumannii NOT DETECTED NOT DETECTED Final   Enterobacteriaceae species NOT DETECTED NOT DETECTED Final   Enterobacter cloacae complex NOT DETECTED NOT DETECTED Final   Escherichia coli NOT DETECTED NOT DETECTED Final   Klebsiella oxytoca NOT DETECTED NOT DETECTED Final   Klebsiella pneumoniae NOT DETECTED NOT DETECTED Final   Proteus species NOT DETECTED NOT DETECTED Final   Serratia marcescens NOT DETECTED NOT DETECTED Final   Haemophilus influenzae NOT DETECTED NOT DETECTED Final   Neisseria meningitidis NOT DETECTED NOT DETECTED Final   Pseudomonas aeruginosa NOT DETECTED NOT DETECTED Final   Candida albicans NOT DETECTED NOT DETECTED Final   Candida glabrata NOT DETECTED NOT DETECTED Final   Candida krusei NOT DETECTED NOT DETECTED Final   Candida parapsilosis NOT DETECTED NOT DETECTED Final   Candida tropicalis NOT  DETECTED NOT DETECTED Final    Comment: Performed at [facility], [place] [address], [place], [place] [address]  Urine culture     Status: Abnormal   Collection Time: 06/13/18 11:33 PM  Result Value Ref Range Status   Specimen Description   Final    URINE, RANDOM Performed at [facility], [address] [place], [place] [address]    Special Requests   Final    NONE Performed at [facility], [address] [place], [place] [address]    Culture (A)  Final    <  10,000 COLONIES/mL INSIGNIFICANT GROWTH Performed at [facility], [place] [address], [place], [place] [address]    Report Status 06/15/2018 FINAL  Final     Radiology Studies: Dg Chest 2 View  Result Date: 06/13/2018 CLINICAL DATA:  Shortness of breath. Fever. EXAM: CHEST - 2 VIEW COMPARISON:  Chest and rib radiographs 04/25/2016 FINDINGS: Chronic hyperinflation and bronchial thickening.The cardiomediastinal contours are normal. Chronic scarring at the left lung base. Pulmonary vasculature is normal. No consolidation, pleural effusion, or pneumothorax. No acute osseous abnormalities are seen. IMPRESSION: Chronic hyperinflation and bronchial thickening. Scarring at the left lung base. No acute findings. Electronically Signed   By: Keith Rake M.D.   On: 06/13/2018 23:05   Dg Lumbar Spine Complete  Result Date: 06/14/2018 CLINICAL DATA:  Initial evaluation for acute trauma, fall. EXAM: LUMBAR SPINE - COMPLETE 4+ VIEW COMPARISON:  Prior MRI from 05/23/2016. FINDINGS: Vertebral bodies normally aligned with preservation of the normal lumbar lordosis. No listhesis or malalignment. Vertebral body heights maintained without evidence for acute or chronic fracture. Visualized sacrum and pelvis intact. No discrete osseous lesions. Moderate to advanced facet arthropathy throughout the lumbar spine, most notable at L5-S1. Mild degenerative intervertebral disc space narrowing at L2-3 through L5-S1. Prominent bridging osteophytic  spurring noted throughout the lumbar spine. Visualized soft tissues within normal limits. IMPRESSION: 1. No radiographic evidence for acute abnormality within the lumbar spine. 2. Moderate to advanced facet arthropathy throughout the lumbar spine, most notable at L5-S1. Electronically Signed   By: Jeannine Boga M.D.   On: 06/14/2018 00:26   Dg Pelvis 1-2 Views  Result Date: 06/13/2018 CLINICAL DATA:  Post fall. EXAM: PELVIS - 1-2 VIEW COMPARISON:  None. FINDINGS: The cortical margins of the bony pelvis are intact. No fracture. Pubic symphysis and sacroiliac joints are congruent. Both femoral heads are well-seated in the respective acetabula. Bilateral acetabular spurring consistent with mild degenerative change. Enthesopathy at the right hamstring insertion. IMPRESSION: No evidence of pelvic fracture. Electronically Signed   By: Keith Rake M.D.   On: 06/13/2018 23:07   Dg Elbow Complete Right  Result Date: 06/13/2018 CLINICAL DATA:  Right elbow pain.  Fall. EXAM: RIGHT ELBOW - COMPLETE 3+ VIEW COMPARISON:  None. FINDINGS: Lateral view limited by positioning. There is no evidence of fracture, dislocation, or joint effusion. Mild degenerative changes the radiocarpal and ulnar trochlear joints. Enthesopathic changes about the humeral epicondyles. Soft tissues are unremarkable. IMPRESSION: Degenerative change without acute fracture or dislocation of the elbow. Electronically Signed   By: Keith Rake M.D.   On: 06/13/2018 23:08   Ct Head Wo Contrast  Result Date: 06/13/2018 CLINICAL DATA:  Per ED notes: Pt arrived to er by ems after being called out for fever, altered mental status, pt reports that he has had several falls since Thursday and has not felt good since then, fever with ems was 104.0, pt given 1000 mg tylenol po by ems enroute to er along with 500cc NS bolus, pt has abrasion noted to rigth temporal area and right elbow area. EXAM: CT HEAD WITHOUT CONTRAST TECHNIQUE: Contiguous  axial images were obtained from the base of the skull through the vertex without intravenous contrast. COMPARISON:  02/06/2017 FINDINGS: Brain: No evidence of acute infarction, hemorrhage, hydrocephalus, extra-axial collection or mass lesion/mass effect. There is ventricular and sulcal enlargement reflecting mild atrophy. There is periventricular white matter hypoattenuation consistent with mild chronic microvascular ischemic change. These findings are stable. Vascular: No hyperdense vessel or unexpected calcification. Skull: Normal. Negative  for fracture or focal lesion. Sinuses/Orbits: Globes and orbits are unremarkable. The sinuses and mastoid air cells are clear Other: None. IMPRESSION: 1. No acute intracranial abnormalities. 2. Mild atrophy and chronic microvascular ischemic change. Electronically Signed   By: Lajean Manes M.D.   On: 06/13/2018 23:08    Scheduled Meds: . levothyroxine  25 mcg Oral Q0600  . modafinil  100 mg Oral Daily  . OLANZapine  10 mg Oral Daily  . oxcarbazepine  600 mg Oral TID  . pantoprazole  40 mg Oral Daily  . QUEtiapine  300 mg Oral QHS  . rivaroxaban  20 mg Oral QPC supper  . sodium chloride flush  3 mL Intravenous Q12H   Continuous Infusions: . cefTRIAXone (ROCEPHIN)  IV       LOS: 2 days    Time spent: 25 minutes   Barton Dubois, MD [facility] Pager [phone]  If 7PM-7AM, please contact night-coverage [email or web address] Password [email or web address] 06/15/2018, 10:42 AM

## 2018-06-15 NOTE — Evaluation (Signed)
Physical Therapy Evaluation Patient Details Name: Casey [name] MRN: [id number] DOB: 12/10/1945 Today's Date: 06/15/2018   History of Present Illness  Casey [name] is a 72 y.o. male with medical history significant for bipolar disorder, atrial fibrillation on Xarelto, OSA, hypothyroidism, and COPD, now presenting to the emergency department for evaluation of fevers, confusion, hallucinations, dysuria, and falls.  Patient is accompanied by his wife and daughter who assist with the history.  He has had waxing and waning confusion with hallucinations and off-balance gait for the past 3 days, was found to be febrile, and he complains of some mild dysuria but denies flank pain.  He has some superficial abrasions from his recent falls, denies losing consciousness, but reports some pain at the elbow, hips, and low back.  He denies rhinorrhea, sore throat, shortness of breath, or cough.  Denies abdominal pain, nausea, or vomiting.  Reports some recent constipation that he treated effectively with stool softener at home.  Denies any suicidal or homicidal ideation, but reports that his depression has been more severe for about a year now, following with behavioral health for adjustment in his medications.  No sick contacts.     Clinical Impression  Patient had to use bed rail with head slightly raised for sitting up at bedside, tends drop into chair during stand to sits increasing risk for falls, demonstrates slow labored cadence with leaning over RW once fatigue and tolerated sitting up in chair after therapy.  Patient will benefit from continued physical therapy in hospital and recommended venue below to increase strength, balance, endurance for safe ADLs and gait.    Follow Up Recommendations SNF    Equipment Recommendations  Rolling walker with 5" wheels    Recommendations for Other Services       Precautions / Restrictions Precautions Precautions: Fall Restrictions Weight Bearing Restrictions:  No      Mobility  Bed Mobility Overal bed mobility: Needs Assistance Bed Mobility: Supine to Sit     Supine to sit: Min guard        Transfers Overall transfer level: Needs assistance Equipment used: Rolling walker (2 wheeled) Transfers: Sit to/from [facility] Sit to Stand: Min assist Stand pivot transfers: Min assist       General transfer comment: slow labored movement  Ambulation/Gait Ambulation/Gait assistance: Min assist Gait Distance (Feet): 35 Feet   Gait Pattern/deviations: Decreased step length - right;Decreased step length - left;Decreased stride length Gait velocity: decreased   General Gait Details: slow labored cadence with much leaning on RW for support due to BLE weakness, limited secondary to c/o fatigue  Stairs            Wheelchair Mobility    Modified Rankin (Stroke Patients Only)       Balance Overall balance assessment: Needs assistance Sitting-balance support: Feet supported;No upper extremity supported Sitting balance-Leahy Scale: Good     Standing balance support: During functional activity;No upper extremity supported Standing balance-Leahy Scale: Poor Standing balance comment: fair/poor without AD, fair using RW                             Pertinent Vitals/Pain Pain Assessment: No/denies pain    Home Living Family/patient expects to be discharged to:: Private residence Living Arrangements: Spouse/significant other Available Help at Discharge: Family Type of Home: House Home Access: Stairs to enter Entrance Stairs-Rails: None Entrance Stairs-Number of Steps: 3 Home Layout: Two level;Able to live on main level  with bedroom/bathroom Home Equipment: Cane - single point      Prior Function                 Hand Dominance        Extremity/Trunk Assessment   Upper Extremity Assessment Upper Extremity Assessment: Generalized weakness    Lower Extremity Assessment Lower  Extremity Assessment: Generalized weakness    Cervical / Trunk Assessment Cervical / Trunk Assessment: Normal  Communication      Cognition Arousal/Alertness: Awake/alert Behavior During Therapy: WFL for tasks assessed/performed Overall Cognitive Status: Within Functional Limits for tasks assessed                                        General Comments      Exercises     Assessment/Plan    PT Assessment Patient needs continued PT services  PT Problem List Decreased strength;Decreased activity tolerance;Decreased balance;Decreased mobility       PT Treatment Interventions Gait training;Stair training;Functional mobility training;Therapeutic activities;Therapeutic exercise;Patient/family education    PT Goals (Current goals can be found in the Care Plan section)  Acute Rehab PT Goals Patient Stated Goal: return [facility] at [facility] after rehab PT Goal Formulation: With patient Time For Goal Achievement: 06/29/18 Potential to Achieve Goals: Good    Frequency Min 3X/week   Barriers to discharge        Co-evaluation               AM-PAC PT "6 Clicks" Mobility  Outcome Measure Help needed turning from your back to your side while in a flat bed without using bedrails?: None Help needed moving from lying on your back to sitting on the side of a flat bed without using bedrails?: A Little Help needed moving to and from a bed to a chair (including a wheelchair)?: Total Help needed standing up from a chair using your arms (e.g., wheelchair or bedside chair)?: A Little Help needed to walk in hospital room?: A Little Help needed climbing 3-5 steps with a railing? : A Lot 6 Click Score: 16    End of Session   Activity Tolerance: Patient tolerated treatment well;Patient limited by fatigue Patient left: in chair;with call bell/phone within reach Nurse Communication: Mobility status PT Visit Diagnosis: Unsteadiness on feet (R26.81);Other abnormalities of gait and  mobility (R26.89);Muscle weakness (generalized) (M62.81)    Time: 8850-2774 PT Time Calculation (min) (ACUTE ONLY): 21 min   Charges:   PT Evaluation $PT Eval Moderate Complexity: 1 Mod PT Treatments $Therapeutic Activity: 23-37 mins        12:12 PM, 06/15/18 Lonell Grandchild, MPT Physical Therapist with [facility] [phone] office [phone] mobile phone

## 2018-06-15 NOTE — Telephone Encounter (Signed)
Patient's wife called to leave information for you and find out if you needed to change any of his meds.  Patient was admitted to [facility] Saturday night with temp. Of 104.3 , severe UTI and sepsis. Not sure how long he will be there

## 2018-06-16 LAB — CULTURE, BLOOD (ROUTINE X 2): Special Requests: ADEQUATE

## 2018-06-16 LAB — 10-HYDROXYCARBAZEPINE: Triliptal/MTB(Oxcarbazepin): 50 ug/mL — ABNORMAL HIGH (ref 10–35)

## 2018-06-16 MED ORDER — SODIUM CHLORIDE 0.9 % IV SOLN
INTRAVENOUS | Status: DC | PRN
  Administered 2018-06-16: 250 mL via INTRAVENOUS

## 2018-06-16 MED ORDER — POLYETHYLENE GLYCOL 3350 17 G PO PACK
17.0000 g | PACK | Freq: Every day | ORAL | Status: DC
  Administered 2018-06-16 – 2018-06-17 (×2): 17 g via ORAL
  Filled 2018-06-16 (×2): qty 1

## 2018-06-16 MED ORDER — DOCUSATE SODIUM 100 MG PO CAPS
100.0000 mg | ORAL_CAPSULE | Freq: Two times a day (BID) | ORAL | Status: DC
  Administered 2018-06-16 – 2018-06-17 (×2): 100 mg via ORAL
  Filled 2018-06-16 (×2): qty 1

## 2018-06-16 NOTE — Progress Notes (Signed)
CPAP unit pulled out of patient room because patient keeps refusing. Circuit and mask left at bedside.

## 2018-06-16 NOTE — Progress Notes (Signed)
PROGRESS NOTE    Casey [name]  YDX:[id number] DOB: 1945/09/23 DOA: 06/13/2018 PCP: Casey Sites, MD   Brief Narrative:  Casey [name] is a 72 y/o male with PMH for bipolar disorder, atrial fibrillation on Xarelto, OSA, hypothyroidism, and COPD, presented to the ED with fevers, hallucinations, chills, confusion, dysuria, and falls. The wife expresses that they were sitting on the sofa and the patient started hallucinating, after this she took her temperature and found that it was at 104 F. She proceeded to call EMS and the patient was brought to the hospital.   Assessment & Plan: 1. Sepsis, suspected secondary to UTI -Patient did not have fevers overnight  -Urine culture collected; <10,000 colonies/mL, insignificant growth (per record reviewed cultures were taken after antibiotics initiated in ED). -Blood cultures reporting growth of gram positive cocci; MRSA CNS (possibly contaminant)  -Will continue treatment with ceftriaxone. -Will continue to monitor VS and continue PRN antipyretics.    2. Acute Kidney Injury -Significantly improved after fluid resuscitation -Will continue to monitor trend intermittently   -Cr back to normal   3. Acute encephalopathy -Family reported recent episodes of confusion and hallucinations that seemed to have resolved after fluid resuscitation and antipyretics -CT Head negative for acute intracranial abnormalities -Continue to treat infection with IV antibiotics -Continue supportive care  4. Atrial fibrillation -Rate has normalized with IV fluids and antipyretics -CHADS-VASc of 2 -Continue Xarelto  5. Thrombocytopenia -Platelets in the 100,000 range -Likely secondary to infection -Repeat CBC in am to follow platelets count -No signs of overt bleeding.  6. Physical deterioration -PT has seen the patient and recommended SNF -family in agreement.  7. Bipolar disorder -Patient states that he has been battling with depression for the past year.  Following with behavioral health with adjustment of medications -Denies suicide ideation or homicidal ideation -Patient was having hallucinations in the setting of acute febrile illness -Will continue Zyprexa, Trileptal, and Seroquel.   8. Hypothyroidism -Will continue Synthroid  9. Obstructive Sleep Apnea -Continue CPAP at bedtime -Continue modafinil  10. Obesity class 1 -Body mass index is 31.47 kg/m.  -Patient was counseled on the importance of weight loss, and the possible side effects the his over weight can have on his overall health.  11. COPD -No wheezing and no SOB -Continue PRN albuterol  12.Constipation -Will start Miralax and Colace -Possibly due to lack of mobility   DVT prophylaxis: Xarelto Code Status: Full code Family Communication: Wife at bedside Disposition Plan: Continue IV antibiotics and antipyretics. Transition to oral antibiotics once afebrile for another 24 hours without fever. Patient is not ready for discharge yet and will need SNF.    Consultants:   None  Procedures:   See below for X ray results   Antimicrobials:  Anti-infectives (From admission, onward)   Start     Dose/Rate Route Frequency Ordered Stop   06/15/18 1100  cefTRIAXone (ROCEPHIN) 2 g in sodium chloride 0.9 % 100 mL IVPB     2 g 200 mL/hr over 30 Minutes Intravenous Every 24 hours 06/15/18 0856     06/14/18 2115  cefTRIAXone (ROCEPHIN) 1 g in sodium chloride 0.9 % 100 mL IVPB  Status:  Discontinued     1 g 200 mL/hr over 30 Minutes Intravenous Every 24 hours 06/14/18 0018 06/15/18 0810   06/13/18 2115  cefTRIAXone (ROCEPHIN) 2 g in sodium chloride 0.9 % 100 mL IVPB  Status:  Discontinued     2 g 200 mL/hr over 30 Minutes Intravenous Every 24  hours 06/13/18 2106 06/14/18 0018   06/13/18 2115  azithromycin (ZITHROMAX) 500 mg in sodium chloride 0.9 % 250 mL IVPB  Status:  Discontinued     500 mg 250 mL/hr over 60 Minutes Intravenous Every 24 hours 06/13/18 2106 06/14/18 0018        Subjective: Patient has not had any further fevers. Patient is expressing he has not had a bowel movement in several days, but overall expressing overall improvement on his symptoms. Denies chest pain, shortness of breath, nausea and vomiting.    Objective: Vitals:   06/15/18 2126 06/16/18 0026 06/16/18 0537 06/16/18 1434  BP: 136/69  (!) 155/92 (!) 171/91  Pulse: 86  72 89  Resp: 20   18  Temp: 100.3 F (37.9 C) 98.7 F (37.1 C) 99 F (37.2 C)   TempSrc: Oral Oral Oral   SpO2: 97%  95% 93%  Weight:      Height:        Intake/Output Summary (Last 24 hours) at 06/16/2018 1455 Last data filed at 06/16/2018 1041 Gross per 24 hour  Intake 480 ml  Output 1350 ml  Net -870 ml   Filed Weights   06/13/18 2104 06/14/18 0100  Weight: 102.9 kg 103.8 kg   Examination: General exam: Alert, awake, oriented x 3. Patient in no acute distress. No fevers, no chest pain, no shortness of breath. Respiratory system: Clear to auscultation. Respiratory effort normal. Cardiovascular system:Irregular rhythm and controlled rate. No murmurs or rubs;no JVD Gastrointestinal system: Abdomen is nondistended, soft and nontender. Felt what the patient states is a hernia in the left mid quadrant. No organomegaly felt. Normal bowel sounds heard. Central nervous system: Alert and oriented. No focal neurological deficits. Extremities: No cyanosis, no clubbing.  Skin: Scattered abrasions and ecchymoses on the right side of his face and right arm.  Psychiatry: Judgement and insight appear normal. Mood & affect appropriate.   Data Reviewed: I have personally reviewed following labs and imaging studies  CBC: Recent Labs  Lab 06/13/18 2114 06/14/18 0511 06/15/18 0532  WBC 8.6 8.6 5.6  NEUTROABS 7.6 7.2  --   HGB 13.7 12.0* 12.2*  HCT 41.5 36.9* 37.3*  MCV 98.6 100.0 98.9  PLT 113* 105* 562*   Basic Metabolic Panel: Recent Labs  Lab 06/13/18 2114 06/14/18 0511 06/15/18 0532  NA 140 140 140   K 3.8 3.9 3.5  CL 110 112* 110  CO2 22 22 24   GLUCOSE 146* 159* 125*  BUN 31* 29* 21  CREATININE 1.54* 1.30* 1.12  CALCIUM 8.8* 7.7* 8.0*   GFR: Estimated Creatinine Clearance: 75.9 mL/min (by C-G formula based on SCr of 1.12 mg/dL).   Liver Function Tests: Recent Labs  Lab 06/13/18 2114  AST 38  ALT 28  ALKPHOS 73  BILITOT 0.9  PROT 6.9  ALBUMIN 3.6   Cardiac Enzymes: Recent Labs  Lab 06/13/18 2114  TROPONINI <0.03   Urine analysis:    Component Value Date/Time   COLORURINE AMBER (A) 06/13/2018 2333   APPEARANCEUR CLOUDY (A) 06/13/2018 2333   LABSPEC 1.028 06/13/2018 2333   PHURINE 5.0 06/13/2018 2333   GLUCOSEU NEGATIVE 06/13/2018 2333   HGBUR MODERATE (A) 06/13/2018 2333   BILIRUBINUR NEGATIVE 06/13/2018 2333   [place] 06/13/2018 2333   PROTEINUR 100 (A) 06/13/2018 2333   NITRITE NEGATIVE 06/13/2018 2333   LEUKOCYTESUR MODERATE (A) 06/13/2018 2333    Recent Results (from the past 240 hour(s))  Blood Culture (routine x 2)     Status:  None (Preliminary result)   Collection Time: 06/13/18  9:14 PM  Result Value Ref Range Status   Specimen Description BLOOD RIGHT HAND  Final   Special Requests   Final    BOTTLES DRAWN AEROBIC AND ANAEROBIC Blood Culture adequate volume   Culture   Final    NO GROWTH 3 DAYS Performed at [facility], [address] [place], [place] [address]    Report Status PENDING  Incomplete  Blood Culture (routine x 2)     Status: Abnormal   Collection Time: 06/13/18  9:19 PM  Result Value Ref Range Status   Specimen Description   Final    BLOOD RIGHT ARM Performed at [facility], [address] [place], [place] [address]    Special Requests   Final    BOTTLES DRAWN AEROBIC AND ANAEROBIC Blood Culture adequate volume Performed at [place] [place], [place] [address]    Culture  Setup Time   Final    GRAM POSITIVE COCCI Gram Stain Report Called to,Read Back By and Verified With: FOLEY,B. AT [id number] ON  06/14/2018 BY EVA IN BOTH AEROBIC AND ANAEROBIC BOTTLES CRITICAL RESULT CALLED TO, READ BACK BY AND VERIFIED WITH: B FOLLEY RN [id number] 1856 BY GF    Culture (A)  Final    STAPHYLOCOCCUS SPECIES (COAGULASE NEGATIVE) THE SIGNIFICANCE OF ISOLATING THIS ORGANISM FROM A SINGLE SET OF BLOOD CULTURES WHEN MULTIPLE SETS ARE DRAWN IS UNCERTAIN. PLEASE NOTIFY THE MICROBIOLOGY DEPARTMENT WITHIN ONE WEEK IF SPECIATION AND SENSITIVITIES ARE REQUIRED. Performed at [facility], [place] [address] [place], [place] [address]    Report Status 06/16/2018 FINAL  Final  Blood Culture ID Panel (Reflexed)     Status: Abnormal   Collection Time: 06/13/18  9:19 PM  Result Value Ref Range Status   Enterococcus species NOT DETECTED NOT DETECTED Final   Listeria monocytogenes NOT DETECTED NOT DETECTED Final   Staphylococcus species DETECTED (A) NOT DETECTED Final    Comment: Methicillin (oxacillin) resistant coagulase negative staphylococcus. Possible blood culture contaminant (unless isolated from more than one blood culture draw or clinical case suggests pathogenicity). No antibiotic treatment is indicated for blood  culture contaminants. CRITICAL RESULT CALLED TO, READ BACK BY AND VERIFIED WITH: B FOLLEY RN [phone] 1856 BY GF    Staphylococcus aureus (BCID) NOT DETECTED NOT DETECTED Final   Methicillin resistance DETECTED (A) NOT DETECTED Final    Comment: CRITICAL RESULT CALLED TO, READ BACK BY AND VERIFIED WITH: B FOLLEY RN [phone] 1856 BY GF    Streptococcus species NOT DETECTED NOT DETECTED Final   Streptococcus agalactiae NOT DETECTED NOT DETECTED Final   Streptococcus pneumoniae NOT DETECTED NOT DETECTED Final   Streptococcus pyogenes NOT DETECTED NOT DETECTED Final   Acinetobacter baumannii NOT DETECTED NOT DETECTED Final   Enterobacteriaceae species NOT DETECTED NOT DETECTED Final   Enterobacter cloacae complex NOT DETECTED NOT DETECTED Final   Escherichia coli NOT DETECTED NOT DETECTED Final    Klebsiella oxytoca NOT DETECTED NOT DETECTED Final   Klebsiella pneumoniae NOT DETECTED NOT DETECTED Final   Proteus species NOT DETECTED NOT DETECTED Final   Serratia marcescens NOT DETECTED NOT DETECTED Final   Haemophilus influenzae NOT DETECTED NOT DETECTED Final   Neisseria meningitidis NOT DETECTED NOT DETECTED Final   Pseudomonas aeruginosa NOT DETECTED NOT DETECTED Final   Candida albicans NOT DETECTED NOT DETECTED Final   Candida glabrata NOT DETECTED NOT DETECTED Final   Candida krusei NOT DETECTED NOT DETECTED Final  Candida parapsilosis NOT DETECTED NOT DETECTED Final   Candida tropicalis NOT DETECTED NOT DETECTED Final    Comment: Performed at [facility], [place] [address] [place], [place] [address]  Urine culture     Status: Abnormal   Collection Time: 06/13/18 11:33 PM  Result Value Ref Range Status   Specimen Description   Final    URINE, RANDOM Performed at [facility], [address] [place], [place] [address]    Special Requests   Final    NONE Performed at [facility], [address], [place], [place] [address]    Culture (A)  Final    <10,000 COLONIES/mL INSIGNIFICANT GROWTH Performed at [place] [address], [place], [place] [address]    Report Status 06/15/2018 FINAL  Final     Radiology Studies: No results found.  Scheduled Meds: . levothyroxine  25 mcg Oral Q0600  . modafinil  100 mg Oral Daily  . OLANZapine  10 mg Oral Daily  . oxcarbazepine  600 mg Oral TID  . pantoprazole  40 mg Oral Daily  . QUEtiapine  300 mg Oral QHS  . rivaroxaban  20 mg Oral QPC supper  . sodium chloride flush  3 mL Intravenous Q12H   Continuous Infusions: . sodium chloride    . cefTRIAXone (ROCEPHIN)  IV 2 g (06/15/18 1143)     LOS: 3 days    Time spent: 25 minutes   Barton Dubois, MD [facility] Pager [phone]  If 7PM-7AM, please contact night-coverage [email or web address] Password [email or web address] 06/16/2018, 2:55 PM

## 2018-06-17 DIAGNOSIS — D696 Thrombocytopenia, unspecified: Secondary | ICD-10-CM | POA: Diagnosis not present

## 2018-06-17 DIAGNOSIS — N400 Enlarged prostate without lower urinary tract symptoms: Secondary | ICD-10-CM | POA: Diagnosis not present

## 2018-06-17 DIAGNOSIS — R41841 Cognitive communication deficit: Secondary | ICD-10-CM | POA: Diagnosis present

## 2018-06-17 DIAGNOSIS — N179 Acute kidney failure, unspecified: Secondary | ICD-10-CM | POA: Diagnosis not present

## 2018-06-17 DIAGNOSIS — I482 Chronic atrial fibrillation, unspecified: Secondary | ICD-10-CM | POA: Diagnosis present

## 2018-06-17 DIAGNOSIS — Z23 Encounter for immunization: Secondary | ICD-10-CM | POA: Diagnosis not present

## 2018-06-17 DIAGNOSIS — R2681 Unsteadiness on feet: Secondary | ICD-10-CM | POA: Diagnosis present

## 2018-06-17 DIAGNOSIS — E039 Hypothyroidism, unspecified: Secondary | ICD-10-CM | POA: Diagnosis not present

## 2018-06-17 DIAGNOSIS — M6281 Muscle weakness (generalized): Secondary | ICD-10-CM | POA: Diagnosis present

## 2018-06-17 DIAGNOSIS — R5381 Other malaise: Secondary | ICD-10-CM | POA: Diagnosis not present

## 2018-06-17 DIAGNOSIS — K5900 Constipation, unspecified: Secondary | ICD-10-CM | POA: Diagnosis present

## 2018-06-17 DIAGNOSIS — N390 Urinary tract infection, site not specified: Secondary | ICD-10-CM | POA: Diagnosis not present

## 2018-06-17 DIAGNOSIS — J449 Chronic obstructive pulmonary disease, unspecified: Secondary | ICD-10-CM | POA: Diagnosis not present

## 2018-06-17 DIAGNOSIS — G4733 Obstructive sleep apnea (adult) (pediatric): Secondary | ICD-10-CM | POA: Diagnosis present

## 2018-06-17 DIAGNOSIS — F419 Anxiety disorder, unspecified: Secondary | ICD-10-CM | POA: Diagnosis not present

## 2018-06-17 DIAGNOSIS — G9341 Metabolic encephalopathy: Secondary | ICD-10-CM | POA: Diagnosis not present

## 2018-06-17 DIAGNOSIS — I4821 Permanent atrial fibrillation: Secondary | ICD-10-CM | POA: Diagnosis not present

## 2018-06-17 DIAGNOSIS — F3131 Bipolar disorder, current episode depressed, mild: Secondary | ICD-10-CM | POA: Diagnosis not present

## 2018-06-17 DIAGNOSIS — E669 Obesity, unspecified: Secondary | ICD-10-CM | POA: Diagnosis present

## 2018-06-17 DIAGNOSIS — F250 Schizoaffective disorder, bipolar type: Secondary | ICD-10-CM | POA: Diagnosis present

## 2018-06-17 LAB — BASIC METABOLIC PANEL
Anion gap: 7 (ref 5–15)
BUN: 15 mg/dL (ref 8–23)
CALCIUM: 8.4 mg/dL — AB (ref 8.9–10.3)
CO2: 26 mmol/L (ref 22–32)
Chloride: 107 mmol/L (ref 98–111)
Creatinine, Ser: 0.98 mg/dL (ref 0.61–1.24)
GFR calc Af Amer: >60 mL/min (ref >60)
GFR calc non Af Amer: >60 mL/min (ref >60)
Glucose, Bld: 96 mg/dL (ref 70–99)
Potassium: 3.7 mmol/L (ref 3.5–5.1)
Sodium: 140 mmol/L (ref 135–145)

## 2018-06-17 LAB — CBC
HCT: 38.9 % — ABNORMAL LOW (ref 39.0–52.0)
Hemoglobin: 13 g/dL (ref 13.0–17.0)
MCH: 32.8 pg (ref 26.0–34.0)
MCHC: 33.4 g/dL (ref 30.0–36.0)
MCV: 98.2 fL (ref 80.0–100.0)
PLATELETS: 138 10*3/uL — AB (ref 150–400)
RBC: 3.96 MIL/uL — ABNORMAL LOW (ref 4.22–5.81)
RDW: 13.2 % (ref 11.5–15.5)
WBC: 4.6 10*3/uL (ref 4.0–10.5)
nRBC: 0 % (ref 0.0–0.2)

## 2018-06-17 LAB — LAMOTRIGINE LEVEL: Lamotrigine Lvl: NOT DETECTED ug/mL (ref 2.0–20.0)

## 2018-06-17 MED ORDER — ALPRAZOLAM 0.5 MG PO TABS: 0.5000 mg | ORAL_TABLET | Freq: Two times a day (BID) | ORAL | 0 refills | Status: DC | PRN

## 2018-06-17 MED ORDER — CEFDINIR 300 MG PO CAPS: 300.0000 mg | ORAL_CAPSULE | Freq: Two times a day (BID) | ORAL | 0 refills | Status: DC

## 2018-06-17 NOTE — Discharge Summary (Signed)
Physician Discharge Summary  Casey [name] CWC:[id number] DOB: 28-Feb-1946 DOA: 06/13/2018  PCP: Sharilyn Sites, MD  Admit date: 06/13/2018 Discharge date: 06/17/2018  Admitted From: Home Disposition:  SNF  Recommendations for Outpatient Follow-up:  1. Follow up with PCP in 1-2 weeks 2. Please obtain BMP/CBC in one week    Discharge Condition: Stable CODE STATUS:FULL Diet recommendation: Heart Healthy   Brief/Interim Summary: a 72 y/o male with PMH for bipolar disorder, atrial fibrillation on Xarelto, OSA, hypothyroidism, and COPD, presented to the ED with fevers, hallucinations, chills, confusion, dysuria, and falls. The wife expresses that they were sitting on the sofa and the patient started hallucinating, after this she took her temperature and found that it was at 104 F. She proceeded to call EMS and the patient was brought to the hospital.  The patient's urinalysis was consistent with UTI.  He was started on ceftriaxone with improvement of his mental status.  The patient was noted to be deconditioned.  Physical therapy was consulted.  They recommended skilled nursing facility.  The patient defervesced and his mental status improved.  Patient was transitioned to oral antibiotics to finish a one-week course of therapy.  Discharge Diagnoses:  Sepsis, suspected secondary to UTI -Patient did not have fevers x 48 hrs prior to d/c -Urine culture collected; <10,000 colonies/mL, insignificant growth (per record reviewed cultures were taken after antibiotics initiated in ED). -Blood cultures reporting growth of gram positive cocci; MRSA CNS 1 of 2 sets (contaminant)  -continue ceftriaxone during the hospitalization -d/c with cefdinir x 3 more days to complete one week  Acute Kidney Injury -Significantly improved after fluid resuscitation -serum creatinine peaked 1.54 -Will continue to monitor trend intermittently   -Cr back to baseline -Baseline creatinine 7.6-1.6  Acute  metabolic encephalopathy -Family reported recent episodes of confusion and hallucinations that seemed to have resolved after fluid resuscitation and antibiotics -CT Head negative for acute intracranial abnormalities -Continue to treat infection with IV antibiotics -Continue supportive care  Permanent Atrial fibrillation -Rate has normalized with IV fluids and antipyretics -CHADS-VASc of 2 -Continue Xarelto -Now is rate controlled  Thrombocytopenia -Has been chronic dating back to June 2017 -Acute worsening secondary to sepsis -No objective stigmata of bleeding -No signs of overt bleeding.  Physical deconditioning -PT has seen the patient and recommended SNF -family in agreement.  Bipolar disorder -Patient states that he has been battling with depression for the past year. Following with behavioral health with adjustment of medications -Denies suicide ideation or homicidal ideation -Patient was having hallucinations in the setting of acute febrile illness -Will continue Zyprexa, Trileptal, and Seroquel.   Hypothyroidism -Will continue Synthroid  Obstructive Sleep Apnea -Continue CPAP at bedtime -Continue modafinil  Obesity class 1 -Body mass index is 31.47 kg/m.  -Patient was counseled on the importance of weight loss, and the possible side effects the his over weight can have on his overall health.  COPD -No wheezing and no SOB -Continue PRN albuterol  Constipation -start Miralax and Colace -had BM     Discharge Instructions   Allergies as of 06/17/2018      Reactions   Amlodipine Other (See Comments)   Stopped due to kidney issues   Vicodin [hydrocodone-acetaminophen] Other (See Comments)   Patient is unsure of reaction      Medication List    TAKE these medications   acetaminophen 500 MG tablet Commonly known as:  TYLENOL Take 1,000 mg by mouth every 6 (six) hours as needed for headache.   Albuterol  Sulfate 108 (90 Base) MCG/ACT  Aepb Inhale 1-2 puffs into the lungs as needed.   ALPRAZolam 0.5 MG tablet Commonly known as:  XANAX Take 1 tablet (0.5 mg total) by mouth 2 (two) times daily as needed for anxiety.   BENEFIBER PO Take 1 Dose by mouth as needed (constapation).   buPROPion 150 MG 24 hr tablet Commonly known as:  WELLBUTRIN XL Take 150 mg by mouth. 1/d x 1wk, then 2/d   cefdinir 300 MG capsule Commonly known as:  OMNICEF Take 1 capsule (300 mg total) by mouth 2 (two) times daily.   cholecalciferol 1000 units tablet Commonly known as:  VITAMIN D Take 2,000 Units by mouth daily.   DIGESTIVE HEALTH PROBIOTIC Caps Take 1 tablet by mouth daily.   fluticasone 50 MCG/ACT nasal spray Commonly known as:  FLONASE Place 2 sprays into both nostrils daily.   lamoTRIgine 150 MG tablet Commonly known as:  LAMICTAL Take 150 mg by mouth daily.   levothyroxine 25 MCG tablet Commonly known as:  SYNTHROID, LEVOTHROID Take 25 mcg by mouth daily before breakfast.   ondansetron 4 MG disintegrating tablet Commonly known as:  ZOFRAN-ODT Take 1 tablet by mouth 3 (three) times daily as needed.   oxcarbazepine 600 MG tablet Commonly known as:  TRILEPTAL Take 1 tablet (600 mg total) by mouth 3 (three) times daily.   pantoprazole 40 MG tablet Commonly known as:  PROTONIX TAKE ONE TABLET BY MOUTH ONCE DAILY   QUEtiapine 300 MG tablet Commonly known as:  SEROQUEL 1/2 tab at bed for 1 week then 1 whole tab at bed   rivaroxaban 20 MG Tabs tablet Commonly known as:  XARELTO Take 20 mg by mouth daily after supper.   vitamin B-12 500 MCG tablet Commonly known as:  CYANOCOBALAMIN Take 500 mcg by mouth daily.      Contact information for after-discharge care    [facility] .   Service:  Skilled Nursing Contact information: [place] [address] [phone]             Allergies  Allergen Reactions  . Amlodipine Other (See  Comments)    Stopped due to kidney issues  . Vicodin [Hydrocodone-Acetaminophen] Other (See Comments)    Patient is unsure of reaction    Consultations:  none   Procedures/Studies: Dg Chest 2 View  Result Date: 06/13/2018 CLINICAL DATA:  Shortness of breath. Fever. EXAM: CHEST - 2 VIEW COMPARISON:  Chest and rib radiographs 04/25/2016 FINDINGS: Chronic hyperinflation and bronchial thickening.The cardiomediastinal contours are normal. Chronic scarring at the left lung base. Pulmonary vasculature is normal. No consolidation, pleural effusion, or pneumothorax. No acute osseous abnormalities are seen. IMPRESSION: Chronic hyperinflation and bronchial thickening. Scarring at the left lung base. No acute findings. Electronically Signed   By: Keith Rake M.D.   On: 06/13/2018 23:05   Dg Lumbar Spine Complete  Result Date: 06/14/2018 CLINICAL DATA:  Initial evaluation for acute trauma, fall. EXAM: LUMBAR SPINE - COMPLETE 4+ VIEW COMPARISON:  Prior MRI from 05/23/2016. FINDINGS: Vertebral bodies normally aligned with preservation of the normal lumbar lordosis. No listhesis or malalignment. Vertebral body heights maintained without evidence for acute or chronic fracture. Visualized sacrum and pelvis intact. No discrete osseous lesions. Moderate to advanced facet arthropathy throughout the lumbar spine, most notable at L5-S1. Mild degenerative intervertebral disc space narrowing at L2-3 through L5-S1. Prominent bridging osteophytic spurring noted throughout the lumbar spine. Visualized soft tissues within normal  limits. IMPRESSION: 1. No radiographic evidence for acute abnormality within the lumbar spine. 2. Moderate to advanced facet arthropathy throughout the lumbar spine, most notable at L5-S1. Electronically Signed   By: Jeannine Boga M.D.   On: 06/14/2018 00:26   Dg Pelvis 1-2 Views  Result Date: 06/13/2018 CLINICAL DATA:  Post fall. EXAM: PELVIS - 1-2 VIEW COMPARISON:  None. FINDINGS:  The cortical margins of the bony pelvis are intact. No fracture. Pubic symphysis and sacroiliac joints are congruent. Both femoral heads are well-seated in the respective acetabula. Bilateral acetabular spurring consistent with mild degenerative change. Enthesopathy at the right hamstring insertion. IMPRESSION: No evidence of pelvic fracture. Electronically Signed   By: Keith Rake M.D.   On: 06/13/2018 23:07   Dg Elbow Complete Right  Result Date: 06/13/2018 CLINICAL DATA:  Right elbow pain.  Fall. EXAM: RIGHT ELBOW - COMPLETE 3+ VIEW COMPARISON:  None. FINDINGS: Lateral view limited by positioning. There is no evidence of fracture, dislocation, or joint effusion. Mild degenerative changes the radiocarpal and ulnar trochlear joints. Enthesopathic changes about the humeral epicondyles. Soft tissues are unremarkable. IMPRESSION: Degenerative change without acute fracture or dislocation of the elbow. Electronically Signed   By: Keith Rake M.D.   On: 06/13/2018 23:08   Ct Head Wo Contrast  Result Date: 06/13/2018 CLINICAL DATA:  Per ED notes: Pt arrived to er by ems after being called out for fever, altered mental status, pt reports that he has had several falls since Thursday and has not felt good since then, fever with ems was 104.0, pt given 1000 mg tylenol po by ems enroute to er along with 500cc NS bolus, pt has abrasion noted to rigth temporal area and right elbow area. EXAM: CT HEAD WITHOUT CONTRAST TECHNIQUE: Contiguous axial images were obtained from the base of the skull through the vertex without intravenous contrast. COMPARISON:  02/06/2017 FINDINGS: Brain: No evidence of acute infarction, hemorrhage, hydrocephalus, extra-axial collection or mass lesion/mass effect. There is ventricular and sulcal enlargement reflecting mild atrophy. There is periventricular white matter hypoattenuation consistent with mild chronic microvascular ischemic change. These findings are stable. Vascular: No  hyperdense vessel or unexpected calcification. Skull: Normal. Negative for fracture or focal lesion. Sinuses/Orbits: Globes and orbits are unremarkable. The sinuses and mastoid air cells are clear Other: None. IMPRESSION: 1. No acute intracranial abnormalities. 2. Mild atrophy and chronic microvascular ischemic change. Electronically Signed   By: Lajean Manes M.D.   On: 06/13/2018 23:08        Discharge Exam: Vitals:   06/16/18 2200 06/17/18 0545  BP: (!) 166/80 (!) 186/89  Pulse: 83 69  Resp: 18 18  Temp: 98.3 F (36.8 C) 97.8 F (36.6 C)  SpO2: 97% 95%   Vitals:   06/16/18 1434 06/16/18 2017 06/16/18 2200 06/17/18 0545  BP: (!) 171/91  (!) 166/80 (!) 186/89  Pulse: 89  83 69  Resp: 18  18 18   Temp:   98.3 F (36.8 C) 97.8 F (36.6 C)  TempSrc:   Oral Oral  SpO2: 93% 94% 97% 95%  Weight:      Height:        General: Pt is alert, awake, not in acute distress Cardiovascular: IRRR, S1/S2 +, no rubs, no gallops Respiratory: CTA bilaterally, no wheezing, no rhonchi Abdominal: Soft, NT, ND, bowel sounds + Extremities: no edema, no cyanosis   The results of significant diagnostics from this hospitalization (including imaging, microbiology, ancillary and laboratory) are listed below for reference.  Significant Diagnostic Studies: Dg Chest 2 View  Result Date: 06/13/2018 CLINICAL DATA:  Shortness of breath. Fever. EXAM: CHEST - 2 VIEW COMPARISON:  Chest and rib radiographs 04/25/2016 FINDINGS: Chronic hyperinflation and bronchial thickening.The cardiomediastinal contours are normal. Chronic scarring at the left lung base. Pulmonary vasculature is normal. No consolidation, pleural effusion, or pneumothorax. No acute osseous abnormalities are seen. IMPRESSION: Chronic hyperinflation and bronchial thickening. Scarring at the left lung base. No acute findings. Electronically Signed   By: Keith Rake M.D.   On: 06/13/2018 23:05   Dg Lumbar Spine Complete  Result Date:  06/14/2018 CLINICAL DATA:  Initial evaluation for acute trauma, fall. EXAM: LUMBAR SPINE - COMPLETE 4+ VIEW COMPARISON:  Prior MRI from 05/23/2016. FINDINGS: Vertebral bodies normally aligned with preservation of the normal lumbar lordosis. No listhesis or malalignment. Vertebral body heights maintained without evidence for acute or chronic fracture. Visualized sacrum and pelvis intact. No discrete osseous lesions. Moderate to advanced facet arthropathy throughout the lumbar spine, most notable at L5-S1. Mild degenerative intervertebral disc space narrowing at L2-3 through L5-S1. Prominent bridging osteophytic spurring noted throughout the lumbar spine. Visualized soft tissues within normal limits. IMPRESSION: 1. No radiographic evidence for acute abnormality within the lumbar spine. 2. Moderate to advanced facet arthropathy throughout the lumbar spine, most notable at L5-S1. Electronically Signed   By: Jeannine Boga M.D.   On: 06/14/2018 00:26   Dg Pelvis 1-2 Views  Result Date: 06/13/2018 CLINICAL DATA:  Post fall. EXAM: PELVIS - 1-2 VIEW COMPARISON:  None. FINDINGS: The cortical margins of the bony pelvis are intact. No fracture. Pubic symphysis and sacroiliac joints are congruent. Both femoral heads are well-seated in the respective acetabula. Bilateral acetabular spurring consistent with mild degenerative change. Enthesopathy at the right hamstring insertion. IMPRESSION: No evidence of pelvic fracture. Electronically Signed   By: Keith Rake M.D.   On: 06/13/2018 23:07   Dg Elbow Complete Right  Result Date: 06/13/2018 CLINICAL DATA:  Right elbow pain.  Fall. EXAM: RIGHT ELBOW - COMPLETE 3+ VIEW COMPARISON:  None. FINDINGS: Lateral view limited by positioning. There is no evidence of fracture, dislocation, or joint effusion. Mild degenerative changes the radiocarpal and ulnar trochlear joints. Enthesopathic changes about the humeral epicondyles. Soft tissues are unremarkable. IMPRESSION:  Degenerative change without acute fracture or dislocation of the elbow. Electronically Signed   By: Keith Rake M.D.   On: 06/13/2018 23:08   Ct Head Wo Contrast  Result Date: 06/13/2018 CLINICAL DATA:  Per ED notes: Pt arrived to er by ems after being called out for fever, altered mental status, pt reports that he has had several falls since Thursday and has not felt good since then, fever with ems was 104.0, pt given 1000 mg tylenol po by ems enroute to er along with 500cc NS bolus, pt has abrasion noted to rigth temporal area and right elbow area. EXAM: CT HEAD WITHOUT CONTRAST TECHNIQUE: Contiguous axial images were obtained from the base of the skull through the vertex without intravenous contrast. COMPARISON:  02/06/2017 FINDINGS: Brain: No evidence of acute infarction, hemorrhage, hydrocephalus, extra-axial collection or mass lesion/mass effect. There is ventricular and sulcal enlargement reflecting mild atrophy. There is periventricular white matter hypoattenuation consistent with mild chronic microvascular ischemic change. These findings are stable. Vascular: No hyperdense vessel or unexpected calcification. Skull: Normal. Negative for fracture or focal lesion. Sinuses/Orbits: Globes and orbits are unremarkable. The sinuses and mastoid air cells are clear Other: None. IMPRESSION: 1. No acute intracranial abnormalities. 2. Mild  atrophy and chronic microvascular ischemic change. Electronically Signed   By: Lajean Manes M.D.   On: 06/13/2018 23:08     Microbiology: Recent Results (from the past 240 hour(s))  Blood Culture (routine x 2)     Status: None (Preliminary result)   Collection Time: 06/13/18  9:14 PM  Result Value Ref Range Status   Specimen Description BLOOD RIGHT HAND  Final   Special Requests   Final    BOTTLES DRAWN AEROBIC AND ANAEROBIC Blood Culture adequate volume   Culture   Final    NO GROWTH 4 DAYS Performed at [facility], [address], [place], [place]  [address]    Report Status PENDING  Incomplete  Blood Culture (routine x 2)     Status: Abnormal   Collection Time: 06/13/18  9:19 PM  Result Value Ref Range Status   Specimen Description   Final    BLOOD RIGHT ARM Performed at [facility], [address], [place], [place] [address]    Special Requests   Final    BOTTLES DRAWN AEROBIC AND ANAEROBIC Blood Culture adequate volume Performed at [facility], [address] [place], [place] [address]    Culture  Setup Time   Final    GRAM POSITIVE COCCI Gram Stain Report Called to,Read Back By and Verified With: FOLEY,B. AT [id number] ON 06/14/2018 BY EVA IN BOTH AEROBIC AND ANAEROBIC BOTTLES CRITICAL RESULT CALLED TO, READ BACK BY AND VERIFIED WITH: B FOLLEY RN [id number] 1856 BY GF    Culture (A)  Final    STAPHYLOCOCCUS SPECIES (COAGULASE NEGATIVE) THE SIGNIFICANCE OF ISOLATING THIS ORGANISM FROM A SINGLE SET OF BLOOD CULTURES WHEN MULTIPLE SETS ARE DRAWN IS UNCERTAIN. PLEASE NOTIFY THE MICROBIOLOGY DEPARTMENT WITHIN ONE WEEK IF SPECIATION AND SENSITIVITIES ARE REQUIRED. Performed at [facility], [place] [address], [place], [place] [address]    Report Status 06/16/2018 FINAL  Final  Blood Culture ID Panel (Reflexed)     Status: Abnormal   Collection Time: 06/13/18  9:19 PM  Result Value Ref Range Status   Enterococcus species NOT DETECTED NOT DETECTED Final   Listeria monocytogenes NOT DETECTED NOT DETECTED Final   Staphylococcus species DETECTED (A) NOT DETECTED Final    Comment: Methicillin (oxacillin) resistant coagulase negative staphylococcus. Possible blood culture contaminant (unless isolated from more than one blood culture draw or clinical case suggests pathogenicity). No antibiotic treatment is indicated for blood  culture contaminants. CRITICAL RESULT CALLED TO, READ BACK BY AND VERIFIED WITH: B FOLLEY RN [phone] 1856 BY GF    Staphylococcus aureus (BCID) NOT DETECTED NOT DETECTED Final   Methicillin resistance DETECTED (A) NOT  DETECTED Final    Comment: CRITICAL RESULT CALLED TO, READ BACK BY AND VERIFIED WITH: B FOLLEY RN [phone] 1856 BY GF    Streptococcus species NOT DETECTED NOT DETECTED Final   Streptococcus agalactiae NOT DETECTED NOT DETECTED Final   Streptococcus pneumoniae NOT DETECTED NOT DETECTED Final   Streptococcus pyogenes NOT DETECTED NOT DETECTED Final   Acinetobacter baumannii NOT DETECTED NOT DETECTED Final   Enterobacteriaceae species NOT DETECTED NOT DETECTED Final   Enterobacter cloacae complex NOT DETECTED NOT DETECTED Final   Escherichia coli NOT DETECTED NOT DETECTED Final   Klebsiella oxytoca NOT DETECTED NOT DETECTED Final   Klebsiella pneumoniae NOT DETECTED NOT DETECTED Final   Proteus species NOT DETECTED NOT DETECTED Final   Serratia marcescens NOT DETECTED NOT DETECTED Final   Haemophilus influenzae NOT DETECTED NOT DETECTED Final   Neisseria  meningitidis NOT DETECTED NOT DETECTED Final   Pseudomonas aeruginosa NOT DETECTED NOT DETECTED Final   Candida albicans NOT DETECTED NOT DETECTED Final   Candida glabrata NOT DETECTED NOT DETECTED Final   Candida krusei NOT DETECTED NOT DETECTED Final   Candida parapsilosis NOT DETECTED NOT DETECTED Final   Candida tropicalis NOT DETECTED NOT DETECTED Final    Comment: Performed at [facility], [place] [address], [place], [place] [address]  Urine culture     Status: Abnormal   Collection Time: 06/13/18 11:33 PM  Result Value Ref Range Status   Specimen Description   Final    URINE, RANDOM Performed at [facility], [address] [place], [place] [address]    Special Requests   Final    NONE Performed at [facility], [address], [place], [place] [address]    Culture (A)  Final    <10,000 COLONIES/mL INSIGNIFICANT GROWTH Performed at [facility] [address] [place], [place] [address]    Report Status 06/15/2018 FINAL  Final     Labs: Basic Metabolic Panel: Recent Labs  Lab 06/13/18 2114 06/14/18 0511  06/15/18 0532 06/17/18 0540  NA 140 140 140 140  K 3.8 3.9 3.5 3.7  CL 110 112* 110 107  CO2 22 22 24 26   GLUCOSE 146* 159* 125* 96  BUN 31* 29* 21 15  CREATININE 1.54* 1.30* 1.12 0.98  CALCIUM 8.8* 7.7* 8.0* 8.4*   Liver Function Tests: Recent Labs  Lab 06/13/18 2114  AST 38  ALT 28  ALKPHOS 73  BILITOT 0.9  PROT 6.9  ALBUMIN 3.6   No results for input(s): LIPASE, AMYLASE in the last 168 hours. No results for input(s): AMMONIA in the last 168 hours. CBC: Recent Labs  Lab 06/13/18 2114 06/14/18 0511 06/15/18 0532 06/17/18 0540  WBC 8.6 8.6 5.6 4.6  NEUTROABS 7.6 7.2  --   --   HGB 13.7 12.0* 12.2* 13.0  HCT 41.5 36.9* 37.3* 38.9*  MCV 98.6 100.0 98.9 98.2  PLT 113* 105* 100* 138*   Cardiac Enzymes: Recent Labs  Lab 06/13/18 2114  TROPONINI <0.03   BNP: Invalid input(s): POCBNP CBG: No results for input(s): GLUCAP in the last 168 hours.  Time coordinating discharge:  36 minutes  Signed:  Orson Eva, DO [facility] Pager: [phone] 06/17/2018, 9:49 AM

## 2018-06-17 NOTE — Progress Notes (Signed)
Physical Therapy Treatment Patient Details Name: Casey [name] MRN: [id number] DOB: Apr 19, 1946 Today's Date: 06/17/2018    History of Present Illness Casey [name] is a 72 y.o. male with medical history significant for bipolar disorder, atrial fibrillation on Xarelto, OSA, hypothyroidism, and COPD, now presenting to the emergency department for evaluation of fevers, confusion, hallucinations, dysuria, and falls.  Patient is accompanied by his wife and daughter who assist with the history.  He has had waxing and waning confusion with hallucinations and off-balance gait for the past 3 days, was found to be febrile, and he complains of some mild dysuria but denies flank pain.  He has some superficial abrasions from his recent falls, denies losing consciousness, but reports some pain at the elbow, hips, and low back.  He denies rhinorrhea, sore throat, shortness of breath, or cough.  Denies abdominal pain, nausea, or vomiting.  Reports some recent constipation that he treated effectively with stool softener at home.  Denies any suicidal or homicidal ideation, but reports that his depression has been more severe for about a year now, following with behavioral health for adjustment in his medications.  No sick contacts.     PT Comments    Pt sitting in chair upon entrance with reports of bowel movement earlier today.  Pt friendly and willing to participate with therapy today.  Min cueing for hand placement to assist with sit to stand.  Increased RW height with good posture noted with gait training today.  Able to increase distance with gait training limited only by fatigue.  EOS pt left in chair with call bell within reach and chair alarm set.    Follow Up Recommendations  SNF     Equipment Recommendations       Recommendations for Other Services       Precautions / Restrictions Precautions Precautions: Fall    Mobility  Bed Mobility               General bed mobility comments:  Sitting in chair upon therapist entrance  Transfers Overall transfer level: Needs assistance Equipment used: Rolling walker (2 wheeled) Transfers: Sit to/from Stand Sit to Stand: Min assist         General transfer comment: slow labored movement, cueing for hand placement with STS  Ambulation/Gait Ambulation/Gait assistance: Min assist Gait Distance (Feet): 60 Feet Assistive device: Rolling walker (2 wheeled) Gait Pattern/deviations: Decreased step length - right;Decreased step length - left;Decreased stride length Gait velocity: decreased   General Gait Details: slow labored cadence, increased RW height with improved posture during gait.  Limited secondary to fatigue with increased distance with gait   Stairs             Wheelchair Mobility    Modified Rankin (Stroke Patients Only)       Balance                                            Cognition Arousal/Alertness: Awake/alert Behavior During Therapy: WFL for tasks assessed/performed Overall Cognitive Status: Within Functional Limits for tasks assessed                                        Exercises      General Comments        Pertinent  Vitals/Pain Pain Assessment: No/denies pain    Home Living                      Prior Function            PT Goals (current goals can now be found in the care plan section)      Frequency    Min 3X/week      PT Plan Current plan remains appropriate    Co-evaluation              AM-PAC PT "6 Clicks" Mobility   Outcome Measure  Help needed turning from your back to your side while in a flat bed without using bedrails?: None Help needed moving from lying on your back to sitting on the side of a flat bed without using bedrails?: A Little Help needed moving to and from a bed to a chair (including a wheelchair)?: A Little Help needed standing up from a chair using your arms (e.g., wheelchair or bedside  chair)?: A Little Help needed to walk in hospital room?: A Little Help needed climbing 3-5 steps with a railing? : A Lot 6 Click Score: 18    End of Session Equipment Utilized During Treatment: Gait belt Activity Tolerance: Patient tolerated treatment well;Patient limited by fatigue Patient left: in chair;with call bell/phone within reach Nurse Communication: Mobility status PT Visit Diagnosis: Unsteadiness on feet (R26.81);Other abnormalities of gait and mobility (R26.89);Muscle weakness (generalized) (M62.81)     Time: 7340-3709 PT Time Calculation (min) (ACUTE ONLY): 17 min  Charges:  $Therapeutic Activity: 8-22 mins                    [address], LPTA; CBIS [phone]   Aldona Lento 06/17/2018, 11:55 AM

## 2018-06-17 NOTE — Plan of Care (Signed)

## 2018-06-17 NOTE — Care Management Important Message (Signed)
Important Message  Patient Details  Name: Casey [name] MRN: [id number] Date of Birth: [birth year]   Medicare Important Message Given:  Yes    Shelda Altes 06/17/2018, 1:57 PM

## 2018-06-17 NOTE — Clinical Social Work Placement (Signed)
   CLINICAL SOCIAL WORK PLACEMENT  NOTE  Date:  06/17/2018  Patient Details  Name: Casey [name] MRN: [id number] Date of Birth: Aug 13, 1945  Clinical Social Work is seeking post-discharge placement for this patient at the [place] level of care (*CSW will initial, date and re-position this form in  chart as items are completed):  Yes   Patient/family provided with [facility] Work Department's list of facilities offering this level of care within the geographic area requested by the patient (or if unable, by the patient's family).  Yes   Patient/family informed of their freedom to choose among providers that offer the needed level of care, that participate in Medicare, Medicaid or managed care program needed by the patient, have an available bed and are willing to accept the patient.  Yes   Patient/family informed of [facility]'s ownership interest in [facility] and [facility], as well as of the fact that they are under no obligation to receive care at these facilities.  PASRR submitted to EDS on 06/15/18     PASRR number received on 06/15/18     Existing PASRR number confirmed on       FL2 transmitted to all facilities in geographic area requested by pt/family on 06/15/18     FL2 transmitted to all facilities within larger geographic area on       Patient informed that his/her managed care company has contracts with or will negotiate with certain facilities, including the following:        Yes   Patient/family informed of bed offers received.  Patient chooses bed at [facility]     Physician recommends and patient chooses bed at      Patient to be transferred to [facility] [place] on 06/17/18.  Patient to be transferred to facility by wife     Patient family notified on 06/17/18 of transfer.  Name of family member notified:  wife     PHYSICIAN       Additional Comment:  Wife wants to transport patient tp [facility].   Ok to arrive after 2PM.  CSW sighn off.   _______________________________________________ Trish Mage, LCSW 06/17/2018, 1:39 PM

## 2018-06-18 DIAGNOSIS — G9341 Metabolic encephalopathy: Secondary | ICD-10-CM | POA: Diagnosis not present

## 2018-06-18 DIAGNOSIS — N179 Acute kidney failure, unspecified: Secondary | ICD-10-CM | POA: Diagnosis not present

## 2018-06-18 DIAGNOSIS — N390 Urinary tract infection, site not specified: Secondary | ICD-10-CM | POA: Diagnosis not present

## 2018-06-18 LAB — CULTURE, BLOOD (ROUTINE X 2)
Culture: NO GROWTH
Special Requests: ADEQUATE

## 2018-06-22 DIAGNOSIS — J449 Chronic obstructive pulmonary disease, unspecified: Secondary | ICD-10-CM | POA: Diagnosis not present

## 2018-06-22 DIAGNOSIS — N390 Urinary tract infection, site not specified: Secondary | ICD-10-CM | POA: Diagnosis not present

## 2018-06-22 DIAGNOSIS — N400 Enlarged prostate without lower urinary tract symptoms: Secondary | ICD-10-CM | POA: Diagnosis not present

## 2018-06-22 DIAGNOSIS — N179 Acute kidney failure, unspecified: Secondary | ICD-10-CM | POA: Diagnosis not present

## 2018-06-22 DIAGNOSIS — G9341 Metabolic encephalopathy: Secondary | ICD-10-CM | POA: Diagnosis not present

## 2018-06-24 ENCOUNTER — Ambulatory Visit (INDEPENDENT_AMBULATORY_CARE_PROVIDER_SITE_OTHER): Payer: Medicare Other | Admitting: Psychiatry

## 2018-06-24 DIAGNOSIS — F419 Anxiety disorder, unspecified: Secondary | ICD-10-CM

## 2018-06-24 DIAGNOSIS — F3131 Bipolar disorder, current episode depressed, mild: Secondary | ICD-10-CM | POA: Diagnosis not present

## 2018-06-24 DIAGNOSIS — E039 Hypothyroidism, unspecified: Secondary | ICD-10-CM | POA: Diagnosis not present

## 2018-06-24 DIAGNOSIS — N390 Urinary tract infection, site not specified: Secondary | ICD-10-CM | POA: Diagnosis not present

## 2018-06-24 DIAGNOSIS — G9341 Metabolic encephalopathy: Secondary | ICD-10-CM | POA: Diagnosis not present

## 2018-06-24 MED ORDER — OXCARBAZEPINE 600 MG PO TABS: 600.0000 mg | ORAL_TABLET | Freq: Three times a day (TID) | ORAL | 1 refills | Status: DC

## 2018-06-24 MED ORDER — BUPROPION HCL ER (XL) 300 MG PO TB24: ORAL_TABLET | ORAL | 0 refills | Status: DC

## 2018-06-24 NOTE — Progress Notes (Signed)
[facility]  Patient ID: Casey [name],  MRN: [id number]  PCP: Sharilyn Sites, MD  Date of Evaluation: 06/24/2018 Time spent:30 minutes  Chief Complaint:   HISTORY/CURRENT STATUS: HPI last visit not doing well. Since then admitted to hospital with uti sepsis. Currently at rehab. Mood and anxeity much better. Meds are some different than his last visit. Currently is on xanax 0.5 mg twice daily.  Wellbutrin XL 300 mg a day.  Is on Seroquel 300 mg 1 a day, lamictal, trileptal As mentioned he is doing much better.   Individual Medical History/ Review of Systems: Changes? :No   Allergies: Amlodipine and Vicodin [hydrocodone-acetaminophen]  Current Medications:  Current Outpatient Medications:  .  ALPRAZolam (XANAX) 0.5 MG tablet, Take 1 tablet (0.5 mg total) by mouth 2 (two) times daily as needed for anxiety., Disp: 10 tablet, Rfl: 0 .  buPROPion (WELLBUTRIN XL) 300 MG 24 hr tablet, 1 tab daily, Disp: 30 tablet, Rfl: 0 .  lamoTRIgine (LAMICTAL) 150 MG tablet, Take 150 mg by mouth daily., Disp: , Rfl:  .  QUEtiapine (SEROQUEL) 300 MG tablet, 1/2 tab at bed for 1 week then 1 whole tab at bed, Disp: 30 tablet, Rfl: 1 .  acetaminophen (TYLENOL) 500 MG tablet, Take 1,000 mg by mouth every 6 (six) hours as needed for headache., Disp: , Rfl:  .  Albuterol Sulfate 108 (90 Base) MCG/ACT AEPB, Inhale 1-2 puffs into the lungs as needed. , Disp: , Rfl:  .  cefdinir (OMNICEF) 300 MG capsule, Take 1 capsule (300 mg total) by mouth 2 (two) times daily., Disp: 6 capsule, Rfl: 0 .  cholecalciferol (VITAMIN D) 1000 UNITS tablet, Take 2,000 Units by mouth daily., Disp: , Rfl:  .  fluticasone (FLONASE) 50 MCG/ACT nasal spray, Place 2 sprays into both nostrils daily., Disp: , Rfl: 11 .  Lactobacillus (DIGESTIVE HEALTH PROBIOTIC) CAPS, Take 1 tablet by mouth daily., Disp: , Rfl:  .  levothyroxine (SYNTHROID, LEVOTHROID) 25 MCG tablet, Take 25 mcg by mouth daily before breakfast. , Disp: , Rfl:   .  ondansetron (ZOFRAN-ODT) 4 MG disintegrating tablet, Take 1 tablet by mouth 3 (three) times daily as needed., Disp: , Rfl: 2 .  oxcarbazepine (TRILEPTAL) 600 MG tablet, Take 1 tablet (600 mg total) by mouth 3 (three) times daily., Disp: 90 tablet, Rfl: 1 .  pantoprazole (PROTONIX) 40 MG tablet, TAKE ONE TABLET BY MOUTH ONCE DAILY, Disp: 30 tablet, Rfl: 11 .  rivaroxaban (XARELTO) 20 MG TABS tablet, Take 20 mg by mouth daily after supper. , Disp: , Rfl:  .  vitamin B-12 (CYANOCOBALAMIN) 500 MCG tablet, Take 500 mcg by mouth daily., Disp: , Rfl:  .  Wheat Dextrin (BENEFIBER PO), Take 1 Dose by mouth as needed (constapation). , Disp: , Rfl:  Medication Side Effects: none  Family Medical/ Social History: Changes?  Patient currently in rehab from his recent hospitalization.   MENTAL HEALTH EXAM:  There were no vitals taken for this visit.There is no height or weight on file to calculate BMI.  General Appearance: Casual  Eye Contact:  Good  Speech:  Clear and Coherent and Garbled  Volume:  Normal  Mood:  Depressed  Affect:  Appropriate  Thought Process:  Linear  Orientation:  Full (Time, Place, and Person)  Thought Content: Logical   Suicidal Thoughts:  No  Homicidal Thoughts:  No  Memory:  WNL  Judgement:  Good  Insight:  Good  Psychomotor Activity:  Normal  Concentration:  Concentration: Good  Recall:  Roel Cluck of Knowledge: Good  Language: Good  Assets:  Social Support  ADL's:  Intact  Cognition: WNL  Prognosis:  Good    DIAGNOSES:    ICD-10-CM   1. Bipolar affective disorder, currently depressed, mild ([place]) F31.31   2. Anxiety F41.9     Receiving Psychotherapy: No    RECOMMENDATIONS: Patient is to continue in rehab for 2 more weeks.  They were to continue his Xanax 0.5 mg twice daily, Wellbutrin XL 300 a day, Seroquel 300 mg 1 a day is Trileptal 600 mg 3 times a day.  He is to stop Lamictal as he has a rash over the last day. I will see him again in 1  month.   Comer Locket, PA-C

## 2018-06-25 DIAGNOSIS — N179 Acute kidney failure, unspecified: Secondary | ICD-10-CM | POA: Diagnosis not present

## 2018-06-25 DIAGNOSIS — R5381 Other malaise: Secondary | ICD-10-CM | POA: Diagnosis not present

## 2018-06-25 DIAGNOSIS — G9341 Metabolic encephalopathy: Secondary | ICD-10-CM | POA: Diagnosis not present

## 2018-06-25 DIAGNOSIS — N390 Urinary tract infection, site not specified: Secondary | ICD-10-CM | POA: Diagnosis not present

## 2018-06-30 ENCOUNTER — Ambulatory Visit (HOSPITAL_COMMUNITY)
Admission: RE | Admit: 2018-06-30 | Discharge: 2018-06-30 | Disposition: A | Discharge status: Home / Self Care | Payer: Medicare Other | Source: Ambulatory Visit | Attending: Nurse Practitioner | Admitting: Nurse Practitioner

## 2018-06-30 ENCOUNTER — Telehealth: Payer: Self-pay | Admitting: Internal Medicine

## 2018-06-30 ENCOUNTER — Other Ambulatory Visit (HOSPITAL_COMMUNITY)
Admission: RE | Admit: 2018-06-30 | Discharge: 2018-06-30 | Disposition: A | Discharge status: Home / Self Care | Payer: Medicare Other | Source: Ambulatory Visit | Attending: Nurse Practitioner | Admitting: Nurse Practitioner

## 2018-06-30 DIAGNOSIS — R112 Nausea with vomiting, unspecified: Secondary | ICD-10-CM

## 2018-06-30 LAB — CBC WITH DIFFERENTIAL/PLATELET
Abs Immature Granulocytes: 0.01 10*3/uL (ref 0.00–0.07)
Basophils Absolute: 0 10*3/uL (ref 0.0–0.1)
Basophils Relative: 0 %
Eosinophils Absolute: 0.1 10*3/uL (ref 0.0–0.5)
Eosinophils Relative: 1 %
HEMATOCRIT: 43.9 % (ref 39.0–52.0)
HEMOGLOBIN: 14.4 g/dL (ref 13.0–17.0)
Immature Granulocytes: 0 %
LYMPHS PCT: 23 %
Lymphs Abs: 1.3 10*3/uL (ref 0.7–4.0)
MCH: 31.9 pg (ref 26.0–34.0)
MCHC: 32.8 g/dL (ref 30.0–36.0)
MCV: 97.1 fL (ref 80.0–100.0)
Monocytes Absolute: 0.5 10*3/uL (ref 0.1–1.0)
Monocytes Relative: 9 %
Neutro Abs: 3.6 10*3/uL (ref 1.7–7.7)
Neutrophils Relative %: 67 %
Platelets: 269 10*3/uL (ref 150–400)
RBC: 4.52 MIL/uL (ref 4.22–5.81)
RDW: 13.1 % (ref 11.5–15.5)
WBC: 5.4 10*3/uL (ref 4.0–10.5)
nRBC: 0 % (ref 0.0–0.2)

## 2018-06-30 LAB — COMPREHENSIVE METABOLIC PANEL
ALT: 23 U/L (ref 0–44)
AST: 28 U/L (ref 15–41)
Albumin: 4.1 g/dL (ref 3.5–5.0)
Alkaline Phosphatase: 89 U/L (ref 38–126)
Anion gap: 8 (ref 5–15)
BUN: 14 mg/dL (ref 8–23)
CO2: 24 mmol/L (ref 22–32)
Calcium: 9.2 mg/dL (ref 8.9–10.3)
Chloride: 107 mmol/L (ref 98–111)
Creatinine, Ser: 1.14 mg/dL (ref 0.61–1.24)
GFR calc Af Amer: >60 mL/min (ref >60)
GFR calc non Af Amer: >60 mL/min (ref >60)
Glucose, Bld: 111 mg/dL — ABNORMAL HIGH (ref 70–99)
Potassium: 4.3 mmol/L (ref 3.5–5.1)
SODIUM: 139 mmol/L (ref 135–145)
Total Bilirubin: 1.1 mg/dL (ref 0.3–1.2)
Total Protein: 7.3 g/dL (ref 6.5–8.1)

## 2018-06-30 MED ORDER — ONDANSETRON HCL 4 MG PO TABS: 4.0000 mg | ORAL_TABLET | Freq: Three times a day (TID) | ORAL | 2 refills | Status: DC

## 2018-06-30 NOTE — Addendum Note (Signed)
Addended by: Gordy Levan, ERIC A on: 06/30/2018 12:07 PM   Modules accepted: Orders

## 2018-06-30 NOTE — Telephone Encounter (Signed)
Spoke with spouse. [facility] are going to [facility] now. Lab orders faxed to [facility] lab for stat labs.

## 2018-06-30 NOTE — Telephone Encounter (Signed)
I'm sorry he's not well. Possible GI bug or food illness. Make sure he's still taking Protonix. I will send in an Rx for Zofran, take this before each meal.   I also want him to have a simple abdominal XRay (to make sure there's no blockage, etc) and a couple basic labs (to check for infection or abnormal electrolytes). These will all be entered as STAT. Try to have this all done today, they can all be done at [facility].    If his symptoms worsen where he is having worsening weakness, unable to keep down food or fluids, dizziness, passing out then proceed to the ER.  As an aside, ask him if he has N/V with every meal.  Otherwise please call and check on him on Thursday.  May need to follow-up with AB this afternoon in my absence. Will cc AB for FYI.

## 2018-06-30 NOTE — Telephone Encounter (Signed)
Spoke with pts spouse. Pt has c/o nausea that comes on all of a sudden with vomiting. His symptoms occur after he eats. He feels fine when eating and all of a sudden the nausea hits and vomiting. Spouse says it's a large amount that comes up. Pt feels weak after he vomits and his spouse notices him burping a lot. These symptoms started over the weekend Friday 06/26/18. Pt ate fish that day and he thought it tasted bad. Pt was in the nursing home but is at home now for good. On 06/13/18 pt was admitted to the hospital due to a UTI that ended up septic.

## 2018-06-30 NOTE — Telephone Encounter (Signed)
Left two detailed messages. Waiting on a return call.

## 2018-06-30 NOTE — Telephone Encounter (Signed)
Spouse notified of results

## 2018-06-30 NOTE — Telephone Encounter (Signed)
No acute abnormality on abdominal xray. Awaiting labs.

## 2018-06-30 NOTE — Telephone Encounter (Signed)
[phone] PLEASE CALL PATIENT WIFE, HE IS HAVING NAUSEA AFTER HE EATS

## 2018-07-02 ENCOUNTER — Encounter: Payer: Self-pay | Admitting: Internal Medicine

## 2018-07-02 DIAGNOSIS — F3162 Bipolar disorder, current episode mixed, moderate: Secondary | ICD-10-CM | POA: Diagnosis not present

## 2018-07-02 DIAGNOSIS — A419 Sepsis, unspecified organism: Secondary | ICD-10-CM | POA: Diagnosis not present

## 2018-07-02 DIAGNOSIS — Z6831 Body mass index (BMI) 31.0-31.9, adult: Secondary | ICD-10-CM | POA: Diagnosis not present

## 2018-07-02 DIAGNOSIS — N390 Urinary tract infection, site not specified: Secondary | ICD-10-CM | POA: Diagnosis not present

## 2018-07-20 ENCOUNTER — Ambulatory Visit (INDEPENDENT_AMBULATORY_CARE_PROVIDER_SITE_OTHER): Payer: Medicare Other | Admitting: Otolaryngology

## 2018-07-20 DIAGNOSIS — H903 Sensorineural hearing loss, bilateral: Secondary | ICD-10-CM | POA: Diagnosis not present

## 2018-07-20 DIAGNOSIS — R42 Dizziness and giddiness: Secondary | ICD-10-CM | POA: Diagnosis not present

## 2018-07-24 DIAGNOSIS — F3132 Bipolar disorder, current episode depressed, moderate: Secondary | ICD-10-CM | POA: Diagnosis not present

## 2018-07-27 ENCOUNTER — Ambulatory Visit (INDEPENDENT_AMBULATORY_CARE_PROVIDER_SITE_OTHER): Payer: Medicare Other | Admitting: Psychiatry

## 2018-07-27 DIAGNOSIS — F3131 Bipolar disorder, current episode depressed, mild: Secondary | ICD-10-CM | POA: Diagnosis not present

## 2018-07-27 DIAGNOSIS — F419 Anxiety disorder, unspecified: Secondary | ICD-10-CM

## 2018-07-27 MED ORDER — BENZTROPINE MESYLATE 1 MG PO TABS: 1.0000 mg | ORAL_TABLET | Freq: Every day | ORAL | 1 refills | Status: DC

## 2018-07-27 MED ORDER — ALPRAZOLAM 0.5 MG PO TABS: 0.5000 mg | ORAL_TABLET | Freq: Two times a day (BID) | ORAL | 1 refills | Status: DC | PRN

## 2018-07-27 MED ORDER — OXCARBAZEPINE 600 MG PO TABS: 600.0000 mg | ORAL_TABLET | Freq: Three times a day (TID) | ORAL | 1 refills | Status: DC

## 2018-07-27 MED ORDER — BUPROPION HCL ER (XL) 300 MG PO TB24: ORAL_TABLET | ORAL | 0 refills | Status: DC

## 2018-07-27 MED ORDER — QUETIAPINE FUMARATE 300 MG PO TABS: ORAL_TABLET | ORAL | 1 refills | Status: DC

## 2018-07-27 NOTE — Progress Notes (Signed)
[facility]  Patient ID: Casey [name],  MRN: [id number]  PCP: Sharilyn Sites, MD  Date of Evaluation: 07/27/2018 Time spent:25 minutes  Chief Complaint:   HISTORY/CURRENT STATUS: HPI patient last seen 06/24/2018.  Diagnosis of bipolar disorder.  No change in medication. Since then he feels like he has had no depression or manic symptoms.  May have some anxiety.  Concerned Concerned about his legs feeling weak and hand tremors.   Individual Medical History/ Review of Systems: Changes? :No   Allergies: Amlodipine and Vicodin [hydrocodone-acetaminophen]  Current Medications:  Current Outpatient Medications:  .  ALPRAZolam (XANAX) 0.5 MG tablet, Take 1 tablet (0.5 mg total) by mouth 2 (two) times daily as needed for anxiety., Disp: 20 tablet, Rfl: 1 .  buPROPion (WELLBUTRIN XL) 300 MG 24 hr tablet, 1 tab daily, Disp: 30 tablet, Rfl: 0 .  oxcarbazepine (TRILEPTAL) 600 MG tablet, Take 1 tablet (600 mg total) by mouth 3 (three) times daily., Disp: 90 tablet, Rfl: 1 .  QUEtiapine (SEROQUEL) 300 MG tablet, 1/2 tab at bed for 1 week then 1 whole tab at bed, Disp: 30 tablet, Rfl: 1 .  acetaminophen (TYLENOL) 500 MG tablet, Take 1,000 mg by mouth every 6 (six) hours as needed for headache., Disp: , Rfl:  .  Albuterol Sulfate 108 (90 Base) MCG/ACT AEPB, Inhale 1-2 puffs into the lungs as needed. , Disp: , Rfl:  .  benztropine (COGENTIN) 1 MG tablet, Take 1 tablet (1 mg total) by mouth daily., Disp: 30 tablet, Rfl: 1 .  cefdinir (OMNICEF) 300 MG capsule, Take 1 capsule (300 mg total) by mouth 2 (two) times daily., Disp: 6 capsule, Rfl: 0 .  cholecalciferol (VITAMIN D) 1000 UNITS tablet, Take 2,000 Units by mouth daily., Disp: , Rfl:  .  fluticasone (FLONASE) 50 MCG/ACT nasal spray, Place 2 sprays into both nostrils daily., Disp: , Rfl: 11 .  Lactobacillus (DIGESTIVE HEALTH PROBIOTIC) CAPS, Take 1 tablet by mouth daily., Disp: , Rfl:  .  lamoTRIgine (LAMICTAL) 150 MG tablet, Take 150  mg by mouth daily., Disp: , Rfl:  .  levothyroxine (SYNTHROID, LEVOTHROID) 25 MCG tablet, Take 25 mcg by mouth daily before breakfast. , Disp: , Rfl:  .  ondansetron (ZOFRAN) 4 MG tablet, Take 1 tablet (4 mg total) by mouth 4 (four) times daily -  before meals and at bedtime., Disp: 90 tablet, Rfl: 2 .  ondansetron (ZOFRAN-ODT) 4 MG disintegrating tablet, Take 1 tablet by mouth 3 (three) times daily as needed., Disp: , Rfl: 2 .  pantoprazole (PROTONIX) 40 MG tablet, TAKE ONE TABLET BY MOUTH ONCE DAILY, Disp: 30 tablet, Rfl: 11 .  rivaroxaban (XARELTO) 20 MG TABS tablet, Take 20 mg by mouth daily after supper. , Disp: , Rfl:  .  vitamin B-12 (CYANOCOBALAMIN) 500 MCG tablet, Take 500 mcg by mouth daily., Disp: , Rfl:  .  Wheat Dextrin (BENEFIBER PO), Take 1 Dose by mouth as needed (constapation). , Disp: , Rfl:  Medication Side Effects: none  Family Medical/ Social History: Changes?no  MENTAL HEALTH EXAM:  There were no vitals taken for this visit.There is no height or weight on file to calculate BMI.  General Appearance: Casual  Eye Contact:  Good  Speech:  Clear and Coherent  Volume:  Normal  Mood:  Euthymic  Affect:  Appropriate  Thought Process:  Linear  Orientation:  Full (Time, Place, and Person)  Thought Content: Logical   Suicidal Thoughts:  No  Homicidal Thoughts:  No  Memory:  WNL  Judgement:  Good  Insight:  Good  Psychomotor Activity:  Normal  Concentration:  Concentration: Good  Recall:  Good  Fund of Knowledge: Good  Language: Good  Assets:  Desire for Improvement  ADL's:  Intact  Cognition: WNL  Prognosis:  Good  Orthostatic bp/pulse Sitting 138/81 with pulse 77 Standing and waiting 1 minute  1225/66  Pulse 82  DIAGNOSES:    ICD-10-CM   1. Bipolar affective disorder, currently depressed, mild ([place]) F31.31   2. Anxiety F41.9     Receiving Psychotherapy: No    RECOMMENDATIONS: Patient is to continue his usual medications.  He will start ginkgo biloba 120  mg twice daily poor tongue movement and left foot movement.  He will also start Cogentin 1 mg a day for tremor we will also follow the Trileptal side effects. Just got his lipids and Trileptal 3 days ago,results pending Return in 6 weeks.   Comer Locket, PA-C

## 2018-07-28 LAB — 10-HYDROXYCARBAZEPINE: OXCARBAZEPINE SERPL-MCNC: 33 ug/mL (ref 10–35)

## 2018-07-28 LAB — AMMONIA: Ammonia: 34 ug/dL (ref 27–102)

## 2018-07-30 ENCOUNTER — Encounter (HOSPITAL_COMMUNITY): Payer: Self-pay

## 2018-07-30 ENCOUNTER — Emergency Department (HOSPITAL_COMMUNITY)
Admission: EM | Admit: 2018-07-30 | Discharge: 2018-07-30 | Disposition: A | Discharge status: Home / Self Care | Payer: Medicare Other | Attending: Emergency Medicine | Admitting: Emergency Medicine

## 2018-07-30 ENCOUNTER — Other Ambulatory Visit: Payer: Self-pay

## 2018-07-30 ENCOUNTER — Emergency Department (HOSPITAL_COMMUNITY): Payer: Medicare Other

## 2018-07-30 DIAGNOSIS — Y999 Unspecified external cause status: Secondary | ICD-10-CM | POA: Insufficient documentation

## 2018-07-30 DIAGNOSIS — Y939 Activity, unspecified: Secondary | ICD-10-CM | POA: Insufficient documentation

## 2018-07-30 DIAGNOSIS — J449 Chronic obstructive pulmonary disease, unspecified: Secondary | ICD-10-CM | POA: Insufficient documentation

## 2018-07-30 DIAGNOSIS — Z79899 Other long term (current) drug therapy: Secondary | ICD-10-CM | POA: Insufficient documentation

## 2018-07-30 DIAGNOSIS — R42 Dizziness and giddiness: Secondary | ICD-10-CM | POA: Diagnosis not present

## 2018-07-30 DIAGNOSIS — S0181XA Laceration without foreign body of other part of head, initial encounter: Secondary | ICD-10-CM | POA: Insufficient documentation

## 2018-07-30 DIAGNOSIS — I129 Hypertensive chronic kidney disease with stage 1 through stage 4 chronic kidney disease, or unspecified chronic kidney disease: Secondary | ICD-10-CM | POA: Diagnosis not present

## 2018-07-30 DIAGNOSIS — W19XXXA Unspecified fall, initial encounter: Secondary | ICD-10-CM

## 2018-07-30 DIAGNOSIS — S0101XA Laceration without foreign body of scalp, initial encounter: Secondary | ICD-10-CM | POA: Diagnosis not present

## 2018-07-30 DIAGNOSIS — E039 Hypothyroidism, unspecified: Secondary | ICD-10-CM | POA: Insufficient documentation

## 2018-07-30 DIAGNOSIS — Y92008 Other place in unspecified non-institutional (private) residence as the place of occurrence of the external cause: Secondary | ICD-10-CM | POA: Diagnosis not present

## 2018-07-30 DIAGNOSIS — N189 Chronic kidney disease, unspecified: Secondary | ICD-10-CM | POA: Insufficient documentation

## 2018-07-30 DIAGNOSIS — W1789XA Other fall from one level to another, initial encounter: Secondary | ICD-10-CM | POA: Diagnosis not present

## 2018-07-30 DIAGNOSIS — S2242XA Multiple fractures of ribs, left side, initial encounter for closed fracture: Secondary | ICD-10-CM

## 2018-07-30 DIAGNOSIS — R52 Pain, unspecified: Secondary | ICD-10-CM | POA: Diagnosis not present

## 2018-07-30 DIAGNOSIS — R0689 Other abnormalities of breathing: Secondary | ICD-10-CM | POA: Diagnosis not present

## 2018-07-30 DIAGNOSIS — S199XXA Unspecified injury of neck, initial encounter: Secondary | ICD-10-CM | POA: Diagnosis not present

## 2018-07-30 DIAGNOSIS — I4891 Unspecified atrial fibrillation: Secondary | ICD-10-CM | POA: Diagnosis not present

## 2018-07-30 DIAGNOSIS — S0990XA Unspecified injury of head, initial encounter: Secondary | ICD-10-CM | POA: Diagnosis not present

## 2018-07-30 DIAGNOSIS — Z7901 Long term (current) use of anticoagulants: Secondary | ICD-10-CM | POA: Diagnosis not present

## 2018-07-30 DIAGNOSIS — R0902 Hypoxemia: Secondary | ICD-10-CM | POA: Diagnosis not present

## 2018-07-30 MED ORDER — OXYCODONE-ACETAMINOPHEN 5-325 MG PO TABS: 1.0000 | ORAL_TABLET | ORAL | 0 refills | Status: DC | PRN

## 2018-07-30 MED ORDER — HYDROMORPHONE HCL 1 MG/ML IJ SOLN
1.0000 mg | Freq: Once | INTRAMUSCULAR | Status: AC
  Administered 2018-07-30: 1 mg via INTRAMUSCULAR
  Filled 2018-07-30: qty 1

## 2018-07-30 MED ORDER — HYDROMORPHONE HCL 1 MG/ML IJ SOLN
0.5000 mg | Freq: Once | INTRAMUSCULAR | Status: AC
  Administered 2018-07-30: 0.5 mg via INTRAMUSCULAR
  Filled 2018-07-30: qty 1

## 2018-07-30 NOTE — ED Notes (Signed)
Patient transported to CT 

## 2018-07-30 NOTE — Discharge Instructions (Addendum)
You have a 2 fractured ribs on your left back side.  Additionally you may have 1 broken rib on the left front side.  You will be sore for many weeks.  Prescription for pain medicine.  Staples out in 7 days.  Return for any concerns.  Make sure you cough and deep breathe.

## 2018-07-30 NOTE — ED Provider Notes (Signed)
[facility] EMERGENCY DEPARTMENT Provider Note   CSN: [id number] Arrival date & time: 07/30/18  1658     History   Chief Complaint Chief Complaint  Patient presents with  . Fall  . Laceration  . Rib Injury    HPI Casey [name] is a 73 y.o. male.  Level 5 caveat for acuity of condition.  Patient got dizzy earlier today and fell striking the left side of his head and his left upper back.  Patient has multiple health problems including atrial fibrillation, bipolar disorder, chronic pain, COPD, depression, hypertension, obesity, several others.  He now has a laceration on his left parietal occipital area.     Past Medical History:  Diagnosis Date  . Arthritis   . Atrial fibrillation ([place]) 01/12/2010   2D Echo EF=>55%  . Bipolar disorder ([place])   . Chronic back pain   . COPD (chronic obstructive pulmonary disease) ([name])   . Depression   . Dysrhythmia    AFib  . History of colonic polyps   . History of gout   . Hyperlipidemia   . Hypertension   . Hypothyroidism   . Morbid obesity ([place])   . OSA (obstructive sleep apnea)    AHI was 27.62hr RDI was 34.3 hr REM 0.00hr; had CPAP years ago but no longer uses.  . Prostatitis   . Tubular adenoma of colon 01/2016    Patient Active Problem List   Diagnosis Date Noted  . Atrial fibrillation with RVR ([place])   . Sepsis ([place]) 06/13/2018  . Acute metabolic encephalopathy 30/86/5784  . Thrombocytopenia ([place]) 06/13/2018  . Renal insufficiency 06/13/2018  . Anxiety 06/12/2018  . Bipolar disorder ([place]) 06/12/2018  . Chronic anticoagulation 05/05/2017  . Sleep apnea 05/05/2017  . Edema leg 05/05/2017  . Dysphasia 02/07/2017  . Mild obesity 05/10/2016  . History of colonic polyps   . Diverticulosis of colon without hemorrhage   . Hyperlipidemia LDL goal <130 05/16/2013  . KNEE PAIN 08/07/2009  . KNEE SPRAIN, ACUTE 08/07/2009  . COLONIC POLYPS, HX OF 05/10/2009  . Hypothyroidism 05/08/2009  . History of bipolar disorder  05/08/2009  . Essential hypertension 05/08/2009  . Permanent atrial fibrillation 05/08/2009  . HEMATOCHEZIA 05/08/2009  . LOW BACK PAIN, CHRONIC 05/08/2009  . ABDOMINAL PAIN 05/08/2009    Past Surgical History:  Procedure Laterality Date  . APPENDECTOMY  1959  . asthma    . CATARACT EXTRACTION Bilateral 1995  . COLONOSCOPY  2011   RMR: left-sided diverticula, minimal rectal friability. No polyps. Repeat 5 years.  . COLONOSCOPY WITH PROPOFOL N/A 01/15/2016   Dr.Rourk- diverticulosis in the entire examined colon, multiple rectal and colonic polyps, internal hemorrhoids. bx= tubular adenomas and hyperplastic polyps.   Marland Kitchen HAND / FINGER LESION EXCISION Right   . POLYPECTOMY  01/15/2016   Procedure: POLYPECTOMY;  Surgeon: Daneil Dolin, MD;  Location: [facility] SUITE;  Service: Endoscopy;;  . right shoulder surgery    . TONSILLECTOMY  1955        Home Medications    Prior to Admission medications   Medication Sig Start Date End Date Taking? Authorizing Provider  acetaminophen (TYLENOL) 500 MG tablet Take 1,000 mg by mouth every 6 (six) hours as needed for headache.    [provider]  Albuterol Sulfate 108 (90 Base) MCG/ACT AEPB Inhale 1-2 puffs into the lungs as needed.     [provider]  ALPRAZolam Duanne Moron) 0.5 MG tablet Take 1 tablet (0.5 mg total) by mouth 2 (  two) times daily as needed for anxiety. 07/27/18   Shugart, Lissa Hoard, PA-C  benztropine (COGENTIN) 1 MG tablet Take 1 tablet (1 mg total) by mouth daily. 07/27/18   Shugart, Lissa Hoard, PA-C  buPROPion (WELLBUTRIN XL) 300 MG 24 hr tablet 1 tab daily 07/27/18   Shugart, Lissa Hoard, PA-C  cefdinir (OMNICEF) 300 MG capsule Take 1 capsule (300 mg total) by mouth 2 (two) times daily. 06/17/18   Orson Eva, MD  cholecalciferol (VITAMIN D) 1000 UNITS tablet Take 2,000 Units by mouth daily.    [provider]  fluticasone (FLONASE) 50 MCG/ACT nasal spray Place 2 sprays into both nostrils daily. 05/19/18   [provider]    Lactobacillus (DIGESTIVE HEALTH PROBIOTIC) CAPS Take 1 tablet by mouth daily.    [provider]  lamoTRIgine (LAMICTAL) 150 MG tablet Take 150 mg by mouth daily. 04/13/18   [provider]  levothyroxine (SYNTHROID, LEVOTHROID) 25 MCG tablet Take 25 mcg by mouth daily before breakfast.  04/06/15   [provider]  ondansetron (ZOFRAN) 4 MG tablet Take 1 tablet (4 mg total) by mouth 4 (four) times daily -  before meals and at bedtime. 06/30/18   Carlis Stable, NP  ondansetron (ZOFRAN-ODT) 4 MG disintegrating tablet Take 1 tablet by mouth 3 (three) times daily as needed. 05/19/18   [provider]  oxcarbazepine (TRILEPTAL) 600 MG tablet Take 1 tablet (600 mg total) by mouth 3 (three) times daily. 07/27/18   Shugart, Lissa Hoard, PA-C  oxyCODONE-acetaminophen (PERCOCET) 5-325 MG tablet Take 1 tablet by mouth every 4 (four) hours as needed. 07/30/18   Nat Christen, MD  oxyCODONE-acetaminophen (PERCOCET) 5-325 MG tablet Take 1 tablet by mouth every 4 (four) hours as needed. 07/30/18   Nat Christen, MD  pantoprazole (PROTONIX) 40 MG tablet TAKE ONE TABLET BY MOUTH ONCE DAILY 03/07/17   Carlis Stable, NP  QUEtiapine (SEROQUEL) 300 MG tablet 1/2 tab at bed for 1 week then 1 whole tab at bed 07/27/18   Shugart, Lissa Hoard, PA-C  rivaroxaban (XARELTO) 20 MG TABS tablet Take 20 mg by mouth daily after supper.     [provider]  vitamin B-12 (CYANOCOBALAMIN) 500 MCG tablet Take 500 mcg by mouth daily.    [provider]  Wheat Dextrin (BENEFIBER PO) Take 1 Dose by mouth as needed (constapation).     [provider]    Family History Family History  Problem Relation Age of Onset  . Diabetes Mother   . Heart failure Mother   . Hypertension Mother   . Hypertension Father   . Cancer Paternal Grandmother   . Stroke Sister   . Colon cancer Neg Hx     Social History Social History   Tobacco Use  . Smoking status: Never Smoker  . Smokeless tobacco: Never Used   Substance Use Topics  . Alcohol use: No    Alcohol/week: 0.0 standard drinks  . Drug use: No     Allergies   Amlodipine and Vicodin [hydrocodone-acetaminophen]   Review of Systems Review of Systems  Unable to perform ROS: Acuity of condition     Physical Exam Updated Vital Signs BP (!) 136/102   Pulse (!) 105   Temp 98 F (36.7 C) (Oral)   Resp (!) 27   Ht 6' (1.829 m)   SpO2 94%   BMI 31.04 kg/m   Physical Exam Vitals signs and nursing note reviewed.  Constitutional:      Appearance: He is well-developed.  HENT:  Head: Normocephalic.     Comments: 3.0 cm laceration on left occipital parietal area Eyes:     Conjunctiva/sclera: Conjunctivae normal.  Neck:     Musculoskeletal: Neck supple.  Cardiovascular:     Rate and Rhythm: Normal rate and regular rhythm.  Pulmonary:     Effort: Pulmonary effort is normal.     Breath sounds: Normal breath sounds.     Comments: Tender mid upper left posterior ribs Abdominal:     General: Bowel sounds are normal.     Palpations: Abdomen is soft.  Musculoskeletal: Normal range of motion.  Skin:    General: Skin is warm and dry.  Neurological:     Mental Status: He is alert and oriented to person, place, and time.  Psychiatric:        Behavior: Behavior normal.      ED Treatments / Results  Labs (all labs ordered are listed, but only abnormal results are displayed) Labs Reviewed - No data to display  EKG EKG Interpretation  Date/Time:  Thursday July 30 2018 17:03:18 EST Ventricular Rate:  97 PR Interval:    QRS Duration: 90 QT Interval:  337 QTC Calculation: 428 R Axis:   47 Text Interpretation:  Atrial fibrillation Confirmed by Nat Christen [phone]) on 07/30/2018 8:35:45 PM   Radiology Dg Ribs Unilateral W/chest Left  Result Date: 07/30/2018 CLINICAL DATA:  Patient with dizziness. Fall. Anterior left rib pain. EXAM: LEFT RIBS AND CHEST - 3+ VIEW COMPARISON:  Chest radiograph 06/13/2018 FINDINGS:  Stable cardiomegaly. Asymmetric left-greater-than-right interstitial opacities. No pleural effusion or pneumothorax. On the oblique view there is suggestion of a possible nondisplaced fracture through the anterior left seventh rib. Additionally nondisplaced posterior left seventh and sixth rib fractures. No left-sided pneumothorax. Thoracic spine degenerative changes. IMPRESSION: Posterior nondisplaced left seventh and sixth rib fractures. Possible nondisplaced anterior left seventh rib fracture. Left-greater-than-right asymmetric interstitial opacities may represent edema or infection. Electronically Signed   By: Lovey Newcomer M.D.   On: 07/30/2018 19:09   Ct Head Wo Contrast  Result Date: 07/30/2018 CLINICAL DATA:  Patient status post fall from porch. EXAM: CT HEAD WITHOUT CONTRAST CT CERVICAL SPINE WITHOUT CONTRAST TECHNIQUE: Multidetector CT imaging of the head and cervical spine was performed following the standard protocol without intravenous contrast. Multiplanar CT image reconstructions of the cervical spine were also generated. COMPARISON:  Brain CT 06/13/2018 FINDINGS: CT HEAD FINDINGS Brain: Ventricles and sulci are prominent compatible with atrophy. Periventricular and subcortical white matter hypodensity compatible with chronic microvascular ischemic changes. No evidence for acute cortically based infarct, intracranial hemorrhage, mass lesion or mass-effect. Vascular: Unremarkable Skull: Intact. Sinuses/Orbits: Paranasal sinuses are well aerated. Mastoid air cells are unremarkable. Orbits are unremarkable. Other: Soft tissue swelling and laceration overlying the posterior left calvarium. CT CERVICAL SPINE FINDINGS Alignment: Normal anatomic alignment. Skull base and vertebrae: No acute fracture. No primary bone lesion or focal pathologic process. Soft tissues and spinal canal: No prevertebral fluid or swelling. No visible canal hematoma. Disc levels: Anterior endplate osteophytosis N4-6 and C6-7.  Preservation the vertebral body heights. No acute fracture. Upper chest: Unremarkable Other: None. IMPRESSION: No acute intracranial process. Soft tissue swelling//aeration overlying the posterior left calvarium. No acute cervical spine fracture.  Degenerative changes. Electronically Signed   By: Lovey Newcomer M.D.   On: 07/30/2018 19:56   Ct Cervical Spine Wo Contrast  Result Date: 07/30/2018 CLINICAL DATA:  Patient status post fall from porch. EXAM: CT HEAD WITHOUT CONTRAST CT CERVICAL SPINE WITHOUT CONTRAST TECHNIQUE:  Multidetector CT imaging of the head and cervical spine was performed following the standard protocol without intravenous contrast. Multiplanar CT image reconstructions of the cervical spine were also generated. COMPARISON:  Brain CT 06/13/2018 FINDINGS: CT HEAD FINDINGS Brain: Ventricles and sulci are prominent compatible with atrophy. Periventricular and subcortical white matter hypodensity compatible with chronic microvascular ischemic changes. No evidence for acute cortically based infarct, intracranial hemorrhage, mass lesion or mass-effect. Vascular: Unremarkable Skull: Intact. Sinuses/Orbits: Paranasal sinuses are well aerated. Mastoid air cells are unremarkable. Orbits are unremarkable. Other: Soft tissue swelling and laceration overlying the posterior left calvarium. CT CERVICAL SPINE FINDINGS Alignment: Normal anatomic alignment. Skull base and vertebrae: No acute fracture. No primary bone lesion or focal pathologic process. Soft tissues and spinal canal: No prevertebral fluid or swelling. No visible canal hematoma. Disc levels: Anterior endplate osteophytosis Q4-6 and C6-7. Preservation the vertebral body heights. No acute fracture. Upper chest: Unremarkable Other: None. IMPRESSION: No acute intracranial process. Soft tissue swelling//aeration overlying the posterior left calvarium. No acute cervical spine fracture.  Degenerative changes. Electronically Signed   By: Lovey Newcomer M.D.    On: 07/30/2018 19:56    Procedures .Marland KitchenLaceration Repair Date/Time: 07/30/2018 7:45 PM Performed by: Nat Christen, MD Authorized by: Nat Christen, MD   Consent:    Consent obtained:  Verbal   Risks discussed:  Pain Anesthesia (see MAR for exact dosages):    Anesthesia method:  None Laceration details:    Location:  Scalp   Scalp location:  L parietal   Wound length (cm): 3.0. Repair type:    Repair type:  Simple Pre-procedure details:    Preparation:  Imaging obtained to evaluate for foreign bodies Exploration:    Wound exploration: wound explored through full range of motion     Contaminated: no   Treatment:    Area cleansed with:  Saline   Amount of cleaning:  Standard   Visualized foreign bodies/material removed: no   Skin repair:    Repair method:  Staples Approximation:    Approximation:  Close Post-procedure details:    Dressing:  Bulky dressing   Patient tolerance of procedure:  Tolerated well, no immediate complications   (including critical care time)  Medications Ordered in ED Medications  HYDROmorphone (DILAUDID) injection 0.5 mg (0.5 mg Intramuscular Given 07/30/18 1911)  HYDROmorphone (DILAUDID) injection 1 mg (1 mg Intramuscular Given 07/30/18 2044)     Initial Impression / Assessment and Plan / ED Course  I have reviewed the triage vital signs and the nursing notes.  Pertinent labs & imaging results that were available during my care of the patient were reviewed by me and considered in my medical decision making (see chart for details).     Status post fall earlier today.  Patient is neurologically intact.  CT head and CT cervical spine show no acute findings.  However, chest x-ray reveals left #6 and #7 left posterior and possible left #7 anterior rib fractures.  Laceration repair via staples.  All test results were discussed with the patient and his family.  Discharge medication Percocet.  Final Clinical Impressions(s) / ED Diagnoses   Final  diagnoses:  Fall, initial encounter  Laceration of scalp, initial encounter  Closed fracture of multiple ribs of left side, initial encounter    ED Discharge Orders         Ordered    oxyCODONE-acetaminophen (PERCOCET) 5-325 MG tablet  Every 4 hours PRN     07/30/18 2054    oxyCODONE-acetaminophen (PERCOCET) 5-325 MG  tablet  Every 4 hours PRN     07/30/18 2055           Nat Christen, MD 07/30/18 2138

## 2018-07-30 NOTE — ED Triage Notes (Signed)
Pt brought in by EMS. Pt was dizzy and fell back off porch approx 3 feet. Pt landed in flower garden with border.Pt reports dizziness since medication changes. Pt has laceration to the back of head and complaining of left sided rib pain. Pt said he did lose consciousness. Pt is on xaretlo

## 2018-07-30 NOTE — ED Notes (Signed)
Patient transported to X-ray 

## 2018-07-31 ENCOUNTER — Telehealth: Payer: Self-pay

## 2018-07-31 MED FILL — Oxycodone w/ Acetaminophen Tab 5-325 MG: ORAL | Qty: 6 | Status: AC

## 2018-07-31 NOTE — Telephone Encounter (Signed)
Called to give pt lab results, but spoke with wife Butch Penny instead. Pt fell yesterday off the porch and received 6 staples to his head and broke 2 ribs in his back. C/o feeling weak when he was standing. Wanted to make provider aware due to medication changes a few days ago.  Let her also know his labs were normal.

## 2018-08-10 DIAGNOSIS — S2232XD Fracture of one rib, left side, subsequent encounter for fracture with routine healing: Secondary | ICD-10-CM | POA: Diagnosis not present

## 2018-08-10 DIAGNOSIS — F3162 Bipolar disorder, current episode mixed, moderate: Secondary | ICD-10-CM | POA: Diagnosis not present

## 2018-08-10 DIAGNOSIS — E6609 Other obesity due to excess calories: Secondary | ICD-10-CM | POA: Diagnosis not present

## 2018-08-10 DIAGNOSIS — E039 Hypothyroidism, unspecified: Secondary | ICD-10-CM | POA: Diagnosis not present

## 2018-08-10 DIAGNOSIS — Z4802 Encounter for removal of sutures: Secondary | ICD-10-CM | POA: Diagnosis not present

## 2018-08-10 DIAGNOSIS — M1991 Primary osteoarthritis, unspecified site: Secondary | ICD-10-CM | POA: Diagnosis not present

## 2018-08-10 DIAGNOSIS — Z6831 Body mass index (BMI) 31.0-31.9, adult: Secondary | ICD-10-CM | POA: Diagnosis not present

## 2018-08-11 ENCOUNTER — Ambulatory Visit: Payer: Medicare Other | Admitting: Internal Medicine

## 2018-08-23 ENCOUNTER — Other Ambulatory Visit: Payer: Self-pay | Admitting: Psychiatry

## 2018-09-05 DIAGNOSIS — Z79899 Other long term (current) drug therapy: Secondary | ICD-10-CM | POA: Diagnosis not present

## 2018-09-05 DIAGNOSIS — Z9181 History of falling: Secondary | ICD-10-CM | POA: Diagnosis not present

## 2018-09-05 DIAGNOSIS — I4891 Unspecified atrial fibrillation: Secondary | ICD-10-CM | POA: Diagnosis not present

## 2018-09-05 DIAGNOSIS — M109 Gout, unspecified: Secondary | ICD-10-CM | POA: Diagnosis not present

## 2018-09-05 DIAGNOSIS — M1991 Primary osteoarthritis, unspecified site: Secondary | ICD-10-CM | POA: Diagnosis not present

## 2018-09-05 DIAGNOSIS — Z6832 Body mass index (BMI) 32.0-32.9, adult: Secondary | ICD-10-CM | POA: Diagnosis not present

## 2018-09-05 DIAGNOSIS — R2681 Unsteadiness on feet: Secondary | ICD-10-CM | POA: Diagnosis not present

## 2018-09-05 DIAGNOSIS — I10 Essential (primary) hypertension: Secondary | ICD-10-CM | POA: Diagnosis not present

## 2018-09-05 DIAGNOSIS — Z8781 Personal history of (healed) traumatic fracture: Secondary | ICD-10-CM | POA: Diagnosis not present

## 2018-09-05 DIAGNOSIS — E6609 Other obesity due to excess calories: Secondary | ICD-10-CM | POA: Diagnosis not present

## 2018-09-05 DIAGNOSIS — F3162 Bipolar disorder, current episode mixed, moderate: Secondary | ICD-10-CM | POA: Diagnosis not present

## 2018-09-05 DIAGNOSIS — Z7901 Long term (current) use of anticoagulants: Secondary | ICD-10-CM | POA: Diagnosis not present

## 2018-09-05 DIAGNOSIS — E038 Other specified hypothyroidism: Secondary | ICD-10-CM | POA: Diagnosis not present

## 2018-09-08 ENCOUNTER — Ambulatory Visit (INDEPENDENT_AMBULATORY_CARE_PROVIDER_SITE_OTHER): Payer: Medicare Other | Admitting: Psychiatry

## 2018-09-08 DIAGNOSIS — F3131 Bipolar disorder, current episode depressed, mild: Secondary | ICD-10-CM

## 2018-09-08 MED ORDER — BENZTROPINE MESYLATE 1 MG PO TABS: ORAL_TABLET | ORAL | 1 refills | Status: DC

## 2018-09-08 MED ORDER — OXCARBAZEPINE 600 MG PO TABS: 600.0000 mg | ORAL_TABLET | Freq: Three times a day (TID) | ORAL | 1 refills | Status: DC

## 2018-09-08 MED ORDER — ALPRAZOLAM 0.5 MG PO TABS: 0.5000 mg | ORAL_TABLET | Freq: Two times a day (BID) | ORAL | 1 refills | Status: DC | PRN

## 2018-09-08 MED ORDER — QUETIAPINE FUMARATE 100 MG PO TABS: ORAL_TABLET | ORAL | 0 refills | Status: DC

## 2018-09-08 MED ORDER — BUPROPION HCL ER (XL) 300 MG PO TB24: ORAL_TABLET | ORAL | 0 refills | Status: DC

## 2018-09-08 NOTE — Progress Notes (Signed)
[facility]  Patient ID: Casey [name],  MRN: [id number]  PCP: Sharilyn Sites, MD  Date of Evaluation: 09/08/2018 Time spent:30 minutes  Chief Complaint:   HISTORY/CURRENT STATUS: HPI patient last seen 07/27/2018.  At that time he was doing so-so.  He feels he is doing about the same.  He has some depression and some isolation.  He also has some anxiety but no panic attacks.    Individual Medical History/ Review of Systems: Changes? :No   Allergies: Amlodipine and Vicodin [hydrocodone-acetaminophen]  Current Medications:  Current Outpatient Medications:  .  ALPRAZolam (XANAX) 0.5 MG tablet, Take 1 tablet (0.5 mg total) by mouth 2 (two) times daily as needed for anxiety., Disp: 30 tablet, Rfl: 1 .  benztropine (COGENTIN) 1 MG tablet, 1 and 1/2 tabs/day for a week, then 2 tabs/day, Disp: 60 tablet, Rfl: 1 .  buPROPion (WELLBUTRIN XL) 300 MG 24 hr tablet, TAKE 1 TABLET BY MOUTH ONCE DAILY, Disp: 30 tablet, Rfl: 0 .  QUEtiapine (SEROQUEL) 100 MG tablet, 1 pill a day for a week, then stop, Disp: 7 tablet, Rfl: 0 .  acetaminophen (TYLENOL) 500 MG tablet, Take 1,000 mg by mouth every 6 (six) hours as needed for headache., Disp: , Rfl:  .  Albuterol Sulfate 108 (90 Base) MCG/ACT AEPB, Inhale 1-2 puffs into the lungs as needed. , Disp: , Rfl:  .  cefdinir (OMNICEF) 300 MG capsule, Take 1 capsule (300 mg total) by mouth 2 (two) times daily., Disp: 6 capsule, Rfl: 0 .  cholecalciferol (VITAMIN D) 1000 UNITS tablet, Take 2,000 Units by mouth daily., Disp: , Rfl:  .  fluticasone (FLONASE) 50 MCG/ACT nasal spray, Place 2 sprays into both nostrils daily., Disp: , Rfl: 11 .  Lactobacillus (DIGESTIVE HEALTH PROBIOTIC) CAPS, Take 1 tablet by mouth daily., Disp: , Rfl:  .  lamoTRIgine (LAMICTAL) 150 MG tablet, Take 150 mg by mouth daily., Disp: , Rfl:  .  levothyroxine (SYNTHROID, LEVOTHROID) 25 MCG tablet, Take 25 mcg by mouth daily before breakfast. , Disp: , Rfl:  .  ondansetron (ZOFRAN)  4 MG tablet, Take 1 tablet (4 mg total) by mouth 4 (four) times daily -  before meals and at bedtime., Disp: 90 tablet, Rfl: 2 .  ondansetron (ZOFRAN-ODT) 4 MG disintegrating tablet, Take 1 tablet by mouth 3 (three) times daily as needed., Disp: , Rfl: 2 .  oxcarbazepine (TRILEPTAL) 600 MG tablet, Take 1 tablet (600 mg total) by mouth 3 (three) times daily., Disp: 90 tablet, Rfl: 1 .  oxyCODONE-acetaminophen (PERCOCET) 5-325 MG tablet, Take 1 tablet by mouth every 4 (four) hours as needed., Disp: 6 tablet, Rfl: 0 .  oxyCODONE-acetaminophen (PERCOCET) 5-325 MG tablet, Take 1 tablet by mouth every 4 (four) hours as needed., Disp: 20 tablet, Rfl: 0 .  pantoprazole (PROTONIX) 40 MG tablet, TAKE ONE TABLET BY MOUTH ONCE DAILY, Disp: 30 tablet, Rfl: 11 .  rivaroxaban (XARELTO) 20 MG TABS tablet, Take 20 mg by mouth daily after supper. , Disp: , Rfl:  .  vitamin B-12 (CYANOCOBALAMIN) 500 MCG tablet, Take 500 mcg by mouth daily., Disp: , Rfl:  .  Wheat Dextrin (BENEFIBER PO), Take 1 Dose by mouth as needed (constapation). , Disp: , Rfl:  Medication Side Effects: none  Family Medical/ Social History: Changes? no  MENTAL HEALTH EXAM: pt with slow gait, poor arm swing, no upper extremiety cogwheeling.  There were no vitals taken for this visit.There is no height or weight on file to calculate BMI.  General Appearance: Casual  Eye Contact:  Good  Speech:  Clear and Coherent  Volume:  Normal  Mood:  Depressed  Affect:  Appropriate  Thought Process:  Linear  Orientation:  Full (Time, Place, and Person)  Thought Content: Logical   Suicidal Thoughts:  No  Homicidal Thoughts:  No  Memory:  WNL  Judgement:  Good  Insight:  Fair  Psychomotor Activity:  Normal  Concentration:  Concentration: Good  Recall:  Good  Fund of Knowledge: Good  Language: Good  Assets:  Desire for Improvement  ADL's:  Intact  Cognition: WNL  Prognosis:  Good    DIAGNOSES:    ICD-10-CM   1. Bipolar affective disorder,  currently depressed, mild ([place]) F31.31     Receiving Psychotherapy: No    RECOMMENDATIONS: Patient is to increase his ginkgo biloba to 120 mg twice daily, continue the Trileptal 600 mg 2 in the morning and 1 at night.  He will increase the cogentin to 1 and 1/2 pill.day for 1 week, then 2 /day  We will continue Wellbutrin XL 300 a day.  He is to go down to 100 mg of Seroquel for a week and then stop.  (Has A. fib on medication). Consider parkinsonism.  We stopped the Seroquel that should help with his parkinsonism.   Comer Locket, PA-C

## 2018-09-10 DIAGNOSIS — M1991 Primary osteoarthritis, unspecified site: Secondary | ICD-10-CM | POA: Diagnosis not present

## 2018-09-10 DIAGNOSIS — M109 Gout, unspecified: Secondary | ICD-10-CM | POA: Diagnosis not present

## 2018-09-10 DIAGNOSIS — I10 Essential (primary) hypertension: Secondary | ICD-10-CM | POA: Diagnosis not present

## 2018-09-10 DIAGNOSIS — R2681 Unsteadiness on feet: Secondary | ICD-10-CM | POA: Diagnosis not present

## 2018-09-10 DIAGNOSIS — I4891 Unspecified atrial fibrillation: Secondary | ICD-10-CM | POA: Diagnosis not present

## 2018-09-10 DIAGNOSIS — F3162 Bipolar disorder, current episode mixed, moderate: Secondary | ICD-10-CM | POA: Diagnosis not present

## 2018-09-11 DIAGNOSIS — I10 Essential (primary) hypertension: Secondary | ICD-10-CM | POA: Diagnosis not present

## 2018-09-11 DIAGNOSIS — M1991 Primary osteoarthritis, unspecified site: Secondary | ICD-10-CM | POA: Diagnosis not present

## 2018-09-11 DIAGNOSIS — F3162 Bipolar disorder, current episode mixed, moderate: Secondary | ICD-10-CM | POA: Diagnosis not present

## 2018-09-11 DIAGNOSIS — M109 Gout, unspecified: Secondary | ICD-10-CM | POA: Diagnosis not present

## 2018-09-11 DIAGNOSIS — R2681 Unsteadiness on feet: Secondary | ICD-10-CM | POA: Diagnosis not present

## 2018-09-11 DIAGNOSIS — I4891 Unspecified atrial fibrillation: Secondary | ICD-10-CM | POA: Diagnosis not present

## 2018-09-15 ENCOUNTER — Ambulatory Visit: Payer: Medicare Other | Admitting: Internal Medicine

## 2018-09-15 DIAGNOSIS — I10 Essential (primary) hypertension: Secondary | ICD-10-CM | POA: Diagnosis not present

## 2018-09-15 DIAGNOSIS — F3162 Bipolar disorder, current episode mixed, moderate: Secondary | ICD-10-CM | POA: Diagnosis not present

## 2018-09-15 DIAGNOSIS — M1991 Primary osteoarthritis, unspecified site: Secondary | ICD-10-CM | POA: Diagnosis not present

## 2018-09-15 DIAGNOSIS — M109 Gout, unspecified: Secondary | ICD-10-CM | POA: Diagnosis not present

## 2018-09-15 DIAGNOSIS — R2681 Unsteadiness on feet: Secondary | ICD-10-CM | POA: Diagnosis not present

## 2018-09-15 DIAGNOSIS — I4891 Unspecified atrial fibrillation: Secondary | ICD-10-CM | POA: Diagnosis not present

## 2018-09-16 DIAGNOSIS — M109 Gout, unspecified: Secondary | ICD-10-CM | POA: Diagnosis not present

## 2018-09-16 DIAGNOSIS — F3162 Bipolar disorder, current episode mixed, moderate: Secondary | ICD-10-CM | POA: Diagnosis not present

## 2018-09-16 DIAGNOSIS — M1991 Primary osteoarthritis, unspecified site: Secondary | ICD-10-CM | POA: Diagnosis not present

## 2018-09-16 DIAGNOSIS — R2681 Unsteadiness on feet: Secondary | ICD-10-CM | POA: Diagnosis not present

## 2018-09-17 DIAGNOSIS — F3162 Bipolar disorder, current episode mixed, moderate: Secondary | ICD-10-CM | POA: Diagnosis not present

## 2018-09-17 DIAGNOSIS — I4891 Unspecified atrial fibrillation: Secondary | ICD-10-CM | POA: Diagnosis not present

## 2018-09-17 DIAGNOSIS — I10 Essential (primary) hypertension: Secondary | ICD-10-CM | POA: Diagnosis not present

## 2018-09-17 DIAGNOSIS — R2681 Unsteadiness on feet: Secondary | ICD-10-CM | POA: Diagnosis not present

## 2018-09-17 DIAGNOSIS — M1991 Primary osteoarthritis, unspecified site: Secondary | ICD-10-CM | POA: Diagnosis not present

## 2018-09-17 DIAGNOSIS — M109 Gout, unspecified: Secondary | ICD-10-CM | POA: Diagnosis not present

## 2018-09-21 DIAGNOSIS — I10 Essential (primary) hypertension: Secondary | ICD-10-CM | POA: Diagnosis not present

## 2018-09-21 DIAGNOSIS — M1991 Primary osteoarthritis, unspecified site: Secondary | ICD-10-CM | POA: Diagnosis not present

## 2018-09-21 DIAGNOSIS — F3162 Bipolar disorder, current episode mixed, moderate: Secondary | ICD-10-CM | POA: Diagnosis not present

## 2018-09-21 DIAGNOSIS — R2681 Unsteadiness on feet: Secondary | ICD-10-CM | POA: Diagnosis not present

## 2018-09-21 DIAGNOSIS — M109 Gout, unspecified: Secondary | ICD-10-CM | POA: Diagnosis not present

## 2018-09-21 DIAGNOSIS — I4891 Unspecified atrial fibrillation: Secondary | ICD-10-CM | POA: Diagnosis not present

## 2018-09-23 DIAGNOSIS — R2681 Unsteadiness on feet: Secondary | ICD-10-CM | POA: Diagnosis not present

## 2018-09-23 DIAGNOSIS — I10 Essential (primary) hypertension: Secondary | ICD-10-CM | POA: Diagnosis not present

## 2018-09-23 DIAGNOSIS — I4891 Unspecified atrial fibrillation: Secondary | ICD-10-CM | POA: Diagnosis not present

## 2018-09-23 DIAGNOSIS — F3162 Bipolar disorder, current episode mixed, moderate: Secondary | ICD-10-CM | POA: Diagnosis not present

## 2018-09-23 DIAGNOSIS — M1991 Primary osteoarthritis, unspecified site: Secondary | ICD-10-CM | POA: Diagnosis not present

## 2018-09-23 DIAGNOSIS — M109 Gout, unspecified: Secondary | ICD-10-CM | POA: Diagnosis not present

## 2018-09-28 DIAGNOSIS — R2681 Unsteadiness on feet: Secondary | ICD-10-CM | POA: Diagnosis not present

## 2018-09-28 DIAGNOSIS — M109 Gout, unspecified: Secondary | ICD-10-CM | POA: Diagnosis not present

## 2018-09-28 DIAGNOSIS — I10 Essential (primary) hypertension: Secondary | ICD-10-CM | POA: Diagnosis not present

## 2018-09-28 DIAGNOSIS — I4891 Unspecified atrial fibrillation: Secondary | ICD-10-CM | POA: Diagnosis not present

## 2018-09-28 DIAGNOSIS — F3162 Bipolar disorder, current episode mixed, moderate: Secondary | ICD-10-CM | POA: Diagnosis not present

## 2018-09-28 DIAGNOSIS — M1991 Primary osteoarthritis, unspecified site: Secondary | ICD-10-CM | POA: Diagnosis not present

## 2018-10-02 DIAGNOSIS — M1991 Primary osteoarthritis, unspecified site: Secondary | ICD-10-CM | POA: Diagnosis not present

## 2018-10-02 DIAGNOSIS — R2681 Unsteadiness on feet: Secondary | ICD-10-CM | POA: Diagnosis not present

## 2018-10-02 DIAGNOSIS — I4891 Unspecified atrial fibrillation: Secondary | ICD-10-CM | POA: Diagnosis not present

## 2018-10-02 DIAGNOSIS — I10 Essential (primary) hypertension: Secondary | ICD-10-CM | POA: Diagnosis not present

## 2018-10-02 DIAGNOSIS — F3162 Bipolar disorder, current episode mixed, moderate: Secondary | ICD-10-CM | POA: Diagnosis not present

## 2018-10-02 DIAGNOSIS — M109 Gout, unspecified: Secondary | ICD-10-CM | POA: Diagnosis not present

## 2018-10-06 ENCOUNTER — Ambulatory Visit: Payer: Medicare Other | Admitting: Psychiatry

## 2018-10-20 ENCOUNTER — Ambulatory Visit: Payer: Medicare Other | Admitting: Psychiatry

## 2018-10-25 ENCOUNTER — Other Ambulatory Visit: Payer: Self-pay | Admitting: Psychiatry

## 2018-11-02 ENCOUNTER — Other Ambulatory Visit (HOSPITAL_COMMUNITY)
Admission: RE | Admit: 2018-11-02 | Discharge: 2018-11-02 | Disposition: A | Discharge status: Home / Self Care | Payer: Medicare Other | Source: Ambulatory Visit | Attending: Family Medicine | Admitting: Family Medicine

## 2018-11-02 ENCOUNTER — Ambulatory Visit (HOSPITAL_COMMUNITY)
Admission: RE | Admit: 2018-11-02 | Discharge: 2018-11-02 | Disposition: A | Discharge status: Home / Self Care | Payer: Medicare Other | Source: Ambulatory Visit | Attending: Family Medicine | Admitting: Family Medicine

## 2018-11-02 ENCOUNTER — Other Ambulatory Visit (HOSPITAL_COMMUNITY): Payer: Self-pay | Admitting: Family Medicine

## 2018-11-02 ENCOUNTER — Other Ambulatory Visit: Payer: Self-pay

## 2018-11-02 ENCOUNTER — Other Ambulatory Visit: Payer: Self-pay | Admitting: Family Medicine

## 2018-11-02 DIAGNOSIS — R410 Disorientation, unspecified: Secondary | ICD-10-CM | POA: Insufficient documentation

## 2018-11-02 DIAGNOSIS — S0990XA Unspecified injury of head, initial encounter: Secondary | ICD-10-CM | POA: Diagnosis not present

## 2018-11-02 DIAGNOSIS — W19XXXA Unspecified fall, initial encounter: Secondary | ICD-10-CM | POA: Insufficient documentation

## 2018-11-02 DIAGNOSIS — Z6831 Body mass index (BMI) 31.0-31.9, adult: Secondary | ICD-10-CM | POA: Diagnosis not present

## 2018-11-02 DIAGNOSIS — G319 Degenerative disease of nervous system, unspecified: Secondary | ICD-10-CM | POA: Diagnosis not present

## 2018-11-02 DIAGNOSIS — R9082 White matter disease, unspecified: Secondary | ICD-10-CM | POA: Diagnosis not present

## 2018-11-02 DIAGNOSIS — S0990XD Unspecified injury of head, subsequent encounter: Secondary | ICD-10-CM | POA: Diagnosis not present

## 2018-11-06 ENCOUNTER — Encounter: Payer: Self-pay | Admitting: Neurology

## 2018-11-09 ENCOUNTER — Ambulatory Visit: Payer: Medicare Other | Admitting: Psychiatry

## 2018-11-10 ENCOUNTER — Other Ambulatory Visit: Payer: Self-pay

## 2018-11-10 ENCOUNTER — Telehealth: Payer: Self-pay | Admitting: Psychiatry

## 2018-11-10 MED ORDER — BUPROPION HCL ER (XL) 150 MG PO TB24: 150.0000 mg | ORAL_TABLET | Freq: Every day | ORAL | 0 refills | Status: DC

## 2018-11-10 NOTE — Telephone Encounter (Signed)
Patient's wife [name] stated patient is confused and may need to decrease the Wellbutrin, has appt., with Neurologist on 06/22.  Please advise if it's a possibility that Medication can be causing confusion, patient also takes medication for shaking.

## 2018-11-10 NOTE — Telephone Encounter (Signed)
Wife is notified and will submit rx to [facility] in [place]. Asked to call back for appt and to follow up.

## 2018-11-10 NOTE — Telephone Encounter (Signed)
The Wellbutrin could be making him more anxious/agitated. Lets decrease to Wellbtrin XL 150 mg/d.  Ok to call in #30 no RF, and make appt in next month.

## 2018-11-19 ENCOUNTER — Other Ambulatory Visit: Payer: Self-pay

## 2018-11-19 MED ORDER — BENZTROPINE MESYLATE 1 MG PO TABS: ORAL_TABLET | ORAL | 1 refills | Status: DC

## 2018-12-02 ENCOUNTER — Ambulatory Visit (INDEPENDENT_AMBULATORY_CARE_PROVIDER_SITE_OTHER): Payer: Medicare Other | Admitting: Psychiatry

## 2018-12-02 ENCOUNTER — Encounter: Payer: Self-pay | Admitting: Psychiatry

## 2018-12-02 ENCOUNTER — Other Ambulatory Visit: Payer: Self-pay

## 2018-12-02 DIAGNOSIS — F3131 Bipolar disorder, current episode depressed, mild: Secondary | ICD-10-CM | POA: Diagnosis not present

## 2018-12-02 DIAGNOSIS — F411 Generalized anxiety disorder: Secondary | ICD-10-CM

## 2018-12-02 DIAGNOSIS — G251 Drug-induced tremor: Secondary | ICD-10-CM | POA: Diagnosis not present

## 2018-12-02 DIAGNOSIS — R410 Disorientation, unspecified: Secondary | ICD-10-CM | POA: Diagnosis not present

## 2018-12-02 NOTE — Progress Notes (Signed)
Casey [name] [id number] [birth year] 73 y.o.  Virtual Visit via Telephone Note  I connected with@ on 12/02/18 at  2:00 PM EDT by telephone and verified that I am speaking with the correct person using two identifiers.   I discussed the limitations, risks, security and privacy concerns of performing an evaluation and management service by telephone and the availability of in person appointments. I also discussed with the patient that there may be a patient responsible charge related to this service. The patient expressed understanding and agreed to proceed.   I discussed the assessment and treatment plan with the patient. The patient was provided an opportunity to ask questions and all were answered. The patient agreed with the plan and demonstrated an understanding of the instructions.   The patient was advised to call back or seek an in-person evaluation if the symptoms worsen or if the condition fails to improve as anticipated.  I provided 25 minutes of non-face-to-face time during this encounter.  The patient was located at home.  The provider was located at home.   Purnell Shoemaker, MD   Subjective:   Patient ID:  Casey [name] is a 73 y.o. (DOB November 10, 1945) male.  Chief Complaint:  Chief Complaint  Patient presents with  . Follow-up    Medication Management  . Depression    Medication Management  . Other    Wants to cut down on psych meds.  . Altered Mental Status    HPI Casey [name] presents for follow-up of bipolar disorder and anxiety and his wife complains he is recently more confused.  This was an urgent appointment because of his confusion.  Last visit was in February 2020.  He was weaned off of Seroquel 300 mg during the winter due to concerns about parkinsonism.  Benztropine was added for tremor during the winter.  His Wellbutrin was recently decreased from 300 mg a day to 150 mg a day out of concerns it could be contributing to anxiety.  Spoke with wife [name].   Concerned about his concentration and memory.  Tremors stopped but he was taken off Seroquel.  Confused about Covid and doesn't understand about it.  She noticed worsening cognition since January.  Rarely Xanax.  He does not know why he cannot go into a restaurant.  He did not understand when it was explained to him about physician's assistant, Jerilynn Birkenhead recently dying.  Patient reports stable mood and denies depressed or irritable moods.  Patient denies any recent difficulty with anxiety.  Patient denies difficulty with sleep initiation or maintenance. Denies appetite disturbance.  Patient reports that energy and motivation have been good.  Patient has difficulty with concentration and memory.  Patient denies any suicidal ideation.  Past Psychiatric Medication Trials: Seroquel 300, olanzapine, perphenazine, risperidone, lithium, Trileptal, lamotrigine caused nausea, Wellbutrin, alprazolam, Depakote, Trileptal, carbamazepine, citalopram, Abilify, sertraline 150, Latuda                                                                Review of Systems:  Review of Systems  Eyes: Positive for visual disturbance.  Neurological: Positive for weakness. Negative for tremors.       Falls from off balance   Psychiatric/Behavioral: Positive for confusion. Negative for agitation, behavioral problems, decreased  concentration, dysphoric mood, hallucinations, self-injury, sleep disturbance and suicidal ideas. The patient is not nervous/anxious and is not hyperactive.     Medications: I have reviewed the patient's current medications.  Current Outpatient Medications  Medication Sig Dispense Refill  . acetaminophen (TYLENOL) 500 MG tablet Take 1,000 mg by mouth every 6 (six) hours as needed for headache.    . Albuterol Sulfate 108 (90 Base) MCG/ACT AEPB Inhale 1-2 puffs into the lungs as needed.     . ALPRAZolam (XANAX) 0.5 MG tablet Take 1 tablet (0.5 mg total) by mouth 2 (two) times daily as needed for  anxiety. 30 tablet 1  . benztropine (COGENTIN) 1 MG tablet take 2 tabs/day 60 tablet 1  . buPROPion (WELLBUTRIN XL) 150 MG 24 hr tablet Take 1 tablet (150 mg total) by mouth daily. 30 tablet 0  . cholecalciferol (VITAMIN D) 1000 UNITS tablet Take 2,000 Units by mouth daily.    . fluticasone (FLONASE) 50 MCG/ACT nasal spray Place 2 sprays into both nostrils daily.  11  . Ginkgo Biloba 40 MG TABS Take 60 mg by mouth 2 (two) times daily.    Marland Kitchen levothyroxine (SYNTHROID, LEVOTHROID) 25 MCG tablet Take 25 mcg by mouth daily before breakfast.     . ondansetron (ZOFRAN-ODT) 4 MG disintegrating tablet Take 1 tablet by mouth 3 (three) times daily as needed.  2  . oxcarbazepine (TRILEPTAL) 600 MG tablet Take 1 tablet (600 mg total) by mouth 3 (three) times daily. 90 tablet 1  . pantoprazole (PROTONIX) 40 MG tablet TAKE ONE TABLET BY MOUTH ONCE DAILY 30 tablet 11  . rivaroxaban (XARELTO) 20 MG TABS tablet Take 20 mg by mouth daily after supper.     . SUPER B COMPLEX/C PO Take by mouth.    . Wheat Dextrin (BENEFIBER PO) Take 1 Dose by mouth as needed (constapation).      No current facility-administered medications for this visit.     Medication Side Effects: Other: falls and confusion  Allergies:  Allergies  Allergen Reactions  . Amlodipine Other (See Comments)    Stopped due to kidney issues  . Vicodin [Hydrocodone-Acetaminophen] Other (See Comments)    Patient is unsure of reaction    Past Medical History:  Diagnosis Date  . Arthritis   . Atrial fibrillation ([place]) 01/12/2010   2D Echo EF=>55%  . Bipolar disorder ([place])   . Chronic back pain   . COPD (chronic obstructive pulmonary disease) ([place])   . Depression   . Dysrhythmia    AFib  . History of colonic polyps   . History of gout   . Hyperlipidemia   . Hypertension   . Hypothyroidism   . Morbid obesity ([place])   . OSA (obstructive sleep apnea)    AHI was 27.62hr RDI was 34.3 hr REM 0.00hr; had CPAP years ago but no longer uses.  .  Prostatitis   . Tubular adenoma of colon 01/2016    Family History  Problem Relation Age of Onset  . Diabetes Mother   . Heart failure Mother   . Hypertension Mother   . Hypertension Father   . Cancer Paternal Grandmother   . Stroke Sister   . Colon cancer Neg Hx     Social History   Socioeconomic History  . Marital status: Married    Spouse name: Not on file  . Number of children: Not on file  . Years of education: Not on file  . Highest education level: Not on file  Occupational  History  . Not on file  Social Needs  . Financial resource strain: Not on file  . Food insecurity:    Worry: Not on file    Inability: Not on file  . Transportation needs:    Medical: Not on file    Non-medical: Not on file  Tobacco Use  . Smoking status: Never Smoker  . Smokeless tobacco: Never Used  Substance and Sexual Activity  . Alcohol use: No    Alcohol/week: 0.0 standard drinks  . Drug use: No  . Sexual activity: Yes    Birth control/protection: None  Lifestyle  . Physical activity:    Days per week: Not on file    Minutes per session: Not on file  . Stress: Not on file  Relationships  . Social connections:    Talks on phone: Not on file    Gets together: Not on file    Attends religious service: Not on file    Active member of club or organization: Not on file    Attends meetings of clubs or organizations: Not on file    Relationship status: Not on file  . Intimate partner violence:    Fear of current or ex partner: Not on file    Emotionally abused: Not on file    Physically abused: Not on file    Forced sexual activity: Not on file  Other Topics Concern  . Not on file  Social History Narrative  . Not on file    Past Medical History, Surgical history, Social history, and Family history were reviewed and updated as appropriate.   Please see review of systems for further details on the patient's review from today.   Objective:   Physical Exam:  There were no  vitals taken for this visit.  Physical Exam Neurological:     Mental Status: He is alert and oriented to person, place, and time.     Cranial Nerves: No dysarthria.  Psychiatric:        Attention and Perception: Attention normal.        Mood and Affect: Mood normal.        Speech: Speech normal.        Behavior: Behavior is cooperative.        Thought Content: Thought content normal. Thought content is not paranoid or delusional. Thought content does not include homicidal or suicidal ideation. Thought content does not include homicidal or suicidal plan.        Cognition and Memory: Cognition is impaired. Memory is impaired. He exhibits impaired recent memory.        Judgment: Judgment normal.     Comments: He is oriented to month and year and place and situation.  He does not know the day of the week.Insight fair.     Lab Review:     Component Value Date/Time   NA 139 06/30/2018 1311   K 4.3 06/30/2018 1311   CL 107 06/30/2018 1311   CO2 24 06/30/2018 1311   GLUCOSE 111 (H) 06/30/2018 1311   BUN 14 06/30/2018 1311   CREATININE 1.14 06/30/2018 1311   CALCIUM 9.2 06/30/2018 1311   PROT 7.3 06/30/2018 1311   ALBUMIN 4.1 06/30/2018 1311   AST 28 06/30/2018 1311   ALT 23 06/30/2018 1311   ALKPHOS 89 06/30/2018 1311   BILITOT 1.1 06/30/2018 1311   GFRNONAA >60 06/30/2018 1311   GFRAA >60 06/30/2018 1311       Component Value Date/Time   WBC 5.4  06/30/2018 1311   RBC 4.52 06/30/2018 1311   HGB 14.4 06/30/2018 1311   HCT 43.9 06/30/2018 1311   PLT 269 06/30/2018 1311   MCV 97.1 06/30/2018 1311   MCH 31.9 06/30/2018 1311   MCHC 32.8 06/30/2018 1311   RDW 13.1 06/30/2018 1311   LYMPHSABS 1.3 06/30/2018 1311   MONOABS 0.5 06/30/2018 1311   EOSABS 0.1 06/30/2018 1311   BASOSABS 0.0 06/30/2018 1311    No results found for: POCLITH, LITHIUM   No results found for: PHENYTOIN, PHENOBARB, VALPROATE, CBMZ   .res Assessment: Plan:    Disorientation  Bipolar affective  disorder, currently depressed, mild (HCC)  Generalized anxiety disorder  Tremor due to multiple drugs   It is highly likely that the benztropine is contributing to the confusion if not causing it.  It was given for tremors while he was on Seroquel.  He was weaned off the Seroquel.  At this time the tremors have resolved but it is uncertain if that is due to stopping the Seroquel or the addition of the benztropine.  However the benztropine tendency to cause confusion is well known and it should be weaned.  He will be weaned off of it over a week.  There were cautioned about possible diarrhea or other side effects of withdrawal and to call if he has any of those.  They were also cautioned about the possible return of tremors and if that occurs we will have to pursue a different treatment modality.  Keep the appointment with neurologist next month  Because of the balance issues change Trileptal from 600 in the morning and 300 at night to all 900 mg at night.  Be careful about getting up at night for fall risk.    Discussed fall precautions  Minimize Xanax because of the possible side effect of confusion.  At this time he is taking it rarely.  We discussed the short-term risks associated with benzodiazepines including sedation and increased fall risk among others.  Discussed long-term side effect risk including dependence, potential withdrawal symptoms, and the potential eventual dose-related risk of dementia.  His bipolar disorder is stable at this time and his anxiety is under control.  Follow-up 6 weeks or sooner as needed.  Lynder Parents, MD, DFAPA  Please see After Visit Summary for patient specific instructions.  Future Appointments  Date Time Provider [place]  01/04/2019  9:00 AM Cameron Sprang, MD [facility] None    No orders of the defined types were placed in this encounter.     -------------------------------

## 2018-12-10 ENCOUNTER — Other Ambulatory Visit: Payer: Self-pay | Admitting: Physician Assistant

## 2018-12-17 ENCOUNTER — Telehealth: Payer: Self-pay | Admitting: Psychiatry

## 2018-12-17 DIAGNOSIS — K219 Gastro-esophageal reflux disease without esophagitis: Secondary | ICD-10-CM | POA: Diagnosis not present

## 2018-12-17 NOTE — Telephone Encounter (Signed)
Patient's wife called and said that Casey [name] has been taking the 3 trileptal at night to sleep and for the last week he wakes up every morning vomiting and he feels bad most of the day. Is this from the medicine? Please call Casey [name] his wife at [phone]

## 2018-12-17 NOTE — Telephone Encounter (Signed)
Nausea and vomiting could be related.  Because of disorientation we changed him from 3 times daily dosing to nightly dosing at his last visit.  Verify with them what dosage drinks the Trileptal tablets are.  The med list says 600 mg tablets but I thought they were 300 mg tablets.  I just started seeing him.  Have him reduce to 2 tablets daily 1 at his evening meal and 1 at bedtime and see if that solves the problem.  Call if he has additional mood swings from reducing the dosage.

## 2018-12-18 ENCOUNTER — Encounter: Payer: Self-pay | Admitting: Neurology

## 2018-12-28 ENCOUNTER — Telehealth: Payer: Self-pay | Admitting: *Deleted

## 2018-12-28 ENCOUNTER — Ambulatory Visit (INDEPENDENT_AMBULATORY_CARE_PROVIDER_SITE_OTHER): Payer: Medicare Other | Admitting: Gastroenterology

## 2018-12-28 ENCOUNTER — Other Ambulatory Visit: Payer: Self-pay

## 2018-12-28 ENCOUNTER — Other Ambulatory Visit: Payer: Self-pay | Admitting: *Deleted

## 2018-12-28 ENCOUNTER — Encounter: Payer: Self-pay | Admitting: Gastroenterology

## 2018-12-28 VITALS — BP 156/81 | HR 60 | Temp 97.1°F | Ht 72.5 in | Wt 214.8 lb

## 2018-12-28 DIAGNOSIS — R112 Nausea with vomiting, unspecified: Secondary | ICD-10-CM | POA: Diagnosis not present

## 2018-12-28 DIAGNOSIS — K219 Gastro-esophageal reflux disease without esophagitis: Secondary | ICD-10-CM | POA: Insufficient documentation

## 2018-12-28 DIAGNOSIS — R1033 Periumbilical pain: Secondary | ICD-10-CM

## 2018-12-28 DIAGNOSIS — R109 Unspecified abdominal pain: Secondary | ICD-10-CM | POA: Insufficient documentation

## 2018-12-28 DIAGNOSIS — R634 Abnormal weight loss: Secondary | ICD-10-CM | POA: Diagnosis not present

## 2018-12-28 DIAGNOSIS — K5900 Constipation, unspecified: Secondary | ICD-10-CM | POA: Diagnosis not present

## 2018-12-28 MED ORDER — LINACLOTIDE 72 MCG PO CAPS: 72.0000 ug | ORAL_CAPSULE | Freq: Every day | ORAL | 2 refills | Status: DC

## 2018-12-28 NOTE — Progress Notes (Signed)
Primary Care Physician: Casey Sites, MD  Primary Gastroenterologist:  Casey Cornea, MD   Chief Complaint  Patient presents with  . Emesis  . Constipation  . Abdominal Pain    HPI: Casey [name] is a 73 y.o. male here for follow-up.  He has a history of A. fib on Xarelto, GERD.  Last seen in January 2019. He has a history of multiple colon polyps, due for surveillance in summer of 2022.  Since we last saw him was hospitalized back in December with hallucinations, chills, confusion. At the time he had a temperature of 104.  Found to have sepsis related to UTI.  He required brief nursing home stay for rehabilitation.  He has had several falls recently.  Head CT in April with no acute findings.  Seen in the ED back in January after a fall and had acute rib fractures and laceration to the head. Wife states these symptoms improved with adjustments to his Trileptal which was causing dizziness.   Patient reports several month history of periumbilical pain. Can be present without meals but worsened by meals and associated with N/V, early satiety, and documented weight loss. He has lost 15 pounds in the past 6 months. 30 pound weight loss in past 18 months. He complains of poor appetite. Wakes up in the morning with nausea. Was vomiting daily. Some modest improvement now on pantoprazole. Still with vomiting 3-4 times per week. Wife was present via speaker phone given COVID 19 visitor restrictions. She reports patient has had hard hiccups, belching. Vomiting seems to be worse when he hasn't had a BM in 3-4 days. He has been taking dulcolax prn and fiber. BM every 2-4 days. No melena, brbpr. No heartburn. Constipation more or less a new problem. Having more problems lately.   ecently back on pantoprazole, zofran.   Current Outpatient Medications  Medication Sig Dispense Refill  . acetaminophen (TYLENOL) 500 MG tablet Take 1,000 mg by mouth every 6 (six) hours as needed for headache.    .  Albuterol Sulfate 108 (90 Base) MCG/ACT AEPB Inhale 1-2 puffs into the lungs as needed.     . ALPRAZolam (XANAX) 0.5 MG tablet Take 1 tablet (0.5 mg total) by mouth 2 (two) times daily as needed for anxiety. 30 tablet 1  . bisacodyl (DULCOLAX) 5 MG EC tablet Take 5-15 mg by mouth daily as needed for moderate constipation.    Marland Kitchen buPROPion (WELLBUTRIN XL) 150 MG 24 hr tablet Take 1 tablet by mouth once daily 30 tablet 0  . cholecalciferol (VITAMIN D) 1000 UNITS tablet Take 2,000 Units by mouth daily.    . fluticasone (FLONASE) 50 MCG/ACT nasal spray Place 2 sprays into both nostrils daily.  11  . Ginkgo Biloba 40 MG TABS Take 60 mg by mouth 2 (two) times daily.    Marland Kitchen levothyroxine (SYNTHROID, LEVOTHROID) 25 MCG tablet Take 25 mcg by mouth daily before breakfast.     . ondansetron (ZOFRAN-ODT) 4 MG disintegrating tablet Take 1 tablet by mouth 3 (three) times daily as needed.  2  . oxcarbazepine (TRILEPTAL) 600 MG tablet Take 1 tablet (600 mg total) by mouth 3 (three) times daily. (Patient taking differently: Take 600 mg by mouth 2 (two) times daily. ) 90 tablet 1  . pantoprazole (PROTONIX) 40 MG tablet TAKE ONE TABLET BY MOUTH ONCE DAILY 30 tablet 11  . rivaroxaban (XARELTO) 20 MG TABS tablet Take 20 mg by mouth daily after supper.     Marland Kitchen  SUPER B COMPLEX/C PO Take by mouth.    . Wheat Dextrin (BENEFIBER PO) Take 1 Dose by mouth as needed (constapation).      No current facility-administered medications for this visit.     Allergies as of 12/28/2018 - Review Complete 12/28/2018  Allergen Reaction Noted  . Amlodipine Other (See Comments) 11/02/2012  . Vicodin [hydrocodone-acetaminophen] Other (See Comments) 01/15/2016   Past Medical History:  Diagnosis Date  . Arthritis   . Atrial fibrillation ([place]) 01/12/2010   2D Echo EF=>55%  . Bipolar disorder ([place])   . Chronic back pain   . COPD (chronic obstructive pulmonary disease) ([place])   . Depression   . Dysrhythmia    AFib  . History of colonic  polyps   . History of gout   . Hyperlipidemia   . Hypertension   . Hypothyroidism   . Morbid obesity ([place])   . OSA (obstructive sleep apnea)    AHI was 27.62hr RDI was 34.3 hr REM 0.00hr; had CPAP years ago but no longer uses.  . Prostatitis   . Tubular adenoma of colon 01/2016   Past Surgical History:  Procedure Laterality Date  . APPENDECTOMY  1959  . asthma    . CATARACT EXTRACTION Bilateral 1995  . COLONOSCOPY  2011   RMR: left-sided diverticula, minimal rectal friability. No polyps. Repeat 5 years.  . COLONOSCOPY WITH PROPOFOL N/A 01/15/2016   Dr.Rourk- diverticulosis in the entire examined colon, multiple rectal and colonic polyps, internal hemorrhoids. bx= tubular adenomas and hyperplastic polyps.   Marland Kitchen HAND / FINGER LESION EXCISION Right   . POLYPECTOMY  01/15/2016   Procedure: POLYPECTOMY;  Surgeon: Daneil Dolin, MD;  Location: [facility];  Service: Endoscopy;;  . right shoulder surgery    . TONSILLECTOMY  1955   Family History  Problem Relation Age of Onset  . Diabetes Mother   . Heart failure Mother   . Hypertension Mother   . Hypertension Father   . Cancer Paternal Grandmother   . Stroke Sister   . Colon cancer Neg Hx    Social History   Tobacco Use  . Smoking status: Never Smoker  . Smokeless tobacco: Never Used  Substance Use Topics  . Alcohol use: No    Alcohol/week: 0.0 standard drinks  . Drug use: No    ROS:  General: Negative for fever, chills, fatigue, weakness. See hpi ENT: Negative for hoarseness, difficulty swallowing , nasal congestion. CV: Negative for chest pain, angina, palpitations, dyspnea on exertion, peripheral edema.  Respiratory: Negative for dyspnea at rest, dyspnea on exertion, cough, sputum, wheezing.  GI: See history of present illness. GU:  Negative for dysuria, hematuria, urinary incontinence, urinary frequency, nocturnal urination.  Endo: see hpi   Physical Examination:   BP (!) 156/81   Pulse 60   Temp (!) 97.1 F  (36.2 C) (Oral)   Ht 6' 0.5" (1.842 m)   Wt 214 lb 12.8 oz (97.4 kg)   BMI 28.73 kg/m   General: Well-nourished, well-developed in no acute distress.  Eyes: No icterus. Mouth: Oropharyngeal mucosa moist and pink , no lesions erythema or exudate. Lungs: Clear to auscultation bilaterally.  Heart: Regular rate and rhythm, no murmurs rubs or gallops.  Abdomen: Bowel sounds are normal, nondistended, no hepatosplenomegaly or masses, no abdominal bruits or hernia , no rebound or guarding.  Moderate periumbilical tenderness with deep palpation. Extremities: No lower extremity edema. No clubbing or deformities. Neuro: Alert and oriented x 4   Skin: Warm  and dry, no jaundice.   Psych: Alert and cooperative, normal mood and affect.  Labs:  Lab Results  Component Value Date   CREATININE 1.14 06/30/2018   BUN 14 06/30/2018   NA 139 06/30/2018   K 4.3 06/30/2018   CL 107 06/30/2018   CO2 24 06/30/2018   Lab Results  Component Value Date   WBC 5.4 06/30/2018   HGB 14.4 06/30/2018   HCT 43.9 06/30/2018   MCV 97.1 06/30/2018   PLT 269 06/30/2018   Lab Results  Component Value Date   ALT 23 06/30/2018   AST 28 06/30/2018   ALKPHOS 89 06/30/2018   BILITOT 1.1 06/30/2018     Imaging Studies: No results found.

## 2018-12-28 NOTE — Patient Instructions (Signed)
1. CT scan of your abdomen as scheduled.  2. Please have your labs done today. 3. Once CT results are back we will have you start Linzess once daily on empty stomach for constipation. Samples provided today.  4. IF YOU GO TO PHARMACY TO PICK UP LINZESS AND IT IS EXPENSIVE DO NOT BUY. PLEASE LET us KNOW. 5. Please continue pantoprazole once daily before breakfast.

## 2018-12-28 NOTE — Assessment & Plan Note (Signed)
73 y/o male with history of GERD presenting for several month h/o periumbilical abdominal pain, worse with meals, associated with N/V, weight loss. Typical heartburn well controlled. Wakes up in morning with nausea. Symptoms worsened by meals. Poor appetite and early satiety. Also noted worsening constipation over past few months. Symptoms are not typical of biliary disease. Cannot exclude PUD however given location of pain, significant weight loss malignancy also remains in differential. Bowel obstruction possible but less likely. At this time recommend labs and CT A/P with contrast. Will start Linzess 37mcg daily once CT completed. Continue pantoprazole 40mg  daily along with prn zofran. Further recommendations to follow pending completion of studies.

## 2018-12-28 NOTE — Telephone Encounter (Signed)
Patient called back and is aware of appt details 

## 2018-12-28 NOTE — Telephone Encounter (Signed)
Patient CT a/p scheduled for Wednesday 6/17 at 3:30pm, arrival time 3:15pm, npo 4 hrs prior, p/u oral contrast with instructions from [facility].  LMTCB with spouse

## 2018-12-29 LAB — CBC WITH DIFFERENTIAL/PLATELET
Basophils Absolute: 0 10*3/uL (ref 0.0–0.2)
Basos: 0 %
EOS (ABSOLUTE): 0.1 10*3/uL (ref 0.0–0.4)
Eos: 2 %
Hematocrit: 42.9 % (ref 37.5–51.0)
Hemoglobin: 15.2 g/dL (ref 13.0–17.7)
Immature Grans (Abs): 0 10*3/uL (ref 0.0–0.1)
Immature Granulocytes: 0 %
Lymphocytes Absolute: 1.1 10*3/uL (ref 0.7–3.1)
Lymphs: 14 %
MCH: 31.6 pg (ref 26.6–33.0)
MCHC: 35.4 g/dL (ref 31.5–35.7)
MCV: 89 fL (ref 79–97)
Monocytes Absolute: 0.7 10*3/uL (ref 0.1–0.9)
Monocytes: 8 %
Neutrophils Absolute: 6.1 10*3/uL (ref 1.4–7.0)
Neutrophils: 76 %
Platelets: 186 10*3/uL (ref 150–450)
RBC: 4.81 x10E6/uL (ref 4.14–5.80)
RDW: 12.9 % (ref 11.6–15.4)
WBC: 8.1 10*3/uL (ref 3.4–10.8)

## 2018-12-29 LAB — COMPREHENSIVE METABOLIC PANEL
ALT: 22 IU/L (ref 0–44)
AST: 21 IU/L (ref 0–40)
Albumin/Globulin Ratio: 2.1 (ref 1.2–2.2)
Albumin: 4.5 g/dL (ref 3.7–4.7)
Alkaline Phosphatase: 115 IU/L (ref 39–117)
BUN/Creatinine Ratio: 12 (ref 10–24)
BUN: 16 mg/dL (ref 8–27)
Bilirubin Total: 0.7 mg/dL (ref 0.0–1.2)
CO2: 22 mmol/L (ref 20–29)
Calcium: 9.4 mg/dL (ref 8.6–10.2)
Chloride: 104 mmol/L (ref 96–106)
Creatinine, Ser: 1.3 mg/dL — ABNORMAL HIGH (ref 0.76–1.27)
GFR calc Af Amer: 63 mL/min/{1.73_m2} (ref >59)
GFR calc non Af Amer: 55 mL/min/{1.73_m2} — ABNORMAL LOW (ref >59)
Globulin, Total: 2.1 g/dL (ref 1.5–4.5)
Glucose: 88 mg/dL (ref 65–99)
Potassium: 4 mmol/L (ref 3.5–5.2)
Sodium: 141 mmol/L (ref 134–144)
Total Protein: 6.6 g/dL (ref 6.0–8.5)

## 2018-12-29 LAB — TSH+FREE T4
Free T4: 1 ng/dL (ref 0.82–1.77)
TSH: 1.61 u[IU]/mL (ref 0.450–4.500)

## 2018-12-29 LAB — LIPASE: Lipase: 25 U/L (ref 13–78)

## 2018-12-29 NOTE — Progress Notes (Signed)
cc'd to pcp 

## 2018-12-30 ENCOUNTER — Other Ambulatory Visit: Payer: Self-pay

## 2018-12-30 ENCOUNTER — Ambulatory Visit (HOSPITAL_COMMUNITY)
Admission: RE | Admit: 2018-12-30 | Discharge: 2018-12-30 | Disposition: A | Discharge status: Home / Self Care | Payer: Medicare Other | Source: Ambulatory Visit | Attending: Gastroenterology | Admitting: Gastroenterology

## 2018-12-30 DIAGNOSIS — R1110 Vomiting, unspecified: Secondary | ICD-10-CM | POA: Diagnosis not present

## 2018-12-30 DIAGNOSIS — R112 Nausea with vomiting, unspecified: Secondary | ICD-10-CM | POA: Diagnosis not present

## 2018-12-30 DIAGNOSIS — R634 Abnormal weight loss: Secondary | ICD-10-CM | POA: Diagnosis not present

## 2018-12-30 DIAGNOSIS — R1033 Periumbilical pain: Secondary | ICD-10-CM | POA: Diagnosis not present

## 2018-12-30 MED ORDER — IOHEXOL 300 MG/ML  SOLN
100.0000 mL | Freq: Once | INTRAMUSCULAR | Status: AC | PRN
  Administered 2018-12-30: 100 mL via INTRAVENOUS

## 2018-12-31 NOTE — Progress Notes (Signed)
PT is aware and I was on phone 13 min with PCP and no answer, kept saying one min. Pt is aware to call PCP first thing in the morning, and I will call PCP first thing in the morning also.

## 2019-01-01 DIAGNOSIS — E6609 Other obesity due to excess calories: Secondary | ICD-10-CM | POA: Diagnosis not present

## 2019-01-01 DIAGNOSIS — Z6830 Body mass index (BMI) 30.0-30.9, adult: Secondary | ICD-10-CM | POA: Diagnosis not present

## 2019-01-01 DIAGNOSIS — J90 Pleural effusion, not elsewhere classified: Secondary | ICD-10-CM | POA: Diagnosis not present

## 2019-01-01 NOTE — Progress Notes (Signed)
I called PCP and spoke to Casey [name]. Pt is already there as a walk in. She wasn't sure they had the CT report, I am faxing to her at [phone].

## 2019-01-04 ENCOUNTER — Ambulatory Visit: Payer: Medicare Other | Admitting: Neurology

## 2019-01-04 ENCOUNTER — Other Ambulatory Visit: Payer: Self-pay

## 2019-01-04 ENCOUNTER — Encounter: Payer: Self-pay | Admitting: Gastroenterology

## 2019-01-04 MED ORDER — OXCARBAZEPINE 600 MG PO TABS: 600.0000 mg | ORAL_TABLET | Freq: Two times a day (BID) | ORAL | 2 refills | Status: DC

## 2019-01-04 NOTE — Progress Notes (Signed)
PATIENT SCHEDULED AND LETTER SENT  °

## 2019-01-13 ENCOUNTER — Ambulatory Visit (INDEPENDENT_AMBULATORY_CARE_PROVIDER_SITE_OTHER): Payer: Medicare Other | Admitting: Psychiatry

## 2019-01-13 ENCOUNTER — Encounter: Payer: Self-pay | Admitting: Psychiatry

## 2019-01-13 ENCOUNTER — Other Ambulatory Visit: Payer: Self-pay

## 2019-01-13 DIAGNOSIS — F05 Delirium due to known physiological condition: Secondary | ICD-10-CM

## 2019-01-13 DIAGNOSIS — F3131 Bipolar disorder, current episode depressed, mild: Secondary | ICD-10-CM | POA: Diagnosis not present

## 2019-01-13 DIAGNOSIS — R410 Disorientation, unspecified: Secondary | ICD-10-CM

## 2019-01-13 DIAGNOSIS — F411 Generalized anxiety disorder: Secondary | ICD-10-CM

## 2019-01-13 DIAGNOSIS — G251 Drug-induced tremor: Secondary | ICD-10-CM | POA: Diagnosis not present

## 2019-01-13 MED ORDER — BUPROPION HCL ER (XL) 150 MG PO TB24: 150.0000 mg | ORAL_TABLET | Freq: Every day | ORAL | 1 refills | Status: DC

## 2019-01-13 NOTE — Progress Notes (Signed)
Casey [name] [id number] 12-06-1945 73 y.o.  Virtual Visit via Telephone Note  I connected with@ on 01/13/19 at 11:30 AM EDT by telephone and verified that I am speaking with the correct person using two identifiers.   I discussed the limitations, risks, security and privacy concerns of performing an evaluation and management service by telephone and the availability of in person appointments. I also discussed with the patient that there may be a patient responsible charge related to this service. The patient expressed understanding and agreed to proceed.   I discussed the assessment and treatment plan with the patient. The patient was provided an opportunity to ask questions and all were answered. The patient agreed with the plan and demonstrated an understanding of the instructions.   The patient was advised to call back or seek an in-person evaluation if the symptoms worsen or if the condition fails to improve as anticipated.  I provided 25 minutes of non-face-to-face time during this encounter.  The patient was located at home.  The provider was located at home.   Purnell Shoemaker, MD   Subjective:   Patient ID:  Casey [name] is a 73 y.o. (DOB 1946/03/29) male.  Chief Complaint:  Chief Complaint  Patient presents with  . Follow-up    med changes and SE    HPI seen with wife Casey [name] presents for follow-up of bipolar disorder and anxiety and his wife complains he is recently more confused.  This was an urgent appointment because of his confusion.  Last visit was Dec 02, 2018.  He was having confusion and benztropine was stopped to see if that was the cause.  Also it was having balance issues and therefore Trileptal was switched all to nighttime 1200 mg nightly.  Confusion much better per both of them with DC benztropine.  Balance better with change Trileptal.  Feeling good overall.  Asks about DC Wellbutrin to see if not needed.  Wife wants to wean as much as  possible.  Had been depressed for awhile but was having medcial problems  Per his wife.  Per wife best he's been in years.  Laughing again at comedies.  Driving again in the last month or so.  Better motivation to do things and work some only in the last wek or so.  Was very inactive and had PT.  Energy and motivation are only recently better.  Patient reports stable mood and denies depressed or irritable moods.  Patient denies any recent difficulty with anxiety, it's better. Gets out more now.  Patient denies difficulty with sleep initiation or maintenance, 8 hours. Denies appetite disturbance.  Patient reports that energy and motivation have been good.  Patient has difficulty with concentration and memory.  Patient denies any suicidal ideation.  Past Psychiatric Medication Trials: Seroquel 300, olanzapine, perphenazine, risperidone, lithium, Trileptal, lamotrigine caused nausea, Wellbutrin, alprazolam, Depakote, Trileptal, carbamazepine, citalopram, Abilify, sertraline 150, Latuda                                                             Review of Systems:  Review of Systems  Eyes: Positive for visual disturbance.  Neurological: Negative for tremors and weakness.          Psychiatric/Behavioral: Negative for agitation, behavioral problems, confusion, decreased concentration,  dysphoric mood, hallucinations, self-injury, sleep disturbance and suicidal ideas. The patient is not nervous/anxious and is not hyperactive.     Medications: I have reviewed the patient's current medications.  Current Outpatient Medications  Medication Sig Dispense Refill  . acetaminophen (TYLENOL) 500 MG tablet Take 1,000 mg by mouth every 6 (six) hours as needed for headache.    . Albuterol Sulfate 108 (90 Base) MCG/ACT AEPB Inhale 1-2 puffs into the lungs as needed.     . bisacodyl (DULCOLAX) 5 MG EC tablet Take 5-15 mg by mouth daily as needed for moderate constipation.    Marland Kitchen buPROPion (WELLBUTRIN XL) 150 MG 24 hr  tablet Take 1 tablet (150 mg total) by mouth daily. 90 tablet 1  . cholecalciferol (VITAMIN D) 1000 UNITS tablet Take 2,000 Units by mouth daily.    . fluticasone (FLONASE) 50 MCG/ACT nasal spray Place 2 sprays into both nostrils daily.  11  . Ginkgo Biloba 40 MG TABS Take 60 mg by mouth 2 (two) times daily.    Marland Kitchen levothyroxine (SYNTHROID, LEVOTHROID) 25 MCG tablet Take 25 mcg by mouth daily before breakfast.     . linaclotide (LINZESS) 72 MCG capsule Take 1 capsule (72 mcg total) by mouth daily before breakfast. 30 capsule 2  . pantoprazole (PROTONIX) 40 MG tablet TAKE ONE TABLET BY MOUTH ONCE DAILY 30 tablet 11  . rivaroxaban (XARELTO) 20 MG TABS tablet Take 20 mg by mouth daily after supper.     . SUPER B COMPLEX/C PO Take by mouth.    . ALPRAZolam (XANAX) 0.5 MG tablet Take 1 tablet (0.5 mg total) by mouth 2 (two) times daily as needed for anxiety. (Patient not taking: Reported on 01/13/2019) 30 tablet 1  . ondansetron (ZOFRAN-ODT) 4 MG disintegrating tablet Take 1 tablet by mouth 3 (three) times daily as needed.  2  . oxcarbazepine (TRILEPTAL) 600 MG tablet Take 1 tablet (600 mg total) by mouth 2 (two) times daily. (Patient taking differently: Take 1,200 mg by mouth at bedtime. ) 60 tablet 2  . Wheat Dextrin (BENEFIBER PO) Take 1 Dose by mouth as needed (constapation).      No current facility-administered medications for this visit.     Medication Side Effects: resolved  Allergies:  Allergies  Allergen Reactions  . Amlodipine Other (See Comments)    Stopped due to kidney issues  . Vicodin [Hydrocodone-Acetaminophen] Other (See Comments)    Patient is unsure of reaction    Past Medical History:  Diagnosis Date  . Arthritis   . Atrial fibrillation ([place]) 01/12/2010   2D Echo EF=>55%  . Bipolar disorder ([place])   . Chronic back pain   . COPD (chronic obstructive pulmonary disease) ([place])   . Depression   . Dysrhythmia    AFib  . History of colonic polyps   . History of gout   .  Hyperlipidemia   . Hypertension   . Hypothyroidism   . Morbid obesity ([place])   . OSA (obstructive sleep apnea)    AHI was 27.62hr RDI was 34.3 hr REM 0.00hr; had CPAP years ago but no longer uses.  . Prostatitis   . Tubular adenoma of colon 01/2016    Family History  Problem Relation Age of Onset  . Diabetes Mother   . Heart failure Mother   . Hypertension Mother   . Hypertension Father   . Cancer Paternal Grandmother   . Stroke Sister   . Colon cancer Neg Hx     Social History  Socioeconomic History  . Marital status: Married    Spouse name: Not on file  . Number of children: Not on file  . Years of education: Not on file  . Highest education level: Not on file  Occupational History  . Not on file  Social Needs  . Financial resource strain: Not on file  . Food insecurity    Worry: Not on file    Inability: Not on file  . Transportation needs    Medical: Not on file    Non-medical: Not on file  Tobacco Use  . Smoking status: Never Smoker  . Smokeless tobacco: Never Used  Substance and Sexual Activity  . Alcohol use: No    Alcohol/week: 0.0 standard drinks  . Drug use: No  . Sexual activity: Yes    Birth control/protection: None  Lifestyle  . Physical activity    Days per week: Not on file    Minutes per session: Not on file  . Stress: Not on file  Relationships  . Social Herbalist on phone: Not on file    Gets together: Not on file    Attends religious service: Not on file    Active member of club or organization: Not on file    Attends meetings of clubs or organizations: Not on file    Relationship status: Not on file  . Intimate partner violence    Fear of current or ex partner: Not on file    Emotionally abused: Not on file    Physically abused: Not on file    Forced sexual activity: Not on file  Other Topics Concern  . Not on file  Social History Narrative  . Not on file    Past Medical History, Surgical history, Social history,  and Family history were reviewed and updated as appropriate.   Please see review of systems for further details on the patient's review from today.   Objective:   Physical Exam:  There were no vitals taken for this visit.  Physical Exam Constitutional:      General: He is not in acute distress.    Appearance: He is well-developed.  Musculoskeletal:        General: No deformity.  Neurological:     Mental Status: He is alert and oriented to person, place, and time.     Coordination: Coordination normal.  Psychiatric:        Attention and Perception: He is attentive. He does not perceive auditory hallucinations.        Mood and Affect: Mood is not anxious or depressed. Affect is not labile, blunt, angry or inappropriate.        Speech: Speech normal.        Behavior: Behavior normal.        Thought Content: Thought content normal. Thought content does not include homicidal or suicidal ideation. Thought content does not include homicidal or suicidal plan.        Cognition and Memory: Cognition normal.        Judgment: Judgment normal.     Comments: Insight intact. No auditory or visual hallucinations. No delusions.  Better cogninition and mood.     Lab Review:     Component Value Date/Time   NA 141 12/28/2018 0941   K 4.0 12/28/2018 0941   CL 104 12/28/2018 0941   CO2 22 12/28/2018 0941   GLUCOSE 88 12/28/2018 0941   GLUCOSE 111 (H) 06/30/2018 1311   BUN 16 12/28/2018 0941  CREATININE 1.30 (H) 12/28/2018 0941   CALCIUM 9.4 12/28/2018 0941   PROT 6.6 12/28/2018 0941   ALBUMIN 4.5 12/28/2018 0941   AST 21 12/28/2018 0941   ALT 22 12/28/2018 0941   ALKPHOS 115 12/28/2018 0941   BILITOT 0.7 12/28/2018 0941   GFRNONAA 55 (L) 12/28/2018 0941   GFRAA 63 12/28/2018 0941       Component Value Date/Time   WBC 8.1 12/28/2018 0941   WBC 5.4 06/30/2018 1311   RBC 4.81 12/28/2018 0941   RBC 4.52 06/30/2018 1311   HGB 15.2 12/28/2018 0941   HCT 42.9 12/28/2018 0941   PLT  186 12/28/2018 0941   MCV 89 12/28/2018 0941   MCH 31.6 12/28/2018 0941   MCH 31.9 06/30/2018 1311   MCHC 35.4 12/28/2018 0941   MCHC 32.8 06/30/2018 1311   RDW 12.9 12/28/2018 0941   LYMPHSABS 1.1 12/28/2018 0941   MONOABS 0.5 06/30/2018 1311   EOSABS 0.1 12/28/2018 0941   BASOSABS 0.0 12/28/2018 0941    No results found for: POCLITH, LITHIUM   No results found for: PHENYTOIN, PHENOBARB, VALPROATE, CBMZ   .res Assessment: Plan:    Xerxes was seen today for follow-up.  Diagnoses and all orders for this visit:  Bipolar affective disorder, currently depressed, mild (HCC) -     buPROPion (WELLBUTRIN XL) 150 MG 24 hr tablet; Take 1 tablet (150 mg total) by mouth daily.  Generalized anxiety disorder  Tremor due to multiple drugs  Subacute confusional state    Confusion resolved off of benztropine.  Balance problem much improved with dosing Trileptal all in the evening.  Depression under good control and in fact the best he has been in years per his wife.  No mood swings.  Anxiety is managed.  Tremor is manageable.  Discussed the that excess caffeine with Wellbutrin could exacerbate it.  Continue Wellbutrin XL 150 mg bc may be helping energy and motivation and too early to stop it now.  He is making progress and we want to have him continue to be physically active and mentally stimulated.  Emphasize this to both he and his wife.  He needs to continue exercising and specifically walking and if he does these things we may be able to discontinue the Wellbutrin in the future.  It is too risky to do that now.  Because he is only recently started making more progress.  Minimize Xanax because of the possible side effect of confusion.  At this time he is taking it rarely.  We discussed the short-term risks associated with benzodiazepines including sedation and increased fall risk among others.  Discussed long-term side effect risk including dependence, potential withdrawal symptoms,  and the potential eventual dose-related risk of dementia.  Discussed side effect of each medication  Follow-up 3 months  This was a 30-minute appointment  Lynder Parents, MD, DFAPA  Please see After Visit Summary for patient specific instructions.  Future Appointments  Date Time Provider [place]  02/19/2019 11:00 AM Westly Pam [facility]  04/13/2019 11:00 AM Cottle, Billey Co., MD CP-CP None    No orders of the defined types were placed in this encounter.     -------------------------------

## 2019-01-28 DIAGNOSIS — Z6830 Body mass index (BMI) 30.0-30.9, adult: Secondary | ICD-10-CM | POA: Diagnosis not present

## 2019-01-28 DIAGNOSIS — E7849 Other hyperlipidemia: Secondary | ICD-10-CM | POA: Diagnosis not present

## 2019-01-28 DIAGNOSIS — E6609 Other obesity due to excess calories: Secondary | ICD-10-CM | POA: Diagnosis not present

## 2019-01-28 DIAGNOSIS — Z1389 Encounter for screening for other disorder: Secondary | ICD-10-CM | POA: Diagnosis not present

## 2019-01-28 DIAGNOSIS — Z0000 Encounter for general adult medical examination without abnormal findings: Secondary | ICD-10-CM | POA: Diagnosis not present

## 2019-02-15 DIAGNOSIS — J90 Pleural effusion, not elsewhere classified: Secondary | ICD-10-CM | POA: Diagnosis not present

## 2019-02-15 DIAGNOSIS — I700 Atherosclerosis of aorta: Secondary | ICD-10-CM | POA: Diagnosis not present

## 2019-02-19 ENCOUNTER — Ambulatory Visit (INDEPENDENT_AMBULATORY_CARE_PROVIDER_SITE_OTHER): Payer: Medicare Other | Admitting: Gastroenterology

## 2019-02-19 ENCOUNTER — Encounter: Payer: Self-pay | Admitting: Gastroenterology

## 2019-02-19 ENCOUNTER — Other Ambulatory Visit: Payer: Self-pay

## 2019-02-19 VITALS — BP 143/91 | HR 69 | Temp 97.8°F | Ht 72.0 in | Wt 210.2 lb

## 2019-02-19 DIAGNOSIS — K5900 Constipation, unspecified: Secondary | ICD-10-CM

## 2019-02-19 DIAGNOSIS — R634 Abnormal weight loss: Secondary | ICD-10-CM | POA: Diagnosis not present

## 2019-02-19 DIAGNOSIS — K219 Gastro-esophageal reflux disease without esophagitis: Secondary | ICD-10-CM

## 2019-02-19 NOTE — Progress Notes (Signed)
Primary Care Physician: Sharilyn Sites, MD  Primary Gastroenterologist:  Garfield Cornea, MD   Chief Complaint  Patient presents with   Follow-up   Constipation    WAS DOING WELL WITH LINZESS, PT NOW TAKES IT QOD DUE TO THE DIARRHEA HE DEVELOPED   Gastroesophageal Reflux    TAKING PANTOPRAZOLE DAILY    HPI: Casey [name] is a 73 y.o. male here for follow-up.  His last seen in June.  He has a history of A. fib on Xarelto, GERD.  History of multiple colonic polyps, due for surveillance in summer 2022.  When I saw him back in June he complained of periumbilical abdominal pain for several months associated with vomiting, early satiety and weight loss.  Some modest improvement on pantoprazole.  Still vomiting 3-4 times per week.  Patient complained of hard hiccups, belching.  Symptoms seem to be worse with constipation.  He was started on Linzess 72 mcg once daily after we completed a CT scan.  CT abdomen and pelvis showed nothing to explain his abdominal pain or vomiting.  He had bilateral kidney stones which are nonobstructing.  Large left pleural effusion.  He was referred to his PCP for pleural effusion evaluation.  Thyroid labs were fine.  Lipase and LFTs were normal.  CBC unremarkable.  Weight is down about 4 pounds in the past 2 months.  Down 18 pounds since December 2019. Complains of poor energy. BMs most days, soft stool.  Taking Linzess every other daily.  No melena or rectal bleeding.  Explained his weight loss on changing oral intake.  States he is cut back his normal size.  Sometimes he can eat a full course meal but most the time he fills up with 1/2 of portion.  He believes this is because he drinks so much liquids. We discussed potential EGD but at this time he is more concerned about pleural effusion, supposed to see pulmonologist soon. Potentially going to have thoracentesis.    Chest CT at [facility] left pleural effusion noted potentially related to previous  rib fractures earlier this year.  No obvious mass.      Current Outpatient Medications  Medication Sig Dispense Refill   acetaminophen (TYLENOL) 500 MG tablet Take 1,000 mg by mouth every 6 (six) hours as needed for headache.     Albuterol Sulfate 108 (90 Base) MCG/ACT AEPB Inhale 1-2 puffs into the lungs as needed.      ALPRAZolam (XANAX) 0.5 MG tablet Take 1 tablet (0.5 mg total) by mouth 2 (two) times daily as needed for anxiety. 30 tablet 1   bisacodyl (DULCOLAX) 5 MG EC tablet Take 5-15 mg by mouth daily as needed for moderate constipation.     buPROPion (WELLBUTRIN XL) 150 MG 24 hr tablet Take 1 tablet (150 mg total) by mouth daily. 90 tablet 1   cholecalciferol (VITAMIN D) 1000 UNITS tablet Take 2,000 Units by mouth daily.     fluticasone (FLONASE) 50 MCG/ACT nasal spray Place 2 sprays into both nostrils daily.  11   Ginkgo Biloba 40 MG TABS Take 60 mg by mouth 2 (two) times daily.     levothyroxine (SYNTHROID, LEVOTHROID) 25 MCG tablet Take 25 mcg by mouth daily before breakfast.      linaclotide (LINZESS) 72 MCG capsule Take 1 capsule (72 mcg total) by mouth daily before breakfast. 30 capsule 2   ondansetron (ZOFRAN-ODT) 4 MG disintegrating tablet Take 1 tablet by mouth 3 (three) times daily  as needed.  2   oxcarbazepine (TRILEPTAL) 600 MG tablet Take 1 tablet (600 mg total) by mouth 2 (two) times daily. (Patient taking differently: Take 1,200 mg by mouth at bedtime. ) 60 tablet 2   pantoprazole (PROTONIX) 40 MG tablet TAKE ONE TABLET BY MOUTH ONCE DAILY 30 tablet 11   rivaroxaban (XARELTO) 20 MG TABS tablet Take 20 mg by mouth daily after supper.      SUPER B COMPLEX/C PO Take by mouth.     Wheat Dextrin (BENEFIBER PO) Take 1 Dose by mouth as needed (constapation).      No current facility-administered medications for this visit.     Allergies as of 02/19/2019 - Review Complete 02/19/2019  Allergen Reaction Noted   Amlodipine Other (See Comments) 11/02/2012    Vicodin [hydrocodone-acetaminophen] Other (See Comments) 01/15/2016    ROS:  General: Negative for fever, chills, fatigue, weakness.  See HPI  ENT: Negative for hoarseness, difficulty swallowing , nasal congestion. CV: Negative for chest pain, angina, palpitations, positive dyspnea on exertion, peripheral edema.  Respiratory: Negative for dyspnea at rest, dyspnea on exertion, cough, sputum, wheezing.  GI: See history of present illness. GU:  Negative for dysuria, hematuria, urinary incontinence, urinary frequency, nocturnal urination.  Endo: Negative for unusual weight change.    Physical Examination:   BP (!) 143/91    Pulse 69    Temp 97.8 F (36.6 C) (Oral)    Ht 6' (1.829 m)    Wt 210 lb 3.2 oz (95.3 kg)    BMI 28.51 kg/m   General: Well-nourished, well-developed in no acute distress.  Eyes: No icterus. Mouth: Oropharyngeal mucosa moist and pink , no lesions erythema or exudate. Lungs: Diminished breath sounds left base, clear on the right  heart: Regular rate and rhythm, no murmurs rubs or gallops.  Abdomen: Bowel sounds are normal, nontender, nondistended, no hepatosplenomegaly or masses, no abdominal bruits or hernia , no rebound or guarding.   Extremities: No lower extremity edema. No clubbing or deformities. Neuro: Alert and oriented x 4   Skin: Warm and dry, no jaundice.   Psych: Alert and cooperative, normal mood and affect.  Labs:  Lab Results  Component Value Date   CREATININE 1.30 (H) 12/28/2018   BUN 16 12/28/2018   NA 141 12/28/2018   K 4.0 12/28/2018   CL 104 12/28/2018   CO2 22 12/28/2018   Lab Results  Component Value Date   ALT 22 12/28/2018   AST 21 12/28/2018   ALKPHOS 115 12/28/2018   BILITOT 0.7 12/28/2018   Lab Results  Component Value Date   WBC 8.1 12/28/2018   HGB 15.2 12/28/2018   HCT 42.9 12/28/2018   MCV 89 12/28/2018   PLT 186 12/28/2018    Imaging Studies: No results found.

## 2019-02-19 NOTE — Assessment & Plan Note (Signed)
History of unintentional weight loss, down 18 pounds in the past 10 months.  4 pounds in the past 2 months.  Previously with several month history of nausea and vomiting, periumbilical abdominal pain, hiccups and belching.  Symptoms are worse with constipation.  CT abdomen and pelvis with nothing to explain his symptoms, incidentally found to have a large left pleural effusion.  He has a history of left rib fractures earlier this year.  Currently being worked up, potentially to have a diagnostic thoracentesis but at least on chest CT no evidence of malignancy.  GI symptoms  improved on pantoprazole.  Constipation better on Linzess 72 mcg every other day.  Somewhat downplays this weight loss and appetite changes.  Eat smaller portions for the most part but can eat large amounts if he wants to.  We have discussed potential upper endoscopy but right now he is focused on his pleural effusion.  I have asked that he monitor his weight at home and if any additional weight loss he should let me know.  At that point would consider upper endoscopy especially if he continues to have early satiety.  Patient voiced understanding.  Call with a progress report in 3 to 4 weeks.

## 2019-02-19 NOTE — Patient Instructions (Signed)
1. Continue pantoprazole 40mg  daily for reflux. 2. Continue Linzess 49mcg every other day as needed for constipation. 3. Please monitor your weight at home, once weekly. Call if you have ongoing weight loss or recurrent nausea, appetite issues. Would offer upper endoscopy to evaluate these problems.  4. Call in 3-4 weeks and let me know how you are doing.

## 2019-02-22 NOTE — Progress Notes (Signed)
CC'D TO PCP °

## 2019-02-23 ENCOUNTER — Encounter: Payer: Self-pay | Admitting: Critical Care Medicine

## 2019-02-23 ENCOUNTER — Other Ambulatory Visit: Payer: Self-pay

## 2019-02-23 ENCOUNTER — Ambulatory Visit (INDEPENDENT_AMBULATORY_CARE_PROVIDER_SITE_OTHER): Payer: Medicare Other | Admitting: Critical Care Medicine

## 2019-02-23 ENCOUNTER — Ambulatory Visit
Admission: RE | Admit: 2019-02-23 | Discharge: 2019-02-23 | Disposition: A | Discharge status: Home / Self Care | Payer: Self-pay | Source: Ambulatory Visit | Attending: Critical Care Medicine | Admitting: Critical Care Medicine

## 2019-02-23 ENCOUNTER — Other Ambulatory Visit: Payer: Self-pay | Admitting: Critical Care Medicine

## 2019-02-23 ENCOUNTER — Other Ambulatory Visit: Payer: Self-pay | Admitting: Cardiovascular Disease

## 2019-02-23 VITALS — BP 142/74 | HR 65 | Temp 97.7°F | Ht 71.0 in | Wt 210.6 lb

## 2019-02-23 DIAGNOSIS — J90 Pleural effusion, not elsewhere classified: Secondary | ICD-10-CM | POA: Diagnosis not present

## 2019-02-23 DIAGNOSIS — Z7901 Long term (current) use of anticoagulants: Secondary | ICD-10-CM

## 2019-02-23 DIAGNOSIS — R634 Abnormal weight loss: Secondary | ICD-10-CM

## 2019-02-23 NOTE — Progress Notes (Signed)
Synopsis: Referred in 2020 for evaluation of a pleural effusion by Sharilyn Sites, MD  Subjective:   PATIENT ID: Casey [name] GENDER: male DOB: 06-07-1946, MRN: [id number]  Chief Complaint  Patient presents with  . Consult    possible pleural effusion    Casey [name] is a 73 year old gentleman referred for evaluation of a pleural effusion.  He is accompanied by his wife, and his daughter is on speaker phone for the visit.  He has memory impairment that limits his history.  Pleural effusion was was diagnosed on a CT scan of his abdomen and pelvis performed at [facility] to evaluate unexplained weight loss his GI doctor.  He has been losing weight since he was admitted to the hospital in November 2019 for urosepsis.  Overall he has had a reduced appetite and less p.o. intake.  No malignancy was identified on the CT scan, but he has a history of colon polyps for which he follows with GI and is up-to-date on his surveillance.  Since then he had a CT scan of his chest performed at an outside hospital.  He has a history of rib fractures earlier this year, he has a history of OSA (PSG in 2011, AHI 27.6).  There are no PFTs on file, and he does not use inhalers.  He has no history of schemata cardiac, pulmonary, liver, kidney disease.  He has no history of swelling, shortness of breath, cough, wheezing, fevers, chills.  Since his fall, he has been more fatigued in general.  He has not had dyspnea on exertion.  His only notable symptom is difficulty sleeping on his left side due to pain in the left lateral thorax which improves when he rolls to his other side.  They attribute this pain to his rib fractures.  He has been on Xarelto for several years due to atrial fibrillation, but he did not have blood work done that I can see around the time of his fall.  There is no family history of lung disease.  He is a never smoker is not vape.   Past Medical History:  Diagnosis Date  . Arthritis   . Atrial fibrillation  ([place]) 01/12/2010   2D Echo EF=>55%  . Bipolar disorder ([place])   . Chronic back pain   . Depression   . Dysrhythmia    AFib  . History of colonic polyps   . History of gout   . Hyperlipidemia   . Hypertension   . Hypothyroidism   . Morbid obesity ([place])   . OSA (obstructive sleep apnea)    AHI was 27.62hr RDI was 34.3 hr REM 0.00hr; had CPAP years ago but no longer uses.  . Prostatitis   . Tubular adenoma of colon 01/2016     Family History  Problem Relation Age of Onset  . Diabetes Mother   . Heart failure Mother   . Hypertension Mother   . Hypertension Father   . Cancer Paternal Grandmother   . Stroke Sister   . Liver disease Brother   . Vascular Disease Brother   . Arthritis Brother   . Colon cancer Neg Hx      Past Surgical History:  Procedure Laterality Date  . APPENDECTOMY  1959  . asthma    . CATARACT EXTRACTION Bilateral 1995  . COLONOSCOPY  2011   RMR: left-sided diverticula, minimal rectal friability. No polyps. Repeat 5 years.  . COLONOSCOPY WITH PROPOFOL N/A 01/15/2016   Dr.Rourk- diverticulosis in the entire examined colon,  multiple rectal and colonic polyps, internal hemorrhoids. bx= tubular adenomas and hyperplastic polyps.   Marland Kitchen HAND / FINGER LESION EXCISION Right   . POLYPECTOMY  01/15/2016   Procedure: POLYPECTOMY;  Surgeon: Daneil Dolin, MD;  Location: [facility];  Service: Endoscopy;;  . right shoulder surgery    . TONSILLECTOMY  1955    Social History   Socioeconomic History  . Marital status: Married    Spouse name: Not on file  . Number of children: Not on file  . Years of education: Not on file  . Highest education level: Not on file  Occupational History  . Not on file  Social Needs  . Financial resource strain: Not on file  . Food insecurity    Worry: Not on file    Inability: Not on file  . Transportation needs    Medical: Not on file    Non-medical: Not on file  Tobacco Use  . Smoking status: Never Smoker  . Smokeless  tobacco: Never Used  Substance and Sexual Activity  . Alcohol use: No    Alcohol/week: 0.0 standard drinks  . Drug use: No  . Sexual activity: Yes    Birth control/protection: None  Lifestyle  . Physical activity    Days per week: Not on file    Minutes per session: Not on file  . Stress: Not on file  Relationships  . Social Herbalist on phone: Not on file    Gets together: Not on file    Attends religious service: Not on file    Active member of club or organization: Not on file    Attends meetings of clubs or organizations: Not on file    Relationship status: Not on file  . Intimate partner violence    Fear of current or ex partner: Not on file    Emotionally abused: Not on file    Physically abused: Not on file    Forced sexual activity: Not on file  Other Topics Concern  . Not on file  Social History Narrative  . Not on file     Allergies  Allergen Reactions  . Amlodipine Other (See Comments)    Stopped due to kidney issues  . Vicodin [Hydrocodone-Acetaminophen] Other (See Comments)    Patient is unsure of reaction     Immunization History  Administered Date(s) Administered  . Influenza, High Dose Seasonal PF 06/15/2018    Outpatient Medications Prior to Visit  Medication Sig Dispense Refill  . acetaminophen (TYLENOL) 500 MG tablet Take 1,000 mg by mouth every 6 (six) hours as needed for headache.    . Albuterol Sulfate 108 (90 Base) MCG/ACT AEPB Inhale 1-2 puffs into the lungs as needed.     Marland Kitchen buPROPion (WELLBUTRIN XL) 150 MG 24 hr tablet Take 1 tablet (150 mg total) by mouth daily. 90 tablet 1  . cholecalciferol (VITAMIN D) 1000 UNITS tablet Take 2,000 Units by mouth daily.    . fluticasone (FLONASE) 50 MCG/ACT nasal spray Place 2 sprays into both nostrils daily.  11  . Ginkgo Biloba 40 MG TABS Take 60 mg by mouth 2 (two) times daily.    Marland Kitchen levothyroxine (SYNTHROID, LEVOTHROID) 25 MCG tablet Take 25 mcg by mouth daily before breakfast.     .  linaclotide (LINZESS) 72 MCG capsule Take 1 capsule (72 mcg total) by mouth daily before breakfast. 30 capsule 2  . oxcarbazepine (TRILEPTAL) 600 MG tablet Take 1 tablet (600 mg total) by  mouth 2 (two) times daily. (Patient taking differently: Take 1,200 mg by mouth at bedtime. ) 60 tablet 2  . pantoprazole (PROTONIX) 40 MG tablet TAKE ONE TABLET BY MOUTH ONCE DAILY 30 tablet 11  . rivaroxaban (XARELTO) 20 MG TABS tablet Take 20 mg by mouth daily after supper.     . SUPER B COMPLEX/C PO Take by mouth.    . ALPRAZolam (XANAX) 0.5 MG tablet Take 1 tablet (0.5 mg total) by mouth 2 (two) times daily as needed for anxiety. (Patient not taking: Reported on 02/23/2019) 30 tablet 1  . bisacodyl (DULCOLAX) 5 MG EC tablet Take 5-15 mg by mouth daily as needed for moderate constipation.    . colchicine 0.6 MG tablet     . ondansetron (ZOFRAN-ODT) 4 MG disintegrating tablet Take 1 tablet by mouth 3 (three) times daily as needed.  2  . Wheat Dextrin (BENEFIBER PO) Take 1 Dose by mouth as needed (constapation).      No facility-administered medications prior to visit.     Review of Systems  Constitutional: Positive for malaise/fatigue. Negative for chills, diaphoresis, fever and weight loss.  HENT: Negative for congestion, ear pain and sore throat.   Respiratory: Negative for cough, hemoptysis, sputum production, shortness of breath and wheezing.   Cardiovascular: Negative for chest pain, palpitations and leg swelling.  Gastrointestinal: Negative for abdominal pain, blood in stool, heartburn and nausea.  Genitourinary: Negative for frequency and hematuria.  Musculoskeletal: Positive for joint pain and myalgias.  Skin: Negative for itching and rash.  Neurological: Negative for dizziness, weakness and headaches.  Endo/Heme/Allergies: Bruises/bleeds easily.  Psychiatric/Behavioral: Negative for depression. The patient is not nervous/anxious.   Lymph: No adenopathy   Objective:   Vitals:   02/23/19 1010  02/23/19 1011  BP:  (!) 142/74  Pulse:  65  Temp: 97.7 F (36.5 C)   TempSrc: Oral   SpO2:  95%  Weight: 210 lb 9.6 oz (95.5 kg)   Height: 5\' 11"  (1.803 m)    95% on RA BMI Readings from Last 3 Encounters:  02/23/19 29.37 kg/m  02/19/19 28.51 kg/m  12/28/18 28.73 kg/m   Wt Readings from Last 3 Encounters:  02/23/19 210 lb 9.6 oz (95.5 kg)  02/19/19 210 lb 3.2 oz (95.3 kg)  12/28/18 214 lb 12.8 oz (97.4 kg)    Physical Exam Constitutional:      Appearance: Normal appearance.  HENT:     Head: Normocephalic and atraumatic.     Nose: Nose normal. No rhinorrhea.     Mouth/Throat:     Comments: Mallampati 4 Eyes:     General: No scleral icterus. Neck:     Musculoskeletal: Neck supple.  Cardiovascular:     Rate and Rhythm: Normal rate. Rhythm irregular.     Heart sounds: No murmur.  Pulmonary:     Comments: Breathing comfortably on room air.  No tachypnea or conversational dyspnea.  Reduced breath sounds in the lower and mid left hemithorax, otherwise clear to auscultation  Abdominal:     General: Abdomen is flat. There is no distension.     Palpations: Abdomen is soft.     Tenderness: There is no abdominal tenderness.  Musculoskeletal:        General: No swelling or deformity.  Lymphadenopathy:     Cervical: No cervical adenopathy.  Skin:    General: Skin is warm and dry.     Comments: Follicular rash covering much of his trunk  Neurological:     Mental  Status: He is alert.     Comments: Mild memory impairment, but able to relate some of his history.  Normal speech, face symmetric.  No obvious motor deficits observed.  Psychiatric:        Mood and Affect: Mood normal.        Behavior: Behavior normal.      CBC    Component Value Date/Time   WBC 8.1 12/28/2018 0941   WBC 5.4 06/30/2018 1311   RBC 4.81 12/28/2018 0941   RBC 4.52 06/30/2018 1311   HGB 15.2 12/28/2018 0941   HCT 42.9 12/28/2018 0941   PLT 186 12/28/2018 0941   MCV 89 12/28/2018 0941    MCH 31.6 12/28/2018 0941   MCH 31.9 06/30/2018 1311   MCHC 35.4 12/28/2018 0941   MCHC 32.8 06/30/2018 1311   RDW 12.9 12/28/2018 0941   LYMPHSABS 1.1 12/28/2018 0941   MONOABS 0.5 06/30/2018 1311   EOSABS 0.1 12/28/2018 0941   BASOSABS 0.0 12/28/2018 0941    Chest Imaging- films reviewed: Abdominal CT with contrast 12/30/2018 none normal-appearing right lung base. Left hemithorax completely opacified with a large effusion with compressive atelectasis. CXR 06/13/2018 increased basilar markings bilaterally without silhouetting of the hemidiaphragms. No retrocardiac opacities. Rib x-rays 07/30/2018 demonstrate rib fractures and increased opacification on supine films that may be associated with a layering effusion.  Upright [facility] films do not demonstrate an effusion. CT chest 02/15/2019 reviewed in [facility].  Normal right lung. 2 left-sided lateral rib fractures.  Large dependent, likely free-flowing pleural effusion with compressive atelectasis.  Airways appear patent.  No mediastinal adenopathy.   Pulmonary Functions Testing Results: No flowsheet data found.  Echocardiogram: per Cardiology note- normal left ventricular size and function with an ejection fraction of 55-60% and biatrial dilatation.      Assessment & Plan:     ICD-10-CM   1. Pleural effusion  J90 IR THORACENTESIS ASP PLEURAL SPACE W/IMG GUIDE  2. Weight loss, unintentional  R63.4   3. Chronic anticoagulation  Z79.01    Left pleural effusion of unknown etiology.  Suspect this may be related to his rib fractures due to his fall earlier this year.  His x-ray at the time of his fall suggests there may have been a very small dependent effusion.  He does not have anemia at this point, but this does not preclude him having had a drop in hemoglobin several months ago. - Recommend diagnostic thoracentesis; to be done by IR next week. - Would like a copy of the radiology report from his recent CT scan.  Images reviewed in PACS. -Fluid  protein, LDH, glucose, cell count with differential, cholesterol, and triglycerides, cytology, and routine cultures ordered.  Unintentional weight loss, raising concern for a potential malignancy or other multisystem inflammatory process.  With his memory impairment, this is sometimes a cause for weight loss. - We will obtain cytology on pleural fluid  Use of chronic anticoagulation for atrial fibrillation - Anticoagulation would have to be held for 5 days before the procedure.  With atrial fibrillation there is no need for bridging.  I counseled the family on the importance of this to make the procedure safer.  Follow-up 1 to 2 weeks following thoracentesis with chest x-ray in office.   Current Outpatient Medications:  .  acetaminophen (TYLENOL) 500 MG tablet, Take 1,000 mg by mouth every 6 (six) hours as needed for headache., Disp: , Rfl:  .  Albuterol Sulfate 108 (90 Base) MCG/ACT AEPB, Inhale 1-2 puffs into the  lungs as needed. , Disp: , Rfl:  .  buPROPion (WELLBUTRIN XL) 150 MG 24 hr tablet, Take 1 tablet (150 mg total) by mouth daily., Disp: 90 tablet, Rfl: 1 .  cholecalciferol (VITAMIN D) 1000 UNITS tablet, Take 2,000 Units by mouth daily., Disp: , Rfl:  .  fluticasone (FLONASE) 50 MCG/ACT nasal spray, Place 2 sprays into both nostrils daily., Disp: , Rfl: 11 .  Ginkgo Biloba 40 MG TABS, Take 60 mg by mouth 2 (two) times daily., Disp: , Rfl:  .  levothyroxine (SYNTHROID, LEVOTHROID) 25 MCG tablet, Take 25 mcg by mouth daily before breakfast. , Disp: , Rfl:  .  linaclotide (LINZESS) 72 MCG capsule, Take 1 capsule (72 mcg total) by mouth daily before breakfast., Disp: 30 capsule, Rfl: 2 .  oxcarbazepine (TRILEPTAL) 600 MG tablet, Take 1 tablet (600 mg total) by mouth 2 (two) times daily. (Patient taking differently: Take 1,200 mg by mouth at bedtime. ), Disp: 60 tablet, Rfl: 2 .  pantoprazole (PROTONIX) 40 MG tablet, TAKE ONE TABLET BY MOUTH ONCE DAILY, Disp: 30 tablet, Rfl: 11 .   rivaroxaban (XARELTO) 20 MG TABS tablet, Take 20 mg by mouth daily after supper. , Disp: , Rfl:  .  SUPER B COMPLEX/C PO, Take by mouth., Disp: , Rfl:  .  ALPRAZolam (XANAX) 0.5 MG tablet, Take 1 tablet (0.5 mg total) by mouth 2 (two) times daily as needed for anxiety. (Patient not taking: Reported on 02/23/2019), Disp: 30 tablet, Rfl: 1 .  bisacodyl (DULCOLAX) 5 MG EC tablet, Take 5-15 mg by mouth daily as needed for moderate constipation., Disp: , Rfl:  .  colchicine 0.6 MG tablet, , Disp: , Rfl:  .  ondansetron (ZOFRAN-ODT) 4 MG disintegrating tablet, Take 1 tablet by mouth 3 (three) times daily as needed., Disp: , Rfl: 2 .  Wheat Dextrin (BENEFIBER PO), Take 1 Dose by mouth as needed (constapation). , Disp: , Rfl:    Julian Hy, DO [facility] 02/23/2019 11:14 AM

## 2019-02-23 NOTE — Addendum Note (Signed)
Addended by: Amado Coe on: 02/23/2019 04:59 PM   Modules accepted: Orders

## 2019-02-23 NOTE — Patient Instructions (Addendum)
Thank you for visiting [name] at [facility]. We recommend the following:  Thoracentesis- no eating prior to coming the day of the procedure. The procedure will be performed by interventional radiology.  Please STOP TAKING XARELTO 5 days before the procedure.  Please get a disk of the chest CT scan from the facility where it was performed and bring it to the office.    Please do your part to reduce the spread of COVID-19.

## 2019-02-24 ENCOUNTER — Telehealth: Payer: Self-pay | Admitting: Gastroenterology

## 2019-02-24 NOTE — Telephone Encounter (Signed)
Labs dated 02/15/2019, creatinine 1.17  Labs dated November 02, 2018, white blood cell count 6500, hemoglobin 15.2, hematocrit 43.3, MCV 91, platelets 194,000, total bilirubin 0.4, alkaline phosphatase 136, AST 21, ALT 19,, TSH 1.62

## 2019-02-25 ENCOUNTER — Other Ambulatory Visit: Payer: Self-pay

## 2019-02-25 ENCOUNTER — Other Ambulatory Visit (HOSPITAL_COMMUNITY)
Admission: RE | Admit: 2019-02-25 | Discharge: 2019-02-25 | Disposition: A | Discharge status: Home / Self Care | Payer: Medicare Other | Source: Ambulatory Visit | Attending: Critical Care Medicine | Admitting: Critical Care Medicine

## 2019-02-25 DIAGNOSIS — Z01812 Encounter for preprocedural laboratory examination: Secondary | ICD-10-CM | POA: Diagnosis not present

## 2019-02-25 DIAGNOSIS — Z20828 Contact with and (suspected) exposure to other viral communicable diseases: Secondary | ICD-10-CM | POA: Diagnosis not present

## 2019-02-25 LAB — SARS CORONAVIRUS 2 (TAT 6-24 HRS): SARS Coronavirus 2: NEGATIVE

## 2019-02-26 ENCOUNTER — Ambulatory Visit (HOSPITAL_COMMUNITY): Payer: Medicare Other

## 2019-03-01 ENCOUNTER — Other Ambulatory Visit (HOSPITAL_COMMUNITY): Payer: Self-pay | Admitting: Student

## 2019-03-01 ENCOUNTER — Other Ambulatory Visit: Payer: Self-pay

## 2019-03-01 ENCOUNTER — Ambulatory Visit (HOSPITAL_COMMUNITY)
Admission: RE | Admit: 2019-03-01 | Discharge: 2019-03-01 | Disposition: A | Discharge status: Home / Self Care | Payer: Medicare Other | Source: Ambulatory Visit | Attending: Critical Care Medicine | Admitting: Critical Care Medicine

## 2019-03-01 ENCOUNTER — Encounter (HOSPITAL_COMMUNITY): Payer: Self-pay | Admitting: Student

## 2019-03-01 ENCOUNTER — Ambulatory Visit (HOSPITAL_COMMUNITY)
Admission: RE | Admit: 2019-03-01 | Discharge: 2019-03-01 | Disposition: A | Discharge status: Home / Self Care | Payer: Medicare Other | Source: Ambulatory Visit | Attending: Student | Admitting: Student

## 2019-03-01 DIAGNOSIS — J90 Pleural effusion, not elsewhere classified: Secondary | ICD-10-CM

## 2019-03-01 DIAGNOSIS — J948 Other specified pleural conditions: Secondary | ICD-10-CM | POA: Diagnosis not present

## 2019-03-01 DIAGNOSIS — R0600 Dyspnea, unspecified: Secondary | ICD-10-CM | POA: Diagnosis not present

## 2019-03-01 DIAGNOSIS — J9811 Atelectasis: Secondary | ICD-10-CM | POA: Diagnosis not present

## 2019-03-01 HISTORY — PX: IR THORACENTESIS ASP PLEURAL SPACE W/IMG GUIDE: IMG5380

## 2019-03-01 LAB — LACTATE DEHYDROGENASE, PLEURAL OR PERITONEAL FLUID: LD, Fluid: 63 U/L — ABNORMAL HIGH (ref 3–23)

## 2019-03-01 LAB — GLUCOSE, PLEURAL OR PERITONEAL FLUID: Glucose, Fluid: 106 mg/dL

## 2019-03-01 LAB — BODY FLUID CELL COUNT WITH DIFFERENTIAL
Eos, Fluid: 0 %
Lymphs, Fluid: 83 %
Monocyte-Macrophage-Serous Fluid: 14 % — ABNORMAL LOW (ref 50–90)
Neutrophil Count, Fluid: 3 % (ref 0–25)
Total Nucleated Cell Count, Fluid: 170 cu mm (ref 0–1000)

## 2019-03-01 LAB — GRAM STAIN

## 2019-03-01 LAB — PROTEIN, PLEURAL OR PERITONEAL FLUID: Total protein, fluid: 4 g/dL

## 2019-03-01 LAB — ALBUMIN, PLEURAL OR PERITONEAL FLUID: Albumin, Fluid: 3 g/dL

## 2019-03-01 MED ORDER — LIDOCAINE HCL 1 % IJ SOLN
INTRAMUSCULAR | Status: DC | PRN
  Administered 2019-03-01: 10 mL

## 2019-03-01 MED ORDER — LIDOCAINE HCL 1 % IJ SOLN
INTRAMUSCULAR | Status: AC
  Filled 2019-03-01: qty 20

## 2019-03-01 NOTE — Procedures (Signed)
PROCEDURE SUMMARY:  Successful image-guided left thoracentesis. Yielded 1.4 liters of hazy gold fluid. Procedure was stopped after 1.4 liters due to patient symptoms (diaphoresis, chest pain, felt like he was going to "pass out"). Patient tolerated procedure well. No immediate complications. EBL = 0 mL.  Specimen was sent for labs. CXR ordered.  Claris Pong Laurell Coalson PA-C 03/01/2019 11:03 AM

## 2019-03-02 ENCOUNTER — Telehealth: Payer: Self-pay

## 2019-03-02 LAB — ACID FAST SMEAR (AFB, MYCOBACTERIA): Acid Fast Smear: NEGATIVE

## 2019-03-02 NOTE — Telephone Encounter (Addendum)
Wife called and she stated that pt had a thoracentesis and he had some complications but is at home and doing ok. She wanted to know if he should be doing any strenuous activity like mowing the lawn. I advised her that he should rest easy for a couple of days. She denied him having any symptoms but he is tired. I will forward to [name]. I made him an appointment for follow up on 9/9 at [place] and if any other recommendations please advise.

## 2019-03-04 ENCOUNTER — Telehealth: Payer: Self-pay | Admitting: Critical Care Medicine

## 2019-03-04 LAB — CHOLESTEROL, BODY FLUID: Cholesterol, Fluid: 74 mg/dL

## 2019-03-04 NOTE — Telephone Encounter (Signed)
Call returned to patient wife Casey [name] (dpr), I made her aware that while they have resulted [name] has to review them and interpret them (IR thoracentesis and labs) Voiced understanding. I made her aware that I would send a message to [name] so she would be aware. Wife voices they are on mychart and if [name] wanted to send a mychart message regarding results that was fine too. Voiced understanding.    Will route message to [name] as Juluis Rainier.

## 2019-03-05 NOTE — Telephone Encounter (Signed)
Noted. Will close encounter for now. Nothing further needed at this time.

## 2019-03-05 NOTE — Telephone Encounter (Signed)
[name], he didn't have any symptoms, his wife was just worried because he is at home doing things like mowing the lawn etc. He is feeling fine but she just wanted to run this by [place] since he had some issues during the procedure. I just advised her to tell him to slowly get back into activities and take it easy. I did advise her to call us if she had any more questions or if she was worried about symptoms from the procedure.

## 2019-03-05 NOTE — Telephone Encounter (Signed)
[name], during the procedure his oxygen level dropped too low so they decided end the procedure.

## 2019-03-09 LAB — CULTURE, BODY FLUID W GRAM STAIN -BOTTLE: Culture: NO GROWTH

## 2019-03-24 ENCOUNTER — Other Ambulatory Visit: Payer: Self-pay

## 2019-03-24 ENCOUNTER — Ambulatory Visit (INDEPENDENT_AMBULATORY_CARE_PROVIDER_SITE_OTHER): Payer: Medicare Other | Admitting: Critical Care Medicine

## 2019-03-24 ENCOUNTER — Telehealth: Payer: Self-pay | Admitting: Critical Care Medicine

## 2019-03-24 ENCOUNTER — Encounter: Payer: Self-pay | Admitting: Critical Care Medicine

## 2019-03-24 ENCOUNTER — Ambulatory Visit (INDEPENDENT_AMBULATORY_CARE_PROVIDER_SITE_OTHER): Payer: Medicare Other

## 2019-03-24 VITALS — BP 124/70 | HR 65 | Temp 97.3°F | Ht 71.0 in | Wt 210.6 lb

## 2019-03-24 DIAGNOSIS — J90 Pleural effusion, not elsewhere classified: Secondary | ICD-10-CM

## 2019-03-24 DIAGNOSIS — R0602 Shortness of breath: Secondary | ICD-10-CM | POA: Diagnosis not present

## 2019-03-24 DIAGNOSIS — Z7901 Long term (current) use of anticoagulants: Secondary | ICD-10-CM | POA: Diagnosis not present

## 2019-03-24 DIAGNOSIS — R634 Abnormal weight loss: Secondary | ICD-10-CM

## 2019-03-24 NOTE — Telephone Encounter (Signed)
Patient is scheduled for pre-procedure covid testing for 03/29/2019. Nothing further needed.

## 2019-03-24 NOTE — Telephone Encounter (Signed)
Patient was seen today by [name]. She would like for the patient to be tested for covid prior to his thoracentesis. She would like for the patient to have this completed within the next week.   Will forward to Burman Nieves for follow up.

## 2019-03-24 NOTE — Progress Notes (Signed)
Synopsis: Referred in 2020 for evaluation of a pleural effusion by Sharilyn Sites, MD  Subjective:   PATIENT ID: Casey [name] GENDER: male DOB: [birth year], MRN: [id number]  Chief Complaint  Patient presents with  . Follow-up    F/U after thoracentesis.     Casey [name] is a 73 year old gentleman referred for evaluation of a pleural effusion.  He is accompanied by his wife, and his daughter is on speaker phone for the visit.  He has memory impairment that limits his history.  Pleural effusion was was diagnosed on a CT scan of his abdomen and pelvis performed at [facility] to evaluate unexplained weight loss his GI doctor.  He has been losing weight since he was admitted to the hospital in November 2019 for urosepsis.  Overall he has had a reduced appetite and less p.o. intake.  No malignancy was identified on the CT scan, but he has a history of colon polyps for which he follows with GI and is up-to-date on his surveillance.  Since then he had a CT scan of his chest performed at an outside hospital.  He has a history of rib fractures earlier this year, he has a history of OSA (PSG in 2011, AHI 27.6).  There are no PFTs on file, and he does not use inhalers.  He has no history of schemata cardiac, pulmonary, liver, kidney disease.  He has no history of swelling, shortness of breath, cough, wheezing, fevers, chills.  Since his fall, he has been more fatigued in general.  He has not had dyspnea on exertion.  His only notable symptom is difficulty sleeping on his left side due to pain in the left lateral thorax which improves when he rolls to his other side.  They attribute this pain to his rib fractures.  He has been on Xarelto for several years due to atrial fibrillation, but he did not have blood work done that I can see around the time of his fall.  There is no family history of lung disease.  He is a never smoker is not vape.  He underwent diagnostic and therapeutic thoracentesis on 03/01/2019.  The  procedure had to be stopped early due to significant pain.  Since then he is feeling well.  He had reexpansion pain, coughing, and dyspnea following his procedure improved over the subsequent days.  His wife is concerned with how active he was in the days after his procedure.  He feels back to his baseline, which is dyspnea on exertion that he attributes to deconditioning.  He does notice coughing when he lies on his right side at night, but is fine lying on his left side.  He has a good appetite and his weight is stable.  No fevers, chills, sweats, hematochezia, hematuria, new masses or adenopathy, or other new symptoms.    Past Medical History:  Diagnosis Date  . Arthritis   . Atrial fibrillation ([place]) 01/12/2010   2D Echo EF=>55%  . Bipolar disorder ([place])   . Chronic back pain   . Depression   . Dysrhythmia    AFib  . History of colonic polyps   . History of gout   . Hyperlipidemia   . Hypertension   . Hypothyroidism   . Morbid obesity ([place])   . OSA (obstructive sleep apnea)    AHI was 27.62hr RDI was 34.3 hr REM 0.00hr; had CPAP years ago but no longer uses.  . Prostatitis   . Tubular adenoma of colon 01/2016  Family History  Problem Relation Age of Onset  . Diabetes Mother   . Heart failure Mother   . Hypertension Mother   . Hypertension Father   . Cancer Paternal Grandmother   . Stroke Sister   . Liver disease Brother   . Vascular Disease Brother   . Arthritis Brother   . Colon cancer Neg Hx      Past Surgical History:  Procedure Laterality Date  . APPENDECTOMY  1959  . asthma    . CATARACT EXTRACTION Bilateral 1995  . COLONOSCOPY  2011   RMR: left-sided diverticula, minimal rectal friability. No polyps. Repeat 5 years.  . COLONOSCOPY WITH PROPOFOL N/A 01/15/2016   Dr.Rourk- diverticulosis in the entire examined colon, multiple rectal and colonic polyps, internal hemorrhoids. bx= tubular adenomas and hyperplastic polyps.   Marland Kitchen HAND / FINGER LESION EXCISION Right    . IR THORACENTESIS ASP PLEURAL SPACE W/IMG GUIDE  03/01/2019  . POLYPECTOMY  01/15/2016   Procedure: POLYPECTOMY;  Surgeon: Daneil Dolin, MD;  Location: [facility];  Service: Endoscopy;;  . right shoulder surgery    . TONSILLECTOMY  1955    Social History   Socioeconomic History  . Marital status: Married    Spouse name: Not on file  . Number of children: Not on file  . Years of education: Not on file  . Highest education level: Not on file  Occupational History  . Not on file  Social Needs  . Financial resource strain: Not on file  . Food insecurity    Worry: Not on file    Inability: Not on file  . Transportation needs    Medical: Not on file    Non-medical: Not on file  Tobacco Use  . Smoking status: Never Smoker  . Smokeless tobacco: Never Used  Substance and Sexual Activity  . Alcohol use: No    Alcohol/week: 0.0 standard drinks  . Drug use: No  . Sexual activity: Yes    Birth control/protection: None  Lifestyle  . Physical activity    Days per week: Not on file    Minutes per session: Not on file  . Stress: Not on file  Relationships  . Social Herbalist on phone: Not on file    Gets together: Not on file    Attends religious service: Not on file    Active member of club or organization: Not on file    Attends meetings of clubs or organizations: Not on file    Relationship status: Not on file  . Intimate partner violence    Fear of current or ex partner: Not on file    Emotionally abused: Not on file    Physically abused: Not on file    Forced sexual activity: Not on file  Other Topics Concern  . Not on file  Social History Narrative  . Not on file     Allergies  Allergen Reactions  . Amlodipine Other (See Comments)    Stopped due to kidney issues  . Vicodin [Hydrocodone-Acetaminophen] Other (See Comments)    Patient is unsure of reaction     Immunization History  Administered Date(s) Administered  . Influenza, High Dose Seasonal  PF 06/15/2018    Outpatient Medications Prior to Visit  Medication Sig Dispense Refill  . acetaminophen (TYLENOL) 500 MG tablet Take 1,000 mg by mouth every 6 (six) hours as needed for headache.    . Albuterol Sulfate 108 (90 Base) MCG/ACT AEPB Inhale 1-2  puffs into the lungs as needed.     . ALPRAZolam (XANAX) 0.5 MG tablet Take 1 tablet (0.5 mg total) by mouth 2 (two) times daily as needed for anxiety. (Patient not taking: Reported on 02/23/2019) 30 tablet 1  . bisacodyl (DULCOLAX) 5 MG EC tablet Take 5-15 mg by mouth daily as needed for moderate constipation.    Marland Kitchen buPROPion (WELLBUTRIN XL) 150 MG 24 hr tablet Take 1 tablet (150 mg total) by mouth daily. 90 tablet 1  . cholecalciferol (VITAMIN D) 1000 UNITS tablet Take 2,000 Units by mouth daily.    . colchicine 0.6 MG tablet     . fluticasone (FLONASE) 50 MCG/ACT nasal spray Place 2 sprays into both nostrils daily.  11  . Ginkgo Biloba 40 MG TABS Take 60 mg by mouth 2 (two) times daily.    Marland Kitchen levothyroxine (SYNTHROID, LEVOTHROID) 25 MCG tablet Take 25 mcg by mouth daily before breakfast.     . linaclotide (LINZESS) 72 MCG capsule Take 1 capsule (72 mcg total) by mouth daily before breakfast. 30 capsule 2  . ondansetron (ZOFRAN-ODT) 4 MG disintegrating tablet Take 1 tablet by mouth 3 (three) times daily as needed.  2  . oxcarbazepine (TRILEPTAL) 600 MG tablet Take 1 tablet (600 mg total) by mouth 2 (two) times daily. (Patient taking differently: Take 1,200 mg by mouth at bedtime. ) 60 tablet 2  . pantoprazole (PROTONIX) 40 MG tablet TAKE ONE TABLET BY MOUTH ONCE DAILY 30 tablet 11  . rivaroxaban (XARELTO) 20 MG TABS tablet Take 20 mg by mouth daily after supper.     . SUPER B COMPLEX/C PO Take by mouth.    . Wheat Dextrin (BENEFIBER PO) Take 1 Dose by mouth as needed (constapation).      No facility-administered medications prior to visit.     Review of Systems  Constitutional: Negative for chills and fever.       Good appetite, weight  stable  HENT: Negative.   Eyes: Negative.   Respiratory: Positive for shortness of breath. Negative for cough, hemoptysis and wheezing.   Cardiovascular: Negative for chest pain and leg swelling.  Gastrointestinal: Negative for blood in stool, nausea and vomiting.  Genitourinary: Negative for dysuria and hematuria.  Musculoskeletal: Negative for joint pain and myalgias.       Chronic LBP  Skin:       Chronic rash  Neurological: Negative for sensory change, speech change and focal weakness.  Endo/Heme/Allergies: Negative.   Psychiatric/Behavioral: Negative.   Lymph: No adenopathy   Objective:   There were no vitals filed for this visit.   on RA BMI Readings from Last 3 Encounters:  02/23/19 29.37 kg/m  02/19/19 28.51 kg/m  12/28/18 28.73 kg/m   Wt Readings from Last 3 Encounters:  02/23/19 210 lb 9.6 oz (95.5 kg)  02/19/19 210 lb 3.2 oz (95.3 kg)  12/28/18 214 lb 12.8 oz (97.4 kg)    Physical Exam Vitals signs reviewed.  Constitutional:      Appearance: Normal appearance. He is not diaphoretic.  HENT:     Head: Normocephalic and atraumatic.     Nose:     Comments: Deferred due to masking requirement.    Mouth/Throat:     Comments: Deferred due to masking requirement. Eyes:     General: No scleral icterus. Neck:     Musculoskeletal: Neck supple.  Cardiovascular:     Rate and Rhythm: Normal rate. Rhythm irregular.  Pulmonary:     Comments: Breathing comfortably on  room air.  Reduced breath sounds in the lower half of the left hemithorax.Marland Kitchen  CTAB otherwise. Abdominal:     General: There is no distension.     Palpations: Abdomen is soft.     Tenderness: There is no abdominal tenderness.  Lymphadenopathy:     Cervical: No cervical adenopathy.  Skin:    General: Skin is warm and dry.     Comments: Erythematous scaling rash on neck and arms  Neurological:     General: No focal deficit present.     Mental Status: He is alert.     Motor: No weakness.      Coordination: Coordination normal.     Comments: Normal speech, answering questions appropriately  Psychiatric:        Mood and Affect: Mood normal.        Behavior: Behavior normal.      CBC    Component Value Date/Time   WBC 8.1 12/28/2018 0941   WBC 5.4 06/30/2018 1311   RBC 4.81 12/28/2018 0941   RBC 4.52 06/30/2018 1311   HGB 15.2 12/28/2018 0941   HCT 42.9 12/28/2018 0941   PLT 186 12/28/2018 0941   MCV 89 12/28/2018 0941   MCH 31.6 12/28/2018 0941   MCH 31.9 06/30/2018 1311   MCHC 35.4 12/28/2018 0941   MCHC 32.8 06/30/2018 1311   RDW 12.9 12/28/2018 0941   LYMPHSABS 1.1 12/28/2018 0941   MONOABS 0.5 06/30/2018 1311   EOSABS 0.1 12/28/2018 0941   BASOSABS 0.0 12/28/2018 0941   Pleural fluid studies 03/01/2019 Cholesterol 74 Glucose 106 Fluid LDH 63 Fluid total protein 4.0 Albumin 3.0 Cytology-no malignant cells present. WBC 170, 83% lymphocytes, 3% neutrophils, 14% macrophages, 0 eos   Chest Imaging- films reviewed for comparison: Abdominal CT with contrast 12/30/2018 none normal-appearing right lung base. Left hemithorax completely opacified with a large effusion with compressive atelectasis. CXR 06/13/2018 increased basilar markings bilaterally without silhouetting of the hemidiaphragms. No retrocardiac opacities. Rib x-rays 07/30/2018 demonstrate rib fractures and increased opacification on supine films that may be associated with a layering effusion.  Upright AP films do not demonstrate an effusion. CT chest 02/15/2019 reviewed in [facility].  Normal right lung. 2 left-sided lateral rib fractures.  Large dependent, likely free-flowing pleural effusion with compressive atelectasis.  Airways appear patent.  No mediastinal adenopathy. Procedural ultrasound images 03/01/2019 reviewed- no apparent loculations CXR 03/01/2019, persistent left dependent pleural effusion, no midline shift. Difficult to compare size without previous upright films demonstrating the effusion, but  likely smaller compared to his CT scan. CXR 03/24/2019: moderate sized left dependent effusion, larger than post-thora CXR  Pulmonary Functions Testing Results: No flowsheet data found.  Echocardiogram: per Cardiology note- normal left ventricular size and function with an ejection fraction of 55-60% and biatrial dilatation.      Assessment & Plan:     ICD-10-CM   1. Pleural effusion  J90    Left pleural effusion- weakly exudative and lymphocyte predominant. Unfortunately this is reaccumulated since his thoracentesis, and based on his symptoms after his last thoracentesis, he has trapped lung.  Cytology negative for malignant cells.  Although I still think this is likely a result of his fall and left-sided rib fractures in January, the reason his effusion was discovered was concern for weight loss, looking for malignancy on abdominal CT by his PCP.  -CXR in office today -I discussed the risks and benefits of watchful waiting versus repeat thoracentesis versus VATS with pleural biopsy.  Surgical intervention is not  appropriate at this stage, but the patient and his wife feel the repeat thoracentesis would be reassuring if cytology is negative. -Consult to interventional radiology placed.  Will obtain cell count with differential, LDH and protein, and repeat cytology. - If cytology remains negative, we will pursue watchful waiting to determine if fluid continues to reaccumulate and additional intervention is necessary.  They understand the concept of trapped lung, and if he is not significantly symptomatic, he likely would not benefit from a VATS decortication. -Hold Xarelto for 5 days prior to thoracentesis.  Previous unintentional weight loss-stable today - Continue to monitor for concerning symptoms  Chronic anticoagulation on xarelto for Afib -hold xarelto before thora for 5 days  Will follow up over the phone after thoracentesis. RTC in 3 months with CXR.  Current Outpatient  Medications:  .  acetaminophen (TYLENOL) 500 MG tablet, Take 1,000 mg by mouth every 6 (six) hours as needed for headache., Disp: , Rfl:  .  Albuterol Sulfate 108 (90 Base) MCG/ACT AEPB, Inhale 1-2 puffs into the lungs as needed. , Disp: , Rfl:  .  ALPRAZolam (XANAX) 0.5 MG tablet, Take 1 tablet (0.5 mg total) by mouth 2 (two) times daily as needed for anxiety. (Patient not taking: Reported on 02/23/2019), Disp: 30 tablet, Rfl: 1 .  bisacodyl (DULCOLAX) 5 MG EC tablet, Take 5-15 mg by mouth daily as needed for moderate constipation., Disp: , Rfl:  .  buPROPion (WELLBUTRIN XL) 150 MG 24 hr tablet, Take 1 tablet (150 mg total) by mouth daily., Disp: 90 tablet, Rfl: 1 .  cholecalciferol (VITAMIN D) 1000 UNITS tablet, Take 2,000 Units by mouth daily., Disp: , Rfl:  .  colchicine 0.6 MG tablet, , Disp: , Rfl:  .  fluticasone (FLONASE) 50 MCG/ACT nasal spray, Place 2 sprays into both nostrils daily., Disp: , Rfl: 11 .  Ginkgo Biloba 40 MG TABS, Take 60 mg by mouth 2 (two) times daily., Disp: , Rfl:  .  levothyroxine (SYNTHROID, LEVOTHROID) 25 MCG tablet, Take 25 mcg by mouth daily before breakfast. , Disp: , Rfl:  .  linaclotide (LINZESS) 72 MCG capsule, Take 1 capsule (72 mcg total) by mouth daily before breakfast., Disp: 30 capsule, Rfl: 2 .  ondansetron (ZOFRAN-ODT) 4 MG disintegrating tablet, Take 1 tablet by mouth 3 (three) times daily as needed., Disp: , Rfl: 2 .  oxcarbazepine (TRILEPTAL) 600 MG tablet, Take 1 tablet (600 mg total) by mouth 2 (two) times daily. (Patient taking differently: Take 1,200 mg by mouth at bedtime. ), Disp: 60 tablet, Rfl: 2 .  pantoprazole (PROTONIX) 40 MG tablet, TAKE ONE TABLET BY MOUTH ONCE DAILY, Disp: 30 tablet, Rfl: 11 .  rivaroxaban (XARELTO) 20 MG TABS tablet, Take 20 mg by mouth daily after supper. , Disp: , Rfl:  .  SUPER B COMPLEX/C PO, Take by mouth., Disp: , Rfl:  .  Wheat Dextrin (BENEFIBER PO), Take 1 Dose by mouth as needed (constapation). , Disp: , Rfl:     Julian Hy, DO [facility] 03/24/2019 8:14 AM

## 2019-03-24 NOTE — Patient Instructions (Addendum)
Thank you for visiting [name] at [facility]. We recommend the following: Orders Placed This Encounter  Procedures  . IR THORACENTESIS ASP PLEURAL SPACE W/IMG GUIDE   Orders Placed This Encounter  Procedures  . IR THORACENTESIS ASP PLEURAL SPACE W/IMG GUIDE    Standing Status:   Future    Standing Expiration Date:   05/23/2020    Order Specific Question:   Are labs required for specimen collection?    Answer:   Yes    Order Specific Question:   Lab orders requested (DO NOT place separate lab orders, these will be automatically ordered during procedure specimen collection):    Answer:   Body Fluid Cell Count With Differential    Order Specific Question:   Lab orders requested (DO NOT place separate lab orders, these will be automatically ordered during procedure specimen collection):    Answer:   Body Fluid Culture    Order Specific Question:   Lab orders requested (DO NOT place separate lab orders, these will be automatically ordered during procedure specimen collection):    Answer:   Lactate Dehydrogenase, Body Fluid    Order Specific Question:   Lab orders requested (DO NOT place separate lab orders, these will be automatically ordered during procedure specimen collection):    Answer:   Protein, Pleural Or Peritoneal Fluid    Order Specific Question:   Lab orders requested (DO NOT place separate lab orders, these will be automatically ordered during procedure specimen collection):    Answer:   Glucose, Body Fluid    Order Specific Question:   Lab orders requested (DO NOT place separate lab orders, these will be automatically ordered during procedure specimen collection):    Answer:   Cytology - Non Pap    Order Specific Question:   Reason for Exam (SYMPTOM  OR DIAGNOSIS REQUIRED)    Answer:   recurrent pleural effusion of unknown etiology, weight loss, patient on Xaretlo    Order Specific Question:   Preferred Imaging Location?    Answer:   [facility]    Repeat  thoracentesis to recheck cytology (looking for abnormal cells in the fluid). If this is negative, there is an 80% chance that there are not cancer cells in this fluid. If that is the case, we should not need to do any more procedures- we can just monitor the fluid. Hold Xarelto for 5 days before the procedure. Usually you can restart it the day after, but ask the doctor performing the procedure.  I will follow up with you over the phone after these results come back.   Please do your part to reduce the spread of COVID-19.

## 2019-03-26 ENCOUNTER — Other Ambulatory Visit (HOSPITAL_COMMUNITY)
Admission: RE | Admit: 2019-03-26 | Discharge: 2019-03-26 | Disposition: A | Discharge status: Home / Self Care | Payer: Medicare Other | Source: Other Acute Inpatient Hospital | Attending: Urology | Admitting: Urology

## 2019-03-26 ENCOUNTER — Ambulatory Visit (INDEPENDENT_AMBULATORY_CARE_PROVIDER_SITE_OTHER): Payer: Medicare Other | Admitting: Urology

## 2019-03-26 ENCOUNTER — Other Ambulatory Visit: Payer: Self-pay

## 2019-03-26 DIAGNOSIS — N35911 Unspecified urethral stricture, male, meatal: Secondary | ICD-10-CM

## 2019-03-26 DIAGNOSIS — N400 Enlarged prostate without lower urinary tract symptoms: Secondary | ICD-10-CM | POA: Diagnosis not present

## 2019-03-26 DIAGNOSIS — N5201 Erectile dysfunction due to arterial insufficiency: Secondary | ICD-10-CM | POA: Diagnosis not present

## 2019-03-26 DIAGNOSIS — Z87440 Personal history of urinary (tract) infections: Secondary | ICD-10-CM | POA: Diagnosis not present

## 2019-03-26 DIAGNOSIS — N390 Urinary tract infection, site not specified: Secondary | ICD-10-CM | POA: Insufficient documentation

## 2019-03-26 LAB — URINALYSIS, COMPLETE (UACMP) WITH MICROSCOPIC
Bilirubin Urine: NEGATIVE
Glucose, UA: NEGATIVE mg/dL
Hgb urine dipstick: NEGATIVE
Ketones, ur: NEGATIVE mg/dL
Leukocytes,Ua: NEGATIVE
Nitrite: POSITIVE — AB
Protein, ur: NEGATIVE mg/dL
Specific Gravity, Urine: 1.013 (ref 1.005–1.030)
pH: 5 (ref 5.0–8.0)

## 2019-03-28 LAB — URINE CULTURE

## 2019-03-29 ENCOUNTER — Ambulatory Visit (HOSPITAL_COMMUNITY)
Admission: RE | Admit: 2019-03-29 | Discharge: 2019-03-29 | Disposition: A | Discharge status: Home / Self Care | Payer: Medicare Other | Source: Ambulatory Visit | Attending: Critical Care Medicine | Admitting: Critical Care Medicine

## 2019-03-29 DIAGNOSIS — Z20828 Contact with and (suspected) exposure to other viral communicable diseases: Secondary | ICD-10-CM | POA: Diagnosis not present

## 2019-03-29 DIAGNOSIS — Z01812 Encounter for preprocedural laboratory examination: Secondary | ICD-10-CM | POA: Diagnosis not present

## 2019-03-29 LAB — SARS CORONAVIRUS 2 (TAT 6-24 HRS): SARS Coronavirus 2: NEGATIVE

## 2019-03-31 ENCOUNTER — Other Ambulatory Visit: Payer: Self-pay | Admitting: Critical Care Medicine

## 2019-03-31 ENCOUNTER — Other Ambulatory Visit: Payer: Self-pay

## 2019-03-31 ENCOUNTER — Ambulatory Visit (HOSPITAL_COMMUNITY)
Admission: RE | Admit: 2019-03-31 | Discharge: 2019-03-31 | Disposition: A | Discharge status: Home / Self Care | Payer: Medicare Other | Source: Ambulatory Visit | Attending: Critical Care Medicine | Admitting: Critical Care Medicine

## 2019-03-31 ENCOUNTER — Encounter (HOSPITAL_COMMUNITY): Payer: Self-pay | Admitting: Radiology

## 2019-03-31 DIAGNOSIS — J90 Pleural effusion, not elsewhere classified: Secondary | ICD-10-CM | POA: Diagnosis not present

## 2019-03-31 DIAGNOSIS — J948 Other specified pleural conditions: Secondary | ICD-10-CM | POA: Diagnosis not present

## 2019-03-31 HISTORY — PX: IR THORACENTESIS ASP PLEURAL SPACE W/IMG GUIDE: IMG5380

## 2019-03-31 LAB — BODY FLUID CELL COUNT WITH DIFFERENTIAL
Eos, Fluid: 0 %
Lymphs, Fluid: 82 %
Monocyte-Macrophage-Serous Fluid: 17 % — ABNORMAL LOW (ref 50–90)
Neutrophil Count, Fluid: 1 % (ref 0–25)
Total Nucleated Cell Count, Fluid: 270 cu mm (ref 0–1000)

## 2019-03-31 LAB — GRAM STAIN

## 2019-03-31 LAB — FUNGUS CULTURE RESULT

## 2019-03-31 LAB — FUNGUS CULTURE WITH STAIN

## 2019-03-31 LAB — PROTEIN, PLEURAL OR PERITONEAL FLUID: Total protein, fluid: 3.9 g/dL

## 2019-03-31 LAB — LACTATE DEHYDROGENASE, PLEURAL OR PERITONEAL FLUID: LD, Fluid: 78 U/L — ABNORMAL HIGH (ref 3–23)

## 2019-03-31 LAB — FUNGAL ORGANISM REFLEX

## 2019-03-31 LAB — GLUCOSE, PLEURAL OR PERITONEAL FLUID: Glucose, Fluid: 107 mg/dL

## 2019-03-31 MED ORDER — LIDOCAINE HCL 1 % IJ SOLN
INTRAMUSCULAR | Status: AC
  Filled 2019-03-31: qty 20

## 2019-03-31 NOTE — Procedures (Signed)
PROCEDURE SUMMARY:  Successful US guided left thoracentesis. Yielded 1.8 L of clear yellow fluid. Pt tolerated procedure well. No immediate complications.  Specimen was sent for labs. CXR ordered.  EBL < 5 mL  Ascencion Dike PA-C 03/31/2019 1:43 PM

## 2019-04-01 LAB — CYTOLOGY - NON PAP

## 2019-04-01 NOTE — Progress Notes (Signed)
Please call Casey [name] to let him know that his pleural fluid cytology was benign. Thanks!

## 2019-04-01 NOTE — Progress Notes (Signed)
Please let Mr. Casey [name] know that so far his pleural fluid studies look similar to his previous, and I will follow up with him after the cytology results are available. Thanks!

## 2019-04-04 ENCOUNTER — Other Ambulatory Visit: Payer: Self-pay | Admitting: Psychiatry

## 2019-04-05 LAB — CULTURE, BODY FLUID W GRAM STAIN -BOTTLE: Culture: NO GROWTH

## 2019-04-13 ENCOUNTER — Telehealth: Payer: Self-pay | Admitting: Psychiatry

## 2019-04-13 ENCOUNTER — Other Ambulatory Visit: Payer: Self-pay

## 2019-04-13 ENCOUNTER — Encounter: Payer: Self-pay | Admitting: Psychiatry

## 2019-04-13 ENCOUNTER — Ambulatory Visit (INDEPENDENT_AMBULATORY_CARE_PROVIDER_SITE_OTHER): Payer: Medicare Other | Admitting: Psychiatry

## 2019-04-13 DIAGNOSIS — F3131 Bipolar disorder, current episode depressed, mild: Secondary | ICD-10-CM

## 2019-04-13 DIAGNOSIS — F411 Generalized anxiety disorder: Secondary | ICD-10-CM | POA: Diagnosis not present

## 2019-04-13 DIAGNOSIS — G251 Drug-induced tremor: Secondary | ICD-10-CM

## 2019-04-13 DIAGNOSIS — R410 Disorientation, unspecified: Secondary | ICD-10-CM

## 2019-04-13 DIAGNOSIS — F05 Delirium due to known physiological condition: Secondary | ICD-10-CM

## 2019-04-13 MED ORDER — BUPROPION HCL ER (XL) 150 MG PO TB24: 150.0000 mg | ORAL_TABLET | Freq: Every day | ORAL | 1 refills | Status: DC

## 2019-04-13 MED ORDER — OXCARBAZEPINE 600 MG PO TABS: 1200.0000 mg | ORAL_TABLET | Freq: Every day | ORAL | 1 refills | Status: DC

## 2019-04-13 NOTE — Telephone Encounter (Signed)
Refills submitted per request 

## 2019-04-13 NOTE — Telephone Encounter (Signed)
Patient forgot to mention he needs refills on Trileptal 600 mg, and Wellbutrin to be sent to [facility] in [place]

## 2019-04-13 NOTE — Progress Notes (Signed)
Casey [name] [id number] Oct 12, 1945 73 y.o.    Subjective:   Patient ID:  Casey [name] is a 73 y.o. (DOB 02/18/1946) male.  Chief Complaint:  Chief Complaint  Patient presents with  . Follow-up    Recent confusion, mood and anxiety    HPI seen with wife Casey [name] presents for follow-up of bipolar disorder and anxiety and his wife complains he is recently more confused.  This was an urgent appointment because of his confusion.  At visit  Dec 02, 2018.  He was having confusion and benztropine was stopped to see if that was the cause.  Also it was having balance issues and therefore Trileptal was switched all to nighttime 1200 mg nightly. Confusion much better per both of them with DC benztropine.  Balance better with change Trileptal.   Last visit was January 13, 2019 and he was doing well as noted above and he was encouraged not to make further med changes at that time.  Health problems with pulmonary effusion and fluid removed twice related to fall in January.    Some anxiety over Covid and social unrest.  Not generally depressed.  Doing more projects outside and otherwise.  Rare Xanax.    Wife Casey [name] thinks he's doing good except a little more energy and strength.  Seems out of the depression.  But for the last 2 years he's done nothing and laid around all the time.  He's a lot more active now. Big improvement. Went to church first time in 2 years.  Patient reports stable mood and denies depressed or irritable moods.  Patient denies any recent difficulty with anxiety, it's better. Gets out more now.  Patient denies difficulty with sleep initiation or maintenance, 8 hours. Denies appetite disturbance.  Patient reports that energy and motivation have been good.  Patient has difficulty with concentration and memory.  Patient denies any suicidal ideation.  Past Psychiatric Medication Trials: Seroquel 300, olanzapine, perphenazine, risperidone, lithium, Trileptal, lamotrigine  caused nausea, Wellbutrin, alprazolam, Depakote, Trileptal, carbamazepine, citalopram, Abilify, sertraline 150, Latuda   Benztropine confusion                                                           Review of Systems:  Review of Systems  Eyes: Positive for visual disturbance.  Skin: Positive for rash.       chronic  Neurological: Negative for tremors and weakness.          Psychiatric/Behavioral: Negative for agitation, behavioral problems, confusion, decreased concentration, dysphoric mood, hallucinations, self-injury, sleep disturbance and suicidal ideas. The patient is not nervous/anxious and is not hyperactive.     Medications: I have reviewed the patient's current medications.  Current Outpatient Medications  Medication Sig Dispense Refill  . acetaminophen (TYLENOL) 500 MG tablet Take 1,000 mg by mouth every 6 (six) hours as needed for headache.    . Albuterol Sulfate 108 (90 Base) MCG/ACT AEPB Inhale 1-2 puffs into the lungs as needed.     . bisacodyl (DULCOLAX) 5 MG EC tablet Take 5-15 mg by mouth daily as needed for moderate constipation.    Marland Kitchen buPROPion (WELLBUTRIN XL) 150 MG 24 hr tablet Take 1 tablet (150 mg total) by mouth daily. 90 tablet 1  . cholecalciferol (VITAMIN D) 1000 UNITS tablet Take 2,000  Units by mouth daily.    . colchicine 0.6 MG tablet     . fluticasone (FLONASE) 50 MCG/ACT nasal spray Place 2 sprays into both nostrils daily.  11  . Ginkgo Biloba 40 MG TABS Take 60 mg by mouth 2 (two) times daily.    Marland Kitchen levothyroxine (SYNTHROID, LEVOTHROID) 25 MCG tablet Take 25 mcg by mouth daily before breakfast.     . linaclotide (LINZESS) 72 MCG capsule Take 1 capsule (72 mcg total) by mouth daily before breakfast. 30 capsule 2  . ondansetron (ZOFRAN-ODT) 4 MG disintegrating tablet Take 1 tablet by mouth 3 (three) times daily as needed.  2  . oxcarbazepine (TRILEPTAL) 600 MG tablet Take 2 tablets (1,200 mg total) by mouth at bedtime. 60 tablet 1  . pantoprazole (PROTONIX)  40 MG tablet TAKE ONE TABLET BY MOUTH ONCE DAILY 30 tablet 11  . rivaroxaban (XARELTO) 20 MG TABS tablet Take 20 mg by mouth daily after supper.     . SUPER B COMPLEX/C PO Take by mouth.    . Wheat Dextrin (BENEFIBER PO) Take 1 Dose by mouth as needed (constapation).     . ALPRAZolam (XANAX) 0.5 MG tablet Take 1 tablet (0.5 mg total) by mouth 2 (two) times daily as needed for anxiety. (Patient not taking: Reported on 04/13/2019) 30 tablet 1   No current facility-administered medications for this visit.     Medication Side Effects: resolved  Allergies:  Allergies  Allergen Reactions  . Amlodipine Other (See Comments)    Stopped due to kidney issues  . Vicodin [Hydrocodone-Acetaminophen] Other (See Comments)    Patient is unsure of reaction    Past Medical History:  Diagnosis Date  . Arthritis   . Atrial fibrillation ([place]) 01/12/2010   2D Echo EF=>55%  . Bipolar disorder ([place])   . Chronic back pain   . Depression   . Dysrhythmia    AFib  . History of colonic polyps   . History of gout   . Hyperlipidemia   . Hypertension   . Hypothyroidism   . Morbid obesity ([place])   . OSA (obstructive sleep apnea)    AHI was 27.62hr RDI was 34.3 hr REM 0.00hr; had CPAP years ago but no longer uses.  . Prostatitis   . Tubular adenoma of colon 01/2016    Family History  Problem Relation Age of Onset  . Diabetes Mother   . Heart failure Mother   . Hypertension Mother   . Hypertension Father   . Cancer Paternal Grandmother   . Stroke Sister   . Liver disease Brother   . Vascular Disease Brother   . Arthritis Brother   . Colon cancer Neg Hx     Social History   Socioeconomic History  . Marital status: Married    Spouse name: Not on file  . Number of children: Not on file  . Years of education: Not on file  . Highest education level: Not on file  Occupational History  . Not on file  Social Needs  . Financial resource strain: Not on file  . Food insecurity    Worry: Not on  file    Inability: Not on file  . Transportation needs    Medical: Not on file    Non-medical: Not on file  Tobacco Use  . Smoking status: Never Smoker  . Smokeless tobacco: Never Used  Substance and Sexual Activity  . Alcohol use: No    Alcohol/week: 0.0 standard drinks  . Drug  use: No  . Sexual activity: Yes    Birth control/protection: None  Lifestyle  . Physical activity    Days per week: Not on file    Minutes per session: Not on file  . Stress: Not on file  Relationships  . Social Herbalist on phone: Not on file    Gets together: Not on file    Attends religious service: Not on file    Active member of club or organization: Not on file    Attends meetings of clubs or organizations: Not on file    Relationship status: Not on file  . Intimate partner violence    Fear of current or ex partner: Not on file    Emotionally abused: Not on file    Physically abused: Not on file    Forced sexual activity: Not on file  Other Topics Concern  . Not on file  Social History Narrative  . Not on file    Past Medical History, Surgical history, Social history, and Family history were reviewed and updated as appropriate.   Please see review of systems for further details on the patient's review from today.   Objective:   Physical Exam:  There were no vitals taken for this visit.  Physical Exam Constitutional:      General: He is not in acute distress.    Appearance: He is well-developed.  Musculoskeletal:        General: No deformity.  Neurological:     Mental Status: He is alert and oriented to person, place, and time.     Coordination: Coordination normal.  Psychiatric:        Attention and Perception: He is attentive. He does not perceive auditory hallucinations.        Mood and Affect: Mood is anxious. Mood is not depressed. Affect is not labile, blunt, angry or inappropriate.        Speech: Speech normal.        Behavior: Behavior normal.        Thought  Content: Thought content normal. Thought content does not include homicidal or suicidal ideation. Thought content does not include homicidal or suicidal plan.        Cognition and Memory: Cognition normal.        Judgment: Judgment normal.     Comments: Insight intact. No auditory or visual hallucinations. No delusions.  Better cogninition and mood.     Lab Review:     Component Value Date/Time   NA 141 12/28/2018 0941   K 4.0 12/28/2018 0941   CL 104 12/28/2018 0941   CO2 22 12/28/2018 0941   GLUCOSE 88 12/28/2018 0941   GLUCOSE 111 (H) 06/30/2018 1311   BUN 16 12/28/2018 0941   CREATININE 1.30 (H) 12/28/2018 0941   CALCIUM 9.4 12/28/2018 0941   PROT 6.6 12/28/2018 0941   ALBUMIN 4.5 12/28/2018 0941   AST 21 12/28/2018 0941   ALT 22 12/28/2018 0941   ALKPHOS 115 12/28/2018 0941   BILITOT 0.7 12/28/2018 0941   GFRNONAA 55 (L) 12/28/2018 0941   GFRAA 63 12/28/2018 0941       Component Value Date/Time   WBC 8.1 12/28/2018 0941   WBC 5.4 06/30/2018 1311   RBC 4.81 12/28/2018 0941   RBC 4.52 06/30/2018 1311   HGB 15.2 12/28/2018 0941   HCT 42.9 12/28/2018 0941   PLT 186 12/28/2018 0941   MCV 89 12/28/2018 0941   MCH 31.6 12/28/2018 0941   MCH  31.9 06/30/2018 1311   MCHC 35.4 12/28/2018 0941   MCHC 32.8 06/30/2018 1311   RDW 12.9 12/28/2018 0941   LYMPHSABS 1.1 12/28/2018 0941   MONOABS 0.5 06/30/2018 1311   EOSABS 0.1 12/28/2018 0941   BASOSABS 0.0 12/28/2018 0941    No results found for: POCLITH, LITHIUM   No results found for: PHENYTOIN, PHENOBARB, VALPROATE, CBMZ   .res Assessment: Plan:    Alijha was seen today for follow-up.  Diagnoses and all orders for this visit:  Bipolar affective disorder, currently depressed, mild (HCC)  Generalized anxiety disorder  Tremor due to multiple drugs  Subacute confusional state    Balance problem much improved with dosing Trileptal all in the evening.  Depression under good control and in fact the best he has  been in years per his wife.  No mood swings.  Has been's stable for several months now.  Anxiety is managed.  Tremor is manageable.  Discussed the that excess caffeine with Wellbutrin could exacerbate it.  Continue Wellbutrin XL 150 mg bc may be helping energy and motivation and too early to stop it now.  May consider stopping in the spring.  He is making progress and we want to have him continue to be physically active and mentally stimulated.  Emphasize this to both he and his wife.  He needs to continue exercising and specifically walking and if he does these things we may be able to discontinue the Wellbutrin in the future.  It is too risky to do that now.  Because he is only recently started making more progress.  Minimize Xanax because of the possible side effect of confusion.  At this time he is taking it rarely.  We discussed the short-term risks associated with benzodiazepines including sedation and increased fall risk among others.  Discussed long-term side effect risk including dependence, potential withdrawal symptoms, and the potential eventual dose-related risk of dementia.  Discussed side effect of each medication  Follow-up 4 months  This was a 30-minute appointment  Lynder Parents, MD, DFAPA  Please see After Visit Summary for patient specific instructions.  Future Appointments  Date Time Provider [place]  06/09/2019  9:00 AM Croitoru, Dani Gobble, MD [facility]    No orders of the defined types were placed in this encounter.     -------------------------------

## 2019-04-13 NOTE — Telephone Encounter (Signed)
error 

## 2019-04-14 DIAGNOSIS — E039 Hypothyroidism, unspecified: Secondary | ICD-10-CM | POA: Diagnosis not present

## 2019-04-14 DIAGNOSIS — I4891 Unspecified atrial fibrillation: Secondary | ICD-10-CM | POA: Diagnosis not present

## 2019-04-14 DIAGNOSIS — N183 Chronic kidney disease, stage 3 (moderate): Secondary | ICD-10-CM | POA: Diagnosis not present

## 2019-04-14 DIAGNOSIS — I10 Essential (primary) hypertension: Secondary | ICD-10-CM | POA: Diagnosis not present

## 2019-04-14 LAB — ACID FAST CULTURE WITH REFLEXED SENSITIVITIES (MYCOBACTERIA): Acid Fast Culture: NEGATIVE

## 2019-04-23 ENCOUNTER — Ambulatory Visit
Admission: RE | Admit: 2019-04-23 | Discharge: 2019-04-23 | Disposition: A | Discharge status: Home / Self Care | Payer: Self-pay | Source: Ambulatory Visit | Attending: Critical Care Medicine | Admitting: Critical Care Medicine

## 2019-04-23 ENCOUNTER — Other Ambulatory Visit: Payer: Self-pay

## 2019-04-23 DIAGNOSIS — J90 Pleural effusion, not elsewhere classified: Secondary | ICD-10-CM

## 2019-04-23 NOTE — Progress Notes (Signed)
Order placed for C to be uploaded into pacs. Nothing further needed at this time.

## 2019-05-21 ENCOUNTER — Telehealth: Payer: Self-pay | Admitting: Internal Medicine

## 2019-05-21 NOTE — Telephone Encounter (Signed)
Pt's wife called to make OV for patient. She said he wasn't running a fever, but he was having nausea and vomiting. He has OV with [place] on Tuesday at 2pm. Please advise. [phone]

## 2019-05-24 NOTE — Telephone Encounter (Signed)
Spoke with spouse. Pt is feel better and they canceled apt earlier this morning prior to me calling them.

## 2019-05-25 ENCOUNTER — Ambulatory Visit: Payer: Medicare Other | Admitting: Gastroenterology

## 2019-06-09 ENCOUNTER — Encounter: Payer: Self-pay | Admitting: Cardiovascular Disease

## 2019-06-09 ENCOUNTER — Telehealth (INDEPENDENT_AMBULATORY_CARE_PROVIDER_SITE_OTHER): Payer: Medicare Other | Admitting: Cardiovascular Disease

## 2019-06-09 DIAGNOSIS — E669 Obesity, unspecified: Secondary | ICD-10-CM

## 2019-06-09 DIAGNOSIS — Z7901 Long term (current) use of anticoagulants: Secondary | ICD-10-CM | POA: Diagnosis not present

## 2019-06-09 DIAGNOSIS — I4821 Permanent atrial fibrillation: Secondary | ICD-10-CM | POA: Diagnosis not present

## 2019-06-09 DIAGNOSIS — G4733 Obstructive sleep apnea (adult) (pediatric): Secondary | ICD-10-CM

## 2019-06-09 DIAGNOSIS — E039 Hypothyroidism, unspecified: Secondary | ICD-10-CM | POA: Diagnosis not present

## 2019-06-09 DIAGNOSIS — F3175 Bipolar disorder, in partial remission, most recent episode depressed: Secondary | ICD-10-CM

## 2019-06-09 DIAGNOSIS — E785 Hyperlipidemia, unspecified: Secondary | ICD-10-CM

## 2019-06-09 IMAGING — CT CT HEAD W/O CM
4 series · 16 of 47 positions shown, 18 images · non-contrast
Comparison: CT scan of November 04, 2016.

CLINICAL DATA: Headache for months.

EXAM:
CT HEAD WITHOUT CONTRAST
TECHNIQUE: Contiguous axial images were obtained from the base of the skull
through the vertex without intravenous contrast.

[Series 3: head without · axial · non-contrast · 0.45mm/px · z∈[-157,-27]mm · 7 of 36 slices shown, 9 images]
[im 5/36  brain]
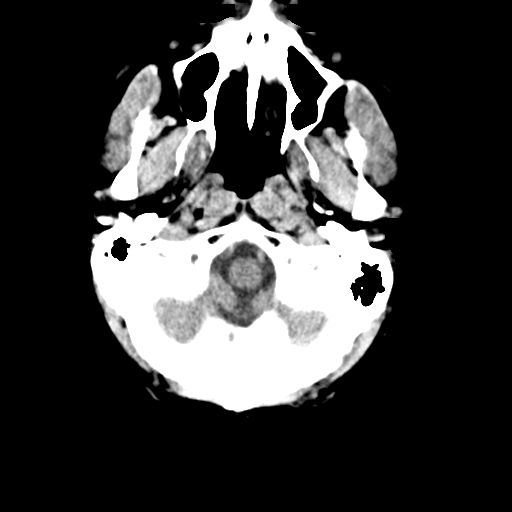
[im 5/36  bone]
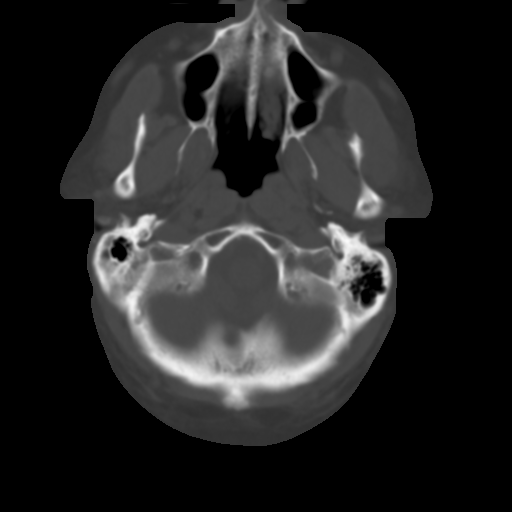
[im 9/36  brain]
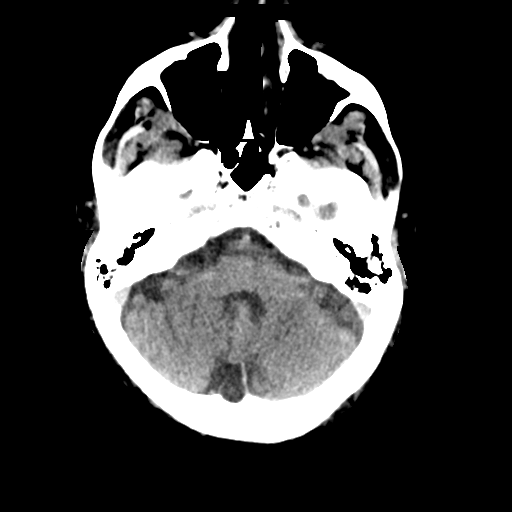
[im 14/36  brain]
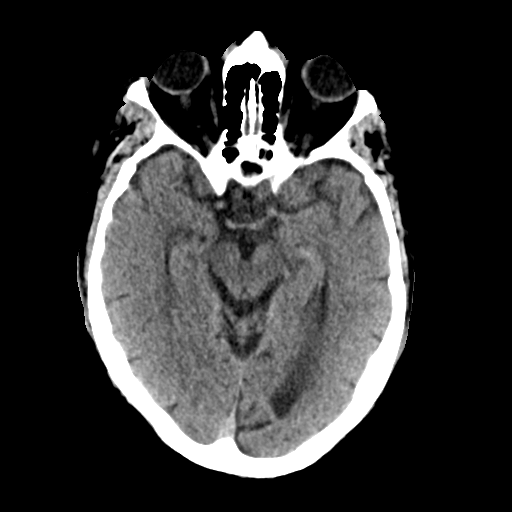
[im 18/36  brain]
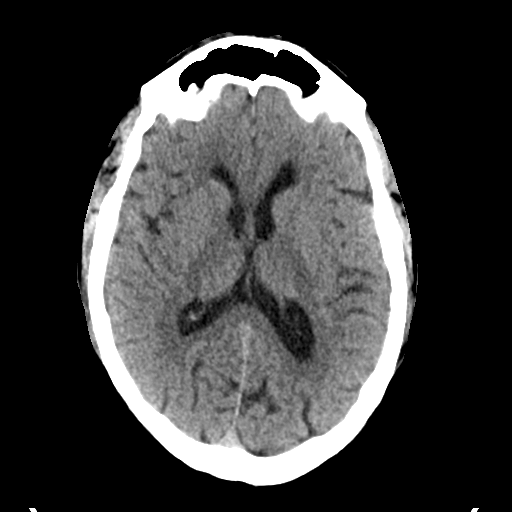
[im 22/36  brain]
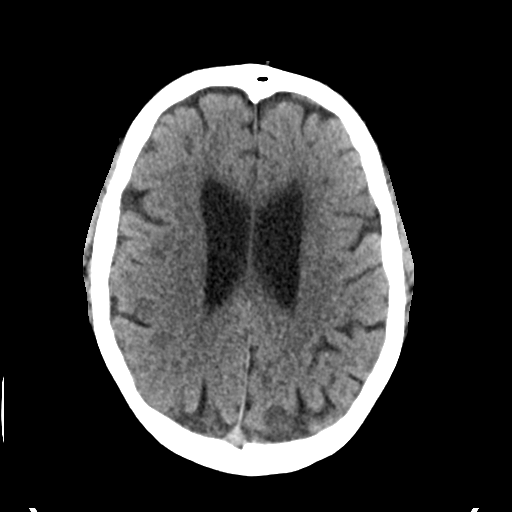
[im 22/36  bone]
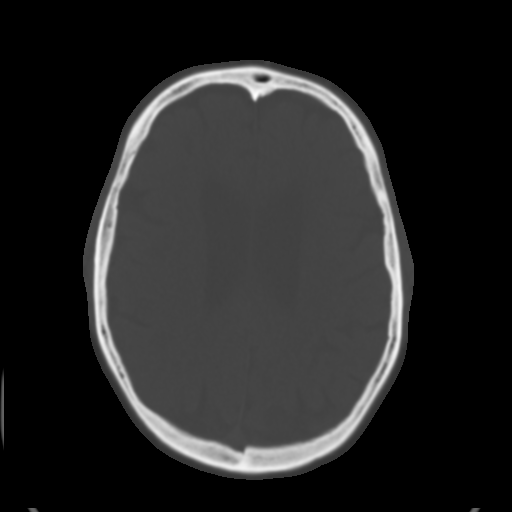
[im 27/36  brain]
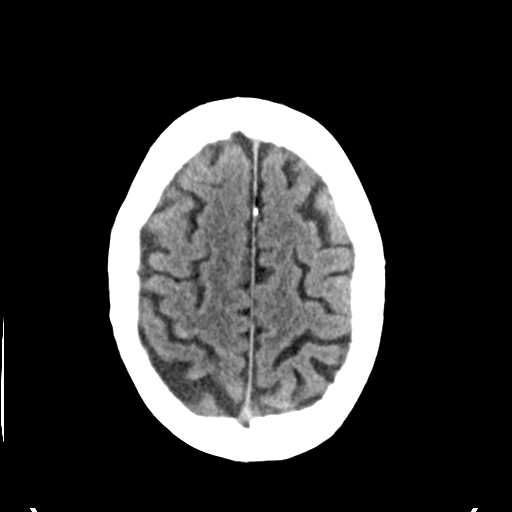
[im 31/36  brain]
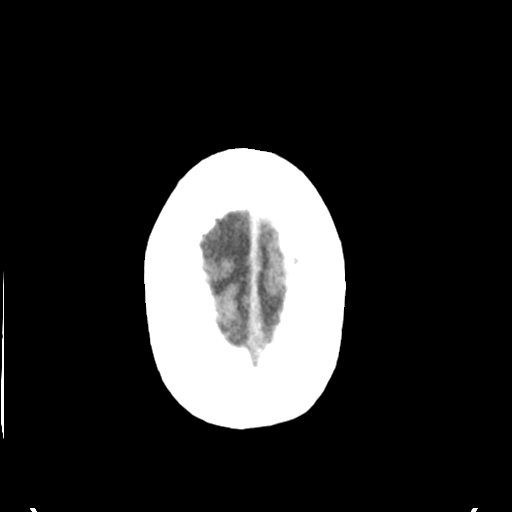

[Series 4: head bone · axial · 0.45mm/px · z∈[-161,-125]mm · 3 of 90 slices shown]
[im 9/90  bone]
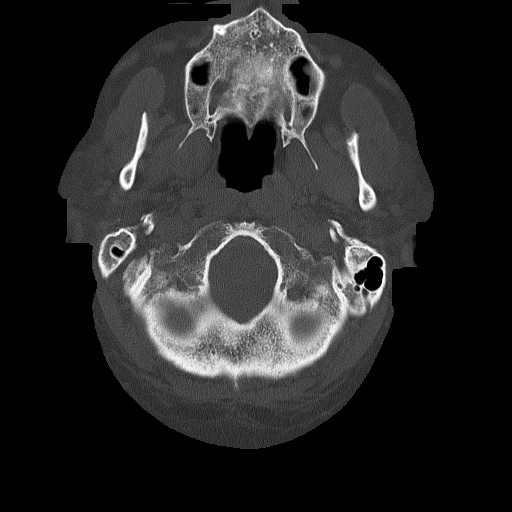
[im 18/90  bone]
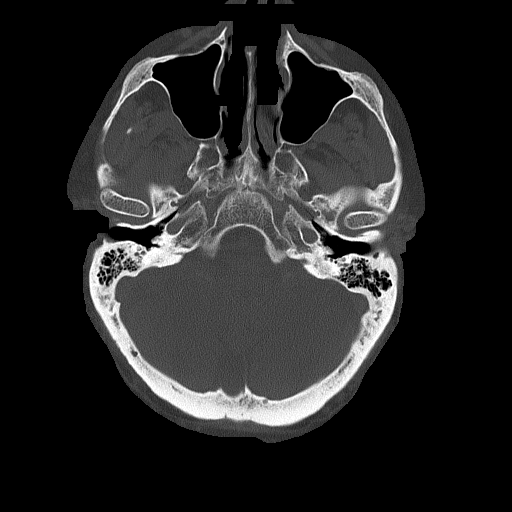
[im 27/90  bone]
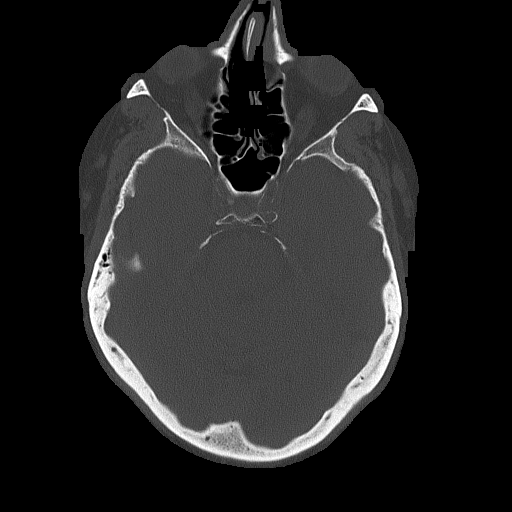

[Series 5: head without cor · coronal · non-contrast · 0.30mm/px · 3 of 73 slices shown]
[im 25/73  brain]
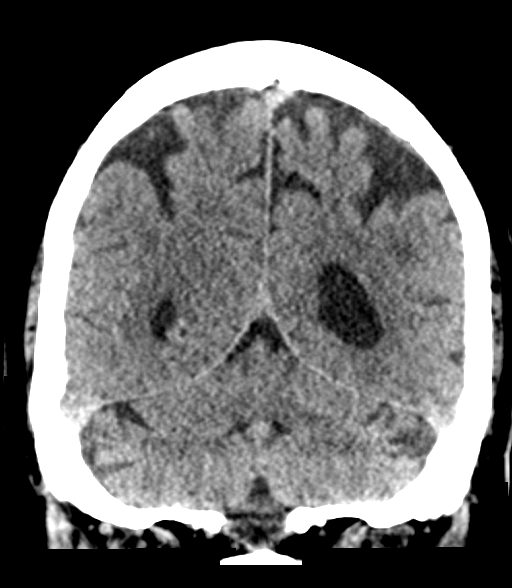
[im 33/73  brain]
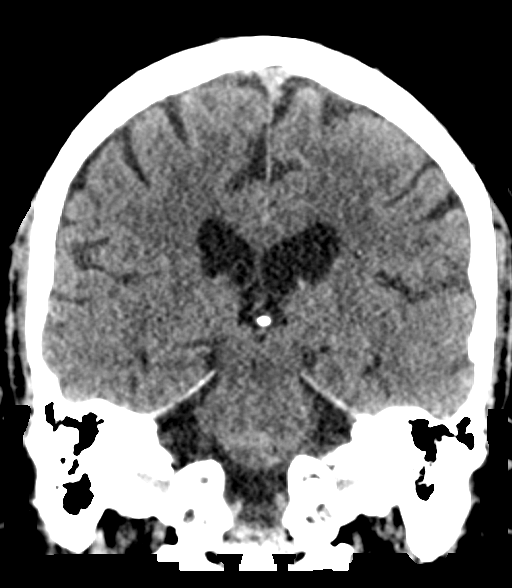
[im 41/73  brain]
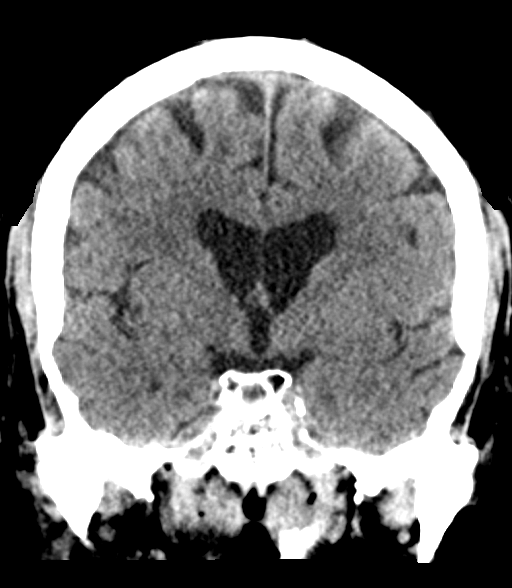

[Series 6: head without sag · sagittal · non-contrast · 0.35mm/px · 3 of 54 slices shown]
[im 18/54  brain]
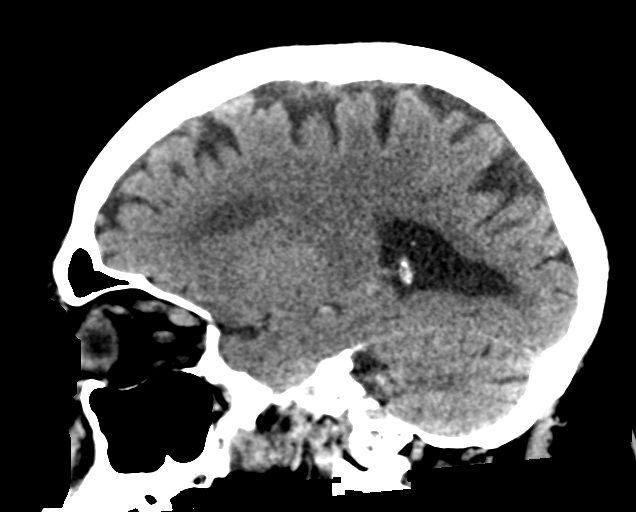
[im 27/54  brain]
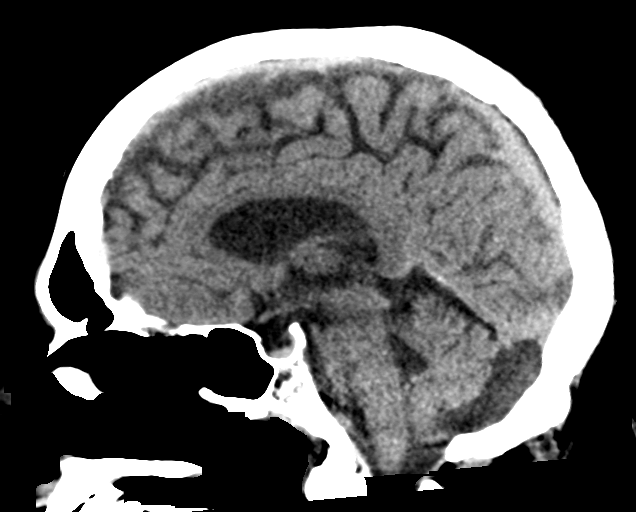
[im 36/54  brain]
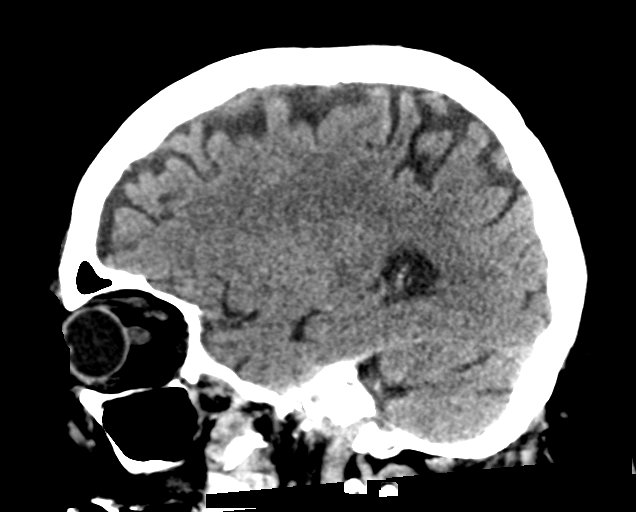

[16 of 47 positions shown; findings below may reference images not displayed]

FINDINGS: Brain: Mild chronic ischemic white matter disease is noted. Minimal
diffuse cortical atrophy is noted. No mass effect or midline shift
is noted. Ventricular size is within normal limits. There is no
evidence of mass lesion, hemorrhage or acute infarction.

Vascular: Atherosclerosis of carotid siphons is noted.

Skull: Normal. Negative for fracture or focal lesion.

Sinuses/Orbits: No acute finding.

Other: None.
IMPRESSION: Mild chronic ischemic white matter disease. Minimal diffuse cortical
atrophy. No acute intracranial abnormality seen.

## 2019-06-09 NOTE — Progress Notes (Signed)
Virtual Visit via Video Note   This visit type was conducted due to national recommendations for restrictions regarding the COVID-19 Pandemic (e.g. social distancing) in an effort to limit this patient's exposure and mitigate transmission in our community.  Due to his co-morbid illnesses, this patient is at least at moderate risk for complications without adequate follow up.  This format is felt to be most appropriate for this patient at this time.  All issues noted in this document were discussed and addressed.  A limited physical exam was performed with this format.  Please refer to the patient's chart for his consent to telehealth for [facility].   Date:  06/09/2019   ID:  Casey [name], DOB Aug 27, 1945, MRN [id number]  Patient Location: Home Provider Location: Office  PCP:  Sharilyn Sites, MD  Cardiologist:  Nasir Bright Electrophysiologist:  None   Evaluation Performed:  Follow-Up Visit  Chief Complaint:  AFib  History of Present Illness:    Casey [name] is a 73 y.o. male with permanent atrial fibrillation, but without other major structural cardiac abnormalities.  He has obstructive sleep apnea and longstanding struggles with bipolar disorder.  About a year ago he was having a lot of issues with depression.  He had some medication changes never associated with poor balance drowsiness and multiple falls.  He had 3 left rib fractures and at one point had a severe head injury that required staples.  Thankfully he did not have any intracranial bleeding on CT.  His psychiatric medications were adjusted and he quickly improved.  He has not had any recent falls or injuries.  He is still battling depression.  Several months later he began having problems with kidney stones and had an incidental discovery of a large left pleural effusion.  The fluid was exudative.  It was presumed to be secondary to the previous trauma.  He felt better after thoracentesis, which was performed twice.  He  feels tired all the time.  Although he was diagnosed with sleep apnea, he has not used CPAP in years.  After losing weight, he felt that this was cured.  He was prescribed CPAP twice before and on each occasion only was able to use it for couple of months before abandoning therapy. He has not been evaluated for a dental device.  He is missing a few teeth.  The patient specifically denies any chest pain at rest exertion, dyspnea at rest or with exertion, orthopnea, paroxysmal nocturnal dyspnea, syncope, palpitations, focal neurological deficits, intermittent claudication, lower extremity edema, unexplained weight gain, cough, hemoptysis or wheezing.   The patient does not have symptoms concerning for COVID-19 infection (fever, chills, cough, or new shortness of breath).    Past Medical History:  Diagnosis Date  . Arthritis   . Atrial fibrillation ([place]) 01/12/2010   2D Echo EF=>55%  . Bipolar disorder ([place])   . Chronic back pain   . Depression   . Dysrhythmia    AFib  . History of colonic polyps   . History of gout   . Hyperlipidemia   . Hypertension   . Hypothyroidism   . Morbid obesity ([place])   . OSA (obstructive sleep apnea)    AHI was 27.62hr RDI was 34.3 hr REM 0.00hr; had CPAP years ago but no longer uses.  . Prostatitis   . Tubular adenoma of colon 01/2016   Past Surgical History:  Procedure Laterality Date  . APPENDECTOMY  1959  . asthma    . CATARACT EXTRACTION  Bilateral 1995  . COLONOSCOPY  2011   RMR: left-sided diverticula, minimal rectal friability. No polyps. Repeat 5 years.  . COLONOSCOPY WITH PROPOFOL N/A 01/15/2016   Dr.Rourk- diverticulosis in the entire examined colon, multiple rectal and colonic polyps, internal hemorrhoids. bx= tubular adenomas and hyperplastic polyps.   Marland Kitchen HAND / FINGER LESION EXCISION Right   . IR THORACENTESIS ASP PLEURAL SPACE W/IMG GUIDE  03/01/2019  . IR THORACENTESIS ASP PLEURAL SPACE W/IMG GUIDE  03/31/2019  . POLYPECTOMY  01/15/2016    Procedure: POLYPECTOMY;  Surgeon: Daneil Dolin, MD;  Location: [facility];  Service: Endoscopy;;  . right shoulder surgery    . TONSILLECTOMY  1955     Current Meds  Medication Sig  . acetaminophen (TYLENOL) 500 MG tablet Take 1,000 mg by mouth every 6 (six) hours as needed for headache.  . Albuterol Sulfate 108 (90 Base) MCG/ACT AEPB Inhale 1-2 puffs into the lungs as needed.   . bisacodyl (DULCOLAX) 5 MG EC tablet Take 5-15 mg by mouth daily as needed for moderate constipation.  Marland Kitchen buPROPion (WELLBUTRIN XL) 150 MG 24 hr tablet Take 1 tablet (150 mg total) by mouth daily.  . cholecalciferol (VITAMIN D) 1000 UNITS tablet Take 2,000 Units by mouth daily.  . Ginkgo Biloba 40 MG TABS Take 60 mg by mouth 2 (two) times daily.  Marland Kitchen levothyroxine (SYNTHROID, LEVOTHROID) 25 MCG tablet Take 25 mcg by mouth daily before breakfast.   . oxcarbazepine (TRILEPTAL) 600 MG tablet Take 2 tablets (1,200 mg total) by mouth at bedtime.  . pantoprazole (PROTONIX) 40 MG tablet TAKE ONE TABLET BY MOUTH ONCE DAILY  . rivaroxaban (XARELTO) 20 MG TABS tablet Take 20 mg by mouth daily after supper.   . SUPER B COMPLEX/C PO Take by mouth.     Allergies:   Amlodipine and Vicodin [hydrocodone-acetaminophen]   Social History   Tobacco Use  . Smoking status: Never Smoker  . Smokeless tobacco: Never Used  Substance Use Topics  . Alcohol use: No    Alcohol/week: 0.0 standard drinks  . Drug use: No     Family Hx: The patient's family history includes Arthritis in his brother; Cancer in his paternal grandmother; Diabetes in his mother; Heart failure in his mother; Hypertension in his father and mother; Liver disease in his brother; Stroke in his sister; Vascular Disease in his brother. There is no history of Colon cancer.  ROS:   Please see the history of present illness.     All other systems reviewed and are negative.   Prior CV studies:   The following studies were reviewed today:  CT of the chest, CT  of the head, records of recent hospitalization and pleurocentesis results  Labs/Other Tests and Data Reviewed:    EKG:  An ECG dated July 31, 2018 was personally reviewed today and demonstrated:  Atrial fibrillation, otherwise normal  Recent Labs: 12/28/2018: ALT 22; BUN 16; Creatinine, Ser 1.30; Hemoglobin 15.2; Platelets 186; Potassium 4.0; Sodium 141; TSH 1.610   Recent Lipid Panel No results found for: CHOL, TRIG, HDL, CHOLHDL, LDLCALC, LDLDIRECT  01/28/2019 Total cholesterol 183, HDL 60, LDL 111, triglycerides 58  Wt Readings from Last 3 Encounters:  06/09/19 205 lb (93 kg)  03/24/19 210 lb 9.6 oz (95.5 kg)  02/23/19 210 lb 9.6 oz (95.5 kg)     Objective:    Vital Signs:  BP 140/88   Pulse 89   Ht 5\' 11"  (1.803 m)   Wt 205 lb (93  kg)   SpO2 96%   BMI 28.59 kg/m    VITAL SIGNS:  reviewed GEN:  no acute distress EYES:  sclerae anicteric, EOMI - Extraocular Movements Intact RESPIRATORY:  normal respiratory effort, symmetric expansion CARDIOVASCULAR:  no peripheral edema SKIN:  no rash, lesions or ulcers. MUSCULOSKELETAL:  no obvious deformities. NEURO:  alert and oriented x 3, no obvious focal deficit PSYCH:  normal affect  ASSESSMENT & PLAN:    1. AFib: I think this is a permanent arrhythmia and is unlikely even aggressive treatment with ablation or antiarrhythmics will be successful in returning him to sinus rhythm. Spontaneously rate controlled.  No problems with bradycardia or rapid ventricular rates.  This has been present for years and I doubt that it has anything to do with his fatigue symptoms.  Appropriately anticoagulated. 2. Xarelto: Thankfully without serious bleeding complications when he had his falls.  Suspect that his pleural effusion was posttraumatic. 3. OSA: Although depression is clearly part of the issue, I suspect that untreated sleep apnea could have a lot to do with his complaints of fatigue.  Asked him to check with his dentist to see if he has  sufficient support for possible dental device to treat his sleep apnea.  We also discussed the option to perform an in-house sleep study.  The patient and his wife agree that he is unlikely to try CPAP again, even if it is confirmed that he still has sleep apnea. 4. Bipolar disorder: Not quite yet back to his usual jolly self.  5. Obesity: He typically loses a lot of weight when he is going through depression and becomes mildly obese when he returns normal mood. 6. Hypercholesterolemia: Current LDL cholesterol is acceptable in the setting of no known problems of CAD or PAD.  COVID-19 Education: The signs and symptoms of COVID-19 were discussed with the patient and how to seek care for testing (follow up with PCP or arrange E-visit).  The importance of social distancing was discussed today.  Time:   Today, I have spent 26 minutes with the patient with telehealth technology discussing the above problems.     Medication Adjustments/Labs and Tests Ordered: Current medicines are reviewed at length with the patient today.  Concerns regarding medicines are outlined above.   Tests Ordered: No orders of the defined types were placed in this encounter.   Medication Changes: No orders of the defined types were placed in this encounter.   Follow Up:  In Person 12 months  Signed, Sanda Klein, MD  06/09/2019 9:11 AM    [place]

## 2019-06-09 NOTE — Patient Instructions (Signed)

## 2019-08-15 DIAGNOSIS — N183 Chronic kidney disease, stage 3 unspecified: Secondary | ICD-10-CM | POA: Diagnosis not present

## 2019-08-15 DIAGNOSIS — F3162 Bipolar disorder, current episode mixed, moderate: Secondary | ICD-10-CM | POA: Diagnosis not present

## 2019-08-15 DIAGNOSIS — I129 Hypertensive chronic kidney disease with stage 1 through stage 4 chronic kidney disease, or unspecified chronic kidney disease: Secondary | ICD-10-CM | POA: Diagnosis not present

## 2019-08-15 DIAGNOSIS — E039 Hypothyroidism, unspecified: Secondary | ICD-10-CM | POA: Diagnosis not present

## 2019-09-13 ENCOUNTER — Encounter: Payer: Self-pay | Admitting: Psychiatry

## 2019-09-13 ENCOUNTER — Ambulatory Visit (INDEPENDENT_AMBULATORY_CARE_PROVIDER_SITE_OTHER): Payer: Medicare Other | Admitting: Psychiatry

## 2019-09-13 ENCOUNTER — Other Ambulatory Visit: Payer: Self-pay

## 2019-09-13 DIAGNOSIS — G251 Drug-induced tremor: Secondary | ICD-10-CM

## 2019-09-13 DIAGNOSIS — F3131 Bipolar disorder, current episode depressed, mild: Secondary | ICD-10-CM | POA: Diagnosis not present

## 2019-09-13 DIAGNOSIS — F411 Generalized anxiety disorder: Secondary | ICD-10-CM

## 2019-09-13 MED ORDER — OXCARBAZEPINE 600 MG PO TABS: 1200.0000 mg | ORAL_TABLET | Freq: Every day | ORAL | 1 refills | Status: DC

## 2019-09-13 MED ORDER — BUPROPION HCL ER (XL) 300 MG PO TB24: 300.0000 mg | ORAL_TABLET | Freq: Every day | ORAL | 0 refills | Status: DC

## 2019-09-13 NOTE — Progress Notes (Signed)
Casey [name] [id number] 1946/07/07 74 y.o.    Subjective:   Patient ID:  Casey [name] is a 74 y.o. (DOB 31-May-1946) male.  Chief Complaint:  Chief Complaint  Patient presents with  . Follow-up    Med management depression and anxiety  . Sleeping Problem  . Anxiety  . Depression    HPI seen with wife Casey [name] presents for follow-up of bipolar disorder and anxiety and history of confusion  At visit  Dec 02, 2018.  He was having confusion and benztropine was stopped to see if that was the cause.  Also it was having balance issues and therefore Trileptal was switched all to nighttime 1200 mg nightly. Confusion much better per both of them with DC benztropine.  Balance better with change Trileptal.   Last visit was January 13, 2019 and he was doing well as noted above and he was encouraged not to make further med changes at that time.  Last visit April 13, 2019.  He was doing better with regard to depression than he had in quite some time on the Wellbutrin and no meds were changed.  He was having health problems with pulmonary effusion and fluid removed twice related to fall in January 2020.   No recent Xanax but still has anxiety.  Worry over news issues.  Wife says he's still not himself.  Doesn't show normal emotions and wants to socially isolate.  He says it makes him fidgety.  Used to like to cook but doesn't seem as interested as in the past.   :has varied Trileptal times.  It makes him sleepy.  Sleep well generally.  No other SE.    Some anxiety over Covid and social unrest.  Not generally depressed.  Doing more projects outside and otherwise.  Rare Xanax.    Wife [name] thinks he's doing good except a little more energy and strength.  Seems out of the depression.  But for the last 2 years he's done nothing and laid around all the time.  He's a lot more active now. Big improvement. Went to church first time in 2 years.  Patient reports stable mood and denies  depressed or irritable moods.  Patient denies any recent difficulty with anxiety, it's better. Gets out more now.  Patient denies difficulty with sleep initiation or maintenance, 8 hours. Denies appetite disturbance.  Patient reports that energy and motivation have been good.  Patient has difficulty with concentration and memory.  Patient denies any suicidal ideation.  Past Psychiatric Medication Trials: Seroquel 300, olanzapine, perphenazine, risperidone,  lithium,  Latuda,  Abilify,  alprazolam,  Depakote, Trileptal, carbamazepine, lamotrigine caused nausea, citalopram, sertraline 150,   Wellbutrin,  Benztropine confusion                                                           Review of Systems:  Review of Systems  Eyes: Positive for visual disturbance.  Skin: Positive for rash.       chronic  Neurological: Negative for tremors and weakness.          Psychiatric/Behavioral: Negative for agitation, behavioral problems, confusion, decreased concentration, dysphoric mood, hallucinations, self-injury, sleep disturbance and suicidal ideas. The patient is not nervous/anxious and is not hyperactive.     Medications: I have reviewed the patient's  current medications.  Current Outpatient Medications  Medication Sig Dispense Refill  . acetaminophen (TYLENOL) 500 MG tablet Take 1,000 mg by mouth every 6 (six) hours as needed for headache.    . Albuterol Sulfate 108 (90 Base) MCG/ACT AEPB Inhale 1-2 puffs into the lungs as needed.     . ALPRAZolam (XANAX) 0.5 MG tablet Take 1 tablet (0.5 mg total) by mouth 2 (two) times daily as needed for anxiety. 30 tablet 1  . bisacodyl (DULCOLAX) 5 MG EC tablet Take 5-15 mg by mouth daily as needed for moderate constipation.    Marland Kitchen buPROPion (WELLBUTRIN XL) 300 MG 24 hr tablet Take 1 tablet (300 mg total) by mouth daily. 90 tablet 0  . cholecalciferol (VITAMIN D) 1000 UNITS tablet Take 2,000 Units by mouth daily.    . colchicine 0.6 MG tablet     .  fluticasone (FLONASE) 50 MCG/ACT nasal spray Place 2 sprays into both nostrils daily.  11  . Ginkgo Biloba 40 MG TABS Take 60 mg by mouth 2 (two) times daily.    Marland Kitchen levothyroxine (SYNTHROID, LEVOTHROID) 25 MCG tablet Take 25 mcg by mouth daily before breakfast.     . linaclotide (LINZESS) 72 MCG capsule Take 1 capsule (72 mcg total) by mouth daily before breakfast. 30 capsule 2  . ondansetron (ZOFRAN-ODT) 4 MG disintegrating tablet Take 1 tablet by mouth 3 (three) times daily as needed.  2  . oxcarbazepine (TRILEPTAL) 600 MG tablet Take 2 tablets (1,200 mg total) by mouth at bedtime. 180 tablet 1  . pantoprazole (PROTONIX) 40 MG tablet TAKE ONE TABLET BY MOUTH ONCE DAILY 30 tablet 11  . rivaroxaban (XARELTO) 20 MG TABS tablet Take 20 mg by mouth daily after supper.     . SUPER B COMPLEX/C PO Take by mouth.    . Wheat Dextrin (BENEFIBER PO) Take 1 Dose by mouth as needed (constapation).      No current facility-administered medications for this visit.    Medication Side Effects: resolved  Allergies:  Allergies  Allergen Reactions  . Amlodipine Other (See Comments)    Stopped due to kidney issues  . Vicodin [Hydrocodone-Acetaminophen] Other (See Comments)    Patient is unsure of reaction    Past Medical History:  Diagnosis Date  . Arthritis   . Atrial fibrillation ([place]) 01/12/2010   2D Echo EF=>55%  . Bipolar disorder ([place])   . Chronic back pain   . Depression   . Dysrhythmia    AFib  . History of colonic polyps   . History of gout   . Hyperlipidemia   . Hypertension   . Hypothyroidism   . Morbid obesity ([place])   . OSA (obstructive sleep apnea)    AHI was 27.62hr RDI was 34.3 hr REM 0.00hr; had CPAP years ago but no longer uses.  . Prostatitis   . Tubular adenoma of colon 01/2016    Family History  Problem Relation Age of Onset  . Diabetes Mother   . Heart failure Mother   . Hypertension Mother   . Hypertension Father   . Cancer Paternal Grandmother   . Stroke Sister    . Liver disease Brother   . Vascular Disease Brother   . Arthritis Brother   . Colon cancer Neg Hx     Social History   Socioeconomic History  . Marital status: Married    Spouse name: Not on file  . Number of children: Not on file  . Years of education: Not on  file  . Highest education level: Not on file  Occupational History  . Not on file  Tobacco Use  . Smoking status: Never Smoker  . Smokeless tobacco: Never Used  Substance and Sexual Activity  . Alcohol use: No    Alcohol/week: 0.0 standard drinks  . Drug use: No  . Sexual activity: Yes    Birth control/protection: None  Other Topics Concern  . Not on file  Social History Narrative  . Not on file   Social Determinants of Health   Financial Resource Strain:   . Difficulty of Paying Living Expenses: Not on file  Food Insecurity:   . Worried About Charity fundraiser in the Last Year: Not on file  . Ran Out of Food in the Last Year: Not on file  Transportation Needs:   . Lack of Transportation (Medical): Not on file  . Lack of Transportation (Non-Medical): Not on file  Physical Activity:   . Days of Exercise per Week: Not on file  . Minutes of Exercise per Session: Not on file  Stress:   . Feeling of Stress : Not on file  Social Connections:   . Frequency of Communication with Friends and Family: Not on file  . Frequency of Social Gatherings with Friends and Family: Not on file  . Attends Religious Services: Not on file  . Active Member of Clubs or Organizations: Not on file  . Attends Archivist Meetings: Not on file  . Marital Status: Not on file  Intimate Partner Violence:   . Fear of Current or Ex-Partner: Not on file  . Emotionally Abused: Not on file  . Physically Abused: Not on file  . Sexually Abused: Not on file    Past Medical History, Surgical history, Social history, and Family history were reviewed and updated as appropriate.   Please see review of systems for further details  on the patient's review from today.   Objective:   Physical Exam:  There were no vitals taken for this visit.  Physical Exam Constitutional:      General: He is not in acute distress.    Appearance: He is well-developed.  Musculoskeletal:        General: No deformity.  Neurological:     Mental Status: He is alert and oriented to person, place, and time.     Coordination: Coordination normal.  Psychiatric:        Attention and Perception: He is attentive. He does not perceive auditory hallucinations.        Mood and Affect: Mood is anxious. Mood is not depressed. Affect is not labile, blunt, angry or inappropriate.        Speech: Speech normal.        Behavior: Behavior normal.        Thought Content: Thought content normal. Thought content does not include homicidal or suicidal ideation. Thought content does not include homicidal or suicidal plan.        Cognition and Memory: Cognition normal.        Judgment: Judgment normal.     Comments: Insight intact. No auditory or visual hallucinations. No delusions.  Better cogninition and mood.     Lab Review:     Component Value Date/Time   NA 141 12/28/2018 0941   K 4.0 12/28/2018 0941   CL 104 12/28/2018 0941   CO2 22 12/28/2018 0941   GLUCOSE 88 12/28/2018 0941   GLUCOSE 111 (H) 06/30/2018 1311   BUN 16 12/28/2018  0941   CREATININE 1.30 (H) 12/28/2018 0941   CALCIUM 9.4 12/28/2018 0941   PROT 6.6 12/28/2018 0941   ALBUMIN 4.5 12/28/2018 0941   AST 21 12/28/2018 0941   ALT 22 12/28/2018 0941   ALKPHOS 115 12/28/2018 0941   BILITOT 0.7 12/28/2018 0941   GFRNONAA 55 (L) 12/28/2018 0941   GFRAA 63 12/28/2018 0941       Component Value Date/Time   WBC 8.1 12/28/2018 0941   WBC 5.4 06/30/2018 1311   RBC 4.81 12/28/2018 0941   RBC 4.52 06/30/2018 1311   HGB 15.2 12/28/2018 0941   HCT 42.9 12/28/2018 0941   PLT 186 12/28/2018 0941   MCV 89 12/28/2018 0941   MCH 31.6 12/28/2018 0941   MCH 31.9 06/30/2018 1311   MCHC  35.4 12/28/2018 0941   MCHC 32.8 06/30/2018 1311   RDW 12.9 12/28/2018 0941   LYMPHSABS 1.1 12/28/2018 0941   MONOABS 0.5 06/30/2018 1311   EOSABS 0.1 12/28/2018 0941   BASOSABS 0.0 12/28/2018 0941    No results found for: POCLITH, LITHIUM   No results found for: PHENYTOIN, PHENOBARB, VALPROATE, CBMZ   .res Assessment: Plan:    [place] was seen today for follow-up, sleeping problem, anxiety and depression.  Diagnoses and all orders for this visit:  Bipolar affective disorder, currently depressed, mild (HCC) -     buPROPion (WELLBUTRIN XL) 300 MG 24 hr tablet; Take 1 tablet (300 mg total) by mouth daily. -     oxcarbazepine (TRILEPTAL) 600 MG tablet; Take 2 tablets (1,200 mg total) by mouth at bedtime.  Generalized anxiety disorder  Tremor due to multiple drugs  Bipolar depression is not fully managed.  He is isolated, inactive, mainly watching TV, and has reduced interest and motivation.  He is not profoundly sad.  He was making progress at the last visit and we want to have him continue to be physically active and mentally stimulated.  Emphasize this to both he and his wife.  He needs to continue exercising and specifically walking and if he does these things we may be able to discontinue the Wellbutrin in the future.  He seemed to be making progress at the last visit but it appears to have stalled.  Balance problem much improved with dosing Trileptal all in the evening.  Depression under partial control & wife wants it to be better per his wife.  No mood swings.   Increase bupropion XL to 2 of the 150 mg tablets in the morning until the current bottle is gone. Then new RX will be 1 of the 300 mg tablets each morning.  If this fails or not tolerated consider buspirone, pramipexole off label, etc.  Anxiety is not fully managed.  Partly related to depression.  Tremor is manageable.  Discussed the that excess caffeine with Wellbutrin could exacerbate it.  Minimize benzodiazepine  because of the possible side effect of confusion.  At this time he is taking it rarely.  We discussed the short-term risks associated with benzodiazepines including sedation and increased fall risk among others.  Discussed long-term side effect risk including dependence, potential withdrawal symptoms, and the potential eventual dose-related risk of dementia.  Discussed side effect of each medication  Follow-up 6 to 8 weeks  This was a 30-minute appointment  Lynder Parents, MD, DFAPA  Please see After Visit Summary for patient specific instructions.  Future Appointments  Date Time Provider [place]  11/08/2019 11:00 AM Cottle, Billey Co., MD [facility] None  No orders of the defined types were placed in this encounter.     -------------------------------

## 2019-09-13 NOTE — Patient Instructions (Signed)
Increase bupropion XL to 2 of the 150 mg tablets in the morning until the current bottle is gone. Then new RX will be 1 of the 300 mg tablets each morning.

## 2019-09-22 DIAGNOSIS — Z23 Encounter for immunization: Secondary | ICD-10-CM | POA: Diagnosis not present

## 2019-10-13 DIAGNOSIS — E039 Hypothyroidism, unspecified: Secondary | ICD-10-CM | POA: Diagnosis not present

## 2019-10-13 DIAGNOSIS — I4821 Permanent atrial fibrillation: Secondary | ICD-10-CM | POA: Diagnosis not present

## 2019-10-13 DIAGNOSIS — F3162 Bipolar disorder, current episode mixed, moderate: Secondary | ICD-10-CM | POA: Diagnosis not present

## 2019-10-13 DIAGNOSIS — I129 Hypertensive chronic kidney disease with stage 1 through stage 4 chronic kidney disease, or unspecified chronic kidney disease: Secondary | ICD-10-CM | POA: Diagnosis not present

## 2019-10-20 DIAGNOSIS — Z23 Encounter for immunization: Secondary | ICD-10-CM | POA: Diagnosis not present

## 2019-11-08 ENCOUNTER — Ambulatory Visit (INDEPENDENT_AMBULATORY_CARE_PROVIDER_SITE_OTHER): Payer: Medicare Other | Admitting: Psychiatry

## 2019-11-08 ENCOUNTER — Encounter: Payer: Self-pay | Admitting: Psychiatry

## 2019-11-08 ENCOUNTER — Other Ambulatory Visit: Payer: Self-pay

## 2019-11-08 DIAGNOSIS — F3131 Bipolar disorder, current episode depressed, mild: Secondary | ICD-10-CM | POA: Diagnosis not present

## 2019-11-08 DIAGNOSIS — F411 Generalized anxiety disorder: Secondary | ICD-10-CM | POA: Diagnosis not present

## 2019-11-08 DIAGNOSIS — G251 Drug-induced tremor: Secondary | ICD-10-CM

## 2019-11-08 MED ORDER — PRAMIPEXOLE DIHYDROCHLORIDE 0.25 MG PO TABS: ORAL_TABLET | ORAL | 1 refills | Status: DC

## 2019-11-08 NOTE — Patient Instructions (Signed)
If no improvement in 3 weeks, then call and we'll increase  Pramipexole.

## 2019-11-08 NOTE — Progress Notes (Signed)
CARLIE FRANCHINA [id number] 1946/07/07 74 y.o.    Subjective:   Patient ID:  Casey [name] is a 74 y.o. (DOB Mar 09, 1946) male.  Chief Complaint:  Chief Complaint  Patient presents with  . Depression  . Follow-up  . Anxiety    HPI seen with wife Demontez Derousse presents for follow-up of bipolar disorder and anxiety and history of confusion  At visit  Dec 02, 2018.  He was having confusion and benztropine was stopped to see if that was the cause.  Also it was having balance issues and therefore Trileptal was switched all to nighttime 1200 mg nightly. Confusion much better per both of them with DC benztropine.  Balance better with change Trileptal.   visit was January 13, 2019 and he was doing well as noted above and he was encouraged not to make further med changes at that time.  visit April 13, 2019.  He was doing better with regard to depression than he had in quite some time on the Wellbutrin and no meds were changed.  He was having health problems with pulmonary effusion and fluid removed twice related to fall in January 2020.  Last seen September 13, 2019.  For residual depression the following decisions were made: Balance problem much improved with dosing Trileptal all in the evening. Depression under partial control & wife wants it to be better per his wife.  No mood swings.   Increase bupropion XL to 2 of the 150 mg tablets in the morning until the current bottle is gone. Then new RX will be 1 of the 300 mg tablets each morning.  November 08, 2019 appointment the following is noted:  Seen with wife, [name]. Small amount of improvement with change.  Anxiety got worse.  Notices when he goes out.  Worries about social and political things.  Wife keeps him active at home.  Allerigies bother him..  No sig caffeine except tea when goes out.  Wife sees him as a little more active and initiative.  More productive.  Started some cooking.  Getting out and up better.  A little more  talkative.   "He wants to be hyper but we know what happens".  Wife [name] thinks   But for the last 2 years he's done nothing and laid around all the time.  He's a lot more active now. Big improvement. Went to church first time in 2 years.  Patient reports stable mood and denies depressed or irritable moods.   Gets out more now.  Patient denies difficulty with sleep initiation or maintenance, 8 hours. Denies appetite disturbance.  Patient reports that energy and motivation have been good.  Patient has difficulty with concentration and memory.  Patient denies any suicidal ideation.  Past Psychiatric Medication Trials: Seroquel 300, olanzapine, perphenazine, risperidone,  Lithium helped but SE kidneys and prostate,  Latuda,  Abilify,  alprazolam,  Depakote, Trileptal, carbamazepine, lamotrigine caused nausea, citalopram, sertraline 150,   Wellbutrin 300 anxiety,  Benztropine confusion        1 prior psych hosp depression                                                    Review of Systems:  Review of Systems  Eyes: Positive for visual disturbance.  Skin: Negative for rash.  chronic  Neurological: Negative for tremors and weakness.          Psychiatric/Behavioral: Negative for agitation, behavioral problems, confusion, decreased concentration, dysphoric mood, hallucinations, self-injury, sleep disturbance and suicidal ideas. The patient is not nervous/anxious and is not hyperactive.     Medications: I have reviewed the patient's current medications.  Current Outpatient Medications  Medication Sig Dispense Refill  . acetaminophen (TYLENOL) 500 MG tablet Take 1,000 mg by mouth every 6 (six) hours as needed for headache.    . Albuterol Sulfate 108 (90 Base) MCG/ACT AEPB Inhale 1-2 puffs into the lungs as needed.     . ALPRAZolam (XANAX) 0.5 MG tablet Take 1 tablet (0.5 mg total) by mouth 2 (two) times daily as needed for anxiety. 30 tablet 1  . bisacodyl (DULCOLAX) 5 MG EC tablet  Take 5-15 mg by mouth daily as needed for moderate constipation.    Marland Kitchen buPROPion (WELLBUTRIN XL) 300 MG 24 hr tablet Take 1 tablet (300 mg total) by mouth daily. 90 tablet 0  . cholecalciferol (VITAMIN D) 1000 UNITS tablet Take 2,000 Units by mouth daily.    . colchicine 0.6 MG tablet     . fluticasone (FLONASE) 50 MCG/ACT nasal spray Place 2 sprays into both nostrils daily.  11  . Ginkgo Biloba 40 MG TABS Take 60 mg by mouth 2 (two) times daily.    Marland Kitchen levothyroxine (SYNTHROID, LEVOTHROID) 25 MCG tablet Take 25 mcg by mouth daily before breakfast.     . linaclotide (LINZESS) 72 MCG capsule Take 1 capsule (72 mcg total) by mouth daily before breakfast. 30 capsule 2  . ondansetron (ZOFRAN-ODT) 4 MG disintegrating tablet Take 1 tablet by mouth 3 (three) times daily as needed.  2  . oxcarbazepine (TRILEPTAL) 600 MG tablet Take 2 tablets (1,200 mg total) by mouth at bedtime. 180 tablet 1  . pantoprazole (PROTONIX) 40 MG tablet TAKE ONE TABLET BY MOUTH ONCE DAILY 30 tablet 11  . rivaroxaban (XARELTO) 20 MG TABS tablet Take 20 mg by mouth daily after supper.     . SUPER B COMPLEX/C PO Take by mouth.    . Wheat Dextrin (BENEFIBER PO) Take 1 Dose by mouth as needed (constapation).     . pramipexole (MIRAPEX) 0.25 MG tablet 1/2 tablet twice daily for 1 week then 1 twice daily. 60 tablet 1   No current facility-administered medications for this visit.    Medication Side Effects: resolved  Allergies:  Allergies  Allergen Reactions  . Amlodipine Other (See Comments)    Stopped due to kidney issues  . Vicodin [Hydrocodone-Acetaminophen] Other (See Comments)    Patient is unsure of reaction    Past Medical History:  Diagnosis Date  . Arthritis   . Atrial fibrillation ([place]) 01/12/2010   2D Echo EF=>55%  . Bipolar disorder ([place])   . Chronic back pain   . Depression   . Dysrhythmia    AFib  . History of colonic polyps   . History of gout   . Hyperlipidemia   . Hypertension   . Hypothyroidism    . Morbid obesity ([place])   . OSA (obstructive sleep apnea)    AHI was 27.62hr RDI was 34.3 hr REM 0.00hr; had CPAP years ago but no longer uses.  . Prostatitis   . Tubular adenoma of colon 01/2016    Family History  Problem Relation Age of Onset  . Diabetes Mother   . Heart failure Mother   . Hypertension Mother   .  Hypertension Father   . Cancer Paternal Grandmother   . Stroke Sister   . Liver disease Brother   . Vascular Disease Brother   . Arthritis Brother   . Colon cancer Neg Hx     Social History   Socioeconomic History  . Marital status: Married    Spouse name: Not on file  . Number of children: Not on file  . Years of education: Not on file  . Highest education level: Not on file  Occupational History  . Not on file  Tobacco Use  . Smoking status: Never Smoker  . Smokeless tobacco: Never Used  Substance and Sexual Activity  . Alcohol use: No    Alcohol/week: 0.0 standard drinks  . Drug use: No  . Sexual activity: Yes    Birth control/protection: None  Other Topics Concern  . Not on file  Social History Narrative  . Not on file   Social Determinants of Health   Financial Resource Strain:   . Difficulty of Paying Living Expenses:   Food Insecurity:   . Worried About Charity fundraiser in the Last Year:   . Arboriculturist in the Last Year:   Transportation Needs:   . Film/video editor (Medical):   Marland Kitchen Lack of Transportation (Non-Medical):   Physical Activity:   . Days of Exercise per Week:   . Minutes of Exercise per Session:   Stress:   . Feeling of Stress :   Social Connections:   . Frequency of Communication with Friends and Family:   . Frequency of Social Gatherings with Friends and Family:   . Attends Religious Services:   . Active Member of Clubs or Organizations:   . Attends Archivist Meetings:   Marland Kitchen Marital Status:   Intimate Partner Violence:   . Fear of Current or Ex-Partner:   . Emotionally Abused:   Marland Kitchen Physically  Abused:   . Sexually Abused:     Past Medical History, Surgical history, Social history, and Family history were reviewed and updated as appropriate.   Please see review of systems for further details on the patient's review from today.   Objective:   Physical Exam:  There were no vitals taken for this visit.  Physical Exam Constitutional:      General: He is not in acute distress.    Appearance: He is well-developed.  Musculoskeletal:        General: No deformity.  Neurological:     Mental Status: He is alert and oriented to person, place, and time.     Coordination: Coordination normal.  Psychiatric:        Attention and Perception: He is attentive. He does not perceive auditory hallucinations.        Mood and Affect: Mood is anxious and depressed. Affect is not labile, blunt, angry or inappropriate.        Speech: Speech normal.        Behavior: Behavior normal.        Thought Content: Thought content normal. Thought content does not include homicidal or suicidal ideation. Thought content does not include homicidal or suicidal plan.        Cognition and Memory: Cognition normal.        Judgment: Judgment normal.     Comments: Insight intact. No auditory or visual hallucinations. No delusions.  Better cogninition and mood. Residual depression.     Lab Review:     Component Value Date/Time   NA  141 12/28/2018 0941   K 4.0 12/28/2018 0941   CL 104 12/28/2018 0941   CO2 22 12/28/2018 0941   GLUCOSE 88 12/28/2018 0941   GLUCOSE 111 (H) 06/30/2018 1311   BUN 16 12/28/2018 0941   CREATININE 1.30 (H) 12/28/2018 0941   CALCIUM 9.4 12/28/2018 0941   PROT 6.6 12/28/2018 0941   ALBUMIN 4.5 12/28/2018 0941   AST 21 12/28/2018 0941   ALT 22 12/28/2018 0941   ALKPHOS 115 12/28/2018 0941   BILITOT 0.7 12/28/2018 0941   GFRNONAA 55 (L) 12/28/2018 0941   GFRAA 63 12/28/2018 0941       Component Value Date/Time   WBC 8.1 12/28/2018 0941   WBC 5.4 06/30/2018 1311   RBC  4.81 12/28/2018 0941   RBC 4.52 06/30/2018 1311   HGB 15.2 12/28/2018 0941   HCT 42.9 12/28/2018 0941   PLT 186 12/28/2018 0941   MCV 89 12/28/2018 0941   MCH 31.6 12/28/2018 0941   MCH 31.9 06/30/2018 1311   MCHC 35.4 12/28/2018 0941   MCHC 32.8 06/30/2018 1311   RDW 12.9 12/28/2018 0941   LYMPHSABS 1.1 12/28/2018 0941   MONOABS 0.5 06/30/2018 1311   EOSABS 0.1 12/28/2018 0941   BASOSABS 0.0 12/28/2018 0941    No results found for: POCLITH, LITHIUM   No results found for: PHENYTOIN, PHENOBARB, VALPROATE, CBMZ   .res Assessment: Plan:    [place] was seen today for depression, follow-up and anxiety.  Diagnoses and all orders for this visit:  Bipolar affective disorder, currently depressed, mild (HCC) -     pramipexole (MIRAPEX) 0.25 MG tablet; 1/2 tablet twice daily for 1 week then 1 twice daily.  Generalized anxiety disorder  Tremor due to multiple drugs  Dx bipolar at 74 yo.  Bipolar depression is not fully managed.  He is isolated, inactive, mainly watching TV, and has reduced interest and motivation.  He is not profoundly sad.  He was making progress at the last visit and we want to have him continue to be physically active and mentally stimulated.  Emphasize this to both he and his wife.  He needs to continue exercising and specifically walking and if he does these things we may be able to discontinue the Wellbutrin in the future.    Depression under partial control & wife wants it to be better per his wife.  No mood swings.   Continue bupropion XL 300 mg tablets each morning.  Consider buspirone, pramipexole off label, etc. Consider low dose lithium bc best response ever was with lithium.  Disc SE In detail and risk mania with pramipexole but potentially greater benefit and might be able to replace Wellbutrin.   Pramipexole 0.25 mg tablet 1/2 twice daily for 1 week then 1 twice daily.  Anxiety is not fully managed.  Partly related to depression.  Tremor is manageable.   Discussed the that excess caffeine with Wellbutrin could exacerbate it. Balance problem much improved with dosing Trileptal all in the evening 1200 mg HS.    Minimize benzodiazepine because of the possible side effect of confusion.  At this time he is taking it rarely.  We discussed the short-term risks associated with benzodiazepines including sedation and increased fall risk among others.  Discussed long-term side effect risk including dependence, potential withdrawal symptoms, and the potential eventual dose-related risk of dementia.  Discussed side effect of each medication  Follow-up 5 weeks  This was a 30-minute appointment  Lynder Parents, MD, DFAPA  Please see After Visit  Summary for patient specific instructions.  No future appointments.  No orders of the defined types were placed in this encounter.     -------------------------------

## 2019-12-20 ENCOUNTER — Other Ambulatory Visit: Payer: Self-pay

## 2019-12-20 ENCOUNTER — Ambulatory Visit (INDEPENDENT_AMBULATORY_CARE_PROVIDER_SITE_OTHER): Payer: Medicare Other | Admitting: Psychiatry

## 2019-12-20 ENCOUNTER — Encounter: Payer: Self-pay | Admitting: Psychiatry

## 2019-12-20 ENCOUNTER — Telehealth: Payer: Self-pay | Admitting: Psychiatry

## 2019-12-20 DIAGNOSIS — F3131 Bipolar disorder, current episode depressed, mild: Secondary | ICD-10-CM

## 2019-12-20 DIAGNOSIS — F3132 Bipolar disorder, current episode depressed, moderate: Secondary | ICD-10-CM | POA: Diagnosis not present

## 2019-12-20 DIAGNOSIS — G251 Drug-induced tremor: Secondary | ICD-10-CM | POA: Diagnosis not present

## 2019-12-20 DIAGNOSIS — F411 Generalized anxiety disorder: Secondary | ICD-10-CM

## 2019-12-20 MED ORDER — PRAMIPEXOLE DIHYDROCHLORIDE 0.25 MG PO TABS: 0.3750 mg | ORAL_TABLET | Freq: Two times a day (BID) | ORAL | 1 refills | Status: DC

## 2019-12-20 NOTE — Progress Notes (Signed)
Casey [name] [id number] Jul 07, 1946 74 y.o.    Subjective:   Patient ID:  Casey [name] is a 74 y.o. (DOB 1946/02/01) male.  Chief Complaint:  Chief Complaint  Patient presents with  . Follow-up  . Depression  . Anxiety  . Memory Loss    HPI seen with wife Marshon Bangs presents for follow-up of bipolar disorder and anxiety and history of confusion  At visit  Dec 02, 2018.  He was having confusion and benztropine was stopped to see if that was the cause.  Also it was having balance issues and therefore Trileptal was switched all to nighttime 1200 mg nightly. Confusion much better per both of them with DC benztropine.  Balance better with change Trileptal.   visit was January 13, 2019 and he was doing well as noted above and he was encouraged not to make further med changes at that time.  visit April 13, 2019.  He was doing better with regard to depression than he had in quite some time on the Wellbutrin and no meds were changed.  He was having health problems with pulmonary effusion and fluid removed twice related to fall in January 2020.  Last seen September 13, 2019.  For residual depression the following decisions were made: Balance problem much improved with dosing Trileptal all in the evening. Depression under partial control & wife wants it to be better per his wife.  No mood swings.   Increase bupropion XL to 2 of the 150 mg tablets in the morning until the current bottle is gone. Then new RX will be 1 of the 300 mg tablets each morning.  November 08, 2019 appointment the following is noted:  Seen with wife, [name]. Small amount of improvement with change.  Anxiety got worse.  Notices when he goes out.  Worries about social and political things.  Wife keeps him active at home.  Allerigies bother him..  No sig caffeine except tea when goes out.  Wife sees him as a little more active and initiative.  More productive.  Started some cooking.  Getting out and up better.  A  little more talkative.   "He wants to be hyper but we know what happens". Wife [name] thinks   But for the last 2 years he's done nothing and laid around all the time.  He's a lot more active now. Big improvement. Went to church first time in 2 years. Plan:  For TRD, Pramipexole 0.25 mg tablet 1/2 twice daily for 1 week then 1 twice daily.  12/20/2019 appointment with the following noted:  Seen with wife. A little better motivated to get out but not to get in crowds.  Wife agrees more motivated and went to church once alone without pressure.  Wife says he's more talkative and much  Better interest.  Still not motivated to do things like mow the grass.  Others's have seen a difference. Concentration and memory are improved but not normal. No Se pramipexole.  Getting out of bed earlier and better now.. Wife admisters meds bc earlier he messsed them up.  Patient reports stable mood and denies irritable moods.   Gets out more now. Still some depression and low motivation and low confidence.   Patient denies difficulty with sleep initiation or maintenance, 8 hours. Denies appetite disturbance.  Patient reports that energy and motivation have been good.  Patient has difficulty with concentration and memory.  Patient denies any suicidal ideation.   Past Psychiatric Medication Trials:  Seroquel 300, olanzapine, perphenazine, risperidone,  Lithium helped but SE kidneys and prostate,  Latuda,  Abilify,  alprazolam,  Depakote, Trileptal, carbamazepine, lamotrigine caused nausea, citalopram, sertraline 150,   Wellbutrin 300 anxiety,  Benztropine confusion        1 prior psych hosp depression                                                    Review of Systems:  Review of Systems  Constitutional: Positive for fatigue.  Eyes: Positive for visual disturbance.  Skin: Negative for rash.       chronic  Neurological: Negative for tremors and weakness.          Psychiatric/Behavioral: Negative for agitation,  behavioral problems, confusion, decreased concentration, dysphoric mood, hallucinations, self-injury, sleep disturbance and suicidal ideas. The patient is not nervous/anxious and is not hyperactive.     Medications: I have reviewed the patient's current medications.  Current Outpatient Medications  Medication Sig Dispense Refill  . acetaminophen (TYLENOL) 500 MG tablet Take 1,000 mg by mouth every 6 (six) hours as needed for headache.    . Albuterol Sulfate 108 (90 Base) MCG/ACT AEPB Inhale 1-2 puffs into the lungs as needed.     . ALPRAZolam (XANAX) 0.5 MG tablet Take 1 tablet (0.5 mg total) by mouth 2 (two) times daily as needed for anxiety. 30 tablet 1  . bisacodyl (DULCOLAX) 5 MG EC tablet Take 5-15 mg by mouth daily as needed for moderate constipation.    Marland Kitchen buPROPion (WELLBUTRIN XL) 300 MG 24 hr tablet Take 1 tablet (300 mg total) by mouth daily. 90 tablet 0  . cholecalciferol (VITAMIN D) 1000 UNITS tablet Take 2,000 Units by mouth daily.    . colchicine 0.6 MG tablet     . fluticasone (FLONASE) 50 MCG/ACT nasal spray Place 2 sprays into both nostrils daily.  11  . Ginkgo Biloba 40 MG TABS Take 60 mg by mouth 2 (two) times daily.    Marland Kitchen levothyroxine (SYNTHROID, LEVOTHROID) 25 MCG tablet Take 25 mcg by mouth daily before breakfast.     . linaclotide (LINZESS) 72 MCG capsule Take 1 capsule (72 mcg total) by mouth daily before breakfast. 30 capsule 2  . ondansetron (ZOFRAN-ODT) 4 MG disintegrating tablet Take 1 tablet by mouth 3 (three) times daily as needed.  2  . oxcarbazepine (TRILEPTAL) 600 MG tablet Take 2 tablets (1,200 mg total) by mouth at bedtime. 180 tablet 1  . pantoprazole (PROTONIX) 40 MG tablet TAKE ONE TABLET BY MOUTH ONCE DAILY 30 tablet 11  . pramipexole (MIRAPEX) 0.25 MG tablet 1/2 tablet twice daily for 1 week then 1 twice daily. 60 tablet 1  . rivaroxaban (XARELTO) 20 MG TABS tablet Take 20 mg by mouth daily after supper.     . SUPER B COMPLEX/C PO Take by mouth.    .  Wheat Dextrin (BENEFIBER PO) Take 1 Dose by mouth as needed (constapation).      No current facility-administered medications for this visit.    Medication Side Effects: resolved  Allergies:  Allergies  Allergen Reactions  . Amlodipine Other (See Comments)    Stopped due to kidney issues  . Vicodin [Hydrocodone-Acetaminophen] Other (See Comments)    Patient is unsure of reaction    Past Medical History:  Diagnosis Date  . Arthritis   .  Atrial fibrillation ([place]) 01/12/2010   2D Echo EF=>55%  . Bipolar disorder ([place])   . Chronic back pain   . Depression   . Dysrhythmia    AFib  . History of colonic polyps   . History of gout   . Hyperlipidemia   . Hypertension   . Hypothyroidism   . Morbid obesity ([place])   . OSA (obstructive sleep apnea)    AHI was 27.62hr RDI was 34.3 hr REM 0.00hr; had CPAP years ago but no longer uses.  . Prostatitis   . Tubular adenoma of colon 01/2016    Family History  Problem Relation Age of Onset  . Diabetes Mother   . Heart failure Mother   . Hypertension Mother   . Hypertension Father   . Cancer Paternal Grandmother   . Stroke Sister   . Liver disease Brother   . Vascular Disease Brother   . Arthritis Brother   . Colon cancer Neg Hx     Social History   Socioeconomic History  . Marital status: Married    Spouse name: Not on file  . Number of children: Not on file  . Years of education: Not on file  . Highest education level: Not on file  Occupational History  . Not on file  Tobacco Use  . Smoking status: Never Smoker  . Smokeless tobacco: Never Used  Substance and Sexual Activity  . Alcohol use: No    Alcohol/week: 0.0 standard drinks  . Drug use: No  . Sexual activity: Yes    Birth control/protection: None  Other Topics Concern  . Not on file  Social History Narrative  . Not on file   Social Determinants of Health   Financial Resource Strain:   . Difficulty of Paying Living Expenses:   Food Insecurity:   .  Worried About Charity fundraiser in the Last Year:   . Arboriculturist in the Last Year:   Transportation Needs:   . Film/video editor (Medical):   Marland Kitchen Lack of Transportation (Non-Medical):   Physical Activity:   . Days of Exercise per Week:   . Minutes of Exercise per Session:   Stress:   . Feeling of Stress :   Social Connections:   . Frequency of Communication with Friends and Family:   . Frequency of Social Gatherings with Friends and Family:   . Attends Religious Services:   . Active Member of Clubs or Organizations:   . Attends Archivist Meetings:   Marland Kitchen Marital Status:   Intimate Partner Violence:   . Fear of Current or Ex-Partner:   . Emotionally Abused:   Marland Kitchen Physically Abused:   . Sexually Abused:     Past Medical History, Surgical history, Social history, and Family history were reviewed and updated as appropriate.   Please see review of systems for further details on the patient's review from today.   Objective:   Physical Exam:  There were no vitals taken for this visit.  Physical Exam Constitutional:      General: He is not in acute distress.    Appearance: He is well-developed.  Musculoskeletal:        General: No deformity.  Neurological:     Mental Status: He is alert and oriented to person, place, and time.     Coordination: Coordination normal.  Psychiatric:        Attention and Perception: He is attentive. He does not perceive auditory hallucinations.  Mood and Affect: Mood is anxious and depressed. Affect is not labile, blunt, angry or inappropriate.        Speech: Speech normal.        Behavior: Behavior normal.        Thought Content: Thought content normal. Thought content does not include homicidal or suicidal ideation. Thought content does not include homicidal or suicidal plan.        Cognition and Memory: Cognition normal.        Judgment: Judgment normal.     Comments: Insight intact. No auditory or visual hallucinations.  No delusions.  Better cogninition and mood. Residual depression.     Lab Review:     Component Value Date/Time   NA 141 12/28/2018 0941   K 4.0 12/28/2018 0941   CL 104 12/28/2018 0941   CO2 22 12/28/2018 0941   GLUCOSE 88 12/28/2018 0941   GLUCOSE 111 (H) 06/30/2018 1311   BUN 16 12/28/2018 0941   CREATININE 1.30 (H) 12/28/2018 0941   CALCIUM 9.4 12/28/2018 0941   PROT 6.6 12/28/2018 0941   ALBUMIN 4.5 12/28/2018 0941   AST 21 12/28/2018 0941   ALT 22 12/28/2018 0941   ALKPHOS 115 12/28/2018 0941   BILITOT 0.7 12/28/2018 0941   GFRNONAA 55 (L) 12/28/2018 0941   GFRAA 63 12/28/2018 0941       Component Value Date/Time   WBC 8.1 12/28/2018 0941   WBC 5.4 06/30/2018 1311   RBC 4.81 12/28/2018 0941   RBC 4.52 06/30/2018 1311   HGB 15.2 12/28/2018 0941   HCT 42.9 12/28/2018 0941   PLT 186 12/28/2018 0941   MCV 89 12/28/2018 0941   MCH 31.6 12/28/2018 0941   MCH 31.9 06/30/2018 1311   MCHC 35.4 12/28/2018 0941   MCHC 32.8 06/30/2018 1311   RDW 12.9 12/28/2018 0941   LYMPHSABS 1.1 12/28/2018 0941   MONOABS 0.5 06/30/2018 1311   EOSABS 0.1 12/28/2018 0941   BASOSABS 0.0 12/28/2018 0941    No results found for: POCLITH, LITHIUM   No results found for: PHENYTOIN, PHENOBARB, VALPROATE, CBMZ   .res Assessment: Plan:    Tray was seen today for follow-up, depression, anxiety and memory loss.  Diagnoses and all orders for this visit:  Bipolar affective disorder, currently depressed, moderate (HCC)  Generalized anxiety disorder  Tremor due to multiple drugs  Dx bipolar at 74 yo.  Bipolar depression is not fully managed.  He is isolated, inactive, mainly watching TV, and has reduced interest and motivation.  He is not profoundly sad.  He was making progress at the last visit and we want to have him continue to be physically active and mentally stimulated.  Emphasize this to both he and his wife.  He needs to continue exercising and specifically walking and if he does  these things we may be able to discontinue the Wellbutrin in the future.    Depression under partial control & wife wants it to be better per his wife.  No mood swings.   Continue bupropion XL 300 mg tablets each morning.  Consider buspirone, pramipexole off label, etc. Consider low dose lithium bc best response ever was with lithium.  Disc SE In detail and risk mania with pramipexole but potentially greater benefit and might be able to replace Wellbutrin.   Wife has seen a big improvement therefore Partial improvement is clear from this so increase Pramipexole 0.375 mg tablet  twice daily  Anxiety is not fully managed.  Partly related to depression.  Tremor is manageable.  Discussed the that excess caffeine with Wellbutrin could exacerbate it. Balance problem much improved with dosing Trileptal all in the evening 1200 mg HS.    Minimize benzodiazepine because of the possible side effect of confusion.  At this time he is taking it rarely.  We discussed the short-term risks associated with benzodiazepines including sedation and increased fall risk among others.  Discussed long-term side effect risk including dependence, potential withdrawal symptoms, and the potential eventual dose-related risk of dementia.  Discussed side effect of each medication  Follow-up 6 weeks  This was a 30-minute appointment  Lynder Parents, MD, DFAPA   Please see After Visit Summary for patient specific instructions.  No future appointments.  No orders of the defined types were placed in this encounter.     -------------------------------

## 2019-12-20 NOTE — Telephone Encounter (Signed)
PT's wife called back. Will need a new dose increase called in on Pramipexole. Use the pharmacy on file- [name]

## 2019-12-20 NOTE — Telephone Encounter (Signed)
Pended for Dr. Clovis Pu to submit with new dose

## 2019-12-27 ENCOUNTER — Other Ambulatory Visit: Payer: Self-pay | Admitting: Psychiatry

## 2019-12-27 DIAGNOSIS — F3131 Bipolar disorder, current episode depressed, mild: Secondary | ICD-10-CM

## 2020-02-15 ENCOUNTER — Ambulatory Visit (INDEPENDENT_AMBULATORY_CARE_PROVIDER_SITE_OTHER): Payer: Medicare Other | Admitting: Psychiatry

## 2020-02-15 ENCOUNTER — Encounter: Payer: Self-pay | Admitting: Psychiatry

## 2020-02-15 ENCOUNTER — Other Ambulatory Visit: Payer: Self-pay

## 2020-02-15 DIAGNOSIS — G251 Drug-induced tremor: Secondary | ICD-10-CM | POA: Diagnosis not present

## 2020-02-15 DIAGNOSIS — F3131 Bipolar disorder, current episode depressed, mild: Secondary | ICD-10-CM | POA: Diagnosis not present

## 2020-02-15 DIAGNOSIS — F411 Generalized anxiety disorder: Secondary | ICD-10-CM

## 2020-02-15 DIAGNOSIS — F3132 Bipolar disorder, current episode depressed, moderate: Secondary | ICD-10-CM | POA: Diagnosis not present

## 2020-02-15 DIAGNOSIS — R410 Disorientation, unspecified: Secondary | ICD-10-CM | POA: Diagnosis not present

## 2020-02-15 MED ORDER — PRAMIPEXOLE DIHYDROCHLORIDE 0.75 MG PO TABS: 0.3750 mg | ORAL_TABLET | Freq: Two times a day (BID) | ORAL | 1 refills | Status: DC

## 2020-02-15 MED ORDER — BUPROPION HCL ER (XL) 300 MG PO TB24: 300.0000 mg | ORAL_TABLET | Freq: Every day | ORAL | 1 refills | Status: DC

## 2020-02-15 MED ORDER — PRAMIPEXOLE DIHYDROCHLORIDE 0.25 MG PO TABS: 0.3750 mg | ORAL_TABLET | Freq: Two times a day (BID) | ORAL | 1 refills | Status: DC

## 2020-02-15 MED ORDER — OXCARBAZEPINE 600 MG PO TABS: 1200.0000 mg | ORAL_TABLET | Freq: Every day | ORAL | 1 refills | Status: DC

## 2020-02-15 NOTE — Progress Notes (Signed)
ARSLAN KIER [id number] [birth year] 74 y.o.    Subjective:   Patient ID:  Casey [name] is a 74 y.o. (DOB 02-12-1946) male.  Chief Complaint:  Chief Complaint  Patient presents with  . Follow-up  . Depression  . Anxiety    HPI seen with wife Jonahtan Manseau presents for follow-up of bipolar disorder and anxiety and history of confusion  At visit  Dec 02, 2018.  He was having confusion and benztropine was stopped to see if that was the cause.  Also it was having balance issues and therefore Trileptal was switched all to nighttime 1200 mg nightly. Confusion much better per both of them with DC benztropine.  Balance better with change Trileptal.   visit was January 13, 2019 and he was doing well as noted above and he was encouraged not to make further med changes at that time.  visit April 13, 2019.  He was doing better with regard to depression than he had in quite some time on the Wellbutrin and no meds were changed.  He was having health problems with pulmonary effusion and fluid removed twice related to fall in January 2020.  Last seen September 13, 2019.  For residual depression the following decisions were made: Balance problem much improved with dosing Trileptal all in the evening. Depression under partial control & wife wants it to be better per his wife.  No mood swings.   Increase bupropion XL to 2 of the 150 mg tablets in the morning until the current bottle is gone. Then new RX will be 1 of the 300 mg tablets each morning.  November 08, 2019 appointment the following is noted:  Seen with wife, [name]. Small amount of improvement with change.  Anxiety got worse.  Notices when he goes out.  Worries about social and political things.  Wife keeps him active at home.  Allerigies bother him..  No sig caffeine except tea when goes out.  Wife sees him as a little more active and initiative.  More productive.  Started some cooking.  Getting out and up better.  A little more  talkative.   "He wants to be hyper but we know what happens". Wife [name] thinks   But for the last 2 years he's done nothing and laid around all the time.  He's a lot more active now. Big improvement. Went to church first time in 2 years. Plan:  For TRD, Pramipexole 0.25 mg tablet 1/2 twice daily for 1 week then 1 twice daily.  12/20/2019 appointment with the following noted:  Seen with wife. A little better motivated to get out but not to get in crowds.  Wife agrees more motivated and went to church once alone without pressure.  Wife says he's more talkative and much  Better interest.  Still not motivated to do things like mow the grass.  Others's have seen a difference. Concentration and memory are improved but not normal. No Se pramipexole.  Getting out of bed earlier and better now.. Wife admisters meds bc earlier he messsed them up. Plan: Wife has seen a big improvement therefore Partial improvement is clear from this so increase Pramipexole 0.375 mg tablet  twice daily  Anxiety is not fully managed.  Partly related to depression. Tremor is manageable.    02/15/20 appt with the following noted:  Send with wife [name] Doing good.  Increased pramipexole as indicated.  Tolerating meds Wellbutrin XL 300 mg AM & Trileptal 1200 mg HS with it.  Much better with depression.  Stays up more and speinding less time in bed.   Wife agrees he's much better and handling things better. 2 1/2 yo 4th cousin girl drowned in pond lately.  Big family. Wife pleased he handled it better. SE constipation ? Related. History Linzess but diarrhea. He's not highly motivated but it is better.  Depression is much improved.  Had been depressed for years.  Patient reports stable mood and denies irritable moods.   Gets out more now. No sig depression and low motivation and low confidence.   Patient denies difficulty with sleep initiation or maintenance, 8 hours. Denies appetite disturbance.  Patient reports that energy and  motivation have been good.  Patient has difficulty with concentration and memory.  Patient denies any suicidal ideation.   Past Psychiatric Medication Trials: Seroquel 300, olanzapine, perphenazine, risperidone,  Lithium helped but SE kidneys and prostate,  Latuda,  Abilify,  alprazolam,  Depakote, Trileptal, carbamazepine, lamotrigine caused nausea, citalopram, sertraline 150,   Wellbutrin 300 anxiety,  Pramipexole 0.375 BID Benztropine confusion        1 prior psych hosp depression                                                    Review of Systems:  Review of Systems  Constitutional: Negative for fatigue.  Eyes: Positive for visual disturbance.  Gastrointestinal: Positive for constipation.  Skin: Negative for rash.       chronic  Neurological: Negative for tremors and weakness.          Psychiatric/Behavioral: Negative for agitation, behavioral problems, confusion, decreased concentration, dysphoric mood, hallucinations, self-injury, sleep disturbance and suicidal ideas. The patient is not nervous/anxious and is not hyperactive.     Medications: I have reviewed the patient's current medications.  Current Outpatient Medications  Medication Sig Dispense Refill  . acetaminophen (TYLENOL) 500 MG tablet Take 1,000 mg by mouth every 6 (six) hours as needed for headache.    . Albuterol Sulfate 108 (90 Base) MCG/ACT AEPB Inhale 1-2 puffs into the lungs as needed.     . bisacodyl (DULCOLAX) 5 MG EC tablet Take 5-15 mg by mouth daily as needed for moderate constipation.    Marland Kitchen buPROPion (WELLBUTRIN XL) 300 MG 24 hr tablet Take 1 tablet (300 mg total) by mouth daily. 90 tablet 1  . cholecalciferol (VITAMIN D) 1000 UNITS tablet Take 2,000 Units by mouth daily.    . colchicine 0.6 MG tablet     . fluticasone (FLONASE) 50 MCG/ACT nasal spray Place 2 sprays into both nostrils daily.  11  . Ginkgo Biloba 40 MG TABS Take 60 mg by mouth 2 (two) times daily.    Marland Kitchen levothyroxine (SYNTHROID,  LEVOTHROID) 25 MCG tablet Take 25 mcg by mouth daily before breakfast.     . linaclotide (LINZESS) 72 MCG capsule Take 1 capsule (72 mcg total) by mouth daily before breakfast. 30 capsule 2  . ondansetron (ZOFRAN-ODT) 4 MG disintegrating tablet Take 1 tablet by mouth 3 (three) times daily as needed.  2  . oxcarbazepine (TRILEPTAL) 600 MG tablet Take 2 tablets (1,200 mg total) by mouth at bedtime. 180 tablet 1  . pantoprazole (PROTONIX) 40 MG tablet TAKE ONE TABLET BY MOUTH ONCE DAILY 30 tablet 11  . pramipexole (MIRAPEX) 0.75 MG tablet Take 0.5 tablets (0.375 mg total)  by mouth in the morning and at bedtime. 1/2 tablet twice daily for 1 week then 1 twice daily. 90 tablet 1  . rivaroxaban (XARELTO) 20 MG TABS tablet Take 20 mg by mouth daily after supper.     . SUPER B COMPLEX/C PO Take by mouth.    . Wheat Dextrin (BENEFIBER PO) Take 1 Dose by mouth as needed (constapation).      No current facility-administered medications for this visit.    Medication Side Effects: resolved  Allergies:  Allergies  Allergen Reactions  . Amlodipine Other (See Comments)    Stopped due to kidney issues  . Vicodin [Hydrocodone-Acetaminophen] Other (See Comments)    Patient is unsure of reaction    Past Medical History:  Diagnosis Date  . Arthritis   . Atrial fibrillation ([place]) 01/12/2010   2D Echo EF=>55%  . Bipolar disorder ([place])   . Chronic back pain   . Depression   . Dysrhythmia    AFib  . History of colonic polyps   . History of gout   . Hyperlipidemia   . Hypertension   . Hypothyroidism   . Morbid obesity ([place])   . OSA (obstructive sleep apnea)    AHI was 27.62hr RDI was 34.3 hr REM 0.00hr; had CPAP years ago but no longer uses.  . Prostatitis   . Tubular adenoma of colon 01/2016    Family History  Problem Relation Age of Onset  . Diabetes Mother   . Heart failure Mother   . Hypertension Mother   . Hypertension Father   . Cancer Paternal Grandmother   . Stroke Sister   . Liver  disease Brother   . Vascular Disease Brother   . Arthritis Brother   . Colon cancer Neg Hx     Social History   Socioeconomic History  . Marital status: Married    Spouse name: Not on file  . Number of children: Not on file  . Years of education: Not on file  . Highest education level: Not on file  Occupational History  . Not on file  Tobacco Use  . Smoking status: Never Smoker  . Smokeless tobacco: Never Used  Substance and Sexual Activity  . Alcohol use: No    Alcohol/week: 0.0 standard drinks  . Drug use: No  . Sexual activity: Yes    Birth control/protection: None  Other Topics Concern  . Not on file  Social History Narrative  . Not on file   Social Determinants of Health   Financial Resource Strain:   . Difficulty of Paying Living Expenses:   Food Insecurity:   . Worried About Charity fundraiser in the Last Year:   . Arboriculturist in the Last Year:   Transportation Needs:   . Film/video editor (Medical):   Marland Kitchen Lack of Transportation (Non-Medical):   Physical Activity:   . Days of Exercise per Week:   . Minutes of Exercise per Session:   Stress:   . Feeling of Stress :   Social Connections:   . Frequency of Communication with Friends and Family:   . Frequency of Social Gatherings with Friends and Family:   . Attends Religious Services:   . Active Member of Clubs or Organizations:   . Attends Archivist Meetings:   Marland Kitchen Marital Status:   Intimate Partner Violence:   . Fear of Current or Ex-Partner:   . Emotionally Abused:   Marland Kitchen Physically Abused:   . Sexually Abused:  Past Medical History, Surgical history, Social history, and Family history were reviewed and updated as appropriate.   Please see review of systems for further details on the patient's review from today.   Objective:   Physical Exam:  There were no vitals taken for this visit.  Physical Exam Constitutional:      General: He is not in acute distress.    Appearance:  He is well-developed.  Musculoskeletal:        General: No deformity.  Neurological:     Mental Status: He is alert and oriented to person, place, and time.     Coordination: Coordination normal.  Psychiatric:        Attention and Perception: He is attentive. He does not perceive auditory hallucinations.        Mood and Affect: Mood is anxious. Mood is not depressed. Affect is not labile, blunt, angry or inappropriate.        Speech: Speech normal.        Behavior: Behavior normal.        Thought Content: Thought content normal. Thought content does not include homicidal or suicidal ideation. Thought content does not include homicidal or suicidal plan.        Cognition and Memory: Cognition normal.        Judgment: Judgment normal.     Comments: Insight intact. No auditory or visual hallucinations. No delusions.  Better cogninition and mood. Residual depression but better much.     Lab Review:     Component Value Date/Time   NA 141 12/28/2018 0941   K 4.0 12/28/2018 0941   CL 104 12/28/2018 0941   CO2 22 12/28/2018 0941   GLUCOSE 88 12/28/2018 0941   GLUCOSE 111 (H) 06/30/2018 1311   BUN 16 12/28/2018 0941   CREATININE 1.30 (H) 12/28/2018 0941   CALCIUM 9.4 12/28/2018 0941   PROT 6.6 12/28/2018 0941   ALBUMIN 4.5 12/28/2018 0941   AST 21 12/28/2018 0941   ALT 22 12/28/2018 0941   ALKPHOS 115 12/28/2018 0941   BILITOT 0.7 12/28/2018 0941   GFRNONAA 55 (L) 12/28/2018 0941   GFRAA 63 12/28/2018 0941       Component Value Date/Time   WBC 8.1 12/28/2018 0941   WBC 5.4 06/30/2018 1311   RBC 4.81 12/28/2018 0941   RBC 4.52 06/30/2018 1311   HGB 15.2 12/28/2018 0941   HCT 42.9 12/28/2018 0941   PLT 186 12/28/2018 0941   MCV 89 12/28/2018 0941   MCH 31.6 12/28/2018 0941   MCH 31.9 06/30/2018 1311   MCHC 35.4 12/28/2018 0941   MCHC 32.8 06/30/2018 1311   RDW 12.9 12/28/2018 0941   LYMPHSABS 1.1 12/28/2018 0941   MONOABS 0.5 06/30/2018 1311   EOSABS 0.1 12/28/2018  0941   BASOSABS 0.0 12/28/2018 0941    No results found for: POCLITH, LITHIUM   No results found for: PHENYTOIN, PHENOBARB, VALPROATE, CBMZ   .res Assessment: Plan:    Javel was seen today for follow-up, depression and anxiety.  Diagnoses and all orders for this visit:  Bipolar affective disorder, currently depressed, moderate (HCC)  Generalized anxiety disorder  Tremor due to multiple drugs  Subacute confusional state  Bipolar affective disorder, currently depressed, mild (HCC) -     buPROPion (WELLBUTRIN XL) 300 MG 24 hr tablet; Take 1 tablet (300 mg total) by mouth daily. -     oxcarbazepine (TRILEPTAL) 600 MG tablet; Take 2 tablets (1,200 mg total) by mouth at bedtime. -  Discontinue: pramipexole (MIRAPEX) 0.25 MG tablet; Take 1.5 tablets (0.375 mg total) by mouth in the morning and at bedtime. 1/2 tablet twice daily for 1 week then 1 twice daily. -     pramipexole (MIRAPEX) 0.75 MG tablet; Take 0.5 tablets (0.375 mg total) by mouth in the morning and at bedtime. 1/2 tablet twice daily for 1 week then 1 twice daily.  Dx bipolar at 74 yo.  Bipolar depression is not fully managed.  He is isolated, inactive, mainly watching TV, and has reduced interest and motivation.  He is not profoundly sad.  He was making progress at the last visit and we want to have him continue to be physically active and mentally stimulated.  Emphasize this to both he and his wife.  He needs to continue exercising and specifically walking and if he does these things we may be able to discontinue the Wellbutrin in the future.    Depression under control.  No mood swings.   Continue bupropion XL 300 mg tablets each morning.  Consider buspirone, pramipexole off label, etc. Consider low dose lithium bc best response ever was with lithium.  Disc SE In detail and risk mania with pramipexole but potentially greater benefit and might be able to replace Wellbutrin.   Wife& patient has seen a big improvement with  Pramipexole 0.375 mg tablet  twice daily  Anxiety is generally managed.    Tremor is manageable.  Discussed the that excess caffeine with Wellbutrin could exacerbate it. Balance problem much improved with dosing Trileptal all in the evening 1200 mg HS.    Minimize benzodiazepine because of the possible side effect of confusion.  At this time he is taking it rarely.  We discussed the short-term risks associated with benzodiazepines including sedation and increased fall risk among others.  Discussed long-term side effect risk including dependence, potential withdrawal symptoms, and the potential eventual dose-related risk of dementia.  Discussed side effect of each medication  Follow-up 3  mso  This was a 30-minute appointment  Lynder Parents, MD, DFAPA   Please see After Visit Summary for patient specific instructions.  No future appointments.  No orders of the defined types were placed in this encounter.     -------------------------------

## 2020-02-15 NOTE — Patient Instructions (Signed)
Constipation management 1.  Lots of water 2.  Powdered fiber supplement such as MiraLAX, Citrucel, etc. preferably with a meal 3.  2 stool softeners a day 4.  Milk of magnesia or magnesium tablets if needed

## 2020-04-03 ENCOUNTER — Other Ambulatory Visit: Payer: Self-pay | Admitting: Psychiatry

## 2020-04-03 DIAGNOSIS — F3131 Bipolar disorder, current episode depressed, mild: Secondary | ICD-10-CM

## 2020-04-04 NOTE — Telephone Encounter (Signed)
review 

## 2020-04-05 DIAGNOSIS — Z1389 Encounter for screening for other disorder: Secondary | ICD-10-CM | POA: Diagnosis not present

## 2020-04-05 DIAGNOSIS — Z6829 Body mass index (BMI) 29.0-29.9, adult: Secondary | ICD-10-CM | POA: Diagnosis not present

## 2020-04-05 DIAGNOSIS — I4821 Permanent atrial fibrillation: Secondary | ICD-10-CM | POA: Diagnosis not present

## 2020-04-05 DIAGNOSIS — I10 Essential (primary) hypertension: Secondary | ICD-10-CM | POA: Diagnosis not present

## 2020-04-05 DIAGNOSIS — R7309 Other abnormal glucose: Secondary | ICD-10-CM | POA: Diagnosis not present

## 2020-04-05 DIAGNOSIS — E7849 Other hyperlipidemia: Secondary | ICD-10-CM | POA: Diagnosis not present

## 2020-04-05 DIAGNOSIS — G43909 Migraine, unspecified, not intractable, without status migrainosus: Secondary | ICD-10-CM | POA: Diagnosis not present

## 2020-04-05 DIAGNOSIS — Z0001 Encounter for general adult medical examination with abnormal findings: Secondary | ICD-10-CM | POA: Diagnosis not present

## 2020-04-13 DIAGNOSIS — I129 Hypertensive chronic kidney disease with stage 1 through stage 4 chronic kidney disease, or unspecified chronic kidney disease: Secondary | ICD-10-CM | POA: Diagnosis not present

## 2020-04-13 DIAGNOSIS — F3162 Bipolar disorder, current episode mixed, moderate: Secondary | ICD-10-CM | POA: Diagnosis not present

## 2020-04-13 DIAGNOSIS — N183 Chronic kidney disease, stage 3 unspecified: Secondary | ICD-10-CM | POA: Diagnosis not present

## 2020-04-13 DIAGNOSIS — E039 Hypothyroidism, unspecified: Secondary | ICD-10-CM | POA: Diagnosis not present

## 2020-05-13 DIAGNOSIS — F3162 Bipolar disorder, current episode mixed, moderate: Secondary | ICD-10-CM | POA: Diagnosis not present

## 2020-05-13 DIAGNOSIS — I129 Hypertensive chronic kidney disease with stage 1 through stage 4 chronic kidney disease, or unspecified chronic kidney disease: Secondary | ICD-10-CM | POA: Diagnosis not present

## 2020-05-13 DIAGNOSIS — E039 Hypothyroidism, unspecified: Secondary | ICD-10-CM | POA: Diagnosis not present

## 2020-05-13 DIAGNOSIS — N183 Chronic kidney disease, stage 3 unspecified: Secondary | ICD-10-CM | POA: Diagnosis not present

## 2020-06-13 ENCOUNTER — Encounter: Payer: Self-pay | Admitting: Cardiovascular Disease

## 2020-06-13 ENCOUNTER — Ambulatory Visit (INDEPENDENT_AMBULATORY_CARE_PROVIDER_SITE_OTHER): Payer: Medicare Other | Admitting: Cardiovascular Disease

## 2020-06-13 ENCOUNTER — Ambulatory Visit (INDEPENDENT_AMBULATORY_CARE_PROVIDER_SITE_OTHER): Payer: Medicare Other | Admitting: Psychiatry

## 2020-06-13 ENCOUNTER — Other Ambulatory Visit: Payer: Self-pay

## 2020-06-13 ENCOUNTER — Encounter: Payer: Self-pay | Admitting: Psychiatry

## 2020-06-13 VITALS — BP 160/88 | HR 114 | Ht 73.0 in | Wt 207.2 lb

## 2020-06-13 DIAGNOSIS — G4733 Obstructive sleep apnea (adult) (pediatric): Secondary | ICD-10-CM | POA: Diagnosis not present

## 2020-06-13 DIAGNOSIS — F3175 Bipolar disorder, in partial remission, most recent episode depressed: Secondary | ICD-10-CM | POA: Diagnosis not present

## 2020-06-13 DIAGNOSIS — I10 Essential (primary) hypertension: Secondary | ICD-10-CM

## 2020-06-13 DIAGNOSIS — E039 Hypothyroidism, unspecified: Secondary | ICD-10-CM | POA: Diagnosis not present

## 2020-06-13 DIAGNOSIS — E663 Overweight: Secondary | ICD-10-CM

## 2020-06-13 DIAGNOSIS — Z7901 Long term (current) use of anticoagulants: Secondary | ICD-10-CM | POA: Diagnosis not present

## 2020-06-13 DIAGNOSIS — E7800 Pure hypercholesterolemia, unspecified: Secondary | ICD-10-CM | POA: Diagnosis not present

## 2020-06-13 DIAGNOSIS — F411 Generalized anxiety disorder: Secondary | ICD-10-CM

## 2020-06-13 DIAGNOSIS — F3132 Bipolar disorder, current episode depressed, moderate: Secondary | ICD-10-CM | POA: Diagnosis not present

## 2020-06-13 DIAGNOSIS — I4821 Permanent atrial fibrillation: Secondary | ICD-10-CM

## 2020-06-13 DIAGNOSIS — F3162 Bipolar disorder, current episode mixed, moderate: Secondary | ICD-10-CM | POA: Diagnosis not present

## 2020-06-13 DIAGNOSIS — N183 Chronic kidney disease, stage 3 unspecified: Secondary | ICD-10-CM | POA: Diagnosis not present

## 2020-06-13 DIAGNOSIS — I129 Hypertensive chronic kidney disease with stage 1 through stage 4 chronic kidney disease, or unspecified chronic kidney disease: Secondary | ICD-10-CM | POA: Diagnosis not present

## 2020-06-13 DIAGNOSIS — F3131 Bipolar disorder, current episode depressed, mild: Secondary | ICD-10-CM | POA: Diagnosis not present

## 2020-06-13 MED ORDER — BUPROPION HCL ER (XL) 300 MG PO TB24: 300.0000 mg | ORAL_TABLET | Freq: Every day | ORAL | 1 refills | Status: DC

## 2020-06-13 MED ORDER — METOPROLOL SUCCINATE ER 50 MG PO TB24: 50.0000 mg | ORAL_TABLET | Freq: Every day | ORAL | 3 refills | Status: DC

## 2020-06-13 MED ORDER — PRAMIPEXOLE DIHYDROCHLORIDE 0.75 MG PO TABS: 0.3750 mg | ORAL_TABLET | Freq: Two times a day (BID) | ORAL | 1 refills | Status: DC

## 2020-06-13 MED ORDER — OXCARBAZEPINE 600 MG PO TABS: 1200.0000 mg | ORAL_TABLET | Freq: Every day | ORAL | 1 refills | Status: DC

## 2020-06-13 NOTE — Progress Notes (Signed)
Cardiology Office Note   Date:  06/13/2020   ID:  Casey [name], DOB 06/07/1946, MRN [id number]  PCP:  Sharilyn Sites, MD  Cardiologist:  Trevia Nop Electrophysiologist:  None   Evaluation Performed:  Follow-Up Visit  Chief Complaint:  AFib  History of Present Illness:    Casey [name] is a 74 y.o. male with permanent atrial fibrillation, but without other major structural cardiac abnormalities.  He has obstructive sleep apnea and longstanding struggles with bipolar disorder.  He becomes dyspneic climbing stairs. He has not had any new serious injuries, falls or bleeding. He is compliant with Xarelto, but has been taking it at bedtime, 5 hours after dinner.  He feels tired all the time.  Although he was diagnosed with sleep apnea, he has not used CPAP in years.  After losing weight, he felt that this was cured.  He was prescribed CPAP twice before and on each occasion only was able to use it for couple of months before abandoning therapy. He has not been evaluated for a dental device.  He is missing a few teeth.  A couple of years ago he had a pleural effusion following trauma.  The patient specifically denies any chest pain at rest or with exertion, dyspnea at rest or with light exertion, orthopnea, paroxysmal nocturnal dyspnea, syncope, palpitations, focal neurological deficits, intermittent claudication, lower extremity edema, unexplained weight gain, cough, hemoptysis or wheezing.   He has RVR at rest, 114 bpm.  He is taking a tiny dose of levothyroxine.  He does not have any overt clinical symptoms of hypothyroidism or hyperthyroidism.  His mood disorder is fairly well compensated at this point.  TSH was 1.38 on 04/05/2020.  His lipid profile was excellent at the same date.  His blood pressure is elevated today, but this is quite atypical for him.    Past Medical History:  Diagnosis Date  . Arthritis   . Atrial fibrillation ([place]) 01/12/2010   2D Echo EF=>55%  . Bipolar  disorder ([place])   . Chronic back pain   . Depression   . Dysrhythmia    AFib  . History of colonic polyps   . History of gout   . Hyperlipidemia   . Hypertension   . Hypothyroidism   . Morbid obesity ([place])   . OSA (obstructive sleep apnea)    AHI was 27.62hr RDI was 34.3 hr REM 0.00hr; had CPAP years ago but no longer uses.  . Prostatitis   . Tubular adenoma of colon 01/2016   Past Surgical History:  Procedure Laterality Date  . APPENDECTOMY  1959  . asthma    . CATARACT EXTRACTION Bilateral 1995  . COLONOSCOPY  2011   RMR: left-sided diverticula, minimal rectal friability. No polyps. Repeat 5 years.  . COLONOSCOPY WITH PROPOFOL N/A 01/15/2016   Dr.Rourk- diverticulosis in the entire examined colon, multiple rectal and colonic polyps, internal hemorrhoids. bx= tubular adenomas and hyperplastic polyps.   Marland Kitchen HAND / FINGER LESION EXCISION Right   . IR THORACENTESIS ASP PLEURAL SPACE W/IMG GUIDE  03/01/2019  . IR THORACENTESIS ASP PLEURAL SPACE W/IMG GUIDE  03/31/2019  . POLYPECTOMY  01/15/2016   Procedure: POLYPECTOMY;  Surgeon: Daneil Dolin, MD;  Location: [facility];  Service: Endoscopy;;  . right shoulder surgery    . TONSILLECTOMY  1955     Current Meds  Medication Sig  . acetaminophen (TYLENOL) 500 MG tablet Take 1,000 mg by mouth every 6 (six) hours as needed for headache.  Marland Kitchen  Albuterol Sulfate 108 (90 Base) MCG/ACT AEPB Inhale 1-2 puffs into the lungs as needed.   . bisacodyl (DULCOLAX) 5 MG EC tablet Take 5-15 mg by mouth daily as needed for moderate constipation.  . cholecalciferol (VITAMIN D) 1000 UNITS tablet Take 2,000 Units by mouth daily.  . colchicine 0.6 MG tablet   . fluticasone (FLONASE) 50 MCG/ACT nasal spray Place 2 sprays into both nostrils daily.  . Ginkgo Biloba 40 MG TABS Take 60 mg by mouth 2 (two) times daily.  Marland Kitchen levothyroxine (SYNTHROID, LEVOTHROID) 25 MCG tablet Take 25 mcg by mouth daily before breakfast.   . linaclotide (LINZESS) 72 MCG capsule Take  1 capsule (72 mcg total) by mouth daily before breakfast.  . pantoprazole (PROTONIX) 40 MG tablet TAKE ONE TABLET BY MOUTH ONCE DAILY  . rivaroxaban (XARELTO) 20 MG TABS tablet Take 20 mg by mouth daily after supper.   . SUPER B COMPLEX/C PO Take by mouth.  . Wheat Dextrin (BENEFIBER PO) Take 1 Dose by mouth as needed (constapation).   . [DISCONTINUED] buPROPion (WELLBUTRIN XL) 300 MG 24 hr tablet Take 1 tablet by mouth once daily  . [DISCONTINUED] oxcarbazepine (TRILEPTAL) 600 MG tablet Take 2 tablets (1,200 mg total) by mouth at bedtime.  . [DISCONTINUED] pramipexole (MIRAPEX) 0.75 MG tablet Take 0.5 tablets (0.375 mg total) by mouth in the morning and at bedtime. 1/2 tablet twice daily for 1 week then 1 twice daily.     Allergies:   Amlodipine and Vicodin [hydrocodone-acetaminophen]   Social History   Tobacco Use  . Smoking status: Never Smoker  . Smokeless tobacco: Never Used  Substance Use Topics  . Alcohol use: No    Alcohol/week: 0.0 standard drinks  . Drug use: No     Family Hx: The patient's family history includes Arthritis in his brother; Cancer in his paternal grandmother; Diabetes in his mother; Heart failure in his mother; Hypertension in his father and mother; Liver disease in his brother; Stroke in his sister; Vascular Disease in his brother. There is no history of Colon cancer.  ROS:   Please see the history of present illness.     All other systems reviewed and are negative.   Prior CV studies:   The following studies were reviewed today:   Labs/Other Tests and Data Reviewed:    EKG:  Ordered today, shows atrial fibrillation with RVR, otw normal.  Recent Labs: No results found for requested labs within last 8760 hours.   Recent Lipid Panel No results found for: CHOL, TRIG, HDL, CHOLHDL, LDLCALC, LDLDIRECT  01/28/2019 Total cholesterol 183, HDL 60, LDL 111, triglycerides 58  Wt Readings from Last 3 Encounters:  06/13/20 207 lb 3.2 oz (94 kg)    06/09/19 205 lb (93 kg)  03/24/19 210 lb 9.6 oz (95.5 kg)     Objective:    Vital Signs:  BP (!) 160/88   Pulse (!) 114   Ht 6\' 1"  (1.854 m)   Wt 207 lb 3.2 oz (94 kg)   BMI 27.34 kg/m     General: Alert, oriented x3, no distress, overweight Head: no evidence of trauma, PERRL, EOMI, no exophtalmos or lid lag, no myxedema, no xanthelasma; normal ears, nose and oropharynx Neck: normal jugular venous pulsations and no hepatojugular reflux; brisk carotid pulses without delay and no carotid bruits Chest: clear to auscultation, no signs of consolidation by percussion or palpation, normal fremitus, symmetrical and full respiratory excursions Cardiovascular: normal position and quality of the apical impulse, irregular  rhythm, normal first and second heart sounds, no murmurs, rubs or gallops Abdomen: no tenderness or distention, no masses by palpation, no abnormal pulsatility or arterial bruits, normal bowel sounds, no hepatosplenomegaly Extremities: no clubbing, cyanosis or edema; 2+ radial, ulnar and brachial pulses bilaterally; 2+ right femoral, posterior tibial and dorsalis pedis pulses; 2+ left femoral, posterior tibial and dorsalis pedis pulses; no subclavian or femoral bruits Neurological: grossly nonfocal Psych: Normal mood and affect   ASSESSMENT & PLAN:    1. Permanent atrial fibrillation ([place])   2. Long term current use of anticoagulant   3. OSA (obstructive sleep apnea)   4. Bipolar disorder, in partial remission, most recent episode depressed ([place])   5. Overweight   6. Hypercholesterolemia   7. Essential hypertension      1. AFib: Permanent arrhythmia.  Rate control is poor and he is not on any medications for this.  We will start metoprolol succinate 25 mg once daily, with a plan to increase it to 50 mg once daily if well-tolerated after a week.  The potential interaction with Wellbutrin is doing noted (increase metoprolol metabolism due to CYP inhibition), but I actually  suspect that he would otherwise need even higher doses of metoprolol for good rate control.  CHA2DS2-VASc 1-2 (age, HTN). 2. Xarelto: Posttraumatic pleural effusion a couple of years ago, but otherwise no other falls, injuries or bleeding complications. 3. OSA: May be part of his complaints of fatigue, together with the tachyarrhythmia and depression. 4. Bipolar disorder: Seems to be reasonably well compensated. 5. Overweight: He is quite lean compared with prior visits, which usually suggest that he is still in depressed mood.  His wife states that he is "75% back to normal". 6. Hypercholesterolemia: All lipid parameters in desirable range. 7. HTN: Blood pressure is unusually high for him today.  We will see what it is after he takes metoprolol.  Asked his wife to share with [place] a log of his heart rate and blood pressure in a couple of weeks.   Patient Instructions  Medication Instructions:  START Metoprolol Succinate 50 mg once daily. For the first 7 days, take half a tablet then increase it to a whole tablet.  Xarelto 20 mg samples given, 3 bottles  *If you need a refill on your cardiac medications before your next appointment, please call your pharmacy*   Lab Work: None ordered If you have labs (blood work) drawn today and your tests are completely normal, you will receive your results only by: Marland Kitchen MyChart Message (if you have MyChart) OR . A paper copy in the mail If you have any lab test that is abnormal or we need to change your treatment, we will call you to review the results.   Testing/Procedures: Non eordered   Follow-Up: At [facility], you and your health needs are our priority.  As part of our continuing mission to provide you with exceptional heart care, we have created designated Provider Care Teams.  These Care Teams include your primary Cardiologist (physician) and Advanced Practice Providers (APPs -  Physician Assistants and Nurse Practitioners) who all work together  to provide you with the care you need, when you need it.  We recommend signing up for the patient portal called "MyChart".  Sign up information is provided on this After Visit Summary.  MyChart is used to connect with patients for Virtual Visits (Telemedicine).  Patients are able to view lab/test results, encounter notes, upcoming appointments, etc.  Non-urgent messages can be sent  to your provider as well.   To learn more about what you can do with MyChart, go to [email or web address].    Your next appointment:   12 month(s)  The format for your next appointment:   In Person  Provider:   You may see Sanda Klein, MD or one of the following Advanced Practice Providers on your designated Care Team:    Almyra Deforest, PA-C  Fabian Sharp, [place] or   Roby Lofts, [place]    Other Instructions Dr. Sallyanne Kuster would like you to check your blood pressure and heart rate daily for the next 2 weeks.  Keep a journal of these daily blood pressure and heart rate readings and call our office or send a message through [place] with the results. Thank you!  It is best to check your BP 1-2 hours after taking your medications to see the medications effectiveness on your BP.    Here are some tips that our clinical pharmacists share for home BP monitoring:          Rest 5 minutes before taking your blood pressure.          Don't smoke or drink caffeinated beverages for at least 30 minutes before.          Take your blood pressure before (not after) you eat.          Sit comfortably with your back supported and both feet on the floor (don't cross your legs).          Elevate your arm to heart level on a table or a desk.          Use the proper sized cuff. It should fit smoothly and snugly around your bare upper arm. There should be enough room to slip a fingertip under the cuff. The bottom edge of the cuff should be 1 inch above the crease of the elbow.      Signed, Sanda Klein, MD  06/13/2020  12:06 PM    [place]

## 2020-06-13 NOTE — Patient Instructions (Addendum)
Medication Instructions:  START Metoprolol Succinate 50 mg once daily. For the first 7 days, take half a tablet then increase it to a whole tablet.  Xarelto 20 mg samples given, 3 bottles  *If you need a refill on your cardiac medications before your next appointment, please call your pharmacy*   Lab Work: None ordered If you have labs (blood work) drawn today and your tests are completely normal, you will receive your results only by: Marland Kitchen MyChart Message (if you have MyChart) OR . A paper copy in the mail If you have any lab test that is abnormal or we need to change your treatment, we will call you to review the results.   Testing/Procedures: Non eordered   Follow-Up: At [facility], you and your health needs are our priority.  As part of our continuing mission to provide you with exceptional heart care, we have created designated Provider Care Teams.  These Care Teams include your primary Cardiologist (physician) and Advanced Practice Providers (APPs -  Physician Assistants and Nurse Practitioners) who all work together to provide you with the care you need, when you need it.  We recommend signing up for the patient portal called "MyChart".  Sign up information is provided on this After Visit Summary.  MyChart is used to connect with patients for Virtual Visits (Telemedicine).  Patients are able to view lab/test results, encounter notes, upcoming appointments, etc.  Non-urgent messages can be sent to your provider as well.   To learn more about what you can do with MyChart, go to [email or web address].    Your next appointment:   12 month(s)  The format for your next appointment:   In Person  Provider:   You may see Sanda Klein, MD or one of the following Advanced Practice Providers on your designated Care Team:    Almyra Deforest, PA-C  Fabian Sharp, [place] or   Roby Lofts, [place]    Other Instructions Dr. Sallyanne Kuster would like you to check your blood pressure and heart  rate daily for the next 2 weeks.  Keep a journal of these daily blood pressure and heart rate readings and call our office or send a message through [place] with the results. Thank you!  It is best to check your BP 1-2 hours after taking your medications to see the medications effectiveness on your BP.    Here are some tips that our clinical pharmacists share for home BP monitoring:          Rest 5 minutes before taking your blood pressure.          Don't smoke or drink caffeinated beverages for at least 30 minutes before.          Take your blood pressure before (not after) you eat.          Sit comfortably with your back supported and both feet on the floor (don't cross your legs).          Elevate your arm to heart level on a table or a desk.          Use the proper sized cuff. It should fit smoothly and snugly around your bare upper arm. There should be enough room to slip a fingertip under the cuff. The bottom edge of the cuff should be 1 inch above the crease of the elbow.

## 2020-06-13 NOTE — Progress Notes (Signed)
Casey [name] [id number] 1945/07/18 74 y.o.    Subjective:   Patient ID:  Casey [name] is a 74 y.o. (DOB 08-15-1945) male.  Chief Complaint:  Chief Complaint  Patient presents with  . Follow-up    Mood anxiety and meds    HPI seen with wife Arnet Hofferber presents for follow-up of bipolar disorder and anxiety and history of confusion  At visit  Dec 02, 2018.  He was having confusion and benztropine was stopped to see if that was the cause.  Also it was having balance issues and therefore Trileptal was switched all to nighttime 1200 mg nightly. Confusion much better per both of them with DC benztropine.  Balance better with change Trileptal.   visit was January 13, 2019 and he was doing well as noted above and he was encouraged not to make further med changes at that time.  visit April 13, 2019.  He was doing better with regard to depression than he had in quite some time on the Wellbutrin and no meds were changed.  He was having health problems with pulmonary effusion and fluid removed twice related to fall in January 2020.  Last seen September 13, 2019.  For residual depression the following decisions were made: Balance problem much improved with dosing Trileptal all in the evening. Depression under partial control & wife wants it to be better per his wife.  No mood swings.   Increase bupropion XL to 2 of the 150 mg tablets in the morning until the current bottle is gone. Then new RX will be 1 of the 300 mg tablets each morning.  November 08, 2019 appointment the following is noted:  Seen with wife, [name]. Small amount of improvement with change.  Anxiety got worse.  Notices when he goes out.  Worries about social and political things.  Wife keeps him active at home.  Allerigies bother him..  No sig caffeine except tea when goes out.  Wife sees him as a little more active and initiative.  More productive.  Started some cooking.  Getting out and up better.  A little more talkative.    "He wants to be hyper but we know what happens". Wife [name] thinks   But for the last 2 years he's done nothing and laid around all the time.  He's a lot more active now. Big improvement. Went to church first time in 2 years. Plan:  For TRD, Pramipexole 0.25 mg tablet 1/2 twice daily for 1 week then 1 twice daily.  12/20/2019 appointment with the following noted:  Seen with wife. A little better motivated to get out but not to get in crowds.  Wife agrees more motivated and went to church once alone without pressure.  Wife says he's more talkative and much  Better interest.  Still not motivated to do things like mow the grass.  Others's have seen a difference. Concentration and memory are improved but not normal. No Se pramipexole.  Getting out of bed earlier and better now.. Wife admisters meds bc earlier he messsed them up. Plan: Wife has seen a big improvement therefore Partial improvement is clear from this so increase Pramipexole 0.375 mg tablet  twice daily  Anxiety is not fully managed.  Partly related to depression. Tremor is manageable.    02/15/20 appt with the following noted:  Send with wife [name] Doing good.  Increased pramipexole as indicated.  Tolerating meds Wellbutrin XL 300 mg AM & Trileptal 1200 mg HS with  it. Much better with depression.  Stays up more and speinding less time in bed.   Wife agrees he's much better and handling things better. 2 1/2 yo 4th cousin girl drowned in pond lately.  Big family. Wife pleased he handled it better. SE constipation ? Related. History Linzess but diarrhea. He's not highly motivated but it is better.  Depression is much improved.  Had been depressed for years. Plan no med changes  06/13/2020 appointment with the following noted: Pretty good but cardiologist noted today increased pulse needing treatment.  Uncontrolled afib even after ablation. Hard to tell about depression bc of fatigue Dt heart problems. Wife said he did well  Thanksgiving and participated more in conversation per wife.  Patient reports stable mood and denies irritable moods.   Gets out more now. No sig depression and low motivation and low confidence.   Patient denies difficulty with sleep initiation or maintenance, 8 hours. Denies appetite disturbance.  Patient reports that energy and motivation have been good.  Patient has difficulty with concentration and memory.  Patient denies any suicidal ideation.   Past Psychiatric Medication Trials: Seroquel 300, olanzapine, perphenazine, risperidone,  Lithium helped but SE kidneys and prostate,  Latuda,  Abilify,  alprazolam,  Depakote, Trileptal, carbamazepine, lamotrigine caused nausea, citalopram, sertraline 150,   Wellbutrin 300 anxiety,  Pramipexole 0.375 BID Benztropine confusion        1 prior psych hosp depression                                                    Review of Systems:  Review of Systems  Constitutional: Positive for fatigue.  Eyes: Positive for visual disturbance.  Gastrointestinal: Positive for constipation.  Skin: Negative for rash.       chronic  Neurological: Negative for tremors and weakness.          Psychiatric/Behavioral: Negative for agitation, behavioral problems, confusion, decreased concentration, dysphoric mood, hallucinations, self-injury, sleep disturbance and suicidal ideas. The patient is not nervous/anxious and is not hyperactive.     Medications: I have reviewed the patient's current medications.  Current Outpatient Medications  Medication Sig Dispense Refill  . acetaminophen (TYLENOL) 500 MG tablet Take 1,000 mg by mouth every 6 (six) hours as needed for headache.    . Albuterol Sulfate 108 (90 Base) MCG/ACT AEPB Inhale 1-2 puffs into the lungs as needed.     . bisacodyl (DULCOLAX) 5 MG EC tablet Take 5-15 mg by mouth daily as needed for moderate constipation.    Marland Kitchen buPROPion (WELLBUTRIN XL) 300 MG 24 hr tablet Take 1 tablet by mouth once daily 90 tablet  0  . cholecalciferol (VITAMIN D) 1000 UNITS tablet Take 2,000 Units by mouth daily.    . colchicine 0.6 MG tablet     . fluticasone (FLONASE) 50 MCG/ACT nasal spray Place 2 sprays into both nostrils daily.  11  . Ginkgo Biloba 40 MG TABS Take 60 mg by mouth 2 (two) times daily.    Marland Kitchen levothyroxine (SYNTHROID, LEVOTHROID) 25 MCG tablet Take 25 mcg by mouth daily before breakfast.     . linaclotide (LINZESS) 72 MCG capsule Take 1 capsule (72 mcg total) by mouth daily before breakfast. 30 capsule 2  . metoprolol succinate (TOPROL-XL) 50 MG 24 hr tablet Take 1 tablet (50 mg total) by mouth daily. Take with  or immediately following a meal. 90 tablet 3  . oxcarbazepine (TRILEPTAL) 600 MG tablet Take 2 tablets (1,200 mg total) by mouth at bedtime. 180 tablet 1  . pantoprazole (PROTONIX) 40 MG tablet TAKE ONE TABLET BY MOUTH ONCE DAILY 30 tablet 11  . pramipexole (MIRAPEX) 0.75 MG tablet Take 0.5 tablets (0.375 mg total) by mouth in the morning and at bedtime. 1/2 tablet twice daily for 1 week then 1 twice daily. 90 tablet 1  . rivaroxaban (XARELTO) 20 MG TABS tablet Take 20 mg by mouth daily after supper.     . SUPER B COMPLEX/C PO Take by mouth.    . Wheat Dextrin (BENEFIBER PO) Take 1 Dose by mouth as needed (constapation).      No current facility-administered medications for this visit.    Medication Side Effects: resolved  Allergies:  Allergies  Allergen Reactions  . Amlodipine Other (See Comments)    Stopped due to kidney issues  . Vicodin [Hydrocodone-Acetaminophen] Other (See Comments)    Patient is unsure of reaction    Past Medical History:  Diagnosis Date  . Arthritis   . Atrial fibrillation ([place]) 01/12/2010   2D Echo EF=>55%  . Bipolar disorder ([place])   . Chronic back pain   . Depression   . Dysrhythmia    AFib  . History of colonic polyps   . History of gout   . Hyperlipidemia   . Hypertension   . Hypothyroidism   . Morbid obesity ([place])   . OSA (obstructive sleep  apnea)    AHI was 27.62hr RDI was 34.3 hr REM 0.00hr; had CPAP years ago but no longer uses.  . Prostatitis   . Tubular adenoma of colon 01/2016    Family History  Problem Relation Age of Onset  . Diabetes Mother   . Heart failure Mother   . Hypertension Mother   . Hypertension Father   . Cancer Paternal Grandmother   . Stroke Sister   . Liver disease Brother   . Vascular Disease Brother   . Arthritis Brother   . Colon cancer Neg Hx     Social History   Socioeconomic History  . Marital status: Married    Spouse name: Not on file  . Number of children: Not on file  . Years of education: Not on file  . Highest education level: Not on file  Occupational History  . Not on file  Tobacco Use  . Smoking status: Never Smoker  . Smokeless tobacco: Never Used  Substance and Sexual Activity  . Alcohol use: No    Alcohol/week: 0.0 standard drinks  . Drug use: No  . Sexual activity: Yes    Birth control/protection: None  Other Topics Concern  . Not on file  Social History Narrative  . Not on file   Social Determinants of Health   Financial Resource Strain:   . Difficulty of Paying Living Expenses: Not on file  Food Insecurity:   . Worried About Charity fundraiser in the Last Year: Not on file  . Ran Out of Food in the Last Year: Not on file  Transportation Needs:   . Lack of Transportation (Medical): Not on file  . Lack of Transportation (Non-Medical): Not on file  Physical Activity:   . Days of Exercise per Week: Not on file  . Minutes of Exercise per Session: Not on file  Stress:   . Feeling of Stress : Not on file  Social Connections:   .  Frequency of Communication with Friends and Family: Not on file  . Frequency of Social Gatherings with Friends and Family: Not on file  . Attends Religious Services: Not on file  . Active Member of Clubs or Organizations: Not on file  . Attends Archivist Meetings: Not on file  . Marital Status: Not on file   Intimate Partner Violence:   . Fear of Current or Ex-Partner: Not on file  . Emotionally Abused: Not on file  . Physically Abused: Not on file  . Sexually Abused: Not on file    Past Medical History, Surgical history, Social history, and Family history were reviewed and updated as appropriate.   Please see review of systems for further details on the patient's review from today.   Objective:   Physical Exam:  There were no vitals taken for this visit.  Physical Exam Constitutional:      General: He is not in acute distress.    Appearance: He is well-developed.  Musculoskeletal:        General: No deformity.  Neurological:     Mental Status: He is alert and oriented to person, place, and time.     Coordination: Coordination normal.  Psychiatric:        Attention and Perception: He is attentive. He does not perceive auditory hallucinations.        Mood and Affect: Mood is anxious. Mood is not depressed. Affect is not labile, blunt, angry or inappropriate.        Speech: Speech normal.        Behavior: Behavior normal.        Thought Content: Thought content normal. Thought content does not include homicidal or suicidal ideation. Thought content does not include homicidal or suicidal plan.        Cognition and Memory: Cognition normal.        Judgment: Judgment normal.     Comments: Insight intact. No auditory or visual hallucinations. No delusions.  Better cogninition and mood. Residual depression but better much.     Lab Review:     Component Value Date/Time   NA 141 12/28/2018 0941   K 4.0 12/28/2018 0941   CL 104 12/28/2018 0941   CO2 22 12/28/2018 0941   GLUCOSE 88 12/28/2018 0941   GLUCOSE 111 (H) 06/30/2018 1311   BUN 16 12/28/2018 0941   CREATININE 1.30 (H) 12/28/2018 0941   CALCIUM 9.4 12/28/2018 0941   PROT 6.6 12/28/2018 0941   ALBUMIN 4.5 12/28/2018 0941   AST 21 12/28/2018 0941   ALT 22 12/28/2018 0941   ALKPHOS 115 12/28/2018 0941   BILITOT 0.7  12/28/2018 0941   GFRNONAA 55 (L) 12/28/2018 0941   GFRAA 63 12/28/2018 0941       Component Value Date/Time   WBC 8.1 12/28/2018 0941   WBC 5.4 06/30/2018 1311   RBC 4.81 12/28/2018 0941   RBC 4.52 06/30/2018 1311   HGB 15.2 12/28/2018 0941   HCT 42.9 12/28/2018 0941   PLT 186 12/28/2018 0941   MCV 89 12/28/2018 0941   MCH 31.6 12/28/2018 0941   MCH 31.9 06/30/2018 1311   MCHC 35.4 12/28/2018 0941   MCHC 32.8 06/30/2018 1311   RDW 12.9 12/28/2018 0941   LYMPHSABS 1.1 12/28/2018 0941   MONOABS 0.5 06/30/2018 1311   EOSABS 0.1 12/28/2018 0941   BASOSABS 0.0 12/28/2018 0941    No results found for: POCLITH, LITHIUM   No results found for: PHENYTOIN, PHENOBARB, VALPROATE, CBMZ   .  res Assessment: Plan:    Jashad was seen today for follow-up.  Diagnoses and all orders for this visit:  Bipolar affective disorder, currently depressed, moderate (HCC)  Generalized anxiety disorder  Dx bipolar at 74 yo.  Bipolar depression is not fully managed.  He is isolated, inactive, mainly watching TV, and has reduced interest and motivation.  He is not profoundly sad.  He was making progress at the last visit and we want to have him continue to be physically active and mentally stimulated.  Emphasize this to both he and his wife.  He needs to continue exercising and specifically walking and if he does these things we may be able to discontinue the Wellbutrin in the future.   Concentration is better some.  History pseudodementia with depression which is largely resolved.  Depression under control.  No mood swings.   Continue bupropion XL 300 mg tablets each morning. No higher DT anxiety risk and no stimulants DT afib.  Consider buspirone, pramipexole off label, etc. Consider low dose lithium bc best response ever was with lithium.  Disc SE In detail and risk mania with pramipexole but potentially greater benefit and might be able to replace Wellbutrin.   Wife& patient has seen a big  improvement with Pramipexole 0.375 mg tablet  twice daily They ask consideration be given to the following: If his energy gets better by treating his tachycardia they would like to consider reducing the pramipexole or possibly eliminating it at follow-up.  His cardiologist started metoprolol today so it would be premature to make any med changes today.  Anxiety is generally managed.    Tremor is manageable.  Discussed the that excess caffeine with Wellbutrin could exacerbate it. Balance problem much improved with dosing Trileptal all in the evening 1200 mg HS.    Minimize benzodiazepine because of the possible side effect of confusion.  At this time he is taking it rarely.  We discussed the short-term risks associated with benzodiazepines including sedation and increased fall risk among others.  Discussed long-term side effect risk including dependence, potential withdrawal symptoms, and the potential eventual dose-related risk of dementia.  Discussed side effect of each medication  No med changes today.  Follow-up 3-4 mos  This was a 30-minute appointment  Lynder Parents, MD, DFAPA   Please see After Visit Summary for patient specific instructions.  No future appointments.  No orders of the defined types were placed in this encounter.     -------------------------------

## 2020-08-07 DIAGNOSIS — Z6829 Body mass index (BMI) 29.0-29.9, adult: Secondary | ICD-10-CM | POA: Diagnosis not present

## 2020-08-07 DIAGNOSIS — L570 Actinic keratosis: Secondary | ICD-10-CM | POA: Diagnosis not present

## 2020-08-07 DIAGNOSIS — E663 Overweight: Secondary | ICD-10-CM | POA: Diagnosis not present

## 2020-08-12 DIAGNOSIS — I129 Hypertensive chronic kidney disease with stage 1 through stage 4 chronic kidney disease, or unspecified chronic kidney disease: Secondary | ICD-10-CM | POA: Diagnosis not present

## 2020-08-12 DIAGNOSIS — F3162 Bipolar disorder, current episode mixed, moderate: Secondary | ICD-10-CM | POA: Diagnosis not present

## 2020-08-12 DIAGNOSIS — E039 Hypothyroidism, unspecified: Secondary | ICD-10-CM | POA: Diagnosis not present

## 2020-08-12 DIAGNOSIS — N183 Chronic kidney disease, stage 3 unspecified: Secondary | ICD-10-CM | POA: Diagnosis not present

## 2020-08-31 DIAGNOSIS — E663 Overweight: Secondary | ICD-10-CM | POA: Diagnosis not present

## 2020-08-31 DIAGNOSIS — Z6829 Body mass index (BMI) 29.0-29.9, adult: Secondary | ICD-10-CM | POA: Diagnosis not present

## 2020-08-31 DIAGNOSIS — H6993 Unspecified Eustachian tube disorder, bilateral: Secondary | ICD-10-CM | POA: Diagnosis not present

## 2020-08-31 DIAGNOSIS — J019 Acute sinusitis, unspecified: Secondary | ICD-10-CM | POA: Diagnosis not present

## 2020-09-06 ENCOUNTER — Encounter: Payer: Self-pay | Admitting: Psychiatry

## 2020-09-06 ENCOUNTER — Ambulatory Visit (INDEPENDENT_AMBULATORY_CARE_PROVIDER_SITE_OTHER): Payer: Medicare Other | Admitting: Psychiatry

## 2020-09-06 ENCOUNTER — Other Ambulatory Visit: Payer: Self-pay

## 2020-09-06 DIAGNOSIS — F411 Generalized anxiety disorder: Secondary | ICD-10-CM | POA: Diagnosis not present

## 2020-09-06 DIAGNOSIS — G251 Drug-induced tremor: Secondary | ICD-10-CM

## 2020-09-06 DIAGNOSIS — R410 Disorientation, unspecified: Secondary | ICD-10-CM

## 2020-09-06 DIAGNOSIS — F3131 Bipolar disorder, current episode depressed, mild: Secondary | ICD-10-CM

## 2020-09-06 MED ORDER — PRAMIPEXOLE DIHYDROCHLORIDE 0.25 MG PO TABS: 0.2500 mg | ORAL_TABLET | Freq: Two times a day (BID) | ORAL | 1 refills | Status: DC

## 2020-09-06 NOTE — Progress Notes (Signed)
Casey [name] [id number] Sep 07, 1945 75 y.o.    Subjective:   Patient ID:  Casey [name] is a 75 y.o. (DOB 1946-04-23) male.  Chief Complaint:  Chief Complaint  Patient presents with  . Follow-up  . Bipolar affective disorder, currently depressed, moderate (  . Other    History of altered mental status    HPI seen with wife Casey [name] presents for follow-up of bipolar disorder and anxiety and history of confusion  At visit  Dec 02, 2018.  He was having confusion and benztropine was stopped to see if that was the cause.  Also it was having balance issues and therefore Trileptal was switched all to nighttime 1200 mg nightly. Confusion much better per both of them with DC benztropine.  Balance better with change Trileptal.   visit was January 13, 2019 and he was doing well as noted above and he was encouraged not to make further med changes at that time.  visit April 13, 2019.  He was doing better with regard to depression than he had in quite some time on the Wellbutrin and no meds were changed.  He was having health problems with pulmonary effusion and fluid removed twice related to fall in January 2020.  Last seen September 13, 2019.  For residual depression the following decisions were made: Balance problem much improved with dosing Trileptal all in the evening. Depression under partial control & wife wants it to be better per his wife.  No mood swings.   Increase bupropion XL to 2 of the 150 mg tablets in the morning until the current bottle is gone. Then new RX will be 1 of the 300 mg tablets each morning.  November 08, 2019 appointment the following is noted:  Seen with wife, Casey [name]. Small amount of improvement with change.  Anxiety got worse.  Notices when he goes out.  Worries about social and political things.  Wife keeps him active at home.  Allerigies bother him..  No sig caffeine except tea when goes out.  Wife sees him as a little more active and initiative.  More  productive.  Started some cooking.  Getting out and up better.  A little more talkative.   "He wants to be hyper but we know what happens". Wife Casey [name] thinks   But for the last 2 years he's done nothing and laid around all the time.  He's a lot more active now. Big improvement. Went to church first time in 2 years. Plan:  For TRD, Pramipexole 0.25 mg tablet 1/2 twice daily for 1 week then 1 twice daily.  12/20/2019 appointment with the following noted:  Seen with wife. A little better motivated to get out but not to get in crowds.  Wife agrees more motivated and went to church once alone without pressure.  Wife says he's more talkative and much  Better interest.  Still not motivated to do things like mow the grass.  Others's have seen a difference. Concentration and memory are improved but not normal. No Se pramipexole.  Getting out of bed earlier and better now.. Wife admisters meds bc earlier he messsed them up. Plan: Wife has seen a big improvement therefore Partial improvement is clear from this so increase Pramipexole 0.375 mg tablet  twice daily  Anxiety is not fully managed.  Partly related to depression. Tremor is manageable.    02/15/20 appt with the following noted:  Send with wife Casey [name] Doing good.  Increased pramipexole as indicated.  Tolerating meds Wellbutrin XL 300 mg AM & Trileptal 1200 mg HS with it. Much better with depression.  Stays up more and speinding less time in bed.   Wife agrees he's much better and handling things better. 2 1/2 yo 4th cousin girl drowned in pond lately.  Big family. Wife pleased he handled it better. SE constipation ? Related. History Linzess but diarrhea. He's not highly motivated but it is better.  Depression is much improved.  Had been depressed for years. Plan no med changes  06/13/2020 appointment with the following noted: Pretty good but cardiologist noted today increased pulse needing treatment.  Uncontrolled afib even after ablation. Hard to  tell about depression bc of fatigue Dt heart problems. Wife said he did well Thanksgiving and participated more in conversation per wife. Plan: no med changes  09/06/2020 appointment with following noted: Remains on Wellbutrin XL 300 mg every morning, ginkgo biloba 40 mg twice daily, oxcarbazepine 1200 mg nightly, pramipexole 0.75 mg twice daily. Less sig health concerns. He thinks mood has been pretty good.  Consistent with meds. Wife thinks he stays tired and sleepy and wonders if can give him a stimulant.   Sleep good.  Will nap.  Doesn't bother him.  Walks dog 20 min in AM.  Not much initiative per wife.  Not depressed but when depressed he stayed in the bed lasting a couple of years.  History of OSA but lost weight and doesn't think he has apnea anymore.  No CPAP. Chronic afib. Not much caffeine.  Patient reports stable mood and denies irritable moods.   Gets out more now. No sig depression and low motivation and low confidence.   Patient denies difficulty with sleep initiation or maintenance, 9 hours. Denies appetite disturbance.  Patient reports that energy and motivation have been reduced.  Patient has difficulty with concentration and memory.  Patient denies any suicidal ideation.   Past Psychiatric Medication Trials: Seroquel 300, olanzapine, perphenazine, risperidone,  Lithium helped but SE kidneys and prostate,  Latuda,  Abilify,  alprazolam,  Depakote, Trileptal, carbamazepine, lamotrigine caused nausea, citalopram, sertraline 150,   Wellbutrin 300 anxiety,  Pramipexole 0.375 BID Benztropine confusion        1 prior psych hosp depression                                                    Review of Systems:  Review of Systems  Constitutional: Positive for fatigue.  Eyes: Positive for visual disturbance.  Gastrointestinal: Positive for constipation.  Skin: Negative for rash.       chronic  Neurological: Negative for tremors and weakness.          Psychiatric/Behavioral:  Negative for agitation, behavioral problems, confusion, decreased concentration, dysphoric mood, hallucinations, self-injury, sleep disturbance and suicidal ideas. The patient is not nervous/anxious and is not hyperactive.     Medications: I have reviewed the patient's current medications.  Current Outpatient Medications  Medication Sig Dispense Refill  . acetaminophen (TYLENOL) 500 MG tablet Take 1,000 mg by mouth every 6 (six) hours as needed for headache.    . Albuterol Sulfate 108 (90 Base) MCG/ACT AEPB Inhale 1-2 puffs into the lungs as needed.     . bisacodyl (DULCOLAX) 5 MG EC tablet Take 5-15 mg by mouth daily as needed for moderate constipation.    Marland Kitchen buPROPion (  WELLBUTRIN XL) 300 MG 24 hr tablet Take 1 tablet (300 mg total) by mouth daily. 90 tablet 1  . cholecalciferol (VITAMIN D) 1000 UNITS tablet Take 2,000 Units by mouth daily.    . fluticasone (FLONASE) 50 MCG/ACT nasal spray Place 2 sprays into both nostrils daily.  11  . Ginkgo Biloba 40 MG TABS Take 40 mg by mouth 2 (two) times daily.    Marland Kitchen levothyroxine (SYNTHROID, LEVOTHROID) 25 MCG tablet Take 25 mcg by mouth daily before breakfast.     . oxcarbazepine (TRILEPTAL) 600 MG tablet Take 2 tablets (1,200 mg total) by mouth at bedtime. 180 tablet 1  . pantoprazole (PROTONIX) 40 MG tablet TAKE ONE TABLET BY MOUTH ONCE DAILY 30 tablet 11  . pramipexole (MIRAPEX) 0.25 MG tablet Take 1 tablet (0.25 mg total) by mouth in the morning and at bedtime. 60 tablet 1  . rivaroxaban (XARELTO) 20 MG TABS tablet Take 20 mg by mouth daily after supper.     . SUPER B COMPLEX/C PO Take by mouth.    . Wheat Dextrin (BENEFIBER PO) Take 1 Dose by mouth as needed (constapation).     . colchicine 0.6 MG tablet  (Patient not taking: Reported on 09/06/2020)    . linaclotide (LINZESS) 72 MCG capsule Take 1 capsule (72 mcg total) by mouth daily before breakfast. (Patient not taking: Reported on 09/06/2020) 30 capsule 2  . metoprolol succinate (TOPROL-XL) 50  MG 24 hr tablet Take 1 tablet (50 mg total) by mouth daily. Take with or immediately following a meal. (Patient not taking: Reported on 09/06/2020) 90 tablet 3   No current facility-administered medications for this visit.    Medication Side Effects: resolved  Allergies:  Allergies  Allergen Reactions  . Amlodipine Other (See Comments)    Stopped due to kidney issues  . Vicodin [Hydrocodone-Acetaminophen] Other (See Comments)    Patient is unsure of reaction    Past Medical History:  Diagnosis Date  . Arthritis   . Atrial fibrillation ([place]) 01/12/2010   2D Echo EF=>55%  . Bipolar disorder ([place])   . Chronic back pain   . Depression   . Dysrhythmia    AFib  . History of colonic polyps   . History of gout   . Hyperlipidemia   . Hypertension   . Hypothyroidism   . Morbid obesity ([place])   . OSA (obstructive sleep apnea)    AHI was 27.62hr RDI was 34.3 hr REM 0.00hr; had CPAP years ago but no longer uses.  . Prostatitis   . Tubular adenoma of colon 01/2016    Family History  Problem Relation Age of Onset  . Diabetes Mother   . Heart failure Mother   . Hypertension Mother   . Hypertension Father   . Cancer Paternal Grandmother   . Stroke Sister   . Liver disease Brother   . Vascular Disease Brother   . Arthritis Brother   . Colon cancer Neg Hx     Social History   Socioeconomic History  . Marital status: Married    Spouse name: Not on file  . Number of children: Not on file  . Years of education: Not on file  . Highest education level: Not on file  Occupational History  . Not on file  Tobacco Use  . Smoking status: Never Smoker  . Smokeless tobacco: Never Used  Substance and Sexual Activity  . Alcohol use: No    Alcohol/week: 0.0 standard drinks  . Drug use: No  .  Sexual activity: Yes    Birth control/protection: None  Other Topics Concern  . Not on file  Social History Narrative  . Not on file   Social Determinants of Health   Financial Resource  Strain: Not on file  Food Insecurity: Not on file  Transportation Needs: Not on file  Physical Activity: Not on file  Stress: Not on file  Social Connections: Not on file  Intimate Partner Violence: Not on file    Past Medical History, Surgical history, Social history, and Family history were reviewed and updated as appropriate.   Please see review of systems for further details on the patient's review from today.   Objective:   Physical Exam:  There were no vitals taken for this visit.  Physical Exam Constitutional:      General: He is not in acute distress.    Appearance: He is well-developed.  Musculoskeletal:        General: No deformity.  Neurological:     Mental Status: He is alert and oriented to person, place, and time.     Coordination: Coordination normal.  Psychiatric:        Attention and Perception: He is attentive. He does not perceive auditory hallucinations.        Mood and Affect: Mood is anxious. Mood is not depressed. Affect is not labile, blunt, angry or inappropriate.        Speech: Speech normal.        Behavior: Behavior normal.        Thought Content: Thought content normal. Thought content does not include homicidal or suicidal ideation. Thought content does not include homicidal or suicidal plan.        Cognition and Memory: Cognition normal.        Judgment: Judgment normal.     Comments: Insight intact. No auditory or visual hallucinations. No delusions.  Better cogninition and mood. minimal depression      Lab Review:     Component Value Date/Time   NA 141 12/28/2018 0941   K 4.0 12/28/2018 0941   CL 104 12/28/2018 0941   CO2 22 12/28/2018 0941   GLUCOSE 88 12/28/2018 0941   GLUCOSE 111 (H) 06/30/2018 1311   BUN 16 12/28/2018 0941   CREATININE 1.30 (H) 12/28/2018 0941   CALCIUM 9.4 12/28/2018 0941   PROT 6.6 12/28/2018 0941   ALBUMIN 4.5 12/28/2018 0941   AST 21 12/28/2018 0941   ALT 22 12/28/2018 0941   ALKPHOS 115 12/28/2018  0941   BILITOT 0.7 12/28/2018 0941   GFRNONAA 55 (L) 12/28/2018 0941   GFRAA 63 12/28/2018 0941       Component Value Date/Time   WBC 8.1 12/28/2018 0941   WBC 5.4 06/30/2018 1311   RBC 4.81 12/28/2018 0941   RBC 4.52 06/30/2018 1311   HGB 15.2 12/28/2018 0941   HCT 42.9 12/28/2018 0941   PLT 186 12/28/2018 0941   MCV 89 12/28/2018 0941   MCH 31.6 12/28/2018 0941   MCH 31.9 06/30/2018 1311   MCHC 35.4 12/28/2018 0941   MCHC 32.8 06/30/2018 1311   RDW 12.9 12/28/2018 0941   LYMPHSABS 1.1 12/28/2018 0941   MONOABS 0.5 06/30/2018 1311   EOSABS 0.1 12/28/2018 0941   BASOSABS 0.0 12/28/2018 0941    No results found for: POCLITH, LITHIUM   No results found for: PHENYTOIN, PHENOBARB, VALPROATE, CBMZ   .res Assessment: Plan:    Devontre was seen today for follow-up, bipolar affective disorder, currently depressed, moderate ( and  other.  Diagnoses and all orders for this visit:  Bipolar affective disorder, currently depressed, mild (HCC) -     pramipexole (MIRAPEX) 0.25 MG tablet; Take 1 tablet (0.25 mg total) by mouth in the morning and at bedtime.  Generalized anxiety disorder  Tremor due to multiple drugs  Subacute confusional state  Dx bipolar at 74 yo.  Bipolar depression is not fully managed.  He is isolated, inactive, mainly watching TV, and has reduced interest and motivation.  He is not profoundly sad.  He was making progress at the last visit and we want to have him continue to be physically active and mentally stimulated.  Emphasize this to both he and his wife.  He needs to continue exercising and specifically walking and if he does these things we may be able to discontinue the Wellbutrin in the future.   Concentration is better some.  History pseudodementia with depression which is largely resolved.  Depression under control.  No mood swings.   Continue bupropion XL 300 mg tablets each morning. No higher DT anxiety risk and no traditional stimulants DT afib.    Wife& patient has seen a big improvement with Pramipexole 0.375 mg tablet  twice daily They ask consideration be given to the following: If his energy gets better by treating his tachycardia they would like to consider reducing the pramipexole or possibly eliminating it at follow-up.   For energy trial reduction pramipexole to 0.25 mg BID.  If gets more depressed call. Alternative of modafinil 100  Anxiety is generally managed.    Tremor is manageable.  Discussed the that excess caffeine with Wellbutrin could exacerbate it. Balance problem much improved with dosing Trileptal all in the evening 1200 mg HS.    Minimize benzodiazepine because of the possible side effect of confusion.  At this time he is taking it rarely.  We discussed the short-term risks associated with benzodiazepines including sedation and increased fall risk among others.  Discussed long-term side effect risk including dependence, potential withdrawal symptoms, and the potential eventual dose-related risk of dementia.  Discussed side effect of each medication  Follow-up 3-4 mos  This was a 30-minute appointment  Lynder Parents, MD, DFAPA   Please see After Visit Summary for patient specific instructions.  No future appointments.  No orders of the defined types were placed in this encounter.     -------------------------------

## 2020-09-11 DIAGNOSIS — F3162 Bipolar disorder, current episode mixed, moderate: Secondary | ICD-10-CM | POA: Diagnosis not present

## 2020-09-11 DIAGNOSIS — I4821 Permanent atrial fibrillation: Secondary | ICD-10-CM | POA: Diagnosis not present

## 2020-09-11 DIAGNOSIS — I129 Hypertensive chronic kidney disease with stage 1 through stage 4 chronic kidney disease, or unspecified chronic kidney disease: Secondary | ICD-10-CM | POA: Diagnosis not present

## 2020-09-11 DIAGNOSIS — E039 Hypothyroidism, unspecified: Secondary | ICD-10-CM | POA: Diagnosis not present

## 2020-09-12 DIAGNOSIS — L570 Actinic keratosis: Secondary | ICD-10-CM | POA: Diagnosis not present

## 2020-09-12 DIAGNOSIS — X32XXXA Exposure to sunlight, initial encounter: Secondary | ICD-10-CM | POA: Diagnosis not present

## 2020-11-07 ENCOUNTER — Other Ambulatory Visit: Payer: Self-pay | Admitting: Psychiatry

## 2020-11-07 DIAGNOSIS — F3131 Bipolar disorder, current episode depressed, mild: Secondary | ICD-10-CM

## 2020-12-04 ENCOUNTER — Other Ambulatory Visit: Payer: Self-pay

## 2020-12-04 ENCOUNTER — Encounter: Payer: Self-pay | Admitting: Psychiatry

## 2020-12-04 ENCOUNTER — Ambulatory Visit (INDEPENDENT_AMBULATORY_CARE_PROVIDER_SITE_OTHER): Payer: Medicare Other | Admitting: Psychiatry

## 2020-12-04 DIAGNOSIS — F3131 Bipolar disorder, current episode depressed, mild: Secondary | ICD-10-CM

## 2020-12-04 DIAGNOSIS — G251 Drug-induced tremor: Secondary | ICD-10-CM | POA: Diagnosis not present

## 2020-12-04 DIAGNOSIS — F411 Generalized anxiety disorder: Secondary | ICD-10-CM

## 2020-12-04 MED ORDER — PRAMIPEXOLE DIHYDROCHLORIDE 0.25 MG PO TABS: 0.2500 mg | ORAL_TABLET | Freq: Two times a day (BID) | ORAL | 1 refills | Status: DC

## 2020-12-04 MED ORDER — BUPROPION HCL ER (XL) 300 MG PO TB24: 300.0000 mg | ORAL_TABLET | Freq: Every day | ORAL | 1 refills | Status: DC

## 2020-12-04 MED ORDER — OXCARBAZEPINE 600 MG PO TABS: 1200.0000 mg | ORAL_TABLET | Freq: Every day | ORAL | 1 refills | Status: DC

## 2020-12-04 NOTE — Progress Notes (Signed)
Casey [name] [id number] [birth year] 75 y.o.    Subjective:   Patient ID:  Casey [name] is a 75 y.o. (DOB 01/21/1946) male.  Chief Complaint:  Chief Complaint  Patient presents with  . Follow-up  . Fatigue  . Depression  . Stress    HPI seen with wife Casey [name] presents for follow-up of bipolar disorder and anxiety and history of confusion  At visit  Dec 02, 2018.  He was having confusion and benztropine was stopped to see if that was the cause.  Also it was having balance issues and therefore Trileptal was switched all to nighttime 1200 mg nightly. Confusion much better per both of them with DC benztropine.  Balance better with change Trileptal.   visit was January 13, 2019 and he was doing well as noted above and he was encouraged not to make further med changes at that time.  visit April 13, 2019.  He was doing better with regard to depression than he had in quite some time on the Wellbutrin and no meds were changed.  He was having health problems with pulmonary effusion and fluid removed twice related to fall in January 2020.  Last seen September 13, 2019.  For residual depression the following decisions were made: Balance problem much improved with dosing Trileptal all in the evening. Depression under partial control & wife wants it to be better per his wife.  No mood swings.   Increase bupropion XL to 2 of the 150 mg tablets in the morning until the current bottle is gone. Then new RX will be 1 of the 300 mg tablets each morning.  November 08, 2019 appointment the following is noted:  Seen with wife, Casey [name]. Small amount of improvement with change.  Anxiety got worse.  Notices when he goes out.  Worries about social and political things.  Wife keeps him active at home.  Allerigies bother him..  No sig caffeine except tea when goes out.  Wife sees him as a little more active and initiative.  More productive.  Started some cooking.  Getting out and up better.  A little  more talkative.   "He wants to be hyper but we know what happens". Wife Casey [name] thinks   But for the last 2 years he's done nothing and laid around all the time.  He's a lot more active now. Big improvement. Went to church first time in 2 years. Plan:  For TRD, Pramipexole 0.25 mg tablet 1/2 twice daily for 1 week then 1 twice daily.  12/20/2019 appointment with the following noted:  Seen with wife. A little better motivated to get out but not to get in crowds.  Wife agrees more motivated and went to church once alone without pressure.  Wife says he's more talkative and much  Better interest.  Still not motivated to do things like mow the grass.  Others's have seen a difference. Concentration and memory are improved but not normal. No Se pramipexole.  Getting out of bed earlier and better now.. Wife admisters meds bc earlier he messsed them up. Plan: Wife has seen a big improvement therefore Partial improvement is clear from this so increase Pramipexole 0.375 mg tablet  twice daily  Anxiety is not fully managed.  Partly related to depression. Tremor is manageable.    02/15/20 appt with the following noted:  Send with wife Casey [name] Doing good.  Increased pramipexole as indicated.  Tolerating meds Wellbutrin XL 300 mg AM & Trileptal 1200 mg  HS with it. Much better with depression.  Stays up more and speinding less time in bed.   Wife agrees he's much better and handling things better. 2 1/2 yo 4th cousin girl drowned in pond lately.  Big family. Wife pleased he handled it better. SE constipation ? Related. History Linzess but diarrhea. He's not highly motivated but it is better.  Depression is much improved.  Had been depressed for years. Plan no med changes  06/13/2020 appointment with the following noted: Pretty good but cardiologist noted today increased pulse needing treatment.  Uncontrolled afib even after ablation. Hard to tell about depression bc of fatigue Dt heart problems. Wife said he did  well Thanksgiving and participated more in conversation per wife. Plan: no med changes  09/06/2020 appointment with following noted: Remains on Wellbutrin XL 300 mg every morning, ginkgo biloba 40 mg twice daily, oxcarbazepine 1200 mg nightly, pramipexole 0.75 mg twice daily. Less sig health concerns. He thinks mood has been pretty good.  Consistent with meds. Wife thinks he stays tired and sleepy and wonders if can give him a stimulant.   Sleep good.  Will nap.  Doesn't bother him.  Walks dog 20 min in AM.  Not much initiative per wife.  Not depressed but when depressed he stayed in the bed lasting a couple of years.  History of OSA but lost weight and doesn't think he has apnea anymore.  No CPAP. Chronic afib. Not much caffeine. Plan: Wife& patient has seen a big improvement with Pramipexole 0.375 mg tablet  twice daily They ask consideration be given to the following: If his energy gets better by treating his tachycardia they would like to consider reducing the pramipexole or possibly eliminating it at follow-up.   For energy trial reduction pramipexole to 0.25 mg BID.  If gets more depressed call. Alternative of modafinil 100  12/04/2020 appointment with the following noted: Energy is better with awakening earlier at 81 and doing more and getting out more.  Going to bed later with 8 hour  Sleep. Mood fluctuates with more reacting to wife mainly.  More outspoken.  Per W he has to vent.  She thinks he is too focused on politics and issues.    Patient reports stable mood and denies irritable moods.   Gets out more now. No sig depression and low motivation and low confidence.   Patient denies difficulty with sleep initiation or maintenance, 9 hours. Denies appetite disturbance.  Patient reports that energy and motivation have been reduced.  Patient has difficulty with concentration and memory.  Patient denies any suicidal ideation.   Past Psychiatric Medication Trials: Seroquel 300, olanzapine,  perphenazine, risperidone,  Lithium helped but SE kidneys and prostate,  Latuda,  Abilify,  alprazolam,  Depakote, Trileptal, carbamazepine, lamotrigine caused nausea, citalopram, sertraline 150,   Wellbutrin 300 anxiety,  Pramipexole 0.375 BID Benztropine confusion        1 prior psych hosp depression                                                    Review of Systems:  Review of Systems  Constitutional: Negative for fatigue.  Eyes: Positive for visual disturbance.  Gastrointestinal: Positive for constipation.  Skin: Negative for rash.       chronic  Neurological: Negative for tremors and weakness.  Psychiatric/Behavioral: Negative for agitation, behavioral problems, confusion, decreased concentration, dysphoric mood, hallucinations, self-injury, sleep disturbance and suicidal ideas. The patient is not nervous/anxious and is not hyperactive.     Medications: I have reviewed the patient's current medications.  Current Outpatient Medications  Medication Sig Dispense Refill  . acetaminophen (TYLENOL) 500 MG tablet Take 1,000 mg by mouth every 6 (six) hours as needed for headache.    . Albuterol Sulfate 108 (90 Base) MCG/ACT AEPB Inhale 1-2 puffs into the lungs as needed.     . bisacodyl (DULCOLAX) 5 MG EC tablet Take 5-15 mg by mouth daily as needed for moderate constipation.    . cholecalciferol (VITAMIN D) 1000 UNITS tablet Take 2,000 Units by mouth daily.    . colchicine 0.6 MG tablet     . fluticasone (FLONASE) 50 MCG/ACT nasal spray Place 2 sprays into both nostrils daily.  11  . Ginkgo Biloba 40 MG TABS Take 40 mg by mouth 2 (two) times daily.    Marland Kitchen levothyroxine (SYNTHROID, LEVOTHROID) 25 MCG tablet Take 25 mcg by mouth daily before breakfast.     . linaclotide (LINZESS) 72 MCG capsule Take 1 capsule (72 mcg total) by mouth daily before breakfast. 30 capsule 2  . pantoprazole (PROTONIX) 40 MG tablet TAKE ONE TABLET BY MOUTH ONCE DAILY 30 tablet 11  . rivaroxaban  (XARELTO) 20 MG TABS tablet Take 20 mg by mouth daily after supper.     . SUPER B COMPLEX/C PO Take by mouth.    . Wheat Dextrin (BENEFIBER PO) Take 1 Dose by mouth as needed (constapation).     Marland Kitchen buPROPion (WELLBUTRIN XL) 300 MG 24 hr tablet Take 1 tablet (300 mg total) by mouth daily. 90 tablet 1  . metoprolol succinate (TOPROL-XL) 50 MG 24 hr tablet Take 1 tablet (50 mg total) by mouth daily. Take with or immediately following a meal. (Patient not taking: Reported on 09/06/2020) 90 tablet 3  . oxcarbazepine (TRILEPTAL) 600 MG tablet Take 2 tablets (1,200 mg total) by mouth at bedtime. 180 tablet 1  . pramipexole (MIRAPEX) 0.25 MG tablet Take 1 tablet (0.25 mg total) by mouth 2 (two) times daily. 180 tablet 1   No current facility-administered medications for this visit.    Medication Side Effects: resolved  Allergies:  Allergies  Allergen Reactions  . Amlodipine Other (See Comments)    Stopped due to kidney issues  . Vicodin [Hydrocodone-Acetaminophen] Other (See Comments)    Patient is unsure of reaction    Past Medical History:  Diagnosis Date  . Arthritis   . Atrial fibrillation ([place]) 01/12/2010   2D Echo EF=>55%  . Bipolar disorder ([place])   . Chronic back pain   . Depression   . Dysrhythmia    AFib  . History of colonic polyps   . History of gout   . Hyperlipidemia   . Hypertension   . Hypothyroidism   . Morbid obesity ([place])   . OSA (obstructive sleep apnea)    AHI was 27.62hr RDI was 34.3 hr REM 0.00hr; had CPAP years ago but no longer uses.  . Prostatitis   . Tubular adenoma of colon 01/2016    Family History  Problem Relation Age of Onset  . Diabetes Mother   . Heart failure Mother   . Hypertension Mother   . Hypertension Father   . Cancer Paternal Grandmother   . Stroke Sister   . Liver disease Brother   . Vascular Disease Brother   . Arthritis  Brother   . Colon cancer Neg Hx     Social History   Socioeconomic History  . Marital status: Married     Spouse name: Not on file  . Number of children: Not on file  . Years of education: Not on file  . Highest education level: Not on file  Occupational History  . Not on file  Tobacco Use  . Smoking status: Never Smoker  . Smokeless tobacco: Never Used  Substance and Sexual Activity  . Alcohol use: No    Alcohol/week: 0.0 standard drinks  . Drug use: No  . Sexual activity: Yes    Birth control/protection: None  Other Topics Concern  . Not on file  Social History Narrative  . Not on file   Social Determinants of Health   Financial Resource Strain: Not on file  Food Insecurity: Not on file  Transportation Needs: Not on file  Physical Activity: Not on file  Stress: Not on file  Social Connections: Not on file  Intimate Partner Violence: Not on file    Past Medical History, Surgical history, Social history, and Family history were reviewed and updated as appropriate.   Please see review of systems for further details on the patient's review from today.   Objective:   Physical Exam:  There were no vitals taken for this visit.  Physical Exam Constitutional:      General: He is not in acute distress.    Appearance: He is well-developed.  Musculoskeletal:        General: No deformity.  Neurological:     Mental Status: He is alert and oriented to person, place, and time.     Coordination: Coordination normal.  Psychiatric:        Attention and Perception: He is attentive. He does not perceive auditory hallucinations.        Mood and Affect: Mood is anxious. Mood is not depressed. Affect is not labile, blunt, angry or inappropriate.        Speech: Speech normal.        Behavior: Behavior normal.        Thought Content: Thought content normal. Thought content does not include homicidal or suicidal ideation. Thought content does not include homicidal or suicidal plan.        Cognition and Memory: Cognition normal.        Judgment: Judgment normal.     Comments: Insight  intact. No auditory or visual hallucinations. No delusions.  Better cogninition and mood. minimal depression      Lab Review:     Component Value Date/Time   NA 141 12/28/2018 0941   K 4.0 12/28/2018 0941   CL 104 12/28/2018 0941   CO2 22 12/28/2018 0941   GLUCOSE 88 12/28/2018 0941   GLUCOSE 111 (H) 06/30/2018 1311   BUN 16 12/28/2018 0941   CREATININE 1.30 (H) 12/28/2018 0941   CALCIUM 9.4 12/28/2018 0941   PROT 6.6 12/28/2018 0941   ALBUMIN 4.5 12/28/2018 0941   AST 21 12/28/2018 0941   ALT 22 12/28/2018 0941   ALKPHOS 115 12/28/2018 0941   BILITOT 0.7 12/28/2018 0941   GFRNONAA 55 (L) 12/28/2018 0941   GFRAA 63 12/28/2018 0941       Component Value Date/Time   WBC 8.1 12/28/2018 0941   WBC 5.4 06/30/2018 1311   RBC 4.81 12/28/2018 0941   RBC 4.52 06/30/2018 1311   HGB 15.2 12/28/2018 0941   HCT 42.9 12/28/2018 0941   PLT 186 12/28/2018  0941   MCV 89 12/28/2018 0941   MCH 31.6 12/28/2018 0941   MCH 31.9 06/30/2018 1311   MCHC 35.4 12/28/2018 0941   MCHC 32.8 06/30/2018 1311   RDW 12.9 12/28/2018 0941   LYMPHSABS 1.1 12/28/2018 0941   MONOABS 0.5 06/30/2018 1311   EOSABS 0.1 12/28/2018 0941   BASOSABS 0.0 12/28/2018 0941    No results found for: POCLITH, LITHIUM   No results found for: PHENYTOIN, PHENOBARB, VALPROATE, CBMZ   .res Assessment: Plan:    Doyle was seen today for follow-up, fatigue, depression and stress.  Diagnoses and all orders for this visit:  Bipolar affective disorder, currently depressed, mild (HCC) -     pramipexole (MIRAPEX) 0.25 MG tablet; Take 1 tablet (0.25 mg total) by mouth 2 (two) times daily. -     oxcarbazepine (TRILEPTAL) 600 MG tablet; Take 2 tablets (1,200 mg total) by mouth at bedtime. -     buPROPion (WELLBUTRIN XL) 300 MG 24 hr tablet; Take 1 tablet (300 mg total) by mouth daily.  Generalized anxiety disorder  Tremor due to multiple drugs  Dx bipolar at 75 yo.  Bipolar depression is not fully managed.  He is  isolated, inactive, mainly watching TV, and has reduced interest and motivation.  He is not profoundly sad.  He was making progress at the last visit and we want to have him continue to be physically active and mentally stimulated.  Emphasize this to both he and his wife.  He needs to continue exercising and specifically walking and if he does these things we may be able to discontinue the Wellbutrin in the future.   Concentration is better some.  History pseudodementia with depression which is largely resolved.  Depression under control.  No mood swings.   Continue bupropion XL 300 mg tablets each morning. Continue Trileptal 600 mg 2 at HS. No higher DT anxiety risk and no traditional stimulants DT afib.   Wife & patient has seen a big improvement with Pramipexole and able to reduce to  0.25 mg tablet  twice daily They ask consideration be given to the following: If his energy gets better by treating his tachycardia they would like to consider reducing the pramipexole or possibly eliminating it at follow-up.   For energy trial reduction pramipexole to 0.25 mg BID is better now.  Alternative of modafinil 100  Anxiety is generally managed.    Tremor is manageable.  Discussed the that excess caffeine with Wellbutrin could exacerbate it. Balance problem much improved with dosing Trileptal all in the evening 1200 mg HS.    Minimize benzodiazepine because of the possible side effect of confusion.  At this time he is taking it rarely.  We discussed the short-term risks associated with benzodiazepines including sedation and increased fall risk among others.  Discussed long-term side effect risk including dependence, potential withdrawal symptoms, and the potential eventual dose-related risk of dementia.  Discussed side effect of each medication  Follow-up 4-6  mos  This was a 30-minute appointment  Lynder Parents, MD, DFAPA   Please see After Visit Summary for patient specific instructions.  No  future appointments.  No orders of the defined types were placed in this encounter.     -------------------------------

## 2020-12-05 DIAGNOSIS — B9689 Other specified bacterial agents as the cause of diseases classified elsewhere: Secondary | ICD-10-CM | POA: Diagnosis not present

## 2020-12-05 DIAGNOSIS — L0212 Furuncle of neck: Secondary | ICD-10-CM | POA: Diagnosis not present

## 2020-12-05 DIAGNOSIS — X32XXXD Exposure to sunlight, subsequent encounter: Secondary | ICD-10-CM | POA: Diagnosis not present

## 2020-12-05 DIAGNOSIS — L570 Actinic keratosis: Secondary | ICD-10-CM | POA: Diagnosis not present

## 2020-12-06 DIAGNOSIS — H31002 Unspecified chorioretinal scars, left eye: Secondary | ICD-10-CM | POA: Diagnosis not present

## 2020-12-06 DIAGNOSIS — H5203 Hypermetropia, bilateral: Secondary | ICD-10-CM | POA: Diagnosis not present

## 2020-12-12 DIAGNOSIS — I4821 Permanent atrial fibrillation: Secondary | ICD-10-CM | POA: Diagnosis not present

## 2020-12-12 DIAGNOSIS — E039 Hypothyroidism, unspecified: Secondary | ICD-10-CM | POA: Diagnosis not present

## 2020-12-12 DIAGNOSIS — I129 Hypertensive chronic kidney disease with stage 1 through stage 4 chronic kidney disease, or unspecified chronic kidney disease: Secondary | ICD-10-CM | POA: Diagnosis not present

## 2020-12-12 DIAGNOSIS — N183 Chronic kidney disease, stage 3 unspecified: Secondary | ICD-10-CM | POA: Diagnosis not present

## 2021-01-09 ENCOUNTER — Encounter: Payer: Self-pay | Admitting: Internal Medicine

## 2021-01-10 ENCOUNTER — Encounter: Payer: Self-pay | Admitting: Internal Medicine

## 2021-01-25 DIAGNOSIS — M25551 Pain in right hip: Secondary | ICD-10-CM | POA: Diagnosis not present

## 2021-01-25 DIAGNOSIS — M1991 Primary osteoarthritis, unspecified site: Secondary | ICD-10-CM | POA: Diagnosis not present

## 2021-01-25 DIAGNOSIS — E663 Overweight: Secondary | ICD-10-CM | POA: Diagnosis not present

## 2021-01-25 DIAGNOSIS — Z6829 Body mass index (BMI) 29.0-29.9, adult: Secondary | ICD-10-CM | POA: Diagnosis not present

## 2021-02-07 ENCOUNTER — Other Ambulatory Visit: Payer: Self-pay

## 2021-02-07 ENCOUNTER — Ambulatory Visit (HOSPITAL_COMMUNITY)
Admission: RE | Admit: 2021-02-07 | Discharge: 2021-02-07 | Disposition: A | Discharge status: Home / Self Care | Payer: Medicare Other | Source: Ambulatory Visit | Attending: Physician Assistant | Admitting: Physician Assistant

## 2021-02-07 ENCOUNTER — Other Ambulatory Visit (HOSPITAL_COMMUNITY): Payer: Self-pay | Admitting: Physician Assistant

## 2021-02-07 DIAGNOSIS — M25551 Pain in right hip: Secondary | ICD-10-CM

## 2021-02-07 DIAGNOSIS — E663 Overweight: Secondary | ICD-10-CM | POA: Diagnosis not present

## 2021-02-07 DIAGNOSIS — Z6829 Body mass index (BMI) 29.0-29.9, adult: Secondary | ICD-10-CM | POA: Diagnosis not present

## 2021-02-12 DIAGNOSIS — M1991 Primary osteoarthritis, unspecified site: Secondary | ICD-10-CM | POA: Diagnosis not present

## 2021-02-12 DIAGNOSIS — M109 Gout, unspecified: Secondary | ICD-10-CM | POA: Diagnosis not present

## 2021-02-12 DIAGNOSIS — Z6829 Body mass index (BMI) 29.0-29.9, adult: Secondary | ICD-10-CM | POA: Diagnosis not present

## 2021-02-12 DIAGNOSIS — E663 Overweight: Secondary | ICD-10-CM | POA: Diagnosis not present

## 2021-02-12 DIAGNOSIS — M25551 Pain in right hip: Secondary | ICD-10-CM | POA: Diagnosis not present

## 2021-02-16 ENCOUNTER — Other Ambulatory Visit: Payer: Self-pay

## 2021-02-16 ENCOUNTER — Other Ambulatory Visit (HOSPITAL_COMMUNITY): Payer: Self-pay | Admitting: Family Medicine

## 2021-02-16 ENCOUNTER — Ambulatory Visit (HOSPITAL_COMMUNITY)
Admission: RE | Admit: 2021-02-16 | Discharge: 2021-02-16 | Disposition: A | Discharge status: Home / Self Care | Payer: Medicare Other | Source: Ambulatory Visit | Attending: Family Medicine | Admitting: Family Medicine

## 2021-02-16 DIAGNOSIS — M25552 Pain in left hip: Secondary | ICD-10-CM | POA: Insufficient documentation

## 2021-02-16 DIAGNOSIS — Z6829 Body mass index (BMI) 29.0-29.9, adult: Secondary | ICD-10-CM | POA: Diagnosis not present

## 2021-02-16 DIAGNOSIS — E663 Overweight: Secondary | ICD-10-CM | POA: Diagnosis not present

## 2021-02-19 DIAGNOSIS — M79673 Pain in unspecified foot: Secondary | ICD-10-CM | POA: Diagnosis not present

## 2021-02-19 DIAGNOSIS — E6609 Other obesity due to excess calories: Secondary | ICD-10-CM | POA: Diagnosis not present

## 2021-02-19 DIAGNOSIS — Z6830 Body mass index (BMI) 30.0-30.9, adult: Secondary | ICD-10-CM | POA: Diagnosis not present

## 2021-02-19 DIAGNOSIS — R21 Rash and other nonspecific skin eruption: Secondary | ICD-10-CM | POA: Diagnosis not present

## 2021-02-20 ENCOUNTER — Other Ambulatory Visit: Payer: Self-pay

## 2021-02-20 ENCOUNTER — Encounter: Payer: Self-pay | Admitting: Psychiatry

## 2021-02-20 ENCOUNTER — Ambulatory Visit: Payer: Medicare Other | Admitting: Psychiatry

## 2021-02-20 DIAGNOSIS — G251 Drug-induced tremor: Secondary | ICD-10-CM | POA: Diagnosis not present

## 2021-02-20 DIAGNOSIS — F3131 Bipolar disorder, current episode depressed, mild: Secondary | ICD-10-CM | POA: Diagnosis not present

## 2021-02-20 DIAGNOSIS — F411 Generalized anxiety disorder: Secondary | ICD-10-CM | POA: Diagnosis not present

## 2021-02-20 DIAGNOSIS — F3110 Bipolar disorder, current episode manic without psychotic features, unspecified: Secondary | ICD-10-CM

## 2021-02-20 MED ORDER — BUPROPION HCL ER (XL) 150 MG PO TB24: ORAL_TABLET | ORAL | 0 refills | Status: DC

## 2021-02-20 NOTE — Patient Instructions (Addendum)
Reduce Wellbutrin buporpion to 150 mg tablet, 1 daily for 2 weeks and then stop it. Call in 3-4 weeks if still hyper

## 2021-02-20 NOTE — Progress Notes (Signed)
Casey [name] [id number] [birth year] 75 y.o.    Subjective:   Patient ID:  Casey [name] is a 75 y.o. (DOB 1946-07-15) male.  Chief Complaint:  Chief Complaint  Patient presents with   Follow-up   Bipolar affective disorder, currently depressed, mild ([place])   Manic Behavior    HPI seen with wife Casey [name] presents for follow-up of bipolar disorder and anxiety and history of confusion  At visit  Dec 02, 2018.  He was having confusion and benztropine was stopped to see if that was the cause.  Also it was having balance issues and therefore Trileptal was switched all to nighttime 1200 mg nightly. Confusion much better per both of them with DC benztropine.  Balance better with change Trileptal.   visit was January 13, 2019 and he was doing well as noted above and he was encouraged not to make further med changes at that time.  visit April 13, 2019.  He was doing better with regard to depression than he had in quite some time on the Wellbutrin and no meds were changed.  He was having health problems with pulmonary effusion and fluid removed twice related to fall in January 2020.  Last seen September 13, 2019.  For residual depression the following decisions were made: Balance problem much improved with dosing Trileptal all in the evening. Depression under partial control & wife wants it to be better per his wife.  No mood swings.   Increase bupropion XL to 2 of the 150 mg tablets in the morning until the current bottle is gone. Then new RX will be 1 of the 300 mg tablets each morning.  November 08, 2019 appointment the following is noted:  Seen with wife, Casey [name]. Small amount of improvement with change.  Anxiety got worse.  Notices when he goes out.  Worries about social and political things.  Wife keeps him active at home.  Allerigies bother him..  No sig caffeine except tea when goes out.  Wife sees him as a little more active and initiative.  More productive.  Started some cooking.   Getting out and up better.  A little more talkative.   "He wants to be hyper but we know what happens". Wife Casey [name] thinks   But for the last 2 years he's done nothing and laid around all the time.  He's a lot more active now. Big improvement. Went to church first time in 2 years. Plan:  For TRD, Pramipexole 0.25 mg tablet 1/2 twice daily for 1 week then 1 twice daily.  12/20/2019 appointment with the following noted:  Seen with wife. A little better motivated to get out but not to get in crowds.  Wife agrees more motivated and went to church once alone without pressure.  Wife says he's more talkative and much  Better interest.  Still not motivated to do things like mow the grass.  Others's have seen a difference. Concentration and memory are improved but not normal. No Se pramipexole.  Getting out of bed earlier and better now.. Wife admisters meds bc earlier he messsed them up. Plan: Wife has seen a big improvement therefore Partial improvement is clear from this so increase Pramipexole 0.375 mg tablet  twice daily  Anxiety is not fully managed.  Partly related to depression. Tremor is manageable.    02/15/20 appt with the following noted:  Send with wife Casey [name] Doing good.  Increased pramipexole as indicated.  Tolerating meds Wellbutrin XL 300 mg AM &  Trileptal 1200 mg HS with it. Much better with depression.  Stays up more and speinding less time in bed.   Wife agrees he's much better and handling things better. 2 1/2 yo 4th cousin girl drowned in pond lately.  Big family. Wife pleased he handled it better. SE constipation ? Related. History Linzess but diarrhea. He's not highly motivated but it is better.  Depression is much improved.  Had been depressed for years. Plan no med changes  06/13/2020 appointment with the following noted: Pretty good but cardiologist noted today increased pulse needing treatment.  Uncontrolled afib even after ablation. Hard to tell about depression bc of  fatigue Dt heart problems. Wife said he did well Thanksgiving and participated more in conversation per wife. Plan: no med changes  09/06/2020 appointment with following noted: Remains on Wellbutrin XL 300 mg every morning, ginkgo biloba 40 mg twice daily, oxcarbazepine 1200 mg nightly, pramipexole 0.75 mg twice daily. Less sig health concerns. He thinks mood has been pretty good.  Consistent with meds. Wife thinks he stays tired and sleepy and wonders if can give him a stimulant.   Sleep good.  Will nap.  Doesn't bother him.  Walks dog 20 min in AM.  Not much initiative per wife.  Not depressed but when depressed he stayed in the bed lasting a couple of years.  History of OSA but lost weight and doesn't think he has apnea anymore.  No CPAP. Chronic afib. Not much caffeine. Plan: Wife& patient has seen a big improvement with Pramipexole 0.375 mg tablet  twice daily They ask consideration be given to the following: If his energy gets better by treating his tachycardia they would like to consider reducing the pramipexole or possibly eliminating it at follow-up.   For energy trial reduction pramipexole to 0.25 mg BID.  If gets more depressed call. Alternative of modafinil 100  12/04/2020 appointment with the following noted: Energy is better with awakening earlier at 15 and doing more and getting out more.  Going to bed later with 8 hour  Sleep. Mood fluctuates with more reacting to wife mainly.  More outspoken.  Per W he has to vent.  She thinks he is too focused on politics and issues.   Plan: Depression under control.  No mood swings.   Continue bupropion XL 300 mg tablets each morning. Continue Trileptal 600 mg 2 at HS. No higher DT anxiety risk and no traditional stimulants DT afib.  Wife & patient has seen a big improvement with Pramipexole and able to reduce to  0.25 mg tablet  twice daily  02/20/2021 appointment with the following noted:  seen with wife Prednisone made him real hyper.   Been off of it for at least a couple weeks.  Hyperverbal and hyperactive.  Hip pain interferes with sleep.  Sees doctor tomorrow. Average 3-4 hours at night and no naps bc busy.  No excessive spending but more than usual.  W had MI in June.   Denies appetite disturbance.  Patient reports that energy and motivation have been reduced.  Patient has difficulty with concentration and memory.  Patient denies any suicidal ideation.   Past Psychiatric Medication Trials: Seroquel 300, olanzapine, perphenazine, risperidone,  Lithium helped but SE kidneys and prostate,  Latuda,  Abilify,  alprazolam,  Depakote, Trileptal, carbamazepine, lamotrigine caused nausea, citalopram, sertraline 150,   Wellbutrin 300 anxiety,  Pramipexole 0.375 BID Benztropine confusion        1 prior psych hosp depression  Review of Systems:  Review of Systems  Constitutional:  Negative for fatigue.  Eyes:  Positive for visual disturbance.  Gastrointestinal:  Positive for constipation.  Musculoskeletal:  Positive for arthralgias and gait problem.  Skin:  Negative for rash.       chronic  Neurological:  Negative for tremors and weakness.          Psychiatric/Behavioral:  Negative for agitation, behavioral problems, confusion, decreased concentration, dysphoric mood, hallucinations, self-injury, sleep disturbance and suicidal ideas. The patient is not nervous/anxious and is not hyperactive.    Medications: I have reviewed the patient's current medications.  Current Outpatient Medications  Medication Sig Dispense Refill   acetaminophen (TYLENOL) 500 MG tablet Take 1,000 mg by mouth every 6 (six) hours as needed for headache.     ALPRAZolam (XANAX) 0.5 MG tablet Take 0.5 mg by mouth at bedtime as needed for anxiety.     buPROPion (WELLBUTRIN XL) 300 MG 24 hr tablet Take 1 tablet (300 mg total) by mouth daily. 90 tablet 1   cholecalciferol (VITAMIN D) 1000 UNITS tablet Take  2,000 Units by mouth daily.     clotrimazole (LOTRIMIN) 1 % cream Apply 1 application topically 2 (two) times daily.     fluticasone (FLONASE) 50 MCG/ACT nasal spray Place 2 sprays into both nostrils daily.  11   furosemide (LASIX) 20 MG tablet Take 20 mg by mouth 2 (two) times daily.     gabapentin (NEURONTIN) 100 MG capsule Take 100 mg by mouth 3 (three) times daily.     Ginkgo Biloba 40 MG TABS Take 40 mg by mouth 2 (two) times daily.     levothyroxine (SYNTHROID, LEVOTHROID) 25 MCG tablet Take 25 mcg by mouth daily before breakfast.      oxcarbazepine (TRILEPTAL) 600 MG tablet Take 2 tablets (1,200 mg total) by mouth at bedtime. 180 tablet 1   pantoprazole (PROTONIX) 40 MG tablet TAKE ONE TABLET BY MOUTH ONCE DAILY 30 tablet 11   pramipexole (MIRAPEX) 0.25 MG tablet Take 1 tablet (0.25 mg total) by mouth 2 (two) times daily. 180 tablet 1   rivaroxaban (XARELTO) 20 MG TABS tablet Take 20 mg by mouth daily after supper.      SUPER B COMPLEX/C PO Take by mouth.     Wheat Dextrin (BENEFIBER PO) Take 1 Dose by mouth as needed (constapation).      Albuterol Sulfate 108 (90 Base) MCG/ACT AEPB Inhale 1-2 puffs into the lungs as needed.  (Patient not taking: Reported on 02/20/2021)     bisacodyl (DULCOLAX) 5 MG EC tablet Take 5-15 mg by mouth daily as needed for moderate constipation. (Patient not taking: Reported on 02/20/2021)     colchicine 0.6 MG tablet  (Patient not taking: Reported on 02/20/2021)     linaclotide (LINZESS) 72 MCG capsule Take 1 capsule (72 mcg total) by mouth daily before breakfast. (Patient not taking: Reported on 02/20/2021) 30 capsule 2   metoprolol succinate (TOPROL-XL) 50 MG 24 hr tablet Take 1 tablet (50 mg total) by mouth daily. Take with or immediately following a meal. (Patient not taking: Reported on 09/06/2020) 90 tablet 3   No current facility-administered medications for this visit.    Medication Side Effects: resolved  Allergies:  Allergies  Allergen Reactions    Amlodipine Other (See Comments)    Stopped due to kidney issues   Vicodin [Hydrocodone-Acetaminophen] Other (See Comments)    Patient is unsure of reaction    Past Medical History:  Diagnosis Date  Arthritis    Atrial fibrillation ([place]) 01/12/2010   2D Echo EF=>55%   Bipolar disorder ([facility])    Chronic back pain    Depression    Dysrhythmia    AFib   History of colonic polyps    History of gout    Hyperlipidemia    Hypertension    Hypothyroidism    Morbid obesity ([place])    OSA (obstructive sleep apnea)    AHI was 27.62hr RDI was 34.3 hr REM 0.00hr; had CPAP years ago but no longer uses.   Prostatitis    Tubular adenoma of colon 01/2016    Family History  Problem Relation Age of Onset   Diabetes Mother    Heart failure Mother    Hypertension Mother    Hypertension Father    Cancer Paternal Grandmother    Stroke Sister    Liver disease Brother    Vascular Disease Brother    Arthritis Brother    Colon cancer Neg Hx     Social History   Socioeconomic History   Marital status: Married    Spouse name: Not on file   Number of children: Not on file   Years of education: Not on file   Highest education level: Not on file  Occupational History   Not on file  Tobacco Use   Smoking status: Never   Smokeless tobacco: Never  Substance and Sexual Activity   Alcohol use: No    Alcohol/week: 0.0 standard drinks   Drug use: No   Sexual activity: Yes    Birth control/protection: None  Other Topics Concern   Not on file  Social History Narrative   Not on file   Social Determinants of Health   Financial Resource Strain: Not on file  Food Insecurity: Not on file  Transportation Needs: Not on file  Physical Activity: Not on file  Stress: Not on file  Social Connections: Not on file  Intimate Partner Violence: Not on file    Past Medical History, Surgical history, Social history, and Family history were reviewed and updated as appropriate.   Please see review of  systems for further details on the patient's review from today.   Objective:   Physical Exam:  There were no vitals taken for this visit.  Physical Exam Constitutional:      General: He is not in acute distress. Musculoskeletal:        General: No deformity.  Neurological:     Mental Status: He is alert and oriented to person, place, and time.     Coordination: Coordination normal.  Psychiatric:        Attention and Perception: He is attentive. He does not perceive auditory hallucinations.        Mood and Affect: Mood is not anxious or depressed. Affect is not labile, blunt, angry or inappropriate.        Speech: Speech is rapid and pressured.        Behavior: Behavior normal.        Thought Content: Thought content normal. Thought content does not include homicidal or suicidal ideation. Thought content does not include homicidal or suicidal plan.        Cognition and Memory: Cognition normal.        Judgment: Judgment normal.     Comments: Insight intact. No auditory or visual hallucinations. No delusions.  Better cogninition and mood. Directable.    Lab Review:     Component Value Date/Time   NA 141 12/28/2018  0941   K 4.0 12/28/2018 0941   CL 104 12/28/2018 0941   CO2 22 12/28/2018 0941   GLUCOSE 88 12/28/2018 0941   GLUCOSE 111 (H) 06/30/2018 1311   BUN 16 12/28/2018 0941   CREATININE 1.30 (H) 12/28/2018 0941   CALCIUM 9.4 12/28/2018 0941   PROT 6.6 12/28/2018 0941   ALBUMIN 4.5 12/28/2018 0941   AST 21 12/28/2018 0941   ALT 22 12/28/2018 0941   ALKPHOS 115 12/28/2018 0941   BILITOT 0.7 12/28/2018 0941   GFRNONAA 55 (L) 12/28/2018 0941   GFRAA 63 12/28/2018 0941       Component Value Date/Time   WBC 8.1 12/28/2018 0941   WBC 5.4 06/30/2018 1311   RBC 4.81 12/28/2018 0941   RBC 4.52 06/30/2018 1311   HGB 15.2 12/28/2018 0941   HCT 42.9 12/28/2018 0941   PLT 186 12/28/2018 0941   MCV 89 12/28/2018 0941   MCH 31.6 12/28/2018 0941   MCH 31.9 06/30/2018  1311   MCHC 35.4 12/28/2018 0941   MCHC 32.8 06/30/2018 1311   RDW 12.9 12/28/2018 0941   LYMPHSABS 1.1 12/28/2018 0941   MONOABS 0.5 06/30/2018 1311   EOSABS 0.1 12/28/2018 0941   BASOSABS 0.0 12/28/2018 0941    No results found for: POCLITH, LITHIUM   No results found for: PHENYTOIN, PHENOBARB, VALPROATE, CBMZ   .res Assessment: Plan:    Lucino was seen today for follow-up, bipolar affective disorder, currently depressed, mild ([facility]cc) and manic behavior.  Diagnoses and all orders for this visit:  Bipolar I disorder, most recent episode (or current) manic ([place])  Generalized anxiety disorder  Tremor due to multiple drugs  Bipolar affective disorder, currently depressed, mild ([place]) Dx bipolar at 75 yo.  Bipolar depression has flipped into mania from prednisone.  Disc this at length and risk of crashing into depression if we can't ease him down.  Hesitate to increase Trileptal further DT balance fears.  Mania is not severe.    History pseudodementia with depression which is resolved.  Prednisone triggered some mania in last few weeks and persisted off prednisone.  Avoid prednisone if at all possible.  Wean Wellbutrin over 2 weeks. Continue Trileptal 600 mg 2 at HS. No higher DT anxiety risk and no traditional stimulants DT afib.   Wife & patient has seen a big improvement with Pramipexole and able to reduce to  0.25 mg tablet  twice daily. Continue it unless mania does not resolve with less bupropion. They ask consideration be given to the following: If his energy gets better by treating his tachycardia they would like to consider reducing the pramipexole or possibly eliminating it at follow-up.   For energy trial reduction pramipexole to 0.25 mg BID is better now.   Anxiety is resolved with mania.   Tremor is manageable.  Discussed the that excess caffeine with Wellbutrin could exacerbate it. Balance problem much improved with dosing Trileptal all in the evening 1200 mg HS.     Minimize benzodiazepine because of the possible side effect of confusion.  At this time he is taking it rarely.  We discussed the short-term risks associated with benzodiazepines including sedation and increased fall risk among others.  Discussed long-term side effect risk including dependence, potential withdrawal symptoms, and the potential eventual dose-related risk of dementia. Take Xanax if needed for sleep until mania resolves  Discussed side effect of each medication  Follow-up 2  mos  This was a 30-minute appointment  Lynder Parents, MD, DFAPA  Please see After Visit Summary for patient specific instructions.  Future Appointments  Date Time Provider [place]  02/21/2021  9:15 AM Garald Balding, MD [facility] None  05/22/2021  8:00 AM Mahala Menghini, PA-C [facility]  06/04/2021 11:00 AM Cottle, Billey Co., MD [facility] None  06/21/2021 10:20 AM Croitoru, Dani Gobble, MD [facility]    No orders of the defined types were placed in this encounter.     -------------------------------

## 2021-02-21 ENCOUNTER — Ambulatory Visit: Payer: Self-pay

## 2021-02-21 ENCOUNTER — Encounter: Payer: Self-pay | Admitting: Orthopaedic Surgery

## 2021-02-21 ENCOUNTER — Other Ambulatory Visit: Payer: Self-pay | Admitting: Family Medicine

## 2021-02-21 ENCOUNTER — Ambulatory Visit (INDEPENDENT_AMBULATORY_CARE_PROVIDER_SITE_OTHER): Payer: Medicare Other | Admitting: Orthopaedic Surgery

## 2021-02-21 VITALS — Ht 70.0 in | Wt 209.0 lb

## 2021-02-21 DIAGNOSIS — M545 Low back pain, unspecified: Secondary | ICD-10-CM | POA: Diagnosis not present

## 2021-02-21 DIAGNOSIS — M25512 Pain in left shoulder: Secondary | ICD-10-CM | POA: Insufficient documentation

## 2021-02-21 DIAGNOSIS — M25552 Pain in left hip: Secondary | ICD-10-CM

## 2021-02-21 DIAGNOSIS — M25551 Pain in right hip: Secondary | ICD-10-CM | POA: Diagnosis not present

## 2021-02-21 DIAGNOSIS — G8929 Other chronic pain: Secondary | ICD-10-CM

## 2021-02-21 NOTE — Progress Notes (Signed)
Office Visit Note   Patient: Casey [name]           Date of Birth: 30-Mar-1946           MRN: [id number] Visit Date: 02/21/2021              Requested by: Sharilyn Sites, [address],  [place] [address] PCP: Sharilyn Sites, MD   Assessment & Plan: Visit Diagnoses:  1. Acute pain of left shoulder   2. Bilateral hip pain   3. Chronic bilateral low back pain, unspecified whether sciatica present     Plan: Casey [name] is accompanied by his wife and here for evaluation of hip back and left shoulder pain.  He has had some trouble with his right hip for approximately a year and on the left side about 3 months.  He denies any recent injury or trauma.  He did have some films of his pelvis through his primary care physician's office which I reviewed.  I did not see significant arthritic changes in either hip.  He does have a chronic history of low back pain and is been evaluated by Dr. Carloyn Manner and an orthopedist in [place] within the last several years.  Casey [name] relates that he was told that surgery would not benefit his back.  He believes he may have had some injections in the past.  He is not having much pain in his legs or groin but does have some pain along the lateral aspect of the both of his hips.  He does have difficulty being specific about his pain.  He does have some difficulty getting up and down.  He also has a history of balance issues and uses a cane.  He is also been told not to lift over 20 pounds.  He has had several MRI scans of his lumbar spine.  We found scans from 2011 and 2017.  The latest scan demonstrated advanced facet arthropathy at L5-S1 with resultant moderate bilateral foraminal stenosis left greater than right.  There was mild bilateral subarticular stenosis at that level without frank neural impingement.  He had mild canal and foraminal stenosis at L3-4 and L4-5 related to disc bulge, facet disease and short pedicles.  Suspect that the back is really the  cause of his hip problem.  I think from a diagnostic and therapeutic standpoint that it is worth trying an epidural steroid.  We will schedule this and then have him return to see me in 1 month and reevaluate both the shoulder as well as the back.  Apparently he is not a candidate for surgery so he is aware that his problem may be chronic.  He had a fall on Sunday apparently related to his balance and landed on his left shoulder.  Has had a little ecchymosis but considerable pain with motion and overhead activity.  Films demonstrated a bulbous AC joint but tickly of the distal clavicle.  No acute changes.  I will place him in a sling and then reevaluate his shoulder over the next 3 to 4 weeks and consider an MRI scan.  Follow-Up Instructions: Return in about 1 month (around 03/24/2021).   Orders:  Orders Placed This Encounter  Procedures   XR Lumbar Spine 2-3 Views   XR Shoulder Left   Epidural Steroid Injection - Lumbar/Sacral (Ancillary Performed)   No orders of the defined types were placed in this encounter.     Procedures: No procedures performed   Clinical Data: No  additional findings.   Subjective: Chief Complaint  Patient presents with   Right Hip - Pain   Left Hip - Pain  Patient presents today for bilateral hip pain. He states that his right hip started to hurt about a year ago and his left started a few months ago. He states that his left hip is worse than the right. He has difficulty getting up due to pain. His pain is located at the lateral aspect of his hips. No groin pain or pain down his legs. No previous hip surgery. He had x-rays done at [facility].  Also relates that he fell on Sunday and is been experiencing some pain in his left shoulder.  He has had difficulty raising his arm over his head and performing certain motions.  He also has had difficulty localizing his pain. Multiple medical comorbidities  HPI  Review of Systems   Objective: Vital Signs: Ht 5'  10" (1.778 m)   Wt 209 lb (94.8 kg)   BMI 29.99 kg/m   Physical Exam Constitutional:      Appearance: He is well-developed.  Pulmonary:     Effort: Pulmonary effort is normal.  Skin:    General: Skin is warm and dry.     Findings: Lesion present.  Neurological:     Mental Status: He is alert and oriented to person, place, and time.  Psychiatric:        Behavior: Behavior normal.    Ortho Exam awake alert and oriented x3.  Accompanied by his wife.  Does use a cane to help with his balance and his ambulation.  He did not appear to have any significant loss of motion or pain with range of motion of either hip.  Hamstrings were little bit tight but straight leg raise was otherwise negative.  He did have some percussible tenderness of his lumbar spine but no particular pain about either sacrum.  Some areas of trigger point tenderness about the lateral aspect of both of his hips.  He also has a diffuse rash in both upper and lower extremities and has an appointment to see the dermatologist within the next week.  Left shoulder with some mild resolving ecchymosis along the anterior aspect of the chest wall.  Multiple areas of tenderness beginning along the distal third of the clavicle extending even posteriorly.  Skin was intact except for the above-mentioned rash.  He had pain with overhead motion I could just about place his arm fully overhead but with pain.  Biceps appears to be intact.  Had good grip and release.  Difficult to fully evaluate the shoulder related to his pain.  Specialty Comments:  No specialty comments available.  Imaging: XR Lumbar Spine 2-3 Views  Result Date: 02/21/2021 Films of the lumbar spine were obtained in 2 projections.  There is diffuse calcification of the anterior longitudinal ligament throughout the lumbar spine.  Listhesis.  There is facet sclerosis at L4-5 and L5-S1.  Some straightening of the normal lumbar lordosis.  No acute changes    PMFS  History: Patient Active Problem List   Diagnosis Date Noted   Pain in left shoulder 02/21/2021   Constipation 12/28/2018   N&V (nausea and vomiting) 12/28/2018   GERD (gastroesophageal reflux disease) 12/28/2018   Abdominal pain 12/28/2018   Weight loss, unintentional 12/28/2018   Atrial fibrillation with RVR ([place])    Sepsis ([place]) 123XX123   Acute metabolic encephalopathy 123XX123   Thrombocytopenia ([place]) 06/13/2018   Renal insufficiency 06/13/2018  Anxiety 06/12/2018   Bipolar disorder ([place]) 06/12/2018   Chronic anticoagulation 05/05/2017   Sleep apnea 05/05/2017   Edema leg 05/05/2017   Dysphasia 02/07/2017   Mild obesity 05/10/2016   History of colonic polyps    Diverticulosis of colon without hemorrhage    Hyperlipidemia LDL goal <130 05/16/2013   KNEE PAIN 08/07/2009   KNEE SPRAIN, ACUTE 08/07/2009   COLONIC POLYPS, HX OF 05/10/2009   Hypothyroidism 05/08/2009   History of bipolar disorder 05/08/2009   Essential hypertension 05/08/2009   Permanent atrial fibrillation 05/08/2009   HEMATOCHEZIA 05/08/2009   LOW BACK PAIN, CHRONIC 05/08/2009   ABDOMINAL PAIN 05/08/2009   Past Medical History:  Diagnosis Date   Arthritis    Atrial fibrillation ([place]) 01/12/2010   2D Echo EF=>55%   Bipolar disorder ([facility])    Chronic back pain    Depression    Dysrhythmia    AFib   History of colonic polyps    History of gout    Hyperlipidemia    Hypertension    Hypothyroidism    Morbid obesity ([place])    OSA (obstructive sleep apnea)    AHI was 27.62hr RDI was 34.3 hr REM 0.00hr; had CPAP years ago but no longer uses.   Prostatitis    Tubular adenoma of colon 01/2016    Family History  Problem Relation Age of Onset   Diabetes Mother    Heart failure Mother    Hypertension Mother    Hypertension Father    Cancer Paternal Grandmother    Stroke Sister    Liver disease Brother    Vascular Disease Brother    Arthritis Brother    Colon cancer Neg Hx     Past Surgical  History:  Procedure Laterality Date   APPENDECTOMY  1959   asthma     CATARACT EXTRACTION Bilateral 1995   COLONOSCOPY  2011   RMR: left-sided diverticula, minimal rectal friability. No polyps. Repeat 5 years.   COLONOSCOPY WITH PROPOFOL N/A 01/15/2016   Dr.Rourk- diverticulosis in the entire examined colon, multiple rectal and colonic polyps, internal hemorrhoids. bx= tubular adenomas and hyperplastic polyps.    HAND / FINGER LESION EXCISION Right    IR THORACENTESIS ASP PLEURAL SPACE W/IMG GUIDE  03/01/2019   IR THORACENTESIS ASP PLEURAL SPACE W/IMG GUIDE  03/31/2019   POLYPECTOMY  01/15/2016   Procedure: POLYPECTOMY;  Surgeon: Daneil Dolin, MD;  [facility];  Service: Endoscopy;;   right shoulder surgery     TONSILLECTOMY  1955   Social History   Occupational History   Not on file  Tobacco Use   Smoking status: Never   Smokeless tobacco: Never  Substance and Sexual Activity   Alcohol use: No    Alcohol/week: 0.0 standard drinks   Drug use: No   Sexual activity: Yes    Birth control/protection: None

## 2021-02-27 ENCOUNTER — Ambulatory Visit
Admission: RE | Admit: 2021-02-27 | Discharge: 2021-02-27 | Disposition: A | Discharge status: Home / Self Care | Payer: Medicare Other | Source: Ambulatory Visit | Attending: Orthopaedic Surgery | Admitting: Orthopaedic Surgery

## 2021-02-27 ENCOUNTER — Other Ambulatory Visit: Payer: Self-pay

## 2021-02-27 DIAGNOSIS — M545 Low back pain, unspecified: Secondary | ICD-10-CM

## 2021-02-27 DIAGNOSIS — M5126 Other intervertebral disc displacement, lumbar region: Secondary | ICD-10-CM | POA: Diagnosis not present

## 2021-02-27 DIAGNOSIS — G8929 Other chronic pain: Secondary | ICD-10-CM

## 2021-02-27 MED ORDER — METHYLPREDNISOLONE ACETATE 40 MG/ML INJ SUSP (RADIOLOG
80.0000 mg | Freq: Once | INTRAMUSCULAR | Status: AC
  Administered 2021-02-27: 80 mg via EPIDURAL

## 2021-02-27 MED ORDER — IOPAMIDOL (ISOVUE-M 200) INJECTION 41%
1.0000 mL | Freq: Once | INTRAMUSCULAR | Status: AC
  Administered 2021-02-27: 1 mL via EPIDURAL

## 2021-02-27 NOTE — Discharge Instructions (Signed)
Post Procedure Spinal Discharge Instruction Sheet  You may resume a regular diet and any medications that you routinely take (including pain medications) unless otherwise noted by MD.  No driving day of procedure.  Light activity throughout the rest of the day.  Do not do any strenuous work, exercise, bending or lifting.  The day following the procedure, you can resume normal physical activity but you should refrain from exercising or physical therapy for at least three days thereafter.  You may apply ice to the injection site, 20 minutes on, 20 minutes off, as needed. Do not apply ice directly to skin.    Common Side Effects:  Headaches- take your usual medications as directed by your physician.  Increase your fluid intake.  Caffeinated beverages may be helpful.  Lie flat in bed until your headache resolves.  Restlessness or inability to sleep- you may have trouble sleeping for the next few days.  Ask your referring physician if you need any medication for sleep.  Facial flushing or redness- should subside within a few days.  Increased pain- a temporary increase in pain a day or two following your procedure is not unusual.  Take your pain medication as prescribed by your referring physician.  Leg cramps  Please contact our office at [phone] for the following symptoms: Fever greater than 100 degrees. Headaches unresolved with medication after 2-3 days. Increased swelling, pain, or redness at injection site.   Thank you for visiting [facility] today.    YOU MAY RESUME YOUR XARELTO 24 HOURS AFTER INJECTION

## 2021-02-28 DIAGNOSIS — I872 Venous insufficiency (chronic) (peripheral): Secondary | ICD-10-CM | POA: Diagnosis not present

## 2021-03-05 DIAGNOSIS — R600 Localized edema: Secondary | ICD-10-CM | POA: Diagnosis not present

## 2021-03-05 DIAGNOSIS — Z6830 Body mass index (BMI) 30.0-30.9, adult: Secondary | ICD-10-CM | POA: Diagnosis not present

## 2021-03-05 DIAGNOSIS — E6609 Other obesity due to excess calories: Secondary | ICD-10-CM | POA: Diagnosis not present

## 2021-03-14 DIAGNOSIS — L820 Inflamed seborrheic keratosis: Secondary | ICD-10-CM | POA: Diagnosis not present

## 2021-03-14 DIAGNOSIS — L02229 Furuncle of trunk, unspecified: Secondary | ICD-10-CM | POA: Diagnosis not present

## 2021-03-14 DIAGNOSIS — I872 Venous insufficiency (chronic) (peripheral): Secondary | ICD-10-CM | POA: Diagnosis not present

## 2021-03-14 DIAGNOSIS — B9689 Other specified bacterial agents as the cause of diseases classified elsewhere: Secondary | ICD-10-CM | POA: Diagnosis not present

## 2021-03-16 DIAGNOSIS — Z6829 Body mass index (BMI) 29.0-29.9, adult: Secondary | ICD-10-CM | POA: Diagnosis not present

## 2021-03-16 DIAGNOSIS — E663 Overweight: Secondary | ICD-10-CM | POA: Diagnosis not present

## 2021-03-16 DIAGNOSIS — G4700 Insomnia, unspecified: Secondary | ICD-10-CM | POA: Diagnosis not present

## 2021-03-21 ENCOUNTER — Telehealth: Payer: Self-pay | Admitting: Psychiatry

## 2021-03-21 ENCOUNTER — Encounter: Payer: Self-pay | Admitting: Orthopaedic Surgery

## 2021-03-21 ENCOUNTER — Other Ambulatory Visit: Payer: Self-pay

## 2021-03-21 ENCOUNTER — Ambulatory Visit (INDEPENDENT_AMBULATORY_CARE_PROVIDER_SITE_OTHER): Payer: Medicare Other | Admitting: Orthopaedic Surgery

## 2021-03-21 DIAGNOSIS — G8929 Other chronic pain: Secondary | ICD-10-CM | POA: Diagnosis not present

## 2021-03-21 DIAGNOSIS — M25512 Pain in left shoulder: Secondary | ICD-10-CM | POA: Diagnosis not present

## 2021-03-21 DIAGNOSIS — M545 Low back pain, unspecified: Secondary | ICD-10-CM

## 2021-03-21 NOTE — Progress Notes (Signed)
Office Visit Note   Patient: Casey [name]           Date of Birth: [birth year]           MRN: [id number] Visit Date: 03/21/2021              Requested by: Sharilyn Sites, [address],  [place] [address] PCP: Sharilyn Sites, MD   Assessment & Plan: Visit Diagnoses:  1. Acute pain of left shoulder   2. Chronic midline low back pain without sciatica     Plan: Mr. Nicolaou had an epidural steroid injection performed at [facility] imaging and relates that he is feeling "much better".  His pain was over 10 for the injection and now wants probably 0-1.  He is not having any referred pain in the lower extremity.  Long discussion regarding future treatment options and even repeat injection.  He simply needs to call and we can arrange that.  He also was evaluated for left shoulder pain after having a fall related to his balance.  He notes that he is feeling better as well in the shoulder and is now able to raise it over his head and feels like he is getting better to the point where nothing else needs to be done.  I am still concerned he may have some underlying pathology so I encouraged him to call me over the next month or so if he still having trouble we might want to consider an MRI scan  Follow-Up Instructions: Return if symptoms worsen or fail to improve.   Orders:  No orders of the defined types were placed in this encounter.  No orders of the defined types were placed in this encounter.     Procedures: No procedures performed   Clinical Data: No additional findings.   Subjective: Chief Complaint  Patient presents with   Left Shoulder - Follow-up   Lower Back - Follow-up  Patient presents today for follow up on his lower back and left shoulder. He states that after wearing the sling he has noticed improvement in his left shoulder. He is not taking anything for pain. He received a lower back injection at [facility] on 02/27/2021. He states that it has  helped a lot. He is able to do things that he was unable to do there for awhile.   HPI  Review of Systems   Objective: Vital Signs: There were no vitals taken for this visit.  Physical Exam Constitutional:      Appearance: He is well-developed.  Eyes:     Pupils: Pupils are equal, round, and reactive to light.  Pulmonary:     Effort: Pulmonary effort is normal.  Skin:    General: Skin is warm and dry.  Neurological:     Mental Status: He is alert and oriented to person, place, and time.  Psychiatric:        Behavior: Behavior normal.    Ortho Exam awake alert and oriented x3.  Comfortable sitting.  No pain with percussion lumbar spine.  Straight leg raise negative.  Painless range of motion both hips.  Walks without a limp or with any ambulatory aid.  Able to place left arm over his head with a slightly circuitous arc of motion.  Minimally positive impingement.  Biceps appears to be intact and.  Good strength.  Specialty Comments:  No specialty comments available.  Imaging: No results found.   PMFS History: Patient Active Problem List   Diagnosis Date  Noted   Pain in left shoulder 02/21/2021   Constipation 12/28/2018   N&V (nausea and vomiting) 12/28/2018   GERD (gastroesophageal reflux disease) 12/28/2018   Abdominal pain 12/28/2018   Weight loss, unintentional 12/28/2018   Atrial fibrillation with RVR ([place])    Sepsis ([place]) 123XX123   Acute metabolic encephalopathy 123XX123   Thrombocytopenia ([place]) 06/13/2018   Renal insufficiency 06/13/2018   Anxiety 06/12/2018   Bipolar disorder ([place]) 06/12/2018   Chronic anticoagulation 05/05/2017   Sleep apnea 05/05/2017   Edema leg 05/05/2017   Dysphasia 02/07/2017   Mild obesity 05/10/2016   History of colonic polyps    Diverticulosis of colon without hemorrhage    Hyperlipidemia LDL goal <130 05/16/2013   KNEE PAIN 08/07/2009   KNEE SPRAIN, ACUTE 08/07/2009   COLONIC POLYPS, HX OF 05/10/2009   Hypothyroidism  05/08/2009   History of bipolar disorder 05/08/2009   Essential hypertension 05/08/2009   Permanent atrial fibrillation 05/08/2009   HEMATOCHEZIA 05/08/2009   LOW BACK PAIN, CHRONIC 05/08/2009   ABDOMINAL PAIN 05/08/2009   Past Medical History:  Diagnosis Date   Arthritis    Atrial fibrillation ([place]) 01/12/2010   2D Echo EF=>55%   Bipolar disorder ([place])    Chronic back pain    Depression    Dysrhythmia    AFib   History of colonic polyps    History of gout    Hyperlipidemia    Hypertension    Hypothyroidism    Morbid obesity ([place])    OSA (obstructive sleep apnea)    AHI was 27.62hr RDI was 34.3 hr REM 0.00hr; had CPAP years ago but no longer uses.   Prostatitis    Tubular adenoma of colon 01/2016    Family History  Problem Relation Age of Onset   Diabetes Mother    Heart failure Mother    Hypertension Mother    Hypertension Father    Cancer Paternal Grandmother    Stroke Sister    Liver disease Brother    Vascular Disease Brother    Arthritis Brother    Colon cancer Neg Hx     Past Surgical History:  Procedure Laterality Date   APPENDECTOMY  1959   asthma     CATARACT EXTRACTION Bilateral 1995   COLONOSCOPY  2011   RMR: left-sided diverticula, minimal rectal friability. No polyps. Repeat 5 years.   COLONOSCOPY WITH PROPOFOL N/A 01/15/2016   Dr.Rourk- diverticulosis in the entire examined colon, multiple rectal and colonic polyps, internal hemorrhoids. bx= tubular adenomas and hyperplastic polyps.    HAND / FINGER LESION EXCISION Right    IR THORACENTESIS ASP PLEURAL SPACE W/IMG GUIDE  03/01/2019   IR THORACENTESIS ASP PLEURAL SPACE W/IMG GUIDE  03/31/2019   POLYPECTOMY  01/15/2016   Procedure: POLYPECTOMY;  Surgeon: Daneil Dolin, MD;  [facility];  Service: Endoscopy;;   right shoulder surgery     TONSILLECTOMY  1955   Social History   Occupational History   Not on file  Tobacco Use   Smoking status: Never   Smokeless tobacco: Never  Substance  and Sexual Activity   Alcohol use: No    Alcohol/week: 0.0 standard drinks   Drug use: No   Sexual activity: Yes    Birth control/protection: None

## 2021-03-21 NOTE — Telephone Encounter (Signed)
Please review

## 2021-03-21 NOTE — Telephone Encounter (Signed)
Pt's wife Casey [name] called.  Dr. Clovis Pu advised her to call back if she didn't see an improvement in her husband Casey [name] in 1 month. Casey [name] is not showing any improvement after 1 month.

## 2021-03-23 NOTE — Telephone Encounter (Signed)
Please call to find out what the symptoms are that he's having.  What are his problems?

## 2021-03-23 NOTE — Telephone Encounter (Signed)
Tried twice and went straight to vm LVM to return call

## 2021-03-26 NOTE — Telephone Encounter (Signed)
Thanks for the additional information.  That was very helpful.  I agree with his wife about reducing Mirapex from 1 tablet twice a day to half tablet twice a day for 1 week and if his symptoms are not resolved and stop the Mirapex.

## 2021-03-26 NOTE — Telephone Encounter (Signed)
Casey [name] stated Casey [name] has slightly improved since last week.He has not been able to sleep and was up at 2 am texting people in a disoriented state.His pcp put him on Ambien 10 mg and this has helped him sleep.However,Casey [name] stated he is still working him self too much.She said he is jittery and has to have something to do at all times.He does not want to slow down or get rest.She thinks he needs to cutback on the Mirapex as well.

## 2021-03-26 NOTE — Telephone Encounter (Signed)
Linda informed

## 2021-04-09 DIAGNOSIS — L02425 Furuncle of right lower limb: Secondary | ICD-10-CM | POA: Diagnosis not present

## 2021-04-09 DIAGNOSIS — L02426 Furuncle of left lower limb: Secondary | ICD-10-CM | POA: Diagnosis not present

## 2021-04-09 DIAGNOSIS — B9689 Other specified bacterial agents as the cause of diseases classified elsewhere: Secondary | ICD-10-CM | POA: Diagnosis not present

## 2021-04-09 DIAGNOSIS — I872 Venous insufficiency (chronic) (peripheral): Secondary | ICD-10-CM | POA: Diagnosis not present

## 2021-04-10 DIAGNOSIS — D6949 Other primary thrombocytopenia: Secondary | ICD-10-CM | POA: Diagnosis not present

## 2021-04-10 DIAGNOSIS — F319 Bipolar disorder, unspecified: Secondary | ICD-10-CM | POA: Diagnosis not present

## 2021-04-10 DIAGNOSIS — R600 Localized edema: Secondary | ICD-10-CM | POA: Diagnosis not present

## 2021-04-10 DIAGNOSIS — I872 Venous insufficiency (chronic) (peripheral): Secondary | ICD-10-CM | POA: Diagnosis not present

## 2021-04-16 DIAGNOSIS — F3162 Bipolar disorder, current episode mixed, moderate: Secondary | ICD-10-CM | POA: Diagnosis not present

## 2021-04-16 DIAGNOSIS — I872 Venous insufficiency (chronic) (peripheral): Secondary | ICD-10-CM | POA: Diagnosis not present

## 2021-04-16 DIAGNOSIS — F319 Bipolar disorder, unspecified: Secondary | ICD-10-CM | POA: Diagnosis not present

## 2021-04-16 DIAGNOSIS — R600 Localized edema: Secondary | ICD-10-CM | POA: Diagnosis not present

## 2021-04-16 DIAGNOSIS — Z23 Encounter for immunization: Secondary | ICD-10-CM | POA: Diagnosis not present

## 2021-04-18 ENCOUNTER — Telehealth: Payer: Self-pay | Admitting: Psychiatry

## 2021-04-18 ENCOUNTER — Other Ambulatory Visit: Payer: Self-pay | Admitting: Psychiatry

## 2021-04-18 NOTE — Telephone Encounter (Signed)
RTC  Patient is still manic after stopping Wellbutrin and pramipexole.  Wife's concern is hard to get him to settle down at night.  Reviewed with wife prior psychiatric medications which are multiple.  Typically one would use an antipsychotic in this case but she does not think that they worked in the past.  She says he has been on higher doses of Trileptal and tolerated it. Increase Trileptal to 600 mg every morning and 1200 mg nightly for mania.  Keep scheduled appointment.

## 2021-05-02 ENCOUNTER — Telehealth: Payer: Self-pay | Admitting: Psychiatry

## 2021-05-02 ENCOUNTER — Ambulatory Visit: Payer: Medicare Other | Admitting: Psychiatry

## 2021-05-02 NOTE — Telephone Encounter (Signed)
Pt had apt this morning. Provider canc. Wife [name] upset.  Pt is manic. [employer] @ [phone] or [phone].

## 2021-05-02 NOTE — Telephone Encounter (Signed)
You just spoke with her on Friday about Bralin. Next apt is not until Nov. Are you able to work him in or ?

## 2021-05-02 NOTE — Telephone Encounter (Signed)
We do not have any options but to use an antipsychotic and the most effective opton would be to add olanzapine to his Trileptal.  Call in olanzapine 10 mg tab and take 3 hour before bedtime #30.  Put him on cancellation list.  If his behavior becomes unmanageable have her take him to [facility]

## 2021-05-03 ENCOUNTER — Other Ambulatory Visit: Payer: Self-pay

## 2021-05-03 MED ORDER — OLANZAPINE 10 MG PO TABS: ORAL_TABLET | ORAL | 0 refills | Status: DC

## 2021-05-03 NOTE — Telephone Encounter (Signed)
Rtc to [place] and she reports she had to go back down to 2 of the Trileptal last night, Jacquise fell in the yard and got himself up. No injury, pt reports he's unsteady. Instructed her to add the Olanzapine 10 mg, she agreed. She also asks if he should continue the Zolpidem 10 mg at hs his PCP gave him. Pt was only averaging 2-3 hours of sleep at night. Now it's 5-6 hours but is interrupted. Informed her I would check with Dr. Clovis Pu and give her a call back.

## 2021-05-03 NOTE — Telephone Encounter (Signed)
Dr. Clovis Pu has a cancellation for Monday, 10/24, pt and wife will plan on coming to apt. She does understand changes and is to call back with further concerns or questions. She reports he's being hateful and that's very unusual also.

## 2021-05-03 NOTE — Telephone Encounter (Signed)
If he takes the olanzapine 2-3 hours before sleep then he should not need the Ambien.  Try reducing it to 5 mg.  Olanzapine should keep him asleep longer.  If it doesn't call back bc he might need more of it.

## 2021-05-07 ENCOUNTER — Telehealth: Payer: Self-pay

## 2021-05-07 ENCOUNTER — Ambulatory Visit: Payer: Medicare Other | Admitting: Psychiatry

## 2021-05-07 ENCOUNTER — Other Ambulatory Visit: Payer: Self-pay

## 2021-05-07 ENCOUNTER — Encounter: Payer: Self-pay | Admitting: Psychiatry

## 2021-05-07 DIAGNOSIS — F3110 Bipolar disorder, current episode manic without psychotic features, unspecified: Secondary | ICD-10-CM | POA: Diagnosis not present

## 2021-05-07 DIAGNOSIS — G251 Drug-induced tremor: Secondary | ICD-10-CM | POA: Diagnosis not present

## 2021-05-07 DIAGNOSIS — F411 Generalized anxiety disorder: Secondary | ICD-10-CM | POA: Diagnosis not present

## 2021-05-07 DIAGNOSIS — F5105 Insomnia due to other mental disorder: Secondary | ICD-10-CM

## 2021-05-07 DIAGNOSIS — R410 Disorientation, unspecified: Secondary | ICD-10-CM

## 2021-05-07 NOTE — Progress Notes (Signed)
Casey [name] [id number] [birth year] 75 y.o.    Subjective:   Patient ID:  Casey [name] is a 75 y.o. (DOB Jul 28, 1945) male.  Chief Complaint:  Chief Complaint  Patient presents with   Follow-up   Manic Behavior   Medication Reaction   Sleeping Problem    HPI seen with wife Casey [name] presents for follow-up of bipolar disorder and anxiety and history of confusion  At visit  Dec 02, 2018.  He was having confusion and benztropine was stopped to see if that was the cause.  Also it was having balance issues and therefore Trileptal was switched all to nighttime 1200 mg nightly. Confusion much better per both of them with DC benztropine.  Balance better with change Trileptal.   visit was January 13, 2019 and he was doing well as noted above and he was encouraged not to make further med changes at that time.  visit April 13, 2019.  He was doing better with regard to depression than he had in quite some time on the Wellbutrin and no meds were changed.  He was having health problems with pulmonary effusion and fluid removed twice related to fall in January 2020.  Last seen September 13, 2019.  For residual depression the following decisions were made: Balance problem much improved with dosing Trileptal all in the evening. Depression under partial control & wife wants it to be better per his wife.  No mood swings.   Increase bupropion XL to 2 of the 150 mg tablets in the morning until the current bottle is gone. Then new RX will be 1 of the 300 mg tablets each morning.  November 08, 2019 appointment the following is noted:  Seen with wife, Casey [name]. Small amount of improvement with change.  Anxiety got worse.  Notices when he goes out.  Worries about social and political things.  Wife keeps him active at home.  Allerigies bother him..  No sig caffeine except tea when goes out.  Wife sees him as a little more active and initiative.  More productive.  Started some cooking.  Getting out and up  better.  A little more talkative.   "He wants to be hyper but we know what happens". Wife Casey [name] thinks   But for the last 2 years he's done nothing and laid around all the time.  He's a lot more active now. Big improvement. Went to church first time in 2 years. Plan:  For TRD, Pramipexole 0.25 mg tablet 1/2 twice daily for 1 week then 1 twice daily.  12/20/2019 appointment with the following noted:  Seen with wife. A little better motivated to get out but not to get in crowds.  Wife agrees more motivated and went to church once alone without pressure.  Wife says he's more talkative and much  Better interest.  Still not motivated to do things like mow the grass.  Others's have seen a difference. Concentration and memory are improved but not normal. No Se pramipexole.  Getting out of bed earlier and better now.. Wife admisters meds bc earlier he messsed them up. Plan: Wife has seen a big improvement therefore Partial improvement is clear from this so increase Pramipexole 0.375 mg tablet  twice daily  Anxiety is not fully managed.  Partly related to depression. Tremor is manageable.    02/15/20 appt with the following noted:  Send with wife Casey [name] Doing good.  Increased pramipexole as indicated.  Tolerating meds Wellbutrin XL 300 mg AM &  Trileptal 1200 mg HS with it. Much better with depression.  Stays up more and speinding less time in bed.   Wife agrees he's much better and handling things better. 2 1/2 yo 4th cousin girl drowned in pond lately.  Big family. Wife pleased he handled it better. SE constipation ? Related. History Linzess but diarrhea. He's not highly motivated but it is better.  Depression is much improved.  Had been depressed for years. Plan no med changes  06/13/2020 appointment with the following noted: Pretty good but cardiologist noted today increased pulse needing treatment.  Uncontrolled afib even after ablation. Hard to tell about depression bc of fatigue Dt heart  problems. Wife said he did well Thanksgiving and participated more in conversation per wife. Plan: no med changes  09/06/2020 appointment with following noted: Remains on Wellbutrin XL 300 mg every morning, ginkgo biloba 40 mg twice daily, oxcarbazepine 1200 mg nightly, pramipexole 0.75 mg twice daily. Less sig health concerns. He thinks mood has been pretty good.  Consistent with meds. Wife thinks he stays tired and sleepy and wonders if can give him a stimulant.   Sleep good.  Will nap.  Doesn't bother him.  Walks dog 20 min in AM.  Not much initiative per wife.  Not depressed but when depressed he stayed in the bed lasting a couple of years.  History of OSA but lost weight and doesn't think he has apnea anymore.  No CPAP. Chronic afib. Not much caffeine. Plan: Wife& patient has seen a big improvement with Pramipexole 0.375 mg tablet  twice daily They ask consideration be given to the following: If his energy gets better by treating his tachycardia they would like to consider reducing the pramipexole or possibly eliminating it at follow-up.   For energy trial reduction pramipexole to 0.25 mg BID.  If gets more depressed call. Alternative of modafinil 100  12/04/2020 appointment with the following noted: Energy is better with awakening earlier at 77 and doing more and getting out more.  Going to bed later with 8 hour  Sleep. Mood fluctuates with more reacting to wife mainly.  More outspoken.  Per W he has to vent.  She thinks he is too focused on politics and issues.   Plan: Depression under control.  No mood swings.   Continue bupropion XL 300 mg tablets each morning. Continue Trileptal 600 mg 2 at HS. No higher DT anxiety risk and no traditional stimulants DT afib.  Wife & patient has seen a big improvement with Pramipexole and able to reduce to  0.25 mg tablet  twice daily  02/20/2021 appointment with the following noted:  seen with wife Prednisone made him real hyper.  Been off of it for  at least a couple weeks.  Hyperverbal and hyperactive.  Hip pain interferes with sleep.  Sees doctor tomorrow. Average 3-4 hours at night and no naps bc busy.  No excessive spending but more than usual.  Denies appetite disturbance.  Patient reports that energy and motivation have been reduced.  Patient has difficulty with concentration and memory.  Patient denies any suicidal ideation.  W had MI in June. A/P: mild mania Wean Wellbutrin over 2 weeks. Continue Trileptal 600 mg 2 at HS. Wife & patient has seen a big improvement with Pramipexole and able to reduce to  0.25 mg tablet  twice daily. Continue it unless mania does not resolve with less bupropion.  03/26/2021 phone call: Casey [name] stated Casey [name] has slightly improved since last week.He has not  been able to sleep and was up at 2 am texting people in a disoriented state.His pcp put him on Ambien 10 mg and this has helped him sleep.However,linda stated he is still working him self too much.She said he is jittery and has to have something to do at all times.He does not want to slow down or get rest.She thinks he needs to cutback on the Mirapex as well. MD response:  I agree with his wife about reducing Mirapex from 1 tablet twice a day to half tablet twice a day for 1 week and if his symptoms are not resolved and stop the Mirapex.  04/18/2021 phone call with wife and MD: Patient is still manic after stopping Wellbutrin and pramipexole.  Wife's concern is hard to get him to settle down at night.  Reviewed with wife prior psychiatric medications which are multiple.  Typically one would use an antipsychotic in this case but she does not think that they worked in the past.  She says he has been on higher doses of Trileptal and tolerated it. Increase Trileptal to 600 mg every morning and 1200 mg nightly for mania.  Keep scheduled appointment.  05/02/2021 wife reports patient still manic: We cannot go higher in the Trileptal because of balance issues.  Therefore  olanzapine 10 mg nightly was started and we discussed the option of hospitalization.  We will try to get the patient in urgently to an appointment.  05/07/2021 appointment with the following noted: seen with wife, Casey [name] Not doing too well.  Trouble getting out of bed.  Wife says he's still up in middle of the night but he says he's sleeping better.  Sleep in recliner downstairs. She says he's still manic with hyperactive, hyper graphia but won't show her what he's writing. On olanzapine  No changes off Wellbutrin and pramipexole. Apparently more balance problems with increase Trileptal to 1800 mg daily.  Dropped back to 1200 mg HS. Admits to some confusion.  Has pulled off labels on bottle per wife. She thinks he's averaging 4-6 hours of sleep and a little better than it was. Wife said he started cussing and he has not done that in the past. Other episodes of inappropriate decision making.  He recognizes he's manic but can't control himself.  Hyperverbal, pressured.  In past had speech problems with mania also.  Neg neuro work up for stroke in the past. SE balance and tremor. Some trouble getting out of chair?  Past Psychiatric Medication Trials: Seroquel 300, olanzapine, perphenazine, risperidone,  Lithium helped but SE kidneys and prostate,  Latuda,  Abilify,  alprazolam,  Depakote, Trileptal, carbamazepine, lamotrigine caused nausea, citalopram, sertraline 150,   Wellbutrin 300 anxiety,  Pramipexole 0.375 BID Benztropine confusion        1 prior psych hosp depression                                                    Review of Systems:  Review of Systems  Constitutional:  Positive for fatigue.  Eyes:  Positive for visual disturbance.  Gastrointestinal:  Positive for constipation.  Musculoskeletal:  Positive for arthralgias and gait problem.  Skin:  Negative for rash.       chronic  Neurological:  Positive for tremors and weakness.          Psychiatric/Behavioral:  Positive for  agitation. Negative for behavioral problems, confusion, decreased concentration, dysphoric mood, hallucinations, self-injury, sleep disturbance and suicidal ideas. The patient is nervous/anxious and is hyperactive.    Medications: I have reviewed the patient's current medications.  Current Outpatient Medications  Medication Sig Dispense Refill   acetaminophen (TYLENOL) 500 MG tablet Take 1,000 mg by mouth every 6 (six) hours as needed for headache.     Albuterol Sulfate 108 (90 Base) MCG/ACT AEPB Inhale 1-2 puffs into the lungs as needed.     ALPRAZolam (XANAX) 0.5 MG tablet Take 0.5 mg by mouth at bedtime as needed for anxiety.     bisacodyl (DULCOLAX) 5 MG EC tablet Take 5-15 mg by mouth daily as needed for moderate constipation.     cholecalciferol (VITAMIN D) 1000 UNITS tablet Take 2,000 Units by mouth daily.     clotrimazole (LOTRIMIN) 1 % cream Apply 1 application topically 2 (two) times daily.     colchicine 0.6 MG tablet      fluticasone (FLONASE) 50 MCG/ACT nasal spray Place 2 sprays into both nostrils daily.  11   furosemide (LASIX) 20 MG tablet Take 20 mg by mouth 2 (two) times daily.     gabapentin (NEURONTIN) 100 MG capsule Take 100 mg by mouth 3 (three) times daily.     Ginkgo Biloba 40 MG TABS Take 40 mg by mouth 2 (two) times daily.     linaclotide (LINZESS) 72 MCG capsule Take 1 capsule (72 mcg total) by mouth daily before breakfast. 30 capsule 2   OLANZapine (ZYPREXA) 10 MG tablet Take 1 tablet (10 mg) by mouth 3 hours prior to bedtime. 30 tablet 0   oxcarbazepine (TRILEPTAL) 600 MG tablet Take 2 tablets (1,200 mg total) by mouth at bedtime. 180 tablet 1   pantoprazole (PROTONIX) 40 MG tablet TAKE ONE TABLET BY MOUTH ONCE DAILY 30 tablet 11   rivaroxaban (XARELTO) 20 MG TABS tablet Take 20 mg by mouth daily after supper.      SUPER B COMPLEX/C PO Take by mouth.     buPROPion (WELLBUTRIN XL) 150 MG 24 hr tablet Stop Wellbutrin 300 mg.  Take 1 of the 150 mg each AM for 2 weeks  and then stop it. (Patient not taking: Reported on 05/07/2021) 15 tablet 0   levothyroxine (SYNTHROID, LEVOTHROID) 25 MCG tablet Take 25 mcg by mouth daily before breakfast.  (Patient not taking: Reported on 05/07/2021)     metoprolol succinate (TOPROL-XL) 50 MG 24 hr tablet Take 1 tablet (50 mg total) by mouth daily. Take with or immediately following a meal. (Patient not taking: Reported on 09/06/2020) 90 tablet 3   pramipexole (MIRAPEX) 0.25 MG tablet Take 1 tablet (0.25 mg total) by mouth 2 (two) times daily. (Patient not taking: Reported on 05/07/2021) 180 tablet 1   No current facility-administered medications for this visit.    Medication Side Effects: resolved  Allergies:  Allergies  Allergen Reactions   Amlodipine Other (See Comments)    Stopped due to kidney issues   Vicodin [Hydrocodone-Acetaminophen] Other (See Comments)    Patient is unsure of reaction    Past Medical History:  Diagnosis Date   Arthritis    Atrial fibrillation ([place]) 01/12/2010   2D Echo EF=>55%   Bipolar disorder ([facility])    Chronic back pain    Depression    Dysrhythmia    AFib   History of colonic polyps    History of gout    Hyperlipidemia    Hypertension    Hypothyroidism  Morbid obesity ([facility])    OSA (obstructive sleep apnea)    AHI was 27.62hr RDI was 34.3 hr REM 0.00hr; had CPAP years ago but no longer uses.   Prostatitis    Tubular adenoma of colon 01/2016    Family History  Problem Relation Age of Onset   Diabetes Mother    Heart failure Mother    Hypertension Mother    Hypertension Father    Cancer Paternal Grandmother    Stroke Sister    Liver disease Brother    Vascular Disease Brother    Arthritis Brother    Colon cancer Neg Hx     Social History   Socioeconomic History   Marital status: Married    Spouse name: Not on file   Number of children: Not on file   Years of education: Not on file   Highest education level: Not on file  Occupational History   Not on file   Tobacco Use   Smoking status: Never   Smokeless tobacco: Never  Substance and Sexual Activity   Alcohol use: No    Alcohol/week: 0.0 standard drinks   Drug use: No   Sexual activity: Yes    Birth control/protection: None  Other Topics Concern   Not on file  Social History Narrative   Not on file   Social Determinants of Health   Financial Resource Strain: Not on file  Food Insecurity: Not on file  Transportation Needs: Not on file  Physical Activity: Not on file  Stress: Not on file  Social Connections: Not on file  Intimate Partner Violence: Not on file    Past Medical History, Surgical history, Social history, and Family history were reviewed and updated as appropriate.   Please see review of systems for further details on the patient's review from today.   Objective:   Physical Exam:  There were no vitals taken for this visit.  Physical Exam Constitutional:      General: He is not in acute distress. Musculoskeletal:        General: No deformity.  Neurological:     Mental Status: He is alert and oriented to person, place, and time.     Coordination: Coordination normal.  Psychiatric:        Attention and Perception: He is attentive. He does not perceive auditory hallucinations.        Mood and Affect: Mood is not anxious or depressed. Affect is not labile, blunt, angry or inappropriate.        Speech: Speech is rapid and pressured and tangential.        Behavior: Behavior normal.        Thought Content: Thought content normal. Thought content does not include homicidal or suicidal ideation. Thought content does not include homicidal or suicidal plan.        Cognition and Memory: Cognition normal.        Judgment: Judgment normal.     Comments: Insight intact. No auditory or visual hallucinations. No delusions.  Mania worse than last time. Pressured speech. Emotional lability    Lab Review:     Component Value Date/Time   NA 141 12/28/2018 0941   K 4.0  12/28/2018 0941   CL 104 12/28/2018 0941   CO2 22 12/28/2018 0941   GLUCOSE 88 12/28/2018 0941   GLUCOSE 111 (H) 06/30/2018 1311   BUN 16 12/28/2018 0941   CREATININE 1.30 (H) 12/28/2018 0941   CALCIUM 9.4 12/28/2018 0941   PROT 6.6 12/28/2018  0941   ALBUMIN 4.5 12/28/2018 0941   AST 21 12/28/2018 0941   ALT 22 12/28/2018 0941   ALKPHOS 115 12/28/2018 0941   BILITOT 0.7 12/28/2018 0941   GFRNONAA 55 (L) 12/28/2018 0941   GFRAA 63 12/28/2018 0941       Component Value Date/Time   WBC 8.1 12/28/2018 0941   WBC 5.4 06/30/2018 1311   RBC 4.81 12/28/2018 0941   RBC 4.52 06/30/2018 1311   HGB 15.2 12/28/2018 0941   HCT 42.9 12/28/2018 0941   PLT 186 12/28/2018 0941   MCV 89 12/28/2018 0941   MCH 31.6 12/28/2018 0941   MCH 31.9 06/30/2018 1311   MCHC 35.4 12/28/2018 0941   MCHC 32.8 06/30/2018 1311   RDW 12.9 12/28/2018 0941   LYMPHSABS 1.1 12/28/2018 0941   MONOABS 0.5 06/30/2018 1311   EOSABS 0.1 12/28/2018 0941   BASOSABS 0.0 12/28/2018 0941    No results found for: POCLITH, LITHIUM   No results found for: PHENYTOIN, PHENOBARB, VALPROATE, CBMZ   .res Assessment: Plan:    Casey [name] was seen today for follow-up, manic behavior, medication reaction and sleeping problem.  Diagnoses and all orders for this visit:  Bipolar I disorder, most recent episode (or current) manic ([place])  Generalized anxiety disorder  Subacute confusional state  Tremor due to multiple drugs  Insomnia due to mental condition  History pseudodementia with depression which is resolved.  Dx bipolar at 75 yo.  Bipolar depression has flipped into mania from prednisone.  Patient is very complicated because of weakness requiring a walker and medical problems as well as ongoing mania.  Disc this at length and risk of crashing into depression if we can't ease him down.      Prednisone triggered some mania in last few weeks and persisted off prednisone.   Ongoing mania no better and maybe worse.  May  require hospitalization bc of the problems noted and difficulty getting anti-manic meds adjusted quickly.  Need better control mania but all anti-manic meds have fall risk.  There is no treatment option without significant risk at this point.  If we pick a medicine with low sedation risk then he will have to have a sleeping pill which exposes him to fall risk.  We discussed the pros and cons of options.  Sleeping better with olanzapine but some tremor..   Disc difficulty finding anti manic med with benefit for sleep but low EPS and fall risk. Consider Saphris, loxapine, retry quetiapine, increase olanzapine.  Increase olanzapine to 15 mg PM They agree with the plan.  Tremor is manageable.  Discussed the that excess caffeine with Wellbutrin could exacerbate it. Balance problem much improved with dosing Trileptal all in the evening 1200 mg HS.    Minimize benzodiazepine because of the possible side effect of confusion.  At this time he is taking it rarely.  We discussed the short-term risks associated with benzodiazepines including sedation and increased fall risk among others.  Discussed long-term side effect risk including dependence, potential withdrawal symptoms, and the potential eventual dose-related risk of dementia. Alprazolam failed for sleep  Discussed side effect of each medication  Discussed the severity of his symptoms and the potential need for hospitalization in order to get the right balance between benefit and side effects.  On an outpatient setting we can only make changes once a week at best.  With inpatient setting changes could be made more quickly and side effects identified more quickly as well.  We will attempt to  continue outpatient treatment as long as possible.  Follow-up Friday  This was a 30-minute appointment  Lynder Parents, MD, DFAPA   Please see After Visit Summary for patient specific instructions.  Future Appointments  Date Time Provider [place]   05/22/2021  8:00 AM Mahala Menghini, PA-C [facility]  06/04/2021 11:00 AM Cottle, Billey Co., MD [facility] None  06/21/2021 10:20 AM Croitoru, Dani Gobble, MD [facility]    No orders of the defined types were placed in this encounter.     -------------------------------

## 2021-05-07 NOTE — Telephone Encounter (Signed)
Prior Authorization submitted and approved for OLANZAPINE 10 MG effective 05/03/2021-05/03/2022 with [employer] Medicare Part D

## 2021-05-11 ENCOUNTER — Ambulatory Visit: Payer: Medicare Other | Admitting: Psychiatry

## 2021-05-11 ENCOUNTER — Encounter: Payer: Self-pay | Admitting: Psychiatry

## 2021-05-11 ENCOUNTER — Other Ambulatory Visit: Payer: Self-pay

## 2021-05-11 DIAGNOSIS — F3110 Bipolar disorder, current episode manic without psychotic features, unspecified: Secondary | ICD-10-CM

## 2021-05-11 DIAGNOSIS — G251 Drug-induced tremor: Secondary | ICD-10-CM | POA: Diagnosis not present

## 2021-05-11 DIAGNOSIS — F5105 Insomnia due to other mental disorder: Secondary | ICD-10-CM | POA: Diagnosis not present

## 2021-05-11 DIAGNOSIS — F411 Generalized anxiety disorder: Secondary | ICD-10-CM

## 2021-05-11 MED ORDER — OLANZAPINE 20 MG PO TABS: ORAL_TABLET | ORAL | 0 refills | Status: DC

## 2021-05-11 NOTE — Patient Instructions (Signed)
Increase olanzapine to 20 mg in evening

## 2021-05-11 NOTE — Progress Notes (Signed)
BENICIO MANNA [id number] October 19, 1945 75 y.o.    Subjective:   Patient ID:  Casey [name] is a 75 y.o. (DOB 02-Jan-1946) male.  Chief Complaint:  Chief Complaint  Patient presents with   Follow-up   Bipolar I disorder, most recent episode (or current) manic    Manic Behavior   Sleeping Problem    HPI seen with wife Brownie Nehme presents for follow-up of bipolar disorder and anxiety and history of confusion  At visit  Dec 02, 2018.  He was having confusion and benztropine was stopped to see if that was the cause.  Also it was having balance issues and therefore Trileptal was switched all to nighttime 1200 mg nightly. Confusion much better per both of them with DC benztropine.  Balance better with change Trileptal.   visit was January 13, 2019 and he was doing well as noted above and he was encouraged not to make further med changes at that time.  visit April 13, 2019.  He was doing better with regard to depression than he had in quite some time on the Wellbutrin and no meds were changed.  He was having health problems with pulmonary effusion and fluid removed twice related to fall in January 2020.  Last seen September 13, 2019.  For residual depression the following decisions were made: Balance problem much improved with dosing Trileptal all in the evening. Depression under partial control & wife wants it to be better per his wife.  No mood swings.   Increase bupropion XL to 2 of the 150 mg tablets in the morning until the current bottle is gone. Then new RX will be 1 of the 300 mg tablets each morning.  November 08, 2019 appointment the following is noted:  Seen with wife, [name]. Small amount of improvement with change.  Anxiety got worse.  Notices when he goes out.  Worries about social and political things.  Wife keeps him active at home.  Allerigies bother him..  No sig caffeine except tea when goes out.  Wife sees him as a little more active and initiative.  More productive.   Started some cooking.  Getting out and up better.  A little more talkative.   "He wants to be hyper but we know what happens". Wife [name] thinks   But for the last 2 years he's done nothing and laid around all the time.  He's a lot more active now. Big improvement. Went to church first time in 2 years. Plan:  For TRD, Pramipexole 0.25 mg tablet 1/2 twice daily for 1 week then 1 twice daily.  12/20/2019 appointment with the following noted:  Seen with wife. A little better motivated to get out but not to get in crowds.  Wife agrees more motivated and went to church once alone without pressure.  Wife says he's more talkative and much  Better interest.  Still not motivated to do things like mow the grass.  Others's have seen a difference. Concentration and memory are improved but not normal. No Se pramipexole.  Getting out of bed earlier and better now.. Wife admisters meds bc earlier he messsed them up. Plan: Wife has seen a big improvement therefore Partial improvement is clear from this so increase Pramipexole 0.375 mg tablet  twice daily  Anxiety is not fully managed.  Partly related to depression. Tremor is manageable.    02/15/20 appt with the following noted:  Send with wife [name] Doing good.  Increased pramipexole as indicated.  Tolerating meds Wellbutrin XL 300 mg AM & Trileptal 1200 mg HS with it. Much better with depression.  Stays up more and speinding less time in bed.   Wife agrees he's much better and handling things better. 2 1/2 yo 4th cousin girl drowned in pond lately.  Big family. Wife pleased he handled it better. SE constipation ? Related. History Linzess but diarrhea. He's not highly motivated but it is better.  Depression is much improved.  Had been depressed for years. Plan no med changes  06/13/2020 appointment with the following noted: Pretty good but cardiologist noted today increased pulse needing treatment.  Uncontrolled afib even after ablation. Hard to tell about  depression bc of fatigue Dt heart problems. Wife said he did well Thanksgiving and participated more in conversation per wife. Plan: no med changes  09/06/2020 appointment with following noted: Remains on Wellbutrin XL 300 mg every morning, ginkgo biloba 40 mg twice daily, oxcarbazepine 1200 mg nightly, pramipexole 0.75 mg twice daily. Less sig health concerns. He thinks mood has been pretty good.  Consistent with meds. Wife thinks he stays tired and sleepy and wonders if can give him a stimulant.   Sleep good.  Will nap.  Doesn't bother him.  Walks dog 20 min in AM.  Not much initiative per wife.  Not depressed but when depressed he stayed in the bed lasting a couple of years.  History of OSA but lost weight and doesn't think he has apnea anymore.  No CPAP. Chronic afib. Not much caffeine. Plan: Wife& patient has seen a big improvement with Pramipexole 0.375 mg tablet  twice daily They ask consideration be given to the following: If his energy gets better by treating his tachycardia they would like to consider reducing the pramipexole or possibly eliminating it at follow-up.   For energy trial reduction pramipexole to 0.25 mg BID.  If gets more depressed call. Alternative of modafinil 100  12/04/2020 appointment with the following noted: Energy is better with awakening earlier at 71 and doing more and getting out more.  Going to bed later with 8 hour  Sleep. Mood fluctuates with more reacting to wife mainly.  More outspoken.  Per W he has to vent.  She thinks he is too focused on politics and issues.   Plan: Depression under control.  No mood swings.   Continue bupropion XL 300 mg tablets each morning. Continue Trileptal 600 mg 2 at HS. No higher DT anxiety risk and no traditional stimulants DT afib.  Wife & patient has seen a big improvement with Pramipexole and able to reduce to  0.25 mg tablet  twice daily  02/20/2021 appointment with the following noted:  seen with wife Prednisone made  him real hyper.  Been off of it for at least a couple weeks.  Hyperverbal and hyperactive.  Hip pain interferes with sleep.  Sees doctor tomorrow. Average 3-4 hours at night and no naps bc busy.  No excessive spending but more than usual.  Denies appetite disturbance.  Patient reports that energy and motivation have been reduced.  Patient has difficulty with concentration and memory.  Patient denies any suicidal ideation.  W had MI in June. A/P: mild mania Wean Wellbutrin over 2 weeks. Continue Trileptal 600 mg 2 at HS. Wife & patient has seen a big improvement with Pramipexole and able to reduce to  0.25 mg tablet  twice daily. Continue it unless mania does not resolve with less bupropion.  03/26/2021 phone call: [name] stated Ridhaan  has slightly improved since last week.He has not been able to sleep and was up at 2 am texting people in a disoriented state.His pcp put him on Ambien 10 mg and this has helped him sleep.However,linda stated he is still working him self too much.She said he is jittery and has to have something to do at all times.He does not want to slow down or get rest.She thinks he needs to cutback on the Mirapex as well. MD response:  I agree with his wife about reducing Mirapex from 1 tablet twice a day to half tablet twice a day for 1 week and if his symptoms are not resolved and stop the Mirapex.  04/18/2021 phone call with wife and MD: Patient is still manic after stopping Wellbutrin and pramipexole.  Wife's concern is hard to get him to settle down at night.  Reviewed with wife prior psychiatric medications which are multiple.  Typically one would use an antipsychotic in this case but she does not think that they worked in the past.  She says he has been on higher doses of Trileptal and tolerated it. Increase Trileptal to 600 mg every morning and 1200 mg nightly for mania.  Keep scheduled appointment.  05/02/2021 wife reports patient still manic: We cannot go higher in the Trileptal  because of balance issues.  Therefore olanzapine 10 mg nightly was started and we discussed the option of hospitalization.  We will try to get the patient in urgently to an appointment.  05/07/2021 appointment with the following noted: seen with wife, [name] Not doing too well.  Trouble getting out of bed.  Wife says he's still up in middle of the night but he says he's sleeping better.  Sleep in recliner downstairs. She says he's still manic with hyperactive, hyper graphia but won't show her what he's writing. On olanzapine  No changes off Wellbutrin and pramipexole. Apparently more balance problems with increase Trileptal to 1800 mg daily.  Dropped back to 1200 mg HS. Admits to some confusion.  Has pulled off labels on bottle per wife. She thinks he's averaging 4-6 hours of sleep and a little better than it was. Wife said he started cussing and he has not done that in the past. Other episodes of inappropriate decision making.  He recognizes he's manic but can't control himself.  Hyperverbal, pressured.  In past had speech problems with mania also.  Neg neuro work up for stroke in the past. SE balance and tremor. Some trouble getting out of chair? Plan: For mania,Increase olanzapine to 15 mg PM Follow-up Friday for urgent visit  05/11/2021 appointment with the following noted: Calming down a little. He thinks he's sleeping better.  She says he needs whole Ambien with olanzapine and last night still not sleeping.  He woke her with complaints of back pain. She's not seeing SE and nor is he.  She's not seeing much change  He says back pain is complicating his sleep and his problem. He tries to stay active.   She says he talks incessantly but not as much to her. She says major life events have triggered mania in him in the past. She notices him making poor decisions.  Past Psychiatric Medication Trials: Seroquel 300, olanzapine, perphenazine, risperidone,  Lithium helped but SE kidneys and  prostate,  Latuda,  Abilify,  alprazolam,  Depakote, Trileptal, carbamazepine, lamotrigine caused nausea, citalopram, sertraline 150,   Wellbutrin 300 anxiety,  Pramipexole 0.375 BID Benztropine confusion        1  prior psych hosp depression                                                    Review of Systems:  Review of Systems  Constitutional:  Positive for fatigue.  Eyes:  Positive for visual disturbance.  Gastrointestinal:  Positive for constipation.  Musculoskeletal:  Positive for arthralgias, back pain and gait problem.  Skin:  Negative for rash.       chronic  Neurological:  Positive for tremors and weakness.          Psychiatric/Behavioral:  Positive for agitation. Negative for behavioral problems, confusion, decreased concentration, dysphoric mood, hallucinations, self-injury, sleep disturbance and suicidal ideas. The patient is nervous/anxious and is hyperactive.    Medications: I have reviewed the patient's current medications.  Current Outpatient Medications  Medication Sig Dispense Refill   acetaminophen (TYLENOL) 500 MG tablet Take 1,000 mg by mouth every 6 (six) hours as needed for headache.     Albuterol Sulfate 108 (90 Base) MCG/ACT AEPB Inhale 1-2 puffs into the lungs as needed.     ALPRAZolam (XANAX) 0.5 MG tablet Take 0.5 mg by mouth at bedtime as needed for anxiety.     bisacodyl (DULCOLAX) 5 MG EC tablet Take 5-15 mg by mouth daily as needed for moderate constipation.     cholecalciferol (VITAMIN D) 1000 UNITS tablet Take 2,000 Units by mouth daily.     clotrimazole (LOTRIMIN) 1 % cream Apply 1 application topically 2 (two) times daily.     fluticasone (FLONASE) 50 MCG/ACT nasal spray Place 2 sprays into both nostrils daily as needed.  11   furosemide (LASIX) 20 MG tablet Take 20 mg by mouth 2 (two) times daily.     Ginkgo Biloba 40 MG TABS Take 40 mg by mouth 2 (two) times daily.     levothyroxine (SYNTHROID, LEVOTHROID) 25 MCG tablet Take 25 mcg by mouth  daily before breakfast.     linaclotide (LINZESS) 72 MCG capsule Take 1 capsule (72 mcg total) by mouth daily before breakfast. 30 capsule 2   metoprolol succinate (TOPROL-XL) 50 MG 24 hr tablet Take 23 mg by mouth daily. Take with or immediately following a meal. Takes 1/2 tablet.     oxcarbazepine (TRILEPTAL) 600 MG tablet Take 2 tablets (1,200 mg total) by mouth at bedtime. 180 tablet 1   pantoprazole (PROTONIX) 40 MG tablet TAKE ONE TABLET BY MOUTH ONCE DAILY 30 tablet 11   pramipexole (MIRAPEX) 0.25 MG tablet Take 1 tablet (0.25 mg total) by mouth 2 (two) times daily. 180 tablet 1   rivaroxaban (XARELTO) 20 MG TABS tablet Take 20 mg by mouth daily after supper.      traMADol-acetaminophen (ULTRACET) 37.5-325 MG tablet Take 1-2 tablets by mouth every 4 (four) hours as needed. 1-2 tabs     gabapentin (NEURONTIN) 100 MG capsule Take 100 mg by mouth 3 (three) times daily. (Patient not taking: Reported on 05/11/2021)     OLANZapine (ZYPREXA) 20 MG tablet Take 1 tablet  by mouth 3 hours prior to bedtime. 30 tablet 0   SUPER B COMPLEX/C PO Take by mouth daily.     No current facility-administered medications for this visit.    Medication Side Effects: resolved  Allergies:  Allergies  Allergen Reactions   Amlodipine Other (See Comments)    Stopped due to  kidney issues   Vicodin [Hydrocodone-Acetaminophen] Other (See Comments)    Patient is unsure of reaction    Past Medical History:  Diagnosis Date   Arthritis    Atrial fibrillation ([place]) 01/12/2010   2D Echo EF=>55%   Bipolar disorder (HCC)    Chronic back pain    Depression    Dysrhythmia    AFib   History of colonic polyps    History of gout    Hyperlipidemia    Hypertension    Hypothyroidism    Morbid obesity ([place])    OSA (obstructive sleep apnea)    AHI was 27.62hr RDI was 34.3 hr REM 0.00hr; had CPAP years ago but no longer uses.   Prostatitis    Tubular adenoma of colon 01/2016    Family History  Problem Relation  Age of Onset   Diabetes Mother    Heart failure Mother    Hypertension Mother    Hypertension Father    Cancer Paternal Grandmother    Stroke Sister    Liver disease Brother    Vascular Disease Brother    Arthritis Brother    Colon cancer Neg Hx     Social History   Socioeconomic History   Marital status: Married    Spouse name: Not on file   Number of children: Not on file   Years of education: Not on file   Highest education level: Not on file  Occupational History   Not on file  Tobacco Use   Smoking status: Never   Smokeless tobacco: Never  Substance and Sexual Activity   Alcohol use: No    Alcohol/week: 0.0 standard drinks   Drug use: No   Sexual activity: Yes    Birth control/protection: None  Other Topics Concern   Not on file  Social History Narrative   Not on file   Social Determinants of Health   Financial Resource Strain: Not on file  Food Insecurity: Not on file  Transportation Needs: Not on file  Physical Activity: Not on file  Stress: Not on file  Social Connections: Not on file  Intimate Partner Violence: Not on file    Past Medical History, Surgical history, Social history, and Family history were reviewed and updated as appropriate.   Please see review of systems for further details on the patient's review from today.   Objective:   Physical Exam:  There were no vitals taken for this visit.  Physical Exam Constitutional:      General: He is not in acute distress. Musculoskeletal:        General: No deformity.  Neurological:     Mental Status: He is alert and oriented to person, place, and time.     Coordination: Coordination normal.  Psychiatric:        Attention and Perception: He is attentive. He does not perceive auditory hallucinations.        Mood and Affect: Mood is not anxious or depressed. Affect is not labile, blunt, angry or inappropriate.        Speech: Speech is rapid and pressured and tangential.        Behavior:  Behavior normal.        Thought Content: Thought content normal. Thought content does not include homicidal or suicidal ideation. Thought content does not include homicidal or suicidal plan.        Cognition and Memory: Cognition normal.        Judgment: Judgment normal.     Comments: Insight  intact. No auditory or visual hallucinations. No delusions.  Mania similar to Monday Pressured speech. Emotional lability    Lab Review:     Component Value Date/Time   NA 141 12/28/2018 0941   K 4.0 12/28/2018 0941   CL 104 12/28/2018 0941   CO2 22 12/28/2018 0941   GLUCOSE 88 12/28/2018 0941   GLUCOSE 111 (H) 06/30/2018 1311   BUN 16 12/28/2018 0941   CREATININE 1.30 (H) 12/28/2018 0941   CALCIUM 9.4 12/28/2018 0941   PROT 6.6 12/28/2018 0941   ALBUMIN 4.5 12/28/2018 0941   AST 21 12/28/2018 0941   ALT 22 12/28/2018 0941   ALKPHOS 115 12/28/2018 0941   BILITOT 0.7 12/28/2018 0941   GFRNONAA 55 (L) 12/28/2018 0941   GFRAA 63 12/28/2018 0941       Component Value Date/Time   WBC 8.1 12/28/2018 0941   WBC 5.4 06/30/2018 1311   RBC 4.81 12/28/2018 0941   RBC 4.52 06/30/2018 1311   HGB 15.2 12/28/2018 0941   HCT 42.9 12/28/2018 0941   PLT 186 12/28/2018 0941   MCV 89 12/28/2018 0941   MCH 31.6 12/28/2018 0941   MCH 31.9 06/30/2018 1311   MCHC 35.4 12/28/2018 0941   MCHC 32.8 06/30/2018 1311   RDW 12.9 12/28/2018 0941   LYMPHSABS 1.1 12/28/2018 0941   MONOABS 0.5 06/30/2018 1311   EOSABS 0.1 12/28/2018 0941   BASOSABS 0.0 12/28/2018 0941    No results found for: POCLITH, LITHIUM   No results found for: PHENYTOIN, PHENOBARB, VALPROATE, CBMZ   .res Assessment: Plan:    Jobie was seen today for follow-up, bipolar i disorder, most recent episode (or current) manic , manic behavior and sleeping problem.  Diagnoses and all orders for this visit:  Bipolar I disorder, most recent episode (or current) manic (HCC) -     OLANZapine (ZYPREXA) 20 MG tablet; Take 1 tablet  by mouth  3 hours prior to bedtime.  Generalized anxiety disorder  Tremor due to multiple drugs  Insomnia due to mental condition  History pseudodementia with depression which is resolved.  Dx bipolar at 75 yo.  Bipolar depression has flipped into mania from prednisone.  Patient is very complicated because of weakness requiring a walker and medical problems as well as ongoing mania.  Disc this at length and risk of crashing into depression if we can't ease him down.      Prednisone triggered some mania in last few weeks and persisted off prednisone.   Ongoing mania no better and maybe worse.  May require hospitalization bc of the problems noted and difficulty getting anti-manic meds adjusted quickly.  Need better control mania but all anti-manic meds have fall risk.  There is no treatment option without significant risk at this point.  If we pick a medicine with low sedation risk then he will have to have a sleeping pill which exposes him to fall risk.  We discussed the pros and cons of options.  Sleeping better with olanzapine but some tremor..   Disc difficulty finding anti manic med with benefit for sleep but low EPS and fall risk. Consider Saphris, loxapine, retry quetiapine, increase olanzapine.  Tolerated the last increase olanzapine. Increase olanzapine to 20 mg PM They agree with the plan.  Tremor is manageable.  Discussed the that excess caffeine with Wellbutrin could exacerbate it. Balance problem much improved with dosing Trileptal all in the evening 1200 mg HS.    Minimize benzodiazepine because of the possible side effect of  confusion.  At this time he is taking it rarely.  We discussed the short-term risks associated with benzodiazepines including sedation and increased fall risk among others.  Discussed long-term side effect risk including dependence, potential withdrawal symptoms, and the potential eventual dose-related risk of dementia. Alprazolam failed for sleep  Discussed side  effect of each medication  Discussed the severity of his symptoms and the potential need for hospitalization in order to get the right balance between benefit and side effects.  On an outpatient setting we can only make changes once a week at best.  With inpatient setting changes could be made more quickly and side effects identified more quickly as well.  We will attempt to continue outpatient treatment as long as possible.  Follow-up 2 weeks  This was a 30-minute appointment  Lynder Parents, MD, DFAPA   Please see After Visit Summary for patient specific instructions.  Future Appointments  Date Time Provider [place]  05/22/2021  8:00 AM Mahala Menghini, PA-C [facility]  06/04/2021 11:00 AM Cottle, Billey Co., MD [facility] None  06/21/2021 10:20 AM Croitoru, Dani Gobble, MD [facility]    No orders of the defined types were placed in this encounter.     -------------------------------

## 2021-05-14 DIAGNOSIS — F3162 Bipolar disorder, current episode mixed, moderate: Secondary | ICD-10-CM | POA: Diagnosis not present

## 2021-05-14 DIAGNOSIS — Z6829 Body mass index (BMI) 29.0-29.9, adult: Secondary | ICD-10-CM | POA: Diagnosis not present

## 2021-05-22 ENCOUNTER — Other Ambulatory Visit: Payer: Self-pay

## 2021-05-22 ENCOUNTER — Ambulatory Visit (INDEPENDENT_AMBULATORY_CARE_PROVIDER_SITE_OTHER): Payer: Medicare Other | Admitting: Gastroenterology

## 2021-05-22 ENCOUNTER — Encounter: Payer: Self-pay | Admitting: Gastroenterology

## 2021-05-22 VITALS — BP 136/72 | HR 85 | Temp 97.8°F | Ht 71.0 in | Wt 208.4 lb

## 2021-05-22 DIAGNOSIS — Z7901 Long term (current) use of anticoagulants: Secondary | ICD-10-CM | POA: Diagnosis not present

## 2021-05-22 DIAGNOSIS — K219 Gastro-esophageal reflux disease without esophagitis: Secondary | ICD-10-CM

## 2021-05-22 DIAGNOSIS — Z86010 Personal history of colonic polyps: Secondary | ICD-10-CM | POA: Diagnosis not present

## 2021-05-22 NOTE — Progress Notes (Signed)
Primary Care Physician: Sharilyn Sites, MD  Primary Gastroenterologist:  Garfield Cornea, MD   Chief Complaint  Patient presents with   Gastroesophageal Reflux    Doing fine as long as he takes his medications    HPI: Casey [name] is a 75 y.o. male here for follow-up.  Patient was last seen in August 2020.  History of GERD, constipation, colon polyps.  Due for colonoscopy at this time.  Last colonoscopy in 2017. Patient has history of remote post polypectomy bleed requiring transfusion and hospitalization in 2002.   Patient states he is doing well from a GI standpoint. His heartburn is well controlled. No dysphagia. No n/v, abdominal pain. BM fairly regular. Takes fiber, bisacodyl or Linzess when needed for constipation. Cannot tolerate Linzess daily because its too strong. Does not feel like needs anything other options right now. For the most part has regular BMs. No melena, brbpr.   His biggest problem has been rash on all extremities and his torso. States he has had rash for years and is followed by dermatology but recently rash on his arms worse. Per patient, PCP thought that might be medication related and stopped one of his meds but he does not recall which one. He is using a cream which is helping. He has also had swelling in the lower extremities and had to increase his Lasix from 20 to 40 mg by his PCP.  States his swelling has improved but remains significant.  He has a follow-up with his cardiologist next month.  History of permanent A. fib without major structural cardiac abnormalities.  Echocardiogram in 2015 with LVEF of 55 to 60%.  CT abdomen pelvis with contrast in June 2020 with no evidence of advanced liver disease.  Liver appeared normal.  Spleen normal in size.  Weight has been stable over the past 2 years.  Current Outpatient Medications  Medication Sig Dispense Refill   acetaminophen (TYLENOL) 500 MG tablet Take 1,000 mg by mouth every 6 (six) hours as needed  for headache.     Albuterol Sulfate 108 (90 Base) MCG/ACT AEPB Inhale 1-2 puffs into the lungs as needed.     bisacodyl (DULCOLAX) 5 MG EC tablet Take 5-15 mg by mouth daily as needed for moderate constipation.     cholecalciferol (VITAMIN D) 1000 UNITS tablet Take 2,000 Units by mouth daily.     clotrimazole (LOTRIMIN) 1 % cream Apply 1 application topically 2 (two) times daily.     fluticasone (FLONASE) 50 MCG/ACT nasal spray Place 2 sprays into both nostrils daily as needed.  11   furosemide (LASIX) 20 MG tablet Take 40 mg by mouth daily.     Ginkgo Biloba 40 MG TABS Take 40 mg by mouth 2 (two) times daily.     levothyroxine (SYNTHROID, LEVOTHROID) 25 MCG tablet Take 25 mcg by mouth daily before breakfast.     linaclotide (LINZESS) 72 MCG capsule Take 1 capsule (72 mcg total) by mouth daily before breakfast. (Patient taking differently: Take 72 mcg by mouth daily before breakfast. As needed) 30 capsule 2   metoprolol succinate (TOPROL-XL) 50 MG 24 hr tablet Take 25 mg by mouth daily. Take with or immediately following a meal. Takes 1/2 tablet.     OLANZapine (ZYPREXA) 20 MG tablet Take 1 tablet  by mouth 3 hours prior to bedtime. 30 tablet 0   oxcarbazepine (TRILEPTAL) 600 MG tablet Take 2 tablets (1,200 mg total) by mouth at bedtime. 180 tablet 1  pantoprazole (PROTONIX) 40 MG tablet TAKE ONE TABLET BY MOUTH ONCE DAILY 30 tablet 11   pramipexole (MIRAPEX) 0.25 MG tablet Take 1 tablet (0.25 mg total) by mouth 2 (two) times daily. 180 tablet 1   rivaroxaban (XARELTO) 20 MG TABS tablet Take 20 mg by mouth daily after supper.      SUPER B COMPLEX/C PO Take by mouth daily.     traMADol-acetaminophen (ULTRACET) 37.5-325 MG tablet Take 1-2 tablets by mouth every 4 (four) hours as needed. 1-2 tabs     No current facility-administered medications for this visit.    Allergies as of 05/22/2021 - Review Complete 05/22/2021  Allergen Reaction Noted   Amlodipine Other (See Comments) 11/02/2012    Vicodin [hydrocodone-acetaminophen] Other (See Comments) 01/15/2016   Past Medical History:  Diagnosis Date   Arthritis    Atrial fibrillation ([place]) 01/12/2010   2D Echo EF=>55%   Bipolar disorder ([facility])    Chronic back pain    Depression    Dysrhythmia    AFib   History of colonic polyps    History of gout    Hyperlipidemia    Hypertension    Hypothyroidism    Morbid obesity ([place])    OSA (obstructive sleep apnea)    AHI was 27.62hr RDI was 34.3 hr REM 0.00hr; had CPAP years ago but no longer uses.   Prostatitis    Tubular adenoma of colon 01/2016   Past Surgical History:  Procedure Laterality Date   APPENDECTOMY  1959   asthma     CATARACT EXTRACTION Bilateral 1995   COLONOSCOPY  2011   RMR: left-sided diverticula, minimal rectal friability. No polyps. Repeat 5 years.   COLONOSCOPY WITH PROPOFOL N/A 01/15/2016   Dr.Rourk- diverticulosis in the entire examined colon, multiple rectal and colonic polyps, internal hemorrhoids. bx= tubular adenomas and hyperplastic polyps.    HAND / FINGER LESION EXCISION Right    IR THORACENTESIS ASP PLEURAL SPACE W/IMG GUIDE  03/01/2019   IR THORACENTESIS ASP PLEURAL SPACE W/IMG GUIDE  03/31/2019   POLYPECTOMY  01/15/2016   Procedure: POLYPECTOMY;  Surgeon: Daneil Dolin, MD;  Location: [facility];  Service: Endoscopy;;   right shoulder surgery     TONSILLECTOMY  1955   Family History  Problem Relation Age of Onset   Diabetes Mother    Heart failure Mother    Hypertension Mother    Hypertension Father    Cancer Paternal Grandmother    Stroke Sister    Liver disease Brother    Vascular Disease Brother    Arthritis Brother    Colon cancer Neg Hx    Social History   Tobacco Use   Smoking status: Never   Smokeless tobacco: Never  Substance Use Topics   Alcohol use: No    Alcohol/week: 0.0 standard drinks   Drug use: No    ROS:  General: Negative for anorexia, weight loss, fever, chills, fatigue, weakness. ENT: Negative for  hoarseness, difficulty swallowing , nasal congestion. CV: Negative for chest pain, angina, palpitations, +dyspnea on exertion, +peripheral edema.  Respiratory: Negative for dyspnea at rest, dyspnea on exertion, cough, sputum, wheezing.  GI: See history of present illness. GU:  Negative for dysuria, hematuria, urinary incontinence, urinary frequency, nocturnal urination.  Endo: Negative for unusual weight change.    Physical Examination:   BP 136/72   Pulse 85   Temp 97.8 F (36.6 C)   Ht 5\' 11"  (1.803 m)   Wt 208 lb 6.4 oz (94.5 kg)  BMI 29.07 kg/m   General: Well-nourished, well-developed in no acute distress.  Eyes: No icterus. Mouth: masked Lungs: Clear to auscultation bilaterally.  Heart: Regular rate and rhythm, no murmurs rubs or gallops.  Abdomen: Bowel sounds are normal, nontender, nondistended, no hepatosplenomegaly or masses, no abdominal bruits or hernia , no rebound or guarding.   Extremities: 2+ lower extremity edema to knees bilaterally. No clubbing or deformities. Neuro: Alert and oriented x 4   Skin: Warm and dry, no jaundice.  Folliculitis noted on the torso. Macular lesions noted on upper and lower extremities.  Psych: Alert and cooperative, normal mood and affect.     Assessment:  Constipation: doing well. Rarely has to take Linzess. Cannot tolerate every day due to loose stools. Tends to use fiber daily when needed instead.   GERD: doing well on pantoprazole.  H/O colonic adenomas: due for surveillance colonoscopy at any time. He has had some issues with lower extremity edema and recently doubled his Lasix.  Continues to have 2+ pitting edema to his knees.  Etiology unclear.  No evidence of advanced liver disease on prior imaging in 2020.  He is due to see his cardiologist next month and would like further evaluation prior to scheduling colonoscopy.  He may or may not require any further cardiac work-up for his edema but will await cardiac clearance given his  other multiple comorbidities.  We will also need to hold his Xarelto for 48 hours prior to procedure.  He does have a history of remote post polypectomy bleeding while he was on Coumadin, therefore will take extra measures to try to prevent further bleeding although he remains at increased risk because of his ongoing Xarelto use.  Patient voiced understanding.   Plan: Colonoscopy with Dr. Gala Romney with propofol ASA III. Will wait to schedule until after he has been seen by his cardiologist next month and cleared from cardiac standpoint given significant lower extremity edema. Will need to hold Xarelto 48 hours before procedure.  I have discussed the risks, alternatives, benefits with regards to but not limited to the risk of reaction to medication, bleeding, infection, perforation and the patient is agreeable to proceed. Written consent to be obtained. Continue Linzess 80mcg daily as needed. Can take fiber supplement and stool softener (docusate sodium) 1-2 daily as needed. Advised against regular bisacodyl use.  Continue pantoprazole 40mg  daily.

## 2021-05-22 NOTE — Patient Instructions (Addendum)
Continue pantoprazole 40mg  daily before breakfast for acid reflux. Continue Linzess once daily as needed for constipation. We will plan to schedule you for colonoscopy after you have been evaluated by your cardiologist.

## 2021-05-23 DIAGNOSIS — N10 Acute tubulo-interstitial nephritis: Secondary | ICD-10-CM | POA: Diagnosis not present

## 2021-05-23 DIAGNOSIS — I4891 Unspecified atrial fibrillation: Secondary | ICD-10-CM | POA: Diagnosis not present

## 2021-05-23 DIAGNOSIS — F319 Bipolar disorder, unspecified: Secondary | ICD-10-CM | POA: Diagnosis not present

## 2021-05-23 DIAGNOSIS — I10 Essential (primary) hypertension: Secondary | ICD-10-CM | POA: Diagnosis not present

## 2021-05-24 ENCOUNTER — Other Ambulatory Visit: Payer: Self-pay

## 2021-05-24 ENCOUNTER — Ambulatory Visit (INDEPENDENT_AMBULATORY_CARE_PROVIDER_SITE_OTHER): Payer: Medicare Other | Admitting: Psychiatry

## 2021-05-24 ENCOUNTER — Encounter: Payer: Self-pay | Admitting: Psychiatry

## 2021-05-24 DIAGNOSIS — F3110 Bipolar disorder, current episode manic without psychotic features, unspecified: Secondary | ICD-10-CM | POA: Diagnosis not present

## 2021-05-24 DIAGNOSIS — F411 Generalized anxiety disorder: Secondary | ICD-10-CM

## 2021-05-24 DIAGNOSIS — G251 Drug-induced tremor: Secondary | ICD-10-CM

## 2021-05-24 DIAGNOSIS — F5105 Insomnia due to other mental disorder: Secondary | ICD-10-CM

## 2021-05-24 MED ORDER — OLANZAPINE 20 MG PO TABS: ORAL_TABLET | ORAL | 0 refills | Status: DC

## 2021-05-24 NOTE — Progress Notes (Signed)
Casey [name] [id number] 02/04/1946 74 y.o.    Subjective:   Patient ID:  Casey [name] is a 75 y.o. (DOB [birth year]) male.  Chief Complaint:  Chief Complaint  Patient presents with   Follow-up   Manic Behavior    HPI seen with wife Zyen Triggs presents for follow-up of bipolar disorder and anxiety and history of confusion  At visit  Dec 02, 2018.  He was having confusion and benztropine was stopped to see if that was the cause.  Also it was having balance issues and therefore Trileptal was switched all to nighttime 1200 mg nightly. Confusion much better per both of them with DC benztropine.  Balance better with change Trileptal.   visit was January 13, 2019 and he was doing well as noted above and he was encouraged not to make further med changes at that time.  visit April 13, 2019.  He was doing better with regard to depression than he had in quite some time on the Wellbutrin and no meds were changed.  He was having health problems with pulmonary effusion and fluid removed twice related to fall in January 2020.  seen September 13, 2019.  For residual depression the following decisions were made: Balance problem much improved with dosing Trileptal all in the evening. Depression under partial control & wife wants it to be better per his wife.  No mood swings.   Increase bupropion XL to 2 of the 150 mg tablets in the morning until the current bottle is gone. Then new RX will be 1 of the 300 mg tablets each morning.  November 08, 2019 appointment the following is noted:  Seen with wife, [name]. Small amount of improvement with change.  Anxiety got worse.  Notices when he goes out.  Worries about social and political things.  Wife keeps him active at home.  Allerigies bother him..  No sig caffeine except tea when goes out.  Wife sees him as a little more active and initiative.  More productive.  Started some cooking.  Getting out and up better.  A little more talkative.   "He wants  to be hyper but we know what happens". Wife [name] thinks   But for the last 2 years he's done nothing and laid around all the time.  He's a lot more active now. Big improvement. Went to church first time in 2 years. Plan:  For TRD, Pramipexole 0.25 mg tablet 1/2 twice daily for 1 week then 1 twice daily.  12/20/2019 appointment with the following noted:  Seen with wife. A little better motivated to get out but not to get in crowds.  Wife agrees more motivated and went to church once alone without pressure.  Wife says he's more talkative and much  Better interest.  Still not motivated to do things like mow the grass.  Others's have seen a difference. Concentration and memory are improved but not normal. No Se pramipexole.  Getting out of bed earlier and better now.. Wife admisters meds bc earlier he messsed them up. Plan: Wife has seen a big improvement therefore Partial improvement is clear from this so increase Pramipexole 0.375 mg tablet  twice daily  Anxiety is not fully managed.  Partly related to depression. Tremor is manageable.    02/15/20 appt with the following noted:  Send with wife [name] Doing good.  Increased pramipexole as indicated.  Tolerating meds Wellbutrin XL 300 mg AM & Trileptal 1200 mg HS with it. Much better with  depression.  Stays up more and speinding less time in bed.   Wife agrees he's much better and handling things better. 2 1/2 yo 4th cousin girl drowned in pond lately.  Big family. Wife pleased he handled it better. SE constipation ? Related. History Linzess but diarrhea. He's not highly motivated but it is better.  Depression is much improved.  Had been depressed for years. Plan no med changes  06/13/2020 appointment with the following noted: Pretty good but cardiologist noted today increased pulse needing treatment.  Uncontrolled afib even after ablation. Hard to tell about depression bc of fatigue Dt heart problems. Wife said he did well Thanksgiving and  participated more in conversation per wife. Plan: no med changes  09/06/2020 appointment with following noted: Remains on Wellbutrin XL 300 mg every morning, ginkgo biloba 40 mg twice daily, oxcarbazepine 1200 mg nightly, pramipexole 0.75 mg twice daily. Less sig health concerns. He thinks mood has been pretty good.  Consistent with meds. Wife thinks he stays tired and sleepy and wonders if can give him a stimulant.   Sleep good.  Will nap.  Doesn't bother him.  Walks dog 20 min in AM.  Not much initiative per wife.  Not depressed but when depressed he stayed in the bed lasting a couple of years.  History of OSA but lost weight and doesn't think he has apnea anymore.  No CPAP. Chronic afib. Not much caffeine. Plan: Wife& patient has seen a big improvement with Pramipexole 0.375 mg tablet  twice daily They ask consideration be given to the following: If his energy gets better by treating his tachycardia they would like to consider reducing the pramipexole or possibly eliminating it at follow-up.   For energy trial reduction pramipexole to 0.25 mg BID.  If gets more depressed call. Alternative of modafinil 100  12/04/2020 appointment with the following noted: Energy is better with awakening earlier at 71 and doing more and getting out more.  Going to bed later with 8 hour  Sleep. Mood fluctuates with more reacting to wife mainly.  More outspoken.  Per W he has to vent.  She thinks he is too focused on politics and issues.   Plan: Depression under control.  No mood swings.   Continue bupropion XL 300 mg tablets each morning. Continue Trileptal 600 mg 2 at HS. No higher DT anxiety risk and no traditional stimulants DT afib.  Wife & patient has seen a big improvement with Pramipexole and able to reduce to  0.25 mg tablet  twice daily  02/20/2021 appointment with the following noted:  seen with wife Prednisone made him real hyper.  Been off of it for at least a couple weeks.  Hyperverbal and  hyperactive.  Hip pain interferes with sleep.  Sees doctor tomorrow. Average 3-4 hours at night and no naps bc busy.  No excessive spending but more than usual.  Denies appetite disturbance.  Patient reports that energy and motivation have been reduced.  Patient has difficulty with concentration and memory.  Patient denies any suicidal ideation.  W had MI in June. A/P: mild mania Wean Wellbutrin over 2 weeks. Continue Trileptal 600 mg 2 at HS. Wife & patient has seen a big improvement with Pramipexole and able to reduce to  0.25 mg tablet  twice daily. Continue it unless mania does not resolve with less bupropion.  03/26/2021 phone call: [name] stated Darivs has slightly improved since last week.He has not been able to sleep and was up at 2  am texting people in a disoriented state.His pcp put him on Ambien 10 mg and this has helped him sleep.However,linda stated he is still working him self too much.She said he is jittery and has to have something to do at all times.He does not want to slow down or get rest.She thinks he needs to cutback on the Mirapex as well. MD response:  I agree with his wife about reducing Mirapex from 1 tablet twice a day to half tablet twice a day for 1 week and if his symptoms are not resolved and stop the Mirapex.  04/18/2021 phone call with wife and MD: Patient is still manic after stopping Wellbutrin and pramipexole.  Wife's concern is hard to get him to settle down at night.  Reviewed with wife prior psychiatric medications which are multiple.  Typically one would use an antipsychotic in this case but she does not think that they worked in the past.  She says he has been on higher doses of Trileptal and tolerated it. Increase Trileptal to 600 mg every morning and 1200 mg nightly for mania.  Keep scheduled appointment.  05/02/2021 wife reports patient still manic: We cannot go higher in the Trileptal because of balance issues.  Therefore olanzapine 10 mg nightly was started and  we discussed the option of hospitalization.  We will try to get the patient in urgently to an appointment.  05/07/2021 appointment with the following noted: seen with wife, [name] Not doing too well.  Trouble getting out of bed.  Wife says he's still up in middle of the night but he says he's sleeping better.  Sleep in recliner downstairs. She says he's still manic with hyperactive, hyper graphia but won't show her what he's writing. On olanzapine  No changes off Wellbutrin and pramipexole. Apparently more balance problems with increase Trileptal to 1800 mg daily.  Dropped back to 1200 mg HS. Admits to some confusion.  Has pulled off labels on bottle per wife. She thinks he's averaging 4-6 hours of sleep and a little better than it was. Wife said he started cussing and he has not done that in the past. Other episodes of inappropriate decision making.  He recognizes he's manic but can't control himself.  Hyperverbal, pressured.  In past had speech problems with mania also.  Neg neuro work up for stroke in the past. SE balance and tremor. Some trouble getting out of chair? Plan: For mania,Increase olanzapine to 15 mg PM Follow-up Friday for urgent visit  05/11/2021 appointment with the following noted: Calming down a little. He thinks he's sleeping better.  She says he needs whole Ambien with olanzapine and last night still not sleeping.  He woke her with complaints of back pain. She's not seeing SE and nor is he.  She's not seeing much change  He says back pain is complicating his sleep and his problem. He tries to stay active.   She says he talks incessantly but not as much to her. She says major life events have triggered mania in him in the past. She notices him making poor decisions. Plan: Increase olanzapine to 20 mg PM  05/24/2021 appointment with the following noted: with D Nevin Bloodgood A week ago went of to doctor's office alone and wife called bc he was agitated. Per wife still going wide  open but is sleeping better. Yesterday had fever and talking out of his head and dx UTI. He still feels driven to do chores but distractible and inefficient.   D does  not see improvement and nor does wife.   7 hour sleep lately.  Past Psychiatric Medication Trials: Seroquel 300, olanzapine, perphenazine, risperidone,  Lithium helped but SE kidneys and prostate,  Latuda,  Abilify,  alprazolam,  Depakote, Trileptal, carbamazepine, lamotrigine caused nausea, citalopram, sertraline 150,   Wellbutrin 300 anxiety,  Pramipexole 0.375 BID Benztropine confusion        1 prior psych hosp depression                                                    Review of Systems:  Review of Systems  Constitutional:  Positive for fatigue.  Eyes:  Positive for visual disturbance.  Gastrointestinal:  Positive for constipation.  Musculoskeletal:  Positive for arthralgias, back pain and gait problem.  Skin:  Negative for rash.       chronic  Neurological:  Positive for tremors and weakness.        Balance px longterm no worse  Psychiatric/Behavioral:  Positive for agitation. Negative for behavioral problems, confusion, decreased concentration, dysphoric mood, hallucinations, self-injury, sleep disturbance and suicidal ideas. The patient is nervous/anxious and is hyperactive.    Medications: I have reviewed the patient's current medications.  Current Outpatient Medications  Medication Sig Dispense Refill   acetaminophen (TYLENOL) 500 MG tablet Take 1,000 mg by mouth every 6 (six) hours as needed for headache.     Albuterol Sulfate 108 (90 Base) MCG/ACT AEPB Inhale 1-2 puffs into the lungs as needed.     bisacodyl (DULCOLAX) 5 MG EC tablet Take 5-15 mg by mouth daily as needed for moderate constipation.     cholecalciferol (VITAMIN D) 1000 UNITS tablet Take 2,000 Units by mouth daily.     clotrimazole (LOTRIMIN) 1 % cream Apply 1 application topically 2 (two) times daily.     fluticasone (FLONASE) 50 MCG/ACT  nasal spray Place 2 sprays into both nostrils daily as needed.  11   furosemide (LASIX) 20 MG tablet Take 40 mg by mouth daily.     Ginkgo Biloba 40 MG TABS Take 40 mg by mouth 2 (two) times daily.     levothyroxine (SYNTHROID, LEVOTHROID) 25 MCG tablet Take 25 mcg by mouth daily before breakfast.     linaclotide (LINZESS) 72 MCG capsule Take 1 capsule (72 mcg total) by mouth daily before breakfast. (Patient taking differently: Take 72 mcg by mouth daily before breakfast. As needed) 30 capsule 2   metoprolol succinate (TOPROL-XL) 50 MG 24 hr tablet Take 25 mg by mouth daily. Take with or immediately following a meal. Takes 1/2 tablet.     OLANZapine (ZYPREXA) 20 MG tablet Take 1 tablet  by mouth 3 hours prior to bedtime. 30 tablet 0   oxcarbazepine (TRILEPTAL) 600 MG tablet Take 2 tablets (1,200 mg total) by mouth at bedtime. 180 tablet 1   pantoprazole (PROTONIX) 40 MG tablet TAKE ONE TABLET BY MOUTH ONCE DAILY 30 tablet 11   rivaroxaban (XARELTO) 20 MG TABS tablet Take 20 mg by mouth daily after supper.      SUPER B COMPLEX/C PO Take by mouth daily.     traMADol-acetaminophen (ULTRACET) 37.5-325 MG tablet Take 1-2 tablets by mouth every 4 (four) hours as needed. 1-2 tabs     No current facility-administered medications for this visit.    Medication Side Effects: resolved  Allergies:  Allergies  Allergen Reactions   Amlodipine Other (See Comments)    Stopped due to kidney issues   Vicodin [Hydrocodone-Acetaminophen] Other (See Comments)    Patient is unsure of reaction    Past Medical History:  Diagnosis Date   Arthritis    Atrial fibrillation ([place]) 01/12/2010   2D Echo EF=>55%   Bipolar disorder ([facility])    Chronic back pain    Depression    Dysrhythmia    AFib   History of colonic polyps    History of gout    Hyperlipidemia    Hypertension    Hypothyroidism    Morbid obesity ([place])    OSA (obstructive sleep apnea)    AHI was 27.62hr RDI was 34.3 hr REM 0.00hr; had CPAP  years ago but no longer uses.   Prostatitis    Tubular adenoma of colon 01/2016    Family History  Problem Relation Age of Onset   Diabetes Mother    Heart failure Mother    Hypertension Mother    Hypertension Father    Cancer Paternal Grandmother    Stroke Sister    Liver disease Brother    Vascular Disease Brother    Arthritis Brother    Colon cancer Neg Hx     Social History   Socioeconomic History   Marital status: Married    Spouse name: Not on file   Number of children: Not on file   Years of education: Not on file   Highest education level: Not on file  Occupational History   Not on file  Tobacco Use   Smoking status: Never   Smokeless tobacco: Never  Substance and Sexual Activity   Alcohol use: No    Alcohol/week: 0.0 standard drinks   Drug use: No   Sexual activity: Yes    Birth control/protection: None  Other Topics Concern   Not on file  Social History Narrative   Not on file   Social Determinants of Health   Financial Resource Strain: Not on file  Food Insecurity: Not on file  Transportation Needs: Not on file  Physical Activity: Not on file  Stress: Not on file  Social Connections: Not on file  Intimate Partner Violence: Not on file    Past Medical History, Surgical history, Social history, and Family history were reviewed and updated as appropriate.   Please see review of systems for further details on the patient's review from today.   Objective:   Physical Exam:  There were no vitals taken for this visit.  Physical Exam Constitutional:      General: He is not in acute distress. Musculoskeletal:        General: No deformity.  Neurological:     Mental Status: He is alert and oriented to person, place, and time.     Coordination: Coordination abnormal.     Gait: Gait abnormal.  Psychiatric:        Attention and Perception: He is attentive. He does not perceive auditory hallucinations.        Mood and Affect: Mood is not anxious or  depressed. Affect is not labile, blunt, angry or inappropriate.        Speech: Speech is rapid and pressured and tangential.        Behavior: Behavior normal.        Thought Content: Thought content normal. Thought content does not include homicidal or suicidal ideation. Thought content does not include homicidal or suicidal plan.  Cognition and Memory: Cognition normal.        Judgment: Judgment normal.     Comments: Insight intact. No auditory or visual hallucinations. No delusions.  Mania unchanged per family Pressured speech. Emotional lability not present in office    Lab Review:     Component Value Date/Time   NA 141 12/28/2018 0941   K 4.0 12/28/2018 0941   CL 104 12/28/2018 0941   CO2 22 12/28/2018 0941   GLUCOSE 88 12/28/2018 0941   GLUCOSE 111 (H) 06/30/2018 1311   BUN 16 12/28/2018 0941   CREATININE 1.30 (H) 12/28/2018 0941   CALCIUM 9.4 12/28/2018 0941   PROT 6.6 12/28/2018 0941   ALBUMIN 4.5 12/28/2018 0941   AST 21 12/28/2018 0941   ALT 22 12/28/2018 0941   ALKPHOS 115 12/28/2018 0941   BILITOT 0.7 12/28/2018 0941   GFRNONAA 55 (L) 12/28/2018 0941   GFRAA 63 12/28/2018 0941       Component Value Date/Time   WBC 8.1 12/28/2018 0941   WBC 5.4 06/30/2018 1311   RBC 4.81 12/28/2018 0941   RBC 4.52 06/30/2018 1311   HGB 15.2 12/28/2018 0941   HCT 42.9 12/28/2018 0941   PLT 186 12/28/2018 0941   MCV 89 12/28/2018 0941   MCH 31.6 12/28/2018 0941   MCH 31.9 06/30/2018 1311   MCHC 35.4 12/28/2018 0941   MCHC 32.8 06/30/2018 1311   RDW 12.9 12/28/2018 0941   LYMPHSABS 1.1 12/28/2018 0941   MONOABS 0.5 06/30/2018 1311   EOSABS 0.1 12/28/2018 0941   BASOSABS 0.0 12/28/2018 0941    No results found for: POCLITH, LITHIUM   No results found for: PHENYTOIN, PHENOBARB, VALPROATE, CBMZ   .res Assessment: Plan:    Marvyn was seen today for follow-up and manic behavior.  Diagnoses and all orders for this visit:  Bipolar I disorder, most recent episode  (or current) manic ([place])  Generalized anxiety disorder  Tremor due to multiple drugs  Insomnia due to mental condition  History pseudodementia with depression which is resolved.  Dx bipolar at 75 yo.  Bipolar depression has flipped into mania from prednisone.  Patient is very complicated because of weakness requiring a walker and medical problems as well as ongoing mania.  Disc this at length and risk of crashing into depression if we can't ease him down.   Pt is still manic.    Prednisone triggered some mania in last few weeks and persisted off prednisone.   Ongoing mania no better and maybe worse.  May require hospitalization bc of the problems noted and difficulty getting anti-manic meds adjusted quickly.  Need better control mania but all anti-manic meds have fall risk.  There is no treatment option without significant risk at this point.  If we pick a medicine with low sedation risk then he will have to have a sleeping pill which exposes him to fall risk.  We discussed the pros and cons of options.  Sleeping better with olanzapine but some tremor..   Disc difficulty finding anti manic med with benefit for sleep but low EPS and fall risk. Consider Saphris, loxapine, retry quetiapine, increase olanzapine.  Tolerated the last increase olanzapine.  No better with Increase olanzapine to 20 mg PM DDI with oxcarb may be interfering with full benefit of olanzapine. Tolerating olanzapine. Increase olanzapine to 30 mg PM  Increase olanzapine to 30 mg in evening Reduce oxcarbazepine to 1 and 1/2 tablets in the evening.  Discussed side effect of each medication Fall precautions.  Discussed the severity of his symptoms and the potential need for hospitalization in order to get the right balance between benefit and side effects.  On an outpatient setting we can only make changes once a week at best.  With inpatient setting changes could be made more quickly and side effects identified more  quickly as well.  We will attempt to continue outpatient treatment as long as possible.  Follow-up 10 days  This was a 30-minute appointment  Lynder Parents, MD, DFAPA   Please see After Visit Summary for patient specific instructions.  Future Appointments  Date Time Provider [place]  06/04/2021 11:00 AM Cottle, Billey Co., MD [facility] None  06/21/2021 10:20 AM Croitoru, Dani Gobble, MD [facility]    No orders of the defined types were placed in this encounter.     -------------------------------

## 2021-05-24 NOTE — Patient Instructions (Signed)
Increase olanzapine to 30 mg in evening Reduce oxcarbazepine to 1 and 1/2 tablets in the evening.

## 2021-05-28 ENCOUNTER — Telehealth: Payer: Self-pay

## 2021-05-28 NOTE — Telephone Encounter (Signed)
Prior Authorization submitted and approved for OLANZAPINE 20 MG 1.5 TABS #45 effective 05/28/2021-05/28/2022 with [employer] Medicare Part D.

## 2021-05-30 DIAGNOSIS — D225 Melanocytic nevi of trunk: Secondary | ICD-10-CM | POA: Diagnosis not present

## 2021-05-30 DIAGNOSIS — Z1283 Encounter for screening for malignant neoplasm of skin: Secondary | ICD-10-CM | POA: Diagnosis not present

## 2021-05-30 DIAGNOSIS — I872 Venous insufficiency (chronic) (peripheral): Secondary | ICD-10-CM | POA: Diagnosis not present

## 2021-06-04 ENCOUNTER — Ambulatory Visit: Payer: Medicare Other | Admitting: Psychiatry

## 2021-06-04 ENCOUNTER — Encounter: Payer: Self-pay | Admitting: Psychiatry

## 2021-06-04 ENCOUNTER — Other Ambulatory Visit: Payer: Self-pay

## 2021-06-04 DIAGNOSIS — G251 Drug-induced tremor: Secondary | ICD-10-CM | POA: Diagnosis not present

## 2021-06-04 DIAGNOSIS — F5105 Insomnia due to other mental disorder: Secondary | ICD-10-CM

## 2021-06-04 DIAGNOSIS — F3110 Bipolar disorder, current episode manic without psychotic features, unspecified: Secondary | ICD-10-CM

## 2021-06-04 DIAGNOSIS — F411 Generalized anxiety disorder: Secondary | ICD-10-CM | POA: Diagnosis not present

## 2021-06-04 DIAGNOSIS — R410 Disorientation, unspecified: Secondary | ICD-10-CM

## 2021-06-04 MED ORDER — OLANZAPINE 20 MG PO TABS: ORAL_TABLET | ORAL | 1 refills | Status: DC

## 2021-06-04 MED ORDER — OLANZAPINE 10 MG PO TABS: 10.0000 mg | ORAL_TABLET | Freq: Every day | ORAL | 0 refills | Status: DC

## 2021-06-04 NOTE — Patient Instructions (Addendum)
Reduce oxcarbazepine (Trileptal) to 1 tablet for 1 week, Then reduce oxcarbazepine to 1/2 tablet at night for 1 week,  Then stop it.

## 2021-06-04 NOTE — Progress Notes (Signed)
KENTARO ALEWINE [id number] [birth year] 75 y.o.    Subjective:   Patient ID:  KALA GASSMANN is a 75 y.o. (DOB 11/30/1945) male.  Chief Complaint:  Chief Complaint  Patient presents with   Follow-up   Depression    Bipolar I disorder, most recent episode (or current) manic ([place])   Manic Behavior    HPI seen with wife Leverne Amrhein presents for follow-up of bipolar disorder and anxiety and history of confusion  At visit  Dec 02, 2018.  He was having confusion and benztropine was stopped to see if that was the cause.  Also it was having balance issues and therefore Trileptal was switched all to nighttime 1200 mg nightly. Confusion much better per both of them with DC benztropine.  Balance better with change Trileptal.   visit was January 13, 2019 and he was doing well as noted above and he was encouraged not to make further med changes at that time.  visit April 13, 2019.  He was doing better with regard to depression than he had in quite some time on the Wellbutrin and no meds were changed.  He was having health problems with pulmonary effusion and fluid removed twice related to fall in January 2020.  seen September 13, 2019.  For residual depression the following decisions were made: Balance problem much improved with dosing Trileptal all in the evening. Depression under partial control & wife wants it to be better per his wife.  No mood swings.   Increase bupropion XL to 2 of the 150 mg tablets in the morning until the current bottle is gone. Then new RX will be 1 of the 300 mg tablets each morning.  November 08, 2019 appointment the following is noted:  Seen with wife, [name]. Small amount of improvement with change.  Anxiety got worse.  Notices when he goes out.  Worries about social and political things.  Wife keeps him active at home.  Allerigies bother him..  No sig caffeine except tea when goes out.  Wife sees him as a little more active and initiative.  More productive.   Started some cooking.  Getting out and up better.  A little more talkative.   "He wants to be hyper but we know what happens". Wife [name] thinks   But for the last 2 years he's done nothing and laid around all the time.  He's a lot more active now. Big improvement. Went to church first time in 2 years. Plan:  For TRD, Pramipexole 0.25 mg tablet 1/2 twice daily for 1 week then 1 twice daily.  12/20/2019 appointment with the following noted:  Seen with wife. A little better motivated to get out but not to get in crowds.  Wife agrees more motivated and went to church once alone without pressure.  Wife says he's more talkative and much  Better interest.  Still not motivated to do things like mow the grass.  Others's have seen a difference. Concentration and memory are improved but not normal. No Se pramipexole.  Getting out of bed earlier and better now.. Wife admisters meds bc earlier he messsed them up. Plan: Wife has seen a big improvement therefore Partial improvement is clear from this so increase Pramipexole 0.375 mg tablet  twice daily  Anxiety is not fully managed.  Partly related to depression. Tremor is manageable.    02/15/20 appt with the following noted:  Send with wife [name] Doing good.  Increased pramipexole as indicated.  Tolerating  meds Wellbutrin XL 300 mg AM & Trileptal 1200 mg HS with it. Much better with depression.  Stays up more and speinding less time in bed.   Wife agrees he's much better and handling things better. 2 1/2 yo 4th cousin girl drowned in pond lately.  Big family. Wife pleased he handled it better. SE constipation ? Related. History Linzess but diarrhea. He's not highly motivated but it is better.  Depression is much improved.  Had been depressed for years. Plan no med changes  06/13/2020 appointment with the following noted: Pretty good but cardiologist noted today increased pulse needing treatment.  Uncontrolled afib even after ablation. Hard to tell about  depression bc of fatigue Dt heart problems. Wife said he did well Thanksgiving and participated more in conversation per wife. Plan: no med changes  09/06/2020 appointment with following noted: Remains on Wellbutrin XL 300 mg every morning, ginkgo biloba 40 mg twice daily, oxcarbazepine 1200 mg nightly, pramipexole 0.75 mg twice daily. Less sig health concerns. He thinks mood has been pretty good.  Consistent with meds. Wife thinks he stays tired and sleepy and wonders if can give him a stimulant.   Sleep good.  Will nap.  Doesn't bother him.  Walks dog 20 min in AM.  Not much initiative per wife.  Not depressed but when depressed he stayed in the bed lasting a couple of years.  History of OSA but lost weight and doesn't think he has apnea anymore.  No CPAP. Chronic afib. Not much caffeine. Plan: Wife& patient has seen a big improvement with Pramipexole 0.375 mg tablet  twice daily They ask consideration be given to the following: If his energy gets better by treating his tachycardia they would like to consider reducing the pramipexole or possibly eliminating it at follow-up.   For energy trial reduction pramipexole to 0.25 mg BID.  If gets more depressed call. Alternative of modafinil 100  12/04/2020 appointment with the following noted: Energy is better with awakening earlier at 38 and doing more and getting out more.  Going to bed later with 8 hour  Sleep. Mood fluctuates with more reacting to wife mainly.  More outspoken.  Per W he has to vent.  She thinks he is too focused on politics and issues.   Plan: Depression under control.  No mood swings.   Continue bupropion XL 300 mg tablets each morning. Continue Trileptal 600 mg 2 at HS. No higher DT anxiety risk and no traditional stimulants DT afib.  Wife & patient has seen a big improvement with Pramipexole and able to reduce to  0.25 mg tablet  twice daily  02/20/2021 appointment with the following noted:  seen with wife Prednisone made  him real hyper.  Been off of it for at least a couple weeks.  Hyperverbal and hyperactive.  Hip pain interferes with sleep.  Sees doctor tomorrow. Average 3-4 hours at night and no naps bc busy.  No excessive spending but more than usual.  Denies appetite disturbance.  Patient reports that energy and motivation have been reduced.  Patient has difficulty with concentration and memory.  Patient denies any suicidal ideation.  W had MI in June. A/P: mild mania Wean Wellbutrin over 2 weeks. Continue Trileptal 600 mg 2 at HS. Wife & patient has seen a big improvement with Pramipexole and able to reduce to  0.25 mg tablet  twice daily. Continue it unless mania does not resolve with less bupropion.  03/26/2021 phone call: [name] stated Rafel has  slightly improved since last week.He has not been able to sleep and was up at 2 am texting people in a disoriented state.His pcp put him on Ambien 10 mg and this has helped him sleep.However,linda stated he is still working him self too much.She said he is jittery and has to have something to do at all times.He does not want to slow down or get rest.She thinks he needs to cutback on the Mirapex as well. MD response:  I agree with his wife about reducing Mirapex from 1 tablet twice a day to half tablet twice a day for 1 week and if his symptoms are not resolved and stop the Mirapex.  04/18/2021 phone call with wife and MD: Patient is still manic after stopping Wellbutrin and pramipexole.  Wife's concern is hard to get him to settle down at night.  Reviewed with wife prior psychiatric medications which are multiple.  Typically one would use an antipsychotic in this case but she does not think that they worked in the past.  She says he has been on higher doses of Trileptal and tolerated it. Increase Trileptal to 600 mg every morning and 1200 mg nightly for mania.  Keep scheduled appointment.  05/02/2021 wife reports patient still manic: We cannot go higher in the Trileptal  because of balance issues.  Therefore olanzapine 10 mg nightly was started and we discussed the option of hospitalization.  We will try to get the patient in urgently to an appointment.  05/07/2021 appointment with the following noted: seen with wife, [name] Not doing too well.  Trouble getting out of bed.  Wife says he's still up in middle of the night but he says he's sleeping better.  Sleep in recliner downstairs. She says he's still manic with hyperactive, hyper graphia but won't show her what he's writing. On olanzapine  No changes off Wellbutrin and pramipexole. Apparently more balance problems with increase Trileptal to 1800 mg daily.  Dropped back to 1200 mg HS. Admits to some confusion.  Has pulled off labels on bottle per wife. She thinks he's averaging 4-6 hours of sleep and a little better than it was. Wife said he started cussing and he has not done that in the past. Other episodes of inappropriate decision making.  He recognizes he's manic but can't control himself.  Hyperverbal, pressured.  In past had speech problems with mania also.  Neg neuro work up for stroke in the past. SE balance and tremor. Some trouble getting out of chair? Plan: For mania,Increase olanzapine to 15 mg PM Follow-up Friday for urgent visit  05/11/2021 appointment with the following noted: Calming down a little. He thinks he's sleeping better.  She says he needs whole Ambien with olanzapine and last night still not sleeping.  He woke her with complaints of back pain. She's not seeing SE and nor is he.  She's not seeing much change  He says back pain is complicating his sleep and his problem. He tries to stay active.   She says he talks incessantly but not as much to her. She says major life events have triggered mania in him in the past. She notices him making poor decisions. Plan: Increase olanzapine to 20 mg PM  05/24/2021 appointment with the following noted: with D Nevin Bloodgood A week ago went of to  doctor's office alone and wife called bc he was agitated. Per wife still going wide open but is sleeping better. Yesterday had fever and talking out of his head and dx  UTI. He still feels driven to do chores but distractible and inefficient.   D does not see improvement and nor does wife.   7 hour sleep lately. Plan: Increase olanzapine to 30 mg in evening Reduce oxcarbazepine to 1 and 1/2 tablets in the evening.  06/04/2021 appointment: Seen with wife Sleeping more 7-9 hours.   Per wife still some confusion and still short tempered.  Less pressured sleep. He won't go to bed when sleepy and waits too long to get in bed and is weak and cannot do it by himself. Wife agrees he's less "wide open" than before and will be able to sit and watch TV show now. B died yesterday.   Past Psychiatric Medication Trials: Seroquel 300, olanzapine, perphenazine, risperidone,  Lithium helped but SE kidneys and prostate,  Latuda,  Abilify,  alprazolam,  Depakote, Trileptal, carbamazepine, lamotrigine caused nausea, citalopram, sertraline 150,   Wellbutrin 300 anxiety,  Pramipexole 0.375 BID Benztropine confusion        1 prior psych hosp depression                                                    Review of Systems:  Review of Systems  Constitutional:  Positive for fatigue.  Eyes:  Positive for visual disturbance.  Gastrointestinal:  Positive for constipation.  Musculoskeletal:  Positive for arthralgias, back pain and gait problem.  Skin:  Negative for rash.       chronic  Neurological:  Positive for tremors and weakness.        Balance px longterm no worse  Psychiatric/Behavioral:  Positive for agitation and decreased concentration. Negative for behavioral problems, confusion, dysphoric mood, hallucinations, self-injury, sleep disturbance and suicidal ideas. The patient is nervous/anxious and is hyperactive.    Medications: I have reviewed the patient's current medications.  Current Outpatient  Medications  Medication Sig Dispense Refill   acetaminophen (TYLENOL) 500 MG tablet Take 1,000 mg by mouth every 6 (six) hours as needed for headache.     Albuterol Sulfate 108 (90 Base) MCG/ACT AEPB Inhale 1-2 puffs into the lungs as needed.     bisacodyl (DULCOLAX) 5 MG EC tablet Take 5-15 mg by mouth daily as needed for moderate constipation.     cholecalciferol (VITAMIN D) 1000 UNITS tablet Take 2,000 Units by mouth daily.     clotrimazole (LOTRIMIN) 1 % cream Apply 1 application topically 2 (two) times daily.     fluticasone (FLONASE) 50 MCG/ACT nasal spray Place 2 sprays into both nostrils daily as needed.  11   furosemide (LASIX) 20 MG tablet Take 40 mg by mouth daily.     Ginkgo Biloba 40 MG TABS Take 40 mg by mouth 2 (two) times daily.     levothyroxine (SYNTHROID, LEVOTHROID) 25 MCG tablet Take 25 mcg by mouth daily before breakfast.     linaclotide (LINZESS) 72 MCG capsule Take 1 capsule (72 mcg total) by mouth daily before breakfast. (Patient taking differently: Take 72 mcg by mouth daily before breakfast. As needed) 30 capsule 2   metoprolol succinate (TOPROL-XL) 50 MG 24 hr tablet Take 25 mg by mouth daily. Take with or immediately following a meal. Takes 1/2 tablet.     OLANZapine (ZYPREXA) 10 MG tablet Take 1 tablet (10 mg total) by mouth at bedtime. 30 tablet 0   oxcarbazepine (TRILEPTAL) 600  MG tablet Take 2 tablets (1,200 mg total) by mouth at bedtime. (Patient taking differently: Take 900 mg by mouth at bedtime.) 180 tablet 1   pantoprazole (PROTONIX) 40 MG tablet TAKE ONE TABLET BY MOUTH ONCE DAILY 30 tablet 11   rivaroxaban (XARELTO) 20 MG TABS tablet Take 20 mg by mouth daily after supper.      SUPER B COMPLEX/C PO Take by mouth daily.     traMADol-acetaminophen (ULTRACET) 37.5-325 MG tablet Take 1-2 tablets by mouth every 4 (four) hours as needed. 1-2 tabs     OLANZapine (ZYPREXA) 20 MG tablet Take 1 and 1/2 tablet  by mouth 3 hours prior to bedtime. 30 tablet 1   No  current facility-administered medications for this visit.    Medication Side Effects: resolved  Allergies:  Allergies  Allergen Reactions   Amlodipine Other (See Comments)    Stopped due to kidney issues   Vicodin [Hydrocodone-Acetaminophen] Other (See Comments)    Patient is unsure of reaction    Past Medical History:  Diagnosis Date   Arthritis    Atrial fibrillation ([place]) 01/12/2010   2D Echo EF=>55%   Bipolar disorder ([facility])    Chronic back pain    Depression    Dysrhythmia    AFib   History of colonic polyps    History of gout    Hyperlipidemia    Hypertension    Hypothyroidism    Morbid obesity ([place])    OSA (obstructive sleep apnea)    AHI was 27.62hr RDI was 34.3 hr REM 0.00hr; had CPAP years ago but no longer uses.   Prostatitis    Tubular adenoma of colon 01/2016    Family History  Problem Relation Age of Onset   Diabetes Mother    Heart failure Mother    Hypertension Mother    Hypertension Father    Cancer Paternal Grandmother    Stroke Sister    Liver disease Brother    Vascular Disease Brother    Arthritis Brother    Colon cancer Neg Hx     Social History   Socioeconomic History   Marital status: Married    Spouse name: Not on file   Number of children: Not on file   Years of education: Not on file   Highest education level: Not on file  Occupational History   Not on file  Tobacco Use   Smoking status: Never   Smokeless tobacco: Never  Substance and Sexual Activity   Alcohol use: No    Alcohol/week: 0.0 standard drinks   Drug use: No   Sexual activity: Yes    Birth control/protection: None  Other Topics Concern   Not on file  Social History Narrative   Not on file   Social Determinants of Health   Financial Resource Strain: Not on file  Food Insecurity: Not on file  Transportation Needs: Not on file  Physical Activity: Not on file  Stress: Not on file  Social Connections: Not on file  Intimate Partner Violence: Not on file     Past Medical History, Surgical history, Social history, and Family history were reviewed and updated as appropriate.   Please see review of systems for further details on the patient's review from today.   Objective:   Physical Exam:  There were no vitals taken for this visit.  Physical Exam Constitutional:      General: He is not in acute distress. Musculoskeletal:        General: No deformity.  Neurological:     Mental Status: He is alert and oriented to person, place, and time.     Coordination: Coordination abnormal.     Gait: Gait abnormal.  Psychiatric:        Attention and Perception: He is attentive. He does not perceive auditory hallucinations.        Mood and Affect: Mood is not anxious or depressed. Affect is not labile, blunt, angry or inappropriate.        Speech: Speech is tangential. Speech is not rapid and pressured.        Behavior: Behavior normal.        Thought Content: Thought content normal. Thought content does not include homicidal or suicidal ideation. Thought content does not include homicidal or suicidal plan.        Cognition and Memory: Cognition normal.        Judgment: Judgment normal.     Comments: Insight intact. No auditory or visual hallucinations. No delusions.  Mania improving per family Less Pressured speech. Emotional lability not present in office    Lab Review:     Component Value Date/Time   NA 141 12/28/2018 0941   K 4.0 12/28/2018 0941   CL 104 12/28/2018 0941   CO2 22 12/28/2018 0941   GLUCOSE 88 12/28/2018 0941   GLUCOSE 111 (H) 06/30/2018 1311   BUN 16 12/28/2018 0941   CREATININE 1.30 (H) 12/28/2018 0941   CALCIUM 9.4 12/28/2018 0941   PROT 6.6 12/28/2018 0941   ALBUMIN 4.5 12/28/2018 0941   AST 21 12/28/2018 0941   ALT 22 12/28/2018 0941   ALKPHOS 115 12/28/2018 0941   BILITOT 0.7 12/28/2018 0941   GFRNONAA 55 (L) 12/28/2018 0941   GFRAA 63 12/28/2018 0941       Component Value Date/Time   WBC 8.1  12/28/2018 0941   WBC 5.4 06/30/2018 1311   RBC 4.81 12/28/2018 0941   RBC 4.52 06/30/2018 1311   HGB 15.2 12/28/2018 0941   HCT 42.9 12/28/2018 0941   PLT 186 12/28/2018 0941   MCV 89 12/28/2018 0941   MCH 31.6 12/28/2018 0941   MCH 31.9 06/30/2018 1311   MCHC 35.4 12/28/2018 0941   MCHC 32.8 06/30/2018 1311   RDW 12.9 12/28/2018 0941   LYMPHSABS 1.1 12/28/2018 0941   MONOABS 0.5 06/30/2018 1311   EOSABS 0.1 12/28/2018 0941   BASOSABS 0.0 12/28/2018 0941    No results found for: POCLITH, LITHIUM   No results found for: PHENYTOIN, PHENOBARB, VALPROATE, CBMZ   .res Assessment: Plan:    Braxtin was seen today for follow-up, depression and manic behavior.  Diagnoses and all orders for this visit:  Bipolar I disorder, most recent episode (or current) manic ([facility]) -     OLANZapine (ZYPREXA) 20 MG tablet; Take 1 and 1/2 tablet  by mouth 3 hours prior to bedtime. -     OLANZapine (ZYPREXA) 10 MG tablet; Take 1 tablet (10 mg total) by mouth at bedtime.  Generalized anxiety disorder  Tremor due to multiple drugs  Insomnia due to mental condition  Subacute confusional state  History pseudodementia with depression which is resolved.  Dx bipolar at 75 yo.  Bipolar depression has flipped into mania from prednisone.  Patient is very complicated because of weakness requiring a walker and medical problems as well as ongoing mania.  Disc this at length and risk of crashing into depression if we can't ease him down.   Pt is still manic.    Prednisone  triggered some mania in last few weeks and persisted off prednisone.   Ongoing mania no better and maybe worse.  May require hospitalization bc of the problems noted and difficulty getting anti-manic meds adjusted quickly.  Need better control mania but all anti-manic meds have fall risk.  There is no treatment option without significant risk at this point.  If we pick a medicine with low sedation risk then he will have to have a sleeping  pill which exposes him to fall risk.  We discussed the pros and cons of options.  Sleeping better with olanzapine but some tremor..   Disc difficulty finding anti manic med with benefit for sleep but low EPS and fall risk. Consider Saphris, loxapine, retry quetiapine, increase olanzapine.  Tolerated the last increase olanzapine.  Improving  with Increase olanzapine to 30 mg PM DDI with oxcarb may be interfering with full benefit of olanzapine. Tolerating olanzapine. Increase olanzapine to 30 mg PM  Continue olanzapine to 30 mg in evening Reduce oxcarbazepine  again to 1 tablets in the evening for 1 week, then 1/2 tablet at night for a week then stop it.  Discussed side effect of each medication Fall precautions.  Discussed the severity of his symptoms and the potential need for hospitalization in order to get the right balance between benefit and side effects.  On an outpatient setting we can only make changes once a week at best.  With inpatient setting changes could be made more quickly and side effects identified more quickly as well.  We will attempt to continue outpatient treatment as long as possible.  Follow-up 4 weeks  This was a 30-minute appointment  Lynder Parents, MD, DFAPA   Please see After Visit Summary for patient specific instructions.  Future Appointments  Date Time Provider [place]  06/21/2021 10:20 AM Croitoru, Dani Gobble, MD [facility]  07/04/2021  2:30 PM Cottle, Billey Co., MD [facility] None    No orders of the defined types were placed in this encounter.     -------------------------------

## 2021-06-13 DIAGNOSIS — E039 Hypothyroidism, unspecified: Secondary | ICD-10-CM | POA: Diagnosis not present

## 2021-06-13 DIAGNOSIS — N183 Chronic kidney disease, stage 3 unspecified: Secondary | ICD-10-CM | POA: Diagnosis not present

## 2021-06-13 DIAGNOSIS — I4821 Permanent atrial fibrillation: Secondary | ICD-10-CM | POA: Diagnosis not present

## 2021-06-13 DIAGNOSIS — I129 Hypertensive chronic kidney disease with stage 1 through stage 4 chronic kidney disease, or unspecified chronic kidney disease: Secondary | ICD-10-CM | POA: Diagnosis not present

## 2021-06-21 ENCOUNTER — Ambulatory Visit: Payer: Medicare Other | Admitting: Cardiovascular Disease

## 2021-07-04 ENCOUNTER — Other Ambulatory Visit: Payer: Self-pay

## 2021-07-04 ENCOUNTER — Ambulatory Visit: Payer: Medicare Other | Admitting: Psychiatry

## 2021-07-04 ENCOUNTER — Encounter: Payer: Self-pay | Admitting: Psychiatry

## 2021-07-04 DIAGNOSIS — F3110 Bipolar disorder, current episode manic without psychotic features, unspecified: Secondary | ICD-10-CM

## 2021-07-04 DIAGNOSIS — G251 Drug-induced tremor: Secondary | ICD-10-CM

## 2021-07-04 DIAGNOSIS — F5105 Insomnia due to other mental disorder: Secondary | ICD-10-CM

## 2021-07-04 DIAGNOSIS — F411 Generalized anxiety disorder: Secondary | ICD-10-CM | POA: Diagnosis not present

## 2021-07-04 MED ORDER — OLANZAPINE 20 MG PO TABS: 20.0000 mg | ORAL_TABLET | Freq: Every day | ORAL | 1 refills | Status: DC

## 2021-07-04 NOTE — Progress Notes (Signed)
Casey [name] [id number] 23-Sep-1945 75 y.o.    Subjective:   Patient ID:  Casey [name] is a 75 y.o. (DOB 1946/06/11) male.  Chief Complaint:  Chief Complaint  Patient presents with   Follow-up    Bipolar I disorder, most recent episode (or current) manic ([place])   Manic Behavior    HPI seen with wife Casey [name] presents for follow-up of bipolar disorder and anxiety and history of confusion  At visit  Dec 02, 2018.  He was having confusion and benztropine was stopped to see if that was the cause.  Also it was having balance issues and therefore Trileptal was switched all to nighttime 1200 mg nightly. Confusion much better per both of them with DC benztropine.  Balance better with change Trileptal.   visit was January 13, 2019 and he was doing well as noted above and he was encouraged not to make further med changes at that time.  visit April 13, 2019.  He was doing better with regard to depression than he had in quite some time on the Wellbutrin and no meds were changed.  He was having health problems with pulmonary effusion and fluid removed twice related to fall in January 2020.  seen September 13, 2019.  For residual depression the following decisions were made: Balance problem much improved with dosing Trileptal all in the evening. Depression under partial control & wife wants it to be better per his wife.  No mood swings.   Increase bupropion XL to 2 of the 150 mg tablets in the morning until the current bottle is gone. Then new RX will be 1 of the 300 mg tablets each morning.  November 08, 2019 appointment the following is noted:  Seen with wife, Casey [name]. Small amount of improvement with change.  Anxiety got worse.  Notices when he goes out.  Worries about social and political things.  Wife keeps him active at home.  Allerigies bother him..  No sig caffeine except tea when goes out.  Wife sees him as a little more active and initiative.  More productive.  Started some  cooking.  Getting out and up better.  A little more talkative.   "He wants to be hyper but we know what happens". Wife Casey [name] thinks   But for the last 2 years he's done nothing and laid around all the time.  He's a lot more active now. Big improvement. Went to church first time in 2 years. Plan:  For TRD, Pramipexole 0.25 mg tablet 1/2 twice daily for 1 week then 1 twice daily.  12/20/2019 appointment with the following noted:  Seen with wife. A little better motivated to get out but not to get in crowds.  Wife agrees more motivated and went to church once alone without pressure.  Wife says he's more talkative and much  Better interest.  Still not motivated to do things like mow the grass.  Others's have seen a difference. Concentration and memory are improved but not normal. No Se pramipexole.  Getting out of bed earlier and better now.. Wife admisters meds bc earlier he messsed them up. Plan: Wife has seen a big improvement therefore Partial improvement is clear from this so increase Pramipexole 0.375 mg tablet  twice daily  Anxiety is not fully managed.  Partly related to depression. Tremor is manageable.    02/15/20 appt with the following noted:  Send with wife Casey [name] Doing good.  Increased pramipexole as indicated.  Tolerating meds Wellbutrin XL  300 mg AM & Trileptal 1200 mg HS with it. Much better with depression.  Stays up more and speinding less time in bed.   Wife agrees he's much better and handling things better. 2 1/2 yo 4th cousin girl drowned in pond lately.  Big family. Wife pleased he handled it better. SE constipation ? Related. History Linzess but diarrhea. He's not highly motivated but it is better.  Depression is much improved.  Had been depressed for years. Plan no med changes  06/13/2020 appointment with the following noted: Pretty good but cardiologist noted today increased pulse needing treatment.  Uncontrolled afib even after ablation. Hard to tell about depression bc  of fatigue Dt heart problems. Wife said he did well Thanksgiving and participated more in conversation per wife. Plan: no med changes  09/06/2020 appointment with following noted: Remains on Wellbutrin XL 300 mg every morning, ginkgo biloba 40 mg twice daily, oxcarbazepine 1200 mg nightly, pramipexole 0.75 mg twice daily. Less sig health concerns. He thinks mood has been pretty good.  Consistent with meds. Wife thinks he stays tired and sleepy and wonders if can give him a stimulant.   Sleep good.  Will nap.  Doesn't bother him.  Walks dog 20 min in AM.  Not much initiative per wife.  Not depressed but when depressed he stayed in the bed lasting a couple of years.  History of OSA but lost weight and doesn't think he has apnea anymore.  No CPAP. Chronic afib. Not much caffeine. Plan: Wife& patient has seen a big improvement with Pramipexole 0.375 mg tablet  twice daily They ask consideration be given to the following: If his energy gets better by treating his tachycardia they would like to consider reducing the pramipexole or possibly eliminating it at follow-up.   For energy trial reduction pramipexole to 0.25 mg BID.  If gets more depressed call. Alternative of modafinil 100  12/04/2020 appointment with the following noted: Energy is better with awakening earlier at 15 and doing more and getting out more.  Going to bed later with 8 hour  Sleep. Mood fluctuates with more reacting to wife mainly.  More outspoken.  Per W he has to vent.  She thinks he is too focused on politics and issues.   Plan: Depression under control.  No mood swings.   Continue bupropion XL 300 mg tablets each morning. Continue Trileptal 600 mg 2 at HS. No higher DT anxiety risk and no traditional stimulants DT afib.  Wife & patient has seen a big improvement with Pramipexole and able to reduce to  0.25 mg tablet  twice daily  02/20/2021 appointment with the following noted:  seen with wife Prednisone made him real hyper.   Been off of it for at least a couple weeks.  Hyperverbal and hyperactive.  Hip pain interferes with sleep.  Sees doctor tomorrow. Average 3-4 hours at night and no naps bc busy.  No excessive spending but more than usual.  Denies appetite disturbance.  Patient reports that energy and motivation have been reduced.  Patient has difficulty with concentration and memory.  Patient denies any suicidal ideation.  W had MI in June. A/P: mild mania Wean Wellbutrin over 2 weeks. Continue Trileptal 600 mg 2 at HS. Wife & patient has seen a big improvement with Pramipexole and able to reduce to  0.25 mg tablet  twice daily. Continue it unless mania does not resolve with less bupropion.  03/26/2021 phone call: Casey [name] stated Verdie has slightly improved since  last week.He has not been able to sleep and was up at 2 am texting people in a disoriented state.His pcp put him on Ambien 10 mg and this has helped him sleep.However,Casey [name] stated he is still working him self too much.She said he is jittery and has to have something to do at all times.He does not want to slow down or get rest.She thinks he needs to cutback on the Mirapex as well. MD response:  I agree with his wife about reducing Mirapex from 1 tablet twice a day to half tablet twice a day for 1 week and if his symptoms are not resolved and stop the Mirapex.  04/18/2021 phone call with wife and MD: Patient is still manic after stopping Wellbutrin and pramipexole.  Wife's concern is hard to get him to settle down at night.  Reviewed with wife prior psychiatric medications which are multiple.  Typically one would use an antipsychotic in this case but she does not think that they worked in the past.  She says he has been on higher doses of Trileptal and tolerated it. Increase Trileptal to 600 mg every morning and 1200 mg nightly for mania.  Keep scheduled appointment.  05/02/2021 wife reports patient still manic: We cannot go higher in the Trileptal because of balance  issues.  Therefore olanzapine 10 mg nightly was started and we discussed the option of hospitalization.  We will try to get the patient in urgently to an appointment.  05/07/2021 appointment with the following noted: seen with wife, Casey [name] Not doing too well.  Trouble getting out of bed.  Wife says he's still up in middle of the night but he says he's sleeping better.  Sleep in recliner downstairs. She says he's still manic with hyperactive, hyper graphia but won't show her what he's writing. On olanzapine  No changes off Wellbutrin and pramipexole. Apparently more balance problems with increase Trileptal to 1800 mg daily.  Dropped back to 1200 mg HS. Admits to some confusion.  Has pulled off labels on bottle per wife. She thinks he's averaging 4-6 hours of sleep and a little better than it was. Wife said he started cussing and he has not done that in the past. Other episodes of inappropriate decision making.  He recognizes he's manic but can't control himself.  Hyperverbal, pressured.  In past had speech problems with mania also.  Neg neuro work up for stroke in the past. SE balance and tremor. Some trouble getting out of chair? Plan: For mania,Increase olanzapine to 15 mg PM Follow-up Friday for urgent visit  05/11/2021 appointment with the following noted: Calming down a little. He thinks he's sleeping better.  She says he needs whole Ambien with olanzapine and last night still not sleeping.  He woke her with complaints of back pain. She's not seeing SE and nor is he.  She's not seeing much change  He says back pain is complicating his sleep and his problem. He tries to stay active.   She says he talks incessantly but not as much to her. She says major life events have triggered mania in him in the past. She notices him making poor decisions. Plan: Increase olanzapine to 20 mg PM  05/24/2021 appointment with the following noted: with Casey [name] A week ago went of to doctor's office alone and  wife called bc he was agitated. Per wife still going wide open but is sleeping better. Yesterday had fever and talking out of his head and dx UTI. He still  feels driven to do chores but distractible and inefficient.   Casey does not see improvement and nor does wife.   7 hour sleep lately. Plan: Increase olanzapine to 30 mg in evening Reduce oxcarbazepine to 1 and 1/2 tablets in the evening.  06/04/2021 appointment: Seen with wife Sleeping more 7-9 hours.   Per wife still some confusion and still short tempered.  Less pressured sleep. He won't go to bed when sleepy and waits too long to get in bed and is weak and cannot do it by himself. Wife agrees he's less "wide open" than before and will be able to sit and watch TV show now. B died yesterday. Plan: Continue olanzapine to 30 mg in evening Reduce oxcarbazepine  again to 1 tablets in the evening for 1 week, then 1/2 tablet at night for a week then stop it.  07/04/21 appt noted:  seen with wife Casey [name] Hyper-ness is better.  Some trouble with concentration but able to make peanut butter fudge. Problems with afib.   Sleeping a lot and some instability.  Goes to sleep in chair.  Better with irritability.  No longer talking constantly but still some in spells.   Not depressed.   No change in balance off oxcarbazepine. Wife said last week mixed up meds and got oversedated.  She discovered the option.  Past Psychiatric Medication Trials: Seroquel 300, olanzapine 30, perphenazine, risperidone,  Lithium helped but SE kidneys and prostate,  Latuda,  Abilify,  alprazolam,  Depakote, Trileptal, carbamazepine, lamotrigine caused nausea, citalopram, sertraline 150,   Wellbutrin 300 anxiety,  Pramipexole 0.375 BID Benztropine confusion        1 prior psych hosp depression                                                    Review of Systems:  Review of Systems  Constitutional:  Positive for fatigue.  Eyes:  Positive for visual disturbance.   Gastrointestinal:  Positive for constipation.  Musculoskeletal:  Positive for arthralgias, back pain and gait problem.  Skin:  Negative for rash.       chronic  Neurological:  Positive for tremors and weakness.       Balance px longterm no worse  Psychiatric/Behavioral:  Positive for agitation and decreased concentration. Negative for behavioral problems, confusion, dysphoric mood, hallucinations, self-injury, sleep disturbance and suicidal ideas. The patient is nervous/anxious and is hyperactive.    Medications: I have reviewed the patient's current medications.  Current Outpatient Medications  Medication Sig Dispense Refill   acetaminophen (TYLENOL) 500 MG tablet Take 1,000 mg by mouth every 6 (six) hours as needed for headache.     Albuterol Sulfate 108 (90 Base) MCG/ACT AEPB Inhale 1-2 puffs into the lungs as needed.     bisacodyl (DULCOLAX) 5 MG EC tablet Take 5-15 mg by mouth daily as needed for moderate constipation.     cholecalciferol (VITAMIN Casey) 1000 UNITS tablet Take 2,000 Units by mouth daily.     clotrimazole (LOTRIMIN) 1 % cream Apply 1 application topically 2 (two) times daily.     fluticasone (FLONASE) 50 MCG/ACT nasal spray Place 2 sprays into both nostrils daily as needed.  11   furosemide (LASIX) 20 MG tablet Take 40 mg by mouth daily.     Ginkgo Biloba 40 MG TABS Take 40 mg by mouth 2 (  two) times daily.     levothyroxine (SYNTHROID, LEVOTHROID) 25 MCG tablet Take 25 mcg by mouth daily before breakfast.     linaclotide (LINZESS) 72 MCG capsule Take 1 capsule (72 mcg total) by mouth daily before breakfast. (Patient taking differently: Take 72 mcg by mouth daily before breakfast. As needed) 30 capsule 2   metoprolol succinate (TOPROL-XL) 50 MG 24 hr tablet Take 25 mg by mouth daily. Take with or immediately following a meal. Takes 1/2 tablet.     pantoprazole (PROTONIX) 40 MG tablet TAKE ONE TABLET BY MOUTH ONCE DAILY 30 tablet 11   rivaroxaban (XARELTO) 20 MG TABS tablet  Take 20 mg by mouth daily after supper.      SUPER B COMPLEX/C PO Take by mouth daily.     traMADol-acetaminophen (ULTRACET) 37.5-325 MG tablet Take 1-2 tablets by mouth every 4 (four) hours as needed. 1-2 tabs     OLANZapine (ZYPREXA) 20 MG tablet Take 1 tablet (20 mg total) by mouth at bedtime. 30 tablet 1   No current facility-administered medications for this visit.    Medication Side Effects: resolved  Allergies:  Allergies  Allergen Reactions   Amlodipine Other (See Comments)    Stopped due to kidney issues   Vicodin [Hydrocodone-Acetaminophen] Other (See Comments)    Patient is unsure of reaction    Past Medical History:  Diagnosis Date   Arthritis    Atrial fibrillation ([place]) 01/12/2010   2D Echo EF=>55%   Bipolar disorder ([facility])    Chronic back pain    Depression    Dysrhythmia    AFib   History of colonic polyps    History of gout    Hyperlipidemia    Hypertension    Hypothyroidism    Morbid obesity ([place])    OSA (obstructive sleep apnea)    AHI was 27.62hr RDI was 34.3 hr REM 0.00hr; had CPAP years ago but no longer uses.   Prostatitis    Tubular adenoma of colon 01/2016    Family History  Problem Relation Age of Onset   Diabetes Mother    Heart failure Mother    Hypertension Mother    Hypertension Father    Cancer Paternal Grandmother    Stroke Sister    Liver disease Brother    Vascular Disease Brother    Arthritis Brother    Colon cancer Neg Hx     Social History   Socioeconomic History   Marital status: Married    Spouse name: Not on file   Number of children: Not on file   Years of education: Not on file   Highest education level: Not on file  Occupational History   Not on file  Tobacco Use   Smoking status: Never   Smokeless tobacco: Never  Substance and Sexual Activity   Alcohol use: No    Alcohol/week: 0.0 standard drinks   Drug use: No   Sexual activity: Yes    Birth control/protection: None  Other Topics Concern   Not on  file  Social History Narrative   Not on file   Social Determinants of Health   Financial Resource Strain: Not on file  Food Insecurity: Not on file  Transportation Needs: Not on file  Physical Activity: Not on file  Stress: Not on file  Social Connections: Not on file  Intimate Partner Violence: Not on file    Past Medical History, Surgical history, Social history, and Family history were reviewed and updated as appropriate.  Please see review of systems for further details on the patient's review from today.   Objective:   Physical Exam:  There were no vitals taken for this visit.  Physical Exam Constitutional:      General: He is not in acute distress. Musculoskeletal:        General: No deformity.  Neurological:     Mental Status: He is alert and oriented to person, place, and time.     Coordination: Coordination abnormal.     Gait: Gait abnormal.     Comments: Mild rocking motions legs only  Psychiatric:        Attention and Perception: He is attentive. He does not perceive auditory hallucinations.        Mood and Affect: Mood is not anxious or depressed. Affect is not labile, blunt, angry or inappropriate.        Speech: Speech is tangential. Speech is not rapid and pressured.        Behavior: Behavior normal.        Thought Content: Thought content normal. Thought content is not delusional. Thought content does not include homicidal or suicidal ideation. Thought content does not include suicidal plan.        Cognition and Memory: Cognition normal.        Judgment: Judgment normal.     Comments: Insight intact. No auditory or visual hallucinations. No delusions.  Mania improving per family Less Pressured speech. Emotional lability not present in office    Lab Review:     Component Value Date/Time   NA 141 12/28/2018 0941   K 4.0 12/28/2018 0941   CL 104 12/28/2018 0941   CO2 22 12/28/2018 0941   GLUCOSE 88 12/28/2018 0941   GLUCOSE 111 (H) 06/30/2018  1311   BUN 16 12/28/2018 0941   CREATININE 1.30 (H) 12/28/2018 0941   CALCIUM 9.4 12/28/2018 0941   PROT 6.6 12/28/2018 0941   ALBUMIN 4.5 12/28/2018 0941   AST 21 12/28/2018 0941   ALT 22 12/28/2018 0941   ALKPHOS 115 12/28/2018 0941   BILITOT 0.7 12/28/2018 0941   GFRNONAA 55 (L) 12/28/2018 0941   GFRAA 63 12/28/2018 0941       Component Value Date/Time   WBC 8.1 12/28/2018 0941   WBC 5.4 06/30/2018 1311   RBC 4.81 12/28/2018 0941   RBC 4.52 06/30/2018 1311   HGB 15.2 12/28/2018 0941   HCT 42.9 12/28/2018 0941   PLT 186 12/28/2018 0941   MCV 89 12/28/2018 0941   MCH 31.6 12/28/2018 0941   MCH 31.9 06/30/2018 1311   MCHC 35.4 12/28/2018 0941   MCHC 32.8 06/30/2018 1311   RDW 12.9 12/28/2018 0941   LYMPHSABS 1.1 12/28/2018 0941   MONOABS 0.5 06/30/2018 1311   EOSABS 0.1 12/28/2018 0941   BASOSABS 0.0 12/28/2018 0941    No results found for: POCLITH, LITHIUM   No results found for: PHENYTOIN, PHENOBARB, VALPROATE, CBMZ   .res Assessment: Plan:    Fraser was seen today for follow-up and manic behavior.  Diagnoses and all orders for this visit:  Bipolar I disorder, most recent episode (or current) manic ([facility]) -     OLANZapine (ZYPREXA) 20 MG tablet; Take 1 tablet (20 mg total) by mouth at bedtime.  Generalized anxiety disorder  Tremor due to multiple drugs  Insomnia due to mental condition   History pseudodementia with depression which is resolved.  Dx bipolar at 75 yo.  Bipolar depression has flipped into mania from prednisone.  Patient  is very complicated because of weakness requiring a walker and medical problems as well as ongoing mania.  Disc this at length and risk of crashing into depression if we can't ease him down.   Pt is no longer manic and is not depressed..    Prednisone triggered some mania in recently  and persisted off prednisone.    Improved  with Increase olanzapine to 30 mg PM and off the oxcarbazepine.  Continue olanzapine to 30 mg in  evening vs reduce. Reduce olanzapine to 20 mg PM now that mania is largely resolved.  Discussed side effect of each medication Fall precautions.  Follow-up 4 weeks  Lynder Parents, MD, DFAPA   Please see After Visit Summary for patient specific instructions.  Future Appointments  Date Time Provider [place]  08/06/2021 11:00 AM Cottle, Billey Co., MD [facility] None  08/14/2021  3:35 PM Duke, Tami Lin, PA [facility]    No orders of the defined types were placed in this encounter.      -------------------------------

## 2021-07-04 NOTE — Patient Instructions (Signed)
Reduce olanzapine to 20 mg only daily at 8 pm

## 2021-07-13 ENCOUNTER — Telehealth: Payer: Self-pay

## 2021-07-13 ENCOUNTER — Telehealth: Payer: Self-pay | Admitting: *Deleted

## 2021-07-13 NOTE — Telephone Encounter (Signed)
° °  Pre-operative Risk Assessment    Patient Name: Casey [name]  DOB: 02-15-1946 MRN: [id number]      Request for Surgical Clearance    Procedure:   COLONOSCOPY  Date of Surgery:  Clearance TBD                                 Surgeon:  DR. Manus Rudd Surgeon's Group or Practice Name:  [facility] Phone number:  [phone] Fax number:  [phone]   Type of Clearance Requested:   - Medical  - Pharmacy:  Hold Rivaroxaban (Xarelto)     Type of Anesthesia:   PROPOFOL ASA III   Additional requests/questions:    Jiles Prows   07/13/2021, 11:21 AM

## 2021-07-13 NOTE — Telephone Encounter (Signed)
Pt has appt with Doreene Adas, [facility] 08/14/21. I have added pre op clearance needed to appt notes. I will forward notes to [facility] for upcoming appt. I did look to see if I could get the pt in sooner, unfortunately no sooner appts.

## 2021-07-13 NOTE — Telephone Encounter (Signed)
Patient with diagnosis of A Fib on Xarelto for anticoagulation.  Patient has not been seen by cardiology in over a year.  Procedure: colonoscopy Date of procedure: TBD   CHA2DS2-VASc Score = 2  This indicates a 2.2% annual risk of stroke. The patient's score is based upon: CHF History: 0 HTN History: 1 Diabetes History: 0 Stroke History: 0 Vascular Disease History: 0 Age Score: 1 Gender Score: 0    CrCl 89 mL/min Platelet count 222k  Per office protocol, patient can hold Xarelto for 2 days prior to procedure.

## 2021-07-13 NOTE — Telephone Encounter (Signed)
Faxed Cardiac Clearance and Medication hold to [facility] preop team.

## 2021-07-13 NOTE — Telephone Encounter (Signed)
° °  Name: Casey [name]  DOB: 04/24/1946  MRN: [id number]  Primary Cardiologist: None  Chart reviewed as part of pre-operative protocol coverage. Because of Grayling Schranz Howley's past medical history and time since last visit, he will require a follow-up visit in order to better assess preoperative cardiovascular risk.  Pre-op covering staff: - Please schedule appointment and call patient to inform them. If patient already had an upcoming appointment within acceptable timeframe, please add "pre-op clearance" to the appointment notes so provider is aware. - Please contact requesting surgeon's office via preferred method (i.e, phone, fax) to inform them of need for appointment prior to surgery.  If applicable, this message will also be routed to pharmacy pool and/or primary cardiologist for input on holding anticoagulant/antiplatelet agent as requested below so that this information is available to the clearing provider at time of patient's appointment.   Christell Faith, PA-C  07/13/2021, 11:29 AM

## 2021-08-06 ENCOUNTER — Encounter: Payer: Self-pay | Admitting: Psychiatry

## 2021-08-06 ENCOUNTER — Ambulatory Visit (INDEPENDENT_AMBULATORY_CARE_PROVIDER_SITE_OTHER): Payer: Medicare Other | Admitting: Psychiatry

## 2021-08-06 ENCOUNTER — Other Ambulatory Visit: Payer: Self-pay

## 2021-08-06 DIAGNOSIS — F411 Generalized anxiety disorder: Secondary | ICD-10-CM | POA: Diagnosis not present

## 2021-08-06 DIAGNOSIS — G2581 Restless legs syndrome: Secondary | ICD-10-CM | POA: Diagnosis not present

## 2021-08-06 DIAGNOSIS — G251 Drug-induced tremor: Secondary | ICD-10-CM | POA: Diagnosis not present

## 2021-08-06 DIAGNOSIS — F5105 Insomnia due to other mental disorder: Secondary | ICD-10-CM

## 2021-08-06 DIAGNOSIS — F3110 Bipolar disorder, current episode manic without psychotic features, unspecified: Secondary | ICD-10-CM

## 2021-08-06 MED ORDER — OLANZAPINE 15 MG PO TABS: 15.0000 mg | ORAL_TABLET | Freq: Every day | ORAL | 1 refills | Status: DC

## 2021-08-06 NOTE — Patient Instructions (Signed)
Stop Caffeine Reduce olanzapine to 15 mg in evening

## 2021-08-06 NOTE — Progress Notes (Signed)
Casey [name] [id number] August 15, 1945 76 y.o.    Subjective:   Patient ID:  Casey [name] is a 76 y.o. (DOB 01/04/1946) male.  Chief Complaint:  Chief Complaint  Patient presents with   Follow-up   Manic Behavior    HPI seen with wife Casey [name] presents for follow-up of bipolar disorder and anxiety and history of confusion  At visit  Dec 02, 2018.  He was having confusion and benztropine was stopped to see if that was the cause.  Also it was having balance issues and therefore Trileptal was switched all to nighttime 1200 mg nightly. Confusion much better per both of them with DC benztropine.  Balance better with change Trileptal.   visit was January 13, 2019 and he was doing well as noted above and he was encouraged not to make further med changes at that time.  visit April 13, 2019.  He was doing better with regard to depression than he had in quite some time on the Wellbutrin and no meds were changed.  He was having health problems with pulmonary effusion and fluid removed twice related to fall in January 2020.  seen September 13, 2019.  For residual depression the following decisions were made: Balance problem much improved with dosing Trileptal all in the evening. Depression under partial control & wife wants it to be better per his wife.  No mood swings.   Increase bupropion XL to 2 of the 150 mg tablets in the morning until the current bottle is gone. Then new RX will be 1 of the 300 mg tablets each morning.  November 08, 2019 appointment the following is noted:  Seen with wife, Casey [name]. Small amount of improvement with change.  Anxiety got worse.  Notices when he goes out.  Worries about social and political things.  Wife keeps him active at home.  Allerigies bother him..  No sig caffeine except tea when goes out.  Wife sees him as a little more active and initiative.  More productive.  Started some cooking.  Getting out and up better.  A little more talkative.   "He wants  to be hyper but we know what happens". Wife Casey [name] thinks   But for the last 2 years he's done nothing and laid around all the time.  He's a lot more active now. Big improvement. Went to church first time in 2 years. Plan:  For TRD, Pramipexole 0.25 mg tablet 1/2 twice daily for 1 week then 1 twice daily.  12/20/2019 appointment with the following noted:  Seen with wife. A little better motivated to get out but not to get in crowds.  Wife agrees more motivated and went to church once alone without pressure.  Wife says he's more talkative and much  Better interest.  Still not motivated to do things like mow the grass.  Others's have seen a difference. Concentration and memory are improved but not normal. No Se pramipexole.  Getting out of bed earlier and better now.. Wife admisters meds bc earlier he messsed them up. Plan: Wife has seen a big improvement therefore Partial improvement is clear from this so increase Pramipexole 0.375 mg tablet  twice daily  Anxiety is not fully managed.  Partly related to depression. Tremor is manageable.    02/15/20 appt with the following noted:  Send with wife Casey [name] Doing good.  Increased pramipexole as indicated.  Tolerating meds Wellbutrin XL 300 mg AM & Trileptal 1200 mg HS with it. Much better with  depression.  Stays up more and speinding less time in bed.   Wife agrees he's much better and handling things better. 2 1/2 yo 4th cousin girl drowned in pond lately.  Big family. Wife pleased he handled it better. SE constipation ? Related. History Linzess but diarrhea. He's not highly motivated but it is better.  Depression is much improved.  Had been depressed for years. Plan no med changes  06/13/2020 appointment with the following noted: Pretty good but cardiologist noted today increased pulse needing treatment.  Uncontrolled afib even after ablation. Hard to tell about depression bc of fatigue Dt heart problems. Wife said he did well Thanksgiving and  participated more in conversation per wife. Plan: no med changes  09/06/2020 appointment with following noted: Remains on Wellbutrin XL 300 mg every morning, ginkgo biloba 40 mg twice daily, oxcarbazepine 1200 mg nightly, pramipexole 0.75 mg twice daily. Less sig health concerns. He thinks mood has been pretty good.  Consistent with meds. Wife thinks he stays tired and sleepy and wonders if can give him a stimulant.   Sleep good.  Will nap.  Doesn't bother him.  Walks dog 20 min in AM.  Not much initiative per wife.  Not depressed but when depressed he stayed in the bed lasting a couple of years.  History of OSA but lost weight and doesn't think he has apnea anymore.  No CPAP. Chronic afib. Not much caffeine. Plan: Wife& patient has seen a big improvement with Pramipexole 0.375 mg tablet  twice daily They ask consideration be given to the following: If his energy gets better by treating his tachycardia they would like to consider reducing the pramipexole or possibly eliminating it at follow-up.   For energy trial reduction pramipexole to 0.25 mg BID.  If gets more depressed call. Alternative of modafinil 100  12/04/2020 appointment with the following noted: Energy is better with awakening earlier at 89 and doing more and getting out more.  Going to bed later with 8 hour  Sleep. Mood fluctuates with more reacting to wife mainly.  More outspoken.  Per W he has to vent.  She thinks he is too focused on politics and issues.   Plan: Depression under control.  No mood swings.   Continue bupropion XL 300 mg tablets each morning. Continue Trileptal 600 mg 2 at HS. No higher DT anxiety risk and no traditional stimulants DT afib.  Wife & patient has seen a big improvement with Pramipexole and able to reduce to  0.25 mg tablet  twice daily  02/20/2021 appointment with the following noted:  seen with wife Prednisone made him real hyper.  Been off of it for at least a couple weeks.  Hyperverbal and  hyperactive.  Hip pain interferes with sleep.  Sees doctor tomorrow. Average 3-4 hours at night and no naps bc busy.  No excessive spending but more than usual.  Denies appetite disturbance.  Patient reports that energy and motivation have been reduced.  Patient has difficulty with concentration and memory.  Patient denies any suicidal ideation.  W had MI in June. A/P: mild mania Wean Wellbutrin over 2 weeks. Continue Trileptal 600 mg 2 at HS. Wife & patient has seen a big improvement with Pramipexole and able to reduce to  0.25 mg tablet  twice daily. Continue it unless mania does not resolve with less bupropion.  03/26/2021 phone call: Casey [name] stated Nyzaiah has slightly improved since last week.He has not been able to sleep and was up at 2  am texting people in a disoriented state.His pcp put him on Ambien 10 mg and this has helped him sleep.However,linda stated he is still working him self too much.She said he is jittery and has to have something to do at all times.He does not want to slow down or get rest.She thinks he needs to cutback on the Mirapex as well. MD response:  I agree with his wife about reducing Mirapex from 1 tablet twice a day to half tablet twice a day for 1 week and if his symptoms are not resolved and stop the Mirapex.  04/18/2021 phone call with wife and MD: Patient is still manic after stopping Wellbutrin and pramipexole.  Wife's concern is hard to get him to settle down at night.  Reviewed with wife prior psychiatric medications which are multiple.  Typically one would use an antipsychotic in this case but she does not think that they worked in the past.  She says he has been on higher doses of Trileptal and tolerated it. Increase Trileptal to 600 mg every morning and 1200 mg nightly for mania.  Keep scheduled appointment.  05/02/2021 wife reports patient still manic: We cannot go higher in the Trileptal because of balance issues.  Therefore olanzapine 10 mg nightly was started and  we discussed the option of hospitalization.  We will try to get the patient in urgently to an appointment.  05/07/2021 appointment with the following noted: seen with wife, Casey [name] Not doing too well.  Trouble getting out of bed.  Wife says he's still up in middle of the night but he says he's sleeping better.  Sleep in recliner downstairs. She says he's still manic with hyperactive, hyper graphia but won't show her what he's writing. On olanzapine  No changes off Wellbutrin and pramipexole. Apparently more balance problems with increase Trileptal to 1800 mg daily.  Dropped back to 1200 mg HS. Admits to some confusion.  Has pulled off labels on bottle per wife. She thinks he's averaging 4-6 hours of sleep and a little better than it was. Wife said he started cussing and he has not done that in the past. Other episodes of inappropriate decision making.  He recognizes he's manic but can't control himself.  Hyperverbal, pressured.  In past had speech problems with mania also.  Neg neuro work up for stroke in the past. SE balance and tremor. Some trouble getting out of chair? Plan: For mania,Increase olanzapine to 15 mg PM Follow-up Friday for urgent visit  05/11/2021 appointment with the following noted: Calming down a little. He thinks he's sleeping better.  She says he needs whole Ambien with olanzapine and last night still not sleeping.  He woke her with complaints of back pain. She's not seeing SE and nor is he.  She's not seeing much change  He says back pain is complicating his sleep and his problem. He tries to stay active.   She says he talks incessantly but not as much to her. She says major life events have triggered mania in him in the past. She notices him making poor decisions. Plan: Increase olanzapine to 20 mg PM  05/24/2021 appointment with the following noted: with D Nevin Bloodgood A week ago went of to doctor's office alone and wife called bc he was agitated. Per wife still going wide  open but is sleeping better. Yesterday had fever and talking out of his head and dx UTI. He still feels driven to do chores but distractible and inefficient.   D does  not see improvement and nor does wife.   7 hour sleep lately. Plan: Increase olanzapine to 30 mg in evening Reduce oxcarbazepine to 1 and 1/2 tablets in the evening.  06/04/2021 appointment: Seen with wife Sleeping more 7-9 hours.   Per wife still some confusion and still short tempered.  Less pressured sleep. He won't go to bed when sleepy and waits too long to get in bed and is weak and cannot do it by himself. Wife agrees he's less "wide open" than before and will be able to sit and watch TV show now. B died yesterday. Plan: Continue olanzapine to 30 mg in evening Reduce oxcarbazepine  again to 1 tablets in the evening for 1 week, then 1/2 tablet at night for a week then stop it.  07/04/21 appt noted:  seen with wife Casey [name] Hyper-ness is better.  Some trouble with concentration but able to make peanut butter fudge. Problems with afib.   Sleeping a lot and some instability.  Goes to sleep in chair.  Better with irritability.  No longer talking constantly but still some in spells.   Not depressed.   No change in balance off oxcarbazepine. Wife said last week mixed up meds and got oversedated.  She discovered the option. Plan: Reduce olanzapine to 20 mg PM now that mania is largely resolved.  08/06/2021 appointment with the following noted: Only psych med is reduced olanzapine 20 mg HS RLS and fidgety.  All day.  Can sit for an hour.  Uncomfortable when sitting. Stopped Ginko and wife noticed RLS more.  She restarted it.  No longer rocking back and forth. Caffeine tea at lunch.  No coffee..  [place] some. RLS recently started. Bipolar sx great per wife.  Not wanting to go out much. CO tiredness.  Falls asleep in recliner.  Sleep all night. Eats sweets all his life.  Past Psychiatric Medication Trials: Seroquel 300,  olanzapine 30, perphenazine, risperidone,  Lithium helped but SE kidneys and prostate,  Latuda,  Abilify,  alprazolam,  Depakote, Trileptal, carbamazepine, lamotrigine caused nausea, citalopram, sertraline 150,   Wellbutrin 300 anxiety,  Pramipexole 0.375 BID Benztropine confusion        1 prior psych hosp depression                                                    Review of Systems:  Review of Systems  Constitutional:  Positive for fatigue.  Eyes:  Positive for visual disturbance.  Gastrointestinal:  Positive for constipation.  Musculoskeletal:  Positive for arthralgias, back pain and gait problem.  Skin:  Negative for rash.       chronic  Neurological:  Positive for tremors and weakness.       Balance px longterm no worse RLS day and PM  Psychiatric/Behavioral:  Positive for decreased concentration. Negative for agitation, behavioral problems, confusion, dysphoric mood, hallucinations, self-injury, sleep disturbance and suicidal ideas. The patient is nervous/anxious. The patient is not hyperactive.    Medications: I have reviewed the patient's current medications.  Current Outpatient Medications  Medication Sig Dispense Refill   acetaminophen (TYLENOL) 500 MG tablet Take 1,000 mg by mouth every 6 (six) hours as needed for headache.     Albuterol Sulfate 108 (90 Base) MCG/ACT AEPB Inhale 1-2 puffs into the lungs as needed.     bisacodyl (DULCOLAX) 5  MG EC tablet Take 5-15 mg by mouth daily as needed for moderate constipation.     cholecalciferol (VITAMIN D) 1000 UNITS tablet Take 2,000 Units by mouth daily.     clotrimazole (LOTRIMIN) 1 % cream Apply 1 application topically 2 (two) times daily.     fluticasone (FLONASE) 50 MCG/ACT nasal spray Place 2 sprays into both nostrils daily as needed.  11   furosemide (LASIX) 20 MG tablet Take 40 mg by mouth daily.     Ginkgo Biloba 40 MG TABS Take 40 mg by mouth 2 (two) times daily.     levothyroxine (SYNTHROID, LEVOTHROID) 25 MCG tablet  Take 25 mcg by mouth daily before breakfast.     linaclotide (LINZESS) 72 MCG capsule Take 1 capsule (72 mcg total) by mouth daily before breakfast. (Patient taking differently: Take 72 mcg by mouth daily before breakfast. As needed) 30 capsule 2   metoprolol succinate (TOPROL-XL) 50 MG 24 hr tablet Take 25 mg by mouth daily. Take with or immediately following a meal. Takes 1/2 tablet.     pantoprazole (PROTONIX) 40 MG tablet TAKE ONE TABLET BY MOUTH ONCE DAILY 30 tablet 11   rivaroxaban (XARELTO) 20 MG TABS tablet Take 20 mg by mouth daily after supper.      SUPER B COMPLEX/C PO Take by mouth daily.     traMADol-acetaminophen (ULTRACET) 37.5-325 MG tablet Take 1-2 tablets by mouth every 4 (four) hours as needed. 1-2 tabs     OLANZapine (ZYPREXA) 15 MG tablet Take 1 tablet (15 mg total) by mouth at bedtime. 30 tablet 1   No current facility-administered medications for this visit.    Medication Side Effects: resolved  Allergies:  Allergies  Allergen Reactions   Amlodipine Other (See Comments)    Stopped due to kidney issues   Vicodin [Hydrocodone-Acetaminophen] Other (See Comments)    Patient is unsure of reaction    Past Medical History:  Diagnosis Date   Arthritis    Atrial fibrillation ([place]) 01/12/2010   2D Echo EF=>55%   Bipolar disorder (HCC)    Chronic back pain    Depression    Dysrhythmia    AFib   History of colonic polyps    History of gout    Hyperlipidemia    Hypertension    Hypothyroidism    Morbid obesity ([place])    OSA (obstructive sleep apnea)    AHI was 27.62hr RDI was 34.3 hr REM 0.00hr; had CPAP years ago but no longer uses.   Prostatitis    Tubular adenoma of colon 01/2016    Family History  Problem Relation Age of Onset   Diabetes Mother    Heart failure Mother    Hypertension Mother    Hypertension Father    Cancer Paternal Grandmother    Stroke Sister    Liver disease Brother    Vascular Disease Brother    Arthritis Brother    Colon cancer  Neg Hx     Social History   Socioeconomic History   Marital status: Married    Spouse name: Not on file   Number of children: Not on file   Years of education: Not on file   Highest education level: Not on file  Occupational History   Not on file  Tobacco Use   Smoking status: Never   Smokeless tobacco: Never  Substance and Sexual Activity   Alcohol use: No    Alcohol/week: 0.0 standard drinks   Drug use: No   Sexual activity:  Yes    Birth control/protection: None  Other Topics Concern   Not on file  Social History Narrative   Not on file   Social Determinants of Health   Financial Resource Strain: Not on file  Food Insecurity: Not on file  Transportation Needs: Not on file  Physical Activity: Not on file  Stress: Not on file  Social Connections: Not on file  Intimate Partner Violence: Not on file    Past Medical History, Surgical history, Social history, and Family history were reviewed and updated as appropriate.   Please see review of systems for further details on the patient's review from today.   Objective:   Physical Exam:  There were no vitals taken for this visit.  Physical Exam Constitutional:      General: He is not in acute distress. Musculoskeletal:        General: No deformity.  Neurological:     Mental Status: He is alert and oriented to person, place, and time.     Coordination: Coordination abnormal.     Gait: Gait abnormal.     Comments: Mild rocking motions legs only  Psychiatric:        Attention and Perception: He is attentive. He does not perceive auditory hallucinations.        Mood and Affect: Mood is not anxious or depressed. Affect is not labile, blunt, angry or inappropriate.        Speech: Speech is not rapid and pressured or tangential.        Behavior: Behavior normal.        Thought Content: Thought content normal. Thought content is not delusional. Thought content does not include homicidal or suicidal ideation. Thought  content does not include suicidal plan.        Cognition and Memory: Cognition normal.        Judgment: Judgment normal.     Comments: Insight intact. No auditory or visual hallucinations. No delusions.  Mania resolved No Pressured speech. Emotional lability not present in office    Lab Review:     Component Value Date/Time   NA 141 12/28/2018 0941   K 4.0 12/28/2018 0941   CL 104 12/28/2018 0941   CO2 22 12/28/2018 0941   GLUCOSE 88 12/28/2018 0941   GLUCOSE 111 (H) 06/30/2018 1311   BUN 16 12/28/2018 0941   CREATININE 1.30 (H) 12/28/2018 0941   CALCIUM 9.4 12/28/2018 0941   PROT 6.6 12/28/2018 0941   ALBUMIN 4.5 12/28/2018 0941   AST 21 12/28/2018 0941   ALT 22 12/28/2018 0941   ALKPHOS 115 12/28/2018 0941   BILITOT 0.7 12/28/2018 0941   GFRNONAA 55 (L) 12/28/2018 0941   GFRAA 63 12/28/2018 0941       Component Value Date/Time   WBC 8.1 12/28/2018 0941   WBC 5.4 06/30/2018 1311   RBC 4.81 12/28/2018 0941   RBC 4.52 06/30/2018 1311   HGB 15.2 12/28/2018 0941   HCT 42.9 12/28/2018 0941   PLT 186 12/28/2018 0941   MCV 89 12/28/2018 0941   MCH 31.6 12/28/2018 0941   MCH 31.9 06/30/2018 1311   MCHC 35.4 12/28/2018 0941   MCHC 32.8 06/30/2018 1311   RDW 12.9 12/28/2018 0941   LYMPHSABS 1.1 12/28/2018 0941   MONOABS 0.5 06/30/2018 1311   EOSABS 0.1 12/28/2018 0941   BASOSABS 0.0 12/28/2018 0941    No results found for: POCLITH, LITHIUM   No results found for: PHENYTOIN, PHENOBARB, VALPROATE, CBMZ   .res Assessment: Plan:  [place] was seen today for follow-up and manic behavior.  Diagnoses and all orders for this visit:  Bipolar I disorder, most recent episode (or current) manic (HCC) -     OLANZapine (ZYPREXA) 15 MG tablet; Take 1 tablet (15 mg total) by mouth at bedtime.  Secondary restless legs syndrome  Generalized anxiety disorder  Tremor due to multiple drugs  Insomnia due to mental condition   History pseudodementia with depression which is  resolved.  Dx bipolar at 76 yo.  Bipolar depression has flipped into mania from prednisone.  Patient is very complicated because of weakness and medical problems.   Off walker and using cane Disc this at length and risk of crashing into depression if we can't ease him down.   Pt is no longer manic and is not depressed..    Prednisone triggered some mania in recently  and persisted off prednisone.    RLS worse.  Stop caffeine.  Disc dx restlessness. Drowsy and tired.  No longer manic. Reduce olanzapine to 15 mg earlier in PM  Stop caffeine  Improved  with Increase olanzapine to 30 mg PM and off the oxcarbazepine.  Discussed side effect of each medication Fall precautions.  Follow-up 4 weeks  Lynder Parents, MD, DFAPA   Please see After Visit Summary for patient specific instructions.  Future Appointments  Date Time Provider [place]  08/14/2021  3:35 PM Ledora Bottcher, [place] [facility]  09/06/2021 10:30 AM Cottle, [place]., MD [facility] None    No orders of the defined types were placed in this encounter.      -------------------------------

## 2021-08-13 NOTE — Progress Notes (Signed)
Cardiology Office Note:    Date:  08/14/2021   ID:  Casey [name], DOB 1945/07/27, MRN [id number]  PCP:  Sharilyn Sites, MD   [facility] Providers Cardiologist:  Sanda Klein, MD Cardiology APP:  Ledora Bottcher, PA { Referring MD: Sharilyn Sites, MD   Chief Complaint  Patient presents with   Follow-up   Pre-op Exam    History of Present Illness:    Casey [name] is a 76 y.o. male with a hx of permanent atrial fibrillation without structural heart abnormalities.  He also has OSA and bipolar disorder type I.  He is anticoagulated with Xarelto.  He is noncompliant with his CPAP. He was last seen in clinic with Dr. Sallyanne Kuster 06/13/20 and was doing well at that time. Stress test in 2011 was nonischemic. Echo 2015 with LVEF 55-60%, no WMA, and mild biatrial enlargement.    He presents today for preoperative risk evaluation for colonoscopy. No bleeding problems on xarelto. He takes lasix PRN, has not taken in 2-3 weeks. He sates he had lower extremity swelling, but providers felt this was due to a medication reaction.  He has had a GI bleed following a previous colonoscopy, so he is understandably nervous about colonoscopies. He is able to walk a half mile and can climb a flight of stairs without angina.    Past Medical History:  Diagnosis Date   Arthritis    Atrial fibrillation ([place]) 01/12/2010   2D Echo EF=>55%   Bipolar disorder (HCC)    Chronic back pain    Depression    Dysrhythmia    AFib   History of colonic polyps    History of gout    Hyperlipidemia    Hypertension    Hypothyroidism    Morbid obesity ([place])    OSA (obstructive sleep apnea)    AHI was 27.62hr RDI was 34.3 hr REM 0.00hr; had CPAP years ago but no longer uses.   Prostatitis    Tubular adenoma of colon 01/2016    Past Surgical History:  Procedure Laterality Date   APPENDECTOMY  1959   asthma     CATARACT EXTRACTION Bilateral 1995   COLONOSCOPY  2011   RMR: left-sided diverticula, minimal  rectal friability. No polyps. Repeat 5 years.   COLONOSCOPY WITH PROPOFOL N/A 01/15/2016   Dr.Rourk- diverticulosis in the entire examined colon, multiple rectal and colonic polyps, internal hemorrhoids. bx= tubular adenomas and hyperplastic polyps.    HAND / FINGER LESION EXCISION Right    IR THORACENTESIS ASP PLEURAL SPACE W/IMG GUIDE  03/01/2019   IR THORACENTESIS ASP PLEURAL SPACE W/IMG GUIDE  03/31/2019   POLYPECTOMY  01/15/2016   Procedure: POLYPECTOMY;  Surgeon: Daneil Dolin, MD;  Location: [facility];  Service: Endoscopy;;   right shoulder surgery     TONSILLECTOMY  1955    Current Medications: Current Meds  Medication Sig   acetaminophen (TYLENOL) 500 MG tablet Take 1,000 mg by mouth every 6 (six) hours as needed for headache.   Albuterol Sulfate 108 (90 Base) MCG/ACT AEPB Inhale 1-2 puffs into the lungs as needed.   bisacodyl (DULCOLAX) 5 MG EC tablet Take 5-15 mg by mouth daily as needed for moderate constipation.   cholecalciferol (VITAMIN D) 1000 UNITS tablet Take 2,000 Units by mouth daily.   clotrimazole (LOTRIMIN) 1 % cream Apply 1 application topically 2 (two) times daily.   fluticasone (FLONASE) 50 MCG/ACT nasal spray Place 2 sprays into both nostrils daily as needed.   furosemide (  LASIX) 20 MG tablet Take 40 mg by mouth daily.   furosemide (LASIX) 20 MG tablet Take 20 mg by mouth as needed. Take 1 Tablet as needed for swelling and weight gain 3 pounds overnight and 5 pounds in a week.   Ginkgo Biloba 40 MG TABS Take 40 mg by mouth 2 (two) times daily.   levothyroxine (SYNTHROID, LEVOTHROID) 25 MCG tablet Take 25 mcg by mouth daily before breakfast.   linaclotide (LINZESS) 72 MCG capsule Take 1 capsule (72 mcg total) by mouth daily before breakfast. (Patient taking differently: Take 72 mcg by mouth daily before breakfast. As needed)   metoprolol succinate (TOPROL-XL) 50 MG 24 hr tablet Take 25 mg by mouth daily. Take with or immediately following a meal. Takes 1/2 tablet.    OLANZapine (ZYPREXA) 15 MG tablet Take 1 tablet (15 mg total) by mouth at bedtime.   pantoprazole (PROTONIX) 40 MG tablet TAKE ONE TABLET BY MOUTH ONCE DAILY   rivaroxaban (XARELTO) 20 MG TABS tablet Take 20 mg by mouth daily after supper.    rivaroxaban (XARELTO) 20 MG TABS tablet Take 1 tablet (20 mg total) by mouth daily with supper.   SUPER B COMPLEX/C PO Take by mouth daily.   traMADol-acetaminophen (ULTRACET) 37.5-325 MG tablet Take 1-2 tablets by mouth every 4 (four) hours as needed. 1-2 tabs     Allergies:   Amlodipine and Vicodin [hydrocodone-acetaminophen]   Social History   Socioeconomic History   Marital status: Married    Spouse name: Not on file   Number of children: Not on file   Years of education: Not on file   Highest education level: Not on file  Occupational History   Not on file  Tobacco Use   Smoking status: Never   Smokeless tobacco: Never  Substance and Sexual Activity   Alcohol use: No    Alcohol/week: 0.0 standard drinks   Drug use: No   Sexual activity: Yes    Birth control/protection: None  Other Topics Concern   Not on file  Social History Narrative   Not on file   Social Determinants of Health   Financial Resource Strain: Not on file  Food Insecurity: Not on file  Transportation Needs: Not on file  Physical Activity: Not on file  Stress: Not on file  Social Connections: Not on file     Family History: The patient's family history includes Arthritis in his brother; Cancer in his paternal grandmother; Diabetes in his mother; Heart failure in his mother; Hypertension in his father and mother; Liver disease in his brother; Stroke in his sister; Vascular Disease in his brother. There is no history of Colon cancer.  ROS:   Please see the history of present illness.     All other systems reviewed and are negative.  EKGs/Labs/Other Studies Reviewed:    The following studies were reviewed today:  Echo 2015  EKG:  EKG is  ordered today.   The ekg ordered today demonstrates atrial fibrillation with ventricular rate 76  Recent Labs: No results found for requested labs within last 8760 hours.  Recent Lipid Panel No results found for: CHOL, TRIG, HDL, CHOLHDL, VLDL, LDLCALC, LDLDIRECT   Risk Assessment/Calculations:    CHA2DS2-VASc Score = 2 This indicates a 2.2% annual risk of stroke. The patient's score is based upon: CHF History: 0 HTN History: 1 Diabetes History: 0 Stroke History: 0 Vascular Disease History: 0 Age Score: 1 Gender Score: 0       Physical Exam:  VS:  BP 140/62    Pulse 76    Ht 6' 1.5" (1.867 m)    Wt 217 lb 12.8 oz (98.8 kg)    SpO2 96%    BMI 28.35 kg/m     Wt Readings from Last 3 Encounters:  08/14/21 217 lb 12.8 oz (98.8 kg)  05/22/21 208 lb 6.4 oz (94.5 kg)  02/21/21 209 lb (94.8 kg)     GEN:  Well nourished, well developed in no acute distress HEENT: Normal NECK: No JVD; No carotid bruits LYMPHATICS: No lymphadenopathy CARDIAC: irregular rhythm, regular rate, no murmur RESPIRATORY:  Clear to auscultation without rales, wheezing or rhonchi  ABDOMEN: Soft, non-tender, non-distended MUSCULOSKELETAL:  No edema; No deformity  SKIN: Warm and dry NEUROLOGIC:  Alert and oriented x 3 PSYCHIATRIC:  Normal affect   ASSESSMENT:    1. Permanent atrial fibrillation   2. Long term current use of anticoagulant   3. OSA (obstructive sleep apnea)   4. Preoperative clearance    PLAN:    In order of problems listed above:  Permanent Afib Chronic anticoagulation Continue xarelto, no bleeding problems. Continue BB. Asymptomatic   OSA not on CPAP Evaluated twice before and was unable to tolerate CPAP.    Preoperative risk evaluation for MACE He does not have a history of ischemic heart disease. He can complete 4.0 METS without angina. He is at acceptable risk to proceed with colonoscopy without further cardiac testing.  Per office protocol, patient can hold Xarelto for 2 days prior to  procedure.  I will fax to Dr. Gala Romney [phone]   Medication Adjustments/Labs and Tests Ordered: Current medicines are reviewed at length with the patient today.  Concerns regarding medicines are outlined above.  Orders Placed This Encounter  Procedures   EKG 12-Lead   Meds ordered this encounter  Medications   rivaroxaban (XARELTO) 20 MG TABS tablet    Sig: Take 1 tablet (20 mg total) by mouth daily with supper.    Dispense:  21 tablet    Refill:  0    Patient Instructions  Medication Instructions:  Lasix 20 mg ( Take 1 Tablet as needed for swelling and weight gain 3 pounds overnight and 5 pounds in a week.). *If you need a refill on your cardiac medications before your next appointment, please call your pharmacy*   Lab Work: No Labs If you have labs (blood work) drawn today and your tests are completely normal, you will receive your results only by: [place] (if you have MyChart) OR A paper copy in the mail If you have any lab test that is abnormal or we need to change your treatment, we will call you to review the results.   Testing/Procedures: No Testing   Follow-Up: At [facility], you and your health needs are our priority.  As part of our continuing mission to provide you with exceptional heart care, we have created designated Provider Care Teams.  These Care Teams include your primary Cardiologist (physician) and Advanced Practice Providers (APPs -  Physician Assistants and Nurse Practitioners) who all work together to provide you with the care you need, when you need it.  We recommend signing up for the patient portal called "MyChart".  Sign up information is provided on this After Visit Summary.  MyChart is used to connect with patients for Virtual Visits (Telemedicine).  Patients are able to view lab/test results, encounter notes, upcoming appointments, etc.  Non-urgent messages can be sent to your provider as well.  To learn more about what you can do  with MyChart, go to [email or web address].    Your next appointment:   1 year(s)  The format for your next appointment:   In Person  Provider:   Sanda Klein, MD         Signed, [place], [place]  08/14/2021 4:35 PM    [facility]

## 2021-08-14 ENCOUNTER — Encounter: Payer: Self-pay | Admitting: Physician Assistant

## 2021-08-14 ENCOUNTER — Ambulatory Visit: Payer: Medicare Other | Admitting: Physician Assistant

## 2021-08-14 ENCOUNTER — Other Ambulatory Visit: Payer: Self-pay

## 2021-08-14 VITALS — BP 140/62 | HR 76 | Ht 73.5 in | Wt 217.8 lb

## 2021-08-14 DIAGNOSIS — Z01810 Encounter for preprocedural cardiovascular examination: Secondary | ICD-10-CM | POA: Diagnosis not present

## 2021-08-14 DIAGNOSIS — G4733 Obstructive sleep apnea (adult) (pediatric): Secondary | ICD-10-CM

## 2021-08-14 DIAGNOSIS — Z7901 Long term (current) use of anticoagulants: Secondary | ICD-10-CM

## 2021-08-14 DIAGNOSIS — I4821 Permanent atrial fibrillation: Secondary | ICD-10-CM

## 2021-08-14 DIAGNOSIS — Z01818 Encounter for other preprocedural examination: Secondary | ICD-10-CM

## 2021-08-14 MED ORDER — RIVAROXABAN 20 MG PO TABS: 20.0000 mg | ORAL_TABLET | Freq: Every day | ORAL | 0 refills | Status: DC

## 2021-08-14 NOTE — Patient Instructions (Signed)
Medication Instructions:  Lasix 20 mg ( Take 1 Tablet as needed for swelling and weight gain 3 pounds overnight and 5 pounds in a week.). *If you need a refill on your cardiac medications before your next appointment, please call your pharmacy*   Lab Work: No Labs If you have labs (blood work) drawn today and your tests are completely normal, you will receive your results only by: [place] (if you have MyChart) OR A paper copy in the mail If you have any lab test that is abnormal or we need to change your treatment, we will call you to review the results.   Testing/Procedures: No Testing   Follow-Up: At [facility], you and your health needs are our priority.  As part of our continuing mission to provide you with exceptional heart care, we have created designated Provider Care Teams.  These Care Teams include your primary Cardiologist (physician) and Advanced Practice Providers (APPs -  Physician Assistants and Nurse Practitioners) who all work together to provide you with the care you need, when you need it.  We recommend signing up for the patient portal called "MyChart".  Sign up information is provided on this After Visit Summary.  MyChart is used to connect with patients for Virtual Visits (Telemedicine).  Patients are able to view lab/test results, encounter notes, upcoming appointments, etc.  Non-urgent messages can be sent to your provider as well.   To learn more about what you can do with MyChart, go to [email or web address].    Your next appointment:   1 year(s)  The format for your next appointment:   In Person  Provider:   Sanda Klein, MD

## 2021-08-15 NOTE — Telephone Encounter (Signed)
Documentation from [facility] regarding the pt's medication clearance will be placed on your desk.

## 2021-08-23 NOTE — Telephone Encounter (Signed)
Reviewed cardiology notes from 08/14/21 providing cardiac clearance and ok to hold xarelto 48 hours.   Since it has been 3 months since we last saw him, we should consider updating his colonoscopy. Can we try to get him back in in the next 2 weeks or so.

## 2021-08-23 NOTE — Telephone Encounter (Signed)
Noted  

## 2021-08-23 NOTE — Telephone Encounter (Signed)
FYI:  Noted   Routing to [place] to schedule to return in the next 2 weeks.  Stacey: per Magda Paganini can we get the pt back in here within the next 2 weeks or so?

## 2021-09-06 ENCOUNTER — Ambulatory Visit (INDEPENDENT_AMBULATORY_CARE_PROVIDER_SITE_OTHER): Payer: Medicare Other | Admitting: Psychiatry

## 2021-09-06 ENCOUNTER — Encounter: Payer: Self-pay | Admitting: Psychiatry

## 2021-09-06 ENCOUNTER — Other Ambulatory Visit: Payer: Self-pay

## 2021-09-06 DIAGNOSIS — G2581 Restless legs syndrome: Secondary | ICD-10-CM

## 2021-09-06 DIAGNOSIS — G251 Drug-induced tremor: Secondary | ICD-10-CM

## 2021-09-06 DIAGNOSIS — F3110 Bipolar disorder, current episode manic without psychotic features, unspecified: Secondary | ICD-10-CM

## 2021-09-06 DIAGNOSIS — F5105 Insomnia due to other mental disorder: Secondary | ICD-10-CM

## 2021-09-06 DIAGNOSIS — F411 Generalized anxiety disorder: Secondary | ICD-10-CM

## 2021-09-06 MED ORDER — OLANZAPINE 15 MG PO TABS: 15.0000 mg | ORAL_TABLET | Freq: Every day | ORAL | 1 refills | Status: DC

## 2021-09-06 NOTE — Progress Notes (Signed)
Casey [name] [id number] [birth year] 76 y.o.    Subjective:   Patient ID:  Casey [name] is a 76 y.o. (DOB 1946-07-05) male.  Chief Complaint:  Chief Complaint  Patient presents with   Follow-up    Bipolar I disorder, most recent episode (or current) manic ([place])   Depression   Medication Problem    HPI seen with wife Kallum Jorgensen presents for follow-up of bipolar disorder and anxiety and history of confusion  At visit  Dec 02, 2018.  He was having confusion and benztropine was stopped to see if that was the cause.  Also it was having balance issues and therefore Trileptal was switched all to nighttime 1200 mg nightly. Confusion much better per both of them with DC benztropine.  Balance better with change Trileptal.   visit was January 13, 2019 and he was doing well as noted above and he was encouraged not to make further med changes at that time.  visit April 13, 2019.  He was doing better with regard to depression than he had in quite some time on the Wellbutrin and no meds were changed.  He was having health problems with pulmonary effusion and fluid removed twice related to fall in January 2020.  seen September 13, 2019.  For residual depression the following decisions were made: Balance problem much improved with dosing Trileptal all in the evening. Depression under partial control & wife wants it to be better per his wife.  No mood swings.   Increase bupropion XL to 2 of the 150 mg tablets in the morning until the current bottle is gone. Then new RX will be 1 of the 300 mg tablets each morning.  November 08, 2019 appointment the following is noted:  Seen with wife, [name]. Small amount of improvement with change.  Anxiety got worse.  Notices when he goes out.  Worries about social and political things.  Wife keeps him active at home.  Allerigies bother him..  No sig caffeine except tea when goes out.  Wife sees him as a little more active and initiative.  More productive.   Started some cooking.  Getting out and up better.  A little more talkative.   "He wants to be hyper but we know what happens". Wife [name] thinks   But for the last 2 years he's done nothing and laid around all the time.  He's a lot more active now. Big improvement. Went to church first time in 2 years. Plan:  For TRD, Pramipexole 0.25 mg tablet 1/2 twice daily for 1 week then 1 twice daily.  12/20/2019 appointment with the following noted:  Seen with wife. A little better motivated to get out but not to get in crowds.  Wife agrees more motivated and went to church once alone without pressure.  Wife says he's more talkative and much  Better interest.  Still not motivated to do things like mow the grass.  Others's have seen a difference. Concentration and memory are improved but not normal. No Se pramipexole.  Getting out of bed earlier and better now.. Wife admisters meds bc earlier he messsed them up. Plan: Wife has seen a big improvement therefore Partial improvement is clear from this so increase Pramipexole 0.375 mg tablet  twice daily  Anxiety is not fully managed.  Partly related to depression. Tremor is manageable.    02/15/20 appt with the following noted:  Send with wife [name] Doing good.  Increased pramipexole as indicated.  Tolerating  meds Wellbutrin XL 300 mg AM & Trileptal 1200 mg HS with it. Much better with depression.  Stays up more and speinding less time in bed.   Wife agrees he's much better and handling things better. 2 1/2 yo 4th cousin girl drowned in pond lately.  Big family. Wife pleased he handled it better. SE constipation ? Related. History Linzess but diarrhea. He's not highly motivated but it is better.  Depression is much improved.  Had been depressed for years. Plan no med changes  06/13/2020 appointment with the following noted: Pretty good but cardiologist noted today increased pulse needing treatment.  Uncontrolled afib even after ablation. Hard to tell about  depression bc of fatigue Dt heart problems. Wife said he did well Thanksgiving and participated more in conversation per wife. Plan: no med changes  09/06/2020 appointment with following noted: Remains on Wellbutrin XL 300 mg every morning, ginkgo biloba 40 mg twice daily, oxcarbazepine 1200 mg nightly, pramipexole 0.75 mg twice daily. Less sig health concerns. He thinks mood has been pretty good.  Consistent with meds. Wife thinks he stays tired and sleepy and wonders if can give him a stimulant.   Sleep good.  Will nap.  Doesn't bother him.  Walks dog 20 min in AM.  Not much initiative per wife.  Not depressed but when depressed he stayed in the bed lasting a couple of years.  History of OSA but lost weight and doesn't think he has apnea anymore.  No CPAP. Chronic afib. Not much caffeine. Plan: Wife& patient has seen a big improvement with Pramipexole 0.375 mg tablet  twice daily They ask consideration be given to the following: If his energy gets better by treating his tachycardia they would like to consider reducing the pramipexole or possibly eliminating it at follow-up.   For energy trial reduction pramipexole to 0.25 mg BID.  If gets more depressed call. Alternative of modafinil 100  12/04/2020 appointment with the following noted: Energy is better with awakening earlier at 28 and doing more and getting out more.  Going to bed later with 8 hour  Sleep. Mood fluctuates with more reacting to wife mainly.  More outspoken.  Per W he has to vent.  She thinks he is too focused on politics and issues.   Plan: Depression under control.  No mood swings.   Continue bupropion XL 300 mg tablets each morning. Continue Trileptal 600 mg 2 at HS. No higher DT anxiety risk and no traditional stimulants DT afib.  Wife & patient has seen a big improvement with Pramipexole and able to reduce to  0.25 mg tablet  twice daily  02/20/2021 appointment with the following noted:  seen with wife Prednisone made  him real hyper.  Been off of it for at least a couple weeks.  Hyperverbal and hyperactive.  Hip pain interferes with sleep.  Sees doctor tomorrow. Average 3-4 hours at night and no naps bc busy.  No excessive spending but more than usual.  Denies appetite disturbance.  Patient reports that energy and motivation have been reduced.  Patient has difficulty with concentration and memory.  Patient denies any suicidal ideation.  W had MI in June. A/P: mild mania Wean Wellbutrin over 2 weeks. Continue Trileptal 600 mg 2 at HS. Wife & patient has seen a big improvement with Pramipexole and able to reduce to  0.25 mg tablet  twice daily. Continue it unless mania does not resolve with less bupropion.  03/26/2021 phone call: [name] stated Deionte has  slightly improved since last week.He has not been able to sleep and was up at 2 am texting people in a disoriented state.His pcp put him on Ambien 10 mg and this has helped him sleep.However,linda stated he is still working him self too much.She said he is jittery and has to have something to do at all times.He does not want to slow down or get rest.She thinks he needs to cutback on the Mirapex as well. MD response:  I agree with his wife about reducing Mirapex from 1 tablet twice a day to half tablet twice a day for 1 week and if his symptoms are not resolved and stop the Mirapex.  04/18/2021 phone call with wife and MD: Patient is still manic after stopping Wellbutrin and pramipexole.  Wife's concern is hard to get him to settle down at night.  Reviewed with wife prior psychiatric medications which are multiple.  Typically one would use an antipsychotic in this case but she does not think that they worked in the past.  She says he has been on higher doses of Trileptal and tolerated it. Increase Trileptal to 600 mg every morning and 1200 mg nightly for mania.  Keep scheduled appointment.  05/02/2021 wife reports patient still manic: We cannot go higher in the Trileptal  because of balance issues.  Therefore olanzapine 10 mg nightly was started and we discussed the option of hospitalization.  We will try to get the patient in urgently to an appointment.  05/07/2021 appointment with the following noted: seen with wife, [name] Not doing too well.  Trouble getting out of bed.  Wife says he's still up in middle of the night but he says he's sleeping better.  Sleep in recliner downstairs. She says he's still manic with hyperactive, hyper graphia but won't show her what he's writing. On olanzapine  No changes off Wellbutrin and pramipexole. Apparently more balance problems with increase Trileptal to 1800 mg daily.  Dropped back to 1200 mg HS. Admits to some confusion.  Has pulled off labels on bottle per wife. She thinks he's averaging 4-6 hours of sleep and a little better than it was. Wife said he started cussing and he has not done that in the past. Other episodes of inappropriate decision making.  He recognizes he's manic but can't control himself.  Hyperverbal, pressured.  In past had speech problems with mania also.  Neg neuro work up for stroke in the past. SE balance and tremor. Some trouble getting out of chair? Plan: For mania,Increase olanzapine to 15 mg PM Follow-up Friday for urgent visit  05/11/2021 appointment with the following noted: Calming down a little. He thinks he's sleeping better.  She says he needs whole Ambien with olanzapine and last night still not sleeping.  He woke her with complaints of back pain. She's not seeing SE and nor is he.  She's not seeing much change  He says back pain is complicating his sleep and his problem. He tries to stay active.   She says he talks incessantly but not as much to her. She says major life events have triggered mania in him in the past. She notices him making poor decisions. Plan: Increase olanzapine to 20 mg PM  05/24/2021 appointment with the following noted: with D Nevin Bloodgood A week ago went of to  doctor's office alone and wife called bc he was agitated. Per wife still going wide open but is sleeping better. Yesterday had fever and talking out of his head and dx  UTI. He still feels driven to do chores but distractible and inefficient.   D does not see improvement and nor does wife.   7 hour sleep lately. Plan: Increase olanzapine to 30 mg in evening Reduce oxcarbazepine to 1 and 1/2 tablets in the evening.  06/04/2021 appointment: Seen with wife Sleeping more 7-9 hours.   Per wife still some confusion and still short tempered.  Less pressured sleep. He won't go to bed when sleepy and waits too long to get in bed and is weak and cannot do it by himself. Wife agrees he's less "wide open" than before and will be able to sit and watch TV show now. B died yesterday. Plan: Continue olanzapine to 30 mg in evening Reduce oxcarbazepine  again to 1 tablets in the evening for 1 week, then 1/2 tablet at night for a week then stop it.  07/04/21 appt noted:  seen with wife [name] Hyper-ness is better.  Some trouble with concentration but able to make peanut butter fudge. Problems with afib.   Sleeping a lot and some instability.  Goes to sleep in chair.  Better with irritability.  No longer talking constantly but still some in spells.   Not depressed.   No change in balance off oxcarbazepine. Wife said last week mixed up meds and got oversedated.  She discovered the option. Plan: Reduce olanzapine to 20 mg PM now that mania is largely resolved.  08/06/2021 appointment with the following noted: Only psych med is reduced olanzapine 20 mg HS RLS and fidgety.  All day.  Can sit for an hour.  Uncomfortable when sitting. Stopped Ginko and wife noticed RLS more.  She restarted it.  No longer rocking back and forth. Caffeine tea at lunch.  No coffee..  [place] some. RLS recently started. Bipolar sx great per wife.  Not wanting to go out much. CO tiredness.  Falls asleep in recliner.  Sleep all  night. Eats sweets all his life. Plan: No longer manic. Reduce olanzapine to 15 mg earlier in PM Stop caffeine  09/06/2021 appointment with the following noted: Reduced olanzapine 15 mg and still RLS all the day. No change in that.  But can sit in church ok.  Will move legs when standing and talking to people. Sleeping more than he should from 10 until 9. And still naps and little initiative bc tired. Not depressed but not back to normal.  Not as social as usual. Loves to eat out.  Past Psychiatric Medication Trials: Seroquel 300,  olanzapine 30 resolved mania,  (Improved  with Increase olanzapine to 30 mg PM and off the oxcarbazepine.) perphenazine, risperidone,  Latuda,  Abilify, Lithium did great helped but SE afib, kidneys and prostate,    alprazolam,  Depakote, Trileptal, carbamazepine, lamotrigine caused nausea, citalopram, sertraline 150,   Wellbutrin 300 anxiety,  Pramipexole 0.375 BID Benztropine confusion        1 prior psych hosp depression                                                    Review of Systems:  Review of Systems  Constitutional:  Positive for fatigue.  Eyes:  Negative for visual disturbance.  Gastrointestinal:  Positive for constipation.  Musculoskeletal:  Positive for arthralgias, back pain and gait problem.  Skin:  Negative for rash.  chronic  Neurological:  Positive for tremors and weakness.       Balance px longterm no worse RLS day and PM  Psychiatric/Behavioral:  Negative for agitation, behavioral problems, confusion, decreased concentration, dysphoric mood, hallucinations, self-injury, sleep disturbance and suicidal ideas. The patient is nervous/anxious. The patient is not hyperactive.    Medications: I have reviewed the patient's current medications.  Current Outpatient Medications  Medication Sig Dispense Refill   acetaminophen (TYLENOL) 500 MG tablet Take 1,000 mg by mouth every 6 (six) hours as needed for headache.     Albuterol  Sulfate 108 (90 Base) MCG/ACT AEPB Inhale 1-2 puffs into the lungs as needed.     bisacodyl (DULCOLAX) 5 MG EC tablet Take 5-15 mg by mouth daily as needed for moderate constipation.     cholecalciferol (VITAMIN D) 1000 UNITS tablet Take 2,000 Units by mouth daily.     clotrimazole (LOTRIMIN) 1 % cream Apply 1 application topically 2 (two) times daily.     fluticasone (FLONASE) 50 MCG/ACT nasal spray Place 2 sprays into both nostrils daily as needed.  11   furosemide (LASIX) 20 MG tablet Take 20 mg by mouth as needed. Take 1 Tablet as needed for swelling and weight gain 3 pounds overnight and 5 pounds in a week.     levothyroxine (SYNTHROID, LEVOTHROID) 25 MCG tablet Take 25 mcg by mouth daily before breakfast.     linaclotide (LINZESS) 72 MCG capsule Take 1 capsule (72 mcg total) by mouth daily before breakfast. (Patient taking differently: Take 72 mcg by mouth daily before breakfast. As needed) 30 capsule 2   metoprolol succinate (TOPROL-XL) 50 MG 24 hr tablet Take 25 mg by mouth daily. Take with or immediately following a meal. Takes 1/2 tablet.     pantoprazole (PROTONIX) 40 MG tablet TAKE ONE TABLET BY MOUTH ONCE DAILY 30 tablet 11   rivaroxaban (XARELTO) 20 MG TABS tablet Take 20 mg by mouth daily after supper.      SUPER B COMPLEX/C PO Take by mouth daily.     traMADol-acetaminophen (ULTRACET) 37.5-325 MG tablet Take 1-2 tablets by mouth every 4 (four) hours as needed. 1-2 tabs     Ginkgo Biloba 40 MG TABS Take 40 mg by mouth 2 (two) times daily. (Patient not taking: Reported on 09/06/2021)     OLANZapine (ZYPREXA) 15 MG tablet Take 1 tablet (15 mg total) by mouth at bedtime. 30 tablet 1   No current facility-administered medications for this visit.    Medication Side Effects: resolved  Allergies:  Allergies  Allergen Reactions   Amlodipine Other (See Comments)    Stopped due to kidney issues   Vicodin [Hydrocodone-Acetaminophen] Other (See Comments)    Patient is unsure of reaction     Past Medical History:  Diagnosis Date   Arthritis    Atrial fibrillation ([place]) 01/12/2010   2D Echo EF=>55%   Bipolar disorder ([facility])    Chronic back pain    Depression    Dysrhythmia    AFib   History of colonic polyps    History of gout    Hyperlipidemia    Hypertension    Hypothyroidism    Morbid obesity ([place])    OSA (obstructive sleep apnea)    AHI was 27.62hr RDI was 34.3 hr REM 0.00hr; had CPAP years ago but no longer uses.   Prostatitis    Tubular adenoma of colon 01/2016    Family History  Problem Relation Age of Onset   Diabetes Mother  Heart failure Mother    Hypertension Mother    Hypertension Father    Cancer Paternal Grandmother    Stroke Sister    Liver disease Brother    Vascular Disease Brother    Arthritis Brother    Colon cancer Neg Hx     Social History   Socioeconomic History   Marital status: Married    Spouse name: Not on file   Number of children: Not on file   Years of education: Not on file   Highest education level: Not on file  Occupational History   Not on file  Tobacco Use   Smoking status: Never   Smokeless tobacco: Never  Substance and Sexual Activity   Alcohol use: No    Alcohol/week: 0.0 standard drinks   Drug use: No   Sexual activity: Yes    Birth control/protection: None  Other Topics Concern   Not on file  Social History Narrative   Not on file   Social Determinants of Health   Financial Resource Strain: Not on file  Food Insecurity: Not on file  Transportation Needs: Not on file  Physical Activity: Not on file  Stress: Not on file  Social Connections: Not on file  Intimate Partner Violence: Not on file    Past Medical History, Surgical history, Social history, and Family history were reviewed and updated as appropriate.   Please see review of systems for further details on the patient's review from today.   Objective:   Physical Exam:  There were no vitals taken for this visit.  Physical  Exam Constitutional:      General: He is not in acute distress. Musculoskeletal:        General: No deformity.  Neurological:     Mental Status: He is alert and oriented to person, place, and time.     Coordination: Coordination abnormal.     Gait: Gait abnormal.     Comments: Mild rocking motions legs only  Psychiatric:        Attention and Perception: He is attentive. He does not perceive auditory hallucinations.        Mood and Affect: Mood is not anxious or depressed. Affect is not labile, blunt, angry or inappropriate.        Speech: Speech is not rapid and pressured or tangential.        Behavior: Behavior normal.        Thought Content: Thought content normal. Thought content is not delusional. Thought content does not include homicidal or suicidal ideation. Thought content does not include suicidal plan.        Cognition and Memory: Cognition normal.        Judgment: Judgment normal.     Comments: Insight intact. No auditory or visual hallucinations. No delusions.  Mania resolved No Pressured speech. Emotional lability not present in office    Lab Review:     Component Value Date/Time   NA 141 12/28/2018 0941   K 4.0 12/28/2018 0941   CL 104 12/28/2018 0941   CO2 22 12/28/2018 0941   GLUCOSE 88 12/28/2018 0941   GLUCOSE 111 (H) 06/30/2018 1311   BUN 16 12/28/2018 0941   CREATININE 1.30 (H) 12/28/2018 0941   CALCIUM 9.4 12/28/2018 0941   PROT 6.6 12/28/2018 0941   ALBUMIN 4.5 12/28/2018 0941   AST 21 12/28/2018 0941   ALT 22 12/28/2018 0941   ALKPHOS 115 12/28/2018 0941   BILITOT 0.7 12/28/2018 0941   GFRNONAA 55 (L) 12/28/2018  0941   GFRAA 63 12/28/2018 0941       Component Value Date/Time   WBC 8.1 12/28/2018 0941   WBC 5.4 06/30/2018 1311   RBC 4.81 12/28/2018 0941   RBC 4.52 06/30/2018 1311   HGB 15.2 12/28/2018 0941   HCT 42.9 12/28/2018 0941   PLT 186 12/28/2018 0941   MCV 89 12/28/2018 0941   MCH 31.6 12/28/2018 0941   MCH 31.9 06/30/2018 1311    MCHC 35.4 12/28/2018 0941   MCHC 32.8 06/30/2018 1311   RDW 12.9 12/28/2018 0941   LYMPHSABS 1.1 12/28/2018 0941   MONOABS 0.5 06/30/2018 1311   EOSABS 0.1 12/28/2018 0941   BASOSABS 0.0 12/28/2018 0941    No results found for: POCLITH, LITHIUM   No results found for: PHENYTOIN, PHENOBARB, VALPROATE, CBMZ   .res Assessment: Plan:    Jarone was seen today for follow-up, depression and medication problem.  Diagnoses and all orders for this visit:  Bipolar I disorder, most recent episode (or current) manic ([facility]) -     OLANZapine (ZYPREXA) 15 MG tablet; Take 1 tablet (15 mg total) by mouth at bedtime.  Secondary restless legs syndrome  Generalized anxiety disorder  Tremor due to multiple drugs  Insomnia due to mental condition   History pseudodementia with depression which is resolved.  Dx bipolar at 76 yo.  Bipolar depression has flipped into mania from prednisone.  Patient is very complicated because of weakness and medical problems.   Off walker and using cane Disc this at length and risk of crashing into depression if we can't ease him down.   Pt is no longer manic and is not depressed..    Prednisone triggered some mania in recently  and persisted off prednisone.    RLS ongoing.  Stop caffeine.  Disc dx restlessness. Drowsy and tired.  No longer manic. Disc dosing range. Reduce olanzapine from 15 to 7.5 mg earlier in PM  Stop caffeine  Improved  with Increase olanzapine to 30 mg PM and off the oxcarbazepine.  Discussed side effect of each medication Fall precautions.  Follow-up 4 weeks  Lynder Parents, MD, DFAPA   Please see After Visit Summary for patient specific instructions.  Future Appointments  Date Time Provider [place]  09/10/2021  3:00 PM Westly Pam [facility]  10/02/2021 11:00 AM Cottle, Billey Co., MD CP-CP None    No orders of the defined types were placed in this encounter.      -------------------------------

## 2021-09-09 NOTE — Progress Notes (Signed)
Primary Care Physician:  Sharilyn Sites, MD  Primary Gastroenterologist:  Garfield Cornea, MD   Chief Complaint  Patient presents with   Colonoscopy    HPI:  Casey [name] is a 77 y.o. male here for follow up and to schedule colonoscopy for h/o adenomatous colon polyps. He also has GERD and constipation. Last seen in 05/2021. H/o GERD, constipation, colon polyps. Due for colonoscopy with last one in 2017. Patient with remote polypectomy bleed requiring transfusion and hospitalization in 2002.   Patients procedure was postponed after last ov until we received cardiac clearance. He was having significant piting edema to his knees at time of last ov with cardiology evaluation pending. He is also on Xarelto.  He was seen by cardiology on January 31.  He was felt to be an acceptable risk to proceed with colonoscopy without further cardiac testing.  Cardiology approved holding Xarelto for 2 days prior to procedure.  Patient presents today stating that he was on a medication that was causing the swelling.  His swelling has resolved.  He was taken off of several medications and now his constipation has resolved as well.  Does not require Linzess anymore.  No melena or rectal bleeding.  Denies any abdominal pain.  Acid reflux is well controlled.  No dysphagia, vomiting.  No prior upper endoscopy.   Current Outpatient Medications  Medication Sig Dispense Refill   acetaminophen (TYLENOL) 500 MG tablet Take 1,000 mg by mouth every 6 (six) hours as needed for headache.     Albuterol Sulfate 108 (90 Base) MCG/ACT AEPB Inhale 1-2 puffs into the lungs as needed.     bisacodyl (DULCOLAX) 5 MG EC tablet Take 5-15 mg by mouth daily as needed for moderate constipation.     cholecalciferol (VITAMIN D) 1000 UNITS tablet Take 2,000 Units by mouth daily.     fluticasone (FLONASE) 50 MCG/ACT nasal spray Place 2 sprays into both nostrils daily as needed.  11   furosemide (LASIX) 20 MG tablet Take 20 mg by mouth as  needed. Take 1 Tablet as needed for swelling and weight gain 3 pounds overnight and 5 pounds in a week.     levothyroxine (SYNTHROID, LEVOTHROID) 25 MCG tablet Take 25 mcg by mouth daily before breakfast.     metoprolol succinate (TOPROL-XL) 50 MG 24 hr tablet Take 25 mg by mouth daily. Take with or immediately following a meal. Takes 1/2 tablet.     OLANZapine (ZYPREXA) 15 MG tablet Take 1 tablet (15 mg total) by mouth at bedtime. 30 tablet 1   pantoprazole (PROTONIX) 40 MG tablet TAKE ONE TABLET BY MOUTH ONCE DAILY 30 tablet 11   rivaroxaban (XARELTO) 20 MG TABS tablet Take 20 mg by mouth daily after supper.      SUPER B COMPLEX/C PO Take by mouth daily.     traMADol-acetaminophen (ULTRACET) 37.5-325 MG tablet Take 1-2 tablets by mouth every 4 (four) hours as needed. 1-2 tabs     No current facility-administered medications for this visit.    Allergies as of 09/10/2021 - Review Complete 09/10/2021  Allergen Reaction Noted   Amlodipine Other (See Comments) 11/02/2012   Vicodin [hydrocodone-acetaminophen] Other (See Comments) 01/15/2016    Past Medical History:  Diagnosis Date   Arthritis    Atrial fibrillation ([place]) 01/12/2010   2D Echo EF=>55%   Bipolar disorder ([facility])    Chronic back pain    Depression    Dysrhythmia    AFib   History of colonic polyps  History of gout    Hyperlipidemia    Hypertension    Hypothyroidism    Morbid obesity ([place])    OSA (obstructive sleep apnea)    AHI was 27.62hr RDI was 34.3 hr REM 0.00hr; had CPAP years ago but no longer uses.   Prostatitis    Tubular adenoma of colon 01/2016    Past Surgical History:  Procedure Laterality Date   APPENDECTOMY  1959   asthma     CATARACT EXTRACTION Bilateral 1995   COLONOSCOPY  2011   RMR: left-sided diverticula, minimal rectal friability. No polyps. Repeat 5 years.   COLONOSCOPY WITH PROPOFOL N/A 01/15/2016   Dr.Rourk- diverticulosis in the entire examined colon, multiple rectal and colonic polyps,  internal hemorrhoids. bx= tubular adenomas and hyperplastic polyps.    HAND / FINGER LESION EXCISION Right    IR THORACENTESIS ASP PLEURAL SPACE W/IMG GUIDE  03/01/2019   IR THORACENTESIS ASP PLEURAL SPACE W/IMG GUIDE  03/31/2019   POLYPECTOMY  01/15/2016   Procedure: POLYPECTOMY;  Surgeon: Daneil Dolin, MD;  Location: [facility];  Service: Endoscopy;;   right shoulder surgery     TONSILLECTOMY  1955    Family History  Problem Relation Age of Onset   Diabetes Mother    Heart failure Mother    Hypertension Mother    Hypertension Father    Cancer Paternal Grandmother    Stroke Sister    Liver disease Brother    Vascular Disease Brother    Arthritis Brother    Colon cancer Neg Hx     Social History   Socioeconomic History   Marital status: Married    Spouse name: Not on file   Number of children: Not on file   Years of education: Not on file   Highest education level: Not on file  Occupational History   Not on file  Tobacco Use   Smoking status: Never   Smokeless tobacco: Never  Substance and Sexual Activity   Alcohol use: No    Alcohol/week: 0.0 standard drinks   Drug use: No   Sexual activity: Yes    Birth control/protection: None  Other Topics Concern   Not on file  Social History Narrative   Not on file   Social Determinants of Health   Financial Resource Strain: Not on file  Food Insecurity: Not on file  Transportation Needs: Not on file  Physical Activity: Not on file  Stress: Not on file  Social Connections: Not on file  Intimate Partner Violence: Not on file      ROS:  General: Negative for anorexia, weight loss, fever, chills, fatigue, weakness. Eyes: Negative for vision changes.  ENT: Negative for hoarseness, difficulty swallowing , nasal congestion. CV: Negative for chest pain, angina, palpitations, dyspnea on exertion, peripheral edema.  Respiratory: Negative for dyspnea at rest, dyspnea on exertion, cough, sputum, wheezing.  GI: See  history of present illness. GU:  Negative for dysuria, hematuria, urinary incontinence, urinary frequency, nocturnal urination.  MS: Negative for joint pain.  Chronic low back pain.  Derm: Chronic rash improving  neuro: Negative for weakness, abnormal sensation, seizure, frequent headaches, memory loss, confusion.  Psych: Negative for anxiety, depression, suicidal ideation, hallucinations.  Endo: Negative for unusual weight change.  Heme: Negative for bruising or bleeding. Allergy: Negative for rash or hives.    Physical Examination:  BP 140/80    Pulse 96    Temp (!) 97.5 F (36.4 C)    Ht 6' (1.829 m)  Wt 222 lb 6.4 oz (100.9 kg)    SpO2 98%    BMI 30.16 kg/m    General: Well-nourished, well-developed in no acute distress.  Head: Normocephalic, atraumatic.   Eyes: Conjunctiva pink, no icterus. Mouth:masked. Neck: Supple without thyromegaly, masses, or lymphadenopathy.  Lungs: Clear to auscultation bilaterally.  Heart: Regular rate and rhythm, no murmurs rubs or gallops.  Abdomen: Bowel sounds are normal, nontender, nondistended, no hepatosplenomegaly or masses, no abdominal bruits or    hernia , no rebound or guarding.   Rectal: Not performed Extremities: No lower extremity edema. No clubbing or deformities.  Neuro: Alert and oriented x 4 , grossly normal neurologically.  Skin: Warm and dry, no rash or jaundice.   Psych: Alert and cooperative, normal mood and affect.   Assessment:  Constipation: Doing better.  Patient states that he was taken off of some medications that apparently was causing constipation.  No longer requires Linzess.  GERD: Doing well on pantoprazole.  Denies any dysphagia.  No alarm symptoms.  No prior upper endoscopy.  We discussed that he has multiple risk factors for Barrett's esophagus but he is not interested in pursuing screening EGD.  History of colonic adenomas: Due for surveillance colonoscopy.  Recent cardiac clearance approved.  Okay to hold  Xarelto for 2 days prior to procedure.   Plan: Colonoscopy with Dr. Gala Romney with propofol ASA 3.  I have discussed the risks, alternatives, benefits with regards to but not limited to the risk of reaction to medication, bleeding, infection, perforation and the patient is agreeable to proceed. Written consent to be obtained. Continue pantoprazole 40 mg daily.

## 2021-09-10 ENCOUNTER — Ambulatory Visit: Payer: Medicare Other | Admitting: Gastroenterology

## 2021-09-10 ENCOUNTER — Encounter: Payer: Self-pay | Admitting: Gastroenterology

## 2021-09-10 ENCOUNTER — Other Ambulatory Visit: Payer: Self-pay

## 2021-09-10 VITALS — BP 140/80 | HR 96 | Temp 97.5°F | Ht 72.0 in | Wt 222.4 lb

## 2021-09-10 DIAGNOSIS — Z86010 Personal history of colonic polyps: Secondary | ICD-10-CM | POA: Diagnosis not present

## 2021-09-10 DIAGNOSIS — K219 Gastro-esophageal reflux disease without esophagitis: Secondary | ICD-10-CM | POA: Diagnosis not present

## 2021-09-10 DIAGNOSIS — K5900 Constipation, unspecified: Secondary | ICD-10-CM

## 2021-09-10 NOTE — Patient Instructions (Addendum)
Colonoscopy to be scheduled in near future. See separate instructions.  Continue pantoprazole 40mg  daily for acid reflux.

## 2021-09-11 ENCOUNTER — Other Ambulatory Visit: Payer: Self-pay

## 2021-09-11 ENCOUNTER — Telehealth: Payer: Self-pay

## 2021-09-11 MED ORDER — PEG 3350-KCL-NA BICARB-NACL 420 G PO SOLR: 4000.0000 mL | ORAL | 0 refills | Status: DC

## 2021-09-11 NOTE — Telephone Encounter (Signed)
Pre-op appt 10/02/21 at 2:45pm. Appt letter mailed with procedure instructions.

## 2021-09-11 NOTE — Telephone Encounter (Signed)
Called pt to schedule TCS w/Propofol ASA 3 w/Dr. Gala Romney. He wanted me to schedule with his wife. TCS scheduled for 10/04/21 at 9:15am. Informed her  that he will need to hold Xarelto for 48 hours prior to procedure. Rx for prep sent to pharmacy. Orders entered.

## 2021-09-24 ENCOUNTER — Telehealth: Payer: Self-pay

## 2021-09-24 ENCOUNTER — Telehealth: Payer: Self-pay | Admitting: Internal Medicine

## 2021-09-24 NOTE — Telephone Encounter (Signed)
Patient is having back pain and needs to cancel his procedure  ?

## 2021-09-24 NOTE — Telephone Encounter (Signed)
Patients wife called stating that he received a shot in his back a few years ago and he would like a repeat injection. I was assuming Dr. Ernestina Patches but I do not see his name in patients chart, can you please advise for me.  ?

## 2021-09-24 NOTE — Telephone Encounter (Signed)
Spoke with pt. Reports he is having a lot of back issues and having to get shots in back in [place]. Reports to cancel procedure for now and will call back to reschedule when ready ?

## 2021-09-24 NOTE — Telephone Encounter (Signed)
Noted  

## 2021-09-25 ENCOUNTER — Other Ambulatory Visit: Payer: Self-pay | Admitting: Family Medicine

## 2021-09-25 ENCOUNTER — Other Ambulatory Visit: Payer: Self-pay

## 2021-09-25 DIAGNOSIS — G8929 Other chronic pain: Secondary | ICD-10-CM

## 2021-09-25 NOTE — Telephone Encounter (Signed)
Spoke with patient's wife. Ordered ESI at [facility]. ?

## 2021-09-25 NOTE — Telephone Encounter (Signed)
Yes

## 2021-10-01 ENCOUNTER — Other Ambulatory Visit: Payer: Self-pay | Admitting: Family Medicine

## 2021-10-02 ENCOUNTER — Other Ambulatory Visit (HOSPITAL_COMMUNITY): Payer: Medicare Other

## 2021-10-02 ENCOUNTER — Other Ambulatory Visit: Payer: Self-pay

## 2021-10-02 ENCOUNTER — Ambulatory Visit (INDEPENDENT_AMBULATORY_CARE_PROVIDER_SITE_OTHER): Payer: Medicare Other | Admitting: Psychiatry

## 2021-10-02 ENCOUNTER — Encounter: Payer: Self-pay | Admitting: Psychiatry

## 2021-10-02 DIAGNOSIS — F3110 Bipolar disorder, current episode manic without psychotic features, unspecified: Secondary | ICD-10-CM | POA: Diagnosis not present

## 2021-10-02 DIAGNOSIS — F5105 Insomnia due to other mental disorder: Secondary | ICD-10-CM

## 2021-10-02 DIAGNOSIS — G2581 Restless legs syndrome: Secondary | ICD-10-CM | POA: Diagnosis not present

## 2021-10-02 DIAGNOSIS — G251 Drug-induced tremor: Secondary | ICD-10-CM | POA: Diagnosis not present

## 2021-10-02 DIAGNOSIS — F411 Generalized anxiety disorder: Secondary | ICD-10-CM | POA: Diagnosis not present

## 2021-10-02 DIAGNOSIS — R410 Disorientation, unspecified: Secondary | ICD-10-CM

## 2021-10-02 NOTE — Progress Notes (Signed)
PAXON PROPES ?[id number] ?[birth year] ?76 y.o. ? ? ? ?Subjective:  ? ?Patient ID:  Casey [name] is a 76 y.o. (DOB [birth year]) male. ? ?Chief Complaint:  ?Chief Complaint  ?Patient presents with  ? Follow-up  ?  Bipolar I disorder, most recent episode (or current) manic ([place])  ? Medication Problem  ? ? ?HPI seen with wife [name] ?Jeralene Huff presents for follow-up of bipolar disorder and anxiety and history of confusion ? ?At visit  Dec 02, 2018.  He was having confusion and benztropine was stopped to see if that was the cause.  Also it was having balance issues and therefore Trileptal was switched all to nighttime 1200 mg nightly. ?Confusion much better per both of them with DC benztropine.  Balance better with change Trileptal.  ? ?visit was January 13, 2019 and he was doing well as noted above and he was encouraged not to make further med changes at that time. ? ?visit April 13, 2019.  He was doing better with regard to depression than he had in quite some time on the Wellbutrin and no meds were changed.  He was having health problems with pulmonary effusion and fluid removed twice related to fall in January 2020. ? ?seen September 13, 2019.  For residual depression the following decisions were made: ?Balance problem much improved with dosing Trileptal all in the evening. ?Depression under partial control & wife wants it to be better per his wife.  No mood swings.   ?Increase bupropion XL to 2 of the 150 mg tablets in the morning until the current bottle is gone. ?Then new RX will be 1 of the 300 mg tablets each morning. ? ?November 08, 2019 appointment the following is noted:  Seen with wife, [name]. ?Small amount of improvement with change.  Anxiety got worse.  Notices when he goes out.  Worries about social and political things.  Wife keeps him active at home.  Allerigies bother him.Marland Kitchen  ?No sig caffeine except tea when goes out.  ?Wife sees him as a little more active and initiative.  More productive.  Started some  cooking.  Getting out and up better.  A little more talkative.   ?"He wants to be hyper but we know what happens". ?Wife [name] thinks   But for the last 2 years he's done nothing and laid around all the time.  He's a lot more active now. Big improvement. ?Went to church first time in 2 years. ?Plan:  For TRD, Pramipexole 0.25 mg tablet 1/2 twice daily for 1 week then 1 twice daily. ? ?12/20/2019 appointment with the following noted:  Seen with wife. ?A little better motivated to get out but not to get in crowds.  Wife agrees more motivated and went to church once alone without pressure.  Wife says he's more talkative and much  Better interest.  Still not motivated to do things like mow the grass.  Others's have seen a difference. Concentration and memory are improved but not normal. ?No Se pramipexole.  Getting out of bed earlier and better now.Marland Kitchen ?Wife admisters meds bc earlier he messsed them up. ?Plan: Wife has seen a big improvement therefore Partial improvement is clear from this so increase Pramipexole 0.375 mg tablet  twice daily ? Anxiety is not fully managed.  Partly related to depression. ?Tremor is manageable.   ? ?02/15/20 appt with the following noted:  Send with wife [name] ?Doing good.  Increased pramipexole as indicated.  Tolerating meds Wellbutrin XL  300 mg AM & Trileptal 1200 mg HS with it. ?Much better with depression.  Stays up more and speinding less time in bed.   ?Wife agrees he's much better and handling things better. ?2 1/2 yo 4th cousin girl drowned in pond lately.  Big family. ?Wife pleased he handled it better. ?SE constipation ? Related. ?History Linzess but diarrhea. ?He's not highly motivated but it is better.  Depression is much improved.  Had been depressed for years. ?Plan no med changes ? ?06/13/2020 appointment with the following noted: ?Pretty good but cardiologist noted today increased pulse needing treatment.  Uncontrolled afib even after ablation. ?Hard to tell about depression bc  of fatigue Dt heart problems. ?Wife said he did well Thanksgiving and participated more in conversation per wife. ?Plan: no med changes ? ?09/06/2020 appointment with following noted: ?Remains on Wellbutrin XL 300 mg every morning, ginkgo biloba 40 mg twice daily, oxcarbazepine 1200 mg nightly, pramipexole 0.75 mg twice daily. ?Less sig health concerns. ?He thinks mood has been pretty good.  Consistent with meds. ?Wife thinks he stays tired and sleepy and wonders if can give him a stimulant.   ?Sleep good.  Will nap.  Doesn't bother him.  Walks dog 20 min in AM.  Not much initiative per wife.  Not depressed but when depressed he stayed in the bed lasting a couple of years.  ?History of OSA but lost weight and doesn't think he has apnea anymore.  No CPAP. ?Chronic afib. ?Not much caffeine. ?Plan: Wife& patient has seen a big improvement with Pramipexole 0.375 mg tablet  twice daily ?They ask consideration be given to the following: If his energy gets better by treating his tachycardia they would like to consider reducing the pramipexole or possibly eliminating it at follow-up.   ?For energy trial reduction pramipexole to 0.25 mg BID.  If gets more depressed call. ?Alternative of modafinil 100 ? ?12/04/2020 appointment with the following noted: ?Energy is better with awakening earlier at 9 and doing more and getting out more.  Going to bed later with 8 hour  Sleep. ?Mood fluctuates with more reacting to wife mainly.  More outspoken.  Per W he has to vent.  She thinks he is too focused on politics and issues.   ?Plan: Depression under control.  No mood swings.   ?Continue bupropion XL 300 mg tablets each morning. ?Continue Trileptal 600 mg 2 at HS. ?No higher DT anxiety risk and no traditional stimulants DT afib. ? Wife & patient has seen a big improvement with Pramipexole and able to reduce to  0.25 mg tablet  twice daily ? ?02/20/2021 appointment with the following noted:  seen with wife ?Prednisone made him real hyper.   Been off of it for at least a couple weeks.  Hyperverbal and hyperactive.  Hip pain interferes with sleep.  Sees doctor tomorrow. Average 3-4 hours at night and no naps bc busy.  No excessive spending but more than usual.  Denies appetite disturbance.  Patient reports that energy and motivation have been reduced.  Patient has difficulty with concentration and memory.  Patient denies any suicidal ideation.  ?W had MI in June. ?A/P: mild mania ?Wean Wellbutrin over 2 weeks. ?Continue Trileptal 600 mg 2 at HS. ?Wife & patient has seen a big improvement with Pramipexole and able to reduce to  0.25 mg tablet  twice daily. Continue it unless mania does not resolve with less bupropion. ? ?03/26/2021 phone call: [name] stated [place] has slightly improved since  last week.He has not been able to sleep and was up at 2 am texting people in a disoriented state.His pcp put him on Ambien 10 mg and this has helped him sleep.However,linda stated he is still working him self too much.She said he is jittery and has to have something to do at all times.He does not want to slow down or get rest.She thinks he needs to cutback on the Mirapex as well. ?MD response:  I agree with his wife about reducing Mirapex from 1 tablet twice a day to half tablet twice a day for 1 week and if his symptoms are not resolved and stop the Mirapex. ? ?04/18/2021 phone call with wife and MD: ?Patient is still manic after stopping Wellbutrin and pramipexole.  Wife's concern is hard to get him to settle down at night. ? Reviewed with wife prior psychiatric medications which are multiple.  Typically one would use an antipsychotic in this case but she does not think that they worked in the past.  She says he has been on higher doses of Trileptal and tolerated it. ?Increase Trileptal to 600 mg every morning and 1200 mg nightly for mania.  Keep scheduled appointment. ? ?05/02/2021 wife reports patient still manic: We cannot go higher in the Trileptal because of balance  issues.  Therefore olanzapine 10 mg nightly was started and we discussed the option of hospitalization.  We will try to get the patient in urgently to an appointment. ? ?05/07/2021 appointment with the

## 2021-10-03 ENCOUNTER — Ambulatory Visit
Admission: RE | Admit: 2021-10-03 | Discharge: 2021-10-03 | Disposition: A | Discharge status: Home / Self Care | Payer: Medicare Other | Source: Ambulatory Visit | Attending: Orthopaedic Surgery | Admitting: Orthopaedic Surgery

## 2021-10-03 DIAGNOSIS — M545 Low back pain, unspecified: Secondary | ICD-10-CM | POA: Diagnosis not present

## 2021-10-03 DIAGNOSIS — G8929 Other chronic pain: Secondary | ICD-10-CM

## 2021-10-03 MED ORDER — IOPAMIDOL (ISOVUE-M 200) INJECTION 41%
1.0000 mL | Freq: Once | INTRAMUSCULAR | Status: AC
  Administered 2021-10-03: 1 mL via EPIDURAL

## 2021-10-03 MED ORDER — METHYLPREDNISOLONE ACETATE 40 MG/ML INJ SUSP (RADIOLOG
80.0000 mg | Freq: Once | INTRAMUSCULAR | Status: AC
  Administered 2021-10-03: 80 mg via EPIDURAL

## 2021-10-03 NOTE — Discharge Instructions (Signed)

## 2021-10-04 ENCOUNTER — Encounter (HOSPITAL_COMMUNITY): Payer: Self-pay

## 2021-10-04 ENCOUNTER — Ambulatory Visit (HOSPITAL_COMMUNITY): Admit: 2021-10-04 | Payer: Medicare Other | Admitting: Internal Medicine

## 2021-10-04 SURGERY — COLONOSCOPY WITH PROPOFOL
Anesthesia: Monitor Anesthesia Care

## 2021-10-30 ENCOUNTER — Telehealth: Payer: Self-pay | Admitting: Orthopaedic Surgery

## 2021-10-30 ENCOUNTER — Other Ambulatory Visit: Payer: Self-pay

## 2021-10-30 DIAGNOSIS — G8929 Other chronic pain: Secondary | ICD-10-CM

## 2021-10-30 NOTE — Telephone Encounter (Signed)
Spoke with patient's wife. Entered new order for repeat injection at [facility].  ?

## 2021-10-30 NOTE — Telephone Encounter (Signed)
OK to repeat injection

## 2021-10-30 NOTE — Telephone Encounter (Signed)
Pt's wife  [name] called requesting a call back from [employer]. She is asking if pt can get another injection. Pt is still experiencing pain. Please call pt's wife at [phone]. ?

## 2021-11-06 ENCOUNTER — Ambulatory Visit
Admission: RE | Admit: 2021-11-06 | Discharge: 2021-11-06 | Disposition: A | Discharge status: Home / Self Care | Payer: Medicare Other | Source: Ambulatory Visit | Attending: Orthopaedic Surgery | Admitting: Orthopaedic Surgery

## 2021-11-06 DIAGNOSIS — M5416 Radiculopathy, lumbar region: Secondary | ICD-10-CM | POA: Diagnosis not present

## 2021-11-06 DIAGNOSIS — M545 Low back pain, unspecified: Secondary | ICD-10-CM

## 2021-11-06 MED ORDER — METHYLPREDNISOLONE ACETATE 40 MG/ML INJ SUSP (RADIOLOG
80.0000 mg | Freq: Once | INTRAMUSCULAR | Status: AC
  Administered 2021-11-06: 80 mg via EPIDURAL

## 2021-11-06 MED ORDER — IOPAMIDOL (ISOVUE-M 200) INJECTION 41%
1.0000 mL | Freq: Once | INTRAMUSCULAR | Status: AC
  Administered 2021-11-06: 1 mL via EPIDURAL

## 2021-11-06 NOTE — Discharge Instructions (Signed)
Post Procedure Spinal Discharge Instruction Sheet ? ?You may resume a regular diet and any medications that you routinely take (including pain medications) unless otherwise noted by MD. ? ?No driving day of procedure. ? ?Light activity throughout the rest of the day.  Do not do any strenuous work, exercise, bending or lifting.  The day following the procedure, you can resume normal physical activity but you should refrain from exercising or physical therapy for at least three days thereafter. ? ?You may apply ice to the injection site, 20 minutes on, 20 minutes off, as needed. Do not apply ice directly to skin.  ? ? ?Common Side Effects: ? ?Headaches- take your usual medications as directed by your physician.  Increase your fluid intake.  Caffeinated beverages may be helpful.  Lie flat in bed until your headache resolves. ? ?Restlessness or inability to sleep- you may have trouble sleeping for the next few days.  Ask your referring physician if you need any medication for sleep. ? ?Facial flushing or redness- should subside within a few days. ? ?Increased pain- a temporary increase in pain a day or two following your procedure is not unusual.  Take your pain medication as prescribed by your referring physician. ? ?Leg cramps ? ?Please contact our office at [phone] for the following symptoms: ?Fever greater than 100 degrees. ?Headaches unresolved with medication after 2-3 days. ?Increased swelling, pain, or redness at injection site. ? ? ?YOU MAY RESUME YOUR XARELTO 24 HOURS AFTER PROCEDURE ? ?Thank you for visiting [facility] today.   ?

## 2021-12-04 ENCOUNTER — Encounter: Payer: Self-pay | Admitting: Psychiatry

## 2021-12-04 ENCOUNTER — Ambulatory Visit: Payer: Medicare Other | Admitting: Psychiatry

## 2021-12-04 DIAGNOSIS — F411 Generalized anxiety disorder: Secondary | ICD-10-CM | POA: Diagnosis not present

## 2021-12-04 DIAGNOSIS — F3110 Bipolar disorder, current episode manic without psychotic features, unspecified: Secondary | ICD-10-CM | POA: Diagnosis not present

## 2021-12-04 DIAGNOSIS — G2581 Restless legs syndrome: Secondary | ICD-10-CM

## 2021-12-04 DIAGNOSIS — G251 Drug-induced tremor: Secondary | ICD-10-CM | POA: Diagnosis not present

## 2021-12-04 DIAGNOSIS — F5105 Insomnia due to other mental disorder: Secondary | ICD-10-CM

## 2021-12-04 MED ORDER — OLANZAPINE 7.5 MG PO TABS: 7.5000 mg | ORAL_TABLET | Freq: Every day | ORAL | 1 refills | Status: DC

## 2021-12-04 NOTE — Progress Notes (Signed)
Casey [name] [id number] 20-Jan-1946 77 y.o.    Subjective:   Patient ID:  Casey [name] is a 76 y.o. (DOB Sep 24, 1945) male.  Chief Complaint:  Chief Complaint  Patient presents with   Follow-up    HPI seen with wife Casey [name] presents for follow-up of bipolar disorder and anxiety and history of confusion  At visit  Dec 02, 2018.  He was having confusion and benztropine was stopped to see if that was the cause.  Also it was having balance issues and therefore Trileptal was switched all to nighttime 1200 mg nightly. Confusion much better per both of them with DC benztropine.  Balance better with change Trileptal.   visit was January 13, 2019 and he was doing well as noted above and he was encouraged not to make further med changes at that time.  visit April 13, 2019.  He was doing better with regard to depression than he had in quite some time on the Wellbutrin and no meds were changed.  He was having health problems with pulmonary effusion and fluid removed twice related to fall in January 2020.  seen September 13, 2019.  For residual depression the following decisions were made: Balance problem much improved with dosing Trileptal all in the evening. Depression under partial control & wife wants it to be better per his wife.  No mood swings.   Increase bupropion XL to 2 of the 150 mg tablets in the morning until the current bottle is gone. Then new RX will be 1 of the 300 mg tablets each morning.  November 08, 2019 appointment the following is noted:  Seen with wife, Casey [name]. Small amount of improvement with change.  Anxiety got worse.  Notices when he goes out.  Worries about social and political things.  Wife keeps him active at home.  Allerigies bother him..  No sig caffeine except tea when goes out.  Wife sees him as a little more active and initiative.  More productive.  Started some cooking.  Getting out and up better.  A little more talkative.   "He wants to be hyper but we  know what happens". Wife Casey [name] thinks   But for the last 2 years he's done nothing and laid around all the time.  He's a lot more active now. Big improvement. Went to church first time in 2 years. Plan:  For TRD, Pramipexole 0.25 mg tablet 1/2 twice daily for 1 week then 1 twice daily.  12/20/2019 appointment with the following noted:  Seen with wife. A little better motivated to get out but not to get in crowds.  Wife agrees more motivated and went to church once alone without pressure.  Wife says he's more talkative and much  Better interest.  Still not motivated to do things like mow the grass.  Others's have seen a difference. Concentration and memory are improved but not normal. No Se pramipexole.  Getting out of bed earlier and better now.. Wife admisters meds bc earlier he messsed them up. Plan: Wife has seen a big improvement therefore Partial improvement is clear from this so increase Pramipexole 0.375 mg tablet  twice daily  Anxiety is not fully managed.  Partly related to depression. Tremor is manageable.    02/15/20 appt with the following noted:  Send with wife Casey [name] Doing good.  Increased pramipexole as indicated.  Tolerating meds Wellbutrin XL 300 mg AM & Trileptal 1200 mg HS with it. Much better with depression.  Stays up  more and speinding less time in bed.   Wife agrees he's much better and handling things better. 2 1/2 yo 4th cousin girl drowned in pond lately.  Big family. Wife pleased he handled it better. SE constipation ? Related. History Linzess but diarrhea. He's not highly motivated but it is better.  Depression is much improved.  Had been depressed for years. Plan no med changes  06/13/2020 appointment with the following noted: Pretty good but cardiologist noted today increased pulse needing treatment.  Uncontrolled afib even after ablation. Hard to tell about depression bc of fatigue Dt heart problems. Wife said he did well Thanksgiving and participated more in  conversation per wife. Plan: no med changes  09/06/2020 appointment with following noted: Remains on Wellbutrin XL 300 mg every morning, ginkgo biloba 40 mg twice daily, oxcarbazepine 1200 mg nightly, pramipexole 0.75 mg twice daily. Less sig health concerns. He thinks mood has been pretty good.  Consistent with meds. Wife thinks he stays tired and sleepy and wonders if can give him a stimulant.   Sleep good.  Will nap.  Doesn't bother him.  Walks dog 20 min in AM.  Not much initiative per wife.  Not depressed but when depressed he stayed in the bed lasting a couple of years.  History of OSA but lost weight and doesn't think he has apnea anymore.  No CPAP. Chronic afib. Not much caffeine. Plan: Wife& patient has seen a big improvement with Pramipexole 0.375 mg tablet  twice daily They ask consideration be given to the following: If his energy gets better by treating his tachycardia they would like to consider reducing the pramipexole or possibly eliminating it at follow-up.   For energy trial reduction pramipexole to 0.25 mg BID.  If gets more depressed call. Alternative of modafinil 100  12/04/2020 appointment with the following noted: Energy is better with awakening earlier at 45 and doing more and getting out more.  Going to bed later with 8 hour  Sleep. Mood fluctuates with more reacting to wife mainly.  More outspoken.  Per W he has to vent.  She thinks he is too focused on politics and issues.   Plan: Depression under control.  No mood swings.   Continue bupropion XL 300 mg tablets each morning. Continue Trileptal 600 mg 2 at HS. No higher DT anxiety risk and no traditional stimulants DT afib.  Wife & patient has seen a big improvement with Pramipexole and able to reduce to  0.25 mg tablet  twice daily  02/20/2021 appointment with the following noted:  seen with wife Prednisone made him real hyper.  Been off of it for at least a couple weeks.  Hyperverbal and hyperactive.  Hip pain  interferes with sleep.  Sees doctor tomorrow. Average 3-4 hours at night and no naps bc busy.  No excessive spending but more than usual.  Denies appetite disturbance.  Patient reports that energy and motivation have been reduced.  Patient has difficulty with concentration and memory.  Patient denies any suicidal ideation.  W had MI in June. A/P: mild mania Wean Wellbutrin over 2 weeks. Continue Trileptal 600 mg 2 at HS. Wife & patient has seen a big improvement with Pramipexole and able to reduce to  0.25 mg tablet  twice daily. Continue it unless mania does not resolve with less bupropion.  03/26/2021 phone call: Casey [name] stated [place] has slightly improved since last week.He has not been able to sleep and was up at 2 am texting people in  a disoriented state.His pcp put him on Ambien 10 mg and this has helped him sleep.However,linda stated he is still working him self too much.She said he is jittery and has to have something to do at all times.He does not want to slow down or get rest.She thinks he needs to cutback on the Mirapex as well. MD response:  I agree with his wife about reducing Mirapex from 1 tablet twice a day to half tablet twice a day for 1 week and if his symptoms are not resolved and stop the Mirapex.  04/18/2021 phone call with wife and MD: Patient is still manic after stopping Wellbutrin and pramipexole.  Wife's concern is hard to get him to settle down at night.  Reviewed with wife prior psychiatric medications which are multiple.  Typically one would use an antipsychotic in this case but she does not think that they worked in the past.  She says he has been on higher doses of Trileptal and tolerated it. Increase Trileptal to 600 mg every morning and 1200 mg nightly for mania.  Keep scheduled appointment.  05/02/2021 wife reports patient still manic: We cannot go higher in the Trileptal because of balance issues.  Therefore olanzapine 10 mg nightly was started and we discussed the option  of hospitalization.  We will try to get the patient in urgently to an appointment.  05/07/2021 appointment with the following noted: seen with wife, Casey [name] Not doing too well.  Trouble getting out of bed.  Wife says he's still up in middle of the night but he says he's sleeping better.  Sleep in recliner downstairs. She says he's still manic with hyperactive, hyper graphia but won't show her what he's writing. On olanzapine  No changes off Wellbutrin and pramipexole. Apparently more balance problems with increase Trileptal to 1800 mg daily.  Dropped back to 1200 mg HS. Admits to some confusion.  Has pulled off labels on bottle per wife. She thinks he's averaging 4-6 hours of sleep and a little better than it was. Wife said he started cussing and he has not done that in the past. Other episodes of inappropriate decision making.  He recognizes he's manic but can't control himself.  Hyperverbal, pressured.  In past had speech problems with mania also.  Neg neuro work up for stroke in the past. SE balance and tremor. Some trouble getting out of chair? Plan: For mania,Increase olanzapine to 15 mg PM Follow-up Friday for urgent visit  05/11/2021 appointment with the following noted: Calming down a little. He thinks he's sleeping better.  She says he needs whole Ambien with olanzapine and last night still not sleeping.  He woke her with complaints of back pain. She's not seeing SE and nor is he.  She's not seeing much change  He says back pain is complicating his sleep and his problem. He tries to stay active.   She says he talks incessantly but not as much to her. She says major life events have triggered mania in him in the past. She notices him making poor decisions. Plan: Increase olanzapine to 20 mg PM  05/24/2021 appointment with the following noted: with D Nevin Bloodgood A week ago went of to doctor's office alone and wife called bc he was agitated. Per wife still going wide open but is sleeping  better. Yesterday had fever and talking out of his head and dx UTI. He still feels driven to do chores but distractible and inefficient.   D does not see improvement and  nor does wife.   7 hour sleep lately. Plan: Increase olanzapine to 30 mg in evening Reduce oxcarbazepine to 1 and 1/2 tablets in the evening.  06/04/2021 appointment: Seen with wife Sleeping more 7-9 hours.   Per wife still some confusion and still short tempered.  Less pressured sleep. He won't go to bed when sleepy and waits too long to get in bed and is weak and cannot do it by himself. Wife agrees he's less "wide open" than before and will be able to sit and watch TV show now. B died yesterday. Plan: Continue olanzapine to 30 mg in evening Reduce oxcarbazepine  again to 1 tablets in the evening for 1 week, then 1/2 tablet at night for a week then stop it.  07/04/21 appt noted:  seen with wife Casey [name] Hyper-ness is better.  Some trouble with concentration but able to make peanut butter fudge. Problems with afib.   Sleeping a lot and some instability.  Goes to sleep in chair.  Better with irritability.  No longer talking constantly but still some in spells.   Not depressed.   No change in balance off oxcarbazepine. Wife said last week mixed up meds and got oversedated.  She discovered the option. Plan: Reduce olanzapine to 20 mg PM now that mania is largely resolved.  08/06/2021 appointment with the following noted: Only psych med is reduced olanzapine 20 mg HS RLS and fidgety.  All day.  Can sit for an hour.  Uncomfortable when sitting. Stopped Ginko and wife noticed RLS more.  She restarted it.  No longer rocking back and forth. Caffeine tea at lunch.  No coffee..  [place] some. RLS recently started. Bipolar sx great per wife.  Not wanting to go out much. CO tiredness.  Falls asleep in recliner.  Sleep all night. Eats sweets all his life. Plan: No longer manic. Reduce olanzapine to 15 mg earlier in PM Stop  caffeine  09/06/2021 appointment with the following noted: Reduced olanzapine 15 mg and still RLS all the day. No change in that.  But can sit in church ok.  Will move legs when standing and talking to people. Sleeping more than he should from 10 until 9. And still naps and little initiative bc tired. Not depressed but not back to normal.  Not as social as usual. Loves to eat out. Plan: Reduce olanzapine from 15 to 7.5 mg earlier in PM Stop caffeine  10/02/2021 appointment with the following noted: seen again with wife Casey [name] Only psych med is the reduced dose of olanzapine 7.5 mg for 30 days. Back pain limits activity and getting shots. Taking tramadol for pain.  1-2 prn.  One helps. 2 is sedating. RLS is better and mainly now the problem is back pain.  Has to sleep in recliner DT pain.   Casey [name] says hard to tell about effect from reduced olanzapine bc he's having such back pain and taking tramadol.  Doesn't feel like doing anything. Plan continue olanzapine 7.5 mg every afternoon  12/04/2021 appointment with the following noted: seen with wife Still doing well with olanzapine 7.5 mg PM No SE.  RLS is OK now. Sleep good 8-10 hours. Not depressed nor manic sx.   Happy with meds.   Wife gall bladder surgery. H helping wife.  Past Psychiatric Medication Trials: Seroquel 300,  olanzapine 30 resolved mania,  (Improved  with Increase olanzapine to 30 mg PM and off the oxcarbazepine.) perphenazine, risperidone,  Latuda,  Abilify, Lithium did great helped but  SE afib, kidneys and prostate,    alprazolam,  Depakote, Trileptal, carbamazepine, lamotrigine caused nausea, citalopram, sertraline 150,   Wellbutrin 300 anxiety,  Pramipexole 0.375 BID Benztropine confusion        1 prior psych hosp depression                                                    Review of Systems:  Review of Systems  Constitutional:  Positive for fatigue.  Eyes:  Negative for visual disturbance.  Respiratory:   Negative for shortness of breath.   Gastrointestinal:  Positive for constipation.  Musculoskeletal:  Positive for arthralgias, back pain and gait problem.  Skin:  Negative for rash.       chronic  Neurological:  Positive for tremors and weakness.       Balance px longterm no worse RLS day and PM  Psychiatric/Behavioral:  Negative for agitation, behavioral problems, confusion, decreased concentration, dysphoric mood, hallucinations, self-injury, sleep disturbance and suicidal ideas. The patient is not nervous/anxious and is not hyperactive.    Medications: I have reviewed the patient's current medications.  Current Outpatient Medications  Medication Sig Dispense Refill   acetaminophen (TYLENOL) 500 MG tablet Take 1,000 mg by mouth every 6 (six) hours as needed for headache.     Albuterol Sulfate 108 (90 Base) MCG/ACT AEPB Inhale 1-2 puffs into the lungs as needed.     bisacodyl (DULCOLAX) 5 MG EC tablet Take 5-15 mg by mouth daily as needed for moderate constipation.     cholecalciferol (VITAMIN D) 1000 UNITS tablet Take 2,000 Units by mouth daily.     fluticasone (FLONASE) 50 MCG/ACT nasal spray Place 2 sprays into both nostrils daily as needed.  11   furosemide (LASIX) 20 MG tablet Take 20 mg by mouth as needed. Take 1 Tablet as needed for swelling and weight gain 3 pounds overnight and 5 pounds in a week.     levothyroxine (SYNTHROID, LEVOTHROID) 25 MCG tablet Take 25 mcg by mouth daily before breakfast.     metoprolol succinate (TOPROL-XL) 50 MG 24 hr tablet Take 25 mg by mouth daily. Take with or immediately following a meal. Takes 1/2 tablet.     OLANZapine (ZYPREXA) 7.5 MG tablet Take 1 tablet (7.5 mg total) by mouth at bedtime. 90 tablet 1   pantoprazole (PROTONIX) 40 MG tablet TAKE ONE TABLET BY MOUTH ONCE DAILY 30 tablet 11   polyethylene glycol-electrolytes (TRILYTE) 420 g solution Take 4,000 mLs by mouth as directed. 4000 mL 0   rivaroxaban (XARELTO) 20 MG TABS tablet Take 20 mg  by mouth daily after supper.      SUPER B COMPLEX/C PO Take by mouth daily.     traMADol-acetaminophen (ULTRACET) 37.5-325 MG tablet Take 1-2 tablets by mouth every 4 (four) hours as needed. 1-2 tabs     No current facility-administered medications for this visit.    Medication Side Effects: resolved  Allergies:  Allergies  Allergen Reactions   Amlodipine Other (See Comments)    Stopped due to kidney issues   Vicodin [Hydrocodone-Acetaminophen] Other (See Comments)    Patient is unsure of reaction    Past Medical History:  Diagnosis Date   Arthritis    Atrial fibrillation ([place]) 01/12/2010   2D Echo EF=>55%   Bipolar disorder (HCC)    Chronic back pain  Depression    Dysrhythmia    AFib   History of colonic polyps    History of gout    Hyperlipidemia    Hypertension    Hypothyroidism    Morbid obesity ([place])    OSA (obstructive sleep apnea)    AHI was 27.62hr RDI was 34.3 hr REM 0.00hr; had CPAP years ago but no longer uses.   Prostatitis    Tubular adenoma of colon 01/2016    Family History  Problem Relation Age of Onset   Diabetes Mother    Heart failure Mother    Hypertension Mother    Hypertension Father    Cancer Paternal Grandmother    Stroke Sister    Liver disease Brother    Vascular Disease Brother    Arthritis Brother    Colon cancer Neg Hx     Social History   Socioeconomic History   Marital status: Married    Spouse name: Not on file   Number of children: Not on file   Years of education: Not on file   Highest education level: Not on file  Occupational History   Not on file  Tobacco Use   Smoking status: Never   Smokeless tobacco: Never  Substance and Sexual Activity   Alcohol use: No    Alcohol/week: 0.0 standard drinks   Drug use: No   Sexual activity: Yes    Birth control/protection: None  Other Topics Concern   Not on file  Social History Narrative   Not on file   Social Determinants of Health   Financial Resource Strain:  Not on file  Food Insecurity: Not on file  Transportation Needs: Not on file  Physical Activity: Not on file  Stress: Not on file  Social Connections: Not on file  Intimate Partner Violence: Not on file    Past Medical History, Surgical history, Social history, and Family history were reviewed and updated as appropriate.   Please see review of systems for further details on the patient's review from today.   Objective:   Physical Exam:  There were no vitals taken for this visit.  Physical Exam Constitutional:      General: He is not in acute distress. Musculoskeletal:        General: No deformity.  Neurological:     Mental Status: He is alert and oriented to person, place, and time.     Coordination: Coordination abnormal.     Gait: Gait abnormal.     Comments: Mild rocking motions legs only  Psychiatric:        Attention and Perception: He is attentive. He does not perceive auditory hallucinations.        Mood and Affect: Mood is not anxious or depressed. Affect is not labile, blunt, angry or inappropriate.        Speech: Speech is not rapid and pressured or tangential.        Behavior: Behavior normal. Behavior is not agitated.        Thought Content: Thought content normal. Thought content is not delusional. Thought content does not include homicidal or suicidal ideation. Thought content does not include suicidal plan.        Cognition and Memory: Cognition normal.        Judgment: Judgment normal.     Comments: Insight intact. No auditory or visual hallucinations. No delusions.  Mania resolved No Pressured speech. Emotional lability not present in office Gait is a little slow    Lab Review:  Component Value Date/Time   NA 141 12/28/2018 0941   K 4.0 12/28/2018 0941   CL 104 12/28/2018 0941   CO2 22 12/28/2018 0941   GLUCOSE 88 12/28/2018 0941   GLUCOSE 111 (H) 06/30/2018 1311   BUN 16 12/28/2018 0941   CREATININE 1.30 (H) 12/28/2018 0941   CALCIUM 9.4  12/28/2018 0941   PROT 6.6 12/28/2018 0941   ALBUMIN 4.5 12/28/2018 0941   AST 21 12/28/2018 0941   ALT 22 12/28/2018 0941   ALKPHOS 115 12/28/2018 0941   BILITOT 0.7 12/28/2018 0941   GFRNONAA 55 (L) 12/28/2018 0941   GFRAA 63 12/28/2018 0941       Component Value Date/Time   WBC 8.1 12/28/2018 0941   WBC 5.4 06/30/2018 1311   RBC 4.81 12/28/2018 0941   RBC 4.52 06/30/2018 1311   HGB 15.2 12/28/2018 0941   HCT 42.9 12/28/2018 0941   PLT 186 12/28/2018 0941   MCV 89 12/28/2018 0941   MCH 31.6 12/28/2018 0941   MCH 31.9 06/30/2018 1311   MCHC 35.4 12/28/2018 0941   MCHC 32.8 06/30/2018 1311   RDW 12.9 12/28/2018 0941   LYMPHSABS 1.1 12/28/2018 0941   MONOABS 0.5 06/30/2018 1311   EOSABS 0.1 12/28/2018 0941   BASOSABS 0.0 12/28/2018 0941    No results found for: POCLITH, LITHIUM   No results found for: PHENYTOIN, PHENOBARB, VALPROATE, CBMZ   .res Assessment: Plan:    [place] was seen today for follow-up.  Diagnoses and all orders for this visit:  Bipolar I disorder, most recent episode (or current) manic (HCC) -     OLANZapine (ZYPREXA) 7.5 MG tablet; Take 1 tablet (7.5 mg total) by mouth at bedtime.  Generalized anxiety disorder -     OLANZapine (ZYPREXA) 7.5 MG tablet; Take 1 tablet (7.5 mg total) by mouth at bedtime.  Secondary restless legs syndrome  Tremor due to multiple drugs  Insomnia due to mental condition -     OLANZapine (ZYPREXA) 7.5 MG tablet; Take 1 tablet (7.5 mg total) by mouth at bedtime.   History pseudodementia with depression which is resolved.  Greater than 50% of 30 min face to face time with patient was spent on counseling and coordination of care. We discussed Dx bipolar at 76 yo.  Bipolar depression has flipped into mania from prednisone.  Patient is very complicated because of weakness and medical problems.   Confusion resolved. Disc this at length and risk of crashing into depression if we can't ease him down.   Pt is no longer  manic and is not depressed.. no problem with less olanzapine.  Prednisone triggered some mania in recently  and persisted off prednisone.   Hopefully the shots won't trigger problems.  Disc risk mania with lower dose.  RLS better with less olanzapine.  Stop caffeine.  No longer manic. Disc dosing range. olanzapine 7.5 mg earlier in PM  Stop caffeine  Discussed side effect of each medication Fall precautions.  Follow-up 4-6 mos  Lynder Parents, MD, DFAPA   Please see After Visit Summary for patient specific instructions.  No future appointments.   No orders of the defined types were placed in this encounter.      -------------------------------

## 2021-12-26 ENCOUNTER — Ambulatory Visit (INDEPENDENT_AMBULATORY_CARE_PROVIDER_SITE_OTHER): Payer: Medicare Other | Admitting: Orthopaedic Surgery

## 2021-12-26 ENCOUNTER — Encounter: Payer: Self-pay | Admitting: Orthopaedic Surgery

## 2021-12-26 DIAGNOSIS — M545 Low back pain, unspecified: Secondary | ICD-10-CM

## 2021-12-26 DIAGNOSIS — G8929 Other chronic pain: Secondary | ICD-10-CM | POA: Diagnosis not present

## 2021-12-26 NOTE — Progress Notes (Signed)
Office Visit Note   Patient: Casey [name]           Date of Birth: [birth year]           MRN: [id number] Visit Date: 12/26/2021              Requested by: Sharilyn Sites, MD [address] [place],  [place] [address] PCP: Sharilyn Sites, MD  Left-sided low back pain    HPI: Patient is a pleasant 76 year old gentleman who is accompanied by a family member today.  He has a long history of low back pain.  His last MRI was in 2017 per the records.  Most of his pain is significant when he gets in and out of a car.  He is prescribed tramadol by Dr. Hilma Favors and he takes 1 to 2 pills as needed.  He was referred for epidural steroid injections at the end of April by [place].  He reports that this significantly helped his right-sided posterior back pain.  It only briefly helped the left.  He denies any radicular findings any loss of bowel or bladder control he does have back supports that he is tried in the past without much help.  He is also tried physical therapy  Assessment & Plan: Visit Diagnoses:  1. Chronic midline low back pain without sciatica     Plan: Had a long discussion with the patient and his daughter.  Understand that he has quite a bit of arthritis in his spine.  He has had epidural injections but did not help the left-sided posterior back pain.  Would be worth getting another MRI for further evaluation as he has not had 1 in 6 years.  He may be a candidate for a facet injection which may help with some of the residual pain.  He did ask about pain medication and we have told him that we would prefer that Dr. Hilma Favors continue to prescribe this.  He has seen Dr. Carloyn Manner before for his back who said he was not a surgical candidate.  Follow-Up Instructions: No follow-ups on file.   Ortho Exam  Patient is alert, oriented, no adenopathy, well-dressed, normal affect, normal respiratory effort. Examination of his low back he is somewhat stiff when he arises out of a chair.  His strength of his  lower extremities is not compromised.  He is tender to palpation focally over the posterior lower back does not have any radiation of symptoms.  No straight leg raise.  Sensation is at its baseline.  Imaging: No results found. No images are attached to the encounter.  Labs: Lab Results  Component Value Date   REPTSTATUS 04/05/2019 FINAL 03/31/2019   REPTSTATUS 03/31/2019 FINAL 03/31/2019   GRAMSTAIN  03/31/2019    RARE WBC PRESENT, PREDOMINANTLY MONONUCLEAR NO ORGANISMS SEEN Performed at [facility], [place] [address] [place], [place] [address]    CULT  03/31/2019    NO GROWTH 5 DAYS Performed at [facility] [address] [place], [place] [address]      Lab Results  Component Value Date   ALBUMIN 4.5 12/28/2018   ALBUMIN 4.1 06/30/2018   ALBUMIN 3.6 06/13/2018    No results found for: "MG" No results found for: "VD25OH"  No results found for: "PREALBUMIN"    Latest Ref Rng & Units 12/28/2018    9:41 AM 06/30/2018    1:11 PM 06/17/2018    5:40 AM  CBC EXTENDED  WBC 3.4 - 10.8 x10E3/uL 8.1  5.4  4.6   RBC 4.14 - 5.80 x10E6/uL 4.81  4.52  3.96   Hemoglobin 13.0 - 17.7 g/dL 15.2  14.4  13.0   HCT 37.5 - 51.0 % 42.9  43.9  38.9   Platelets 150 - 450 x10E3/uL 186  269  138   NEUT# 1.4 - 7.0 x10E3/uL 6.1  3.6    Lymph# 0.7 - 3.1 x10E3/uL 1.1  1.3       There is no height or weight on file to calculate BMI.  Orders:  Orders Placed This Encounter  Procedures   MR Lumbar Spine w/o contrast   No orders of the defined types were placed in this encounter.    Procedures: No procedures performed  Clinical Data: No additional findings.  ROS:  All other systems negative, except as noted in the HPI. Review of Systems  Objective: Vital Signs: There were no vitals taken for this visit.  Specialty Comments:  No specialty comments available.  PMFS History: Patient Active Problem List   Diagnosis Date Noted   Pain in left shoulder 02/21/2021    Constipation 12/28/2018   N&V (nausea and vomiting) 12/28/2018   GERD (gastroesophageal reflux disease) 12/28/2018   Abdominal pain 12/28/2018   Weight loss, unintentional 12/28/2018   Atrial fibrillation with RVR ([place])    Sepsis ([place]) 09/81/1914   Acute metabolic encephalopathy 78/29/5621   Thrombocytopenia ([place]) 06/13/2018   Renal insufficiency 06/13/2018   Anxiety 06/12/2018   Bipolar disorder ([place]) 06/12/2018   Chronic anticoagulation 05/05/2017   Sleep apnea 05/05/2017   Edema leg 05/05/2017   Dysphasia 02/07/2017   Mild obesity 05/10/2016   History of colonic polyps    Diverticulosis of colon without hemorrhage    Hyperlipidemia LDL goal <130 05/16/2013   KNEE PAIN 08/07/2009   KNEE SPRAIN, ACUTE 08/07/2009   COLONIC POLYPS, HX OF 05/10/2009   Hypothyroidism 05/08/2009   History of bipolar disorder 05/08/2009   Essential hypertension 05/08/2009   Permanent atrial fibrillation 05/08/2009   HEMATOCHEZIA 05/08/2009   LOW BACK PAIN, CHRONIC 05/08/2009   ABDOMINAL PAIN 05/08/2009   Past Medical History:  Diagnosis Date   Arthritis    Atrial fibrillation ([place]) 01/12/2010   2D Echo EF=>55%   Bipolar disorder ([place])    Chronic back pain    Depression    Dysrhythmia    AFib   History of colonic polyps    History of gout    Hyperlipidemia    Hypertension    Hypothyroidism    Morbid obesity ([place])    OSA (obstructive sleep apnea)    AHI was 27.62hr RDI was 34.3 hr REM 0.00hr; had CPAP years ago but no longer uses.   Prostatitis    Tubular adenoma of colon 01/2016    Family History  Problem Relation Age of Onset   Diabetes Mother    Heart failure Mother    Hypertension Mother    Hypertension Father    Cancer Paternal Grandmother    Stroke Sister    Liver disease Brother    Vascular Disease Brother    Arthritis Brother    Colon cancer Neg Hx     Past Surgical History:  Procedure Laterality Date   APPENDECTOMY  1959   asthma     CATARACT EXTRACTION Bilateral  1995   COLONOSCOPY  2011   RMR: left-sided diverticula, minimal rectal friability. No polyps. Repeat 5 years.   COLONOSCOPY WITH PROPOFOL N/A 01/15/2016   Dr.Rourk- diverticulosis in the entire examined colon, multiple  rectal and colonic polyps, internal hemorrhoids. bx= tubular adenomas and hyperplastic polyps.    HAND / FINGER LESION EXCISION Right    IR THORACENTESIS ASP PLEURAL SPACE W/IMG GUIDE  03/01/2019   IR THORACENTESIS ASP PLEURAL SPACE W/IMG GUIDE  03/31/2019   POLYPECTOMY  01/15/2016   Procedure: POLYPECTOMY;  Surgeon: Daneil Dolin, MD;  [facility];  Service: Endoscopy;;   right shoulder surgery     TONSILLECTOMY  1955   Social History   Occupational History   Not on file  Tobacco Use   Smoking status: Never   Smokeless tobacco: Never  Substance and Sexual Activity   Alcohol use: No    Alcohol/week: 0.0 standard drinks of alcohol   Drug use: No   Sexual activity: Yes    Birth control/protection: None

## 2022-01-09 ENCOUNTER — Ambulatory Visit (HOSPITAL_COMMUNITY)
Admission: RE | Admit: 2022-01-09 | Discharge: 2022-01-09 | Disposition: A | Discharge status: Home / Self Care | Payer: Medicare Other | Source: Ambulatory Visit | Attending: Orthopaedic Surgery | Admitting: Orthopaedic Surgery

## 2022-01-09 DIAGNOSIS — M48061 Spinal stenosis, lumbar region without neurogenic claudication: Secondary | ICD-10-CM | POA: Diagnosis not present

## 2022-01-09 DIAGNOSIS — G8929 Other chronic pain: Secondary | ICD-10-CM | POA: Insufficient documentation

## 2022-01-09 DIAGNOSIS — M5136 Other intervertebral disc degeneration, lumbar region: Secondary | ICD-10-CM | POA: Diagnosis not present

## 2022-01-09 DIAGNOSIS — M545 Low back pain, unspecified: Secondary | ICD-10-CM | POA: Diagnosis not present

## 2022-01-23 ENCOUNTER — Ambulatory Visit: Payer: Medicare Other | Admitting: Orthopaedic Surgery

## 2022-01-28 DIAGNOSIS — D6949 Other primary thrombocytopenia: Secondary | ICD-10-CM | POA: Diagnosis not present

## 2022-01-28 DIAGNOSIS — N183 Chronic kidney disease, stage 3 unspecified: Secondary | ICD-10-CM | POA: Diagnosis not present

## 2022-01-28 DIAGNOSIS — F319 Bipolar disorder, unspecified: Secondary | ICD-10-CM | POA: Diagnosis not present

## 2022-01-28 DIAGNOSIS — Z6831 Body mass index (BMI) 31.0-31.9, adult: Secondary | ICD-10-CM | POA: Diagnosis not present

## 2022-01-29 ENCOUNTER — Encounter: Payer: Self-pay | Admitting: Orthopaedic Surgery

## 2022-01-29 ENCOUNTER — Ambulatory Visit: Payer: Medicare Other | Admitting: Orthopaedic Surgery

## 2022-01-29 DIAGNOSIS — M5440 Lumbago with sciatica, unspecified side: Secondary | ICD-10-CM

## 2022-01-29 DIAGNOSIS — E039 Hypothyroidism, unspecified: Secondary | ICD-10-CM | POA: Diagnosis not present

## 2022-01-29 NOTE — Progress Notes (Signed)
Office Visit Note   Patient: Casey [name]           Date of Birth: Sep 07, 1945           MRN: [id number] Visit Date: 01/29/2022              Requested by: Sharilyn Sites, [place],  [place] [address] PCP: Sharilyn Sites, MD   Assessment & Plan: Visit Diagnoses:  1. Bilateral low back pain with sciatica, sciatica laterality unspecified, unspecified chronicity     Plan: Patient is a pleasant 76 year old gentleman with a long history of low back pain.  He comes in for an MRI review of his lumbar spine.  He had his last MRI in 2011.  Since his last visit he has gotten 2 injections with [facility] radiology.  1 was at L5-S1 on the right and the other was at L4-5 on the left.  He reports right now he has had complete relief of his symptoms.  He has much easier standing and sitting.  He has no radicular symptoms going down his legs.  We did update his MRI because of how long it had been.  The MRI demonstrated advanced facet arthropathy that had progressed at the L5-S1 level level.  He does have moderate right worse than left facet arthropathy at L4-5 he does have some foraminal stenosis.  He is doing much better today and we recommend if he has return of symptoms he should contact us we will make a referral for more injections if appropriate.  Also have reiterated the importance that he do lower back stabilization exercises.  He does have these exercises at home but admits he has not been consistent doing them  Follow-Up Instructions: No follow-ups on file.   Orders:  No orders of the defined types were placed in this encounter.  No orders of the defined types were placed in this encounter.     Procedures: No procedures performed   Clinical Data: No additional findings.   Subjective: Chief Complaint  Patient presents with   Lower Back - Follow-up   Patient presents today to discuss his MRI results of his lumbar spine that was preformed on 01/09/2022. Patient is  currently not taking any medications for his ongoing  pain.   Review of Systems  All other systems reviewed and are negative.    Objective: Vital Signs: There were no vitals taken for this visit.  Physical Exam Constitutional:      Appearance: Normal appearance.  Pulmonary:     Effort: Pulmonary effort is normal.  Skin:    General: Skin is warm and dry.  Neurological:     General: No focal deficit present.     Mental Status: He is alert.     Ortho Exam Examination of his lower back.  He has no tenderness to palpation today no sensory deficit no weakness in the lower extremities.  He arises much more easily in and out of a chair.  He is ambulating without the use of an assistive device.  Strength is intact and equal Specialty Comments:  No specialty comments available.  Imaging: No results found.   PMFS History: Patient Active Problem List   Diagnosis Date Noted   Pain in left shoulder 02/21/2021   Constipation 12/28/2018   N&V (nausea and vomiting) 12/28/2018   GERD (gastroesophageal reflux disease) 12/28/2018   Abdominal pain 12/28/2018   Weight loss, unintentional 12/28/2018   Atrial fibrillation with RVR ([place])  Sepsis ([place]) 56/31/4970   Acute metabolic encephalopathy 26/37/8588   Thrombocytopenia ([place]) 06/13/2018   Renal insufficiency 06/13/2018   Anxiety 06/12/2018   Bipolar disorder ([place]) 06/12/2018   Chronic anticoagulation 05/05/2017   Sleep apnea 05/05/2017   Edema leg 05/05/2017   Dysphasia 02/07/2017   Mild obesity 05/10/2016   History of colonic polyps    Diverticulosis of colon without hemorrhage    Hyperlipidemia LDL goal <130 05/16/2013   KNEE PAIN 08/07/2009   KNEE SPRAIN, ACUTE 08/07/2009   COLONIC POLYPS, HX OF 05/10/2009   Hypothyroidism 05/08/2009   History of bipolar disorder 05/08/2009   Essential hypertension 05/08/2009   Permanent atrial fibrillation 05/08/2009   HEMATOCHEZIA 05/08/2009   LOW BACK PAIN, CHRONIC 05/08/2009    ABDOMINAL PAIN 05/08/2009   Past Medical History:  Diagnosis Date   Arthritis    Atrial fibrillation ([place]) 01/12/2010   2D Echo EF=>55%   Bipolar disorder ([place])    Chronic back pain    Depression    Dysrhythmia    AFib   History of colonic polyps    History of gout    Hyperlipidemia    Hypertension    Hypothyroidism    Morbid obesity ([place])    OSA (obstructive sleep apnea)    AHI was 27.62hr RDI was 34.3 hr REM 0.00hr; had CPAP years ago but no longer uses.   Prostatitis    Tubular adenoma of colon 01/2016    Family History  Problem Relation Age of Onset   Diabetes Mother    Heart failure Mother    Hypertension Mother    Hypertension Father    Cancer Paternal Grandmother    Stroke Sister    Liver disease Brother    Vascular Disease Brother    Arthritis Brother    Colon cancer Neg Hx     Past Surgical History:  Procedure Laterality Date   APPENDECTOMY  1959   asthma     CATARACT EXTRACTION Bilateral 1995   COLONOSCOPY  2011   RMR: left-sided diverticula, minimal rectal friability. No polyps. Repeat 5 years.   COLONOSCOPY WITH PROPOFOL N/A 01/15/2016   Dr.Rourk- diverticulosis in the entire examined colon, multiple rectal and colonic polyps, internal hemorrhoids. bx= tubular adenomas and hyperplastic polyps.    HAND / FINGER LESION EXCISION Right    IR THORACENTESIS ASP PLEURAL SPACE W/IMG GUIDE  03/01/2019   IR THORACENTESIS ASP PLEURAL SPACE W/IMG GUIDE  03/31/2019   POLYPECTOMY  01/15/2016   Procedure: POLYPECTOMY;  Surgeon: Daneil Dolin, MD;  [facility];  Service: Endoscopy;;   right shoulder surgery     TONSILLECTOMY  1955   Social History   Occupational History   Not on file  Tobacco Use   Smoking status: Never   Smokeless tobacco: Never  Substance and Sexual Activity   Alcohol use: No    Alcohol/week: 0.0 standard drinks of alcohol   Drug use: No   Sexual activity: Yes    Birth control/protection: None

## 2022-01-31 ENCOUNTER — Telehealth: Payer: Self-pay | Admitting: *Deleted

## 2022-01-31 NOTE — Telephone Encounter (Signed)
Received VM from spouse stating they wanted to reschedule procedure from March. Patient last OV in February. Needs OV to reschedule. Please call pt and schedule. Thanks!

## 2022-02-10 NOTE — Progress Notes (Unsigned)
Referring Provider: Sharilyn Sites, MD Primary Care Physician:  Sharilyn Sites, MD Primary GI Physician: Dr. Gala Romney  Chief Complaint  Patient presents with   Colonoscopy    HPI:   Casey [name] is a 76 y.o. male with history of GERD, constipation, and colon polyps presenting today to discuss scheduling surveillance colonoscopy. Last colonoscopy in 2017 with tubular adenomas and hyperplastic polyps with recommendations to repeat in 5 years. He does have history of remote polypectomy bleed requiring transfusion and hospitalization in 2002.   Last seen in our office 09/10/21 to schedule colonoscopy. Had previously had some trouble with LE edema. Stated it was a medication he was on which had since been discontinued and symptoms resolved. Several medication adjustment had been made and prior constipation had also resolved. No longer requiring Linzess. No alarm symptoms. Acid reflux is well controlled on Protonix 40 mg daily. Planned to proceed with colonoscopy. Offered EGD for Barretts esophagus screening, but patient declined.   Patient cancelled colonoscopy due to back pain.   Today:  Doing well overall.  No trouble with constipation, diarrhea, BRBPR, melena, unintentional weight loss. GERD mains well controlled on Protonix 40 mg daily.  Denies nausea, vomiting, dysphagia, abdominal pain.  Past Medical History:  Diagnosis Date   Arthritis    Atrial fibrillation ([place]) 01/12/2010   2D Echo EF=>55%   Bipolar disorder ([facility])    Chronic back pain    Depression    Dysrhythmia    AFib   History of colonic polyps    History of gout    Hyperlipidemia    Hypertension    Hypothyroidism    Morbid obesity ([place])    OSA (obstructive sleep apnea)    AHI was 27.62hr RDI was 34.3 hr REM 0.00hr; had CPAP years ago but no longer uses.   Prostatitis    Tubular adenoma of colon 01/2016    Past Surgical History:  Procedure Laterality Date   APPENDECTOMY  1959   asthma     CATARACT EXTRACTION  Bilateral 1995   COLONOSCOPY  2011   RMR: left-sided diverticula, minimal rectal friability. No polyps. Repeat 5 years.   COLONOSCOPY WITH PROPOFOL N/A 01/15/2016   Dr.Rourk- diverticulosis in the entire examined colon, multiple rectal and colonic polyps, internal hemorrhoids. bx= tubular adenomas and hyperplastic polyps.    HAND / FINGER LESION EXCISION Right    IR THORACENTESIS ASP PLEURAL SPACE W/IMG GUIDE  03/01/2019   IR THORACENTESIS ASP PLEURAL SPACE W/IMG GUIDE  03/31/2019   POLYPECTOMY  01/15/2016   Procedure: POLYPECTOMY;  Surgeon: Daneil Dolin, MD;  [facility];  Service: Endoscopy;;   right shoulder surgery     TONSILLECTOMY  1955    Current Outpatient Medications  Medication Sig Dispense Refill   acetaminophen (TYLENOL) 500 MG tablet Take 1,000 mg by mouth every 6 (six) hours as needed for headache.     Albuterol Sulfate 108 (90 Base) MCG/ACT AEPB Inhale 1-2 puffs into the lungs as needed.     cholecalciferol (VITAMIN D) 1000 UNITS tablet Take 2,000 Units by mouth daily.     levothyroxine (SYNTHROID, LEVOTHROID) 25 MCG tablet Take 25 mcg by mouth daily before breakfast.     OLANZapine (ZYPREXA) 7.5 MG tablet Take 1 tablet (7.5 mg total) by mouth at bedtime. 90 tablet 1   pantoprazole (PROTONIX) 40 MG tablet TAKE ONE TABLET BY MOUTH ONCE DAILY 30 tablet 11   rivaroxaban (XARELTO) 20 MG TABS tablet Take 20 mg by mouth  daily after supper.      SUPER B COMPLEX/C PO Take by mouth daily.     bisacodyl (DULCOLAX) 5 MG EC tablet Take 5-15 mg by mouth daily as needed for moderate constipation. (Patient not taking: Reported on 02/11/2022)     No current facility-administered medications for this visit.    Allergies as of 02/11/2022 - Review Complete 02/11/2022  Allergen Reaction Noted   Amlodipine Other (See Comments) 11/02/2012   Vicodin [hydrocodone-acetaminophen] Other (See Comments) 01/15/2016    Family History  Problem Relation Age of Onset   Diabetes Mother     Heart failure Mother    Hypertension Mother    Hypertension Father    Cancer Paternal Grandmother    Stroke Sister    Liver disease Brother    Vascular Disease Brother    Arthritis Brother    Colon cancer Neg Hx     Social History   Socioeconomic History   Marital status: Married    Spouse name: Not on file   Number of children: Not on file   Years of education: Not on file   Highest education level: Not on file  Occupational History   Not on file  Tobacco Use   Smoking status: Never   Smokeless tobacco: Never  Substance and Sexual Activity   Alcohol use: No    Alcohol/week: 0.0 standard drinks of alcohol   Drug use: No   Sexual activity: Yes    Birth control/protection: None  Other Topics Concern   Not on file  Social History Narrative   Not on file   Social Determinants of Health   Financial Resource Strain: Not on file  Food Insecurity: Not on file  Transportation Needs: Not on file  Physical Activity: Not on file  Stress: Not on file  Social Connections: Not on file    Review of Systems: Gen: Denies fever, chills, cold or flu like symptoms, pre-syncope, or syncope.  CV: Denies chest pain, palpitations. Resp: Denies dyspnea, cough.  GI: See HPI Heme: See HPI.  Physical Exam: BP (!) 153/83 (BP Location: Right Arm, Patient Position: Sitting, Cuff Size: Normal)   Pulse 84   Temp 97.7 F (36.5 C) (Temporal)   Ht 6' (1.829 m)   Wt 223 lb (101.2 kg)   BMI 30.24 kg/m  General:   Alert and oriented. No distress noted. Pleasant and cooperative.  Head:  Normocephalic and atraumatic. Eyes:  Conjuctiva clear without scleral icterus. Heart: Irregularly irregular rate and rhythm.  No tachycardia. Lungs:  Clear to auscultation bilaterally. No wheezes, rales, or rhonchi. No distress.  Abdomen:  +BS, soft, non-tender and non-distended. No rebound or guarding. No HSM or masses noted. Msk:  Symmetrical without gross deformities. Normal posture. Extremities:   Without edema. Neurologic:  Alert and  oriented x4 Psych:  Normal mood and affect.    Assessment:  76 year old male with history of atrial fibrillation on Xarelto, HTN, HLD, hypothyroidism, OSA, bipolar disorder, depression, GERD, adenomatous colon polyps, remote history of post polypectomy bleed in 2022 requiring hospitalization and blood transfusion, presenting today to discuss scheduling surveillance colonoscopy.  Last colonoscopy was in 2017 with tubular adenomas and hyperplastic polyps removed, recommended 5-year surveillance.  Clinically, he is doing well without any significant lower GI symptoms.  No alarm symptoms.  We will plan to proceed with a colonoscopy in the near future.   GERD: Remains well controlled on pantoprazole 40 mg daily.  Not interested in EGD for Barrett's esophagus screening.   Plan:  Proceed with colonoscopy with propofol by Dr. Gala Romney in near future. The risks, benefits, and alternatives have been discussed with the patient in detail. The patient states understanding and desires to proceed. ASA 3 Hold Xarelto x48 hours prior to procedure. No a.m. diabetes medications day of procedure. Continue pantoprazole 40 mg daily. Follow-up in 1 year or sooner if needed.   Aliene Altes, PA-C [facility] 02/11/2022

## 2022-02-10 NOTE — H&P (View-Only) (Signed)
Referring Provider: Sharilyn Sites, MD Primary Care Physician:  Sharilyn Sites, MD Primary GI Physician: Dr. Gala Romney  Chief Complaint  Patient presents with   Colonoscopy    HPI:   Casey [name] is a 76 y.o. male with history of GERD, constipation, and colon polyps presenting today to discuss scheduling surveillance colonoscopy. Last colonoscopy in 2017 with tubular adenomas and hyperplastic polyps with recommendations to repeat in 5 years. He does have history of remote polypectomy bleed requiring transfusion and hospitalization in 2002.   Last seen in our office 09/10/21 to schedule colonoscopy. Had previously had some trouble with LE edema. Stated it was a medication he was on which had since been discontinued and symptoms resolved. Several medication adjustment had been made and prior constipation had also resolved. No longer requiring Linzess. No alarm symptoms. Acid reflux is well controlled on Protonix 40 mg daily. Planned to proceed with colonoscopy. Offered EGD for Barretts esophagus screening, but patient declined.   Patient cancelled colonoscopy due to back pain.   Today:  Doing well overall.  No trouble with constipation, diarrhea, BRBPR, melena, unintentional weight loss. GERD mains well controlled on Protonix 40 mg daily.  Denies nausea, vomiting, dysphagia, abdominal pain.  Past Medical History:  Diagnosis Date   Arthritis    Atrial fibrillation ([place]) 01/12/2010   2D Echo EF=>55%   Bipolar disorder ([facility])    Chronic back pain    Depression    Dysrhythmia    AFib   History of colonic polyps    History of gout    Hyperlipidemia    Hypertension    Hypothyroidism    Morbid obesity ([place])    OSA (obstructive sleep apnea)    AHI was 27.62hr RDI was 34.3 hr REM 0.00hr; had CPAP years ago but no longer uses.   Prostatitis    Tubular adenoma of colon 01/2016    Past Surgical History:  Procedure Laterality Date   APPENDECTOMY  1959   asthma     CATARACT EXTRACTION  Bilateral 1995   COLONOSCOPY  2011   RMR: left-sided diverticula, minimal rectal friability. No polyps. Repeat 5 years.   COLONOSCOPY WITH PROPOFOL N/A 01/15/2016   Dr.Rourk- diverticulosis in the entire examined colon, multiple rectal and colonic polyps, internal hemorrhoids. bx= tubular adenomas and hyperplastic polyps.    HAND / FINGER LESION EXCISION Right    IR THORACENTESIS ASP PLEURAL SPACE W/IMG GUIDE  03/01/2019   IR THORACENTESIS ASP PLEURAL SPACE W/IMG GUIDE  03/31/2019   POLYPECTOMY  01/15/2016   Procedure: POLYPECTOMY;  Surgeon: Daneil Dolin, MD;  Location: [facility];  Service: Endoscopy;;   right shoulder surgery     TONSILLECTOMY  1955    Current Outpatient Medications  Medication Sig Dispense Refill   acetaminophen (TYLENOL) 500 MG tablet Take 1,000 mg by mouth every 6 (six) hours as needed for headache.     Albuterol Sulfate 108 (90 Base) MCG/ACT AEPB Inhale 1-2 puffs into the lungs as needed.     cholecalciferol (VITAMIN D) 1000 UNITS tablet Take 2,000 Units by mouth daily.     levothyroxine (SYNTHROID, LEVOTHROID) 25 MCG tablet Take 25 mcg by mouth daily before breakfast.     OLANZapine (ZYPREXA) 7.5 MG tablet Take 1 tablet (7.5 mg total) by mouth at bedtime. 90 tablet 1   pantoprazole (PROTONIX) 40 MG tablet TAKE ONE TABLET BY MOUTH ONCE DAILY 30 tablet 11   rivaroxaban (XARELTO) 20 MG TABS tablet Take 20 mg by mouth  daily after supper.      SUPER B COMPLEX/C PO Take by mouth daily.     bisacodyl (DULCOLAX) 5 MG EC tablet Take 5-15 mg by mouth daily as needed for moderate constipation. (Patient not taking: Reported on 02/11/2022)     No current facility-administered medications for this visit.    Allergies as of 02/11/2022 - Review Complete 02/11/2022  Allergen Reaction Noted   Amlodipine Other (See Comments) 11/02/2012   Vicodin [hydrocodone-acetaminophen] Other (See Comments) 01/15/2016    Family History  Problem Relation Age of Onset   Diabetes Mother     Heart failure Mother    Hypertension Mother    Hypertension Father    Cancer Paternal Grandmother    Stroke Sister    Liver disease Brother    Vascular Disease Brother    Arthritis Brother    Colon cancer Neg Hx     Social History   Socioeconomic History   Marital status: Married    Spouse name: Not on file   Number of children: Not on file   Years of education: Not on file   Highest education level: Not on file  Occupational History   Not on file  Tobacco Use   Smoking status: Never   Smokeless tobacco: Never  Substance and Sexual Activity   Alcohol use: No    Alcohol/week: 0.0 standard drinks of alcohol   Drug use: No   Sexual activity: Yes    Birth control/protection: None  Other Topics Concern   Not on file  Social History Narrative   Not on file   Social Determinants of Health   Financial Resource Strain: Not on file  Food Insecurity: Not on file  Transportation Needs: Not on file  Physical Activity: Not on file  Stress: Not on file  Social Connections: Not on file    Review of Systems: Gen: Denies fever, chills, cold or flu like symptoms, pre-syncope, or syncope.  CV: Denies chest pain, palpitations. Resp: Denies dyspnea, cough.  GI: See HPI Heme: See HPI.  Physical Exam: BP (!) 153/83 (BP Location: Right Arm, Patient Position: Sitting, Cuff Size: Normal)   Pulse 84   Temp 97.7 F (36.5 C) (Temporal)   Ht 6' (1.829 m)   Wt 223 lb (101.2 kg)   BMI 30.24 kg/m  General:   Alert and oriented. No distress noted. Pleasant and cooperative.  Head:  Normocephalic and atraumatic. Eyes:  Conjuctiva clear without scleral icterus. Heart: Irregularly irregular rate and rhythm.  No tachycardia. Lungs:  Clear to auscultation bilaterally. No wheezes, rales, or rhonchi. No distress.  Abdomen:  +BS, soft, non-tender and non-distended. No rebound or guarding. No HSM or masses noted. Msk:  Symmetrical without gross deformities. Normal posture. Extremities:   Without edema. Neurologic:  Alert and  oriented x4 Psych:  Normal mood and affect.    Assessment:  76 year old male with history of atrial fibrillation on Xarelto, HTN, HLD, hypothyroidism, OSA, bipolar disorder, depression, GERD, adenomatous colon polyps, remote history of post polypectomy bleed in 2022 requiring hospitalization and blood transfusion, presenting today to discuss scheduling surveillance colonoscopy.  Last colonoscopy was in 2017 with tubular adenomas and hyperplastic polyps removed, recommended 5-year surveillance.  Clinically, he is doing well without any significant lower GI symptoms.  No alarm symptoms.  We will plan to proceed with a colonoscopy in the near future.   GERD: Remains well controlled on pantoprazole 40 mg daily.  Not interested in EGD for Barrett's esophagus screening.   Plan:  Proceed with colonoscopy with propofol by Dr. Gala Romney in near future. The risks, benefits, and alternatives have been discussed with the patient in detail. The patient states understanding and desires to proceed. ASA 3 Hold Xarelto x48 hours prior to procedure. No a.m. diabetes medications day of procedure. Continue pantoprazole 40 mg daily. Follow-up in 1 year or sooner if needed.   Aliene Altes, PA-C [facility] 02/11/2022

## 2022-02-11 ENCOUNTER — Encounter: Payer: Self-pay | Admitting: Gastroenterology

## 2022-02-11 ENCOUNTER — Ambulatory Visit (INDEPENDENT_AMBULATORY_CARE_PROVIDER_SITE_OTHER): Payer: Medicare Other | Admitting: Gastroenterology

## 2022-02-11 VITALS — BP 153/83 | HR 84 | Temp 97.7°F | Ht 72.0 in | Wt 223.0 lb

## 2022-02-11 DIAGNOSIS — I219 Acute myocardial infarction, unspecified: Secondary | ICD-10-CM | POA: Diagnosis not present

## 2022-02-11 DIAGNOSIS — K219 Gastro-esophageal reflux disease without esophagitis: Secondary | ICD-10-CM | POA: Diagnosis not present

## 2022-02-11 DIAGNOSIS — Z86010 Personal history of colonic polyps: Secondary | ICD-10-CM | POA: Diagnosis not present

## 2022-02-11 DIAGNOSIS — N183 Chronic kidney disease, stage 3 unspecified: Secondary | ICD-10-CM | POA: Diagnosis not present

## 2022-02-11 DIAGNOSIS — I4821 Permanent atrial fibrillation: Secondary | ICD-10-CM | POA: Diagnosis not present

## 2022-02-11 DIAGNOSIS — E039 Hypothyroidism, unspecified: Secondary | ICD-10-CM | POA: Diagnosis not present

## 2022-02-11 NOTE — Patient Instructions (Signed)
We will arrange for you to have a colonoscopy in the near future with Dr. Gala Romney. You will need to hold your Xarelto for 48 hours prior to your procedure.  Continue pantoprazole 40 mg daily 30 minutes before breakfast.  We will follow-up with you in 1 year or sooner if needed.  Do not hesitate to call if you have any questions or concerns prior to your next visit.  It was very nice to meet you today!  Aliene Altes, PA-C [facility]

## 2022-02-12 ENCOUNTER — Encounter: Payer: Self-pay | Admitting: *Deleted

## 2022-02-12 ENCOUNTER — Telehealth: Payer: Self-pay | Admitting: *Deleted

## 2022-02-12 MED ORDER — PEG 3350-KCL-NA BICARB-NACL 420 G PO SOLR: 4000.0000 mL | Freq: Once | ORAL | 0 refills | Status: AC

## 2022-02-12 NOTE — Telephone Encounter (Signed)
CALLED PT. Scheduled for TCS with Dr. Gala Romney asa 3 on 8/30 at [place] will mail instructions/pre-op. Rx sent to pharmacy.

## 2022-03-07 NOTE — Patient Instructions (Signed)
Casey [name]  03/07/2022     '@PREFPERIOPPHARMACY'$ @   Your procedure is scheduled on  03/13/2022.   Report to [facility] at  0900  A.M.   Call this number if you have problems the morning of surgery:  [phone]   Remember:  Follow the diet and prep instructions given to you by the office.     Your last dose of xarelto should be on 03/10/2022.     Take these medicines the morning of surgery with A SIP OF WATER                          synthroid, toprol, protonix.     Do not wear jewelry, make-up or nail polish.  Do not wear lotions, powders, or perfumes, or deodorant.  Do not shave 48 hours prior to surgery.  Men may shave face and neck.  Do not bring valuables to the hospital.  [facility] is not responsible for any belongings or valuables.  Contacts, dentures or bridgework may not be worn into surgery.  Leave your suitcase in the car.  After surgery it may be brought to your room.  For patients admitted to the hospital, discharge time will be determined by your treatment team.  Patients discharged the day of surgery will not be allowed to drive home and must have someone with them for 24 hours.    Special instructions:   DO NOT smoke tobacco or vape for 24 hours before your procedure.  Please read over the following fact sheets that you were given. Anesthesia Post-op Instructions and Care and Recovery After Surgery      Colonoscopy, Adult, Care After The following information offers guidance on how to care for yourself after your procedure. Your health care provider may also give you more specific instructions. If you have problems or questions, contact your health care provider. What can I expect after the procedure? After the procedure, it is common to have: A small amount of blood in your stool for 24 hours after the procedure. Some gas. Mild cramping or bloating of your abdomen. Follow these instructions at home: Eating and drinking  Drink enough  fluid to keep your urine pale yellow. Follow instructions from your health care provider about eating or drinking restrictions. Resume your normal diet as told by your health care provider. Avoid heavy or fried foods that are hard to digest. Activity Rest as told by your health care provider. Avoid sitting for a long time without moving. Get up to take short walks every 1-2 hours. This is important to improve blood flow and breathing. Ask for help if you feel weak or unsteady. Return to your normal activities as told by your health care provider. Ask your health care provider what activities are safe for you. Managing cramping and bloating  Try walking around when you have cramps or feel bloated. If directed, apply heat to your abdomen as told by your health care provider. Use the heat source that your health care provider recommends, such as a moist heat pack or a heating pad. Place a towel between your skin and the heat source. Leave the heat on for 20-30 minutes. Remove the heat if your skin turns bright red. This is especially important if you are unable to feel pain, heat, or cold. You have a greater risk of getting burned. General instructions If you were given a sedative during the procedure, it can  affect you for several hours. Do not drive or operate machinery until your health care provider says that it is safe. For the first 24 hours after the procedure: Do not sign important documents. Do not drink alcohol. Do your regular daily activities at a slower pace than normal. Eat soft foods that are easy to digest. Take over-the-counter and prescription medicines only as told by your health care provider. Keep all follow-up visits. This is important. Contact a health care provider if: You have blood in your stool 2-3 days after the procedure. Get help right away if: You have more than a small spotting of blood in your stool. You have large blood clots in your stool. You have swelling  of your abdomen. You have nausea or vomiting. You have a fever. You have increasing pain in your abdomen that is not relieved with medicine. These symptoms may be an emergency. Get help right away. Call 911. Do not wait to see if the symptoms will go away. Do not drive yourself to the hospital. Summary After the procedure, it is common to have a small amount of blood in your stool. You may also have mild cramping and bloating of your abdomen. If you were given a sedative during the procedure, it can affect you for several hours. Do not drive or operate machinery until your health care provider says that it is safe. Get help right away if you have a lot of blood in your stool, nausea or vomiting, a fever, or increased pain in your abdomen. This information is not intended to replace advice given to you by your health care provider. Make sure you discuss any questions you have with your health care provider. Document Revised: 02/21/2021 Document Reviewed: 02/21/2021 Elsevier Patient Education  [place] After This sheet gives you information about how to care for yourself after your procedure. Your health care provider may also give you more specific instructions. If you have problems or questions, contact your health care provider. What can I expect after the procedure? After the procedure, it is common to have: Tiredness. Forgetfulness about what happened after the procedure. Impaired judgment for important decisions. Nausea or vomiting. Some difficulty with balance. Follow these instructions at home: For the time period you were told by your health care provider:     Rest as needed. Do not participate in activities where you could fall or become injured. Do not drive or use machinery. Do not drink alcohol. Do not take sleeping pills or medicines that cause drowsiness. Do not make important decisions or sign legal documents. Do not take care  of children on your own. Eating and drinking Follow the diet that is recommended by your health care provider. Drink enough fluid to keep your urine pale yellow. If you vomit: Drink water, juice, or soup when you can drink without vomiting. Make sure you have little or no nausea before eating solid foods. General instructions Have a responsible adult stay with you for the time you are told. It is important to have someone help care for you until you are awake and alert. Take over-the-counter and prescription medicines only as told by your health care provider. If you have sleep apnea, surgery and certain medicines can increase your risk for breathing problems. Follow instructions from your health care provider about wearing your sleep device: Anytime you are sleeping, including during daytime naps. While taking prescription pain medicines, sleeping medicines, or medicines that make you drowsy. Avoid smoking.  Keep all follow-up visits as told by your health care provider. This is important. Contact a health care provider if: You keep feeling nauseous or you keep vomiting. You feel light-headed. You are still sleepy or having trouble with balance after 24 hours. You develop a rash. You have a fever. You have redness or swelling around the IV site. Get help right away if: You have trouble breathing. You have new-onset confusion at home. Summary For several hours after your procedure, you may feel tired. You may also be forgetful and have poor judgment. Have a responsible adult stay with you for the time you are told. It is important to have someone help care for you until you are awake and alert. Rest as told. Do not drive or operate machinery. Do not drink alcohol or take sleeping pills. Get help right away if you have trouble breathing, or if you suddenly become confused. This information is not intended to replace advice given to you by your health care provider. Make sure you discuss  any questions you have with your health care provider. Document Revised: 06/05/2021 Document Reviewed: 06/03/2019 Elsevier Patient Education  [place].

## 2022-03-08 ENCOUNTER — Other Ambulatory Visit (HOSPITAL_COMMUNITY): Payer: Medicare Other

## 2022-03-11 ENCOUNTER — Encounter (HOSPITAL_COMMUNITY)
Admission: RE | Admit: 2022-03-11 | Discharge: 2022-03-11 | Disposition: A | Discharge status: Home / Self Care | Payer: Medicare Other | Source: Ambulatory Visit | Attending: Internal Medicine | Admitting: Internal Medicine

## 2022-03-11 ENCOUNTER — Encounter (HOSPITAL_COMMUNITY): Payer: Self-pay

## 2022-03-11 VITALS — BP 134/94 | HR 77 | Temp 98.5°F | Resp 18 | Ht 72.0 in | Wt 223.1 lb

## 2022-03-11 DIAGNOSIS — T502X5A Adverse effect of carbonic-anhydrase inhibitors, benzothiadiazides and other diuretics, initial encounter: Secondary | ICD-10-CM | POA: Diagnosis not present

## 2022-03-11 DIAGNOSIS — Z01818 Encounter for other preprocedural examination: Secondary | ICD-10-CM | POA: Diagnosis not present

## 2022-03-11 HISTORY — DX: Unspecified asthma, uncomplicated: J45.909

## 2022-03-11 HISTORY — DX: Gastro-esophageal reflux disease without esophagitis: K21.9

## 2022-03-11 LAB — BASIC METABOLIC PANEL
Anion gap: 6 (ref 5–15)
BUN: 18 mg/dL (ref 8–23)
CO2: 25 mmol/L (ref 22–32)
Calcium: 9 mg/dL (ref 8.9–10.3)
Chloride: 110 mmol/L (ref 98–111)
Creatinine, Ser: 1.18 mg/dL (ref 0.61–1.24)
GFR, Estimated: >60 mL/min (ref >60)
Glucose, Bld: 91 mg/dL (ref 70–99)
Potassium: 3.6 mmol/L (ref 3.5–5.1)
Sodium: 141 mmol/L (ref 135–145)

## 2022-03-13 ENCOUNTER — Encounter (HOSPITAL_COMMUNITY)
Admission: RE | Disposition: A | Discharge status: Home / Self Care | Payer: Self-pay | Source: Home / Self Care | Attending: Internal Medicine

## 2022-03-13 ENCOUNTER — Ambulatory Visit (HOSPITAL_COMMUNITY): Payer: Medicare Other | Admitting: Anesthesiology

## 2022-03-13 ENCOUNTER — Ambulatory Visit (HOSPITAL_BASED_OUTPATIENT_CLINIC_OR_DEPARTMENT_OTHER): Payer: Medicare Other | Admitting: Anesthesiology

## 2022-03-13 ENCOUNTER — Ambulatory Visit (HOSPITAL_COMMUNITY)
Admission: RE | Admit: 2022-03-13 | Discharge: 2022-03-13 | Disposition: A | Discharge status: Home / Self Care | Payer: Medicare Other | Attending: Internal Medicine | Admitting: Internal Medicine

## 2022-03-13 ENCOUNTER — Encounter (HOSPITAL_COMMUNITY): Payer: Self-pay | Admitting: Internal Medicine

## 2022-03-13 DIAGNOSIS — D123 Benign neoplasm of transverse colon: Secondary | ICD-10-CM | POA: Diagnosis not present

## 2022-03-13 DIAGNOSIS — Z7901 Long term (current) use of anticoagulants: Secondary | ICD-10-CM | POA: Diagnosis not present

## 2022-03-13 DIAGNOSIS — E039 Hypothyroidism, unspecified: Secondary | ICD-10-CM | POA: Diagnosis not present

## 2022-03-13 DIAGNOSIS — I10 Essential (primary) hypertension: Secondary | ICD-10-CM | POA: Diagnosis not present

## 2022-03-13 DIAGNOSIS — I129 Hypertensive chronic kidney disease with stage 1 through stage 4 chronic kidney disease, or unspecified chronic kidney disease: Secondary | ICD-10-CM | POA: Diagnosis not present

## 2022-03-13 DIAGNOSIS — Z86010 Personal history of colonic polyps: Secondary | ICD-10-CM

## 2022-03-13 DIAGNOSIS — K219 Gastro-esophageal reflux disease without esophagitis: Secondary | ICD-10-CM | POA: Insufficient documentation

## 2022-03-13 DIAGNOSIS — N189 Chronic kidney disease, unspecified: Secondary | ICD-10-CM | POA: Diagnosis not present

## 2022-03-13 DIAGNOSIS — K5730 Diverticulosis of large intestine without perforation or abscess without bleeding: Secondary | ICD-10-CM | POA: Insufficient documentation

## 2022-03-13 DIAGNOSIS — F319 Bipolar disorder, unspecified: Secondary | ICD-10-CM | POA: Diagnosis not present

## 2022-03-13 DIAGNOSIS — D175 Benign lipomatous neoplasm of intra-abdominal organs: Secondary | ICD-10-CM

## 2022-03-13 DIAGNOSIS — G4733 Obstructive sleep apnea (adult) (pediatric): Secondary | ICD-10-CM | POA: Diagnosis not present

## 2022-03-13 DIAGNOSIS — Z1211 Encounter for screening for malignant neoplasm of colon: Secondary | ICD-10-CM | POA: Diagnosis not present

## 2022-03-13 DIAGNOSIS — Z79899 Other long term (current) drug therapy: Secondary | ICD-10-CM | POA: Insufficient documentation

## 2022-03-13 DIAGNOSIS — K5900 Constipation, unspecified: Secondary | ICD-10-CM | POA: Diagnosis not present

## 2022-03-13 DIAGNOSIS — K5790 Diverticulosis of intestine, part unspecified, without perforation or abscess without bleeding: Secondary | ICD-10-CM

## 2022-03-13 DIAGNOSIS — I4891 Unspecified atrial fibrillation: Secondary | ICD-10-CM | POA: Diagnosis not present

## 2022-03-13 DIAGNOSIS — J45909 Unspecified asthma, uncomplicated: Secondary | ICD-10-CM | POA: Diagnosis not present

## 2022-03-13 DIAGNOSIS — K635 Polyp of colon: Secondary | ICD-10-CM | POA: Diagnosis not present

## 2022-03-13 DIAGNOSIS — Z09 Encounter for follow-up examination after completed treatment for conditions other than malignant neoplasm: Secondary | ICD-10-CM

## 2022-03-13 HISTORY — PX: COLONOSCOPY WITH PROPOFOL: SHX5780

## 2022-03-13 HISTORY — PX: POLYPECTOMY: SHX5525

## 2022-03-13 SURGERY — COLONOSCOPY WITH PROPOFOL
Anesthesia: General

## 2022-03-13 MED ORDER — PROPOFOL 500 MG/50ML IV EMUL
INTRAVENOUS | Status: DC | PRN
  Administered 2022-03-13: 150 ug/kg/min via INTRAVENOUS

## 2022-03-13 MED ORDER — PROPOFOL 500 MG/50ML IV EMUL
INTRAVENOUS | Status: AC
  Filled 2022-03-13: qty 50

## 2022-03-13 MED ORDER — LACTATED RINGERS IV SOLN: INTRAVENOUS | Status: DC | PRN

## 2022-03-13 MED ORDER — PROPOFOL 10 MG/ML IV BOLUS
INTRAVENOUS | Status: DC | PRN
  Administered 2022-03-13: 60 mg via INTRAVENOUS

## 2022-03-13 NOTE — Discharge Instructions (Signed)
  Colonoscopy Discharge Instructions  Read the instructions outlined below and refer to this sheet in the next few weeks. These discharge instructions provide you with general information on caring for yourself after you leave the hospital. Your doctor may also give you specific instructions. While your treatment has been planned according to the most current medical practices available, unavoidable complications occasionally occur. If you have any problems or questions after discharge, call Dr. Gala Romney at [phone]. ACTIVITY You may resume your regular activity, but move at a slower pace for the next 24 hours.  Take frequent rest periods for the next 24 hours.  Walking will help get rid of the air and reduce the bloated feeling in your belly (abdomen).  No driving for 24 hours (because of the medicine (anesthesia) used during the test).   Do not sign any important legal documents or operate any machinery for 24 hours (because of the anesthesia used during the test).  NUTRITION Drink plenty of fluids.  You may resume your normal diet as instructed by your doctor.  Begin with a light meal and progress to your normal diet. Heavy or fried foods are harder to digest and may make you feel sick to your stomach (nauseated).  Avoid alcoholic beverages for 24 hours or as instructed.  MEDICATIONS You may resume your normal medications unless your doctor tells you otherwise.  WHAT YOU CAN EXPECT TODAY Some feelings of bloating in the abdomen.  Passage of more gas than usual.  Spotting of blood in your stool or on the toilet paper.  IF YOU HAD POLYPS REMOVED DURING THE COLONOSCOPY: No aspirin products for 7 days or as instructed.  No alcohol for 7 days or as instructed.  Eat a soft diet for the next 24 hours.  FINDING OUT THE RESULTS OF YOUR TEST Not all test results are available during your visit. If your test results are not back during the visit, make an appointment with your caregiver to find out the  results. Do not assume everything is normal if you have not heard from your caregiver or the medical facility. It is important for you to follow up on all of your test results.  SEEK IMMEDIATE MEDICAL ATTENTION IF: You have more than a spotting of blood in your stool.  Your belly is swollen (abdominal distention).  You are nauseated or vomiting.  You have a temperature over 101.  You have abdominal pain or discomfort that is severe or gets worse throughout the day.       4 polyps removed from your colon today  Colon polyp and diverticulosis information provided   further recommendations to follow pending review of pathology report  Resume Eliquis September 1   at patient request, I called Linda at [phone]-call rolled to [place] a message

## 2022-03-13 NOTE — Transfer of Care (Signed)
Immediate Anesthesia Transfer of Care Note  Patient: Casey [name]  Procedure(s) Performed: COLONOSCOPY WITH PROPOFOL POLYPECTOMY  Patient Location: PACU  Anesthesia Type:General  Level of Consciousness: awake, alert  and oriented  Airway & Oxygen Therapy: Patient Spontanous Breathing  Post-op Assessment: Report given to RN, Post -op Vital signs reviewed and stable, Patient moving all extremities X 4 and Patient able to stick tongue midline  Post vital signs: Reviewed  Last Vitals:  Vitals Value Taken Time  BP 151/69   Temp 97.5   Pulse 56   Resp 17   SpO2 100     Last Pain:  Vitals:   03/13/22 1123  TempSrc: Axillary  PainSc:       Patients Stated Pain Goal: 8 (92/11/94 1740)  Complications: No notable events documented.

## 2022-03-13 NOTE — Anesthesia Preprocedure Evaluation (Signed)
Anesthesia Evaluation  Patient identified by MRN, date of birth, ID band Patient awake    Reviewed: Allergy & Precautions, H&P , NPO status , Patient's Chart, lab work & pertinent test results, reviewed documented beta blocker date and time   Airway Mallampati: II  TM Distance: >3 FB Neck ROM: full    Dental no notable dental hx.    Pulmonary asthma , sleep apnea ,    Pulmonary exam normal breath sounds clear to auscultation       Cardiovascular Exercise Tolerance: Good hypertension, + dysrhythmias Atrial Fibrillation  Rhythm:regular Rate:Normal     Neuro/Psych PSYCHIATRIC DISORDERS Anxiety Depression Bipolar Disorder negative neurological ROS     GI/Hepatic Neg liver ROS, GERD  Medicated,  Endo/Other  Hypothyroidism   Renal/GU CRFRenal disease  negative genitourinary   Musculoskeletal   Abdominal   Peds  Hematology negative hematology ROS (+)   Anesthesia Other Findings   Reproductive/Obstetrics negative OB ROS                             Anesthesia Physical Anesthesia Plan  ASA: 3  Anesthesia Plan: General   Post-op Pain Management:    Induction:   PONV Risk Score and Plan: Propofol infusion  Airway Management Planned:   Additional Equipment:   Intra-op Plan:   Post-operative Plan:   Informed Consent: I have reviewed the patients History and Physical, chart, labs and discussed the procedure including the risks, benefits and alternatives for the proposed anesthesia with the patient or authorized representative who has indicated his/her understanding and acceptance.     Dental Advisory Given  Plan Discussed with: CRNA  Anesthesia Plan Comments:         Anesthesia Quick Evaluation

## 2022-03-13 NOTE — Interval H&P Note (Signed)
History and Physical Interval Note:  03/13/2022 10:53 AM  Casey [name]  has presented today for surgery, with the diagnosis of hx of colon polyps.  The various methods of treatment have been discussed with the patient and family. After consideration of risks, benefits and other options for treatment, the patient has consented to  Procedure(s) with comments: COLONOSCOPY WITH PROPOFOL (N/A) - 11:00am as a surgical intervention.  The patient's history has been reviewed, patient examined, no change in status, stable for surgery.  I have reviewed the patient's chart and labs.  Questions were answered to the patient's satisfaction.     Casey [name]   no change.  Eliquis that held as of 827.  Here for surveillance colonoscopy per plan. The risks, benefits, limitations, alternatives and imponderables have been reviewed with the patient. Questions have been answered. All parties are agreeable.

## 2022-03-13 NOTE — Op Note (Signed)
[facility] Patient Name: Casey [name] Procedure Date: 03/13/2022 10:32 AM MRN: [id number] Date of Birth: [birth year] Attending MD: Norvel Richards , MD CSN: [id number] Age: 76 Admit Type: Outpatient Procedure:                Colonoscopy Indications:              High risk colon cancer surveillance: Personal                            history of colonic polyps Providers:                Norvel Richards, MD, Janeece Riggers, RN, Raphael Gibney, Technician Referring MD:              Medicines:                Propofol per Anesthesia Complications:            No immediate complications. Estimated Blood Loss:     Estimated blood loss was minimal. Procedure:                Pre-Anesthesia Assessment:                           - Prior to the procedure, a History and Physical                            was performed, and patient medications and                            allergies were reviewed. The patient's tolerance of                            previous anesthesia was also reviewed. The risks                            and benefits of the procedure and the sedation                            options and risks were discussed with the patient.                            All questions were answered, and informed consent                            was obtained. Prior Anticoagulants: The patient                            last took Eliquis (apixaban) 3 days prior to the                            procedure. ASA Grade Assessment: III - A patient  with severe systemic disease. After reviewing the                            risks and benefits, the patient was deemed in                            satisfactory condition to undergo the procedure.                           After obtaining informed consent, the colonoscope                            was passed under direct vision. Throughout the                            procedure, the  patient's blood pressure, pulse, and                            oxygen saturations were monitored continuously. The                            [id number]) scope was introduced through the                            anus and advanced to the the cecum, identified by                            appendiceal orifice and ileocecal valve. The                            colonoscopy was performed without difficulty. The                            patient tolerated the procedure well. The quality                            of the bowel preparation was adequate. Scope In: 11:00:34 AM Scope Out: 11:17:57 AM Scope Withdrawal Time: 0 hours 7 minutes 9 seconds  Total Procedure Duration: 0 hours 17 minutes 23 seconds  Findings:      The perianal and digital rectal examinations were normal.      Three semi-pedunculated polyps were found in the hepatic flexure. The       polyps were 5 to 6 mm in size. These polyps were removed with a cold       snare. Resection and retrieval were complete. Estimated blood loss was       minimal.      A 10 mm polyp was found in the hepatic flexure. The polyp was       semi-pedunculated. The polyp was removed with a hot snare. Resection and       retrieval were complete. Estimated blood loss: none.      Scattered small and large-mouthed diverticula were found in the entire       colon. 1.5 cm pale yellowish submucosal nodule in the descending       segment. Positive pillow  sign.      The exam was otherwise without abnormality on direct and retroflexion       views. Impression:               - Three 5 to 6 mm polyps at the hepatic flexure,                            removed with a cold snare. Resected and retrieved.                           - One 10 mm polyp at the hepatic flexure, removed                            with a hot snare. Resected and retrieved.                           - Diverticulosis in the entire examined colon.                            Descending  colon lipoma.                           - The examination was otherwise normal on direct                            and retroflexion views. Moderate Sedation:      Moderate (conscious) sedation was personally administered by an       anesthesia professional. The following parameters were monitored: oxygen       saturation, heart rate, blood pressure, respiratory rate, EKG, adequacy       of pulmonary ventilation, and response to care. Recommendation:           - Patient has a contact number available for                            emergencies. The signs and symptoms of potential                            delayed complications were discussed with the                            patient. Return to normal activities tomorrow.                            Written discharge instructions were provided to the                            patient.                           - Advance diet as tolerated.                           - Continue present medications.                           -  Repeat colonoscopy date to be determined after                            pending pathology results are reviewed for                            surveillance.                           - Return to GI office (date not yet determined).                            Resume Eliquis September 1. Procedure Code(s):        --- Professional ---                           [phone], Colonoscopy, flexible; with removal of                            tumor(s), polyp(s), or other lesion(s) by snare                            technique Diagnosis Code(s):        --- Professional ---                           K63.5, Polyp of colon                           Z86.010, Personal history of colonic polyps                           K57.30, Diverticulosis of large intestine without                            perforation or abscess without bleeding CPT copyright 2019 [employer]. All rights reserved. The codes documented in this  report are preliminary and upon coder review may  be revised to meet current compliance requirements. Cristopher Estimable. Dorine Duffey, MD Norvel Richards, MD 03/13/2022 11:26:47 AM This report has been signed electronically. Number of Addenda: 0

## 2022-03-14 LAB — SURGICAL PATHOLOGY

## 2022-03-14 NOTE — Anesthesia Postprocedure Evaluation (Signed)
Anesthesia Post Note  Patient: Casey [name]  Procedure(s) Performed: COLONOSCOPY WITH PROPOFOL POLYPECTOMY  Patient location during evaluation: Phase II Anesthesia Type: General Level of consciousness: awake Pain management: pain level controlled Vital Signs Assessment: post-procedure vital signs reviewed and stable Respiratory status: spontaneous breathing and respiratory function stable Cardiovascular status: blood pressure returned to baseline and stable Postop Assessment: no headache and no apparent nausea or vomiting Anesthetic complications: no Comments: Late entry   No notable events documented.   Last Vitals:  Vitals:   03/13/22 1123 03/13/22 1124  BP: (!) 101/57 (!) 151/69  Pulse: (!) 58   Resp: 12 17  Temp: (!) 36.4 C   SpO2: 97% 99%    Last Pain:  Vitals:   03/14/22 0919  TempSrc:   PainSc: 0-No pain                 Louann Sjogren

## 2022-03-19 ENCOUNTER — Telehealth: Payer: Self-pay | Admitting: Psychiatry

## 2022-03-19 ENCOUNTER — Other Ambulatory Visit: Payer: Self-pay

## 2022-03-19 MED ORDER — OLANZAPINE 5 MG PO TABS: 5.0000 mg | ORAL_TABLET | Freq: Every day | ORAL | 0 refills | Status: DC

## 2022-03-19 NOTE — Telephone Encounter (Signed)
Wife, [name] called in at 11:30am. She said [name] is on Olanzapine 7.'5mg'$ . She feels it may be too strong for him because he sleeps 14-15 hours per day. Can they reduce it.?

## 2022-03-19 NOTE — Telephone Encounter (Signed)
Casey [name] stated she discussed this with you before.She was not sure if you wanted to see him sooner or if you want to make an adjustment over the phone

## 2022-03-19 NOTE — Telephone Encounter (Signed)
Olanzapine is his only mood stabilizer at present.  There is a risk that he could get manic again with the dosage reduction.  But we can try reducing from 7.5 to 5 mg at night.  That may or may not help with his tendency to sleep excessively.  Some people will do that even on low doses of olanzapine.  Please send in prescription for olanzapine 5 mg tablets 1 nightly #30, 1 additional refill and make a note in the pharmacy section of the order to cancel any other olanzapine refills.

## 2022-03-19 NOTE — Telephone Encounter (Signed)
LVM with info,rx sent

## 2022-03-20 ENCOUNTER — Encounter (HOSPITAL_COMMUNITY): Payer: Self-pay | Admitting: Internal Medicine

## 2022-03-21 ENCOUNTER — Encounter: Payer: Self-pay | Admitting: Internal Medicine

## 2022-04-11 ENCOUNTER — Other Ambulatory Visit: Payer: Self-pay | Admitting: Psychiatry

## 2022-05-08 ENCOUNTER — Other Ambulatory Visit: Payer: Self-pay | Admitting: Cardiovascular Disease

## 2022-06-04 ENCOUNTER — Encounter: Payer: Self-pay | Admitting: Psychiatry

## 2022-06-04 ENCOUNTER — Ambulatory Visit: Payer: Medicare Other | Admitting: Psychiatry

## 2022-06-04 DIAGNOSIS — G2581 Restless legs syndrome: Secondary | ICD-10-CM

## 2022-06-04 DIAGNOSIS — G251 Drug-induced tremor: Secondary | ICD-10-CM | POA: Diagnosis not present

## 2022-06-04 DIAGNOSIS — F3110 Bipolar disorder, current episode manic without psychotic features, unspecified: Secondary | ICD-10-CM | POA: Diagnosis not present

## 2022-06-04 DIAGNOSIS — F411 Generalized anxiety disorder: Secondary | ICD-10-CM

## 2022-06-04 DIAGNOSIS — F5105 Insomnia due to other mental disorder: Secondary | ICD-10-CM

## 2022-06-04 DIAGNOSIS — Z8669 Personal history of other diseases of the nervous system and sense organs: Secondary | ICD-10-CM

## 2022-06-04 MED ORDER — OLANZAPINE 2.5 MG PO TABS: 2.5000 mg | ORAL_TABLET | Freq: Every day | ORAL | 1 refills | Status: DC

## 2022-06-04 NOTE — Progress Notes (Signed)
RUSSELL QUINNEY [id number] [birth year] 76 y.o.    Subjective:   Patient ID:  Casey [name] is a 75 y.o. (DOB 1945/09/27) male.  Chief Complaint:  Chief Complaint  Patient presents with   Follow-up    Bipolar I disorder, most recent episode (or current) manic ([place])   Anxiety    HPI seen with wife Sanders Manninen presents for follow-up of bipolar disorder and anxiety and history of confusion  At visit  Dec 02, 2018.  He was having confusion and benztropine was stopped to see if that was the cause.  Also it was having balance issues and therefore Trileptal was switched all to nighttime 1200 mg nightly. Confusion much better per both of them with DC benztropine.  Balance better with change Trileptal.   visit was January 13, 2019 and he was doing well as noted above and he was encouraged not to make further med changes at that time.  visit April 13, 2019.  He was doing better with regard to depression than he had in quite some time on the Wellbutrin and no meds were changed.  He was having health problems with pulmonary effusion and fluid removed twice related to fall in January 2020.  seen September 13, 2019.  For residual depression the following decisions were made: Balance problem much improved with dosing Trileptal all in the evening. Depression under partial control & wife wants it to be better per his wife.  No mood swings.   Increase bupropion XL to 2 of the 150 mg tablets in the morning until the current bottle is gone. Then new RX will be 1 of the 300 mg tablets each morning.  November 08, 2019 appointment the following is noted:  Seen with wife, [name]. Small amount of improvement with change.  Anxiety got worse.  Notices when he goes out.  Worries about social and political things.  Wife keeps him active at home.  Allerigies bother him..  No sig caffeine except tea when goes out.  Wife sees him as a little more active and initiative.  More productive.  Started some cooking.   Getting out and up better.  A little more talkative.   "He wants to be hyper but we know what happens". Wife [name] thinks   But for the last 2 years he's done nothing and laid around all the time.  He's a lot more active now. Big improvement. Went to church first time in 2 years. Plan:  For TRD, Pramipexole 0.25 mg tablet 1/2 twice daily for 1 week then 1 twice daily.  12/20/2019 appointment with the following noted:  Seen with wife. A little better motivated to get out but not to get in crowds.  Wife agrees more motivated and went to church once alone without pressure.  Wife says he's more talkative and much  Better interest.  Still not motivated to do things like mow the grass.  Others's have seen a difference. Concentration and memory are improved but not normal. No Se pramipexole.  Getting out of bed earlier and better now.. Wife admisters meds bc earlier he messsed them up. Plan: Wife has seen a big improvement therefore Partial improvement is clear from this so increase Pramipexole 0.375 mg tablet  twice daily  Anxiety is not fully managed.  Partly related to depression. Tremor is manageable.    02/15/20 appt with the following noted:  Send with wife [name] Doing good.  Increased pramipexole as indicated.  Tolerating meds Wellbutrin XL 300  mg AM & Trileptal 1200 mg HS with it. Much better with depression.  Stays up more and speinding less time in bed.   Wife agrees he's much better and handling things better. 2 1/2 yo 4th cousin girl drowned in pond lately.  Big family. Wife pleased he handled it better. SE constipation ? Related. History Linzess but diarrhea. He's not highly motivated but it is better.  Depression is much improved.  Had been depressed for years. Plan no med changes  06/13/2020 appointment with the following noted: Pretty good but cardiologist noted today increased pulse needing treatment.  Uncontrolled afib even after ablation. Hard to tell about depression bc of fatigue  Dt heart problems. Wife said he did well Thanksgiving and participated more in conversation per wife. Plan: no med changes  09/06/2020 appointment with following noted: Remains on Wellbutrin XL 300 mg every morning, ginkgo biloba 40 mg twice daily, oxcarbazepine 1200 mg nightly, pramipexole 0.75 mg twice daily. Less sig health concerns. He thinks mood has been pretty good.  Consistent with meds. Wife thinks he stays tired and sleepy and wonders if can give him a stimulant.   Sleep good.  Will nap.  Doesn't bother him.  Walks dog 20 min in AM.  Not much initiative per wife.  Not depressed but when depressed he stayed in the bed lasting a couple of years.  History of OSA but lost weight and doesn't think he has apnea anymore.  No CPAP. Chronic afib. Not much caffeine. Plan: Wife& patient has seen a big improvement with Pramipexole 0.375 mg tablet  twice daily They ask consideration be given to the following: If his energy gets better by treating his tachycardia they would like to consider reducing the pramipexole or possibly eliminating it at follow-up.   For energy trial reduction pramipexole to 0.25 mg BID.  If gets more depressed call. Alternative of modafinil 100  12/04/2020 appointment with the following noted: Energy is better with awakening earlier at 22 and doing more and getting out more.  Going to bed later with 8 hour  Sleep. Mood fluctuates with more reacting to wife mainly.  More outspoken.  Per W he has to vent.  She thinks he is too focused on politics and issues.   Plan: Depression under control.  No mood swings.   Continue bupropion XL 300 mg tablets each morning. Continue Trileptal 600 mg 2 at HS. No higher DT anxiety risk and no traditional stimulants DT afib.  Wife & patient has seen a big improvement with Pramipexole and able to reduce to  0.25 mg tablet  twice daily  02/20/2021 appointment with the following noted:  seen with wife Prednisone made him real hyper.  Been off  of it for at least a couple weeks.  Hyperverbal and hyperactive.  Hip pain interferes with sleep.  Sees doctor tomorrow. Average 3-4 hours at night and no naps bc busy.  No excessive spending but more than usual.  Denies appetite disturbance.  Patient reports that energy and motivation have been reduced.  Patient has difficulty with concentration and memory.  Patient denies any suicidal ideation.  W had MI in June. A/P: mild mania Wean Wellbutrin over 2 weeks. Continue Trileptal 600 mg 2 at HS. Wife & patient has seen a big improvement with Pramipexole and able to reduce to  0.25 mg tablet  twice daily. Continue it unless mania does not resolve with less bupropion.  03/26/2021 phone call: [name] stated [place] has slightly improved since last  week.He has not been able to sleep and was up at 2 am texting people in a disoriented state.His pcp put him on Ambien 10 mg and this has helped him sleep.However,linda stated he is still working him self too much.She said he is jittery and has to have something to do at all times.He does not want to slow down or get rest.She thinks he needs to cutback on the Mirapex as well. MD response:  I agree with his wife about reducing Mirapex from 1 tablet twice a day to half tablet twice a day for 1 week and if his symptoms are not resolved and stop the Mirapex.  04/18/2021 phone call with wife and MD: Patient is still manic after stopping Wellbutrin and pramipexole.  Wife's concern is hard to get him to settle down at night.  Reviewed with wife prior psychiatric medications which are multiple.  Typically one would use an antipsychotic in this case but she does not think that they worked in the past.  She says he has been on higher doses of Trileptal and tolerated it. Increase Trileptal to 600 mg every morning and 1200 mg nightly for mania.  Keep scheduled appointment.  05/02/2021 wife reports patient still manic: We cannot go higher in the Trileptal because of balance issues.   Therefore olanzapine 10 mg nightly was started and we discussed the option of hospitalization.  We will try to get the patient in urgently to an appointment.  05/07/2021 appointment with the following noted: seen with wife, [name] Not doing too well.  Trouble getting out of bed.  Wife says he's still up in middle of the night but he says he's sleeping better.  Sleep in recliner downstairs. She says he's still manic with hyperactive, hyper graphia but won't show her what he's writing. On olanzapine  No changes off Wellbutrin and pramipexole. Apparently more balance problems with increase Trileptal to 1800 mg daily.  Dropped back to 1200 mg HS. Admits to some confusion.  Has pulled off labels on bottle per wife. She thinks he's averaging 4-6 hours of sleep and a little better than it was. Wife said he started cussing and he has not done that in the past. Other episodes of inappropriate decision making.  He recognizes he's manic but can't control himself.  Hyperverbal, pressured.  In past had speech problems with mania also.  Neg neuro work up for stroke in the past. SE balance and tremor. Some trouble getting out of chair? Plan: For mania,Increase olanzapine to 15 mg PM Follow-up Friday for urgent visit  05/11/2021 appointment with the following noted: Calming down a little. He thinks he's sleeping better.  She says he needs whole Ambien with olanzapine and last night still not sleeping.  He woke her with complaints of back pain. She's not seeing SE and nor is he.  She's not seeing much change  He says back pain is complicating his sleep and his problem. He tries to stay active.   She says he talks incessantly but not as much to her. She says major life events have triggered mania in him in the past. She notices him making poor decisions. Plan: Increase olanzapine to 20 mg PM  05/24/2021 appointment with the following noted: with D Nevin Bloodgood A week ago went of to doctor's office alone and wife  called bc he was agitated. Per wife still going wide open but is sleeping better. Yesterday had fever and talking out of his head and dx UTI. He still feels  driven to do chores but distractible and inefficient.   D does not see improvement and nor does wife.   7 hour sleep lately. Plan: Increase olanzapine to 30 mg in evening Reduce oxcarbazepine to 1 and 1/2 tablets in the evening.  06/04/2021 appointment: Seen with wife Sleeping more 7-9 hours.   Per wife still some confusion and still short tempered.  Less pressured sleep. He won't go to bed when sleepy and waits too long to get in bed and is weak and cannot do it by himself. Wife agrees he's less "wide open" than before and will be able to sit and watch TV show now. B died yesterday. Plan: Continue olanzapine to 30 mg in evening Reduce oxcarbazepine  again to 1 tablets in the evening for 1 week, then 1/2 tablet at night for a week then stop it.  07/04/21 appt noted:  seen with wife [name] Hyper-ness is better.  Some trouble with concentration but able to make peanut butter fudge. Problems with afib.   Sleeping a lot and some instability.  Goes to sleep in chair.  Better with irritability.  No longer talking constantly but still some in spells.   Not depressed.   No change in balance off oxcarbazepine. Wife said last week mixed up meds and got oversedated.  She discovered the option. Plan: Reduce olanzapine to 20 mg PM now that mania is largely resolved.  08/06/2021 appointment with the following noted: Only psych med is reduced olanzapine 20 mg HS RLS and fidgety.  All day.  Can sit for an hour.  Uncomfortable when sitting. Stopped Ginko and wife noticed RLS more.  She restarted it.  No longer rocking back and forth. Caffeine tea at lunch.  No coffee..  [place] some. RLS recently started. Bipolar sx great per wife.  Not wanting to go out much. CO tiredness.  Falls asleep in recliner.  Sleep all night. Eats sweets all his  life. Plan: No longer manic. Reduce olanzapine to 15 mg earlier in PM Stop caffeine  09/06/2021 appointment with the following noted: Reduced olanzapine 15 mg and still RLS all the day. No change in that.  But can sit in church ok.  Will move legs when standing and talking to people. Sleeping more than he should from 10 until 9. And still naps and little initiative bc tired. Not depressed but not back to normal.  Not as social as usual. Loves to eat out. Plan: Reduce olanzapine from 15 to 7.5 mg earlier in PM Stop caffeine  10/02/2021 appointment with the following noted: seen again with wife [name] Only psych med is the reduced dose of olanzapine 7.5 mg for 30 days. Back pain limits activity and getting shots. Taking tramadol for pain.  1-2 prn.  One helps. 2 is sedating. RLS is better and mainly now the problem is back pain.  Has to sleep in recliner DT pain.   [name] says hard to tell about effect from reduced olanzapine bc he's having such back pain and taking tramadol.  Doesn't feel like doing anything. Plan continue olanzapine 7.5 mg every afternoon  12/04/2021 appointment with the following noted: seen with wife Still doing well with olanzapine 7.5 mg PM No SE.  RLS is OK now. Sleep good 8-10 hours. Not depressed nor manic sx.   Happy with meds.   Wife gall bladder surgery. H helping wife. Plan: olanzapine 7.5 mg earlier in PM Stopcaffeine   03/19/2022 phone call: Wife called complaining that her husband sleeping 14  to 15 hours a day on olanzapine 7.5 mg daily and wondering about trying to reduce it. MD response: Olanzapine is his only mood stabilizer at present.  There is a risk that he could get manic again with the dosage reduction.  But we can try reducing from 7.5 to 5 mg at night.  That may or may not help with his tendency to sleep excessively.  Some people will do that even on low doses of olanzapine.  Please send in prescription for olanzapine 5 mg tablets 1 nightly #30, 1  additional refill and make a note in the pharmacy section of the order to cancel any other olanzapine refills.     06/04/2022 appointment noted:  wife says a tad better with less olanzapine but still wants to sleep and sit around a lot. Keeps saying I'm not depressed.   Reduced olanzapine 5 mg HS. No mania or depression. Hx sleep study and dx OSA but per wife got better with wt loss.  Not using CPAP. Eating less but wt loss stable. Asks about seasonal pattern bc of the history.  She can't remember the seasonal pattern.    Past Psychiatric Medication Trials: Seroquel 300,  olanzapine 30 resolved mania,  (Improved  with Increase olanzapine to 30 mg PM and off the oxcarbazepine.) perphenazine, risperidone,  Latuda,  Abilify, Lithium did great helped but SE afib, kidneys and prostate,    alprazolam,  Depakote, Trileptal, carbamazepine, lamotrigine caused nausea,  citalopram, sertraline 150,   Wellbutrin 300 anxiety,  Pramipexole 0.375 BID Benztropine confusion        1 prior psych hosp depression                                                    Review of Systems:  Review of Systems  Constitutional:  Positive for fatigue.  HENT:         Clicking teeth  Eyes:  Negative for visual disturbance.  Respiratory:  Negative for shortness of breath.   Gastrointestinal:  Positive for constipation.  Musculoskeletal:  Positive for arthralgias, back pain and gait problem.  Skin:  Negative for rash.       chronic  Neurological:  Positive for tremors and weakness.       Balance px longterm no worse RLS day and PM  Psychiatric/Behavioral:  Negative for agitation, behavioral problems, confusion, decreased concentration, dysphoric mood, hallucinations, self-injury, sleep disturbance and suicidal ideas. The patient is not nervous/anxious and is not hyperactive.     Medications: I have reviewed the patient's current medications.  Current Outpatient Medications  Medication Sig Dispense Refill    acetaminophen (TYLENOL) 500 MG tablet Take 1,000 mg by mouth every 6 (six) hours as needed for headache.     albuterol (VENTOLIN HFA) 108 (90 Base) MCG/ACT inhaler Inhale 1-2 puffs into the lungs every 6 (six) hours as needed for wheezing or shortness of breath.     cholecalciferol (VITAMIN D) 1000 UNITS tablet Take 2,000 Units by mouth daily.     furosemide (LASIX) 20 MG tablet Take 20 mg by mouth daily as needed for edema.     levothyroxine (SYNTHROID, LEVOTHROID) 25 MCG tablet Take 25 mcg by mouth daily before breakfast.     metoprolol succinate (TOPROL-XL) 25 MG 24 hr tablet Take 25 mg by mouth daily.     pantoprazole (PROTONIX)  40 MG tablet TAKE ONE TABLET BY MOUTH ONCE DAILY 30 tablet 11   rivaroxaban (XARELTO) 20 MG TABS tablet Take 20 mg by mouth daily after supper.      SUPER B COMPLEX/C PO Take 1 tablet by mouth daily.     OLANZapine (ZYPREXA) 2.5 MG tablet Take 1 tablet (2.5 mg total) by mouth at bedtime. 30 tablet 1   No current facility-administered medications for this visit.    Medication Side Effects: resolved  Allergies:  Allergies  Allergen Reactions   Amlodipine Other (See Comments)    Stopped due to kidney issues   Vicodin [Hydrocodone-Acetaminophen] Other (See Comments)    Patient is unsure of reaction    Past Medical History:  Diagnosis Date   Arthritis    Asthma    Atrial fibrillation ([place]) 01/12/2010   2D Echo EF=>55%   Bipolar disorder ([facility])    Chronic back pain    Depression    Dysrhythmia    AFib   GERD (gastroesophageal reflux disease)    History of colonic polyps    History of gout    Hyperlipidemia    Hypertension    Hypothyroidism    Morbid obesity ([facility])    OSA (obstructive sleep apnea)    AHI was 27.62hr RDI was 34.3 hr REM 0.00hr; had CPAP years ago but no longer uses.   Prostatitis    Tubular adenoma of colon 01/2016    Family History  Problem Relation Age of Onset   Diabetes Mother    Heart failure Mother    Hypertension Mother     Hypertension Father    Cancer Paternal Grandmother    Stroke Sister    Liver disease Brother    Vascular Disease Brother    Arthritis Brother    Colon cancer Neg Hx     Social History   Socioeconomic History   Marital status: Married    Spouse name: Not on file   Number of children: Not on file   Years of education: Not on file   Highest education level: Not on file  Occupational History   Not on file  Tobacco Use   Smoking status: Never   Smokeless tobacco: Never  Substance and Sexual Activity   Alcohol use: No    Alcohol/week: 0.0 standard drinks of alcohol   Drug use: No   Sexual activity: Yes    Birth control/protection: None  Other Topics Concern   Not on file  Social History Narrative   Not on file   Social Determinants of Health   Financial Resource Strain: Not on file  Food Insecurity: Not on file  Transportation Needs: Not on file  Physical Activity: Not on file  Stress: Not on file  Social Connections: Not on file  Intimate Partner Violence: Not on file    Past Medical History, Surgical history, Social history, and Family history were reviewed and updated as appropriate.   Please see review of systems for further details on the patient's review from today.   Objective:   Physical Exam:  There were no vitals taken for this visit.  Physical Exam Constitutional:      General: He is not in acute distress. Musculoskeletal:        General: No deformity.  Neurological:     Mental Status: He is alert and oriented to person, place, and time.     Coordination: Coordination normal.     Gait: Gait normal.     Comments: Mild rocking motions  legs resolved Mild lip licking and buccal movements  Psychiatric:        Attention and Perception: He is attentive. He does not perceive auditory hallucinations.        Mood and Affect: Mood is not anxious or depressed. Affect is not labile, blunt, angry or inappropriate.        Speech: Speech is not rapid and  pressured or tangential.        Behavior: Behavior normal. Behavior is not agitated.        Thought Content: Thought content normal. Thought content is not delusional. Thought content does not include homicidal or suicidal ideation. Thought content does not include suicidal plan.        Cognition and Memory: Cognition normal.        Judgment: Judgment normal.     Comments: Insight intact. No auditory or visual hallucinations. No delusions.  Mania resolved Gait is a little slow but not marked.     Lab Review:     Component Value Date/Time   NA 141 03/11/2022 1128   NA 141 12/28/2018 0941   K 3.6 03/11/2022 1128   CL 110 03/11/2022 1128   CO2 25 03/11/2022 1128   GLUCOSE 91 03/11/2022 1128   BUN 18 03/11/2022 1128   BUN 16 12/28/2018 0941   CREATININE 1.18 03/11/2022 1128   CALCIUM 9.0 03/11/2022 1128   PROT 6.6 12/28/2018 0941   ALBUMIN 4.5 12/28/2018 0941   AST 21 12/28/2018 0941   ALT 22 12/28/2018 0941   ALKPHOS 115 12/28/2018 0941   BILITOT 0.7 12/28/2018 0941   GFRNONAA >60 03/11/2022 1128   GFRAA 63 12/28/2018 0941       Component Value Date/Time   WBC 8.1 12/28/2018 0941   WBC 5.4 06/30/2018 1311   RBC 4.81 12/28/2018 0941   RBC 4.52 06/30/2018 1311   HGB 15.2 12/28/2018 0941   HCT 42.9 12/28/2018 0941   PLT 186 12/28/2018 0941   MCV 89 12/28/2018 0941   MCH 31.6 12/28/2018 0941   MCH 31.9 06/30/2018 1311   MCHC 35.4 12/28/2018 0941   MCHC 32.8 06/30/2018 1311   RDW 12.9 12/28/2018 0941   LYMPHSABS 1.1 12/28/2018 0941   MONOABS 0.5 06/30/2018 1311   EOSABS 0.1 12/28/2018 0941   BASOSABS 0.0 12/28/2018 0941    No results found for: "POCLITH", "LITHIUM"   No results found for: "PHENYTOIN", "PHENOBARB", "VALPROATE", "CBMZ"   .res Assessment: Plan:    Calvin was seen today for follow-up and anxiety.  Diagnoses and all orders for this visit:  Bipolar I disorder, most recent episode (or current) manic ([place]) -     OLANZapine (ZYPREXA) 2.5 MG tablet;  Take 1 tablet (2.5 mg total) by mouth at bedtime.  Generalized anxiety disorder -     OLANZapine (ZYPREXA) 2.5 MG tablet; Take 1 tablet (2.5 mg total) by mouth at bedtime.  Secondary restless legs syndrome  Tremor due to multiple drugs  Insomnia due to mental condition  History of obstructive sleep apnea   History pseudodementia with depression which is resolved.  Greater than 50% of 30 min face to face time with patient was spent on counseling and coordination of care. We discussed Dx bipolar at 76 yo.  Bipolar depression has flipped into mania from prednisone.  Patient is very complicated because of weakness and medical problems.   Confusion resolved.   Pt is no longer manic and is not depressed.. no problem with less olanzapine but still sleeping too much  and too sedate.  Hx seasonal changes so cautioned about mania in the spring.  Prednisone triggered some mania in recently  and persisted off prednisone.   Hopefully the shots won't trigger problems.  Disc risk mania with lower dose.  No RLS  and sleeping too much still  RLS better with less olanzapine.  Stop caffeine.  No longer manic. Disc dosing range.  Disc risk of getting it so low he gets manic. Reduce  DT sleepiness olanzapine 2.5 mg earlier in PM Mild TD   If still a problem with sleepiness DT multiple med failures consider modafinil to manage.  Stop caffeine  Fall precautions.  Follow-up 2 mos  Lynder Parents, MD, DFAPA   Please see After Visit Summary for patient specific instructions.  Future Appointments  Date Time Provider [place]  08/21/2022 11:20 AM Croitoru, Dani Gobble, MD [facility] None     No orders of the defined types were placed in this encounter.      -------------------------------

## 2022-06-27 DIAGNOSIS — N183 Chronic kidney disease, stage 3 unspecified: Secondary | ICD-10-CM | POA: Diagnosis not present

## 2022-06-27 DIAGNOSIS — I129 Hypertensive chronic kidney disease with stage 1 through stage 4 chronic kidney disease, or unspecified chronic kidney disease: Secondary | ICD-10-CM | POA: Diagnosis not present

## 2022-06-27 DIAGNOSIS — F319 Bipolar disorder, unspecified: Secondary | ICD-10-CM | POA: Diagnosis not present

## 2022-06-27 DIAGNOSIS — E119 Type 2 diabetes mellitus without complications: Secondary | ICD-10-CM | POA: Diagnosis not present

## 2022-06-27 DIAGNOSIS — E039 Hypothyroidism, unspecified: Secondary | ICD-10-CM | POA: Diagnosis not present

## 2022-06-27 DIAGNOSIS — D6949 Other primary thrombocytopenia: Secondary | ICD-10-CM | POA: Diagnosis not present

## 2022-06-27 DIAGNOSIS — Z0001 Encounter for general adult medical examination with abnormal findings: Secondary | ICD-10-CM | POA: Diagnosis not present

## 2022-07-29 ENCOUNTER — Telehealth: Payer: Self-pay | Admitting: Psychiatry

## 2022-07-29 NOTE — Telephone Encounter (Signed)
Patient's wife(Linda) lvm at 4:20 regarding Edwen's medication. States that he was on olazanapine 2.'5mg'$  and that he not showing any changes. They went back to '5mg'$  instead. Pls call [phone]

## 2022-07-29 NOTE — Telephone Encounter (Signed)
Olanzapine is his only mood stabilizer.  Therefore since side effects are not any better at 2.5 mg nightly we will continue the 5 mg nightly

## 2022-07-29 NOTE — Telephone Encounter (Signed)
Spoke to Casey [name] and she stated she was told to call so that we can document that he is going back on olanzapine 5 mg starting tonight.

## 2022-08-05 ENCOUNTER — Encounter: Payer: Self-pay | Admitting: Cardiovascular Disease

## 2022-08-05 NOTE — Telephone Encounter (Signed)
Error

## 2022-08-14 ENCOUNTER — Encounter: Payer: Self-pay | Admitting: Psychiatry

## 2022-08-14 ENCOUNTER — Ambulatory Visit (INDEPENDENT_AMBULATORY_CARE_PROVIDER_SITE_OTHER): Payer: Medicare Other | Admitting: Psychiatry

## 2022-08-14 DIAGNOSIS — G2581 Restless legs syndrome: Secondary | ICD-10-CM

## 2022-08-14 DIAGNOSIS — F5105 Insomnia due to other mental disorder: Secondary | ICD-10-CM | POA: Diagnosis not present

## 2022-08-14 DIAGNOSIS — F411 Generalized anxiety disorder: Secondary | ICD-10-CM

## 2022-08-14 DIAGNOSIS — F3110 Bipolar disorder, current episode manic without psychotic features, unspecified: Secondary | ICD-10-CM

## 2022-08-14 DIAGNOSIS — F3131 Bipolar disorder, current episode depressed, mild: Secondary | ICD-10-CM

## 2022-08-14 MED ORDER — BUPROPION HCL ER (XL) 150 MG PO TB24: ORAL_TABLET | ORAL | 0 refills | Status: DC

## 2022-08-14 MED ORDER — OLANZAPINE 5 MG PO TABS: 5.0000 mg | ORAL_TABLET | Freq: Every day | ORAL | 1 refills | Status: DC

## 2022-08-14 NOTE — Progress Notes (Signed)
Casey [name] [id number] Dec 27, 1945 77 y.o.    Subjective:   Patient ID:  Casey [name] is a 77 y.o. (DOB 10-25-1945) male.  Chief Complaint:  Chief Complaint  Patient presents with   Follow-up    Bipolar I disorder, most recent episode (or current) manic ([place])   Anxiety    HPI seen with wife Carvin Almas presents for follow-up of bipolar disorder and anxiety and history of confusion  At visit  Dec 02, 2018.  He was having confusion and benztropine was stopped to see if that was the cause.  Also it was having balance issues and therefore Trileptal was switched all to nighttime 1200 mg nightly. Confusion much better per both of them with DC benztropine.  Balance better with change Trileptal.   visit was January 13, 2019 and he was doing well as noted above and he was encouraged not to make further med changes at that time.  visit April 13, 2019.  He was doing better with regard to depression than he had in quite some time on the Wellbutrin and no meds were changed.  He was having health problems with pulmonary effusion and fluid removed twice related to fall in January 2020.  seen September 13, 2019.  For residual depression the following decisions were made: Balance problem much improved with dosing Trileptal all in the evening. Depression under partial control & wife wants it to be better per his wife.  No mood swings.   Increase bupropion XL to 2 of the 150 mg tablets in the morning until the current bottle is gone. Then new RX will be 1 of the 300 mg tablets each morning.  November 08, 2019 appointment the following is noted:  Seen with wife, [name]. Small amount of improvement with change.  Anxiety got worse.  Notices when he goes out.  Worries about social and political things.  Wife keeps him active at home.  Allerigies bother him..  No sig caffeine except tea when goes out.  Wife sees him as a little more active and initiative.  More productive.  Started some cooking.   Getting out and up better.  A little more talkative.   "He wants to be hyper but we know what happens". Wife [name] thinks   But for the last 2 years he's done nothing and laid around all the time.  He's a lot more active now. Big improvement. Went to church first time in 2 years. Plan:  For TRD, Pramipexole 0.25 mg tablet 1/2 twice daily for 1 week then 1 twice daily.  12/20/2019 appointment with the following noted:  Seen with wife. A little better motivated to get out but not to get in crowds.  Wife agrees more motivated and went to church once alone without pressure.  Wife says he's more talkative and much  Better interest.  Still not motivated to do things like mow the grass.  Others's have seen a difference. Concentration and memory are improved but not normal. No Se pramipexole.  Getting out of bed earlier and better now.. Wife admisters meds bc earlier he messsed them up. Plan: Wife has seen a big improvement therefore Partial improvement is clear from this so increase Pramipexole 0.375 mg tablet  twice daily  Anxiety is not fully managed.  Partly related to depression. Tremor is manageable.    02/15/20 appt with the following noted:  Send with wife [name] Doing good.  Increased pramipexole as indicated.  Tolerating meds Wellbutrin XL 300  mg AM & Trileptal 1200 mg HS with it. Much better with depression.  Stays up more and speinding less time in bed.   Wife agrees he's much better and handling things better. 2 1/2 yo 4th cousin girl drowned in pond lately.  Big family. Wife pleased he handled it better. SE constipation ? Related. History Linzess but diarrhea. He's not highly motivated but it is better.  Depression is much improved.  Had been depressed for years. Plan no med changes  06/13/2020 appointment with the following noted: Pretty good but cardiologist noted today increased pulse needing treatment.  Uncontrolled afib even after ablation. Hard to tell about depression bc of fatigue  Dt heart problems. Wife said he did well Thanksgiving and participated more in conversation per wife. Plan: no med changes  09/06/2020 appointment with following noted: Remains on Wellbutrin XL 300 mg every morning, ginkgo biloba 40 mg twice daily, oxcarbazepine 1200 mg nightly, pramipexole 0.75 mg twice daily. Less sig health concerns. He thinks mood has been pretty good.  Consistent with meds. Wife thinks he stays tired and sleepy and wonders if can give him a stimulant.   Sleep good.  Will nap.  Doesn't bother him.  Walks dog 20 min in AM.  Not much initiative per wife.  Not depressed but when depressed he stayed in the bed lasting a couple of years.  History of OSA but lost weight and doesn't think he has apnea anymore.  No CPAP. Chronic afib. Not much caffeine. Plan: Wife& patient has seen a big improvement with Pramipexole 0.375 mg tablet  twice daily They ask consideration be given to the following: If his energy gets better by treating his tachycardia they would like to consider reducing the pramipexole or possibly eliminating it at follow-up.   For energy trial reduction pramipexole to 0.25 mg BID.  If gets more depressed call. Alternative of modafinil 100  12/04/2020 appointment with the following noted: Energy is better with awakening earlier at 60 and doing more and getting out more.  Going to bed later with 8 hour  Sleep. Mood fluctuates with more reacting to wife mainly.  More outspoken.  Per W he has to vent.  She thinks he is too focused on politics and issues.   Plan: Depression under control.  No mood swings.   Continue bupropion XL 300 mg tablets each morning. Continue Trileptal 600 mg 2 at HS. No higher DT anxiety risk and no traditional stimulants DT afib.  Wife & patient has seen a big improvement with Pramipexole and able to reduce to  0.25 mg tablet  twice daily  02/20/2021 appointment with the following noted:  seen with wife Prednisone made him real hyper.  Been off  of it for at least a couple weeks.  Hyperverbal and hyperactive.  Hip pain interferes with sleep.  Sees doctor tomorrow. Average 3-4 hours at night and no naps bc busy.  No excessive spending but more than usual.  Denies appetite disturbance.  Patient reports that energy and motivation have been reduced.  Patient has difficulty with concentration and memory.  Patient denies any suicidal ideation.  W had MI in June. A/P: mild mania Wean Wellbutrin over 2 weeks. Continue Trileptal 600 mg 2 at HS. Wife & patient has seen a big improvement with Pramipexole and able to reduce to  0.25 mg tablet  twice daily. Continue it unless mania does not resolve with less bupropion.  03/26/2021 phone call: [name] stated [place] has slightly improved since last  week.He has not been able to sleep and was up at 2 am texting people in a disoriented state.His pcp put him on Ambien 10 mg and this has helped him sleep.However,linda stated he is still working him self too much.She said he is jittery and has to have something to do at all times.He does not want to slow down or get rest.She thinks he needs to cutback on the Mirapex as well. MD response:  I agree with his wife about reducing Mirapex from 1 tablet twice a day to half tablet twice a day for 1 week and if his symptoms are not resolved and stop the Mirapex.  04/18/2021 phone call with wife and MD: Patient is still manic after stopping Wellbutrin and pramipexole.  Wife's concern is hard to get him to settle down at night.  Reviewed with wife prior psychiatric medications which are multiple.  Typically one would use an antipsychotic in this case but she does not think that they worked in the past.  She says he has been on higher doses of Trileptal and tolerated it. Increase Trileptal to 600 mg every morning and 1200 mg nightly for mania.  Keep scheduled appointment.  05/02/2021 wife reports patient still manic: We cannot go higher in the Trileptal because of balance issues.   Therefore olanzapine 10 mg nightly was started and we discussed the option of hospitalization.  We will try to get the patient in urgently to an appointment.  05/07/2021 appointment with the following noted: seen with wife, [name] Not doing too well.  Trouble getting out of bed.  Wife says he's still up in middle of the night but he says he's sleeping better.  Sleep in recliner downstairs. She says he's still manic with hyperactive, hyper graphia but won't show her what he's writing. On olanzapine  No changes off Wellbutrin and pramipexole. Apparently more balance problems with increase Trileptal to 1800 mg daily.  Dropped back to 1200 mg HS. Admits to some confusion.  Has pulled off labels on bottle per wife. She thinks he's averaging 4-6 hours of sleep and a little better than it was. Wife said he started cussing and he has not done that in the past. Other episodes of inappropriate decision making.  He recognizes he's manic but can't control himself.  Hyperverbal, pressured.  In past had speech problems with mania also.  Neg neuro work up for stroke in the past. SE balance and tremor. Some trouble getting out of chair? Plan: For mania,Increase olanzapine to 15 mg PM Follow-up Friday for urgent visit  05/11/2021 appointment with the following noted: Calming down a little. He thinks he's sleeping better.  She says he needs whole Ambien with olanzapine and last night still not sleeping.  He woke her with complaints of back pain. She's not seeing SE and nor is he.  She's not seeing much change  He says back pain is complicating his sleep and his problem. He tries to stay active.   She says he talks incessantly but not as much to her. She says major life events have triggered mania in him in the past. She notices him making poor decisions. Plan: Increase olanzapine to 20 mg PM  05/24/2021 appointment with the following noted: with D Nevin Bloodgood A week ago went of to doctor's office alone and wife  called bc he was agitated. Per wife still going wide open but is sleeping better. Yesterday had fever and talking out of his head and dx UTI. He still feels  driven to do chores but distractible and inefficient.   D does not see improvement and nor does wife.   7 hour sleep lately. Plan: Increase olanzapine to 30 mg in evening Reduce oxcarbazepine to 1 and 1/2 tablets in the evening.  06/04/2021 appointment: Seen with wife Sleeping more 7-9 hours.   Per wife still some confusion and still short tempered.  Less pressured sleep. He won't go to bed when sleepy and waits too long to get in bed and is weak and cannot do it by himself. Wife agrees he's less "wide open" than before and will be able to sit and watch TV show now. B died yesterday. Plan: Continue olanzapine to 30 mg in evening Reduce oxcarbazepine  again to 1 tablets in the evening for 1 week, then 1/2 tablet at night for a week then stop it.  07/04/21 appt noted:  seen with wife [name] Hyper-ness is better.  Some trouble with concentration but able to make peanut butter fudge. Problems with afib.   Sleeping a lot and some instability.  Goes to sleep in chair.  Better with irritability.  No longer talking constantly but still some in spells.   Not depressed.   No change in balance off oxcarbazepine. Wife said last week mixed up meds and got oversedated.  She discovered the option. Plan: Reduce olanzapine to 20 mg PM now that mania is largely resolved.  08/06/2021 appointment with the following noted: Only psych med is reduced olanzapine 20 mg HS RLS and fidgety.  All day.  Can sit for an hour.  Uncomfortable when sitting. Stopped Ginko and wife noticed RLS more.  She restarted it.  No longer rocking back and forth. Caffeine tea at lunch.  No coffee..  [place] some. RLS recently started. Bipolar sx great per wife.  Not wanting to go out much. CO tiredness.  Falls asleep in recliner.  Sleep all night. Eats sweets all his  life. Plan: No longer manic. Reduce olanzapine to 15 mg earlier in PM Stop caffeine  09/06/2021 appointment with the following noted: Reduced olanzapine 15 mg and still RLS all the day. No change in that.  But can sit in church ok.  Will move legs when standing and talking to people. Sleeping more than he should from 10 until 9. And still naps and little initiative bc tired. Not depressed but not back to normal.  Not as social as usual. Loves to eat out. Plan: Reduce olanzapine from 15 to 7.5 mg earlier in PM Stop caffeine  10/02/2021 appointment with the following noted: seen again with wife [name] Only psych med is the reduced dose of olanzapine 7.5 mg for 30 days. Back pain limits activity and getting shots. Taking tramadol for pain.  1-2 prn.  One helps. 2 is sedating. RLS is better and mainly now the problem is back pain.  Has to sleep in recliner DT pain.   [name] says hard to tell about effect from reduced olanzapine bc he's having such back pain and taking tramadol.  Doesn't feel like doing anything. Plan continue olanzapine 7.5 mg every afternoon  12/04/2021 appointment with the following noted: seen with wife Still doing well with olanzapine 7.5 mg PM No SE.  RLS is OK now. Sleep good 8-10 hours. Not depressed nor manic sx.   Happy with meds.   Wife gall bladder surgery. H helping wife. Plan: olanzapine 7.5 mg earlier in PM Stopcaffeine   03/19/2022 phone call: Wife called complaining that her husband sleeping 14  to 15 hours a day on olanzapine 7.5 mg daily and wondering about trying to reduce it. MD response: Olanzapine is his only mood stabilizer at present.  There is a risk that he could get manic again with the dosage reduction.  But we can try reducing from 7.5 to 5 mg at night.  That may or may not help with his tendency to sleep excessively.  Some people will do that even on low doses of olanzapine.  Please send in prescription for olanzapine 5 mg tablets 1 nightly #30, 1  additional refill and make a note in the pharmacy section of the order to cancel any other olanzapine refills.     06/04/2022 appointment noted:  wife says a tad better with less olanzapine but still wants to sleep and sit around a lot. Keeps saying I'm not depressed.   Reduced olanzapine 5 mg HS. No mania or depression. Hx sleep study and dx OSA but per wife got better with wt loss.  Not using CPAP. Eating less but wt loss stable. Asks about seasonal pattern bc of the history.  She can't remember the seasonal pattern.   Plan: Reduce  DT sleepiness olanzapine 2.5 mg earlier in PM Mild TD   07/29/22 TC: no changes with reduction so increased back to 5 mg olanzapine.  08/14/22 appt noted: Still sleeping too much 12 hours.   Also been irritable with flashes.   Something can set him off. Probably getting depressed some with less olanzapine.  Some anxiety.   Some reduction in interest and not going to church like before and doesn't like to be around people.  Doesn't want wife to help others either.   No restlessness.  Will nap in the afternoon. Not enjoying things and not laughing.   Past Psychiatric Medication Trials: Seroquel 300,  olanzapine 30 resolved mania,  (Improved  with Increase olanzapine to 30 mg PM and off the oxcarbazepine.) perphenazine, risperidone,  Latuda,  Abilify, Lithium did great helped but SE afib, kidneys and prostate,    alprazolam,  Depakote, Trileptal, carbamazepine, lamotrigine caused nausea,  citalopram, sertraline 150,   Wellbutrin 300 anxiety,  Pramipexole 0.375 BID Benztropine confusion        1 prior psych hosp depression                                                    Review of Systems:  Review of Systems  Constitutional:  Positive for fatigue.  HENT:         Clicking teeth  Eyes:  Negative for visual disturbance.  Respiratory:  Negative for shortness of breath.   Gastrointestinal:  Positive for constipation.  Musculoskeletal:  Positive for  arthralgias, back pain and gait problem.  Skin:  Negative for rash.       chronic  Neurological:  Positive for tremors and weakness.       Balance px longterm no worse RLS day and PM  Psychiatric/Behavioral:  Positive for dysphoric mood. Negative for agitation, behavioral problems, confusion, decreased concentration, hallucinations, self-injury, sleep disturbance and suicidal ideas. The patient is not nervous/anxious and is not hyperactive.     Medications: I have reviewed the patient's current medications.  Current Outpatient Medications  Medication Sig Dispense Refill   acetaminophen (TYLENOL) 500 MG tablet Take 1,000 mg by mouth every 6 (six) hours as needed  for headache.     albuterol (VENTOLIN HFA) 108 (90 Base) MCG/ACT inhaler Inhale 1-2 puffs into the lungs every 6 (six) hours as needed for wheezing or shortness of breath.     cholecalciferol (VITAMIN D) 1000 UNITS tablet Take 2,000 Units by mouth daily.     furosemide (LASIX) 20 MG tablet Take 20 mg by mouth daily as needed for edema.     levothyroxine (SYNTHROID, LEVOTHROID) 25 MCG tablet Take 25 mcg by mouth daily before breakfast.     metoprolol succinate (TOPROL-XL) 25 MG 24 hr tablet Take 25 mg by mouth daily.     OLANZapine (ZYPREXA) 2.5 MG tablet Take 1 tablet (2.5 mg total) by mouth at bedtime. (Patient taking differently: Take 5 mg by mouth at bedtime.) 30 tablet 1   pantoprazole (PROTONIX) 40 MG tablet TAKE ONE TABLET BY MOUTH ONCE DAILY 30 tablet 11   rivaroxaban (XARELTO) 20 MG TABS tablet Take 20 mg by mouth daily after supper.      SUPER B COMPLEX/C PO Take 1 tablet by mouth daily.     buPROPion (WELLBUTRIN XL) 150 MG 24 hr tablet 1 in the AM for a week, then 2 each AM 30 tablet 0   No current facility-administered medications for this visit.    Medication Side Effects: resolved  Allergies:  Allergies  Allergen Reactions   Amlodipine Other (See Comments)    Stopped due to kidney issues   Vicodin  [Hydrocodone-Acetaminophen] Other (See Comments)    Patient is unsure of reaction    Past Medical History:  Diagnosis Date   Arthritis    Asthma    Atrial fibrillation ([place]) 01/12/2010   2D Echo EF=>55%   Bipolar disorder ([facility])    Chronic back pain    Depression    Dysrhythmia    AFib   GERD (gastroesophageal reflux disease)    History of colonic polyps    History of gout    Hyperlipidemia    Hypertension    Hypothyroidism    Morbid obesity ([facility])    OSA (obstructive sleep apnea)    AHI was 27.62hr RDI was 34.3 hr REM 0.00hr; had CPAP years ago but no longer uses.   Prostatitis    Tubular adenoma of colon 01/2016    Family History  Problem Relation Age of Onset   Diabetes Mother    Heart failure Mother    Hypertension Mother    Hypertension Father    Cancer Paternal Grandmother    Stroke Sister    Liver disease Brother    Vascular Disease Brother    Arthritis Brother    Colon cancer Neg Hx     Social History   Socioeconomic History   Marital status: Married    Spouse name: Not on file   Number of children: Not on file   Years of education: Not on file   Highest education level: Not on file  Occupational History   Not on file  Tobacco Use   Smoking status: Never   Smokeless tobacco: Never  Substance and Sexual Activity   Alcohol use: No    Alcohol/week: 0.0 standard drinks of alcohol   Drug use: No   Sexual activity: Yes    Birth control/protection: None  Other Topics Concern   Not on file  Social History Narrative   Not on file   Social Determinants of Health   Financial Resource Strain: Not on file  Food Insecurity: Not on file  Transportation Needs: Not on  file  Physical Activity: Not on file  Stress: Not on file  Social Connections: Not on file  Intimate Partner Violence: Not on file    Past Medical History, Surgical history, Social history, and Family history were reviewed and updated as appropriate.   Please see review of systems for  further details on the patient's review from today.   Objective:   Physical Exam:  There were no vitals taken for this visit.  Physical Exam Constitutional:      General: He is not in acute distress. Musculoskeletal:        General: No deformity.  Neurological:     Mental Status: He is alert and oriented to person, place, and time.     Coordination: Coordination normal.     Gait: Gait normal.     Comments: Mild rocking motions legs resolved Mild lip licking and buccal movements  Psychiatric:        Attention and Perception: He is attentive. He does not perceive auditory hallucinations.        Mood and Affect: Mood is depressed. Mood is not anxious. Affect is blunt. Affect is not labile, angry or inappropriate.        Speech: Speech is not rapid and pressured or tangential.        Behavior: Behavior normal. Behavior is not agitated.        Thought Content: Thought content normal. Thought content is not delusional. Thought content does not include homicidal or suicidal ideation. Thought content does not include suicidal plan.        Cognition and Memory: Cognition normal.        Judgment: Judgment normal.     Comments: Insight intact. No auditory or visual hallucinations. No delusions.  More depressed again Gait is a little slow but not marked.     Lab Review:     Component Value Date/Time   NA 141 03/11/2022 1128   NA 141 12/28/2018 0941   K 3.6 03/11/2022 1128   CL 110 03/11/2022 1128   CO2 25 03/11/2022 1128   GLUCOSE 91 03/11/2022 1128   BUN 18 03/11/2022 1128   BUN 16 12/28/2018 0941   CREATININE 1.18 03/11/2022 1128   CALCIUM 9.0 03/11/2022 1128   PROT 6.6 12/28/2018 0941   ALBUMIN 4.5 12/28/2018 0941   AST 21 12/28/2018 0941   ALT 22 12/28/2018 0941   ALKPHOS 115 12/28/2018 0941   BILITOT 0.7 12/28/2018 0941   GFRNONAA >60 03/11/2022 1128   GFRAA 63 12/28/2018 0941       Component Value Date/Time   WBC 8.1 12/28/2018 0941   WBC 5.4 06/30/2018 1311    RBC 4.81 12/28/2018 0941   RBC 4.52 06/30/2018 1311   HGB 15.2 12/28/2018 0941   HCT 42.9 12/28/2018 0941   PLT 186 12/28/2018 0941   MCV 89 12/28/2018 0941   MCH 31.6 12/28/2018 0941   MCH 31.9 06/30/2018 1311   MCHC 35.4 12/28/2018 0941   MCHC 32.8 06/30/2018 1311   RDW 12.9 12/28/2018 0941   LYMPHSABS 1.1 12/28/2018 0941   MONOABS 0.5 06/30/2018 1311   EOSABS 0.1 12/28/2018 0941   BASOSABS 0.0 12/28/2018 0941    No results found for: "POCLITH", "LITHIUM"   No results found for: "PHENYTOIN", "PHENOBARB", "VALPROATE", "CBMZ"   .res Assessment: Plan:    Jahmeek was seen today for follow-up and anxiety.  Diagnoses and all orders for this visit:  Bipolar affective disorder, currently depressed, mild ([place]) -  buPROPion (WELLBUTRIN XL) 150 MG 24 hr tablet; 1 in the AM for a week, then 2 each AM  Generalized anxiety disorder  Secondary restless legs syndrome  Insomnia due to mental condition   History pseudodementia with depression which is resolved.  Greater than 50% of 30 min face to face time with patient was spent on counseling and coordination of care. We discussed Dx bipolar at 78 yo.  Bipolar depression has recurred again like last year but so far is not as severe.  Patient is very complicated because of weakness and medical problems.     Hx seasonal changes so cautioned about mania in the spring.  Prednisone triggered some mania in 2023 and persisted off prednisone.    No RLS  and sleeping too much still  RLS better with less olanzapine.  Stop caffeine.  No longer manic. Disc dosing range.  Continue olanzapine 5 mg daily. Mild TD   If still a problem with sleepiness DT multiple med failures consider modafinil to manage.  Is getting more depressed again, resume Wellbutrin XL up to 300 mg AM Stop caffeine  Fall precautions.  Follow-up 2 mos  Lynder Parents, MD, DFAPA   Please see After Visit Summary for patient specific instructions.  Future  Appointments  Date Time Provider [place]  08/21/2022  8:20 AM Croitoru, Dani Gobble, MD [facility] None     No orders of the defined types were placed in this encounter.      -------------------------------

## 2022-08-14 NOTE — Patient Instructions (Signed)
Start Wellbutrin one of the 150 mg tablets in the morning for 1 week then 2 each morning. When he gets a refill of Wellbutrin it will be one of the 300 mg tablets

## 2022-08-21 ENCOUNTER — Encounter: Payer: Self-pay | Admitting: Cardiovascular Disease

## 2022-08-21 ENCOUNTER — Ambulatory Visit: Payer: Medicare Other | Attending: Cardiovascular Disease | Admitting: Cardiovascular Disease

## 2022-08-21 ENCOUNTER — Ambulatory Visit: Payer: Medicare Other | Admitting: Cardiovascular Disease

## 2022-08-21 VITALS — BP 136/76 | HR 66 | Ht 72.0 in | Wt 212.7 lb

## 2022-08-21 DIAGNOSIS — G4733 Obstructive sleep apnea (adult) (pediatric): Secondary | ICD-10-CM | POA: Diagnosis not present

## 2022-08-21 DIAGNOSIS — Z8659 Personal history of other mental and behavioral disorders: Secondary | ICD-10-CM | POA: Diagnosis not present

## 2022-08-21 DIAGNOSIS — I4821 Permanent atrial fibrillation: Secondary | ICD-10-CM | POA: Diagnosis not present

## 2022-08-21 DIAGNOSIS — E663 Overweight: Secondary | ICD-10-CM

## 2022-08-21 DIAGNOSIS — E7800 Pure hypercholesterolemia, unspecified: Secondary | ICD-10-CM

## 2022-08-21 DIAGNOSIS — D6869 Other thrombophilia: Secondary | ICD-10-CM | POA: Diagnosis not present

## 2022-08-21 DIAGNOSIS — I10 Essential (primary) hypertension: Secondary | ICD-10-CM

## 2022-08-21 MED ORDER — METOPROLOL SUCCINATE ER 25 MG PO TB24: 25.0000 mg | ORAL_TABLET | Freq: Every day | ORAL | 3 refills | Status: DC

## 2022-08-21 NOTE — Progress Notes (Signed)
Cardiology Office Note   Date:  08/21/2022   ID:  Casey [name], DOB 08-20-1945, MRN [id number]  PCP:  Sharilyn Sites, MD  Cardiologist:  Justis Dupas Electrophysiologist:  None   Evaluation Performed:  Follow-Up Visit  Chief Complaint:  AFib  History of Present Illness:    Casey [name] is a 77 y.o. male with permanent atrial fibrillation, but without other major structural cardiac abnormalities.  He has obstructive sleep apnea and longstanding struggles with bipolar disorder.  He has no cardiovascular complaints.  He has not had falls or bleeding problems.  Denies exertional dyspnea or chest pain and is not aware of any palpitations.  Has not had dizziness or syncope.  Denies focal neurological events, edema, orthopnea, PND, claudication.  Going through a little bit of depression right now, but not severe.  Reports labs including lipid profile and thyroid function testing performed by Dr. Hilma Favors in December with all labs in good range.  He feels tired all the time.  Although he was diagnosed with sleep apnea, he has not used CPAP in years.  After losing weight, he felt that this was cured.  He was prescribed CPAP twice before and on each occasion only was able to use it for couple of months before abandoning therapy. He has not been evaluated for a dental device.  He is missing a few teeth.  A couple of years ago he had a pleural effusion following trauma.    Past Medical History:  Diagnosis Date   Arthritis    Asthma    Atrial fibrillation ([place]) 01/12/2010   2D Echo EF=>55%   Bipolar disorder ([place])    Chronic back pain    Depression    Dysrhythmia    AFib   GERD (gastroesophageal reflux disease)    History of colonic polyps    History of gout    Hyperlipidemia    Hypertension    Hypothyroidism    Morbid obesity ([facility])    OSA (obstructive sleep apnea)    AHI was 27.62hr RDI was 34.3 hr REM 0.00hr; had CPAP years ago but no longer uses.   Prostatitis    Tubular adenoma  of colon 01/2016   Past Surgical History:  Procedure Laterality Date   APPENDECTOMY  1959   asthma     CATARACT EXTRACTION Bilateral 1995   COLONOSCOPY  2011   RMR: left-sided diverticula, minimal rectal friability. No polyps. Repeat 5 years.   COLONOSCOPY WITH PROPOFOL N/A 01/15/2016   Dr.Rourk- diverticulosis in the entire examined colon, multiple rectal and colonic polyps, internal hemorrhoids. bx= tubular adenomas and hyperplastic polyps.    COLONOSCOPY WITH PROPOFOL N/A 03/13/2022   Procedure: COLONOSCOPY WITH PROPOFOL;  Surgeon: Daneil Dolin, MD;  [facility];  Service: Endoscopy;  Laterality: N/A;  11:00am   HAND / FINGER LESION EXCISION Right    IR THORACENTESIS ASP PLEURAL SPACE W/IMG GUIDE  03/01/2019   IR THORACENTESIS ASP PLEURAL SPACE W/IMG GUIDE  03/31/2019   POLYPECTOMY  01/15/2016   Procedure: POLYPECTOMY;  Surgeon: Daneil Dolin, MD;  Location: [facility];  Service: Endoscopy;;   POLYPECTOMY  03/13/2022   Procedure: POLYPECTOMY;  Surgeon: Daneil Dolin, MD;  Location: [facility];  Service: Endoscopy;;   right shoulder surgery     TONSILLECTOMY  1955     Current Meds  Medication Sig   acetaminophen (TYLENOL) 500 MG tablet Take 1,000 mg by mouth every 6 (six) hours as needed for headache.  albuterol (VENTOLIN HFA) 108 (90 Base) MCG/ACT inhaler Inhale 1-2 puffs into the lungs every 6 (six) hours as needed for wheezing or shortness of breath.   buPROPion (WELLBUTRIN XL) 150 MG 24 hr tablet 1 in the AM for a week, then 2 each AM   cholecalciferol (VITAMIN D) 1000 UNITS tablet Take 2,000 Units by mouth daily.   levothyroxine (SYNTHROID, LEVOTHROID) 25 MCG tablet Take 25 mcg by mouth daily before breakfast.   OLANZapine (ZYPREXA) 5 MG tablet Take 1 tablet (5 mg total) by mouth at bedtime.   pantoprazole (PROTONIX) 40 MG tablet TAKE ONE TABLET BY MOUTH ONCE DAILY   rivaroxaban (XARELTO) 20 MG TABS tablet Take 20 mg by mouth daily after supper.    SUPER B  COMPLEX/C PO Take 1 tablet by mouth daily.   [DISCONTINUED] metoprolol succinate (TOPROL-XL) 25 MG 24 hr tablet Take 25 mg by mouth daily.     Allergies:   Amlodipine and Vicodin [hydrocodone-acetaminophen]   Social History   Tobacco Use   Smoking status: Never   Smokeless tobacco: Never  Substance Use Topics   Alcohol use: No    Alcohol/week: 0.0 standard drinks of alcohol   Drug use: No     Family Hx: The patient's family history includes Arthritis in his brother; Cancer in his paternal grandmother; Diabetes in his mother; Heart failure in his mother; Hypertension in his father and mother; Liver disease in his brother; Stroke in his sister; Vascular Disease in his brother. There is no history of Colon cancer.  ROS:   Please see the history of present illness.     All other systems reviewed and are negative.   Prior CV studies:   The following studies were reviewed today:   Labs/Other Tests and Data Reviewed:    EKG:  Ordered today, shows atrial fibrillation with controlled ventricular rate, otherwise normal tracing.  QTc 440 ms.  Recent Labs: 03/11/2022: BUN 18; Creatinine, Ser 1.18; Potassium 3.6; Sodium 141  06/27/2022 Creatinine 1.32, potassium 4.4, TSH 1.4, hemoglobin 13.7  Recent Lipid Panel No results found for: "CHOL", "TRIG", "HDL", "CHOLHDL", "LDLCALC", "LDLDIRECT"  01/28/2019 Total cholesterol 183, HDL 60, LDL 111, triglycerides 58    Wt Readings from Last 3 Encounters:  08/21/22 212 lb 11.2 oz (96.5 kg)  03/13/22 223 lb 1.7 oz (101.2 kg)  03/11/22 223 lb 1.7 oz (101.2 kg)     Objective:    Vital Signs:  BP 136/76 (BP Location: Left Arm, Patient Position: Sitting, Cuff Size: Large)   Pulse 66   Ht 6' (1.829 m)   Wt 212 lb 11.2 oz (96.5 kg)   SpO2 96%   BMI 28.85 kg/m     General: Alert, oriented x3, no distress, overweight. Head: no evidence of trauma, PERRL, EOMI, no exophtalmos or lid lag, no myxedema, no xanthelasma; normal ears, nose and  oropharynx Neck: normal jugular venous pulsations and no hepatojugular reflux; brisk carotid pulses without delay and no carotid bruits Chest: clear to auscultation, no signs of consolidation by percussion or palpation, normal fremitus, symmetrical and full respiratory excursions Cardiovascular: normal position and quality of the apical impulse, regular rhythm, normal first and second heart sounds, no murmurs, rubs or gallops Abdomen: no tenderness or distention, no masses by palpation, no abnormal pulsatility or arterial bruits, normal bowel sounds, no hepatosplenomegaly Extremities: no clubbing, cyanosis or edema; 2+ radial, ulnar and brachial pulses bilaterally; 2+ right femoral, posterior tibial and dorsalis pedis pulses; 2+ left femoral, posterior tibial and dorsalis pedis  pulses; no subclavian or femoral bruits Neurological: grossly nonfocal Psych: Normal mood and affect    ASSESSMENT & PLAN:    1. Permanent atrial fibrillation   2. Acquired thrombophilia ([place])   3. OSA (obstructive sleep apnea)   4. History of bipolar disorder   5. Overweight   6. Hypercholesterolemia   7. Essential hypertension       AFib: Good rate control on a low-dose of metoprolol succinate, which will be continued.  The potential interaction with Wellbutrin is duly noted (increase metoprolol metabolism due to CYP inhibition). CHA2DS2-VASc  almost 2 (age 36). Xarelto: No serious bleeding complications. OSA: May be part of his complaints of fatigue, together with the tachyarrhythmia and depression.  Weight Bipolar disorder: Seems to be reasonably well compensated. Overweight: As before, he tends to be lean when he is in a period of depression.  Recovering right now. Hypercholesterolemia: All parameters in desirable range.  I HTN: Questionable diagnosis.  Seems to fluctuate with his weight.  Only antihypertensive medication he is currently taking is the low-dose of metoprolol.   Patient Instructions   Medication Instructions:  Your physician recommends that you continue on your current medications as directed. Please refer to the Current Medication list given to you today.  *If you need a refill on your cardiac medications before your next appointment, please call your pharmacy*   Lab Work: NONE If you have labs (blood work) drawn today and your tests are completely normal, you will receive your results only by: [place] (if you have MyChart) OR A paper copy in the mail If you have any lab test that is abnormal or we need to change your treatment, we will call you to review the results.   Testing/Procedures: NONE   Follow-Up: At [facility], you and your health needs are our priority.  As part of our continuing mission to provide you with exceptional heart care, we have created designated Provider Care Teams.  These Care Teams include your primary Cardiologist (physician) and Advanced Practice Providers (APPs -  Physician Assistants and Nurse Practitioners) who all work together to provide you with the care you need, when you need it.  We recommend signing up for the patient portal called "MyChart".  Sign up information is provided on this After Visit Summary.  MyChart is used to connect with patients for Virtual Visits (Telemedicine).  Patients are able to view lab/test results, encounter notes, upcoming appointments, etc.  Non-urgent messages can be sent to your provider as well.   To learn more about what you can do with MyChart, go to [email or web address].    Your next appointment:   1 year(s)  Provider:   Sanda Klein, MD      Signed, Sanda Klein, MD  08/21/2022 9:13 AM    [place]

## 2022-08-21 NOTE — Patient Instructions (Signed)
Medication Instructions:  Your physician recommends that you continue on your current medications as directed. Please refer to the Current Medication list given to you today.  *If you need a refill on your cardiac medications before your next appointment, please call your pharmacy*   Lab Work: NONE If you have labs (blood work) drawn today and your tests are completely normal, you will receive your results only by: [place] (if you have MyChart) OR A paper copy in the mail If you have any lab test that is abnormal or we need to change your treatment, we will call you to review the results.   Testing/Procedures: NONE   Follow-Up: At [facility], you and your health needs are our priority.  As part of our continuing mission to provide you with exceptional heart care, we have created designated Provider Care Teams.  These Care Teams include your primary Cardiologist (physician) and Advanced Practice Providers (APPs -  Physician Assistants and Nurse Practitioners) who all work together to provide you with the care you need, when you need it.  We recommend signing up for the patient portal called "MyChart".  Sign up information is provided on this After Visit Summary.  MyChart is used to connect with patients for Virtual Visits (Telemedicine).  Patients are able to view lab/test results, encounter notes, upcoming appointments, etc.  Non-urgent messages can be sent to your provider as well.   To learn more about what you can do with MyChart, go to [email or web address].    Your next appointment:   1 year(s)  Provider:   Sanda Klein, MD

## 2022-08-27 ENCOUNTER — Telehealth: Payer: Self-pay | Admitting: Psychiatry

## 2022-08-27 MED ORDER — BUPROPION HCL ER (XL) 300 MG PO TB24: 300.0000 mg | ORAL_TABLET | Freq: Every day | ORAL | 0 refills | Status: DC

## 2022-08-27 NOTE — Telephone Encounter (Signed)
Sent!

## 2022-08-27 NOTE — Telephone Encounter (Signed)
Pt's called at 4:20p.  He is requesting refill of generic Wellbutrin, 340m instead of 2 pills of 1596m  [place]    [facility] [address] [id number]: [phone]: [phone]  Next appt 4/1

## 2022-09-23 ENCOUNTER — Other Ambulatory Visit: Payer: Self-pay | Admitting: Psychiatry

## 2022-10-05 ENCOUNTER — Other Ambulatory Visit: Payer: Self-pay | Admitting: Psychiatry

## 2022-10-05 DIAGNOSIS — F3110 Bipolar disorder, current episode manic without psychotic features, unspecified: Secondary | ICD-10-CM

## 2022-10-05 DIAGNOSIS — F411 Generalized anxiety disorder: Secondary | ICD-10-CM

## 2022-10-14 ENCOUNTER — Encounter: Payer: Self-pay | Admitting: Psychiatry

## 2022-10-14 ENCOUNTER — Ambulatory Visit (INDEPENDENT_AMBULATORY_CARE_PROVIDER_SITE_OTHER): Payer: Medicare Other | Admitting: Psychiatry

## 2022-10-14 DIAGNOSIS — F5105 Insomnia due to other mental disorder: Secondary | ICD-10-CM | POA: Diagnosis not present

## 2022-10-14 DIAGNOSIS — F3131 Bipolar disorder, current episode depressed, mild: Secondary | ICD-10-CM | POA: Diagnosis not present

## 2022-10-14 DIAGNOSIS — F411 Generalized anxiety disorder: Secondary | ICD-10-CM

## 2022-10-14 DIAGNOSIS — G2581 Restless legs syndrome: Secondary | ICD-10-CM | POA: Diagnosis not present

## 2022-10-14 DIAGNOSIS — Z8669 Personal history of other diseases of the nervous system and sense organs: Secondary | ICD-10-CM

## 2022-10-14 MED ORDER — PRAMIPEXOLE DIHYDROCHLORIDE 0.25 MG PO TABS: ORAL_TABLET | ORAL | 1 refills | Status: DC

## 2022-10-14 NOTE — Progress Notes (Signed)
CARR MATTINSON OW:[id number] [birth year] 77 y.o.    Subjective:   Patient ID:  Casey [name] is a 77 y.o. (DOB May 02, 1946) male.  Chief Complaint:  Chief Complaint  Patient presents with   Follow-up   Depression    HPI seen with wife Nareg Achuff presents for follow-up of bipolar disorder and anxiety and history of confusion  At visit  Dec 02, 2018.  He was having confusion and benztropine was stopped to see if that was the cause.  Also it was having balance issues and therefore Trileptal was switched all to nighttime 1200 mg nightly. Confusion much better per both of them with DC benztropine.  Balance better with change Trileptal.   visit was January 13, 2019 and he was doing well as noted above and he was encouraged not to make further med changes at that time.  visit April 13, 2019.  He was doing better with regard to depression than he had in quite some time on the Wellbutrin and no meds were changed.  He was having health problems with pulmonary effusion and fluid removed twice related to fall in January 2020.  seen September 13, 2019.  For residual depression the following decisions were made: Balance problem much improved with dosing Trileptal all in the evening. Depression under partial control & wife wants it to be better per his wife.  No mood swings.   Increase bupropion XL to 2 of the 150 mg tablets in the morning until the current bottle is gone. Then new RX will be 1 of the 300 mg tablets each morning.  November 08, 2019 appointment the following is noted:  Seen with wife, [name]. Small amount of improvement with change.  Anxiety got worse.  Notices when he goes out.  Worries about social and political things.  Wife keeps him active at home.  Allerigies bother him..  No sig caffeine except tea when goes out.  Wife sees him as a little more active and initiative.  More productive.  Started some cooking.  Getting out and up better.  A little more talkative.   "He wants to  be hyper but we know what happens". Wife [name] thinks   But for the last 2 years he's done nothing and laid around all the time.  He's a lot more active now. Big improvement. Went to church first time in 2 years. Plan:  For TRD, Pramipexole 0.25 mg tablet 1/2 twice daily for 1 week then 1 twice daily.  12/20/2019 appointment with the following noted:  Seen with wife. A little better motivated to get out but not to get in crowds.  Wife agrees more motivated and went to church once alone without pressure.  Wife says he's more talkative and much  Better interest.  Still not motivated to do things like mow the grass.  Others's have seen a difference. Concentration and memory are improved but not normal. No Se pramipexole.  Getting out of bed earlier and better now.. Wife admisters meds bc earlier he messsed them up. Plan: Wife has seen a big improvement therefore Partial improvement is clear from this so increase Pramipexole 0.375 mg tablet  twice daily  Anxiety is not fully managed.  Partly related to depression. Tremor is manageable.    02/15/20 appt with the following noted:  Send with wife [name] Doing good.  Increased pramipexole as indicated.  Tolerating meds Wellbutrin XL 300 mg AM & Trileptal 1200 mg HS with it. Much better with depression.  Stays up more and speinding less time in bed.   Wife agrees he's much better and handling things better. 2 1/2 yo 4th cousin girl drowned in pond lately.  Big family. Wife pleased he handled it better. SE constipation ? Related. History Linzess but diarrhea. He's not highly motivated but it is better.  Depression is much improved.  Had been depressed for years. Plan no med changes  06/13/2020 appointment with the following noted: Pretty good but cardiologist noted today increased pulse needing treatment.  Uncontrolled afib even after ablation. Hard to tell about depression bc of fatigue Dt heart problems. Wife said he did well Thanksgiving and  participated more in conversation per wife. Plan: no med changes  09/06/2020 appointment with following noted: Remains on Wellbutrin XL 300 mg every morning, ginkgo biloba 40 mg twice daily, oxcarbazepine 1200 mg nightly, pramipexole 0.75 mg twice daily. Less sig health concerns. He thinks mood has been pretty good.  Consistent with meds. Wife thinks he stays tired and sleepy and wonders if can give him a stimulant.   Sleep good.  Will nap.  Doesn't bother him.  Walks dog 20 min in AM.  Not much initiative per wife.  Not depressed but when depressed he stayed in the bed lasting a couple of years.  History of OSA but lost weight and doesn't think he has apnea anymore.  No CPAP. Chronic afib. Not much caffeine. Plan: Wife& patient has seen a big improvement with Pramipexole 0.375 mg tablet  twice daily They ask consideration be given to the following: If his energy gets better by treating his tachycardia they would like to consider reducing the pramipexole or possibly eliminating it at follow-up.   For energy trial reduction pramipexole to 0.25 mg BID.  If gets more depressed call. Alternative of modafinil 100  12/04/2020 appointment with the following noted: Energy is better with awakening earlier at 38 and doing more and getting out more.  Going to bed later with 8 hour  Sleep. Mood fluctuates with more reacting to wife mainly.  More outspoken.  Per W he has to vent.  She thinks he is too focused on politics and issues.   Plan: Depression under control.  No mood swings.   Continue bupropion XL 300 mg tablets each morning. Continue Trileptal 600 mg 2 at HS. No higher DT anxiety risk and no traditional stimulants DT afib.  Wife & patient has seen a big improvement with Pramipexole and able to reduce to  0.25 mg tablet  twice daily  02/20/2021 appointment with the following noted:  seen with wife Prednisone made him real hyper.  Been off of it for at least a couple weeks.  Hyperverbal and  hyperactive.  Hip pain interferes with sleep.  Sees doctor tomorrow. Average 3-4 hours at night and no naps bc busy.  No excessive spending but more than usual.  Denies appetite disturbance.  Patient reports that energy and motivation have been reduced.  Patient has difficulty with concentration and memory.  Patient denies any suicidal ideation.  W had MI in June. A/P: mild mania Wean Wellbutrin over 2 weeks. Continue Trileptal 600 mg 2 at HS. Wife & patient has seen a big improvement with Pramipexole and able to reduce to  0.25 mg tablet  twice daily. Continue it unless mania does not resolve with less bupropion.  03/26/2021 phone call: [name] stated [place] has slightly improved since last week.He has not been able to sleep and was up at 2 am texting  people in a disoriented state.His pcp put him on Ambien 10 mg and this has helped him sleep.However,linda stated he is still working him self too much.She said he is jittery and has to have something to do at all times.He does not want to slow down or get rest.She thinks he needs to cutback on the Mirapex as well. MD response:  I agree with his wife about reducing Mirapex from 1 tablet twice a day to half tablet twice a day for 1 week and if his symptoms are not resolved and stop the Mirapex.  04/18/2021 phone call with wife and MD: Patient is still manic after stopping Wellbutrin and pramipexole.  Wife's concern is hard to get him to settle down at night.  Reviewed with wife prior psychiatric medications which are multiple.  Typically one would use an antipsychotic in this case but she does not think that they worked in the past.  She says he has been on higher doses of Trileptal and tolerated it. Increase Trileptal to 600 mg every morning and 1200 mg nightly for mania.  Keep scheduled appointment.  05/02/2021 wife reports patient still manic: We cannot go higher in the Trileptal because of balance issues.  Therefore olanzapine 10 mg nightly was started and  we discussed the option of hospitalization.  We will try to get the patient in urgently to an appointment.  05/07/2021 appointment with the following noted: seen with wife, [name] Not doing too well.  Trouble getting out of bed.  Wife says he's still up in middle of the night but he says he's sleeping better.  Sleep in recliner downstairs. She says he's still manic with hyperactive, hyper graphia but won't show her what he's writing. On olanzapine  No changes off Wellbutrin and pramipexole. Apparently more balance problems with increase Trileptal to 1800 mg daily.  Dropped back to 1200 mg HS. Admits to some confusion.  Has pulled off labels on bottle per wife. She thinks he's averaging 4-6 hours of sleep and a little better than it was. Wife said he started cussing and he has not done that in the past. Other episodes of inappropriate decision making.  He recognizes he's manic but can't control himself.  Hyperverbal, pressured.  In past had speech problems with mania also.  Neg neuro work up for stroke in the past. SE balance and tremor. Some trouble getting out of chair? Plan: For mania,Increase olanzapine to 15 mg PM Follow-up Friday for urgent visit  05/11/2021 appointment with the following noted: Calming down a little. He thinks he's sleeping better.  She says he needs whole Ambien with olanzapine and last night still not sleeping.  He woke her with complaints of back pain. She's not seeing SE and nor is he.  She's not seeing much change  He says back pain is complicating his sleep and his problem. He tries to stay active.   She says he talks incessantly but not as much to her. She says major life events have triggered mania in him in the past. She notices him making poor decisions. Plan: Increase olanzapine to 20 mg PM  05/24/2021 appointment with the following noted: with D Nevin Bloodgood A week ago went of to doctor's office alone and wife called bc he was agitated. Per wife still going wide  open but is sleeping better. Yesterday had fever and talking out of his head and dx UTI. He still feels driven to do chores but distractible and inefficient.   D does not see  improvement and nor does wife.   7 hour sleep lately. Plan: Increase olanzapine to 30 mg in evening Reduce oxcarbazepine to 1 and 1/2 tablets in the evening.  06/04/2021 appointment: Seen with wife Sleeping more 7-9 hours.   Per wife still some confusion and still short tempered.  Less pressured sleep. He won't go to bed when sleepy and waits too long to get in bed and is weak and cannot do it by himself. Wife agrees he's less "wide open" than before and will be able to sit and watch TV show now. B died yesterday. Plan: Continue olanzapine to 30 mg in evening Reduce oxcarbazepine  again to 1 tablets in the evening for 1 week, then 1/2 tablet at night for a week then stop it.  07/04/21 appt noted:  seen with wife [name] Hyper-ness is better.  Some trouble with concentration but able to make peanut butter fudge. Problems with afib.   Sleeping a lot and some instability.  Goes to sleep in chair.  Better with irritability.  No longer talking constantly but still some in spells.   Not depressed.   No change in balance off oxcarbazepine. Wife said last week mixed up meds and got oversedated.  She discovered the option. Plan: Reduce olanzapine to 20 mg PM now that mania is largely resolved.  08/06/2021 appointment with the following noted: Only psych med is reduced olanzapine 20 mg HS RLS and fidgety.  All day.  Can sit for an hour.  Uncomfortable when sitting. Stopped Ginko and wife noticed RLS more.  She restarted it.  No longer rocking back and forth. Caffeine tea at lunch.  No coffee..  [place] some. RLS recently started. Bipolar sx great per wife.  Not wanting to go out much. CO tiredness.  Falls asleep in recliner.  Sleep all night. Eats sweets all his life. Plan: No longer manic. Reduce olanzapine to 15 mg  earlier in PM Stop caffeine  09/06/2021 appointment with the following noted: Reduced olanzapine 15 mg and still RLS all the day. No change in that.  But can sit in church ok.  Will move legs when standing and talking to people. Sleeping more than he should from 10 until 9. And still naps and little initiative bc tired. Not depressed but not back to normal.  Not as social as usual. Loves to eat out. Plan: Reduce olanzapine from 15 to 7.5 mg earlier in PM Stop caffeine  10/02/2021 appointment with the following noted: seen again with wife [name] Only psych med is the reduced dose of olanzapine 7.5 mg for 30 days. Back pain limits activity and getting shots. Taking tramadol for pain.  1-2 prn.  One helps. 2 is sedating. RLS is better and mainly now the problem is back pain.  Has to sleep in recliner DT pain.   [name] says hard to tell about effect from reduced olanzapine bc he's having such back pain and taking tramadol.  Doesn't feel like doing anything. Plan continue olanzapine 7.5 mg every afternoon  12/04/2021 appointment with the following noted: seen with wife Still doing well with olanzapine 7.5 mg PM No SE.  RLS is OK now. Sleep good 8-10 hours. Not depressed nor manic sx.   Happy with meds.   Wife gall bladder surgery. H helping wife. Plan: olanzapine 7.5 mg earlier in PM Stopcaffeine   03/19/2022 phone call: Wife called complaining that her husband sleeping 14 to 15 hours a day on olanzapine 7.5 mg daily and wondering about trying  to reduce it. MD response: Olanzapine is his only mood stabilizer at present.  There is a risk that he could get manic again with the dosage reduction.  But we can try reducing from 7.5 to 5 mg at night.  That may or may not help with his tendency to sleep excessively.  Some people will do that even on low doses of olanzapine.  Please send in prescription for olanzapine 5 mg tablets 1 nightly #30, 1 additional refill and make a note in the pharmacy section  of the order to cancel any other olanzapine refills.     06/04/2022 appointment noted:  wife says a tad better with less olanzapine but still wants to sleep and sit around a lot. Keeps saying I'm not depressed.   Reduced olanzapine 5 mg HS. No mania or depression. Hx sleep study and dx OSA but per wife got better with wt loss.  Not using CPAP. Eating less but wt loss stable. Asks about seasonal pattern bc of the history.  She can't remember the seasonal pattern.   Plan: Reduce  DT sleepiness olanzapine 2.5 mg earlier in PM Mild TD   07/29/22 TC: no changes with reduction so increased back to 5 mg olanzapine.  08/14/22 appt noted: Still sleeping too much 12 hours.   Also been irritable with flashes.   Something can set him off. Probably getting depressed some with less olanzapine.  Some anxiety.   Some reduction in interest and not going to church like before and doesn't like to be around people.  Doesn't want wife to help others either.   No restlessness.  Will nap in the afternoon. Not enjoying things and not laughing. Plan: Is getting more depressed again, resume Wellbutrin XL up to 300 mg AM  10/14/22 appt noted:  with wife [name] Psych Med: olanzapine 5 HS, Wellbutrin 300 mg AM Feels a little more like getting out but wife says not much.  She sees him being a little more agitated and irritable.  Still doesn't want to go out normally or be with other people.  Usual self is social.  Don't talk as much.  Doesn't want to be alone and doesn't want wife to be social. Sleeps a lot.  Partly DT back pain. Not nervous or anxious.   Past Psychiatric Medication Trials: Seroquel 300,  olanzapine 30 resolved mania,  (Improved  with Increase olanzapine to 30 mg PM and off the oxcarbazepine.) perphenazine, risperidone,  Latuda,  Abilify, Lithium did great helped but SE afib, kidneys and prostate,    alprazolam,  Depakote, Trileptal 1880 balance problems  carbamazepine, lamotrigine caused  nausea,  citalopram, sertraline 150,    Wellbutrin 300 anxiety and irritability and failed 10/2022,  Pramipexole 0.375 BID helped Benztropine confusion        1 prior psych hosp depression                                                    Review of Systems:  Review of Systems  Constitutional:  Positive for fatigue.  HENT:         Clicking teeth  Eyes:  Negative for visual disturbance.  Gastrointestinal:  Positive for constipation.  Musculoskeletal:  Positive for arthralgias, back pain and gait problem.  Skin:  Negative for rash.       chronic  Neurological:  Positive for tremors and weakness.       Balance px longterm no worse RLS day and PM  Psychiatric/Behavioral:  Positive for dysphoric mood. Negative for agitation, behavioral problems, confusion, decreased concentration, hallucinations, self-injury, sleep disturbance and suicidal ideas. The patient is not nervous/anxious and is not hyperactive.     Medications: I have reviewed the patient's current medications.  Current Outpatient Medications  Medication Sig Dispense Refill   acetaminophen (TYLENOL) 500 MG tablet Take 1,000 mg by mouth every 6 (six) hours as needed for headache.     cholecalciferol (VITAMIN D) 1000 UNITS tablet Take 2,000 Units by mouth daily.     furosemide (LASIX) 20 MG tablet Take 20 mg by mouth daily as needed for edema.     levothyroxine (SYNTHROID, LEVOTHROID) 25 MCG tablet Take 25 mcg by mouth daily before breakfast.     metoprolol succinate (TOPROL-XL) 25 MG 24 hr tablet Take 1 tablet (25 mg total) by mouth daily. 90 tablet 3   OLANZapine (ZYPREXA) 5 MG tablet TAKE 1 TABLET BY MOUTH AT BEDTIME 30 tablet 0   pantoprazole (PROTONIX) 40 MG tablet TAKE ONE TABLET BY MOUTH ONCE DAILY 30 tablet 11   pramipexole (MIRAPEX) 0.25 MG tablet 1 twice daily for 4 days then 1 and 1/2 tablets twice daily 90 tablet 1   rivaroxaban (XARELTO) 20 MG TABS tablet Take 20 mg by mouth daily after supper.      SUPER B  COMPLEX/C PO Take 1 tablet by mouth daily.     No current facility-administered medications for this visit.    Medication Side Effects: resolved  Allergies:  Allergies  Allergen Reactions   Amlodipine Other (See Comments)    Stopped due to kidney issues   Vicodin [Hydrocodone-Acetaminophen] Other (See Comments)    Patient is unsure of reaction    Past Medical History:  Diagnosis Date   Arthritis    Asthma    Atrial fibrillation 01/12/2010   2D Echo EF=>55%   Bipolar disorder    Chronic back pain    Depression    Dysrhythmia    AFib   GERD (gastroesophageal reflux disease)    History of colonic polyps    History of gout    Hyperlipidemia    Hypertension    Hypothyroidism    Morbid obesity    OSA (obstructive sleep apnea)    AHI was 27.62hr RDI was 34.3 hr REM 0.00hr; had CPAP years ago but no longer uses.   Prostatitis    Tubular adenoma of colon 01/2016    Family History  Problem Relation Age of Onset   Diabetes Mother    Heart failure Mother    Hypertension Mother    Hypertension Father    Cancer Paternal Grandmother    Stroke Sister    Liver disease Brother    Vascular Disease Brother    Arthritis Brother    Colon cancer Neg Hx     Social History   Socioeconomic History   Marital status: Married    Spouse name: Not on file   Number of children: Not on file   Years of education: Not on file   Highest education level: Not on file  Occupational History   Not on file  Tobacco Use   Smoking status: Never   Smokeless tobacco: Never  Substance and Sexual Activity   Alcohol use: No    Alcohol/week: 0.0 standard drinks of alcohol   Drug use: No   Sexual activity: Yes  Birth control/protection: None  Other Topics Concern   Not on file  Social History Narrative   Not on file   Social Determinants of Health   Financial Resource Strain: Not on file  Food Insecurity: Not on file  Transportation Needs: Not on file  Physical Activity: Not on file   Stress: Not on file  Social Connections: Not on file  Intimate Partner Violence: Not on file    Past Medical History, Surgical history, Social history, and Family history were reviewed and updated as appropriate.   Please see review of systems for further details on the patient's review from today.   Objective:   Physical Exam:  There were no vitals taken for this visit.  Physical Exam Constitutional:      General: He is not in acute distress. Musculoskeletal:        General: No deformity.  Neurological:     Mental Status: He is alert and oriented to person, place, and time.     Coordination: Coordination normal.     Gait: Gait normal.     Comments: Mild rocking motions legs resolved Mild lip licking and buccal movements  Psychiatric:        Attention and Perception: He is attentive. He does not perceive auditory hallucinations.        Mood and Affect: Mood is depressed. Mood is not anxious. Affect is blunt. Affect is not labile, angry or inappropriate.        Speech: Speech is not rapid and pressured or tangential.        Behavior: Behavior normal. Behavior is not agitated.        Thought Content: Thought content normal. Thought content is not delusional. Thought content does not include homicidal or suicidal ideation. Thought content does not include suicidal plan.        Cognition and Memory: Cognition normal.        Judgment: Judgment normal.     Comments: Insight intact. No auditory or visual hallucinations. No delusions.  More depressed again Gait is a little slow but not marked. Lip smacking noticeable.     Lab Review:     Component Value Date/Time   NA 141 03/11/2022 1128   NA 141 12/28/2018 0941   K 3.6 03/11/2022 1128   CL 110 03/11/2022 1128   CO2 25 03/11/2022 1128   GLUCOSE 91 03/11/2022 1128   BUN 18 03/11/2022 1128   BUN 16 12/28/2018 0941   CREATININE 1.18 03/11/2022 1128   CALCIUM 9.0 03/11/2022 1128   PROT 6.6 12/28/2018 0941   ALBUMIN 4.5  12/28/2018 0941   AST 21 12/28/2018 0941   ALT 22 12/28/2018 0941   ALKPHOS 115 12/28/2018 0941   BILITOT 0.7 12/28/2018 0941   GFRNONAA >60 03/11/2022 1128   GFRAA 63 12/28/2018 0941       Component Value Date/Time   WBC 8.1 12/28/2018 0941   WBC 5.4 06/30/2018 1311   RBC 4.81 12/28/2018 0941   RBC 4.52 06/30/2018 1311   HGB 15.2 12/28/2018 0941   HCT 42.9 12/28/2018 0941   PLT 186 12/28/2018 0941   MCV 89 12/28/2018 0941   MCH 31.6 12/28/2018 0941   MCH 31.9 06/30/2018 1311   MCHC 35.4 12/28/2018 0941   MCHC 32.8 06/30/2018 1311   RDW 12.9 12/28/2018 0941   LYMPHSABS 1.1 12/28/2018 0941   MONOABS 0.5 06/30/2018 1311   EOSABS 0.1 12/28/2018 0941   BASOSABS 0.0 12/28/2018 0941    No results found for: "  POCLITH", "LITHIUM"   No results found for: "PHENYTOIN", "PHENOBARB", "VALPROATE", "CBMZ"   .res Assessment: Plan:    Divyansh was seen today for follow-up and depression.  Diagnoses and all orders for this visit:  Bipolar affective disorder, currently depressed, mild -     pramipexole (MIRAPEX) 0.25 MG tablet; 1 twice daily for 4 days then 1 and 1/2 tablets twice daily  Generalized anxiety disorder  Secondary restless legs syndrome  Insomnia due to mental condition  History of obstructive sleep apnea   History pseudodementia with depression which is resolved.  Mild lip smacking consistent with TD.  Will follow  Greater than 50% of 30 min face to face time with patient was spent on counseling and coordination of care. We discussed Dx bipolar at 77 yo.  Bipolar depression has recurred again like last year but so far is not as severe.  Patient is very complicated because of weakness and medical problems.     Hx seasonal changes so cautioned about mania in the spring.  Prednisone triggered some mania in 2023 and persisted off prednisone.    No RLS  and sleeping too much still  RLS better with less olanzapine.  Stop caffeine.  No longer manic. Disc dosing range.   Continue olanzapine 5 mg daily. Mild TD   If still a problem with sleepiness DT multiple med failures consider modafinil to manage.  Is having apathetic depressed again,  and failed Wellbutrin.   Resume pramipexole off label which worked before,  0.25 mg BID for 4 days then 0.375 mg BID  Stop caffeine  Fall precautions.  Follow-up 6 weeks  Lynder Parents, MD, DFAPA   Please see After Visit Summary for patient specific instructions.  No future appointments.    No orders of the defined types were placed in this encounter.      -------------------------------

## 2022-10-23 ENCOUNTER — Telehealth: Payer: Self-pay | Admitting: Psychiatry

## 2022-10-23 NOTE — Telephone Encounter (Signed)
Spouse [name] called stating [name] started a new med Mirapex. Dr Jennelle Human stated results should be seen by Friday. Patient did show improvement briefly, but now he's not feeling any better. [name] is inquiring if the dosage need to be increased. Please advise. Contact # [phone]

## 2022-10-24 NOTE — Telephone Encounter (Signed)
Pls verify he's taking 1 and 1/2 of the 0.25 mg tablets pramipexole twice daily.  If so then increase to 2 of the 0.25 mg tablets twice daily. Thanks.

## 2022-10-24 NOTE — Telephone Encounter (Signed)
Wife said that patient did well for a couple of days on the Mirapex, but saying now he is unchanged, sits around all day, no increased energy. She said he is taking as prescribed.  Resume pramipexole off label which worked before,  0.25 mg BID for 4 days then 0.375 mg BID  He takes olanzapine 5 mg QHS as well.

## 2022-10-24 NOTE — Telephone Encounter (Signed)
I did verify with wife in initial conversation and re-verified after receiving recommendations. She said patient was taking 1-1/2 of the 0.25 twice a day and advised her to have patient take 2 0.25 mg twice a day.

## 2022-11-02 ENCOUNTER — Other Ambulatory Visit: Payer: Self-pay | Admitting: Psychiatry

## 2022-11-02 DIAGNOSIS — F411 Generalized anxiety disorder: Secondary | ICD-10-CM

## 2022-11-02 DIAGNOSIS — F3110 Bipolar disorder, current episode manic without psychotic features, unspecified: Secondary | ICD-10-CM

## 2022-11-24 ENCOUNTER — Observation Stay (HOSPITAL_COMMUNITY)
Admission: EM | Admit: 2022-11-24 | Discharge: 2022-11-26 | Disposition: A | Discharge status: Home Health | Payer: Medicare Other | Attending: Internal Medicine | Admitting: Internal Medicine

## 2022-11-24 ENCOUNTER — Observation Stay (HOSPITAL_COMMUNITY): Payer: Medicare Other

## 2022-11-24 ENCOUNTER — Other Ambulatory Visit: Payer: Self-pay

## 2022-11-24 ENCOUNTER — Encounter (HOSPITAL_COMMUNITY): Payer: Self-pay

## 2022-11-24 ENCOUNTER — Emergency Department (HOSPITAL_COMMUNITY): Payer: Medicare Other

## 2022-11-24 DIAGNOSIS — Z7901 Long term (current) use of anticoagulants: Secondary | ICD-10-CM | POA: Insufficient documentation

## 2022-11-24 DIAGNOSIS — K219 Gastro-esophageal reflux disease without esophagitis: Secondary | ICD-10-CM

## 2022-11-24 DIAGNOSIS — N189 Chronic kidney disease, unspecified: Secondary | ICD-10-CM | POA: Diagnosis not present

## 2022-11-24 DIAGNOSIS — I6782 Cerebral ischemia: Secondary | ICD-10-CM | POA: Diagnosis not present

## 2022-11-24 DIAGNOSIS — R4781 Slurred speech: Secondary | ICD-10-CM | POA: Insufficient documentation

## 2022-11-24 DIAGNOSIS — F419 Anxiety disorder, unspecified: Secondary | ICD-10-CM | POA: Diagnosis present

## 2022-11-24 DIAGNOSIS — I4821 Permanent atrial fibrillation: Secondary | ICD-10-CM | POA: Diagnosis not present

## 2022-11-24 DIAGNOSIS — Z8659 Personal history of other mental and behavioral disorders: Secondary | ICD-10-CM

## 2022-11-24 DIAGNOSIS — E785 Hyperlipidemia, unspecified: Secondary | ICD-10-CM

## 2022-11-24 DIAGNOSIS — G4733 Obstructive sleep apnea (adult) (pediatric): Secondary | ICD-10-CM

## 2022-11-24 DIAGNOSIS — R531 Weakness: Secondary | ICD-10-CM

## 2022-11-24 DIAGNOSIS — I129 Hypertensive chronic kidney disease with stage 1 through stage 4 chronic kidney disease, or unspecified chronic kidney disease: Secondary | ICD-10-CM | POA: Insufficient documentation

## 2022-11-24 DIAGNOSIS — I4891 Unspecified atrial fibrillation: Secondary | ICD-10-CM | POA: Diagnosis not present

## 2022-11-24 DIAGNOSIS — K5730 Diverticulosis of large intestine without perforation or abscess without bleeding: Secondary | ICD-10-CM | POA: Diagnosis present

## 2022-11-24 DIAGNOSIS — I639 Cerebral infarction, unspecified: Principal | ICD-10-CM | POA: Insufficient documentation

## 2022-11-24 DIAGNOSIS — G4730 Sleep apnea, unspecified: Secondary | ICD-10-CM | POA: Diagnosis present

## 2022-11-24 DIAGNOSIS — J45909 Unspecified asthma, uncomplicated: Secondary | ICD-10-CM | POA: Diagnosis not present

## 2022-11-24 DIAGNOSIS — I10 Essential (primary) hypertension: Secondary | ICD-10-CM | POA: Diagnosis not present

## 2022-11-24 DIAGNOSIS — Z79899 Other long term (current) drug therapy: Secondary | ICD-10-CM | POA: Insufficient documentation

## 2022-11-24 DIAGNOSIS — E039 Hypothyroidism, unspecified: Secondary | ICD-10-CM | POA: Diagnosis not present

## 2022-11-24 DIAGNOSIS — R29810 Facial weakness: Secondary | ICD-10-CM | POA: Diagnosis not present

## 2022-11-24 LAB — URINALYSIS, ROUTINE W REFLEX MICROSCOPIC
Bilirubin Urine: NEGATIVE
Glucose, UA: NEGATIVE mg/dL
Hgb urine dipstick: NEGATIVE
Ketones, ur: NEGATIVE mg/dL
Nitrite: NEGATIVE
Protein, ur: NEGATIVE mg/dL
Specific Gravity, Urine: 1.009 (ref 1.005–1.030)
WBC, UA: >50 WBC/hpf (ref 0–5)
pH: 5 (ref 5.0–8.0)

## 2022-11-24 LAB — CBC
HCT: 42.2 % (ref 39.0–52.0)
HCT: 42.1 % (ref 39.0–52.0)
Hemoglobin: 14.2 g/dL (ref 13.0–17.0)
Hemoglobin: 14.2 g/dL (ref 13.0–17.0)
MCH: 31.8 pg (ref 26.0–34.0)
MCH: 32.1 pg (ref 26.0–34.0)
MCHC: 33.6 g/dL (ref 30.0–36.0)
MCHC: 33.7 g/dL (ref 30.0–36.0)
MCV: 94.6 fL (ref 80.0–100.0)
MCV: 95.2 fL (ref 80.0–100.0)
Platelets: 162 10*3/uL (ref 150–400)
Platelets: 153 10*3/uL (ref 150–400)
RBC: 4.46 MIL/uL (ref 4.22–5.81)
RBC: 4.42 MIL/uL (ref 4.22–5.81)
RDW: 13.2 % (ref 11.5–15.5)
RDW: 13.2 % (ref 11.5–15.5)
WBC: 7.2 10*3/uL (ref 4.0–10.5)
WBC: 7.3 10*3/uL (ref 4.0–10.5)
nRBC: 0 % (ref 0.0–0.2)
nRBC: 0 % (ref 0.0–0.2)

## 2022-11-24 LAB — COMPREHENSIVE METABOLIC PANEL
ALT: 13 U/L (ref 0–44)
AST: 18 U/L (ref 15–41)
Albumin: 3.9 g/dL (ref 3.5–5.0)
Alkaline Phosphatase: 70 U/L (ref 38–126)
Anion gap: 8 (ref 5–15)
BUN: 21 mg/dL (ref 8–23)
CO2: 26 mmol/L (ref 22–32)
Calcium: 8.8 mg/dL — ABNORMAL LOW (ref 8.9–10.3)
Chloride: 104 mmol/L (ref 98–111)
Creatinine, Ser: 1.38 mg/dL — ABNORMAL HIGH (ref 0.61–1.24)
GFR, Estimated: 53 mL/min — ABNORMAL LOW (ref >60)
Glucose, Bld: 103 mg/dL — ABNORMAL HIGH (ref 70–99)
Potassium: 3.9 mmol/L (ref 3.5–5.1)
Sodium: 138 mmol/L (ref 135–145)
Total Bilirubin: 0.8 mg/dL (ref 0.3–1.2)
Total Protein: 7 g/dL (ref 6.5–8.1)

## 2022-11-24 LAB — DIFFERENTIAL
Abs Immature Granulocytes: 0.02 10*3/uL (ref 0.00–0.07)
Basophils Absolute: 0 10*3/uL (ref 0.0–0.1)
Basophils Relative: 0 %
Eosinophils Absolute: 0.2 10*3/uL (ref 0.0–0.5)
Eosinophils Relative: 3 %
Immature Granulocytes: 0 %
Lymphocytes Relative: 13 %
Lymphs Abs: 0.9 10*3/uL (ref 0.7–4.0)
Monocytes Absolute: 0.5 10*3/uL (ref 0.1–1.0)
Monocytes Relative: 7 %
Neutro Abs: 5.5 10*3/uL (ref 1.7–7.7)
Neutrophils Relative %: 77 %

## 2022-11-24 LAB — RAPID URINE DRUG SCREEN, HOSP PERFORMED
Amphetamines: NOT DETECTED
Barbiturates: NOT DETECTED
Benzodiazepines: NOT DETECTED
Cocaine: NOT DETECTED
Opiates: NOT DETECTED
Tetrahydrocannabinol: NOT DETECTED

## 2022-11-24 LAB — HEMOGLOBIN A1C
Hgb A1c MFr Bld: 5.2 % (ref 4.8–5.6)
Mean Plasma Glucose: 102.54 mg/dL

## 2022-11-24 LAB — APTT: aPTT: 38 seconds — ABNORMAL HIGH (ref 24–36)

## 2022-11-24 LAB — PROTIME-INR
INR: 1.5 — ABNORMAL HIGH (ref 0.8–1.2)
Prothrombin Time: 17.9 seconds — ABNORMAL HIGH (ref 11.4–15.2)

## 2022-11-24 LAB — CBG MONITORING, ED: Glucose-Capillary: 102 mg/dL — ABNORMAL HIGH (ref 70–99)

## 2022-11-24 LAB — ETHANOL: Alcohol, Ethyl (B): <10 mg/dL (ref <10)

## 2022-11-24 MED ORDER — STROKE: EARLY STAGES OF RECOVERY BOOK
Freq: Once | Status: DC
  Filled 2022-11-24: qty 1

## 2022-11-24 MED ORDER — PRAMIPEXOLE DIHYDROCHLORIDE 0.25 MG PO TABS
0.3750 mg | ORAL_TABLET | Freq: Three times a day (TID) | ORAL | Status: DC
  Administered 2022-11-25 (×2): 0.375 mg via ORAL
  Filled 2022-11-24 (×4): qty 1.5

## 2022-11-24 MED ORDER — OLANZAPINE 5 MG PO TABS
5.0000 mg | ORAL_TABLET | Freq: Every day | ORAL | Status: DC
  Administered 2022-11-25 (×2): 5 mg via ORAL
  Filled 2022-11-24 (×2): qty 1

## 2022-11-24 MED ORDER — PANTOPRAZOLE SODIUM 40 MG PO TBEC
40.0000 mg | DELAYED_RELEASE_TABLET | Freq: Every day | ORAL | Status: DC
  Administered 2022-11-25 – 2022-11-26 (×2): 40 mg via ORAL
  Filled 2022-11-24 (×2): qty 1

## 2022-11-24 MED ORDER — BUPROPION HCL ER (XL) 150 MG PO TB24
300.0000 mg | ORAL_TABLET | Freq: Every day | ORAL | Status: DC
  Administered 2022-11-25: 300 mg via ORAL
  Filled 2022-11-24: qty 2

## 2022-11-24 MED ORDER — METOPROLOL SUCCINATE ER 25 MG PO TB24: 12.5000 mg | ORAL_TABLET | Freq: Every day | ORAL | Status: DC

## 2022-11-24 MED ORDER — ACETAMINOPHEN 650 MG RE SUPP: 650.0000 mg | RECTAL | Status: DC | PRN

## 2022-11-24 MED ORDER — RIVAROXABAN 20 MG PO TABS
20.0000 mg | ORAL_TABLET | Freq: Every day | ORAL | Status: DC
  Administered 2022-11-25: 20 mg via ORAL
  Filled 2022-11-24: qty 1

## 2022-11-24 MED ORDER — LEVOTHYROXINE SODIUM 50 MCG PO TABS
25.0000 ug | ORAL_TABLET | Freq: Every day | ORAL | Status: DC
  Administered 2022-11-25 – 2022-11-26 (×2): 25 ug via ORAL
  Filled 2022-11-24 (×2): qty 1

## 2022-11-24 MED ORDER — SENNOSIDES-DOCUSATE SODIUM 8.6-50 MG PO TABS: 1.0000 | ORAL_TABLET | Freq: Every evening | ORAL | Status: DC | PRN

## 2022-11-24 MED ORDER — ACETAMINOPHEN 160 MG/5ML PO SOLN: 650.0000 mg | ORAL | Status: DC | PRN

## 2022-11-24 MED ORDER — ACETAMINOPHEN 325 MG PO TABS: 650.0000 mg | ORAL_TABLET | ORAL | Status: DC | PRN

## 2022-11-24 MED ORDER — SODIUM CHLORIDE 0.9 % IV SOLN: INTRAVENOUS | Status: AC

## 2022-11-24 NOTE — ED Triage Notes (Signed)
Pt was noted to have left side facial droop, slurred speech and left arm weakness while at church today that resolved by the time EMS arrived.

## 2022-11-24 NOTE — Hospital Course (Addendum)
77 year old gentleman with history of permanent atrial fibrillation on rivaroxaban, OSA, bipolar disorder medically managed, mild tardive dyskinesia (from olanzepine), RLS, CKD, GERD, bipolar depression with h/o pseudodementia with depression, hypertension, hyperlipidemia who was in church today with his wife and while ambulating he developed acute left-sided weakness around noon.  He also notably had left arm grip and drift weakness.  Spouse reports that she noticed a left-sided facial droop.  EMS confirmed left arm drift/grip weakness and called a code stroke.  Patient has no history of cerebrovascular disease.  Fortunately when he arrived to the emergency department his symptoms have resolved.  He was sent for urgent CT head with no acute findings.  Code stroke in the ED he was evaluated by teleneurology team recommended he be admitted for stroke workup and MRI brain. Unable to obtain MRI at this facility so will need to transfer patient to [facility] in [place] for MRI.

## 2022-11-24 NOTE — Consult Note (Signed)
NEUROLOGY CONSULTATION NOTE   Date of service: Nov 24, 2022 Patient Name: Casey [name] MRN:  [id number] DOB:  September 06, 1945 Reason for consult: telestroke Requesting physician: Dr. Vivi Barrack _ _ _   _ __   _ __ _ _  __ __   _ __   __ _  History of Present Illness   This is a 77 yo man with hx a fib on xarelto last dose last night, OSA, bipolar, mild TD 2/2 olanzapine, RLS, HTN, HL who presented after developing acute onset L sided weakness in [place]. LKW 1200. Sx improving but not yet resolved. NIHSS = 3 for L facial droop, LUE drift, and L sensory deficit. CT head unremarkable on personal review. Not a TNK candidate 2/2 xarelto. CTA not performed 2/2 exam not c/w LVO.   ROS   Per HPI: all other systems reviewed and are negative  Past History   I have reviewed the following:  Past Medical History:  Diagnosis Date   Arthritis    Asthma    Atrial fibrillation (HCC) 01/12/2010   2D Echo EF=>55%   Bipolar disorder (HCC)    Chronic back pain    Depression    Dysrhythmia    AFib   GERD (gastroesophageal reflux disease)    History of colonic polyps    History of gout    Hyperlipidemia    Hypertension    Hypothyroidism    Morbid obesity (HCC)    OSA (obstructive sleep apnea)    AHI was 27.62hr RDI was 34.3 hr REM 0.00hr; had CPAP years ago but no longer uses.   Prostatitis    Tubular adenoma of colon 01/2016   Past Surgical History:  Procedure Laterality Date   APPENDECTOMY  1959   asthma     CATARACT EXTRACTION Bilateral 1995   COLONOSCOPY  2011   RMR: left-sided diverticula, minimal rectal friability. No polyps. Repeat 5 years.   COLONOSCOPY WITH PROPOFOL N/A 01/15/2016   Dr.Rourk- diverticulosis in the entire examined colon, multiple rectal and colonic polyps, internal hemorrhoids. bx= tubular adenomas and hyperplastic polyps.    COLONOSCOPY WITH PROPOFOL N/A 03/13/2022   Procedure: COLONOSCOPY WITH PROPOFOL;  Surgeon: Corbin Ade, MD;  Location: [facility];   Service: Endoscopy;  Laterality: N/A;  11:00am   HAND / FINGER LESION EXCISION Right    IR THORACENTESIS ASP PLEURAL SPACE W/IMG GUIDE  03/01/2019   IR THORACENTESIS ASP PLEURAL SPACE W/IMG GUIDE  03/31/2019   POLYPECTOMY  01/15/2016   Procedure: POLYPECTOMY;  Surgeon: Corbin Ade, MD;  Location: [facility];  Service: Endoscopy;;   POLYPECTOMY  03/13/2022   Procedure: POLYPECTOMY;  Surgeon: Corbin Ade, MD;  Location: [facility];  Service: Endoscopy;;   right shoulder surgery     TONSILLECTOMY  1955   Family History  Problem Relation Age of Onset   Diabetes Mother    Heart failure Mother    Hypertension Mother    Hypertension Father    Cancer Paternal Grandmother    Stroke Sister    Liver disease Brother    Vascular Disease Brother    Arthritis Brother    Colon cancer Neg Hx    Social History   Socioeconomic History   Marital status: Married    Spouse name: Not on file   Number of children: Not on file   Years of education: Not on file   Highest education level: Not on file  Occupational History   Not on  file  Tobacco Use   Smoking status: Never   Smokeless tobacco: Never  Substance and Sexual Activity   Alcohol use: No    Alcohol/week: 0.0 standard drinks of alcohol   Drug use: No   Sexual activity: Yes    Birth control/protection: None  Other Topics Concern   Not on file  Social History Narrative   Not on file   Social Determinants of Health   Financial Resource Strain: Not on file  Food Insecurity: Not on file  Transportation Needs: Not on file  Physical Activity: Not on file  Stress: Not on file  Social Connections: Not on file   Allergies  Allergen Reactions   Amlodipine Other (See Comments)    Stopped due to kidney issues   Vicodin [Hydrocodone-Acetaminophen] Other (See Comments)    Patient is unsure of reaction    Medications   (Not in a hospital admission)     Current Facility-Administered Medications:    [START ON 11/25/2022]   stroke: early stages of recovery book, , Does not apply, Once, Johnson, Clanford L, MD   0.9 %  sodium chloride infusion, , Intravenous, Continuous, Johnson, Clanford L, MD, Last Rate: 50 mL/hr at 11/24/22 1541, New Bag at 11/24/22 1541   acetaminophen (TYLENOL) tablet 650 mg, 650 mg, Oral, Q4H PRN **OR** acetaminophen (TYLENOL) 160 MG/5ML solution 650 mg, 650 mg, Per Tube, Q4H PRN **OR** acetaminophen (TYLENOL) suppository 650 mg, 650 mg, Rectal, Q4H PRN, Johnson, Clanford L, MD   [START ON 11/25/2022] buPROPion (WELLBUTRIN XL) 24 hr tablet 300 mg, 300 mg, Oral, Q breakfast, Johnson, Clanford L, MD   [START ON 11/25/2022] levothyroxine (SYNTHROID) tablet 25 mcg, 25 mcg, Oral, QAC breakfast, Johnson, Clanford L, MD   [START ON 11/25/2022] metoprolol succinate (TOPROL-XL) 24 hr tablet 12.5 mg, 12.5 mg, Oral, Daily, Johnson, Clanford L, MD   OLANZapine (ZYPREXA) tablet 5 mg, 5 mg, Oral, QHS, Johnson, Clanford L, MD   [START ON 11/25/2022] pantoprazole (PROTONIX) EC tablet 40 mg, 40 mg, Oral, Daily, Johnson, Clanford L, MD   pramipexole (MIRAPEX) tablet 0.375 mg, 0.375 mg, Oral, TID, Johnson, Clanford L, MD   rivaroxaban (XARELTO) tablet 20 mg, 20 mg, Oral, QPC supper, Johnson, Clanford L, MD   senna-docusate (Senokot-S) tablet 1 tablet, 1 tablet, Oral, QHS PRN, Johnson, Clanford L, MD  Current Outpatient Medications:    acetaminophen (TYLENOL) 500 MG tablet, Take 1,000 mg by mouth every 6 (six) hours as needed for headache., Disp: , Rfl:    cholecalciferol (VITAMIN D) 1000 UNITS tablet, Take 2,000 Units by mouth daily., Disp: , Rfl:    furosemide (LASIX) 20 MG tablet, Take 20 mg by mouth daily as needed for edema., Disp: , Rfl:    levothyroxine (SYNTHROID, LEVOTHROID) 25 MCG tablet, Take 25 mcg by mouth daily before breakfast., Disp: , Rfl:    metoprolol succinate (TOPROL-XL) 25 MG 24 hr tablet, Take 1 tablet (25 mg total) by mouth daily., Disp: 90 tablet, Rfl: 3   OLANZapine (ZYPREXA) 5 MG tablet, TAKE  1 TABLET BY MOUTH AT BEDTIME, Disp: 30 tablet, Rfl: 0   pantoprazole (PROTONIX) 40 MG tablet, TAKE ONE TABLET BY MOUTH ONCE DAILY, Disp: 30 tablet, Rfl: 11   pramipexole (MIRAPEX) 0.25 MG tablet, 1 twice daily for 4 days then 1 and 1/2 tablets twice daily, Disp: 90 tablet, Rfl: 1   rivaroxaban (XARELTO) 20 MG TABS tablet, Take 20 mg by mouth daily after supper. , Disp: , Rfl:    SUPER B COMPLEX/C PO,  Take 1 tablet by mouth daily., Disp: , Rfl:   Vitals   Vitals:   11/24/22 1430 11/24/22 1630 11/24/22 1700 11/24/22 1714  BP: (!) 163/69 (!) 154/54    Pulse: 65 63 67   Resp: 16 20 (!) 26   Temp:    98.1 F (36.7 C)  TempSrc:    Oral  SpO2: 99% 96% 96%   Weight:      Height:         Body mass index is 28.85 kg/m.  Physical Exam   Physical Exam Gen: A&O x4, NAD HEENT: Atraumatic, normocephalic;mucous membranes moist; oropharynx clear, tongue without atrophy or fasciculations. Neck: Supple, trachea midline. Resp: CTAB, no w/r/r CV: RRR, no m/g/r; nml S1 and S2. 2+ symmetric peripheral pulses. Abd: soft/NT/ND; nabs x 4 quad Extrem: Nml bulk; no cyanosis, clubbing, or edema.  Neuro: *MS: A&O x4. Follows multi-step commands.  *Speech: fluid, nondysarthric, able to name and repeat *CN:    I: Deferred   II,III: PERRLA, VFF by confrontation, optic discs unable to be visualized 2/2 pupillary constriction   III,IV,VI: EOMI w/o nystagmus, no ptosis   V: Sensation intact from V1 to V3 to LT   VII: Eyelid closure was full.  L NLF flattening   VIII: Hearing intact to voice   IX,X: Voice normal, palate elevates symmetrically    XI: SCM/trap 5/5 bilat   XII: Tongue protrudes midline, no atrophy or fasciculations   *Motor:   Normal bulk.  No tremor, rigidity or bradykinesia. Minimal drift LUE no drift in any other extremity *Sensory: Minimal impairment to LT in LLE. Propioception intact bilat.  No double-simultaneous extinction.  *Coordination:  Finger-to-nose, heel-to-shin, rapid  alternating motions were intact. *Reflexes:  2+ and symmetric throughout without clonus; toes down-going bilat *Gait: deferred  NIHSS = 3 for facial droop, LUE drift, and sensory deficit  Premorbid mRS = 1   Labs   CBC:  Recent Labs  Lab 11/24/22 1252 11/24/22 1433  WBC 7.2 7.3  NEUTROABS 5.5  --   HGB 14.2 14.2  HCT 42.2 42.1  MCV 94.6 95.2  PLT 162 153    Basic Metabolic Panel:  Lab Results  Component Value Date   NA 138 11/24/2022   K 3.9 11/24/2022   CO2 26 11/24/2022   GLUCOSE 103 (H) 11/24/2022   BUN 21 11/24/2022   CREATININE 1.38 (H) 11/24/2022   CALCIUM 8.8 (L) 11/24/2022   GFRNONAA 53 (L) 11/24/2022   GFRAA 63 12/28/2018   Lipid Panel: No results found for: "LDLCALC" HgbA1c: No results found for: "HGBA1C" Urine Drug Screen:     Component Value Date/Time   LABOPIA NONE DETECTED 11/24/2022 1535   COCAINSCRNUR NONE DETECTED 11/24/2022 1535   LABBENZ NONE DETECTED 11/24/2022 1535   AMPHETMU NONE DETECTED 11/24/2022 1535   THCU NONE DETECTED 11/24/2022 1535   LABBARB NONE DETECTED 11/24/2022 1535    Alcohol Level     Component Value Date/Time   ETH <10 11/24/2022 1252    CT Head without contrast: unremarkable  Impression   This is a 77 yo man with hx a fib on xarelto last dose last night, OSA, bipolar, mild TD 2/2 olanzapine, RLS, HTN, HL who presented after developing acute onset L sided weakness at 1200. Not a TNK candidate 2/2 xarelto. Sx improving, NIHSS = 3 but all deficits are minimal therefore OK to continue xarelto while awaiting MRI brain (exam not c/w moderate or large acute infarct).   Recommendations   - Admit  for [facility] for stroke workup (MRI down at [facility]). Notify neurology upon arrival - Permissive HTN x48 hrs from sx onset or until stroke ruled out by MRI goal BP <220/110. PRN labetalol or hydralazine if BP above these parameters. Avoid oral antihypertensives. - MRI brain wo contrast - CTA or MRA H&N - TTE - Check A1c and LDL + add  statin per guidelines - Deficits are minimal, OK to continue xarelto. No neurologic indication for antiplatelets in addition to therapeutic anticoagulation - q4 hr neuro checks - STAT head CT for any change in neuro exam - Tele - PT/OT/SLP - Stroke education - Amb referral to neurology upon discharge   Stroke team will follow after transfer to [facility]  ______________________________________________________________________   Thank you for the opportunity to take part in the care of this patient. If you have any further questions, please contact the neurology consultation attending.  Signed,  Bing Neighbors, MD [facility] [phone]  If 7pm- 7am, please page neurology on call as listed in AMION.  **Any copied and pasted documentation in this note was written by me in another application not billed for and pasted by me into this document.

## 2022-11-24 NOTE — ED Notes (Signed)
Pt care taken, no complaints at this time. Awaiting transport to [place].

## 2022-11-24 NOTE — H&P (Addendum)
History and Physical  [facility]  Casey [name] AVW:[id number] DOB: [birth year] DOA: 11/24/2022  PCP: Assunta Found, MD  Patient coming from: [facility]  Level of care: Telemetry Medical  I have personally briefly reviewed patient's old medical records in [facility]  Chief Complaint: acute left side weakness, facial droop   HPI: Casey [name] is a 77 year old gentleman with history of permanent atrial fibrillation on rivaroxaban, OSA, bipolar disorder medically managed, mild tardive dyskinesia (from olanzepine), RLS, CKD, GERD, bipolar depression with h/o pseudodementia with depression, hypertension, hyperlipidemia who was in church today with his wife and while ambulating he developed acute left-sided weakness around noon.  He also notably had left arm grip and drift weakness.  Spouse reports that she noticed a left-sided facial droop.  EMS confirmed left arm drift/grip weakness and called a code stroke.  Patient has no history of cerebrovascular disease.  Fortunately when he arrived to the emergency department his symptoms have resolved.  He was sent for urgent CT head with no acute findings.  Code stroke in the ED he was evaluated by teleneurology team recommended he be admitted for stroke workup and MRI brain. Unable to obtain MRI at this facility so will need to transfer patient to [facility] in [place] for MRI.      Past Medical History:  Diagnosis Date   Arthritis    Asthma    Atrial fibrillation (HCC) 01/12/2010   2D Echo EF=>55%   Bipolar disorder (HCC)    Chronic back pain    Depression    Dysrhythmia    AFib   GERD (gastroesophageal reflux disease)    History of colonic polyps    History of gout    Hyperlipidemia    Hypertension    Hypothyroidism    Morbid obesity (HCC)    OSA (obstructive sleep apnea)    AHI was 27.62hr RDI was 34.3 hr REM 0.00hr; had CPAP years ago but no longer uses.   Prostatitis    Tubular adenoma of colon 01/2016    Past  Surgical History:  Procedure Laterality Date   APPENDECTOMY  1959   asthma     CATARACT EXTRACTION Bilateral 1995   COLONOSCOPY  2011   RMR: left-sided diverticula, minimal rectal friability. No polyps. Repeat 5 years.   COLONOSCOPY WITH PROPOFOL N/A 01/15/2016   Dr.Rourk- diverticulosis in the entire examined colon, multiple rectal and colonic polyps, internal hemorrhoids. bx= tubular adenomas and hyperplastic polyps.    COLONOSCOPY WITH PROPOFOL N/A 03/13/2022   Procedure: COLONOSCOPY WITH PROPOFOL;  Surgeon: Corbin Ade, MD;  [facility]: [facility];  Service: Endoscopy;  Laterality: N/A;  11:00am   HAND / FINGER LESION EXCISION Right    IR THORACENTESIS ASP PLEURAL SPACE W/IMG GUIDE  03/01/2019   IR THORACENTESIS ASP PLEURAL SPACE W/IMG GUIDE  03/31/2019   POLYPECTOMY  01/15/2016   Procedure: POLYPECTOMY;  Surgeon: Corbin Ade, MD;  Location: [facility];  Service: Endoscopy;;   POLYPECTOMY  03/13/2022   Procedure: POLYPECTOMY;  Surgeon: Corbin Ade, MD;  Location: [facility];  Service: Endoscopy;;   right shoulder surgery     TONSILLECTOMY  1955     reports that he has never smoked. He has never used smokeless tobacco. He reports that he does not drink alcohol and does not use drugs.  Allergies  Allergen Reactions   Amlodipine Other (See Comments)    Stopped due to kidney issues   Vicodin [Hydrocodone-Acetaminophen] Other (  See Comments)    Patient is unsure of reaction    Family History  Problem Relation Age of Onset   Diabetes Mother    Heart failure Mother    Hypertension Mother    Hypertension Father    Cancer Paternal Grandmother    Stroke Sister    Liver disease Brother    Vascular Disease Brother    Arthritis Brother    Colon cancer Neg Hx     Prior to Admission medications   Medication Sig Start Date End Date Taking? Authorizing Provider  acetaminophen (TYLENOL) 500 MG tablet Take 1,000 mg by mouth every 6 (six) hours as needed for headache.     [provider]  cholecalciferol (VITAMIN D) 1000 UNITS tablet Take 2,000 Units by mouth daily.    [provider]  furosemide (LASIX) 20 MG tablet Take 20 mg by mouth daily as needed for edema.    [provider]  levothyroxine (SYNTHROID, LEVOTHROID) 25 MCG tablet Take 25 mcg by mouth daily before breakfast. 04/06/15   [provider]  metoprolol succinate (TOPROL-XL) 25 MG 24 hr tablet Take 1 tablet (25 mg total) by mouth daily. 08/21/22   Croitoru, Mihai, MD  OLANZapine (ZYPREXA) 5 MG tablet TAKE 1 TABLET BY MOUTH AT BEDTIME 11/03/22   Cottle, Steva Ready., MD  pantoprazole (PROTONIX) 40 MG tablet TAKE ONE TABLET BY MOUTH ONCE DAILY 03/07/17   Anice Paganini, NP  pramipexole (MIRAPEX) 0.25 MG tablet 1 twice daily for 4 days then 1 and 1/2 tablets twice daily 10/14/22   Cottle, Steva Ready., MD  rivaroxaban (XARELTO) 20 MG TABS tablet Take 20 mg by mouth daily after supper.     [provider]  SUPER B COMPLEX/C PO Take 1 tablet by mouth daily.    [provider]    Physical Exam: Vitals:   11/24/22 1315 11/24/22 1330 11/24/22 1345 11/24/22 1430  BP:  (!) 156/84  (!) 163/69  Pulse: 73 75 72 65  Resp: 16 18 17 16   Temp:      TempSrc:      SpO2: 98% 98% 99% 99%  Weight:      Height:        Constitutional: NAD, calm, comfortable, delayed speech noted; mild facial droop;  Eyes: PERRL, lids and conjunctivae normal ENMT: Mucous membranes are moist. Posterior pharynx clear of any exudate or lesions.Normal dentition.  Neck: normal, supple, no masses, no thyromegaly Respiratory: clear to auscultation bilaterally, no wheezing, no crackles. Normal respiratory effort. No accessory muscle use.  Cardiovascular: normal s1, s2 sounds, no murmurs / rubs / gallops. No extremity edema. 2+ pedal pulses. No carotid bruits.  Abdomen: no tenderness, no masses palpated. No hepatosplenomegaly. Bowel sounds positive.  Musculoskeletal: no clubbing / cyanosis. No  joint deformity upper and lower extremities. Good ROM, no contractures. Normal muscle tone.  Skin: no rashes, lesions, ulcers. No induration Neurologic: CN 2-12 grossly intact. Sensation intact, DTR normal. Strength 5/5 in all 4.  Psychiatric: Normal judgment and insight. Alert and oriented x 3. Normal mood.   Labs on Admission: I have personally reviewed following labs and imaging studies  CBC: Recent Labs  Lab 11/24/22 1252 11/24/22 1433  WBC 7.2 7.3  NEUTROABS 5.5  --   HGB 14.2 14.2  HCT 42.2 42.1  MCV 94.6 95.2  PLT 162 153   Basic Metabolic Panel: Recent Labs  Lab 11/24/22 1252  NA 138  K 3.9  CL 104  CO2 26  GLUCOSE 103*  BUN 21  CREATININE 1.38*  CALCIUM 8.8*   GFR: Estimated Creatinine Clearance: 55.7 mL/min (A) (by C-G formula based on SCr of 1.38 mg/dL (H)). Liver Function Tests: Recent Labs  Lab 11/24/22 1252  AST 18  ALT 13  ALKPHOS 70  BILITOT 0.8  PROT 7.0  ALBUMIN 3.9   No results for input(s): "LIPASE", "AMYLASE" in the last 168 hours. No results for input(s): "AMMONIA" in the last 168 hours. Coagulation Profile: Recent Labs  Lab 11/24/22 1252  INR 1.5*   Cardiac Enzymes: No results for input(s): "CKTOTAL", "CKMB", "CKMBINDEX", "TROPONINI" in the last 168 hours. BNP (last 3 results) No results for input(s): "PROBNP" in the last 8760 hours. HbA1C: No results for input(s): "HGBA1C" in the last 72 hours. CBG: Recent Labs  Lab 11/24/22 1243  GLUCAP 102*   Lipid Profile: No results for input(s): "CHOL", "HDL", "LDLCALC", "TRIG", "CHOLHDL", "LDLDIRECT" in the last 72 hours. Thyroid Function Tests: No results for input(s): "TSH", "T4TOTAL", "FREET4", "T3FREE", "THYROIDAB" in the last 72 hours. Anemia Panel: No results for input(s): "VITAMINB12", "FOLATE", "FERRITIN", "TIBC", "IRON", "RETICCTPCT" in the last 72 hours. Urine analysis:    Component Value Date/Time   COLORURINE YELLOW 03/26/2019 1447   APPEARANCEUR HAZY (A) 03/26/2019  1447   LABSPEC 1.013 03/26/2019 1447   PHURINE 5.0 03/26/2019 1447   GLUCOSEU NEGATIVE 03/26/2019 1447   HGBUR NEGATIVE 03/26/2019 1447   BILIRUBINUR NEGATIVE 03/26/2019 1447   KETONESUR NEGATIVE 03/26/2019 1447   PROTEINUR NEGATIVE 03/26/2019 1447   NITRITE POSITIVE (A) 03/26/2019 1447   LEUKOCYTESUR NEGATIVE 03/26/2019 1447   Radiological Exams on Admission: CT HEAD CODE STROKE WO CONTRAST  Result Date: 11/24/2022 CLINICAL DATA:  Code stroke. Neuro deficit, acute, stroke suspected. Left-sided weakness. EXAM: CT HEAD WITHOUT CONTRAST TECHNIQUE: Contiguous axial images were obtained from the base of the skull through the vertex without intravenous contrast. RADIATION DOSE REDUCTION: This exam was performed according to the departmental dose-optimization program which includes automated exposure control, adjustment of the mA and/or kV according to patient size and/or use of iterative reconstruction technique. COMPARISON:  Head CT 11/02/2018 FINDINGS: Brain: Generalized parenchymal atrophy. Commensurate prominence of the ventricles and sulci. Patchy and ill-defined hypoattenuation within the cerebral white matter, nonspecific but compatible with mild chronic small vessel ischemic disease. There is no acute intracranial hemorrhage. No demarcated cortical infarct. No extra-axial fluid collection. No evidence of an intracranial mass. No midline shift. Vascular: No hyperdense vessel. Atherosclerotic calcifications. Skull: No fracture or aggressive osseous lesion. Sinuses/Orbits: No mass or acute finding within the imaged orbits. No significant paranasal sinus disease at the imaged levels. ASPECTS [facility] Stroke Program Early CT Score) - Ganglionic level infarction (caudate, lentiform nuclei, internal capsule, insula, M1-M3 cortex): 7 - Supraganglionic infarction (M4-M6 cortex): 3 Total score (0-10 with 10 being normal): 10 No evidence of an acute intracranial abnormality. These results were called by  telephone at the time of interpretation on 11/24/2022 at 1:07 pm to provider Dr. Jearld Fenton, who verbally acknowledged these results. IMPRESSION: 1.  No evidence of an acute intracranial abnormality. 2. Parenchymal atrophy and chronic small vessel ischemic disease. Electronically Signed   By: Jackey Loge D.O.   On: 11/24/2022 13:07    EKG: Independently reviewed. Atrial fibrillation with controlled ventricular rate   Assessment/Plan Principal Problem:   Acute CVA (cerebrovascular accident) (HCC) Active Problems:   Hypothyroidism   History of bipolar disorder   Essential hypertension   Permanent atrial fibrillation   Hyperlipidemia LDL goal <130  Diverticulosis of colon without hemorrhage   Sleep apnea   Anxiety   GERD (gastroesophageal reflux disease)  Question of acute CVA vs TIA  -symptoms continue to improve over time -neurologist recommending stroke work up and MRI brain  -MRI not available at [facility] for next 2-3 days so will need to admit to [facility] -ischemic CVA orderset bundle started -neuro checks -check bedside swallow evaluation  -gentle IV fluid ordered -permissive hypertension  -check A1c, fasting lipid panel -check TTE  -obtain PT/OT eval -MRA if stroke positive  -check carotid doppler studies -resume daily rivaroxaban for afib related stroke prevention  Hypothyroidism  - resume home levothyroxine daily before breakfast  OSA - will offer nightly CPAP   Hyperlipidemia -currently not on statin therapy -follow up fasting lipid panel   Bipolar disorder, Bipolar depression -medically managed, resume home olanzepine 5 mg QHS  -resume home wellbutrin XL 300 mg daily  -he is being followed closely by Dr. Jennelle Human with behavioral health  Essential hypertension  -allow permissive hypertension until CVA ruled out  Atrial Fibrillation permanent - he remains on metoprolol XL for ventricular rate control - he remains on rivaroxaban for full dose anticoagulation and stroke  prevention  GERD  - pantoprazole 40 mg daily resumed from home meds  - elevate head of bed to prevent acid reflux   Hypokalemia  - oral replacement given - recheck in AM   DVT prophylaxis: rivaroxaban   Code Status: Full   Family Communication: wife at bedside   Disposition Plan: transfer to [facility] for MRI   Consults called: neurology  Admission status: OBS  Level of care: Telemetry Medical Standley Dakins MD [facility] How to contact the [facility] Attending or Consulting provider 7A - 7P or covering provider during after hours 7P -7A, for this patient?  Check the care team in [facility] and look for a) attending/consulting [facility] provider listed and b) the [facility] team listed Log into [email or web address] and use 's universal password to access. If you do not have the password, please contact the hospital operator. Locate the [facility] provider you are looking for under [facility] and page to a number that you can be directly reached. If you still have difficulty reaching the provider, please page the [facility] (Director on Call) for the Hospitalists listed on amion for assistance.   If 7PM-7AM, please contact night-coverage [email or web address] Password [facility]  11/24/2022, 4:05 PM

## 2022-11-24 NOTE — ED Provider Notes (Signed)
[place] EMERGENCY DEPARTMENT AT [facility] Provider Note   CSN: [id number] Arrival date & time: 11/24/22  1241     History {Add pertinent medical, surgical, social history, OB history to HPI:1} Chief Complaint  Patient presents with   Code Stroke    Casey [name] is a 77 y.o. male with Afib on xarelto, HTN, HLD, OSA, bipolar disorder, GERD, renal insufficiency who presents with code stroke by EMS.  Patient was walking at church in his [facility] when he acutely developed L sided weakness at 12:10 pm. Did not fall or hit his head. No numbness/tingling, speech difficulties, facial droop. EMS noted L arm drift/grip weakness, called code stroke. Takes xarelto for Afib. No h/o CVA/TIA.  Teleneurology evaluating at bedside on patient arrival.  HPI     Home Medications Prior to Admission medications   Medication Sig Start Date End Date Taking? Authorizing Provider  acetaminophen (TYLENOL) 500 MG tablet Take 1,000 mg by mouth every 6 (six) hours as needed for headache.    [provider]  cholecalciferol (VITAMIN D) 1000 UNITS tablet Take 2,000 Units by mouth daily.    [provider]  furosemide (LASIX) 20 MG tablet Take 20 mg by mouth daily as needed for edema.    [provider]  levothyroxine (SYNTHROID, LEVOTHROID) 25 MCG tablet Take 25 mcg by mouth daily before breakfast. 04/06/15   [provider]  metoprolol succinate (TOPROL-XL) 25 MG 24 hr tablet Take 1 tablet (25 mg total) by mouth daily. 08/21/22   Croitoru, Mihai, MD  OLANZapine (ZYPREXA) 5 MG tablet TAKE 1 TABLET BY MOUTH AT BEDTIME 11/03/22   Cottle, Steva Ready., MD  pantoprazole (PROTONIX) 40 MG tablet TAKE ONE TABLET BY MOUTH ONCE DAILY 03/07/17   Anice Paganini, NP  pramipexole (MIRAPEX) 0.25 MG tablet 1 twice daily for 4 days then 1 and 1/2 tablets twice daily 10/14/22   Cottle, Steva Ready., MD  rivaroxaban (XARELTO) 20 MG TABS tablet Take 20 mg by mouth daily after supper.      [provider]  SUPER B COMPLEX/C PO Take 1 tablet by mouth daily.    [provider]      Allergies    Amlodipine and Vicodin [hydrocodone-acetaminophen]    Review of Systems   Review of Systems Review of systems Negative for fall, LOC.  A 10 point review of systems was performed and is negative unless otherwise reported in HPI.  Physical Exam Updated Vital Signs BP (!) 177/107 (BP Location: Left Arm)   Pulse 73   Temp 97.6 F (36.4 C) (Oral)   Resp 16   Ht 6' (1.829 m)   Wt 96.5 kg   SpO2 98%   BMI 28.85 kg/m  Physical Exam General: Normal appearing male, lying in bed.  HEENT: PERRLA, EOMI, no nystagmus, Sclera anicteric, MMM, trachea midline.Tongue protrudes midline.  Cardiology: RRR, no murmurs/rubs/gallops. BL radial and DP pulses equal bilaterally.  Resp: Normal respiratory rate and effort. CTAB, no wheezes, rhonchi, crackles.  Abd: Soft, non-tender, non-distended. No rebound tenderness or guarding.  GU: Deferred. MSK: No peripheral edema or signs of trauma. Extremities without deformity or TTP. No cyanosis or clubbing. Skin: warm, dry. No rashes or lesions. Back: No CVA tenderness Neuro: A&Ox4, CNs II-XII grossly intact. MAEs. Sensation grossly intact. Normal speech. Psych: Normal mood and affect.   1a  Level of consciousness: 0=alert; keenly responsive  1b. LOC questions:  0=Performs both tasks correctly  1c. LOC commands: 0=Performs  both tasks correctly  2.  Best Gaze: 0=normal  3.  Visual: 0=No visual loss  4. Facial Palsy: 0=Normal symmetric movement  5a.  Motor left arm: 0=No drift, limb holds 90 (or 45) degrees for full 10 seconds  5b.  Motor right arm: 0=No drift, limb holds 90 (or 45) degrees for full 10 seconds  6a. motor left leg: 0=No drift, limb holds 90 (or 45) degrees for full 10 seconds  6b  Motor right leg:  0=No drift, limb holds 90 (or 45) degrees for full 10 seconds  7. Limb Ataxia: 0=Absent  8.  Sensory: 0=Normal; no sensory  loss  9. Best Language:  0=No aphasia, normal  10. Dysarthria: 0=Normal  11. Extinction and Inattention: 0=No abnormality   Total:   0        ED Results / Procedures / Treatments   Labs (all labs ordered are listed, but only abnormal results are displayed) Labs Reviewed  PROTIME-INR - Abnormal; Notable for the following components:      Result Value   Prothrombin Time 17.9 (*)    INR 1.5 (*)    All other components within normal limits  APTT - Abnormal; Notable for the following components:   aPTT 38 (*)    All other components within normal limits  COMPREHENSIVE METABOLIC PANEL - Abnormal; Notable for the following components:   Glucose, Bld 103 (*)    Creatinine, Ser 1.38 (*)    Calcium 8.8 (*)    GFR, Estimated 53 (*)    All other components within normal limits  CBG MONITORING, ED - Abnormal; Notable for the following components:   Glucose-Capillary 102 (*)    All other components within normal limits  ETHANOL  CBC  DIFFERENTIAL  RAPID URINE DRUG SCREEN, HOSP PERFORMED  URINALYSIS, ROUTINE W REFLEX MICROSCOPIC  I-STAT CHEM 8, ED    EKG None  Radiology CT HEAD CODE STROKE WO CONTRAST  Result Date: 11/24/2022 CLINICAL DATA:  Code stroke. Neuro deficit, acute, stroke suspected. Left-sided weakness. EXAM: CT HEAD WITHOUT CONTRAST TECHNIQUE: Contiguous axial images were obtained from the base of the skull through the vertex without intravenous contrast. RADIATION DOSE REDUCTION: This exam was performed according to the departmental dose-optimization program which includes automated exposure control, adjustment of the mA and/or kV according to patient size and/or use of iterative reconstruction technique. COMPARISON:  Head CT 11/02/2018 FINDINGS: Brain: Generalized parenchymal atrophy. Commensurate prominence of the ventricles and sulci. Patchy and ill-defined hypoattenuation within the cerebral white matter, nonspecific but compatible with mild chronic small vessel  ischemic disease. There is no acute intracranial hemorrhage. No demarcated cortical infarct. No extra-axial fluid collection. No evidence of an intracranial mass. No midline shift. Vascular: No hyperdense vessel. Atherosclerotic calcifications. Skull: No fracture or aggressive osseous lesion. Sinuses/Orbits: No mass or acute finding within the imaged orbits. No significant paranasal sinus disease at the imaged levels. ASPECTS [facility] Stroke Program Early CT Score) - Ganglionic level infarction (caudate, lentiform nuclei, internal capsule, insula, M1-M3 cortex): 7 - Supraganglionic infarction (M4-M6 cortex): 3 Total score (0-10 with 10 being normal): 10 No evidence of an acute intracranial abnormality. These results were called by telephone at the time of interpretation on 11/24/2022 at 1:07 pm to provider Dr. Jearld Fenton, who verbally acknowledged these results. IMPRESSION: 1.  No evidence of an acute intracranial abnormality. 2. Parenchymal atrophy and chronic small vessel ischemic disease. Electronically Signed   By: Jackey Loge D.O.   On: 11/24/2022 13:07    Procedures Procedures  {  Document cardiac monitor, telemetry assessment procedure when appropriate:1}  Medications Ordered in ED Medications - No data to display  ED Course/ Medical Decision Making/ A&P                          Medical Decision Making   This patient presents to the ED for concern of left-sided weakness, this involves an extensive number of treatment options, and is a complaint that carries with it a high risk of complications and morbidity.  I considered the following differential and admission for this acute, potentially life threatening condition.   MDM:    Given the acute onset of neurological symptoms, stroke is the most concerning etiology of these acute symptoms. The neuro exam is significant for no abnormalities at this time.   Neurology team evaluating patient at bedside. Plan to obtain emergent CT brain.  LKN: 12:10  PM Glucose: 102 mg/dL AC: Xarelto BP: 119/147  Clinical Course as of 11/24/22 1326  Sun Nov 24, 2022  1324 INR(!): 1.5 On Xarelto [HN]  1325 Creatinine(!): 1.38 Known CKD [HN]  1325 WBC: 7.2 [HN]  1325 Hemoglobin: 14.2 [HN]  1325 Sodium: 138 [HN]  1325 Potassium: 3.9 [HN]  1325 Alcohol, Ethyl (B): <10 [HN]  1325 Glucose-Capillary(!): 102 [HN]  1325 CT HEAD CODE STROKE WO CONTRAST 1.  No evidence of an acute intracranial abnormality. 2. Parenchymal atrophy and chronic small vessel ischemic disease.   [HN]  1325 D/w Dr. Selina Cooley. She states NIHSS 3, recommends MRI and admission for stroke w/u.  [HN]    Clinical Course User Index [HN] Loetta Rough, MD    Labs: I Ordered, and personally interpreted labs.  The pertinent results include:  those listed above  Imaging Studies ordered: I ordered imaging studies including CTH I independently visualized and interpreted imaging. I agree with the radiologist interpretation  Additional history obtained from EMS, chart review, wife at bedside.   Cardiac Monitoring: The patient was maintained on a cardiac monitor.  I personally viewed and interpreted the cardiac monitored which showed an underlying rhythm of: Afib rate controlled  Reevaluation: After the interventions noted above, I reevaluated the patient and found that they have :improved  Social Determinants of Health: Patient lives independently   Disposition:  Admit for stroke w/u, MRI brain ordered  Co morbidities that complicate the patient evaluation  Past Medical History:  Diagnosis Date   Arthritis    Asthma    Atrial fibrillation (HCC) 01/12/2010   2D Echo EF=>55%   Bipolar disorder (HCC)    Chronic back pain    Depression    Dysrhythmia    AFib   GERD (gastroesophageal reflux disease)    History of colonic polyps    History of gout    Hyperlipidemia    Hypertension    Hypothyroidism    Morbid obesity (HCC)    OSA (obstructive sleep apnea)    AHI was  27.62hr RDI was 34.3 hr REM 0.00hr; had CPAP years ago but no longer uses.   Prostatitis    Tubular adenoma of colon 01/2016     Medicines No orders of the defined types were placed in this encounter.   I have reviewed the patients home medicines and have made adjustments as needed  Problem List / ED Course: Problem List Items Addressed This Visit   None        {Document critical care time when appropriate:1} {Document review of labs and clinical decision tools  ie heart score, Chads2Vasc2 etc:1}  {Document your independent review of radiology images, and any outside records:1} {Document your discussion with family members, caretakers, and with consultants:1} {Document social determinants of health affecting pt's care:1} {Document your decision making why or why not admission, treatments were needed:1}  This note was created using dictation software, which may contain spelling or grammatical errors.

## 2022-11-24 NOTE — Consult Note (Signed)
1241 code stroke cart activated.  ED provider at bedside 1245 Dr Selina Cooley paged [phone] Dr Selina Cooley on screen 1448 pt to CT MRS 1

## 2022-11-24 NOTE — Progress Notes (Signed)
Code Stroke time documentation  1234 Call time 1236 Beeper time 1250 Exam started 1254 Exam finished 1259 Exam completed in [facility] Radiology called

## 2022-11-25 ENCOUNTER — Observation Stay (HOSPITAL_BASED_OUTPATIENT_CLINIC_OR_DEPARTMENT_OTHER): Payer: Medicare Other

## 2022-11-25 ENCOUNTER — Observation Stay (HOSPITAL_COMMUNITY): Payer: Medicare Other

## 2022-11-25 DIAGNOSIS — I482 Chronic atrial fibrillation, unspecified: Secondary | ICD-10-CM | POA: Diagnosis not present

## 2022-11-25 DIAGNOSIS — I639 Cerebral infarction, unspecified: Secondary | ICD-10-CM | POA: Diagnosis not present

## 2022-11-25 DIAGNOSIS — F319 Bipolar disorder, unspecified: Secondary | ICD-10-CM

## 2022-11-25 DIAGNOSIS — I4821 Permanent atrial fibrillation: Secondary | ICD-10-CM | POA: Diagnosis not present

## 2022-11-25 DIAGNOSIS — I6389 Other cerebral infarction: Secondary | ICD-10-CM | POA: Diagnosis not present

## 2022-11-25 LAB — LIPID PANEL
Cholesterol: 163 mg/dL (ref 0–200)
HDL: 48 mg/dL (ref >40)
LDL Cholesterol: 100 mg/dL — ABNORMAL HIGH (ref 0–99)
Total CHOL/HDL Ratio: 3.4 RATIO
Triglycerides: 73 mg/dL (ref <150)
VLDL: 15 mg/dL (ref 0–40)

## 2022-11-25 LAB — ECHOCARDIOGRAM COMPLETE
Area-P 1/2: 3.24 cm2
Calc EF: 60.3 %
Height: 72 in
S' Lateral: 2.7 cm
Single Plane A2C EF: 61.2 %
Single Plane A4C EF: 57.6 %
Weight: 3403.9 oz

## 2022-11-25 LAB — TSH: TSH: 2.288 u[IU]/mL (ref 0.350–4.500)

## 2022-11-25 MED ORDER — ROSUVASTATIN CALCIUM 20 MG PO TABS
20.0000 mg | ORAL_TABLET | Freq: Every day | ORAL | Status: DC
  Administered 2022-11-25 – 2022-11-26 (×2): 20 mg via ORAL
  Filled 2022-11-25 (×2): qty 1

## 2022-11-25 MED ORDER — APIXABAN 5 MG PO TABS: 5.0000 mg | ORAL_TABLET | Freq: Two times a day (BID) | ORAL | Status: DC

## 2022-11-25 MED ORDER — LACTATED RINGERS IV SOLN: INTRAVENOUS | Status: AC

## 2022-11-25 MED ORDER — PRAMIPEXOLE DIHYDROCHLORIDE 0.25 MG PO TABS
0.3750 mg | ORAL_TABLET | Freq: Two times a day (BID) | ORAL | Status: DC
  Administered 2022-11-25 – 2022-11-26 (×2): 0.375 mg via ORAL
  Filled 2022-11-25 (×3): qty 1

## 2022-11-25 MED ORDER — RIVAROXABAN 20 MG PO TABS
20.0000 mg | ORAL_TABLET | Freq: Every day | ORAL | Status: DC
  Administered 2022-11-25: 20 mg via ORAL
  Filled 2022-11-25: qty 1

## 2022-11-25 NOTE — TOC Initial Note (Signed)
Transition of Care [facility]) - Initial/Assessment Note    Patient Details  Name: Casey [name] MRN: [id number] Date of Birth: [birth year]  Transition of Care [facility]) CM/SW Contact:    Gordy Clement, RN Phone Number: 11/25/2022, 3:29 PM  Clinical Narrative:     Patient comes from home with Wife via EMS with acute left sided weakness and facial droop . He first went to [facility] then transferred here for MRI.   OP PT and OT are recommended and referrals have been made by TOC.   TOC will continue to follow patient for any additional discharge needs                  Expected Discharge Plan: OP Rehab Barriers to Discharge: Continued Medical Work up   Patient Goals and CMS Choice            Expected Discharge Plan and Services   Discharge Planning Services: Medication Assistance, CM Consult Post Acute Care Choice:  (OP PT/OT) Living arrangements for the past 2 months: Single Family Home                 DME Arranged: N/A                    Prior Living Arrangements/Services Living arrangements for the past 2 months: Single Family Home Lives with:: Spouse Patient language and need for interpreter reviewed:: Yes Do you feel safe going back to the place where you live?: Yes      Need for Family Participation in Patient Care: No (Comment) Care giver support system in place?: Yes (comment)   Criminal Activity/Legal Involvement Pertinent to Current Situation/Hospitalization: No - Comment as needed  Activities of Daily Living Home Assistive Devices/Equipment: None ADL Screening (condition at time of admission) Patient's cognitive ability adequate to safely complete daily activities?: Yes Is the patient deaf or have difficulty hearing?: No Does the patient have difficulty seeing, even when wearing glasses/contacts?: No Does the patient have difficulty concentrating, remembering, or making decisions?: No Patient able to express need for assistance with ADLs?: Yes Does the  patient have difficulty dressing or bathing?: No Independently performs ADLs?: Yes (appropriate for developmental age) Does the patient have difficulty walking or climbing stairs?: No Weakness of Legs: Both Weakness of Arms/Hands: None  Permission Sought/Granted Permission sought to share information with : Case Manager Permission granted to share information with : Yes, Verbal Permission Granted     Permission granted to share info w AGENCY: OPPT/OT        Emotional Assessment              Admission diagnosis:  Acute CVA (cerebrovascular accident) [facility]) [I63.9] Patient Active Problem List   Diagnosis Date Noted   Acute CVA (cerebrovascular accident) (HCC) 11/24/2022   Pain in left shoulder 02/21/2021   Constipation 12/28/2018   N&V (nausea and vomiting) 12/28/2018   GERD (gastroesophageal reflux disease) 12/28/2018   Abdominal pain 12/28/2018   Weight loss, unintentional 12/28/2018   Atrial fibrillation with RVR (HCC)    Sepsis (HCC) 06/13/2018   Acute metabolic encephalopathy 06/13/2018   Thrombocytopenia (HCC) 06/13/2018   Renal insufficiency 06/13/2018   Anxiety 06/12/2018   Bipolar disorder (HCC) 06/12/2018   Chronic anticoagulation 05/05/2017   Sleep apnea 05/05/2017   Edema leg 05/05/2017   Dysphasia 02/07/2017   Mild obesity 05/10/2016   History of adenomatous polyp of colon    Diverticulosis of colon without hemorrhage    Hyperlipidemia LDL  goal <130 05/16/2013   KNEE PAIN 08/07/2009   KNEE SPRAIN, ACUTE 08/07/2009   COLONIC POLYPS, HX OF 05/10/2009   Hypothyroidism 05/08/2009   History of bipolar disorder 05/08/2009   Essential hypertension 05/08/2009   Permanent atrial fibrillation 05/08/2009   HEMATOCHEZIA 05/08/2009   LOW BACK PAIN, CHRONIC 05/08/2009   ABDOMINAL PAIN 05/08/2009   PCP:  Assunta Found, MD Pharmacy:   [facility] [address] [place] [address] [place] [address] [place] [address] Phone: [phone] Fax:  [phone]     Social Determinants of Health (SDOH) Social History: SDOH Screenings   Food Insecurity: No Food Insecurity (11/24/2022)  Housing: Low Risk  (11/24/2022)  Transportation Needs: No Transportation Needs (11/24/2022)  Utilities: Not At Risk (11/24/2022)  Tobacco Use: Low Risk  (11/24/2022)   SDOH Interventions:     Readmission Risk Interventions     No data to display

## 2022-11-25 NOTE — Evaluation (Signed)
Occupational Therapy Evaluation Patient Details Name: Casey [name] MRN: [id number] DOB: 06-28-1946 Today's Date: 11/25/2022   History of Present Illness NERMIN BIELAK is a 77 year old male who presents to ED with acute left-sided weakness. CT head unremarkable. Pt transferred to [facility] for MRI. MRI findings: Punctate focus of acute/early subacute ischemia in the right frontal white matter. PMHx: permanent atrial fibrillation, OSA, bipolar disorder, mild tardive dyskinesia (from olanzepine), RLS, CKD, GERD, bipolar depression with h/o pseudodementia with depression, hypertension and hyperlipidemia.   Clinical Impression   Ahmi was evaluated s/p the above admission list. Pt reports being mod I with intermittent use of DME at baseline. During session, pt required supervision assist for functional transfers and mobility without use of AD. Pt noted to have deficits with problem-solving skills, short-term memory, and safety/judgement, and executive functioning. Pt would benefit from continued skilled OT services for a formal cognitive assessment and OP OT services to assess neuro cognition.     Recommendations for follow up therapy are one component of a multi-disciplinary discharge planning process, led by the attending physician.  Recommendations may be updated based on patient status, additional functional criteria and insurance authorization.   Assistance Recommended at Discharge Intermittent Supervision/Assistance  Patient can return home with the following Direct supervision/assist for medications management;Direct supervision/assist for financial management;Assist for transportation    Functional Status Assessment  Patient has had a recent decline in their functional status and demonstrates the ability to make significant improvements in function in a reasonable and predictable amount of time.  Equipment Recommendations  None recommended by OT (Pt owns recommended equipment)        Precautions / Restrictions Precautions Precautions: Fall Restrictions Weight Bearing Restrictions: No      Mobility Bed Mobility               General bed mobility comments: OOB upon arrival, returned to recliner    Transfers Overall transfer level: Needs assistance Equipment used: None Transfers: Sit to/from Stand Sit to Stand: Supervision           General transfer comment: Pt mod I for functional transfers and mobility without use of AD, supervision provided this date fro safety only.      Balance Overall balance assessment: No apparent balance deficits (not formally assessed)                                         ADL either performed or assessed with clinical judgement   ADL Overall ADL's : At baseline           General ADL Comments: Pt reports that he is at his functional baseline for ADLs and mobility. Generalized superivsion A proivded for ADLs and functional mobility this date - pt reports he requires assist for LB ADLs at baseline.     Vision Baseline Vision/History: 1 Wears glasses Ability to See in Adequate Light: 0 Adequate Patient Visual Report: No change from baseline (Wears glasses for distance and reading) Vision Assessment?: No apparent visual deficits Additional Comments: did not formally assess     Perception Perception Perception Tested?: No   Praxis Praxis Praxis tested?: Not tested    Pertinent Vitals/Pain Pain Assessment Pain Assessment: No/denies pain     Hand Dominance Right   Extremity/Trunk Assessment Upper Extremity Assessment Upper Extremity Assessment: Overall WFL for tasks assessed   Lower Extremity Assessment Lower Extremity Assessment: Defer to PT  evaluation   Cervical / Trunk Assessment Cervical / Trunk Assessment: Normal   Communication Communication Communication: Expressive difficulties (slurred speech noted)   Cognition Arousal/Alertness: Awake/alert Behavior During Therapy: WFL  for tasks assessed/performed Overall Cognitive Status: Impaired/Different from baseline Area of Impairment: Problem solving, Safety/judgement, Memory                     Memory: Decreased short-term memory   Safety/Judgement: Decreased awareness of deficits   Problem Solving: Slow processing, Requires verbal cues General Comments: Pt required increased time to recall PLOF and AE used. Pt required increased time and verbal cues to recall functional deficits relating to safely returning home. Pt noted to have deficits with self awareness and it's impact on returning back home.     General Comments  VSS on RA, tele leads replaced at the end of the session            Home Living Family/patient expects to be discharged to:: Private residence Living Arrangements: Spouse/significant other Available Help at Discharge: Family (wife retired and IT trainer) Type of Home: House Home Access: Ramped entrance     Home Layout: Two level;Bed/bath upstairs;Able to live on main level with bedroom/bathroom Alternate Level Stairs-Number of Steps: flight   Bathroom Shower/Tub: Chief Strategy Officer: Handicapped height Bathroom Accessibility: Yes How Accessible: Accessible via walker Home Equipment: Rollator (4 wheels);Cane - single point;Shower seat - built in;Grab bars - toilet          Prior Functioning/Environment Prior Level of Function : Independent/Modified Independent;Driving             Mobility Comments: Pt reports previous use of rollator and cane for mobility, denies recent use of AD for mobility. ADLs Comments: Pt reports independence with ADL/IADLs. Pt reports wife assists with donning/doffing socks and shoes at baseline.        OT Problem List: Decreased cognition;Decreased safety awareness;Decreased activity tolerance;Decreased knowledge of precautions      OT Treatment/Interventions: Self-care/ADL training;Energy conservation;DME and/or AE  instruction;Cognitive remediation/compensation;Patient/family education;Therapeutic activities    OT Goals(Current goals can be found in the care plan section) Acute Rehab OT Goals Patient Stated Goal: to go home OT Goal Formulation: With patient Time For Goal Achievement: 12/09/22 Potential to Achieve Goals: Good  OT Frequency: Min 1X/week       AM-PAC OT "6 Clicks" Daily Activity     Outcome Measure Help from another person eating meals?: None Help from another person taking care of personal grooming?: A Little Help from another person toileting, which includes using toliet, bedpan, or urinal?: A Little Help from another person bathing (including washing, rinsing, drying)?: A Little Help from another person to put on and taking off regular upper body clothing?: None Help from another person to put on and taking off regular lower body clothing?: A Little 6 Click Score: 20   End of Session Equipment Utilized During Treatment: Other (comment) (None) Nurse Communication: Mobility status  Activity Tolerance: Patient tolerated treatment well Patient left: in chair;with call bell/phone within reach;with chair alarm set  OT Visit Diagnosis: Other symptoms and signs involving cognitive function;Cognitive communication deficit (R41.841) Symptoms and signs involving cognitive functions: Cerebral infarction                Time: 0981-1914 OT Time Calculation (min): 21 min Charges:  OT General Charges $OT Visit: 1 Visit OT Evaluation $OT Eval Moderate Complexity: 1 Mod  Sherley Bounds, OTS   Sherley Bounds 11/25/2022, 11:19 AM

## 2022-11-25 NOTE — Plan of Care (Signed)

## 2022-11-25 NOTE — Progress Notes (Signed)
PROGRESS NOTE                                                                                                                                                                                                             Patient Demographics:    Casey [name], is a 77 y.o. male, DOB - [birth year], WUJ:[id number]  Outpatient Primary MD for the patient is Assunta Found, MD    LOS - 0  Admit date - 11/24/2022    Chief Complaint  Patient presents with   Code Stroke       Brief Narrative (HPI from H&P)    77 year old gentleman with history of permanent atrial fibrillation on rivaroxaban, OSA, bipolar disorder medically managed, mild tardive dyskinesia (from olanzepine), RLS, CKD, GERD, bipolar depression with h/o pseudodementia with depression, hypertension, hyperlipidemia who was in church today with his wife and while ambulating he developed acute left-sided weakness around noon, he presented to [facility], ER from where he was transferred to [facility] and diagnosed with acute stroke.   Subjective:    [name] today has, No headache, No chest pain, No abdominal pain - No Nausea, No new weakness tingling or numbness, no SOB   Assessment  & Plan :   Left-sided weakness, left-sided facial droop in a patient with acute right frontal ischemic infarcts.  Could be due to underlying A-fib although he is already on Xarelto and compliant with it, secondary risk factors for stroke will be checked, stroke workup per neuro team.  A1c is stable, LDL, echo, carotids pending.  Continue Xarelto and monitor.  Symptoms have improved.   History of paroxysmal atrial fibrillation [place] vas 2 score of greater than 5.  Continue Xarelto, Toprol-XL held as he had events of severe bradycardia at night with heart rate dropping into mid 30s, cardiology has been consulted, hold beta-blocker check TSH.  Telemetry monitor.   AKI.  Hydrate and  monitor.  Hypertension.  Permissive hypertension due to CVA.  OSA.  Nighttime CPAP.  Dyslipidemia.  Not on statin currently LDL pending.  GERD.  On PPI.       Condition - Extremely Guarded  Family Communication  :  called wife [name] [phone] on 11/25/2022 at 7:48 AM detailed message left  Code Status :  Full  Consults  :  Neuro, Cards  PUD Prophylaxis :  PPI   Procedures  :     MRI - 1. Punctate focus of acute/early subacute ischemia in the right frontal white matter. No hemorrhage or mass effect. 2. Chronic small vessel ischemia and volume loss  TTE  Carotid US -       Disposition Plan  :    Status is: Observation  DVT Prophylaxis  :     rivaroxaban (XARELTO) tablet 20 mg     Lab Results  Component Value Date   PLT 153 11/24/2022    Diet :  Diet Order             DIET DYS 3 Fluid consistency: Thin  Diet effective now                    Inpatient Medications  Scheduled Meds:   stroke: early stages of recovery book   Does not apply Once   buPROPion  300 mg Oral Q breakfast   levothyroxine  25 mcg Oral Q0600   OLANZapine  5 mg Oral QHS   pantoprazole  40 mg Oral Daily   pramipexole  0.375 mg Oral TID   rivaroxaban  20 mg Oral QPC supper   Continuous Infusions:  sodium chloride 50 mL/hr at 11/25/22 0012   PRN Meds:.acetaminophen **OR** acetaminophen (TYLENOL) oral liquid 160 mg/5 mL **OR** acetaminophen, senna-docusate  Antibiotics  :    Anti-infectives (From admission, onward)    None         Objective:   Vitals:   11/24/22 2157 11/25/22 0013 11/25/22 0350 11/25/22 0400  BP: (!) 155/98 (!) 147/95 (!) 154/82 (!) 154/103  Pulse: 80 69 68 70  Resp: 18 16 16 16   Temp: 98.8 F (37.1 C) 98.4 F (36.9 C) 98 F (36.7 C) 98.2 F (36.8 C)  TempSrc: Oral Oral Oral Oral  SpO2: 96% 97% 95% 96%  Weight:      Height:        Wt Readings from Last 3 Encounters:  11/24/22 96.5 kg  08/21/22 96.5 kg  03/13/22 101.2 kg      Intake/Output Summary (Last 24 hours) at 11/25/2022 0748 Last data filed at 11/25/2022 0606 Gross per 24 hour  Intake 0 ml  Output 100 ml  Net -100 ml     Physical Exam  Awake Alert, No new F.N deficits, Normal affect. Mild L facial droop [place].AT,PERRAL Supple Neck, No JVD,   Symmetrical Chest wall movement, Good air movement bilaterally, CTAB RRR,No Gallops,Rubs or new Murmurs,  +ve B.Sounds, Abd Soft, No tenderness,   No Cyanosis, Clubbing or edema       Data Review:    Recent Labs  Lab 11/24/22 1252 11/24/22 1433  WBC 7.2 7.3  HGB 14.2 14.2  HCT 42.2 42.1  PLT 162 153  MCV 94.6 95.2  MCH 31.8 32.1  MCHC 33.6 33.7  RDW 13.2 13.2  LYMPHSABS 0.9  --   MONOABS 0.5  --   EOSABS 0.2  --   BASOSABS 0.0  --     Recent Labs  Lab 11/24/22 1252  NA 138  K 3.9  CL 104  CO2 26  ANIONGAP 8  GLUCOSE 103*  BUN 21  CREATININE 1.38*  AST 18  ALT 13  ALKPHOS 70  BILITOT 0.8  ALBUMIN 3.9  INR 1.5*  HGBA1C 5.2  CALCIUM 8.8*      Recent Labs  Lab 11/24/22 1252  INR 1.5*  HGBA1C 5.2  CALCIUM 8.8*  Recent Labs  Lab 11/24/22 1252 11/24/22 1433  WBC 7.2 7.3  PLT 162 153  CREATININE 1.38*  --     ------------------------------------------------------------------------------------------------------------------ No results found for: "CHOL", "HDL", "LDLCALC", "LDLDIRECT", "TRIG", "CHOLHDL"  Lab Results  Component Value Date   HGBA1C 5.2 11/24/2022    No results for input(s): "TSH", "T4TOTAL", "T3FREE", "THYROIDAB" in the last 72 hours.  Invalid input(s): "FREET3" ------------------------------------------------------------------------------------------------------------------ Cardiac Enzymes No results for input(s): "CKMB", "TROPONINI", "MYOGLOBIN" in the last 168 hours.  Invalid input(s): "CK"  Micro Results No results found for this or any previous visit (from the past 240 hour(s)).  Radiology Reports MR BRAIN WO CONTRAST  Result  Date: 11/24/2022 CLINICAL DATA:  Transient ischemic attack EXAM: MRI HEAD WITHOUT CONTRAST TECHNIQUE: Multiplanar, multiecho pulse sequences of the brain and surrounding structures were obtained without intravenous contrast. COMPARISON:  12/27/2016 FINDINGS: Brain: Punctate focus of acute/early subacute ischemia in the right frontal white matter. No acute or chronic hemorrhage. There is multifocal hyperintense T2-weighted signal within the white matter. Generalized volume loss. The midline structures are normal. Vascular: Major flow voids are preserved. Skull and upper cervical spine: Normal calvarium and skull base. Visualized upper cervical spine and soft tissues are normal. Sinuses/Orbits:No paranasal sinus fluid levels or advanced mucosal thickening. No mastoid or middle ear effusion. Normal orbits. IMPRESSION: 1. Punctate focus of acute/early subacute ischemia in the right frontal white matter. No hemorrhage or mass effect. 2. Chronic small vessel ischemia and volume loss. Electronically Signed   By: Deatra Robinson M.D.   On: 11/24/2022 23:03   CT HEAD CODE STROKE WO CONTRAST  Result Date: 11/24/2022 CLINICAL DATA:  Code stroke. Neuro deficit, acute, stroke suspected. Left-sided weakness. EXAM: CT HEAD WITHOUT CONTRAST TECHNIQUE: Contiguous axial images were obtained from the base of the skull through the vertex without intravenous contrast. RADIATION DOSE REDUCTION: This exam was performed according to the departmental dose-optimization program which includes automated exposure control, adjustment of the mA and/or kV according to patient size and/or use of iterative reconstruction technique. COMPARISON:  Head CT 11/02/2018 FINDINGS: Brain: Generalized parenchymal atrophy. Commensurate prominence of the ventricles and sulci. Patchy and ill-defined hypoattenuation within the cerebral white matter, nonspecific but compatible with mild chronic small vessel ischemic disease. There is no acute intracranial  hemorrhage. No demarcated cortical infarct. No extra-axial fluid collection. No evidence of an intracranial mass. No midline shift. Vascular: No hyperdense vessel. Atherosclerotic calcifications. Skull: No fracture or aggressive osseous lesion. Sinuses/Orbits: No mass or acute finding within the imaged orbits. No significant paranasal sinus disease at the imaged levels. ASPECTS [facility] Stroke Program Early CT Score) - Ganglionic level infarction (caudate, lentiform nuclei, internal capsule, insula, M1-M3 cortex): 7 - Supraganglionic infarction (M4-M6 cortex): 3 Total score (0-10 with 10 being normal): 10 No evidence of an acute intracranial abnormality. These results were called by telephone at the time of interpretation on 11/24/2022 at 1:07 pm to provider Dr. Jearld Fenton, who verbally acknowledged these results. IMPRESSION: 1.  No evidence of an acute intracranial abnormality. 2. Parenchymal atrophy and chronic small vessel ischemic disease. Electronically Signed   By: Jackey Loge D.O.   On: 11/24/2022 13:07      Signature  -   Susa Raring M.D on 11/25/2022 at 7:48 AM   -  To page go to [email or web address]

## 2022-11-25 NOTE — Progress Notes (Signed)
VASCULAR LAB    Carotid duplex has been performed.  See CV proc for preliminary results.   Tisheena Maguire, RVT 11/25/2022, 2:06 PM

## 2022-11-25 NOTE — Progress Notes (Addendum)
STROKE TEAM PROGRESS NOTE   INTERVAL HISTORY No family at the bedside.  Patient is sitting in the chair in no apparent distress.  He states yesterday after church he was noted to have a slight facial droop and some left side weakness.  He states all his symptoms have resolved He endorses missing his Xarelto on occasion.  Will change Xarelto to Eliquis MRI brain with small acute right infarct in frontal lobe  Vitals:   11/24/22 2157 11/25/22 0013 11/25/22 0350 11/25/22 0400  BP: (!) 155/98 (!) 147/95 (!) 154/82 (!) 154/103  Pulse: 80 69 68 70  Resp: 18 16 16 16   Temp: 98.8 F (37.1 C) 98.4 F (36.9 C) 98 F (36.7 C) 98.2 F (36.8 C)  TempSrc: Oral Oral Oral Oral  SpO2: 96% 97% 95% 96%  Weight:      Height:       CBC:  Recent Labs  Lab 11/24/22 1252 11/24/22 1433  WBC 7.2 7.3  NEUTROABS 5.5  --   HGB 14.2 14.2  HCT 42.2 42.1  MCV 94.6 95.2  PLT 162 153   Basic Metabolic Panel:  Recent Labs  Lab 11/24/22 1252  NA 138  K 3.9  CL 104  CO2 26  GLUCOSE 103*  BUN 21  CREATININE 1.38*  CALCIUM 8.8*   Lipid Panel:  Recent Labs  Lab 11/25/22 0747  CHOL 163  TRIG 73  HDL 48  CHOLHDL 3.4  VLDL 15  LDLCALC 161*   HgbA1c:  Recent Labs  Lab 11/24/22 1252  HGBA1C 5.2   Urine Drug Screen:  Recent Labs  Lab 11/24/22 1535  LABOPIA NONE DETECTED  COCAINSCRNUR NONE DETECTED  LABBENZ NONE DETECTED  AMPHETMU NONE DETECTED  THCU NONE DETECTED  LABBARB NONE DETECTED    Alcohol Level  Recent Labs  Lab 11/24/22 1252  ETH <10    IMAGING past 24 hours MR ANGIO HEAD WO CONTRAST  Result Date: 11/25/2022 CLINICAL DATA:  Stroke, follow up. EXAM: MRA HEAD WITHOUT CONTRAST TECHNIQUE: Angiographic images of the Circle of Willis were acquired using MRA technique without intravenous contrast. COMPARISON:  CTA head and neck 02/06/2017.  MRA head 12/27/2016. FINDINGS: Anterior circulation: The internal carotid arteries are widely patent from skull base to carotid termini.  ACAs and MCAs are patent without evidence of a proximal branch occlusion or significant proximal stenosis. No aneurysm is identified. Posterior circulation: The included portions of the intracranial vertebral arteries are widely patent to the basilar. The basilar artery is widely patent. Posterior communicating arteries are diminutive or absent. Both PCAs are patent without evidence of a significant proximal stenosis. No aneurysm is identified. Anatomic variants: None. IMPRESSION: Negative head MRA. Electronically Signed   By: Sebastian Ache M.D.   On: 11/25/2022 10:42   MR BRAIN WO CONTRAST  Result Date: 11/24/2022 CLINICAL DATA:  Transient ischemic attack EXAM: MRI HEAD WITHOUT CONTRAST TECHNIQUE: Multiplanar, multiecho pulse sequences of the brain and surrounding structures were obtained without intravenous contrast. COMPARISON:  12/27/2016 FINDINGS: Brain: Punctate focus of acute/early subacute ischemia in the right frontal white matter. No acute or chronic hemorrhage. There is multifocal hyperintense T2-weighted signal within the white matter. Generalized volume loss. The midline structures are normal. Vascular: Major flow voids are preserved. Skull and upper cervical spine: Normal calvarium and skull base. Visualized upper cervical spine and soft tissues are normal. Sinuses/Orbits:No paranasal sinus fluid levels or advanced mucosal thickening. No mastoid or middle ear effusion. Normal orbits. IMPRESSION: 1. Punctate focus of acute/early subacute  ischemia in the right frontal white matter. No hemorrhage or mass effect. 2. Chronic small vessel ischemia and volume loss. Electronically Signed   By: Deatra Robinson M.D.   On: 11/24/2022 23:03    PHYSICAL EXAM  Temp:  [98 F (36.7 C)-98.8 F (37.1 C)] 98.2 F (36.8 C) (05/13 0400) Pulse Rate:  [52-93] 70 (05/13 0400) Resp:  [13-26] 16 (05/13 0400) BP: (140-155)/(54-103) 154/103 (05/13 0400) SpO2:  [95 %-99 %] 96 % (05/13 0400)  General - Well nourished,  well developed, in no apparent distress. Cardiovascular - Regular rhythm and rate.  Mental Status -  Level of arousal and orientation to time, place, and person were intact. Language including expression, naming, repetition, comprehension was assessed and found intact. Attention span and concentration were normal. Recent and remote memory were intact. Fund of Knowledge was assessed and was intact.  Cranial Nerves II - XII - II - Visual field intact OU. III, IV, VI - Extraocular movements intact. V - Facial sensation intact bilaterally. VII -subtle left facial VIII - Hearing & vestibular intact bilaterally. X - Palate elevates symmetrically. XI - Chin turning & shoulder shrug intact bilaterally. XII - Tongue protrusion intact.  Motor Strength - The patient's strength was normal in all extremities and pronator drift was absent.  Bulk was normal and fasciculations were absent.   Motor Tone - Muscle tone was assessed at the neck and appendages and was normal. Sensory - Light touch, temperature/pinprick were assessed and were symmetrical.    Coordination - The patient had normal movements in the hands and feet with no ataxia or dysmetria.  Tremor was absent.  Gait and Station - deferred.  ASSESSMENT/PLAN Mr. Casey [name] is a 77 y.o. 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 LKW 1200.   Stroke: Acute right frontal juxtacortical small infarct, could be from A-fib even on Xarelto Code Stroke CT head No acute abnormality. Small vessel disease. Atrophy. ASPECTS 10.    MRI small acute/subacute right frontal infarct MRA negative Carotid Doppler unremarkable 2D Echo pending LDL 100 HgbA1c 5.2 UDS neg VTE prophylaxis -SCDs Xarelto (rivaroxaban) daily prior to admission, recommend to switch to Eliquis given possible Xarelto failure. (Of note, pt just got 90 day supply for Xarelto, and he and his  wife want to finish off Xarelto first and then switch to Xarelto. I told them that is less ideal but I will respect with their decision.) Therapy recommendations: outpt OT Disposition: Pending  Chronic atrial fibrillation EKG showed afib On Xarelto at home, compliant Given possible Xarelto failure, will switch to eliquis once he finishes his Xarelto Continue to follow up with Dr. Royann Shivers at cardiology  Hypertension Home meds: Metoprolol 12.5 Stable Long-term BP goal normotensive  Hyperlipidemia Home meds: None LDL 100, goal < 70 Add Crestor 20 mg Continue statin at discharge  Other Stroke Risk Factors Advanced Age >/= 62  Obstructive sleep apnea, on CPAP at home  Other Active Problems Hypothyroidism Bipolar GERD  Hospital day # 0  Gevena Mart DNP, ACNPC-AG  [facility]  ATTENDING NOTE: I reviewed above note and agree with the assessment and plan. Pt was seen and examined.   Wife and RN are at the bedside. Pt sitting at the edge of bed, some hard of hearing with mild psychomotor slowing. But otherwise neuro intact. Pt stated that he has left leg weakness for several months due to back pain  but Sunday he developed slurry speech and left facial droop at church but getting better in ED. Now MRI showed right frontal juxtacortical small infarct, concerning for cardioembolic source with Xarelto failure, will switch to eliquis. But pt and wife want to finish 90 day supply of Xarelto first and then switch, I told them it is less ideal but I respect their decision. Put on crestor 20 and will continue on discharge. He will follow up with Dr. Salena Saner at cardiology and will also with [facility] in 4 weeks.   For detailed assessment and plan, please refer to above/below as I have made changes wherever appropriate.   Marvel Plan, MD PhD Stroke Neurology 11/25/2022 2:52 PM  Neurology will sign off. Please call with questions. Pt will follow up with stroke clinic NP at [facility] in about 4  weeks. Thanks for the consult.  To contact Stroke Continuity provider, please refer to [email or web address]. After hours, contact General Neurology

## 2022-11-25 NOTE — Evaluation (Signed)
Physical Therapy Evaluation Patient Details Name: Casey [name] MRN: [id number] DOB: June 07, 1946 Today's Date: 11/25/2022  History of Present Illness  Casey [name] is a 77 year old male who presents to ED with acute left-sided weakness. CT head unremarkable. Pt transferred to [facility] for MRI. MRI findings: Punctate focus of acute/early subacute ischemia in the right frontal white matter. PMHx: permanent atrial fibrillation, OSA, bipolar disorder, mild tardive dyskinesia (from olanzepine), RLS, CKD, GERD, bipolar depression with h/o pseudodementia with depression, hypertension and hyperlipidemia.  Clinical Impression  Pt tolerated today's session well but is mobilizing below his baseline with deficits in balance, LLE sensation and heel to shin coordination, and activity tolerance. Pt ambulated briefly today without AD but noted wider BOS and lateral sway, gait speed and balance improving with RW. Pt ambulating in the hallway but fatiguing and requesting to return back to the room. Acute PT will continue to follow up with pt during admission to progress mobility and address deficits. Pt was independent with mobility at baseline and doesn't have to access steps in his home but does have a full flight, so stair goal added. Acute PT will follow as appropriate, recommend OPPT upon discharge to progress balance and mobility back to baseline.      Recommendations for follow up therapy are one component of a multi-disciplinary discharge planning process, led by the attending physician.  Recommendations may be updated based on patient status, additional functional criteria and insurance authorization.  Follow Up Recommendations       Assistance Recommended at Discharge Intermittent Supervision/Assistance  Patient can return home with the following  A little help with walking and/or transfers;Help with stairs or ramp for entrance;Assist for transportation    Equipment Recommendations None recommended by PT   Recommendations for Other Services       Functional Status Assessment Patient has had a recent decline in their functional status and demonstrates the ability to make significant improvements in function in a reasonable and predictable amount of time.     Precautions / Restrictions Precautions Precautions: Fall Restrictions Weight Bearing Restrictions: No      Mobility  Bed Mobility               General bed mobility comments: pt in chair upon arrival and ended session in chair    Transfers Overall transfer level: Needs assistance Equipment used: None Transfers: Sit to/from Stand Sit to Stand: Supervision           General transfer comment: standing without AD, good power up and supervision provided for safety, good use of arm rests for reaching back when sitting    Ambulation/Gait Ambulation/Gait assistance: Supervision Gait Distance (Feet): 70 Feet (5 feet without AD) Assistive device: Rolling walker (2 wheels), None Gait Pattern/deviations: Step-through pattern, Decreased stride length, Drifts right/left Gait velocity: decreased     General Gait Details: ambulated ~5 feet without AD but noted mild imbalance with lateral sway, improving balance and gait speed with use of RW. Pt tolerating brief trial in the hallway but reports fatiguing faster than usual, no overt LOB  Stairs            Wheelchair Mobility    Modified Rankin (Stroke Patients Only)       Balance Overall balance assessment: Mild deficits observed, not formally tested  Pertinent Vitals/Pain Pain Assessment Pain Assessment: No/denies pain    Home Living Family/patient expects to be discharged to:: Private residence Living Arrangements: Spouse/significant other Available Help at Discharge: Family;Available 24 hours/day Type of Home: House Home Access: Ramped entrance     Alternate Level Stairs-Number of Steps:  14 Home Layout: Two level;Bed/bath upstairs;Able to live on main level with bedroom/bathroom Home Equipment: Rollator (4 wheels);Cane - single point;Shower seat - built in;Grab bars - toilet Additional Comments: wife retired    Prior Function Prior Level of Function : Independent/Modified Independent;Driving             Mobility Comments: independent with all mobility, no AD recently       Hand Dominance   Dominant Hand: Right    Extremity/Trunk Assessment   Upper Extremity Assessment Upper Extremity Assessment: Defer to OT evaluation    Lower Extremity Assessment Lower Extremity Assessment: LLE deficits/detail LLE Deficits / Details: strength grossly equal Bilaterally LLE Sensation: decreased light touch LLE Coordination: decreased gross motor    Cervical / Trunk Assessment Cervical / Trunk Assessment: Normal  Communication   Communication: Expressive difficulties  Cognition Arousal/Alertness: Awake/alert Behavior During Therapy: WFL for tasks assessed/performed Overall Cognitive Status: Impaired/Different from baseline Area of Impairment: Problem solving, Safety/judgement, Memory                     Memory: Decreased short-term memory   Safety/Judgement: Decreased awareness of deficits   Problem Solving: Slow processing, Requires verbal cues General Comments: A&Ox4, increased time for processing and command following with intermittent cueing required for safety        General Comments General comments (skin integrity, edema, etc.): VSS on room air    Exercises     Assessment/Plan    PT Assessment Patient needs continued PT services  PT Problem List Decreased strength;Decreased activity tolerance;Decreased balance;Decreased mobility;Decreased coordination;Decreased cognition;Decreased safety awareness;Impaired sensation       PT Treatment Interventions DME instruction;Gait training;Stair training;Functional mobility training;Therapeutic  activities;Therapeutic exercise;Balance training;Cognitive remediation;Neuromuscular re-education;Patient/family education    PT Goals (Current goals can be found in the Care Plan section)  Acute Rehab PT Goals Patient Stated Goal: go home PT Goal Formulation: With patient Time For Goal Achievement: 12/09/22 Potential to Achieve Goals: Good    Frequency Min 3X/week     Co-evaluation               AM-PAC PT "6 Clicks" Mobility  Outcome Measure Help needed turning from your back to your side while in a flat bed without using bedrails?: None Help needed moving from lying on your back to sitting on the side of a flat bed without using bedrails?: A Little Help needed moving to and from a bed to a chair (including a wheelchair)?: A Little Help needed standing up from a chair using your arms (e.g., wheelchair or bedside chair)?: A Little Help needed to walk in hospital room?: A Little Help needed climbing 3-5 steps with a railing? : A Little 6 Click Score: 19    End of Session Equipment Utilized During Treatment: Gait belt Activity Tolerance: Patient tolerated treatment well Patient left: in chair;with call bell/phone within reach;with chair alarm set Nurse Communication: Mobility status PT Visit Diagnosis: Difficulty in walking, not elsewhere classified (R26.2);Unsteadiness on feet (R26.81)    Time: 1610-9604 PT Time Calculation (min) (ACUTE ONLY): 19 min   Charges:   PT Evaluation $PT Eval Low Complexity: 1 Low          Suzann Lazaro  Shela Commons, PT DPT Acute Rehabilitation Services Office [phone]   Leonie Man 11/25/2022, 3:12 PM

## 2022-11-25 NOTE — Progress Notes (Signed)
Echocardiogram 2D Echocardiogram has been performed.  Toni Amend 11/25/2022, 1:35 PM

## 2022-11-25 NOTE — Consult Note (Addendum)
Cardiology Consultation   Patient ID: Casey [name] MRN: [id number]; DOB: 1945-10-13  Admit date: 11/24/2022 Date of Consult: 11/25/2022  PCP:  Assunta Found, MD   [facility] Providers Cardiologist:  Thurmon Fair, MD  Cardiology APP:  Marcelino Duster, PA  {  Patient Profile:   Casey [name] is a 77 y.o. male with a hx of permanent atrial fibrillation, OSA, and bipolar disorder type I who is being seen 11/25/2022 for the evaluation of CVA in the setting of xarelto at the request of Dr. Thedore Mins.  History of Present Illness:   Casey [name] has a hx of permanent atrial fibrillation without structural heart abnormalities.  He also has OSA and bipolar disorder type I.  He is anticoagulated with Xarelto.  He is noncompliant with his CPAP. He was last seen in clinic with Dr. Royann Shivers 06/13/20 and was doing well at that time. Stress test in 2011 was nonischemic. Echo 2015 with LVEF 55-60%, no WMA, and mild biatrial enlargement.  He was seen by Dr. Royann Shivers 08/21/22 and was doing well.   Unfortunately he presented to [facility] with slurred speech and left sided facial droop after church on 11/24/22. CODE stroke was activated. He was transferred to [facility] for further management. He has no residual deficits. With further discussion with the patient and wife, he is in a depression currently and has been sleeping a lot. This has resulted  in missing doses of medications, including xarelto, and taking it at inconsistent times. He missed two doses 2 weeks ago and with further discussion, he suspects he has missed more this past week.    Past Medical History:  Diagnosis Date   Arthritis    Asthma    Atrial fibrillation (HCC) 01/12/2010   2D Echo EF=>55%   Bipolar disorder (HCC)    Chronic back pain    Depression    Dysrhythmia    AFib   GERD (gastroesophageal reflux disease)    History of colonic polyps    History of gout    Hyperlipidemia    Hypertension    Hypothyroidism    Morbid  obesity (HCC)    OSA (obstructive sleep apnea)    AHI was 27.62hr RDI was 34.3 hr REM 0.00hr; had CPAP years ago but no longer uses.   Prostatitis    Tubular adenoma of colon 01/2016    Past Surgical History:  Procedure Laterality Date   APPENDECTOMY  1959   asthma     CATARACT EXTRACTION Bilateral 1995   COLONOSCOPY  2011   RMR: left-sided diverticula, minimal rectal friability. No polyps. Repeat 5 years.   COLONOSCOPY WITH PROPOFOL N/A 01/15/2016   Dr.Rourk- diverticulosis in the entire examined colon, multiple rectal and colonic polyps, internal hemorrhoids. bx= tubular adenomas and hyperplastic polyps.    COLONOSCOPY WITH PROPOFOL N/A 03/13/2022   Procedure: COLONOSCOPY WITH PROPOFOL;  Surgeon: Corbin Ade, MD;  [facility]: [facility];  Service: Endoscopy;  Laterality: N/A;  11:00am   HAND / FINGER LESION EXCISION Right    IR THORACENTESIS ASP PLEURAL SPACE W/IMG GUIDE  03/01/2019   IR THORACENTESIS ASP PLEURAL SPACE W/IMG GUIDE  03/31/2019   POLYPECTOMY  01/15/2016   Procedure: POLYPECTOMY;  Surgeon: Corbin Ade, MD;  Location: [facility];  Service: Endoscopy;;   POLYPECTOMY  03/13/2022   Procedure: POLYPECTOMY;  Surgeon: Corbin Ade, MD;  Location: [facility];  Service: Endoscopy;;   right shoulder surgery     TONSILLECTOMY  1955  Home Medications:  Prior to Admission medications   Medication Sig Start Date End Date Taking? Authorizing Provider  acetaminophen (TYLENOL) 500 MG tablet Take 1,000 mg by mouth every 6 (six) hours as needed for headache.   Yes [provider]  cholecalciferol (VITAMIN D) 1000 UNITS tablet Take 2,000 Units by mouth daily.   Yes [provider]  levothyroxine (SYNTHROID, LEVOTHROID) 25 MCG tablet Take 25 mcg by mouth daily before breakfast. 04/06/15  Yes [provider]  metoprolol succinate (TOPROL-XL) 25 MG 24 hr tablet Take 1 tablet (25 mg total) by mouth daily. 08/21/22  Yes Margarite Vessel, MD  OLANZapine  (ZYPREXA) 5 MG tablet TAKE 1 TABLET BY MOUTH AT BEDTIME Patient taking differently: Take 5 mg by mouth 2 (two) times daily. 11/03/22  Yes Cottle, Steva Ready., MD  pantoprazole (PROTONIX) 40 MG tablet TAKE ONE TABLET BY MOUTH ONCE DAILY Patient taking differently: Take 40 mg by mouth daily. 03/07/17  Yes Anice Paganini, NP  pramipexole (MIRAPEX) 0.25 MG tablet 1 twice daily for 4 days then 1 and 1/2 tablets twice daily 10/14/22  Yes Cottle, Steva Ready., MD  rivaroxaban (XARELTO) 20 MG TABS tablet Take 20 mg by mouth daily after supper.    Yes [provider]  SUPER B COMPLEX/C PO Take 1 tablet by mouth daily.   Yes [provider]  furosemide (LASIX) 20 MG tablet Take 20 mg by mouth daily as needed for edema. Patient not taking: Reported on 11/25/2022    [provider]    Inpatient Medications: Scheduled Meds:   stroke: early stages of recovery book   Does not apply Once   levothyroxine  25 mcg Oral Q0600   OLANZapine  5 mg Oral QHS   pantoprazole  40 mg Oral Daily   pramipexole  0.375 mg Oral BID   rivaroxaban  20 mg Oral Q supper   rosuvastatin  20 mg Oral Daily   Continuous Infusions:  lactated ringers 75 mL/hr at 11/25/22 1032   PRN Meds: acetaminophen **OR** acetaminophen (TYLENOL) oral liquid 160 mg/5 mL **OR** acetaminophen, senna-docusate  Allergies:    Allergies  Allergen Reactions   Amlodipine Other (See Comments)    Stopped due to kidney issues   Vicodin [Hydrocodone-Acetaminophen] Other (See Comments)    Patient is unsure of reaction    Social History:   Social History   Socioeconomic History   Marital status: Married    Spouse name: Not on file   Number of children: Not on file   Years of education: Not on file   Highest education level: Not on file  Occupational History   Not on file  Tobacco Use   Smoking status: Never   Smokeless tobacco: Never  Substance and Sexual Activity   Alcohol use: No    Alcohol/week: 0.0 standard drinks  of alcohol   Drug use: No   Sexual activity: Yes    Birth control/protection: None  Other Topics Concern   Not on file  Social History Narrative   Not on file   Social Determinants of Health   Financial Resource Strain: Not on file  Food Insecurity: No Food Insecurity (11/24/2022)   Hunger Vital Sign    Worried About Running Out of Food in the Last Year: Never true    Ran Out of Food in the Last Year: Never true  Transportation Needs: No Transportation Needs (11/24/2022)   PRAPARE - Administrator, Civil Service (Medical): No  Lack of Transportation (Non-Medical): No  Physical Activity: Not on file  Stress: Not on file  Social Connections: Not on file  Intimate Partner Violence: Patient Declined (11/24/2022)   Humiliation, Afraid, Rape, and Kick questionnaire    Fear of Current or Ex-Partner: Patient declined    Emotionally Abused: Patient declined    Physically Abused: Patient declined    Sexually Abused: Patient declined    Family History:    Family History  Problem Relation Age of Onset   Diabetes Mother    Heart failure Mother    Hypertension Mother    Hypertension Father    Cancer Paternal Grandmother    Stroke Sister    Liver disease Brother    Vascular Disease Brother    Arthritis Brother    Colon cancer Neg Hx      ROS:  Please see the history of present illness.   All other ROS reviewed and negative.     Physical Exam/Data:   Vitals:   11/24/22 2157 11/25/22 0013 11/25/22 0350 11/25/22 0400  BP: (!) 155/98 (!) 147/95 (!) 154/82 (!) 154/103  Pulse: 80 69 68 70  Resp: 18 16 16 16   Temp: 98.8 F (37.1 C) 98.4 F (36.9 C) 98 F (36.7 C) 98.2 F (36.8 C)  TempSrc: Oral Oral Oral Oral  SpO2: 96% 97% 95% 96%  Weight:      Height:        Intake/Output Summary (Last 24 hours) at 11/25/2022 1647 Last data filed at 11/25/2022 5284 Gross per 24 hour  Intake 0 ml  Output 375 ml  Net -375 ml      11/24/2022   12:47 PM 08/21/2022    8:41  AM 03/13/2022   10:06 AM  Last 3 Weights  Weight (lbs) 212 lb 11.9 oz 212 lb 11.2 oz 223 lb 1.7 oz  Weight (kg) 96.5 kg 96.48 kg 101.2 kg     Body mass index is 28.85 kg/m.  General:  Well nourished, well developed, in no acute distress HEENT: normal Neck: no JVD Vascular: No carotid bruits; Distal pulses 2+ bilaterally Cardiac:  irregular rhythm, regular rate Lungs:  clear to auscultation bilaterally, no wheezing, rhonchi or rales  Abd: soft, nontender, no hepatomegaly  Ext: no edema Musculoskeletal:  No deformities, BUE and BLE strength normal and equal Skin: warm and dry  Neuro:  CNs 2-12 intact, no focal abnormalities noted Psych:  Normal affect   EKG:  The EKG was personally reviewed and demonstrates:  atrial fibrillation with ventricular rate of 80  Telemetry:  Telemetry was personally reviewed and demonstrates:  N/A  Relevant CV Studies:  Echo 11/25/22: 1. Left ventricular ejection fraction, by estimation, is 60 to 65%. The  left ventricle has normal function. The left ventricle has no regional  wall motion abnormalities. Left ventricular diastolic function could not  be evaluated.   2. Right ventricular systolic function is normal. The right ventricular  size is normal.   3. The mitral valve is normal in structure. Trivial mitral valve  regurgitation. No evidence of mitral stenosis.   4. The aortic valve is tricuspid. Aortic valve regurgitation is not  visualized. Aortic valve sclerosis is present, with no evidence of aortic  valve stenosis.   5. The inferior vena cava is normal in size with greater than 50%  respiratory variability, suggesting right atrial pressure of 3 mmHg.   Laboratory Data:  High Sensitivity Troponin:  No results for input(s): "TROPONINIHS" in the last 720 hours.  Chemistry Recent Labs  Lab 11/24/22 1252  NA 138  K 3.9  CL 104  CO2 26  GLUCOSE 103*  BUN 21  CREATININE 1.38*  CALCIUM 8.8*  GFRNONAA 53*  ANIONGAP 8    Recent Labs   Lab 11/24/22 1252  PROT 7.0  ALBUMIN 3.9  AST 18  ALT 13  ALKPHOS 70  BILITOT 0.8   Lipids  Recent Labs  Lab 11/25/22 0747  CHOL 163  TRIG 73  HDL 48  LDLCALC 100*  CHOLHDL 3.4    Hematology Recent Labs  Lab 11/24/22 1252 11/24/22 1433  WBC 7.2 7.3  RBC 4.46 4.42  HGB 14.2 14.2  HCT 42.2 42.1  MCV 94.6 95.2  MCH 31.8 32.1  MCHC 33.6 33.7  RDW 13.2 13.2  PLT 162 153   Thyroid  Recent Labs  Lab 11/25/22 0754  TSH 2.288    BNPNo results for input(s): "BNP", "PROBNP" in the last 168 hours.  DDimer No results for input(s): "DDIMER" in the last 168 hours.   Radiology/Studies:  ECHOCARDIOGRAM COMPLETE  Result Date: 11/25/2022    ECHOCARDIOGRAM REPORT   Patient Name:   Casey [name] Date of Exam: 11/25/2022 Medical Rec #:  [id number]      Height:       72.0 in Accession #:    [id number]     Weight:       212.7 lb Date of Birth:  03-Mar-1946      BSA:          2.187 m Patient Age:    77 years       BP:           154/103 mmHg Patient Gender: M              HR:           84 bpm. Exam Location:  Inpatient Procedure: 2D Echo, Cardiac Doppler and Color Doppler Indications:    stroke  History:        Patient has prior history of Echocardiogram examinations.                 Arrythmias:Atrial Fibrillation; Risk Factors:Hypertension,                 Dyslipidemia and Sleep Apnea.  Sonographer:    Mike Gip Referring Phys: [phone] CLANFORD L JOHNSON IMPRESSIONS  1. Left ventricular ejection fraction, by estimation, is 60 to 65%. The left ventricle has normal function. The left ventricle has no regional wall motion abnormalities. Left ventricular diastolic function could not be evaluated.  2. Right ventricular systolic function is normal. The right ventricular size is normal.  3. The mitral valve is normal in structure. Trivial mitral valve regurgitation. No evidence of mitral stenosis.  4. The aortic valve is tricuspid. Aortic valve regurgitation is not visualized. Aortic valve sclerosis  is present, with no evidence of aortic valve stenosis.  5. The inferior vena cava is normal in size with greater than 50% respiratory variability, suggesting right atrial pressure of 3 mmHg. Comparison(s): No prior Echocardiogram. FINDINGS  Left Ventricle: Left ventricular ejection fraction, by estimation, is 60 to 65%. The left ventricle has normal function. The left ventricle has no regional wall motion abnormalities. The left ventricular internal cavity size was normal in size. There is  no left ventricular hypertrophy. Left ventricular diastolic function could not be evaluated due to atrial fibrillation. Left ventricular diastolic function could not be evaluated. Right Ventricle: The right ventricular size is  normal. Right ventricular systolic function is normal. Left Atrium: Left atrial size was normal in size. Right Atrium: Right atrial size was normal in size. Pericardium: There is no evidence of pericardial effusion. Mitral Valve: The mitral valve is normal in structure. Trivial mitral valve regurgitation. No evidence of mitral valve stenosis. Tricuspid Valve: The tricuspid valve is normal in structure. Tricuspid valve regurgitation is trivial. No evidence of tricuspid stenosis. Aortic Valve: The aortic valve is tricuspid. Aortic valve regurgitation is not visualized. Aortic valve sclerosis is present, with no evidence of aortic valve stenosis. Pulmonic Valve: The pulmonic valve was normal in structure. Pulmonic valve regurgitation is not visualized. No evidence of pulmonic stenosis. Aorta: The aortic root is normal in size and structure. Venous: The inferior vena cava is normal in size with greater than 50% respiratory variability, suggesting right atrial pressure of 3 mmHg. IAS/Shunts: No atrial level shunt detected by color flow Doppler.  LEFT VENTRICLE PLAX 2D LVIDd:         5.00 cm      Diastology LVIDs:         2.70 cm      LV e' medial:    8.49 cm/s LV PW:         1.20 cm      LV E/e' medial:  9.0 LV  IVS:        1.10 cm      LV e' lateral:   11.70 cm/s LVOT diam:     2.25 cm      LV E/e' lateral: 6.6 LV SV:         76 LV SV Index:   35 LVOT Area:     3.98 cm  LV Volumes (MOD) LV vol d, MOD A2C: 105.0 ml LV vol d, MOD A4C: 77.6 ml LV vol s, MOD A2C: 40.7 ml LV vol s, MOD A4C: 32.9 ml LV SV MOD A2C:     64.3 ml LV SV MOD A4C:     77.6 ml LV SV MOD BP:      55.6 ml RIGHT VENTRICLE             IVC RV Basal diam:  4.10 cm     IVC diam: 1.20 cm RV S prime:     12.10 cm/s TAPSE (M-mode): 1.8 cm LEFT ATRIUM             Index        RIGHT ATRIUM           Index LA diam:        4.30 cm 1.97 cm/m   RA Area:     20.00 cm LA Vol (A2C):   72.3 ml 33.06 ml/m  RA Volume:   50.40 ml  23.04 ml/m LA Vol (A4C):   72.3 ml 33.06 ml/m LA Biplane Vol: 73.1 ml 33.42 ml/m  AORTIC VALVE LVOT Vmax:   85.80 cm/s LVOT Vmean:  61.100 cm/s LVOT VTI:    0.192 m  AORTA Ao Root diam: 3.70 cm Ao Asc diam:  3.40 cm MITRAL VALVE MV Area (PHT): 3.24 cm    SHUNTS MV Decel Time: 234 msec    Systemic VTI:  0.19 m MV E velocity: 76.70 cm/s  Systemic Diam: 2.25 cm Olga Millers MD Electronically signed by Olga Millers MD Signature Date/Time: 11/25/2022/2:43:27 PM    Final    VAS US CAROTID  Result Date: 11/25/2022 Carotid Arterial Duplex Study Patient Name:  Casey [name]  Date of Exam:  11/25/2022 Medical Rec #: [id number]       Accession #:    [id number] Date of Birth: 07-23-1945       Patient Gender: M Patient Age:   23 years Exam Location:  [facility] Procedure:      VAS US CAROTID Referring Phys: Bess Harvest [facility] --------------------------------------------------------------------------------  Indications:       CVA and Weakness. Risk Factors:      Hypertension, hyperlipidemia. Other Factors:     Atrial fibrillation since age of 71, on Xarelto. OSA. Limitations        Today's exam was limited due to the body habitus of the                    patient, the patient's respiratory variation and atrial                    fibrillation.  Comparison Study:  No prior study on file Performing Technologist: Sherren Kerns RVS  Examination Guidelines: A complete evaluation includes B-mode imaging, spectral Doppler, color Doppler, and power Doppler as needed of all accessible portions of each vessel. Bilateral testing is considered an integral part of a complete examination. Limited examinations for reoccurring indications may be performed as noted.  Right Carotid Findings: +----------+--------+--------+--------+------------------+------------------+           PSV cm/sEDV cm/sStenosisPlaque DescriptionComments           +----------+--------+--------+--------+------------------+------------------+ CCA Prox  86      16                                intimal thickening +----------+--------+--------+--------+------------------+------------------+ CCA Distal91      13                                intimal thickening +----------+--------+--------+--------+------------------+------------------+ ICA Prox  72      23      1-39%   heterogenous                         +----------+--------+--------+--------+------------------+------------------+ ICA Mid   89      21                                                   +----------+--------+--------+--------+------------------+------------------+ ICA Distal76      14                                                   +----------+--------+--------+--------+------------------+------------------+ ECA       76      10                                                   +----------+--------+--------+--------+------------------+------------------+ +----------+--------+-------+--------+-------------------+           PSV cm/sEDV cmsDescribeArm Pressure (mmHG) +----------+--------+-------+--------+-------------------+ [id number]                                        +----------+--------+-------+--------+-------------------+ +---------+--------+--+--------+-+  VertebralPSV cm/s35EDV cm/s6 +---------+--------+--+--------+-+  Left Carotid Findings: +----------+--------+--------+--------+------------------+------------------+           PSV cm/sEDV cm/sStenosisPlaque DescriptionComments           +----------+--------+--------+--------+------------------+------------------+ CCA Prox  103     24                                intimal thickening +----------+--------+--------+--------+------------------+------------------+ CCA Distal73      12                                intimal thickening +----------+--------+--------+--------+------------------+------------------+ ICA Prox  69      21      1-39%   calcific                             +----------+--------+--------+--------+------------------+------------------+ ICA Mid   71      22                                                   +----------+--------+--------+--------+------------------+------------------+ ICA Distal92      27                                                   +----------+--------+--------+--------+------------------+------------------+ ECA       75      11                                                   +----------+--------+--------+--------+------------------+------------------+ +----------+--------+--------+--------+-------------------+           PSV cm/sEDV cm/sDescribeArm Pressure (mmHG) +----------+--------+--------+--------+-------------------+ [id number]                                          +----------+--------+--------+--------+-------------------+ +---------+--------+--+--------+--+ VertebralPSV cm/s47EDV cm/s14 +---------+--------+--+--------+--+   Summary: Right Carotid: Velocities in the right ICA are consistent with a 1-39% stenosis. Left Carotid: Velocities in the left ICA are consistent with a 1-39% stenosis. Vertebrals:  Bilateral vertebral arteries demonstrate antegrade flow. Subclavians: Normal flow  hemodynamics were seen in bilateral subclavian              arteries. *See table(s) above for measurements and observations.     Preliminary    MR ANGIO HEAD WO CONTRAST  Result Date: 11/25/2022 CLINICAL DATA:  Stroke, follow up. EXAM: MRA HEAD WITHOUT CONTRAST TECHNIQUE: Angiographic images of the Circle of Willis were acquired using MRA technique without intravenous contrast. COMPARISON:  CTA head and neck 02/06/2017.  MRA head 12/27/2016. FINDINGS: Anterior circulation: The internal carotid arteries are widely patent from skull base to carotid termini. ACAs and MCAs are patent without evidence of a proximal branch occlusion or significant proximal stenosis. No aneurysm is identified. Posterior circulation: The included portions of the intracranial vertebral arteries are widely patent to the basilar. The basilar artery is widely patent. Posterior  communicating arteries are diminutive or absent. Both PCAs are patent without evidence of a significant proximal stenosis. No aneurysm is identified. Anatomic variants: None. IMPRESSION: Negative head MRA. Electronically Signed   By: Sebastian Ache M.D.   On: 11/25/2022 10:42   MR BRAIN WO CONTRAST  Result Date: 11/24/2022 CLINICAL DATA:  Transient ischemic attack EXAM: MRI HEAD WITHOUT CONTRAST TECHNIQUE: Multiplanar, multiecho pulse sequences of the brain and surrounding structures were obtained without intravenous contrast. COMPARISON:  12/27/2016 FINDINGS: Brain: Punctate focus of acute/early subacute ischemia in the right frontal white matter. No acute or chronic hemorrhage. There is multifocal hyperintense T2-weighted signal within the white matter. Generalized volume loss. The midline structures are normal. Vascular: Major flow voids are preserved. Skull and upper cervical spine: Normal calvarium and skull base. Visualized upper cervical spine and soft tissues are normal. Sinuses/Orbits:No paranasal sinus fluid levels or advanced mucosal thickening. No  mastoid or middle ear effusion. Normal orbits. IMPRESSION: 1. Punctate focus of acute/early subacute ischemia in the right frontal white matter. No hemorrhage or mass effect. 2. Chronic small vessel ischemia and volume loss. Electronically Signed   By: Deatra Robinson M.D.   On: 11/24/2022 23:03   CT HEAD CODE STROKE WO CONTRAST  Result Date: 11/24/2022 CLINICAL DATA:  Code stroke. Neuro deficit, acute, stroke suspected. Left-sided weakness. EXAM: CT HEAD WITHOUT CONTRAST TECHNIQUE: Contiguous axial images were obtained from the base of the skull through the vertex without intravenous contrast. RADIATION DOSE REDUCTION: This exam was performed according to the departmental dose-optimization program which includes automated exposure control, adjustment of the mA and/or kV according to patient size and/or use of iterative reconstruction technique. COMPARISON:  Head CT 11/02/2018 FINDINGS: Brain: Generalized parenchymal atrophy. Commensurate prominence of the ventricles and sulci. Patchy and ill-defined hypoattenuation within the cerebral white matter, nonspecific but compatible with mild chronic small vessel ischemic disease. There is no acute intracranial hemorrhage. No demarcated cortical infarct. No extra-axial fluid collection. No evidence of an intracranial mass. No midline shift. Vascular: No hyperdense vessel. Atherosclerotic calcifications. Skull: No fracture or aggressive osseous lesion. Sinuses/Orbits: No mass or acute finding within the imaged orbits. No significant paranasal sinus disease at the imaged levels. ASPECTS [facility] Stroke Program Early CT Score) - Ganglionic level infarction (caudate, lentiform nuclei, internal capsule, insula, M1-M3 cortex): 7 - Supraganglionic infarction (M4-M6 cortex): 3 Total score (0-10 with 10 being normal): 10 No evidence of an acute intracranial abnormality. These results were called by telephone at the time of interpretation on 11/24/2022 at 1:07 pm to provider Dr.  Jearld Fenton, who verbally acknowledged these results. IMPRESSION: 1.  No evidence of an acute intracranial abnormality. 2. Parenchymal atrophy and chronic small vessel ischemic disease. Electronically Signed   By: Jackey Loge D.O.   On: 11/24/2022 13:07     Assessment and Plan:   Acute CVA - in the setting of missing doses of xarelto and taking OAC at inconsistent times - thankfully, no residual deficits - defer antiplatelets to neurology   Permanent atrial fibrillation On 25 mg toprol Rate controlled   Chronic anticoagulation Has been maintained on 20 mg xarelto, no bleeding   CVA In the setting of missing doses of xarelto Neurology has suggested switching to eliquis   OSA Inconsistent needs for CPAP given weight fluctuations with depressive episodes   BP1 Continue olanzapine QTC acceptable   Concern for xarelto failure Discussed that he may have had a xarelto failure leading to his acute stroke. However, given his inconsistent dosing and missed doses,  I don't think this would be considered a xarelto failure and possibly more likely a result of missed doses. I'm not sure that twice daily dosing with eliquis would be a better fit for him at this time given his depression. Question if he would be a candidate for watchman device. We briefly discussed possible ways to increase compliance, his wife is very involved and helps with his medications.   Risk Assessment/Risk Scores:    CHA2DS2-VASc Score = 4   This indicates a 4.8% annual risk of stroke. The patient's score is based upon: CHF History: 0 HTN History: 0 Diabetes History: 0 Stroke History: 2 Vascular Disease History: 0 Age Score: 2 Gender Score: 0   For questions or updates, please contact [facility] Please consult [email or web address] for contact info under    Signed, Marcelino Duster, PA  11/25/2022 4:47 PM   I have seen and examined the patient along with Marcelino Duster, PA .  I have reviewed the  chart, notes and new data.  I agree with PA/NP's note.  Key new complaints: reviewed anticoagulant compliance with Simona Huh in detail. He has missed many doses of Xarelto. Rather than taking it with supper, he takes it at bedtime. But he often falls asleep in the chair watching TV and misses the medication. Key examination changes: very mild residual speech difficulty, no focal muscle weakness. Irregular rhythm. No evidence of heart failure/hypervolemia. Key new findings / data: rhythm strips show mild pauses (max 2") and periods of bradycardia that are consistently nocturnal, during expected sleep. He had RVR up to 160 bpm when he was in the [facility].  PLAN: Continue metoprolol. Needs to use CPAP at night for the bradycardia events. I do not think this was a failure of Xarelto as an anticoagulant strategy, but it was a failure of compliance with the medication. This is likely to happen again in the future due to Calix's longstanding issues with depression.  One better approach may be a Watchman device. Will discuss with Dr. Lalla Brothers and set him up for an office visit to review this.  I have seen Casey [name] is a 77 y.o. male in the hospital today who is being considered for a Watchman left atrial appendage closure device.  He has a history of permanent atrial fibrillation.  This patients CHA2DS2-VASc Score and unadjusted Ischemic Stroke Rate (% per year) is equal to 4.8 % stroke rate/year from a score of 4 which necessitates long term oral anticoagulation to prevent stroke.  Unfortunately, He is not felt to be a long term Warfarin candidate secondary to inability to comply with medication during periods of depression exacerbation.  The patients chart has been reviewed and I feel that they would be a candidate for short term oral anticoagulation, with careful supervison from his family.  Procedural risks for the Watchman implant have been reviewed with the patient including a 1% risk of stroke, 2% risk of  perforation, 0.1% risk of device embolization.  Given the patient's poor candidacy for long-term oral anticoagulation and ability to tolerate short term oral anticoagulation I have recommended the watchman left atrial appendage closure system.   Thurmon Fair, MD, [facility] [phone] 11/25/2022, 5:33 PM

## 2022-11-26 ENCOUNTER — Ambulatory Visit: Payer: Medicare Other | Admitting: Psychiatry

## 2022-11-26 ENCOUNTER — Other Ambulatory Visit (HOSPITAL_COMMUNITY): Payer: Self-pay

## 2022-11-26 DIAGNOSIS — I639 Cerebral infarction, unspecified: Secondary | ICD-10-CM | POA: Diagnosis not present

## 2022-11-26 DIAGNOSIS — I4821 Permanent atrial fibrillation: Secondary | ICD-10-CM | POA: Diagnosis not present

## 2022-11-26 LAB — BASIC METABOLIC PANEL
Anion gap: 12 (ref 5–15)
BUN: 14 mg/dL (ref 8–23)
CO2: 26 mmol/L (ref 22–32)
Calcium: 8.9 mg/dL (ref 8.9–10.3)
Chloride: 104 mmol/L (ref 98–111)
Creatinine, Ser: 1.24 mg/dL (ref 0.61–1.24)
GFR, Estimated: >60 mL/min (ref >60)
Glucose, Bld: 93 mg/dL (ref 70–99)
Potassium: 3.5 mmol/L (ref 3.5–5.1)
Sodium: 142 mmol/L (ref 135–145)

## 2022-11-26 LAB — MAGNESIUM: Magnesium: 1.8 mg/dL (ref 1.7–2.4)

## 2022-11-26 MED ORDER — ROSUVASTATIN CALCIUM 20 MG PO TABS
20.0000 mg | ORAL_TABLET | Freq: Every day | ORAL | 0 refills | Status: AC
  Filled 2022-11-26: qty 30, 30d supply, fill #0

## 2022-11-26 NOTE — Progress Notes (Signed)
Occupational Therapy Treatment Patient Details Name: Casey [name] MRN: [id number] DOB: 08/03/1945 Today's Date: 11/26/2022   History of present illness Casey [name] is a 77 year old male who presents to ED with acute left-sided weakness. CT head unremarkable. Pt transferred to [facility] for MRI. MRI findings: Punctate focus of acute/early subacute ischemia in the right frontal white matter. PMHx: permanent atrial fibrillation, OSA, bipolar disorder, mild tardive dyskinesia (from olanzepine), RLS, CKD, GERD, bipolar depression with h/o pseudodementia with depression, hypertension and hyperlipidemia.   OT comments  Casey [name] is progressing well towards his therapy goals. During session pt required mod A for LB ADLs, setup A for seated UB ADLs and supervision for functional mobility with and without use of RW which is his functional baseline. Pt remains limited secondary to impaired attention, recall, and awareness of deficits. Pt remains appropriate for current OT POC and recommendation of outpatient neuro upon discharge for further neuro cognitive assessment.    Recommendations for follow up therapy are one component of a multi-disciplinary discharge planning process, led by the attending physician.  Recommendations may be updated based on patient status, additional functional criteria and insurance authorization.    Assistance Recommended at Discharge Intermittent Supervision/Assistance  Patient can return home with the following  Direct supervision/assist for medications management;Direct supervision/assist for financial management;Assist for transportation   Equipment Recommendations  None recommended by OT (Pt owns equipment)    Recommendations for Other Services      Precautions / Restrictions Precautions Precautions: Fall Restrictions Weight Bearing Restrictions: No       Mobility Bed Mobility               General bed mobility comments: Pt in chair upon arrival     Transfers Overall transfer level: Needs assistance Equipment used: Rolling walker (2 wheels) Transfers: Sit to/from Stand Sit to Stand: Supervision           General transfer comment: Pt completed STS transfers from chair with and without use of RW with supervision for safety.     Balance Overall balance assessment: Mild deficits observed, not formally tested                                         ADL either performed or assessed with clinical judgement   ADL Overall ADL's : At baseline                 Upper Body Dressing : Set up;Sitting   Lower Body Dressing: Moderate assistance;Sit to/from stand Lower Body Dressing Details (indicate cue type and reason): Pt donned underwear and pants while alternating sitting/standing with mod A for threading BLEs as this is baseline assist that pt receives from wife. Pt donned slip on tennis shoes to bilat LEs while seated with setup A. Toilet Transfer: Supervision/safety;Rolling walker (2 wheels) Toilet Transfer Details (indicate cue type and reason): simulated from chair         Functional mobility during ADLs: Supervision/safety General ADL Comments: Pt at functional baseline per pt report with assistance given for LB ADLs and supervision for functional mobility. Pt required verbal cues for safety and sequencing during LB dressing task.    Extremity/Trunk Assessment Upper Extremity Assessment Upper Extremity Assessment: Overall WFL for tasks assessed   Lower Extremity Assessment Lower Extremity Assessment: Defer to PT evaluation        Vision   Vision Assessment?: No apparent visual  deficits   Perception Perception Perception: Not tested   Praxis Praxis Praxis: Not tested    Cognition Arousal/Alertness: Awake/alert Behavior During Therapy: WFL for tasks assessed/performed Overall Cognitive Status: Impaired/Different from baseline Area of Impairment: Problem solving, Safety/judgement, Memory,  Attention                   Current Attention Level: Sustained Memory: Decreased short-term memory   Safety/Judgement: Decreased awareness of deficits   Problem Solving: Slow processing, Requires verbal cues General Comments: Therapist administered Short Blessed Test (SBT) to assess cognition. Pt scored 8/28 on SBT indicating need for further cognitive assessement. Pt noted to have difficulty with recall during SBT. Pt required verbal cues throughout session for safety, sequencing and redirection during functional tasks. Pt noted to have decreased awareness of deficits.              General Comments VSS on RA, tele leads removed during session per RN request as pt discharging home. Pt's wife present towards end of session.    Pertinent Vitals/ Pain       Pain Assessment Pain Assessment: Faces Pain Score: 0-No pain Faces Pain Scale: No hurt Pain Intervention(s): Monitored during session  Home Living     Available Help at Discharge: Family;Available 24 hours/day Type of Home: House                              Lives With: Spouse    Prior Functioning/Environment              Frequency  Min 1X/week        Progress Toward Goals  OT Goals(current goals can now be found in the care plan section)  Progress towards OT goals: Progressing toward goals  Acute Rehab OT Goals Patient Stated Goal: to go home OT Goal Formulation: With patient Time For Goal Achievement: 12/09/22 Potential to Achieve Goals: Good ADL Goals Additional ADL Goal #1: Pt will complete executive functioning task independently in preparation for home IADLs. Additional ADL Goal #2: Pt will completed all ADLs independently.  Plan Discharge plan remains appropriate;Frequency remains appropriate       AM-PAC OT "6 Clicks" Daily Activity     Outcome Measure   Help from another person eating meals?: None Help from another person taking care of personal grooming?: A Little Help  from another person toileting, which includes using toliet, bedpan, or urinal?: A Little Help from another person bathing (including washing, rinsing, drying)?: A Little Help from another person to put on and taking off regular upper body clothing?: None Help from another person to put on and taking off regular lower body clothing?: A Little 6 Click Score: 20    End of Session Equipment Utilized During Treatment: Rolling walker (2 wheels)  OT Visit Diagnosis: Other symptoms and signs involving cognitive function;Cognitive communication deficit (R41.841) Symptoms and signs involving cognitive functions: Cerebral infarction   Activity Tolerance Patient tolerated treatment well   Patient Left in chair;with call bell/phone within reach;with family/visitor present;with nursing/sitter in room   Nurse Communication Mobility status        Time: 4098-1191 OT Time Calculation (min): 19 min  Charges: OT General Charges $OT Visit: 1 Visit OT Treatments $Self Care/Home Management : 8-22 mins  Sherley Bounds, OTS Acute Rehabilitation Services Office [phone] Secure Chat Communication Preferred   Sherley Bounds 11/26/2022, 2:51 PM

## 2022-11-26 NOTE — Evaluation (Signed)
Speech Language Pathology Evaluation Patient Details Name: Casey [name] MRN: [id number] DOB: Jun 29, 1946 Today's Date: 11/26/2022 Time: 4098-1191 SLP Time Calculation (min) (ACUTE ONLY): 25 min  Problem List:  Patient Active Problem List   Diagnosis Date Noted   Acute CVA (cerebrovascular accident) (HCC) 11/24/2022   Pain in left shoulder 02/21/2021   Constipation 12/28/2018   N&V (nausea and vomiting) 12/28/2018   GERD (gastroesophageal reflux disease) 12/28/2018   Abdominal pain 12/28/2018   Weight loss, unintentional 12/28/2018   Atrial fibrillation with RVR (HCC)    Sepsis (HCC) 06/13/2018   Acute metabolic encephalopathy 06/13/2018   Thrombocytopenia (HCC) 06/13/2018   Renal insufficiency 06/13/2018   Anxiety 06/12/2018   Bipolar disorder (HCC) 06/12/2018   Chronic anticoagulation 05/05/2017   Sleep apnea 05/05/2017   Edema leg 05/05/2017   Dysphasia 02/07/2017   Mild obesity 05/10/2016   History of adenomatous polyp of colon    Diverticulosis of colon without hemorrhage    Hyperlipidemia LDL goal <130 05/16/2013   KNEE PAIN 08/07/2009   KNEE SPRAIN, ACUTE 08/07/2009   COLONIC POLYPS, HX OF 05/10/2009   Hypothyroidism 05/08/2009   History of bipolar disorder 05/08/2009   Essential hypertension 05/08/2009   Permanent atrial fibrillation 05/08/2009   HEMATOCHEZIA 05/08/2009   LOW BACK PAIN, CHRONIC 05/08/2009   ABDOMINAL PAIN 05/08/2009   Past Medical History:  Past Medical History:  Diagnosis Date   Arthritis    Asthma    Atrial fibrillation (HCC) 01/12/2010   2D Echo EF=>55%   Bipolar disorder (HCC)    Chronic back pain    Depression    Dysrhythmia    AFib   GERD (gastroesophageal reflux disease)    History of colonic polyps    History of gout    Hyperlipidemia    Hypertension    Hypothyroidism    Morbid obesity (HCC)    OSA (obstructive sleep apnea)    AHI was 27.62hr RDI was 34.3 hr REM 0.00hr; had CPAP years ago but no longer uses.   Prostatitis     Tubular adenoma of colon 01/2016   Past Surgical History:  Past Surgical History:  Procedure Laterality Date   APPENDECTOMY  1959   asthma     CATARACT EXTRACTION Bilateral 1995   COLONOSCOPY  2011   RMR: left-sided diverticula, minimal rectal friability. No polyps. Repeat 5 years.   COLONOSCOPY WITH PROPOFOL N/A 01/15/2016   Dr.Rourk- diverticulosis in the entire examined colon, multiple rectal and colonic polyps, internal hemorrhoids. bx= tubular adenomas and hyperplastic polyps.    COLONOSCOPY WITH PROPOFOL N/A 03/13/2022   Procedure: COLONOSCOPY WITH PROPOFOL;  Surgeon: Corbin Ade, MD;  Location: [facility];  Service: Endoscopy;  Laterality: N/A;  11:00am   HAND / FINGER LESION EXCISION Right    IR THORACENTESIS ASP PLEURAL SPACE W/IMG GUIDE  03/01/2019   IR THORACENTESIS ASP PLEURAL SPACE W/IMG GUIDE  03/31/2019   POLYPECTOMY  01/15/2016   Procedure: POLYPECTOMY;  Surgeon: Corbin Ade, MD;  Location: [facility];  Service: Endoscopy;;   POLYPECTOMY  03/13/2022   Procedure: POLYPECTOMY;  Surgeon: Corbin Ade, MD;  Location: [facility];  Service: Endoscopy;;   right shoulder surgery     TONSILLECTOMY  1955   HPI:  Per MD notes "77 year old gentleman with history of permanent atrial fibrillation on rivaroxaban, OSA, bipolar disorder medically managed, mild tardive dyskinesia (from olanzepine), RLS, CKD, GERD, bipolar depression with h/o pseudodementia with depression, hypertension, hyperlipidemia who was in church today with  his wife and while ambulating he developed acute left-sided weakness. He was transferred to [facility] and diagnosed with acute stroke - right frontal.  Pt with left facial asymmetry - Speech/language evaluation.   Assessment / Plan / Recommendation Clinical Impression  SLUMS *[employer] Mental Status Exam completed with her scoring 22/30 - indicative of mild cognitive impairment *scoring 27-30 normal, 21-26 mild cognitive impairment, 1-20  dementia).  He demonstrates impaired sustained attention as well as delay in verbal expression.  He has support at home of his wife *per pt* as she manages his bills, medication, appointments, cooking, and cleaning.    Advised him to assure he reduces background distraction to be able to "focus" and to practice reading to help with fluency.  No family present.    SLP Assessment  SLP Recommendation/Assessment: Patient does not need any further Speech Lanaguage Pathology Services SLP Visit Diagnosis: Cognitive communication deficit (R41.841)    Recommendations for follow up therapy are one component of a multi-disciplinary discharge planning process, led by the attending physician.  Recommendations may be updated based on patient status, additional functional criteria and insurance authorization.    Follow Up Recommendations  No SLP follow up    Assistance Recommended at Discharge  Other (comment) (wife conducts home duties)  Functional Status Assessment Patient has had a recent decline in their functional status and demonstrates the ability to make significant improvements in function in a reasonable and predictable amount of time.  Frequency and Duration           SLP Evaluation Cognition  Overall Cognitive Status: Impaired/Different from baseline Orientation Level: Oriented X4 Year: 2024 Month: May Day of Week: Incorrect Attention: Sustained Sustained Attention: Appears intact Memory: Impaired Memory Impairment:  (pt able to recall 5/5 words after initially requiring several repetitions) Awareness: Impaired Awareness Impairment: Intellectual impairment (pt with impaired awareness to expressive language deficits) Problem Solving: Appears intact Safety/Judgment: Appears intact       Comprehension  Auditory Comprehension Overall Auditory Comprehension: Appears within functional limits for tasks assessed Yes/No Questions: Not tested Commands: Within Functional  Limits Conversation: Complex Interfering Components: Processing speed;Attention EffectiveTechniques: Repetition Visual Recognition/Discrimination Discrimination: Not tested Reading Comprehension Reading Status: Within funtional limits    Expression Expression Primary Mode of Expression: Verbal Verbal Expression Overall Verbal Expression: Appears within functional limits for tasks assessed Initiation: No impairment Level of Generative/Spontaneous Verbalization: Sentence Repetition: No impairment Naming: Not tested Pragmatics: Impairment Interfering Components: Attention Non-Verbal Means of Communication: Not applicable Written Expression Dominant Hand: Right   Oral / Motor  Oral Motor/Sensory Function Overall Oral Motor/Sensory Function: Mild impairment Facial ROM: Reduced left;Within Functional Limits Facial Symmetry: Abnormal symmetry left Facial Strength: Within Functional Limits Facial Sensation: Within Functional Limits Lingual ROM: Within Functional Limits Lingual Symmetry: Within Functional Limits Lingual Strength: Within Functional Limits Lingual Sensation: Within Functional Limits Velum: Within Functional Limits Mandible: Within Functional Limits Motor Speech Overall Motor Speech: Appears within functional limits for tasks assessed Respiration: Within functional limits Phonation: Normal Resonance: Within functional limits Articulation: Within functional limitis Intelligibility: Intelligible Motor Planning: Not tested Motor Speech Errors: Not applicable Effective Techniques: Slow rate            Chales Abrahams 11/26/2022, 2:56 PM  Rolena Infante, MS [facility] SLP Acute Rehab Services Office [phone]

## 2022-11-26 NOTE — Progress Notes (Signed)
SLE and cog evaluation completed, full report to follow.  SLUMS *[employer] Mental Status Exam completed with her scoring 22/30 - indicative of mild cognitive impairment *scoring 27-30 normal, 21-26 mild cognitive impairment, 1-20 dementia).  He demonstrates impaired sustained attention as well as delay in verbal expression.  He has support at home of his wife *per pt* as she manages his bills, medication, appointments, cooking, and cleaning.    Advised him to assure he reduces background distraction to be able to "focus" and to practice reading to help with fluency.  No family present.    Rolena Infante, MS [facility] SLP Acute [employer] [phone]

## 2022-11-26 NOTE — Progress Notes (Signed)
Rounding Note    Patient Name: Casey [name] Date of Encounter: 11/26/2022  [facility] Cardiologist: Thurmon Fair, MD   Subjective   Feels well. No residual stroke after effects. Rat econtrolled AFib in 50s during sleep (occasional 2" pauses) , 70s when awake.  Inpatient Medications    Scheduled Meds:   stroke: early stages of recovery book   Does not apply Once   levothyroxine  25 mcg Oral Q0600   OLANZapine  5 mg Oral QHS   pantoprazole  40 mg Oral Daily   pramipexole  0.375 mg Oral BID   rivaroxaban  20 mg Oral Q supper   rosuvastatin  20 mg Oral Daily   Continuous Infusions:  PRN Meds: acetaminophen **OR** acetaminophen (TYLENOL) oral liquid 160 mg/5 mL **OR** acetaminophen, senna-docusate   Vital Signs    Vitals:   11/26/22 0826 11/26/22 0827 11/26/22 0828 11/26/22 0901  BP:    (!) 135/122  Pulse: (!) 57 67 67 66  Resp: 14 14 15 14   Temp:    98.3 F (36.8 C)  TempSrc:    Oral  SpO2: 98% 97% 97% 95%  Weight:      Height:        Intake/Output Summary (Last 24 hours) at 11/26/2022 1031 Last data filed at 11/26/2022 1000 Gross per 24 hour  Intake 100 ml  Output 250 ml  Net -150 ml      11/24/2022   12:47 PM 08/21/2022    8:41 AM 03/13/2022   10:06 AM  Last 3 Weights  Weight (lbs) 212 lb 11.9 oz 212 lb 11.2 oz 223 lb 1.7 oz  Weight (kg) 96.5 kg 96.48 kg 101.2 kg      Telemetry    AF rate controlled, occasional nocturnal pauses max 2.2". - Personally Reviewed  ECG    No new tracing - Personally Reviewed  Physical Exam  Appears well GEN: No acute distress.   Neck: No JVD Cardiac: irregular, no murmurs, rubs, or gallops.  Respiratory: Clear to auscultation bilaterally. GI: Soft, nontender, non-distended  MS: No edema; No deformity. Neuro:  Nonfocal  Psych: Normal affect   Labs    High Sensitivity Troponin:  No results for input(s): "TROPONINIHS" in the last 720 hours.   Chemistry Recent Labs  Lab 11/24/22 1252  11/26/22 0429  NA 138 142  K 3.9 3.5  CL 104 104  CO2 26 26  GLUCOSE 103* 93  BUN 21 14  CREATININE 1.38* 1.24  CALCIUM 8.8* 8.9  MG  --  1.8  PROT 7.0  --   ALBUMIN 3.9  --   AST 18  --   ALT 13  --   ALKPHOS 70  --   BILITOT 0.8  --   GFRNONAA 53* >60  ANIONGAP 8 12    Lipids  Recent Labs  Lab 11/25/22 0747  CHOL 163  TRIG 73  HDL 48  LDLCALC 100*  CHOLHDL 3.4    Hematology Recent Labs  Lab 11/24/22 1252 11/24/22 1433  WBC 7.2 7.3  RBC 4.46 4.42  HGB 14.2 14.2  HCT 42.2 42.1  MCV 94.6 95.2  MCH 31.8 32.1  MCHC 33.6 33.7  RDW 13.2 13.2  PLT 162 153   Thyroid  Recent Labs  Lab 11/25/22 0754  TSH 2.288    BNPNo results for input(s): "BNP", "PROBNP" in the last 168 hours.  DDimer No results for input(s): "DDIMER" in the last 168 hours.   Radiology  ECHOCARDIOGRAM COMPLETE  Result Date: 11/25/2022    ECHOCARDIOGRAM REPORT   Patient Name:   Casey [name] Date of Exam: 11/25/2022 Medical Rec #:  [id number]      Height:       72.0 in Accession #:    [id number]     Weight:       212.7 lb Date of Birth:  May 27, 1946      BSA:          2.187 m Patient Age:    77 years       BP:           154/103 mmHg Patient Gender: M              HR:           84 bpm. Exam Location:  Inpatient Procedure: 2D Echo, Cardiac Doppler and Color Doppler Indications:    stroke  History:        Patient has prior history of Echocardiogram examinations.                 Arrythmias:Atrial Fibrillation; Risk Factors:Hypertension,                 Dyslipidemia and Sleep Apnea.  Sonographer:    Mike Gip Referring Phys: [phone] CLANFORD L JOHNSON IMPRESSIONS  1. Left ventricular ejection fraction, by estimation, is 60 to 65%. The left ventricle has normal function. The left ventricle has no regional wall motion abnormalities. Left ventricular diastolic function could not be evaluated.  2. Right ventricular systolic function is normal. The right ventricular size is normal.  3. The mitral valve is  normal in structure. Trivial mitral valve regurgitation. No evidence of mitral stenosis.  4. The aortic valve is tricuspid. Aortic valve regurgitation is not visualized. Aortic valve sclerosis is present, with no evidence of aortic valve stenosis.  5. The inferior vena cava is normal in size with greater than 50% respiratory variability, suggesting right atrial pressure of 3 mmHg. Comparison(s): No prior Echocardiogram. FINDINGS  Left Ventricle: Left ventricular ejection fraction, by estimation, is 60 to 65%. The left ventricle has normal function. The left ventricle has no regional wall motion abnormalities. The left ventricular internal cavity size was normal in size. There is  no left ventricular hypertrophy. Left ventricular diastolic function could not be evaluated due to atrial fibrillation. Left ventricular diastolic function could not be evaluated. Right Ventricle: The right ventricular size is normal. Right ventricular systolic function is normal. Left Atrium: Left atrial size was normal in size. Right Atrium: Right atrial size was normal in size. Pericardium: There is no evidence of pericardial effusion. Mitral Valve: The mitral valve is normal in structure. Trivial mitral valve regurgitation. No evidence of mitral valve stenosis. Tricuspid Valve: The tricuspid valve is normal in structure. Tricuspid valve regurgitation is trivial. No evidence of tricuspid stenosis. Aortic Valve: The aortic valve is tricuspid. Aortic valve regurgitation is not visualized. Aortic valve sclerosis is present, with no evidence of aortic valve stenosis. Pulmonic Valve: The pulmonic valve was normal in structure. Pulmonic valve regurgitation is not visualized. No evidence of pulmonic stenosis. Aorta: The aortic root is normal in size and structure. Venous: The inferior vena cava is normal in size with greater than 50% respiratory variability, suggesting right atrial pressure of 3 mmHg. IAS/Shunts: No atrial level shunt detected  by color flow Doppler.  LEFT VENTRICLE PLAX 2D LVIDd:         5.00 cm      Diastology  LVIDs:         2.70 cm      LV e' medial:    8.49 cm/s LV PW:         1.20 cm      LV E/e' medial:  9.0 LV IVS:        1.10 cm      LV e' lateral:   11.70 cm/s LVOT diam:     2.25 cm      LV E/e' lateral: 6.6 LV SV:         76 LV SV Index:   35 LVOT Area:     3.98 cm  LV Volumes (MOD) LV vol d, MOD A2C: 105.0 ml LV vol d, MOD A4C: 77.6 ml LV vol s, MOD A2C: 40.7 ml LV vol s, MOD A4C: 32.9 ml LV SV MOD A2C:     64.3 ml LV SV MOD A4C:     77.6 ml LV SV MOD BP:      55.6 ml RIGHT VENTRICLE             IVC RV Basal diam:  4.10 cm     IVC diam: 1.20 cm RV S prime:     12.10 cm/s TAPSE (M-mode): 1.8 cm LEFT ATRIUM             Index        RIGHT ATRIUM           Index LA diam:        4.30 cm 1.97 cm/m   RA Area:     20.00 cm LA Vol (A2C):   72.3 ml 33.06 ml/m  RA Volume:   50.40 ml  23.04 ml/m LA Vol (A4C):   72.3 ml 33.06 ml/m LA Biplane Vol: 73.1 ml 33.42 ml/m  AORTIC VALVE LVOT Vmax:   85.80 cm/s LVOT Vmean:  61.100 cm/s LVOT VTI:    0.192 m  AORTA Ao Root diam: 3.70 cm Ao Asc diam:  3.40 cm MITRAL VALVE MV Area (PHT): 3.24 cm    SHUNTS MV Decel Time: 234 msec    Systemic VTI:  0.19 m MV E velocity: 76.70 cm/s  Systemic Diam: 2.25 cm Olga Millers MD Electronically signed by Olga Millers MD Signature Date/Time: 11/25/2022/2:43:27 PM    Final    VAS US CAROTID  Result Date: 11/25/2022 Carotid Arterial Duplex Study Patient Name:  SHIA FRICKEY  Date of Exam:   11/25/2022 Medical Rec #: [id number]       Accession #:    [id number] Date of Birth: 1945-09-03       Patient Gender: M Patient Age:   72 years Exam Location:  [facility] Procedure:      VAS US CAROTID Referring Phys: Bess Harvest [facility] --------------------------------------------------------------------------------  Indications:       CVA and Weakness. Risk Factors:      Hypertension, hyperlipidemia. Other Factors:     Atrial fibrillation since age of 3, on  Xarelto. OSA. Limitations        Today's exam was limited due to the body habitus of the                    patient, the patient's respiratory variation and atrial                    fibrillation. Comparison Study:  No prior study on file Performing Technologist: Sherren Kerns RVS  Examination Guidelines: A complete evaluation includes B-mode imaging, spectral  Doppler, color Doppler, and power Doppler as needed of all accessible portions of each vessel. Bilateral testing is considered an integral part of a complete examination. Limited examinations for reoccurring indications may be performed as noted.  Right Carotid Findings: +----------+--------+--------+--------+------------------+------------------+           PSV cm/sEDV cm/sStenosisPlaque DescriptionComments           +----------+--------+--------+--------+------------------+------------------+ CCA Prox  86      16                                intimal thickening +----------+--------+--------+--------+------------------+------------------+ CCA Distal91      13                                intimal thickening +----------+--------+--------+--------+------------------+------------------+ ICA Prox  72      23      1-39%   heterogenous                         +----------+--------+--------+--------+------------------+------------------+ ICA Mid   89      21                                                   +----------+--------+--------+--------+------------------+------------------+ ICA Distal76      14                                                   +----------+--------+--------+--------+------------------+------------------+ ECA       76      10                                                   +----------+--------+--------+--------+------------------+------------------+ +----------+--------+-------+--------+-------------------+           PSV cm/sEDV cmsDescribeArm Pressure (mmHG)  +----------+--------+-------+--------+-------------------+ [id number]                                        +----------+--------+-------+--------+-------------------+ +---------+--------+--+--------+-+ VertebralPSV cm/s35EDV cm/s6 +---------+--------+--+--------+-+  Left Carotid Findings: +----------+--------+--------+--------+------------------+------------------+           PSV cm/sEDV cm/sStenosisPlaque DescriptionComments           +----------+--------+--------+--------+------------------+------------------+ CCA Prox  103     24                                intimal thickening +----------+--------+--------+--------+------------------+------------------+ CCA Distal73      12                                intimal thickening +----------+--------+--------+--------+------------------+------------------+ ICA Prox  69      21      1-39%   calcific                             +----------+--------+--------+--------+------------------+------------------+  ICA Mid   71      22                                                   +----------+--------+--------+--------+------------------+------------------+ ICA Distal92      27                                                   +----------+--------+--------+--------+------------------+------------------+ ECA       75      11                                                   +----------+--------+--------+--------+------------------+------------------+ +----------+--------+--------+--------+-------------------+           PSV cm/sEDV cm/sDescribeArm Pressure (mmHG) +----------+--------+--------+--------+-------------------+ [id number]                                          +----------+--------+--------+--------+-------------------+ +---------+--------+--+--------+--+ VertebralPSV cm/s47EDV cm/s14 +---------+--------+--+--------+--+   Summary: Right Carotid: Velocities in the right ICA are  consistent with a 1-39% stenosis. Left Carotid: Velocities in the left ICA are consistent with a 1-39% stenosis. Vertebrals:  Bilateral vertebral arteries demonstrate antegrade flow. Subclavians: Normal flow hemodynamics were seen in bilateral subclavian              arteries. *See table(s) above for measurements and observations.     Preliminary    MR ANGIO HEAD WO CONTRAST  Result Date: 11/25/2022 CLINICAL DATA:  Stroke, follow up. EXAM: MRA HEAD WITHOUT CONTRAST TECHNIQUE: Angiographic images of the Circle of Willis were acquired using MRA technique without intravenous contrast. COMPARISON:  CTA head and neck 02/06/2017.  MRA head 12/27/2016. FINDINGS: Anterior circulation: The internal carotid arteries are widely patent from skull base to carotid termini. ACAs and MCAs are patent without evidence of a proximal branch occlusion or significant proximal stenosis. No aneurysm is identified. Posterior circulation: The included portions of the intracranial vertebral arteries are widely patent to the basilar. The basilar artery is widely patent. Posterior communicating arteries are diminutive or absent. Both PCAs are patent without evidence of a significant proximal stenosis. No aneurysm is identified. Anatomic variants: None. IMPRESSION: Negative head MRA. Electronically Signed   By: Sebastian Ache M.D.   On: 11/25/2022 10:42   MR BRAIN WO CONTRAST  Result Date: 11/24/2022 CLINICAL DATA:  Transient ischemic attack EXAM: MRI HEAD WITHOUT CONTRAST TECHNIQUE: Multiplanar, multiecho pulse sequences of the brain and surrounding structures were obtained without intravenous contrast. COMPARISON:  12/27/2016 FINDINGS: Brain: Punctate focus of acute/early subacute ischemia in the right frontal white matter. No acute or chronic hemorrhage. There is multifocal hyperintense T2-weighted signal within the white matter. Generalized volume loss. The midline structures are normal. Vascular: Major flow voids are preserved. Skull  and upper cervical spine: Normal calvarium and skull base. Visualized upper cervical spine and soft tissues are normal. Sinuses/Orbits:No paranasal sinus fluid levels or advanced mucosal thickening. No mastoid or middle ear effusion. Normal orbits. IMPRESSION: 1. Punctate focus of  acute/early subacute ischemia in the right frontal white matter. No hemorrhage or mass effect. 2. Chronic small vessel ischemia and volume loss. Electronically Signed   By: Deatra Robinson M.D.   On: 11/24/2022 23:03   CT HEAD CODE STROKE WO CONTRAST  Result Date: 11/24/2022 CLINICAL DATA:  Code stroke. Neuro deficit, acute, stroke suspected. Left-sided weakness. EXAM: CT HEAD WITHOUT CONTRAST TECHNIQUE: Contiguous axial images were obtained from the base of the skull through the vertex without intravenous contrast. RADIATION DOSE REDUCTION: This exam was performed according to the departmental dose-optimization program which includes automated exposure control, adjustment of the mA and/or kV according to patient size and/or use of iterative reconstruction technique. COMPARISON:  Head CT 11/02/2018 FINDINGS: Brain: Generalized parenchymal atrophy. Commensurate prominence of the ventricles and sulci. Patchy and ill-defined hypoattenuation within the cerebral white matter, nonspecific but compatible with mild chronic small vessel ischemic disease. There is no acute intracranial hemorrhage. No demarcated cortical infarct. No extra-axial fluid collection. No evidence of an intracranial mass. No midline shift. Vascular: No hyperdense vessel. Atherosclerotic calcifications. Skull: No fracture or aggressive osseous lesion. Sinuses/Orbits: No mass or acute finding within the imaged orbits. No significant paranasal sinus disease at the imaged levels. ASPECTS [facility] Stroke Program Early CT Score) - Ganglionic level infarction (caudate, lentiform nuclei, internal capsule, insula, M1-M3 cortex): 7 - Supraganglionic infarction (M4-M6 cortex): 3  Total score (0-10 with 10 being normal): 10 No evidence of an acute intracranial abnormality. These results were called by telephone at the time of interpretation on 11/24/2022 at 1:07 pm to provider Dr. Jearld Fenton, who verbally acknowledged these results. IMPRESSION: 1.  No evidence of an acute intracranial abnormality. 2. Parenchymal atrophy and chronic small vessel ischemic disease. Electronically Signed   By: Jackey Loge D.O.   On: 11/24/2022 13:07    Cardiac Studies   Echo above - normal findings  Patient Profile     77 y.o. male with permanent atrial fibrillation, bipolar depression, OSA presenting with small embolic CVA due to poor compliance with anticoagulation  Assessment & Plan    Instructed to take Xarelto daily with supper. Will refer for consultation for a watchman device with Dr. Lalla Brothers. OK for DC home [facility] will sign off.   Medication Recommendations:  xarelto 20 mg daily w supper, metoprolol succinate 12.5 mg daily Other recommendations (labs, testing, etc):  n/a Follow up as an outpatient:  will arrange EP consultation w Dr. Lalla Brothers for Watchman implantation.  I have seen DONOVON MIDDLEMISS is a 77 y.o. male in the hospital today who is being considered for a Watchman left atrial appendage closure device.  He has a history of permanent atrial fibrillation.  This patients CHA2DS2-VASc Score and unadjusted Ischemic Stroke Rate (% per year) is equal to 4.8 % stroke rate/year from a score of 4 which necessitates long term oral anticoagulation to prevent stroke.  Unfortunately, He is not felt to be a long term Warfarin candidate secondary to inability to comply with medication during periods of depression exacerbation.  The patients chart has been reviewed and I feel that they would be a candidate for short term oral anticoagulation, with careful supervison from his family.  Procedural risks for the Watchman implant have been reviewed with the patient including a 1% risk of  stroke, 2% risk of perforation, 0.1% risk of device embolization.  Given the patient's poor candidacy for long-term oral anticoagulation and ability to tolerate short term oral anticoagulation I have recommended the watchman left atrial appendage  closure system.   For questions or updates, please contact [facility] Please consult [email or web address] for contact info under        Signed, Thurmon Fair, MD  11/26/2022, 10:31 AM

## 2022-11-26 NOTE — Discharge Summary (Signed)
Casey [name] ZOX:[id number] DOB: 20-Feb-1946 DOA: 11/24/2022  PCP: Assunta Found, MD  Admit date: 11/24/2022  Discharge date: 11/26/2022  Admitted From: Home   Disposition:  Home   Recommendations for Outpatient Follow-up:   Follow up with PCP in 1-2 weeks  PCP Please obtain BMP/CBC, 2 view CXR in 1week,  (see Discharge instructions)   PCP Please follow up on the following pending results:    Home Health: PT, OT, SLP if qualifies   Equipment/Devices: As below  Consultations: Neuro, Cards Discharge Condition: Stable    CODE STATUS: Full    Diet Recommendation: Dysphagia 3 diet   Chief Complaint  Patient presents with   Code Stroke     Brief history of present illness from the day of admission and additional interim summary    77 year old gentleman with history of permanent atrial fibrillation on rivaroxaban, OSA, bipolar disorder medically managed, mild tardive dyskinesia (from olanzepine), RLS, CKD, GERD, bipolar depression with h/o pseudodementia with depression, hypertension, hyperlipidemia who was in church today with his wife and while ambulating he developed acute left-sided weakness around noon, he presented to [facility], ER from where he was transferred to [facility] and diagnosed with acute stroke.                                                                  Hospital Course   Left-sided weakness, left-sided facial droop in a patient with acute right frontal ischemic infarcts.  Could be due to underlying A-fib although he is already on Xarelto and compliant with it, secondary risk factors for stroke will be checked, stroke workup per neuro team.  A1c is stable, LDL was above goal hence based on statin, echo and carotid ultrasound nonacute, patient was found to be noncompliant with Xarelto as the  wife told the cardiologist, this is not Xarelto failure.  He was counseled on Xarelto compliance as well.  At this time he feels better and close to baseline eager to go home, will be discharged home on Xarelto, statin, home PT, OT and speech therapy if he qualifies for.  With close outpatient follow-up with PCP, neurology and his primary cardiologist postdischarge.    History of paroxysmal atrial fibrillation [place] vas 2 score of greater than 5.  Continue Xarelto apparently per wife he was not compliant with it, Toprol-XL held as he had events of severe bradycardia at night with heart rate dropping into mid 30s, was seen by his cardiologist Dr. Royann Shivers who recommended to continue present dose beta-blocker and be compliant with nighttime CPAP.  He will follow-up with his cardiologist within 7 to 10 days of discharge.   AKI.  Hold after hydration with IV fluids.   Hypertension.  Permissive hypertension due to CVA.   OSA.  Nighttime CPAP.  Has  been counseled on compliance   Dyslipidemia.  Based on Crestor for better LDL control   GERD.  On PPI.      Discharge diagnosis     Principal Problem:   Acute CVA (cerebrovascular accident) [facility]) Active Problems:   Hypothyroidism   History of bipolar disorder   Essential hypertension   Permanent atrial fibrillation   Hyperlipidemia LDL goal <130   Diverticulosis of colon without hemorrhage   Sleep apnea   Anxiety   GERD (gastroesophageal reflux disease)    Discharge instructions    Discharge Instructions     Ambulatory referral to Neurology   Complete by: As directed    Follow up with stroke clinic NP (Jessica Vanschaick or Darrol Angel, if both not available, consider Manson Allan, or Ahern) at [facility] in about 4 weeks. Thanks.   Discharge instructions   Complete by: As directed    Currently remain compliant with your nighttime CPAP and Xarelto, follow-up with recommended neurologist and cardiologist within 1 to 2 weeks of  discharge.  Follow with Primary MD Assunta Found, MD in 7 days   Get CBC, CMP, 2 view Chest X ray -  checked next visit with your primary MD   Activity: As tolerated with Full fall precautions use walker/cane & assistance as needed  Disposition Home   Diet: Dysphagia 3 diet with feeding assistance and aspiration precautions.  Special Instructions: If you have smoked or chewed Tobacco  in the last 2 yrs please stop smoking, stop any regular Alcohol  and or any Recreational drug use.  On your next visit with your primary care physician please Get Medicines reviewed and adjusted.  Please request your Prim.MD to go over all Hospital Tests and Procedure/Radiological results at the follow up, please get all Hospital records sent to your Prim MD by signing hospital release before you go home.  If you experience worsening of your admission symptoms, develop shortness of breath, life threatening emergency, suicidal or homicidal thoughts you must seek medical attention immediately by calling 911 or calling your MD immediately  if symptoms less severe.  You Must read complete instructions/literature along with all the possible adverse reactions/side effects for all the Medicines you take and that have been prescribed to you. Take any new Medicines after you have completely understood and accpet all the possible adverse reactions/side effects.   Increase activity slowly   Complete by: As directed        Discharge Medications   Allergies as of 11/26/2022       Reactions   Amlodipine Other (See Comments)   Stopped due to kidney issues   Vicodin [hydrocodone-acetaminophen] Other (See Comments)   Patient is unsure of reaction        Medication List     STOP taking these medications    furosemide 20 MG tablet Commonly known as: LASIX       TAKE these medications    acetaminophen 500 MG tablet Commonly known as: TYLENOL Take 1,000 mg by mouth every 6 (six) hours as needed for  headache.   cholecalciferol 1000 units tablet Commonly known as: VITAMIN D Take 2,000 Units by mouth daily.   levothyroxine 25 MCG tablet Commonly known as: SYNTHROID Take 25 mcg by mouth daily before breakfast.   metoprolol succinate 25 MG 24 hr tablet Commonly known as: TOPROL-XL Take 1 tablet (25 mg total) by mouth daily.   OLANZapine 5 MG tablet Commonly known as: ZYPREXA TAKE 1 TABLET BY MOUTH AT BEDTIME What changed:  when to take this   pantoprazole 40 MG tablet Commonly known as: PROTONIX TAKE ONE TABLET BY MOUTH ONCE DAILY   pramipexole 0.25 MG tablet Commonly known as: MIRAPEX 1 twice daily for 4 days then 1 and 1/2 tablets twice daily   rivaroxaban 20 MG Tabs tablet Commonly known as: XARELTO Take 20 mg by mouth daily after supper.   rosuvastatin 20 MG tablet Commonly known as: CRESTOR Take 1 tablet (20 mg total) by mouth daily.   SUPER B COMPLEX/C PO Take 1 tablet by mouth daily.         Follow-up Information     [facility]. Schedule an appointment as soon as possible for a visit in 1 month(s).   Specialty: Neurology Why: stroke clinic Contact information: [address] [place] [address] [phone]        [facility]. Schedule an appointment as soon as possible for a visit.   Why: PLease call and make your appointment for outpatient therapy Contact information: [address]  [place], [place]  [address]  [phone]        Croitoru, Rachelle Hora, MD. Schedule an appointment as soon as possible for a visit in 1 week(s).   Specialty: Cardiology Contact information: [address] [place] [address] [phone]         Assunta Found, MD. Schedule an appointment as soon as possible for a visit in 1 week(s).   Specialty: Family Medicine Contact information: [address] [place] [address] [phone]                  Major procedures and Radiology Reports - PLEASE review detailed and final reports thoroughly  -      ECHOCARDIOGRAM COMPLETE  Result Date: 11/25/2022    ECHOCARDIOGRAM REPORT   Patient Name:   DARVIS CARTON Date of Exam: 11/25/2022 Medical Rec #:  [id number]      Height:       72.0 in Accession #:    [id number]     Weight:       212.7 lb Date of Birth:  03/31/1946      BSA:          2.187 m Patient Age:    77 years       BP:           154/103 mmHg Patient Gender: M              HR:           84 bpm. Exam Location:  Inpatient Procedure: 2D Echo, Cardiac Doppler and Color Doppler Indications:    stroke  History:        Patient has prior history of Echocardiogram examinations.                 Arrythmias:Atrial Fibrillation; Risk Factors:Hypertension,                 Dyslipidemia and Sleep Apnea.  Sonographer:    Mike Gip Referring Phys: [phone] CLANFORD L JOHNSON IMPRESSIONS  1. Left ventricular ejection fraction, by estimation, is 60 to 65%. The left ventricle has normal function. The left ventricle has no regional wall motion abnormalities. Left ventricular diastolic function could not be evaluated.  2. Right ventricular systolic function is normal. The right ventricular size is normal.  3. The mitral valve is normal in structure. Trivial mitral valve regurgitation. No evidence of mitral stenosis.  4. The aortic valve is tricuspid. Aortic valve regurgitation is not visualized. Aortic valve sclerosis is present, with no evidence of aortic valve stenosis.  5. The inferior vena cava is normal in size with greater than 50% respiratory variability, suggesting right atrial pressure of 3 mmHg. Comparison(s): No prior Echocardiogram. FINDINGS  Left Ventricle: Left ventricular ejection fraction, by estimation, is 60 to 65%. The left ventricle has normal function. The left ventricle has no regional wall motion abnormalities. The left ventricular internal cavity size was normal in size. There is  no left  ventricular hypertrophy. Left ventricular diastolic function could not be evaluated due to atrial fibrillation. Left ventricular diastolic function could not be evaluated. Right Ventricle: The right ventricular size is normal. Right ventricular systolic function is normal. Left Atrium: Left atrial size was normal in size. Right Atrium: Right atrial size was normal in size. Pericardium: There is no evidence of pericardial effusion. Mitral Valve: The mitral valve is normal in structure. Trivial mitral valve regurgitation. No evidence of mitral valve stenosis. Tricuspid Valve: The tricuspid valve is normal in structure. Tricuspid valve regurgitation is trivial. No evidence of tricuspid stenosis. Aortic Valve: The aortic valve is tricuspid. Aortic valve regurgitation is not visualized. Aortic valve sclerosis is present, with no evidence of aortic valve stenosis. Pulmonic Valve: The pulmonic valve was normal in structure. Pulmonic valve regurgitation is not visualized. No evidence of pulmonic stenosis. Aorta: The aortic root is normal in size and structure. Venous: The inferior vena cava is normal in size with greater than 50% respiratory variability, suggesting right atrial pressure of 3 mmHg. IAS/Shunts: No atrial level shunt detected by color flow Doppler.  LEFT VENTRICLE PLAX 2D LVIDd:         5.00 cm      Diastology LVIDs:         2.70 cm      LV e' medial:    8.49 cm/s LV PW:         1.20 cm      LV E/e' medial:  9.0 LV IVS:        1.10 cm      LV e' lateral:   11.70 cm/s LVOT diam:     2.25 cm      LV E/e' lateral: 6.6 LV SV:         76 LV SV Index:   35 LVOT Area:     3.98 cm  LV Volumes (MOD) LV vol d, MOD A2C: 105.0 ml LV vol d, MOD A4C: 77.6 ml LV vol s, MOD A2C: 40.7 ml LV vol s, MOD A4C: 32.9 ml LV SV MOD A2C:     64.3 ml LV SV MOD A4C:     77.6 ml LV SV MOD BP:      55.6 ml RIGHT VENTRICLE             IVC RV Basal diam:  4.10 cm     IVC diam: 1.20 cm RV S prime:     12.10 cm/s TAPSE (M-mode): 1.8 cm LEFT  ATRIUM             Index        RIGHT ATRIUM           Index LA diam:        4.30 cm 1.97 cm/m   RA Area:     20.00 cm LA Vol (A2C):   72.3 ml 33.06 ml/m  RA Volume:   50.40 ml  23.04 ml/m LA  Vol (A4C):   72.3 ml 33.06 ml/m LA Biplane Vol: 73.1 ml 33.42 ml/m  AORTIC VALVE LVOT Vmax:   85.80 cm/s LVOT Vmean:  61.100 cm/s LVOT VTI:    0.192 m  AORTA Ao Root diam: 3.70 cm Ao Asc diam:  3.40 cm MITRAL VALVE MV Area (PHT): 3.24 cm    SHUNTS MV Decel Time: 234 msec    Systemic VTI:  0.19 m MV E velocity: 76.70 cm/s  Systemic Diam: 2.25 cm Olga Millers MD Electronically signed by Olga Millers MD Signature Date/Time: 11/25/2022/2:43:27 PM    Final    VAS US CAROTID  Result Date: 11/25/2022 Carotid Arterial Duplex Study Patient Name:  MEREDITH HOESE  Date of Exam:   11/25/2022 Medical Rec #: [id number]       Accession #:    [id number] Date of Birth: [birth year]       Patient Gender: M Patient Age:   65 years Exam Location:  [facility] Procedure:      VAS US CAROTID Referring Phys: Bess Harvest [facility] --------------------------------------------------------------------------------  Indications:       CVA and Weakness. Risk Factors:      Hypertension, hyperlipidemia. Other Factors:     Atrial fibrillation since age of 74, on Xarelto. OSA. Limitations        Today's exam was limited due to the body habitus of the                    patient, the patient's respiratory variation and atrial                    fibrillation. Comparison Study:  No prior study on file Performing Technologist: Sherren Kerns RVS  Examination Guidelines: A complete evaluation includes B-mode imaging, spectral Doppler, color Doppler, and power Doppler as needed of all accessible portions of each vessel. Bilateral testing is considered an integral part of a complete examination. Limited examinations for reoccurring indications may be performed as noted.  Right Carotid Findings:  +----------+--------+--------+--------+------------------+------------------+           PSV cm/sEDV cm/sStenosisPlaque DescriptionComments           +----------+--------+--------+--------+------------------+------------------+ CCA Prox  86      16                                intimal thickening +----------+--------+--------+--------+------------------+------------------+ CCA Distal91      13                                intimal thickening +----------+--------+--------+--------+------------------+------------------+ ICA Prox  72      23      1-39%   heterogenous                         +----------+--------+--------+--------+------------------+------------------+ ICA Mid   89      21                                                   +----------+--------+--------+--------+------------------+------------------+ ICA Distal76      14                                                   +----------+--------+--------+--------+------------------+------------------+  ECA       76      10                                                   +----------+--------+--------+--------+------------------+------------------+ +----------+--------+-------+--------+-------------------+           PSV cm/sEDV cmsDescribeArm Pressure (mmHG) +----------+--------+-------+--------+-------------------+ [id number]                                        +----------+--------+-------+--------+-------------------+ +---------+--------+--+--------+-+ VertebralPSV cm/s35EDV cm/s6 +---------+--------+--+--------+-+  Left Carotid Findings: +----------+--------+--------+--------+------------------+------------------+           PSV cm/sEDV cm/sStenosisPlaque DescriptionComments           +----------+--------+--------+--------+------------------+------------------+ CCA Prox  103     24                                intimal thickening  +----------+--------+--------+--------+------------------+------------------+ CCA Distal73      12                                intimal thickening +----------+--------+--------+--------+------------------+------------------+ ICA Prox  69      21      1-39%   calcific                             +----------+--------+--------+--------+------------------+------------------+ ICA Mid   71      22                                                   +----------+--------+--------+--------+------------------+------------------+ ICA Distal92      27                                                   +----------+--------+--------+--------+------------------+------------------+ ECA       75      11                                                   +----------+--------+--------+--------+------------------+------------------+ +----------+--------+--------+--------+-------------------+           PSV cm/sEDV cm/sDescribeArm Pressure (mmHG) +----------+--------+--------+--------+-------------------+ [id number]                                          +----------+--------+--------+--------+-------------------+ +---------+--------+--+--------+--+ VertebralPSV cm/s47EDV cm/s14 +---------+--------+--+--------+--+   Summary: Right Carotid: Velocities in the right ICA are consistent with a 1-39% stenosis. Left Carotid: Velocities in the left ICA are consistent with a 1-39% stenosis. Vertebrals:  Bilateral vertebral arteries demonstrate antegrade flow. Subclavians: Normal flow hemodynamics were seen in bilateral subclavian  arteries. *See table(s) above for measurements and observations.     Preliminary    MR ANGIO HEAD WO CONTRAST  Result Date: 11/25/2022 CLINICAL DATA:  Stroke, follow up. EXAM: MRA HEAD WITHOUT CONTRAST TECHNIQUE: Angiographic images of the Circle of Willis were acquired using MRA technique without intravenous contrast. COMPARISON:  CTA head and neck  02/06/2017.  MRA head 12/27/2016. FINDINGS: Anterior circulation: The internal carotid arteries are widely patent from skull base to carotid termini. ACAs and MCAs are patent without evidence of a proximal branch occlusion or significant proximal stenosis. No aneurysm is identified. Posterior circulation: The included portions of the intracranial vertebral arteries are widely patent to the basilar. The basilar artery is widely patent. Posterior communicating arteries are diminutive or absent. Both PCAs are patent without evidence of a significant proximal stenosis. No aneurysm is identified. Anatomic variants: None. IMPRESSION: Negative head MRA. Electronically Signed   By: Sebastian Ache M.D.   On: 11/25/2022 10:42   MR BRAIN WO CONTRAST  Result Date: 11/24/2022 CLINICAL DATA:  Transient ischemic attack EXAM: MRI HEAD WITHOUT CONTRAST TECHNIQUE: Multiplanar, multiecho pulse sequences of the brain and surrounding structures were obtained without intravenous contrast. COMPARISON:  12/27/2016 FINDINGS: Brain: Punctate focus of acute/early subacute ischemia in the right frontal white matter. No acute or chronic hemorrhage. There is multifocal hyperintense T2-weighted signal within the white matter. Generalized volume loss. The midline structures are normal. Vascular: Major flow voids are preserved. Skull and upper cervical spine: Normal calvarium and skull base. Visualized upper cervical spine and soft tissues are normal. Sinuses/Orbits:No paranasal sinus fluid levels or advanced mucosal thickening. No mastoid or middle ear effusion. Normal orbits. IMPRESSION: 1. Punctate focus of acute/early subacute ischemia in the right frontal white matter. No hemorrhage or mass effect. 2. Chronic small vessel ischemia and volume loss. Electronically Signed   By: Deatra Robinson M.D.   On: 11/24/2022 23:03   CT HEAD CODE STROKE WO CONTRAST  Result Date: 11/24/2022 CLINICAL DATA:  Code stroke. Neuro deficit, acute, stroke  suspected. Left-sided weakness. EXAM: CT HEAD WITHOUT CONTRAST TECHNIQUE: Contiguous axial images were obtained from the base of the skull through the vertex without intravenous contrast. RADIATION DOSE REDUCTION: This exam was performed according to the departmental dose-optimization program which includes automated exposure control, adjustment of the mA and/or kV according to patient size and/or use of iterative reconstruction technique. COMPARISON:  Head CT 11/02/2018 FINDINGS: Brain: Generalized parenchymal atrophy. Commensurate prominence of the ventricles and sulci. Patchy and ill-defined hypoattenuation within the cerebral white matter, nonspecific but compatible with mild chronic small vessel ischemic disease. There is no acute intracranial hemorrhage. No demarcated cortical infarct. No extra-axial fluid collection. No evidence of an intracranial mass. No midline shift. Vascular: No hyperdense vessel. Atherosclerotic calcifications. Skull: No fracture or aggressive osseous lesion. Sinuses/Orbits: No mass or acute finding within the imaged orbits. No significant paranasal sinus disease at the imaged levels. ASPECTS [facility] Stroke Program Early CT Score) - Ganglionic level infarction (caudate, lentiform nuclei, internal capsule, insula, M1-M3 cortex): 7 - Supraganglionic infarction (M4-M6 cortex): 3 Total score (0-10 with 10 being normal): 10 No evidence of an acute intracranial abnormality. These results were called by telephone at the time of interpretation on 11/24/2022 at 1:07 pm to provider Dr. Jearld Fenton, who verbally acknowledged these results. IMPRESSION: 1.  No evidence of an acute intracranial abnormality. 2. Parenchymal atrophy and chronic small vessel ischemic disease. Electronically Signed   By: Jackey Loge D.O.   On: 11/24/2022 13:07  Micro Results    No results found for this or any previous visit (from the past 240 hour(s)).  Today   Subjective    Casey [name] today has no headache,no  chest abdominal pain,no new weakness tingling or numbness, feels much better wants to go home today.    Objective   Blood pressure (!) 156/89, pulse 62, temperature 98.1 F (36.7 C), temperature source Oral, resp. rate 17, height 6' (1.829 m), weight 96.5 kg, SpO2 99 %.   Intake/Output Summary (Last 24 hours) at 11/26/2022 0823 Last data filed at 11/26/2022 0500 Gross per 24 hour  Intake --  Output 525 ml  Net -525 ml    Exam  Awake Alert, No new F.N deficits,    [place].AT,PERRAL Supple Neck,   Symmetrical Chest wall movement, Good air movement bilaterally, CTAB RRR,No Gallops,   +ve B.Sounds, Abd Soft, Non tender,  No Cyanosis, Clubbing or edema    Data Review   Recent Labs  Lab 11/24/22 1252 11/24/22 1433  WBC 7.2 7.3  HGB 14.2 14.2  HCT 42.2 42.1  PLT 162 153  MCV 94.6 95.2  MCH 31.8 32.1  MCHC 33.6 33.7  RDW 13.2 13.2  LYMPHSABS 0.9  --   MONOABS 0.5  --   EOSABS 0.2  --   BASOSABS 0.0  --     Recent Labs  Lab 11/24/22 1252 11/25/22 0754 11/26/22 0429  NA 138  --  142  K 3.9  --  3.5  CL 104  --  104  CO2 26  --  26  ANIONGAP 8  --  12  GLUCOSE 103*  --  93  BUN 21  --  14  CREATININE 1.38*  --  1.24  AST 18  --   --   ALT 13  --   --   ALKPHOS 70  --   --   BILITOT 0.8  --   --   ALBUMIN 3.9  --   --   INR 1.5*  --   --   TSH  --  2.288  --   HGBA1C 5.2  --   --   MG  --   --  1.8  CALCIUM 8.8*  --  8.9    Total Time in preparing paper work, data evaluation and todays exam - 35 minutes  Signature  -    Susa Raring M.D on 11/26/2022 at 8:23 AM   -  To page go to [email or web address]

## 2022-11-26 NOTE — Discharge Instructions (Signed)
Currently remain compliant with your nighttime CPAP and Xarelto, follow-up with recommended neurologist and cardiologist within 1 to 2 weeks of discharge.  Follow with Primary MD Assunta Found, MD in 7 days   Get CBC, CMP, 2 view Chest X ray -  checked next visit with your primary MD   Activity: As tolerated with Full fall precautions use walker/cane & assistance as needed  Disposition Home   Diet: Dysphagia 3 diet with feeding assistance and aspiration precautions.  Special Instructions: If you have smoked or chewed Tobacco  in the last 2 yrs please stop smoking, stop any regular Alcohol  and or any Recreational drug use.  On your next visit with your primary care physician please Get Medicines reviewed and adjusted.  Please request your Prim.MD to go over all Hospital Tests and Procedure/Radiological results at the follow up, please get all Hospital records sent to your Prim MD by signing hospital release before you go home.  If you experience worsening of your admission symptoms, develop shortness of breath, life threatening emergency, suicidal or homicidal thoughts you must seek medical attention immediately by calling 911 or calling your MD immediately  if symptoms less severe.  You Must read complete instructions/literature along with all the possible adverse reactions/side effects for all the Medicines you take and that have been prescribed to you. Take any new Medicines after you have completely understood and accpet all the possible adverse reactions/side effects.

## 2022-12-03 ENCOUNTER — Other Ambulatory Visit: Payer: Self-pay | Admitting: Psychiatry

## 2022-12-03 DIAGNOSIS — I639 Cerebral infarction, unspecified: Secondary | ICD-10-CM | POA: Diagnosis not present

## 2022-12-03 DIAGNOSIS — F411 Generalized anxiety disorder: Secondary | ICD-10-CM

## 2022-12-03 DIAGNOSIS — E6609 Other obesity due to excess calories: Secondary | ICD-10-CM | POA: Diagnosis not present

## 2022-12-03 DIAGNOSIS — F3110 Bipolar disorder, current episode manic without psychotic features, unspecified: Secondary | ICD-10-CM

## 2022-12-03 DIAGNOSIS — N183 Chronic kidney disease, stage 3 unspecified: Secondary | ICD-10-CM | POA: Diagnosis not present

## 2022-12-03 DIAGNOSIS — Z6830 Body mass index (BMI) 30.0-30.9, adult: Secondary | ICD-10-CM | POA: Diagnosis not present

## 2022-12-19 ENCOUNTER — Encounter: Payer: Self-pay | Admitting: Internal Medicine

## 2022-12-23 ENCOUNTER — Other Ambulatory Visit: Payer: Self-pay | Admitting: Psychiatry

## 2022-12-23 DIAGNOSIS — F3131 Bipolar disorder, current episode depressed, mild: Secondary | ICD-10-CM

## 2022-12-29 ENCOUNTER — Emergency Department (HOSPITAL_COMMUNITY): Payer: Medicare Other

## 2022-12-29 ENCOUNTER — Emergency Department (HOSPITAL_COMMUNITY)
Admission: EM | Admit: 2022-12-29 | Discharge: 2022-12-29 | Disposition: A | Discharge status: Home / Self Care | Payer: Medicare Other | Attending: Emergency Medicine | Admitting: Emergency Medicine

## 2022-12-29 ENCOUNTER — Other Ambulatory Visit: Payer: Self-pay

## 2022-12-29 ENCOUNTER — Encounter (HOSPITAL_COMMUNITY): Payer: Self-pay | Admitting: Emergency Medicine

## 2022-12-29 ENCOUNTER — Other Ambulatory Visit: Payer: Self-pay | Admitting: Psychiatry

## 2022-12-29 DIAGNOSIS — R059 Cough, unspecified: Secondary | ICD-10-CM | POA: Diagnosis not present

## 2022-12-29 DIAGNOSIS — Z7901 Long term (current) use of anticoagulants: Secondary | ICD-10-CM | POA: Diagnosis not present

## 2022-12-29 DIAGNOSIS — I4891 Unspecified atrial fibrillation: Secondary | ICD-10-CM | POA: Insufficient documentation

## 2022-12-29 DIAGNOSIS — R5383 Other fatigue: Secondary | ICD-10-CM | POA: Diagnosis not present

## 2022-12-29 DIAGNOSIS — I10 Essential (primary) hypertension: Secondary | ICD-10-CM | POA: Diagnosis not present

## 2022-12-29 DIAGNOSIS — R042 Hemoptysis: Secondary | ICD-10-CM | POA: Insufficient documentation

## 2022-12-29 DIAGNOSIS — F3110 Bipolar disorder, current episode manic without psychotic features, unspecified: Secondary | ICD-10-CM

## 2022-12-29 DIAGNOSIS — F411 Generalized anxiety disorder: Secondary | ICD-10-CM

## 2022-12-29 DIAGNOSIS — J209 Acute bronchitis, unspecified: Secondary | ICD-10-CM

## 2022-12-29 LAB — CBC WITH DIFFERENTIAL/PLATELET
Abs Immature Granulocytes: 0.02 10*3/uL (ref 0.00–0.07)
Basophils Absolute: 0 10*3/uL (ref 0.0–0.1)
Basophils Relative: 0 %
Eosinophils Absolute: 0.2 10*3/uL (ref 0.0–0.5)
Eosinophils Relative: 3 %
HCT: 40.1 % (ref 39.0–52.0)
Hemoglobin: 13.5 g/dL (ref 13.0–17.0)
Immature Granulocytes: 0 %
Lymphocytes Relative: 12 %
Lymphs Abs: 0.7 10*3/uL (ref 0.7–4.0)
MCH: 31.9 pg (ref 26.0–34.0)
MCHC: 33.7 g/dL (ref 30.0–36.0)
MCV: 94.8 fL (ref 80.0–100.0)
Monocytes Absolute: 0.5 10*3/uL (ref 0.1–1.0)
Monocytes Relative: 9 %
Neutro Abs: 4.3 10*3/uL (ref 1.7–7.7)
Neutrophils Relative %: 76 %
Platelets: 120 10*3/uL — ABNORMAL LOW (ref 150–400)
RBC: 4.23 MIL/uL (ref 4.22–5.81)
RDW: 13.2 % (ref 11.5–15.5)
WBC: 5.7 10*3/uL (ref 4.0–10.5)
nRBC: 0 % (ref 0.0–0.2)

## 2022-12-29 LAB — BASIC METABOLIC PANEL
Anion gap: 10 (ref 5–15)
BUN: 20 mg/dL (ref 8–23)
CO2: 24 mmol/L (ref 22–32)
Calcium: 8.4 mg/dL — ABNORMAL LOW (ref 8.9–10.3)
Chloride: 104 mmol/L (ref 98–111)
Creatinine, Ser: 1.23 mg/dL (ref 0.61–1.24)
GFR, Estimated: >60 mL/min (ref >60)
Glucose, Bld: 104 mg/dL — ABNORMAL HIGH (ref 70–99)
Potassium: 3.8 mmol/L (ref 3.5–5.1)
Sodium: 138 mmol/L (ref 135–145)

## 2022-12-29 LAB — PROTIME-INR
INR: 2.2 — ABNORMAL HIGH (ref 0.8–1.2)
Prothrombin Time: 24.8 seconds — ABNORMAL HIGH (ref 11.4–15.2)

## 2022-12-29 MED ORDER — ALBUTEROL SULFATE HFA 108 (90 BASE) MCG/ACT IN AERS
2.0000 | INHALATION_SPRAY | RESPIRATORY_TRACT | Status: DC | PRN
  Administered 2022-12-29: 2 via RESPIRATORY_TRACT
  Filled 2022-12-29: qty 6.7

## 2022-12-29 MED ORDER — DOXYCYCLINE HYCLATE 100 MG PO CAPS: 100.0000 mg | ORAL_CAPSULE | Freq: Two times a day (BID) | ORAL | 0 refills | Status: DC

## 2022-12-29 MED ORDER — DOXYCYCLINE HYCLATE 100 MG PO TABS
100.0000 mg | ORAL_TABLET | Freq: Once | ORAL | Status: AC
  Administered 2022-12-29: 100 mg via ORAL
  Filled 2022-12-29: qty 1

## 2022-12-29 MED ORDER — PREDNISONE 20 MG PO TABS
40.0000 mg | ORAL_TABLET | Freq: Once | ORAL | Status: AC
  Administered 2022-12-29: 40 mg via ORAL
  Filled 2022-12-29: qty 2

## 2022-12-29 MED ORDER — IOHEXOL 350 MG/ML SOLN
75.0000 mL | Freq: Once | INTRAVENOUS | Status: AC | PRN
  Administered 2022-12-29: 75 mL via INTRAVENOUS

## 2022-12-29 MED ORDER — PREDNISONE 20 MG PO TABS: 40.0000 mg | ORAL_TABLET | Freq: Every day | ORAL | 0 refills | Status: DC

## 2022-12-29 NOTE — ED Triage Notes (Signed)
Pt reports coughing up blood since yesterday. Denies chest pain, shortness of breath, or dizziness. Endorses congestion and fatigue. On blood thinners for afib.

## 2022-12-29 NOTE — ED Provider Notes (Signed)
[place] EMERGENCY DEPARTMENT AT [facility] Provider Note   CSN: [id number] Arrival date & time: 12/29/22  0241     History  Chief Complaint  Patient presents with   Hemoptysis    Casey [name] is a 77 y.o. male.  Patient reports that he has been coughing up blood.  He has been experiencing cough for approximately the last week.  Patient with a history of atrial fibrillation, on Xarelto.  Reports that he coughed several times tonight and there was blood in the mucus that he brought up.  Initially he thought it might have been from his sinuses because he has a lot of sinus congestion as well.       Home Medications Prior to Admission medications   Medication Sig Start Date End Date Taking? Authorizing Provider  pramipexole (MIRAPEX) 0.25 MG tablet TAKE 1 & 1/2 (ONE & ONE-HALF) TABLET TWICE DAILY 12/23/22   Cottle, Steva Ready., MD  acetaminophen (TYLENOL) 500 MG tablet Take 1,000 mg by mouth every 6 (six) hours as needed for headache.    [provider]  cholecalciferol (VITAMIN D) 1000 UNITS tablet Take 2,000 Units by mouth daily.    [provider]  levothyroxine (SYNTHROID, LEVOTHROID) 25 MCG tablet Take 25 mcg by mouth daily before breakfast. 04/06/15   [provider]  metoprolol succinate (TOPROL-XL) 25 MG 24 hr tablet Take 1 tablet (25 mg total) by mouth daily. 08/21/22   Croitoru, Mihai, MD  OLANZapine (ZYPREXA) 5 MG tablet TAKE 1 TABLET BY MOUTH AT BEDTIME 12/03/22   Cottle, Steva Ready., MD  pantoprazole (PROTONIX) 40 MG tablet TAKE ONE TABLET BY MOUTH ONCE DAILY Patient taking differently: Take 40 mg by mouth daily. 03/07/17   Anice Paganini, NP  rivaroxaban (XARELTO) 20 MG TABS tablet Take 20 mg by mouth daily after supper.     [provider]  rosuvastatin (CRESTOR) 20 MG tablet Take 1 tablet (20 mg total) by mouth daily. 11/26/22   Leroy Sea, MD  SUPER B COMPLEX/C PO Take 1 tablet by mouth daily.    [provider]      Allergies    Amlodipine and Vicodin [hydrocodone-acetaminophen]    Review of Systems   Review of Systems  Physical Exam Updated Vital Signs BP (!) 147/83   Pulse 77   Temp 97.8 F (36.6 C)   Resp (!) 23   Ht 6' (1.829 m)   Wt 108.9 kg   SpO2 95%   BMI 32.55 kg/m  Physical Exam Vitals and nursing note reviewed.  Constitutional:      General: He is not in acute distress.    Appearance: He is well-developed.  HENT:     Head: Normocephalic and atraumatic.     Mouth/Throat:     Mouth: Mucous membranes are moist.  Eyes:     General: Vision grossly intact. Gaze aligned appropriately.     Extraocular Movements: Extraocular movements intact.     Conjunctiva/sclera: Conjunctivae normal.  Cardiovascular:     Rate and Rhythm: Normal rate and regular rhythm.     Pulses: Normal pulses.     Heart sounds: Normal heart sounds, S1 normal and S2 normal. No murmur heard.    No friction rub. No gallop.  Pulmonary:     Effort: Pulmonary effort is normal. No respiratory distress.     Breath sounds: Normal breath sounds.  Abdominal:     Palpations: Abdomen is soft.  Tenderness: There is no abdominal tenderness. There is no guarding or rebound.     Hernia: No hernia is present.  Musculoskeletal:        General: No swelling.     Cervical back: Full passive range of motion without pain, normal range of motion and neck supple. No pain with movement, spinous process tenderness or muscular tenderness. Normal range of motion.     Right lower leg: No edema.     Left lower leg: No edema.  Skin:    General: Skin is warm and dry.     Capillary Refill: Capillary refill takes less than 2 seconds.     Findings: No ecchymosis, erythema, lesion or wound.  Neurological:     Mental Status: He is alert and oriented to person, place, and time.     GCS: GCS eye subscore is 4. GCS verbal subscore is 5. GCS motor subscore is 6.     Cranial Nerves: Cranial nerves 2-12 are intact.     Sensory:  Sensation is intact.     Motor: Motor function is intact. No weakness or abnormal muscle tone.     Coordination: Coordination is intact.  Psychiatric:        Mood and Affect: Mood normal.        Speech: Speech normal.        Behavior: Behavior normal.     ED Results / Procedures / Treatments   Labs (all labs ordered are listed, but only abnormal results are displayed) Labs Reviewed  CBC WITH DIFFERENTIAL/PLATELET - Abnormal; Notable for the following components:      Result Value   Platelets 120 (*)    All other components within normal limits  BASIC METABOLIC PANEL - Abnormal; Notable for the following components:   Glucose, Bld 104 (*)    Calcium 8.4 (*)    All other components within normal limits  PROTIME-INR - Abnormal; Notable for the following components:   Prothrombin Time 24.8 (*)    INR 2.2 (*)    All other components within normal limits    EKG EKG Interpretation  Date/Time:  Sunday December 29 2022 03:38:44 EDT Ventricular Rate:  99 PR Interval:    QRS Duration: 96 QT Interval:  339 QTC Calculation: 435 R Axis:   77 Text Interpretation: Atrial fibrillation Minimal ST depression, diffuse leads Confirmed by Gilda Crease [phone]) on 12/29/2022 3:59:36 AM  Radiology CT Angio Chest Pulmonary Embolism (PE) W or WO Contrast  Result Date: 12/29/2022 CLINICAL DATA:  77 year old male history of hemoptysis. Congestion and fatigue. EXAM: CT ANGIOGRAPHY CHEST WITH CONTRAST TECHNIQUE: Multidetector CT imaging of the chest was performed using the standard protocol during bolus administration of intravenous contrast. Multiplanar CT image reconstructions and MIPs were obtained to evaluate the vascular anatomy. RADIATION DOSE REDUCTION: This exam was performed according to the departmental dose-optimization program which includes automated exposure control, adjustment of the mA and/or kV according to patient size and/or use of iterative reconstruction technique. CONTRAST:   75mL OMNIPAQUE IOHEXOL 350 MG/ML SOLN COMPARISON:  None available. FINDINGS: Cardiovascular: No filling defects within the pulmonary arterial tree to suggest pulmonary embolism. Heart size is mildly enlarged with left atrial dilatation. Trace amount of anterior pericardial fluid and/or thickening, unlikely to be of any hemodynamic significance at this time. No pericardial calcification. There is aortic atherosclerosis, as well as atherosclerosis of the great vessels of the mediastinum and the coronary arteries, including calcified atherosclerotic plaque in the left anterior descending coronary artery. Mediastinum/Nodes:  No pathologically enlarged mediastinal or hilar lymph nodes. Small hiatal hernia. No axillary lymphadenopathy. Lungs/Pleura: Small chronic partially loculated likely chronic left pleural effusion with widespread areas of mild pleural thickening in the left hemithorax. Associated with this there are areas of pleuroparenchymal thickening and architectural distortion in the periphery of the left lung, most notable in the left upper lobe where there is a peripheral mass-like area (axial image 79 of series 6) with "pleural tails", which has a characteristic appearance most likely to reflect rounded atelectasis. No right pleural effusion. Patchy areas of peribronchovascular ground-glass attenuation are noted in the lungs bilaterally, most evident in the right upper lobe and posterior aspect of the left upper lobe. Upper Abdomen: Aortic atherosclerosis. Noncalcified gallstone lying dependently in the gallbladder measuring 1.6 cm in diameter. Small splenule adjacent to the anterior aspect of the spleen incidentally noted. Musculoskeletal: Multiple old healed left-sided rib fractures with posttraumatic deformity in the left chest wall. There are no aggressive appearing lytic or blastic lesions noted in the visualized portions of the skeleton. Review of the MIP images confirms the above findings. IMPRESSION:  1. No evidence of pulmonary embolism. 2. Patchy areas of peribronchovascular ground-glass attenuation in the lungs bilaterally, which could reflect early or mild acute bronchopneumonia. 3. Extensive remote posttraumatic changes in the left hemithorax with probable chronic small left pleural effusion and extensive pleuroparenchymal scarring, including what appears to be a large area of rounded atelectasis in the left upper lobe. Follow-up noncontrast chest CT is recommended in 3-6 months to ensure the stability of this finding and establish a baseline for future follow-up imaging. 4. Aortic atherosclerosis, in addition to left anterior descending coronary artery disease. Assessment for potential risk factor modification, dietary therapy or pharmacologic therapy may be warranted, if clinically indicated. 5. Cardiomegaly with left atrial dilatation. Aortic Atherosclerosis (ICD10-I70.0). Electronically Signed   By: Trudie Reed M.D.   On: 12/29/2022 06:34    Procedures Procedures    Medications Ordered in ED Medications  doxycycline (VIBRA-TABS) tablet 100 mg (has no administration in time range)  predniSONE (DELTASONE) tablet 40 mg (has no administration in time range)  albuterol (VENTOLIN HFA) 108 (90 Base) MCG/ACT inhaler 2 puff (has no administration in time range)  iohexol (OMNIPAQUE) 350 MG/ML injection 75 mL (75 mLs Intravenous Contrast Given 12/29/22 0524)    ED Course/ Medical Decision Making/ A&P                             Medical Decision Making Amount and/or Complexity of Data Reviewed Labs: ordered. Radiology: ordered.  Risk Prescription drug management.   Differential diagnosis considered includes, but not limited to: Pneumonia; bronchitis; pulmonary embolism; pulmonary hemorrhage secondary to anticoagulation  Presents to the emergency department stating that he has had a cough for about a week and now his cough has produced sputum that has blood in it.  Patient in no  distress at arrival.  He appears to be breathing comfortably.  He is afebrile, normotensive/slightly hypertensive.  Patient is not anemic, basic blood work is unremarkable.  CT angiography performed.  No evidence of PE.  No other are some groundglass findings, this could possibly be hemorrhage.  He has not had any hemoptysis here in the department.  He has been coughing quite a bit, bringing up a small amount of clear mucus.  Discussed with him admission versus outpatient management.  He feels like he is doing better, would like to go home.  I feel that this is reasonable as he is reliable and will return if symptoms worsen.  Will hold his Xarelto for 2 days.  Treat for acute bronchitis.  Return to the ER immediately for worsening symptoms.        Final Clinical Impression(s) / ED Diagnoses Final diagnoses:  Hemoptysis  Acute bronchitis, unspecified organism    Rx / DC Orders ED Discharge Orders     None         Kamari Buch, Canary Brim, MD 12/29/22 [phone]

## 2022-12-29 NOTE — Discharge Instructions (Signed)
Do not take your Xarelto today or tomorrow.  Restart it Tuesday.  If you have increasing bleeding, shortness of breath, return immediately to the ER.

## 2022-12-31 ENCOUNTER — Ambulatory Visit: Payer: Medicare Other | Admitting: Psychiatry

## 2023-01-08 ENCOUNTER — Ambulatory Visit: Payer: Medicare Other | Admitting: Adult Health

## 2023-01-08 ENCOUNTER — Encounter: Payer: Self-pay | Admitting: Adult Health

## 2023-01-08 VITALS — BP 135/89 | HR 76 | Ht 72.0 in | Wt 216.0 lb

## 2023-01-08 DIAGNOSIS — Z8673 Personal history of transient ischemic attack (TIA), and cerebral infarction without residual deficits: Secondary | ICD-10-CM | POA: Diagnosis not present

## 2023-01-08 DIAGNOSIS — I4821 Permanent atrial fibrillation: Secondary | ICD-10-CM

## 2023-01-08 DIAGNOSIS — E785 Hyperlipidemia, unspecified: Secondary | ICD-10-CM

## 2023-01-08 NOTE — Progress Notes (Signed)
I agree with the above plan 

## 2023-01-08 NOTE — Progress Notes (Signed)
PATIENT: Casey [name] DOB: [birth year]  REASON FOR VISIT: follow up HISTORY FROM: patient PRIMARY NEUROLOGIST: Dr. Pearlean Brownie  Chief Complaint  Patient presents with   Follow-up    Pt in 9 with wife Pt here for stroke f/u Pt states increased fatigue      HISTORY OF PRESENT ILLNESS: Today 01/08/23  Casey [name] is a 77 y.o. male hospital follow-up for acute right frontal juxtacortical infarct with slightly to A-fib due to noncompliance with Xarelto.  Patient is here with his wife today.  He feels that he is returned to his baseline.  He has continued on Xarelto.  While in the hospital they did educate the patient on how to take this medication.  He was skipping doses which his cardiologist felt may have caused this event.  He has an appointment with his cardiologist in October.  Was placed on Crestor while in the hospital.  He returns today for an evaluation.  HISTORY Mr. Casey [name] is a 77 y.o. 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 LKW 1200.    Stroke: Acute right frontal juxtacortical small infarct, could be from A-fib even on Xarelto Code Stroke CT head No acute abnormality. Small vessel disease. Atrophy. ASPECTS 10.    MRI small acute/subacute right frontal infarct MRA negative Carotid Doppler unremarkable 2D Echo pending LDL 100 HgbA1c 5.2 UDS neg VTE prophylaxis -SCDs Xarelto (rivaroxaban) daily prior to admission, recommend to switch to Eliquis given possible Xarelto failure. (Of note, pt just got 90 day supply for Xarelto, and he and his wife want to finish off Xarelto first and then switch to Xarelto. I told them that is less ideal but I will respect with their decision.) Therapy recommendations: outpt OT Disposition: Pending  REVIEW OF SYSTEMS: Out of a complete 14 system review of symptoms, the patient complains only of the following symptoms, and all other reviewed  systems are negative.  ALLERGIES: Allergies  Allergen Reactions   Amlodipine Other (See Comments)    Stopped due to kidney issues   Vicodin [Hydrocodone-Acetaminophen] Other (See Comments)    Patient is unsure of reaction    HOME MEDICATIONS: Outpatient Medications Prior to Visit  Medication Sig Dispense Refill   acetaminophen (TYLENOL) 500 MG tablet Take 1,000 mg by mouth every 6 (six) hours as needed for headache.     cholecalciferol (VITAMIN D) 1000 UNITS tablet Take 2,000 Units by mouth daily.     doxycycline (VIBRAMYCIN) 100 MG capsule Take 1 capsule (100 mg total) by mouth 2 (two) times daily. 20 capsule 0   levothyroxine (SYNTHROID, LEVOTHROID) 25 MCG tablet Take 25 mcg by mouth daily before breakfast.     metoprolol succinate (TOPROL-XL) 25 MG 24 hr tablet Take 1 tablet (25 mg total) by mouth daily. 90 tablet 3   OLANZapine (ZYPREXA) 5 MG tablet TAKE 1 TABLET BY MOUTH AT BEDTIME 30 tablet 2   pantoprazole (PROTONIX) 40 MG tablet TAKE ONE TABLET BY MOUTH ONCE DAILY (Patient taking differently: Take 40 mg by mouth daily.) 30 tablet 11   pramipexole (MIRAPEX) 0.25 MG tablet TAKE 1 & 1/2 (ONE & ONE-HALF) TABLET TWICE DAILY 90 tablet 0   predniSONE (DELTASONE) 20 MG tablet Take 2 tablets (40 mg total) by mouth daily with breakfast. 8 tablet 0   rivaroxaban (XARELTO) 20 MG TABS tablet Take 20 mg by mouth daily after supper.  rosuvastatin (CRESTOR) 20 MG tablet Take 1 tablet (20 mg total) by mouth daily. 30 tablet 0   SUPER B COMPLEX/C PO Take 1 tablet by mouth daily.     No facility-administered medications prior to visit.    PAST MEDICAL HISTORY: Past Medical History:  Diagnosis Date   Arthritis    Asthma    Atrial fibrillation (HCC) 01/12/2010   2D Echo EF=>55%   Bipolar disorder (HCC)    Chronic back pain    Depression    Dysrhythmia    AFib   GERD (gastroesophageal reflux disease)    History of colonic polyps    History of gout    Hyperlipidemia    Hypertension     Hypothyroidism    Morbid obesity (HCC)    OSA (obstructive sleep apnea)    AHI was 27.62hr RDI was 34.3 hr REM 0.00hr; had CPAP years ago but no longer uses.   Prostatitis    Tubular adenoma of colon 01/2016    PAST SURGICAL HISTORY: Past Surgical History:  Procedure Laterality Date   APPENDECTOMY  1959   asthma     CATARACT EXTRACTION Bilateral 1995   COLONOSCOPY  2011   RMR: left-sided diverticula, minimal rectal friability. No polyps. Repeat 5 years.   COLONOSCOPY WITH PROPOFOL N/A 01/15/2016   Dr.Rourk- diverticulosis in the entire examined colon, multiple rectal and colonic polyps, internal hemorrhoids. bx= tubular adenomas and hyperplastic polyps.    COLONOSCOPY WITH PROPOFOL N/A 03/13/2022   Procedure: COLONOSCOPY WITH PROPOFOL;  Surgeon: Corbin Ade, MD;  Location: [facility];  Service: Endoscopy;  Laterality: N/A;  11:00am   HAND / FINGER LESION EXCISION Right    IR THORACENTESIS ASP PLEURAL SPACE W/IMG GUIDE  03/01/2019   IR THORACENTESIS ASP PLEURAL SPACE W/IMG GUIDE  03/31/2019   POLYPECTOMY  01/15/2016   Procedure: POLYPECTOMY;  Surgeon: Corbin Ade, MD;  Location: [facility];  Service: Endoscopy;;   POLYPECTOMY  03/13/2022   Procedure: POLYPECTOMY;  Surgeon: Corbin Ade, MD;  Location: [facility];  Service: Endoscopy;;   right shoulder surgery     TONSILLECTOMY  1955    FAMILY HISTORY: Family History  Problem Relation Age of Onset   Diabetes Mother    Heart failure Mother    Hypertension Mother    Hypertension Father    Cancer Paternal Grandmother    Stroke Sister    Liver disease Brother    Vascular Disease Brother    Arthritis Brother    Colon cancer Neg Hx     SOCIAL HISTORY: Social History   Socioeconomic History   Marital status: Married    Spouse name: Not on file   Number of children: Not on file   Years of education: Not on file   Highest education level: Not on file  Occupational History   Not on file  Tobacco Use    Smoking status: Never   Smokeless tobacco: Never  Substance and Sexual Activity   Alcohol use: No    Alcohol/week: 0.0 standard drinks of alcohol   Drug use: No   Sexual activity: Yes    Birth control/protection: None  Other Topics Concern   Not on file  Social History Narrative   Not on file   Social Determinants of Health   Financial Resource Strain: Not on file  Food Insecurity: No Food Insecurity (11/24/2022)   Hunger Vital Sign    Worried About Running Out of Food in the Last Year: Never true  Ran Out of Food in the Last Year: Never true  Transportation Needs: No Transportation Needs (11/24/2022)   PRAPARE - Administrator, Civil Service (Medical): No    Lack of Transportation (Non-Medical): No  Physical Activity: Not on file  Stress: Not on file  Social Connections: Not on file  Intimate Partner Violence: Patient Declined (11/24/2022)   Humiliation, Afraid, Rape, and Kick questionnaire    Fear of Current or Ex-Partner: Patient declined    Emotionally Abused: Patient declined    Physically Abused: Patient declined    Sexually Abused: Patient declined      PHYSICAL EXAM  Vitals:   01/08/23 1112  BP: 135/89  Pulse: 76  Weight: 216 lb (98 kg)  Height: 6' (1.829 m)   Body mass index is 29.29 kg/m.  Generalized: Well developed, in no acute distress   Neurological examination  Mentation: Alert oriented to time, place, history taking. Follows all commands speech and language fluent Cranial nerve II-XII: Pupils were equal round reactive to light. Extraocular movements were full, visual field were full on confrontational test. Facial sensation and strength were normal. Uvula tongue midline. Head turning and shoulder shrug  were normal and symmetric.  Mild facial droop on the left Motor: The motor testing reveals 5 over 5 strength of all 4 extremities. Good symmetric motor tone is noted throughout.  Sensory: Sensory testing is intact to soft touch on all 4  extremities. No evidence of extinction is noted.  Coordination: Cerebellar testing reveals good finger-nose-finger and heel-to-shin bilaterally.  Gait and station: Gait is normal.  Reflexes: Deep tendon reflexes are symmetric and normal bilaterally.   DIAGNOSTIC DATA (LABS, IMAGING, TESTING) - I reviewed patient records, labs, notes, testing and imaging myself where available.  Lab Results  Component Value Date   WBC 5.7 12/29/2022   HGB 13.5 12/29/2022   HCT 40.1 12/29/2022   MCV 94.8 12/29/2022   PLT 120 (L) 12/29/2022      Component Value Date/Time   NA 138 12/29/2022 0443   NA 141 12/28/2018 0941   K 3.8 12/29/2022 0443   CL 104 12/29/2022 0443   CO2 24 12/29/2022 0443   GLUCOSE 104 (H) 12/29/2022 0443   BUN 20 12/29/2022 0443   BUN 16 12/28/2018 0941   CREATININE 1.23 12/29/2022 0443   CALCIUM 8.4 (L) 12/29/2022 0443   PROT 7.0 11/24/2022 1252   PROT 6.6 12/28/2018 0941   ALBUMIN 3.9 11/24/2022 1252   ALBUMIN 4.5 12/28/2018 0941   AST 18 11/24/2022 1252   ALT 13 11/24/2022 1252   ALKPHOS 70 11/24/2022 1252   BILITOT 0.8 11/24/2022 1252   BILITOT 0.7 12/28/2018 0941   GFRNONAA >60 12/29/2022 0443   GFRAA 63 12/28/2018 0941   Lab Results  Component Value Date   CHOL 163 11/25/2022   HDL 48 11/25/2022   LDLCALC 100 (H) 11/25/2022   TRIG 73 11/25/2022   CHOLHDL 3.4 11/25/2022   Lab Results  Component Value Date   HGBA1C 5.2 11/24/2022   No results found for: "VITAMINB12" Lab Results  Component Value Date   TSH 2.288 11/25/2022      ASSESSMENT AND PLAN 77 y.o. year old male  has a past medical history of Arthritis, Asthma, Atrial fibrillation (HCC) (01/12/2010), Bipolar disorder (HCC), Chronic back pain, Depression, Dysrhythmia, GERD (gastroesophageal reflux disease), History of colonic polyps, History of gout, Hyperlipidemia, Hypertension, Hypothyroidism, Morbid obesity (HCC), OSA (obstructive sleep apnea), Prostatitis, and Tubular adenoma of colon  (01/2016). here with  Stroke: Acute right frontal juxtacortical small infarct, could be from A-fib even on Xarelto   Continue Xarelto daily  for secondary stroke prevention.  Discussed secondary stroke prevention measures and importance of close PCP follow up for aggressive stroke risk factor management. I have gone over the pathophysiology of stroke, warning signs and symptoms, risk factors and their management in some detail with instructions to go to the closest emergency room for symptoms of concern. HTN: BP goal <130/90.  Monitor BP at home HLD: LDL goal <70. Recent LDL 100 on Crestor.  DMII: A1c goal<7.0. Recent A1c 5.2.  Encouraged patient to monitor diet and encouraged exercise FU with our office PRN    Butch Penny, MSN, NP-C 01/08/2023, 11:36 AM [facility] [address], [address] [place], [place] [address] [phone]

## 2023-01-08 NOTE — Patient Instructions (Signed)
Your Plan:  Continue Xarelto  Blood pressure goal <130/90 Cholesterol LDL goal <70 Diabetes goal A1c <7 Monitor diet and try to exercise   Thank you for coming to see [place] at [facility]. I hope we have been able to provide you high quality care today.  You may receive a patient satisfaction survey over the next few weeks. We would appreciate your feedback and comments so that we may continue to improve ourselves and the health of our patients.

## 2023-01-17 ENCOUNTER — Other Ambulatory Visit: Payer: Self-pay | Admitting: Psychiatry

## 2023-01-17 DIAGNOSIS — F3131 Bipolar disorder, current episode depressed, mild: Secondary | ICD-10-CM

## 2023-02-11 ENCOUNTER — Other Ambulatory Visit: Payer: Self-pay

## 2023-02-27 ENCOUNTER — Encounter: Payer: Self-pay | Admitting: Psychiatry

## 2023-02-27 ENCOUNTER — Ambulatory Visit: Payer: Medicare Other | Admitting: Psychiatry

## 2023-02-27 DIAGNOSIS — F3131 Bipolar disorder, current episode depressed, mild: Secondary | ICD-10-CM

## 2023-02-27 DIAGNOSIS — G2581 Restless legs syndrome: Secondary | ICD-10-CM

## 2023-02-27 DIAGNOSIS — Z8669 Personal history of other diseases of the nervous system and sense organs: Secondary | ICD-10-CM

## 2023-02-27 DIAGNOSIS — F411 Generalized anxiety disorder: Secondary | ICD-10-CM

## 2023-02-27 DIAGNOSIS — F5105 Insomnia due to other mental disorder: Secondary | ICD-10-CM

## 2023-02-27 NOTE — Patient Instructions (Signed)
Stop olanzapine Start Vraylar 1 in the AM Reduce pramipexole to 1 twice daily for 4 days then 1/2 twice daily for 4 days then stop it.

## 2023-02-27 NOTE — Progress Notes (Signed)
Casey [name] [id number] 02-02-1946 77 y.o.    Subjective:   Patient ID:  Casey [name] is a 77 y.o. (DOB [birth year]) male.  Chief Complaint:  Chief Complaint  Patient presents with   Follow-up   Depression    HPI seen with wife Casey [name] presents for follow-up of bipolar disorder and anxiety and history of confusion  At visit  Dec 02, 2018.  He was having confusion and benztropine was stopped to see if that was the cause.  Also it was having balance issues and therefore Trileptal was switched all to nighttime 1200 mg nightly. Confusion much better per both of them with DC benztropine.  Balance better with change Trileptal.   visit was January 13, 2019 and he was doing well as noted above and he was encouraged not to make further med changes at that time.  visit April 13, 2019.  He was doing better with regard to depression than he had in quite some time on the Wellbutrin and no meds were changed.  He was having health problems with pulmonary effusion and fluid removed twice related to fall in January 2020.  seen September 13, 2019.  For residual depression the following decisions were made: Balance problem much improved with dosing Trileptal all in the evening. Depression under partial control & wife wants it to be better per his wife.  No mood swings.   Increase bupropion XL to 2 of the 150 mg tablets in the morning until the current bottle is gone. Then new RX will be 1 of the 300 mg tablets each morning.  November 08, 2019 appointment the following is noted:  Seen with wife, Casey [name]. Small amount of improvement with change.  Anxiety got worse.  Notices when he goes out.  Worries about social and political things.  Wife keeps him active at home.  Allerigies bother him..  No sig caffeine except tea when goes out.  Wife sees him as a little more active and initiative.  More productive.  Started some cooking.  Getting out and up better.  A little more talkative.   "He wants to  be hyper but we know what happens". Wife Casey [name] thinks   But for the last 2 years he's done nothing and laid around all the time.  He's a lot more active now. Big improvement. Went to church first time in 2 years. Plan:  For TRD, Pramipexole 0.25 mg tablet 1/2 twice daily for 1 week then 1 twice daily.  12/20/2019 appointment with the following noted:  Seen with wife. A little better motivated to get out but not to get in crowds.  Wife agrees more motivated and went to church once alone without pressure.  Wife says he's more talkative and much  Better interest.  Still not motivated to do things like mow the grass.  Others's have seen a difference. Concentration and memory are improved but not normal. No Se pramipexole.  Getting out of bed earlier and better now.. Wife admisters meds bc earlier he messsed them up. Plan: Wife has seen a big improvement therefore Partial improvement is clear from this so increase Pramipexole 0.375 mg tablet  twice daily  Anxiety is not fully managed.  Partly related to depression. Tremor is manageable.    02/15/20 appt with the following noted:  Send with wife Casey [name] Doing good.  Increased pramipexole as indicated.  Tolerating meds Wellbutrin XL 300 mg AM & Trileptal 1200 mg HS with it. Much better with depression.  Stays up more and speinding less time in bed.   Wife agrees he's much better and handling things better. 2 1/2 yo 4th cousin girl drowned in pond lately.  Big family. Wife pleased he handled it better. SE constipation ? Related. History Linzess but diarrhea. He's not highly motivated but it is better.  Depression is much improved.  Had been depressed for years. Plan no med changes  06/13/2020 appointment with the following noted: Pretty good but cardiologist noted today increased pulse needing treatment.  Uncontrolled afib even after ablation. Hard to tell about depression bc of fatigue Dt heart problems. Wife said he did well Thanksgiving and  participated more in conversation per wife. Plan: no med changes  09/06/2020 appointment with following noted: Remains on Wellbutrin XL 300 mg every morning, ginkgo biloba 40 mg twice daily, oxcarbazepine 1200 mg nightly, pramipexole 0.75 mg twice daily. Less sig health concerns. He thinks mood has been pretty good.  Consistent with meds. Wife thinks he stays tired and sleepy and wonders if can give him a stimulant.   Sleep good.  Will nap.  Doesn't bother him.  Walks dog 20 min in AM.  Not much initiative per wife.  Not depressed but when depressed he stayed in the bed lasting a couple of years.  History of OSA but lost weight and doesn't think he has apnea anymore.  No CPAP. Chronic afib. Not much caffeine. Plan: Wife& patient has seen a big improvement with Pramipexole 0.375 mg tablet  twice daily They ask consideration be given to the following: If his energy gets better by treating his tachycardia they would like to consider reducing the pramipexole or possibly eliminating it at follow-up.   For energy trial reduction pramipexole to 0.25 mg BID.  If gets more depressed call. Alternative of modafinil 100  12/04/2020 appointment with the following noted: Energy is better with awakening earlier at 8 and doing more and getting out more.  Going to bed later with 8 hour  Sleep. Mood fluctuates with more reacting to wife mainly.  More outspoken.  Per W he has to vent.  She thinks he is too focused on politics and issues.   Plan: Depression under control.  No mood swings.   Continue bupropion XL 300 mg tablets each morning. Continue Trileptal 600 mg 2 at HS. No higher DT anxiety risk and no traditional stimulants DT afib.  Wife & patient has seen a big improvement with Pramipexole and able to reduce to  0.25 mg tablet  twice daily  02/20/2021 appointment with the following noted:  seen with wife Prednisone made him real hyper.  Been off of it for at least a couple weeks.  Hyperverbal and  hyperactive.  Hip pain interferes with sleep.  Sees doctor tomorrow. Average 3-4 hours at night and no naps bc busy.  No excessive spending but more than usual.  Denies appetite disturbance.  Patient reports that energy and motivation have been reduced.  Patient has difficulty with concentration and memory.  Patient denies any suicidal ideation.  W had MI in June. A/P: mild mania Wean Wellbutrin over 2 weeks. Continue Trileptal 600 mg 2 at HS. Wife & patient has seen a big improvement with Pramipexole and able to reduce to  0.25 mg tablet  twice daily. Continue it unless mania does not resolve with less bupropion.  03/26/2021 phone call: Casey [name] stated Kauan has slightly improved since last week.He has not been able to sleep and was up at 2 am texting  people in a disoriented state.His pcp put him on Ambien 10 mg and this has helped him sleep.However,linda stated he is still working him self too much.She said he is jittery and has to have something to do at all times.He does not want to slow down or get rest.She thinks he needs to cutback on the Mirapex as well. MD response:  I agree with his wife about reducing Mirapex from 1 tablet twice a day to half tablet twice a day for 1 week and if his symptoms are not resolved and stop the Mirapex.  04/18/2021 phone call with wife and MD: Patient is still manic after stopping Wellbutrin and pramipexole.  Wife's concern is hard to get him to settle down at night.  Reviewed with wife prior psychiatric medications which are multiple.  Typically one would use an antipsychotic in this case but she does not think that they worked in the past.  She says he has been on higher doses of Trileptal and tolerated it. Increase Trileptal to 600 mg every morning and 1200 mg nightly for mania.  Keep scheduled appointment.  05/02/2021 wife reports patient still manic: We cannot go higher in the Trileptal because of balance issues.  Therefore olanzapine 10 mg nightly was started and  we discussed the option of hospitalization.  We will try to get the patient in urgently to an appointment.  05/07/2021 appointment with the following noted: seen with wife, Casey [name] Not doing too well.  Trouble getting out of bed.  Wife says he's still up in middle of the night but he says he's sleeping better.  Sleep in recliner downstairs. She says he's still manic with hyperactive, hyper graphia but won't show her what he's writing. On olanzapine  No changes off Wellbutrin and pramipexole. Apparently more balance problems with increase Trileptal to 1800 mg daily.  Dropped back to 1200 mg HS. Admits to some confusion.  Has pulled off labels on bottle per wife. She thinks he's averaging 4-6 hours of sleep and a little better than it was. Wife said he started cussing and he has not done that in the past. Other episodes of inappropriate decision making.  He recognizes he's manic but can't control himself.  Hyperverbal, pressured.  In past had speech problems with mania also.  Neg neuro work up for stroke in the past. SE balance and tremor. Some trouble getting out of chair? Plan: For mania,Increase olanzapine to 15 mg PM Follow-up Friday for urgent visit  05/11/2021 appointment with the following noted: Calming down a little. He thinks he's sleeping better.  She says he needs whole Ambien with olanzapine and last night still not sleeping.  He woke her with complaints of back pain. She's not seeing SE and nor is he.  She's not seeing much change  He says back pain is complicating his sleep and his problem. He tries to stay active.   She says he talks incessantly but not as much to her. She says major life events have triggered mania in him in the past. She notices him making poor decisions. Plan: Increase olanzapine to 20 mg PM  05/24/2021 appointment with the following noted: with D Gunnar Fusi A week ago went of to doctor's office alone and wife called bc he was agitated. Per wife still going wide  open but is sleeping better. Yesterday had fever and talking out of his head and dx UTI. He still feels driven to do chores but distractible and inefficient.   D does not see  improvement and nor does wife.   7 hour sleep lately. Plan: Increase olanzapine to 30 mg in evening Reduce oxcarbazepine to 1 and 1/2 tablets in the evening.  06/04/2021 appointment: Seen with wife Sleeping more 7-9 hours.   Per wife still some confusion and still short tempered.  Less pressured sleep. He won't go to bed when sleepy and waits too long to get in bed and is weak and cannot do it by himself. Wife agrees he's less "wide open" than before and will be able to sit and watch TV show now. B died yesterday. Plan: Continue olanzapine to 30 mg in evening Reduce oxcarbazepine  again to 1 tablets in the evening for 1 week, then 1/2 tablet at night for a week then stop it.  07/04/21 appt noted:  seen with wife Casey [name] Hyper-ness is better.  Some trouble with concentration but able to make peanut butter fudge. Problems with afib.   Sleeping a lot and some instability.  Goes to sleep in chair.  Better with irritability.  No longer talking constantly but still some in spells.   Not depressed.   No change in balance off oxcarbazepine. Wife said last week mixed up meds and got oversedated.  She discovered the option. Plan: Reduce olanzapine to 20 mg PM now that mania is largely resolved.  08/06/2021 appointment with the following noted: Only psych med is reduced olanzapine 20 mg HS RLS and fidgety.  All day.  Can sit for an hour.  Uncomfortable when sitting. Stopped Ginko and wife noticed RLS more.  She restarted it.  No longer rocking back and forth. Caffeine tea at lunch.  No coffee..  [place] some. RLS recently started. Bipolar sx great per wife.  Not wanting to go out much. CO tiredness.  Falls asleep in recliner.  Sleep all night. Eats sweets all his life. Plan: No longer manic. Reduce olanzapine to 15 mg  earlier in PM Stop caffeine  09/06/2021 appointment with the following noted: Reduced olanzapine 15 mg and still RLS all the day. No change in that.  But can sit in church ok.  Will move legs when standing and talking to people. Sleeping more than he should from 10 until 9. And still naps and little initiative bc tired. Not depressed but not back to normal.  Not as social as usual. Loves to eat out. Plan: Reduce olanzapine from 15 to 7.5 mg earlier in PM Stop caffeine  10/02/2021 appointment with the following noted: seen again with wife Casey [name] Only psych med is the reduced dose of olanzapine 7.5 mg for 30 days. Back pain limits activity and getting shots. Taking tramadol for pain.  1-2 prn.  One helps. 2 is sedating. RLS is better and mainly now the problem is back pain.  Has to sleep in recliner DT pain.   Casey [name] says hard to tell about effect from reduced olanzapine bc he's having such back pain and taking tramadol.  Doesn't feel like doing anything. Plan continue olanzapine 7.5 mg every afternoon  12/04/2021 appointment with the following noted: seen with wife Still doing well with olanzapine 7.5 mg PM No SE.  RLS is OK now. Sleep good 8-10 hours. Not depressed nor manic sx.   Happy with meds.   Wife gall bladder surgery. H helping wife. Plan: olanzapine 7.5 mg earlier in PM Stopcaffeine   03/19/2022 phone call: Wife called complaining that her husband sleeping 14 to 15 hours a day on olanzapine 7.5 mg daily and wondering about trying  to reduce it. MD response: Olanzapine is his only mood stabilizer at present.  There is a risk that he could get manic again with the dosage reduction.  But we can try reducing from 7.5 to 5 mg at night.  That may or may not help with his tendency to sleep excessively.  Some people will do that even on low doses of olanzapine.  Please send in prescription for olanzapine 5 mg tablets 1 nightly #30, 1 additional refill and make a note in the pharmacy section  of the order to cancel any other olanzapine refills.     06/04/2022 appointment noted:  wife says a tad better with less olanzapine but still wants to sleep and sit around a lot. Keeps saying I'm not depressed.   Reduced olanzapine 5 mg HS. No mania or depression. Hx sleep study and dx OSA but per wife got better with wt loss.  Not using CPAP. Eating less but wt loss stable. Asks about seasonal pattern bc of the history.  She can't remember the seasonal pattern.   Plan: Reduce  DT sleepiness olanzapine 2.5 mg earlier in PM Mild TD   07/29/22 TC: no changes with reduction so increased back to 5 mg olanzapine.  08/14/22 appt noted: Still sleeping too much 12 hours.   Also been irritable with flashes.   Something can set him off. Probably getting depressed some with less olanzapine.  Some anxiety.   Some reduction in interest and not going to church like before and doesn't like to be around people.  Doesn't want wife to help others either.   No restlessness.  Will nap in the afternoon. Not enjoying things and not laughing. Plan: Is getting more depressed again, resume Wellbutrin XL up to 300 mg AM  10/14/22 appt noted:  with wife Casey [name] Psych Med: olanzapine 5 HS, Wellbutrin 300 mg AM Feels a little more like getting out but wife says not much.  She sees him being a little more agitated and irritable.  Still doesn't want to go out normally or be with other people.  Usual self is social.  Don't talk as much.  Doesn't want to be alone and doesn't want wife to be social. Sleeps a lot.  Partly DT back pain. Not nervous or anxious. Plan: Is having apathetic depressed again,  and failed Wellbutrin.   Resume pramipexole off label which worked before,  0.25 mg BID for 4 days then 0.375 mg BID  10/23/22 TC: still dep and wanting to increase pramipexole.  MD resp: incr to 0.5 mg BID  11/24/22 and neuro FU 01/08/23 noted:  follow-up for acute right frontal juxtacortical infarct most slightly due to  A-fib due to noncompliance with Xarelto.  Patient is here with his wife today.  He feels that he has returned to his baseline.   02/27/23 appt noted:  with Casey [name] Since stroke not back to church.  No energy and not wanting to do anything.  Sleep 12 hours and watches TV and recliner.  Wife says sleep excess no change since stroke.  Still dep.   Still wanting to isolate from others. No anxiety.   Past Psychiatric Medication Trials: Seroquel 300,  olanzapine 30 resolved mania,  (Improved  with Increase olanzapine to 30 mg PM and off the oxcarbazepine.) perphenazine, risperidone,  Latuda,  Abilify, Lithium did great helped but SE afib, kidneys and prostate,    alprazolam,  Depakote, Trileptal 1880 balance problems  carbamazepine, lamotrigine caused nausea,  citalopram, sertraline 150,  Wellbutrin 300 anxiety and irritability and failed 10/2022,  Pramipexole 0.375 BID helped Benztropine confusion        1 prior psych hosp depression                                                    Review of Systems:  Review of Systems  Constitutional:  Positive for fatigue.  HENT:         Clicking teeth  Eyes:  Negative for visual disturbance.  Gastrointestinal:  Positive for constipation.  Musculoskeletal:  Positive for arthralgias, back pain and gait problem.  Skin:  Negative for rash.       chronic  Neurological:  Positive for tremors and weakness.       Balance px longterm no worse RLS day and PM  Psychiatric/Behavioral:  Positive for dysphoric mood. Negative for agitation, behavioral problems, confusion, decreased concentration, hallucinations, self-injury, sleep disturbance and suicidal ideas. The patient is not nervous/anxious and is not hyperactive.     Medications: I have reviewed the patient's current medications.  Current Outpatient Medications  Medication Sig Dispense Refill   acetaminophen (TYLENOL) 500 MG tablet Take 1,000 mg by mouth every 6 (six) hours as needed for headache.      cholecalciferol (VITAMIN D) 1000 UNITS tablet Take 2,000 Units by mouth daily.     doxycycline (VIBRAMYCIN) 100 MG capsule Take 1 capsule (100 mg total) by mouth 2 (two) times daily. 20 capsule 0   levothyroxine (SYNTHROID, LEVOTHROID) 25 MCG tablet Take 25 mcg by mouth daily before breakfast.     metoprolol succinate (TOPROL-XL) 25 MG 24 hr tablet Take 1 tablet (25 mg total) by mouth daily. 90 tablet 3   OLANZapine (ZYPREXA) 5 MG tablet TAKE 1 TABLET BY MOUTH AT BEDTIME 30 tablet 2   pantoprazole (PROTONIX) 40 MG tablet TAKE ONE TABLET BY MOUTH ONCE DAILY (Patient taking differently: Take 40 mg by mouth daily.) 30 tablet 11   pramipexole (MIRAPEX) 0.25 MG tablet TAKE 1 & 1/2 (ONE & ONE-HALF) TABLETS BY MOUTH TWICE DAILY 90 tablet 1   predniSONE (DELTASONE) 20 MG tablet Take 2 tablets (40 mg total) by mouth daily with breakfast. 8 tablet 0   rivaroxaban (XARELTO) 20 MG TABS tablet Take 20 mg by mouth daily after supper.      rosuvastatin (CRESTOR) 20 MG tablet Take 1 tablet (20 mg total) by mouth daily. 30 tablet 0   SUPER B COMPLEX/C PO Take 1 tablet by mouth daily.     No current facility-administered medications for this visit.    Medication Side Effects: resolved  Allergies:  Allergies  Allergen Reactions   Amlodipine Other (See Comments)    Stopped due to kidney issues   Vicodin [Hydrocodone-Acetaminophen] Other (See Comments)    Patient is unsure of reaction    Past Medical History:  Diagnosis Date   Arthritis    Asthma    Atrial fibrillation (HCC) 01/12/2010   2D Echo EF=>55%   Bipolar disorder (HCC)    Chronic back pain    Depression    Dysrhythmia    AFib   GERD (gastroesophageal reflux disease)    History of colonic polyps    History of gout    Hyperlipidemia    Hypertension    Hypothyroidism    Morbid obesity (HCC)    OSA (obstructive sleep  apnea)    AHI was 27.62hr RDI was 34.3 hr REM 0.00hr; had CPAP years ago but no longer uses.   Prostatitis     Tubular adenoma of colon 01/2016    Family History  Problem Relation Age of Onset   Diabetes Mother    Heart failure Mother    Hypertension Mother    Hypertension Father    Cancer Paternal Grandmother    Stroke Sister    Liver disease Brother    Vascular Disease Brother    Arthritis Brother    Colon cancer Neg Hx     Social History   Socioeconomic History   Marital status: Married    Spouse name: Not on file   Number of children: Not on file   Years of education: Not on file   Highest education level: Not on file  Occupational History   Not on file  Tobacco Use   Smoking status: Never   Smokeless tobacco: Never  Substance and Sexual Activity   Alcohol use: No    Alcohol/week: 0.0 standard drinks of alcohol   Drug use: No   Sexual activity: Yes    Birth control/protection: None  Other Topics Concern   Not on file  Social History Narrative   Not on file   Social Determinants of Health   Financial Resource Strain: Not on file  Food Insecurity: No Food Insecurity (11/24/2022)   Hunger Vital Sign    Worried About Running Out of Food in the Last Year: Never true    Ran Out of Food in the Last Year: Never true  Transportation Needs: No Transportation Needs (11/24/2022)   PRAPARE - Administrator, Civil Service (Medical): No    Lack of Transportation (Non-Medical): No  Physical Activity: Not on file  Stress: Not on file  Social Connections: Not on file  Intimate Partner Violence: Patient Declined (11/24/2022)   Humiliation, Afraid, Rape, and Kick questionnaire    Fear of Current or Ex-Partner: Patient declined    Emotionally Abused: Patient declined    Physically Abused: Patient declined    Sexually Abused: Patient declined    Past Medical History, Surgical history, Social history, and Family history were reviewed and updated as appropriate.   Please see review of systems for further details on the patient's review from today.   Objective:    Physical Exam:  There were no vitals taken for this visit.  Physical Exam Constitutional:      General: He is not in acute distress. Musculoskeletal:        General: No deformity.  Neurological:     Mental Status: He is alert and oriented to person, place, and time.     Coordination: Coordination normal.     Gait: Gait normal.     Comments: Mild rocking motions legs resolved Mild lip licking and buccal movements  Psychiatric:        Attention and Perception: He is attentive. He does not perceive auditory hallucinations.        Mood and Affect: Mood is depressed. Mood is not anxious. Affect is blunt. Affect is not labile, angry or inappropriate.        Speech: Speech is not rapid and pressured or tangential.        Behavior: Behavior normal. Behavior is not agitated.        Thought Content: Thought content normal. Thought content is not delusional. Thought content does not include homicidal or suicidal ideation. Thought content does not  include suicidal plan.        Cognition and Memory: Cognition normal.        Judgment: Judgment normal.     Comments: Insight intact. No auditory or visual hallucinations. No delusions.  More depressed again Gait is a little slow but not marked. Lip smacking noticeable.     Lab Review:     Component Value Date/Time   NA 138 12/29/2022 0443   NA 141 12/28/2018 0941   K 3.8 12/29/2022 0443   CL 104 12/29/2022 0443   CO2 24 12/29/2022 0443   GLUCOSE 104 (H) 12/29/2022 0443   BUN 20 12/29/2022 0443   BUN 16 12/28/2018 0941   CREATININE 1.23 12/29/2022 0443   CALCIUM 8.4 (L) 12/29/2022 0443   PROT 7.0 11/24/2022 1252   PROT 6.6 12/28/2018 0941   ALBUMIN 3.9 11/24/2022 1252   ALBUMIN 4.5 12/28/2018 0941   AST 18 11/24/2022 1252   ALT 13 11/24/2022 1252   ALKPHOS 70 11/24/2022 1252   BILITOT 0.8 11/24/2022 1252   BILITOT 0.7 12/28/2018 0941   GFRNONAA >60 12/29/2022 0443   GFRAA 63 12/28/2018 0941       Component Value Date/Time    WBC 5.7 12/29/2022 0443   RBC 4.23 12/29/2022 0443   HGB 13.5 12/29/2022 0443   HGB 15.2 12/28/2018 0941   HCT 40.1 12/29/2022 0443   HCT 42.9 12/28/2018 0941   PLT 120 (L) 12/29/2022 0443   PLT 186 12/28/2018 0941   MCV 94.8 12/29/2022 0443   MCV 89 12/28/2018 0941   MCH 31.9 12/29/2022 0443   MCHC 33.7 12/29/2022 0443   RDW 13.2 12/29/2022 0443   RDW 12.9 12/28/2018 0941   LYMPHSABS 0.7 12/29/2022 0443   LYMPHSABS 1.1 12/28/2018 0941   MONOABS 0.5 12/29/2022 0443   EOSABS 0.2 12/29/2022 0443   EOSABS 0.1 12/28/2018 0941   BASOSABS 0.0 12/29/2022 0443   BASOSABS 0.0 12/28/2018 0941    No results found for: "POCLITH", "LITHIUM"   No results found for: "PHENYTOIN", "PHENOBARB", "VALPROATE", "CBMZ"   .res Assessment: Plan:    Jerry was seen today for follow-up and depression.  Diagnoses and all orders for this visit:  Bipolar affective disorder, currently depressed, mild (HCC)  Generalized anxiety disorder  Secondary restless legs syndrome  Insomnia due to mental condition  History of obstructive sleep apnea    History pseudodementia with depression which is resolved.  But depressed again.   Mild lip smacking consistent with TD.  Will follow   30 min face to face time with patient was spent on counseling and coordination of care. We discussed Dx bipolar at 77 yo.  Bipolar depression has recurred again like last year but so far is not as severe.  Patient is very complicated because of weakness and medical problems.     Hx seasonal changes so cautioned about mania in the spring.  Prednisone triggered some mania in 2023 and persisted off prednisone.    No RLS  and sleeping too much still  RLS better with less olanzapine.  Stop caffeine.  No longer manic. Disc dosing range.  Continue olanzapine 5 mg daily. Mild TD   If still a problem with sleepiness DT multiple med failures consider modafinil to manage.  Is having apathetic depressed again,  and failed  Wellbutrin.   Resumeed pramipexole off label which worked before, but he was not on olanzapine at the time.  This may be interfering but needs mood stabilizer.  Wean pramipexole or increase  it.  Wean and try modafinil off label. Consider switch to [employer] but their concerned about costs. This is limiting options.  Stop olanzapine Start Vraylar 1 in the AM Reduce pramipexole to 1 twice daily for 4 days then 1/2 twice daily for 4 days then stop it. This plan will reduce meds and answer question about sleepiness from olanzapine and reduce polypharmacy  Stop caffeine  Fall precautions.  Follow-up 6 weeks  Meredith Staggers, MD, DFAPA   Please see After Visit Summary for patient specific instructions.  Future Appointments  Date Time Provider Department Center  04/15/2023 10:00 AM Croitoru, Rachelle Hora, MD [facility] None      No orders of the defined types were placed in this encounter.      -------------------------------

## 2023-03-18 ENCOUNTER — Telehealth: Payer: Self-pay | Admitting: Psychiatry

## 2023-03-18 NOTE — Telephone Encounter (Signed)
[facility] called and LM stating that the vraylar is no making any difference.  Please call to discuss. Next appt 9/26

## 2023-03-19 NOTE — Telephone Encounter (Signed)
LVM to [facility] 

## 2023-03-19 NOTE — Telephone Encounter (Signed)
Patient seen on 8/15 and given samples of Vraylar 1.5 mg.   Stop olanzapine Start Vraylar 1 in the AM Reduce pramipexole to 1 twice daily for 4 days then 1/2 twice daily for 4 days then stop it.  Wife reporting no improvement in depression and he is still sleeping all the time. She is also reporting it is too expensive and they would not be able to afford it, but PA has not been done. Requesting to go back to previous medications.

## 2023-03-19 NOTE — Telephone Encounter (Signed)
It has not been long enough and I need to see him to find an alternative to [employer].  Stay on the Vraylar 1.5 mg daily until his appt later this month.  I don't want to resume olanzapine which didn't work until we explore other options.

## 2023-03-20 NOTE — Telephone Encounter (Signed)
Wife notified. Pulled samples to get to appt.

## 2023-04-10 ENCOUNTER — Encounter: Payer: Self-pay | Admitting: Psychiatry

## 2023-04-10 ENCOUNTER — Ambulatory Visit: Payer: Medicare Other | Admitting: Psychiatry

## 2023-04-10 DIAGNOSIS — Z79899 Other long term (current) drug therapy: Secondary | ICD-10-CM

## 2023-04-10 DIAGNOSIS — F3132 Bipolar disorder, current episode depressed, moderate: Secondary | ICD-10-CM

## 2023-04-10 DIAGNOSIS — G4710 Hypersomnia, unspecified: Secondary | ICD-10-CM | POA: Diagnosis not present

## 2023-04-10 DIAGNOSIS — Z8669 Personal history of other diseases of the nervous system and sense organs: Secondary | ICD-10-CM | POA: Diagnosis not present

## 2023-04-10 DIAGNOSIS — F411 Generalized anxiety disorder: Secondary | ICD-10-CM

## 2023-04-10 DIAGNOSIS — G4733 Obstructive sleep apnea (adult) (pediatric): Secondary | ICD-10-CM

## 2023-04-10 MED ORDER — MODAFINIL 100 MG PO TABS
ORAL_TABLET | ORAL | 0 refills | Status: DC
Start: 2023-04-10 — End: 2023-09-25

## 2023-04-10 MED ORDER — LITHIUM CARBONATE 150 MG PO CAPS
450.0000 mg | ORAL_CAPSULE | Freq: Every evening | ORAL | 0 refills | Status: DC
Start: 2023-04-10 — End: 2023-05-27

## 2023-04-10 NOTE — Progress Notes (Signed)
Casey [name] [id number] July 29, 1945 77 y.o.    Subjective:   Patient ID:  Casey [name] is a 77 y.o. (DOB 21-Nov-1945) male.  Chief Complaint:  Chief Complaint  Patient presents with   Follow-up   Depression   Fatigue    HPI  Casey [name] presents for follow-up of bipolar disorder and anxiety and history of confusion  At visit  Dec 02, 2018.  He was having confusion and benztropine was stopped to see if that was the cause.  Also it was having balance issues and therefore Trileptal was switched all to nighttime 1200 mg nightly. Confusion much better per both of them with DC benztropine.  Balance better with change Trileptal.   visit was January 13, 2019 and he was doing well as noted above and he was encouraged not to make further med changes at that time.  visit April 13, 2019.  He was doing better with regard to depression than he had in quite some time on the Wellbutrin and no meds were changed.  He was having health problems with pulmonary effusion and fluid removed twice related to fall in January 2020.  seen September 13, 2019.  For residual depression the following decisions were made: Balance problem much improved with dosing Trileptal all in the evening. Depression under partial control & wife wants it to be better per his wife.  No mood swings.   Increase bupropion XL to 2 of the 150 mg tablets in the morning until the current bottle is gone. Then new RX will be 1 of the 300 mg tablets each morning.  November 08, 2019 appointment the following is noted:  Seen with wife, [name]. Small amount of improvement with change.  Anxiety got worse.  Notices when he goes out.  Worries about social and political things.  Wife keeps him active at home.  Allerigies bother him..  No sig caffeine except tea when goes out.  Wife sees him as a little more active and initiative.  More productive.  Started some cooking.  Getting out and up better.  A little more talkative.   "He wants to be hyper  but we know what happens". Wife [name] thinks   But for the last 2 years he's done nothing and laid around all the time.  He's a lot more active now. Big improvement. Went to church first time in 2 years. Plan:  For TRD, Pramipexole 0.25 mg tablet 1/2 twice daily for 1 week then 1 twice daily.  12/20/2019 appointment with the following noted:  Seen with wife. A little better motivated to get out but not to get in crowds.  Wife agrees more motivated and went to church once alone without pressure.  Wife says he's more talkative and much  Better interest.  Still not motivated to do things like mow the grass.  Others's have seen a difference. Concentration and memory are improved but not normal. No Se pramipexole.  Getting out of bed earlier and better now.. Wife admisters meds bc earlier he messsed them up. Plan: Wife has seen a big improvement therefore Partial improvement is clear from this so increase Pramipexole 0.375 mg tablet  twice daily  Anxiety is not fully managed.  Partly related to depression. Tremor is manageable.    02/15/20 appt with the following noted:  Send with wife [name] Doing good.  Increased pramipexole as indicated.  Tolerating meds Wellbutrin XL 300 mg AM & Trileptal 1200 mg HS with it. Much better with depression.  Stays up more and speinding less time in bed.   Wife agrees he's much better and handling things better. 2 1/2 yo 4th cousin girl drowned in pond lately.  Big family. Wife pleased he handled it better. SE constipation ? Related. History Linzess but diarrhea. He's not highly motivated but it is better.  Depression is much improved.  Had been depressed for years. Plan no med changes  06/13/2020 appointment with the following noted: Pretty good but cardiologist noted today increased pulse needing treatment.  Uncontrolled afib even after ablation. Hard to tell about depression bc of fatigue Dt heart problems. Wife said he did well Thanksgiving and participated more  in conversation per wife. Plan: no med changes  09/06/2020 appointment with following noted: Remains on Wellbutrin XL 300 mg every morning, ginkgo biloba 40 mg twice daily, oxcarbazepine 1200 mg nightly, pramipexole 0.75 mg twice daily. Less sig health concerns. He thinks mood has been pretty good.  Consistent with meds. Wife thinks he stays tired and sleepy and wonders if can give him a stimulant.   Sleep good.  Will nap.  Doesn't bother him.  Walks dog 20 min in AM.  Not much initiative per wife.  Not depressed but when depressed he stayed in the bed lasting a couple of years.  History of OSA but lost weight and doesn't think he has apnea anymore.  No CPAP. Chronic afib. Not much caffeine. Plan: Wife& patient has seen a big improvement with Pramipexole 0.375 mg tablet  twice daily They ask consideration be given to the following: If his energy gets better by treating his tachycardia they would like to consider reducing the pramipexole or possibly eliminating it at follow-up.   For energy trial reduction pramipexole to 0.25 mg BID.  If gets more depressed call. Alternative of modafinil 100  12/04/2020 appointment with the following noted: Energy is better with awakening earlier at 8 and doing more and getting out more.  Going to bed later with 8 hour  Sleep. Mood fluctuates with more reacting to wife mainly.  More outspoken.  Per W he has to vent.  She thinks he is too focused on politics and issues.   Plan: Depression under control.  No mood swings.   Continue bupropion XL 300 mg tablets each morning. Continue Trileptal 600 mg 2 at HS. No higher DT anxiety risk and no traditional stimulants DT afib.  Wife & patient has seen a big improvement with Pramipexole and able to reduce to  0.25 mg tablet  twice daily  02/20/2021 appointment with the following noted:  seen with wife Prednisone made him real hyper.  Been off of it for at least a couple weeks.  Hyperverbal and hyperactive.  Hip pain  interferes with sleep.  Sees doctor tomorrow. Average 3-4 hours at night and no naps bc busy.  No excessive spending but more than usual.  Denies appetite disturbance.  Patient reports that energy and motivation have been reduced.  Patient has difficulty with concentration and memory.  Patient denies any suicidal ideation.  W had MI in June. A/P: mild mania Wean Wellbutrin over 2 weeks. Continue Trileptal 600 mg 2 at HS. Wife & patient has seen a big improvement with Pramipexole and able to reduce to  0.25 mg tablet  twice daily. Continue it unless mania does not resolve with less bupropion.  03/26/2021 phone call: [name] stated Casey [name] has slightly improved since last week.He has not been able to sleep and was up at 2 am texting  people in a disoriented state.His pcp put him on Ambien 10 mg and this has helped him sleep.However,linda stated he is still working him self too much.She said he is jittery and has to have something to do at all times.He does not want to slow down or get rest.She thinks he needs to cutback on the Mirapex as well. MD response:  I agree with his wife about reducing Mirapex from 1 tablet twice a day to half tablet twice a day for 1 week and if his symptoms are not resolved and stop the Mirapex.  04/18/2021 phone call with wife and MD: Patient is still manic after stopping Wellbutrin and pramipexole.  Wife's concern is hard to get him to settle down at night.  Reviewed with wife prior psychiatric medications which are multiple.  Typically one would use an antipsychotic in this case but she does not think that they worked in the past.  She says he has been on higher doses of Trileptal and tolerated it. Increase Trileptal to 600 mg every morning and 1200 mg nightly for mania.  Keep scheduled appointment.  05/02/2021 wife reports patient still manic: We cannot go higher in the Trileptal because of balance issues.  Therefore olanzapine 10 mg nightly was started and we discussed the option  of hospitalization.  We will try to get the patient in urgently to an appointment.  05/07/2021 appointment with the following noted: seen with wife, [name] Not doing too well.  Trouble getting out of bed.  Wife says he's still up in middle of the night but he says he's sleeping better.  Sleep in recliner downstairs. She says he's still manic with hyperactive, hyper graphia but won't show her what he's writing. On olanzapine  No changes off Wellbutrin and pramipexole. Apparently more balance problems with increase Trileptal to 1800 mg daily.  Dropped back to 1200 mg HS. Admits to some confusion.  Has pulled off labels on bottle per wife. She thinks he's averaging 4-6 hours of sleep and a little better than it was. Wife said he started cussing and he has not done that in the past. Other episodes of inappropriate decision making.  He recognizes he's manic but can't control himself.  Hyperverbal, pressured.  In past had speech problems with mania also.  Neg neuro work up for stroke in the past. SE balance and tremor. Some trouble getting out of chair? Plan: For mania,Increase olanzapine to 15 mg PM Follow-up Friday for urgent visit  05/11/2021 appointment with the following noted: Calming down a little. He thinks he's sleeping better.  She says he needs whole Ambien with olanzapine and last night still not sleeping.  He woke her with complaints of back pain. She's not seeing SE and nor is he.  She's not seeing much change  He says back pain is complicating his sleep and his problem. He tries to stay active.   She says he talks incessantly but not as much to her. She says major life events have triggered mania in him in the past. She notices him making poor decisions. Plan: Increase olanzapine to 20 mg PM  05/24/2021 appointment with the following noted: with D Gunnar Fusi A week ago went of to doctor's office alone and wife called bc he was agitated. Per wife still going wide open but is sleeping  better. Yesterday had fever and talking out of his head and dx UTI. He still feels driven to do chores but distractible and inefficient.   D does not see  improvement and nor does wife.   7 hour sleep lately. Plan: Increase olanzapine to 30 mg in evening Reduce oxcarbazepine to 1 and 1/2 tablets in the evening.  06/04/2021 appointment: Seen with wife Sleeping more 7-9 hours.   Per wife still some confusion and still short tempered.  Less pressured sleep. He won't go to bed when sleepy and waits too long to get in bed and is weak and cannot do it by himself. Wife agrees he's less "wide open" than before and will be able to sit and watch TV show now. B died yesterday. Plan: Continue olanzapine to 30 mg in evening Reduce oxcarbazepine  again to 1 tablets in the evening for 1 week, then 1/2 tablet at night for a week then stop it.  07/04/21 appt noted:  seen with wife [name] Hyper-ness is better.  Some trouble with concentration but able to make peanut butter fudge. Problems with afib.   Sleeping a lot and some instability.  Goes to sleep in chair.  Better with irritability.  No longer talking constantly but still some in spells.   Not depressed.   No change in balance off oxcarbazepine. Wife said last week mixed up meds and got oversedated.  She discovered the option. Plan: Reduce olanzapine to 20 mg PM now that mania is largely resolved.  08/06/2021 appointment with the following noted: Only psych med is reduced olanzapine 20 mg HS RLS and fidgety.  All day.  Can sit for an hour.  Uncomfortable when sitting. Stopped Ginko and wife noticed RLS more.  She restarted it.  No longer rocking back and forth. Caffeine tea at lunch.  No coffee..  [place] some. RLS recently started. Bipolar sx great per wife.  Not wanting to go out much. CO tiredness.  Falls asleep in recliner.  Sleep all night. Eats sweets all his life. Plan: No longer manic. Reduce olanzapine to 15 mg earlier in PM Stop  caffeine  09/06/2021 appointment with the following noted: Reduced olanzapine 15 mg and still RLS all the day. No change in that.  But can sit in church ok.  Will move legs when standing and talking to people. Sleeping more than he should from 10 until 9. And still naps and little initiative bc tired. Not depressed but not back to normal.  Not as social as usual. Loves to eat out. Plan: Reduce olanzapine from 15 to 7.5 mg earlier in PM Stop caffeine  10/02/2021 appointment with the following noted: seen again with wife [name] Only psych med is the reduced dose of olanzapine 7.5 mg for 30 days. Back pain limits activity and getting shots. Taking tramadol for pain.  1-2 prn.  One helps. 2 is sedating. RLS is better and mainly now the problem is back pain.  Has to sleep in recliner DT pain.   [name] says hard to tell about effect from reduced olanzapine bc he's having such back pain and taking tramadol.  Doesn't feel like doing anything. Plan continue olanzapine 7.5 mg every afternoon  12/04/2021 appointment with the following noted: seen with wife Still doing well with olanzapine 7.5 mg PM No SE.  RLS is OK now. Sleep good 8-10 hours. Not depressed nor manic sx.   Happy with meds.   Wife gall bladder surgery. H helping wife. Plan: olanzapine 7.5 mg earlier in PM Stopcaffeine   03/19/2022 phone call: Wife called complaining that her husband sleeping 14 to 15 hours a day on olanzapine 7.5 mg daily and wondering about trying  to reduce it. MD response: Olanzapine is his only mood stabilizer at present.  There is a risk that he could get manic again with the dosage reduction.  But we can try reducing from 7.5 to 5 mg at night.  That may or may not help with his tendency to sleep excessively.  Some people will do that even on low doses of olanzapine.  Please send in prescription for olanzapine 5 mg tablets 1 nightly #30, 1 additional refill and make a note in the pharmacy section of the order to  cancel any other olanzapine refills.     06/04/2022 appointment noted:  wife says a tad better with less olanzapine but still wants to sleep and sit around a lot. Keeps saying I'm not depressed.   Reduced olanzapine 5 mg HS. No mania or depression. Hx sleep study and dx OSA but per wife got better with wt loss.  Not using CPAP. Eating less but wt loss stable. Asks about seasonal pattern bc of the history.  She can't remember the seasonal pattern.   Plan: Reduce  DT sleepiness olanzapine 2.5 mg earlier in PM Mild TD   07/29/22 TC: no changes with reduction so increased back to 5 mg olanzapine.  08/14/22 appt noted: Still sleeping too much 12 hours.   Also been irritable with flashes.   Something can set him off. Probably getting depressed some with less olanzapine.  Some anxiety.   Some reduction in interest and not going to church like before and doesn't like to be around people.  Doesn't want wife to help others either.   No restlessness.  Will nap in the afternoon. Not enjoying things and not laughing. Plan: Is getting more depressed again, resume Wellbutrin XL up to 300 mg AM  10/14/22 appt noted:  with wife [name] Psych Med: olanzapine 5 HS, Wellbutrin 300 mg AM Feels a little more like getting out but wife says not much.  She sees him being a little more agitated and irritable.  Still doesn't want to go out normally or be with other people.  Usual self is social.  Don't talk as much.  Doesn't want to be alone and doesn't want wife to be social. Sleeps a lot.  Partly DT back pain. Not nervous or anxious. Plan: Is having apathetic depressed again,  and failed Wellbutrin.   Resume pramipexole off label which worked before,  0.25 mg BID for 4 days then 0.375 mg BID  10/23/22 TC: still dep and wanting to increase pramipexole.  MD resp: incr to 0.5 mg BID  11/24/22 and neuro FU 01/08/23 noted:  follow-up for acute right frontal juxtacortical infarct most slightly due to A-fib due to  noncompliance with Xarelto.  Patient is here with his wife today.  He feels that he has returned to his baseline.   02/27/23 appt noted:  with [name] Since stroke not back to church.  No energy and not wanting to do anything.  Sleep 12 hours and watches TV and recliner.  Wife says sleep excess no change since stroke.  Still dep.   Still wanting to isolate from others. No anxiety. Plan: Stop olanzapine Start Vraylar 1 in the AM Reduce pramipexole to 1 twice daily for 4 days then 1/2 twice daily for 4 days then stop it. This plan will reduce meds and answer question about sleepiness from olanzapine and reduce polypharmacy  03/18/23 TC" Wife reporting no improvement in depression and he is still sleeping all the time. She is also  reporting it is too expensive and they would not be able to afford it, but PA has not been done. Requesting to go back to previous medications.  CC resp: Me  to Franchot Erichsen, CMA  CC   03/19/23  6:39 PM Note It has not been long enough and I need to see him to find an alternative to Vraylar.  Stay on the Vraylar 1.5 mg daily until his appt later this month.  I don't want to resume olanzapine which didn't work until we explore other options.     04/10/23 appt noted: seen with wife Hildred Alamin meds: Vraylar 1.5 mg daily.  No olanzapine, pramipexole stopped. Sleeping about the same but easier to get up per wife on Vraylar.  No change in mood.  Still feels dep.  Not wanting to go out and be around people.  Without change.  No social life.  More HA.   Still dealing with chronic TR afib and tx ongoing. Easily out of breath.   Will walk dog 10 mins.   Tires him.  Sleeps a lot during the day. Hx OSA but since lost wt not needing CPAP Doesn't laugh much. Not going to church DT dep. Worry a lot without change.  Will resolve when not dep.    Past Psychiatric Medication Trials: Seroquel 300,  olanzapine 30 resolved mania,  (Improved  with Increase olanzapine to 30 mg PM and  off the oxcarbazepine.) perphenazine, risperidone,  Latuda,   Abilify, Vraylar 1.5 mg for 1 month without benefit Lithium did great helped but SE afib, kidneys and prostate,    alprazolam,  Depakote, Trileptal 1800 balance problems  carbamazepine, lamotrigine caused nausea,  citalopram, sertraline 150,    Wellbutrin 300 anxiety and irritability and failed 10/2022,  Pramipexole 0.375 BID helped Benztropine confusion        1 prior psych hosp depression                                                    Review of Systems:  Review of Systems  Constitutional:  Positive for fatigue.  HENT:         Clicking teeth  Eyes:  Negative for visual disturbance.  Gastrointestinal:  Positive for constipation.  Musculoskeletal:  Positive for arthralgias, back pain and gait problem.  Skin:  Negative for rash.       chronic  Neurological:  Positive for tremors and weakness.       Balance px longterm no worse RLS day and PM  Psychiatric/Behavioral:  Positive for dysphoric mood. Negative for agitation, behavioral problems, confusion, decreased concentration, hallucinations, self-injury, sleep disturbance and suicidal ideas. The patient is not nervous/anxious and is not hyperactive.     Medications: I have reviewed the patient's current medications.  Current Outpatient Medications  Medication Sig Dispense Refill   acetaminophen (TYLENOL) 500 MG tablet Take 1,000 mg by mouth every 6 (six) hours as needed for headache.     cariprazine (VRAYLAR) 1.5 MG capsule Take 1.5 mg by mouth daily. Started 02/28/23     cholecalciferol (VITAMIN D) 1000 UNITS tablet Take 2,000 Units by mouth daily.     levothyroxine (SYNTHROID, LEVOTHROID) 25 MCG tablet Take 25 mcg by mouth daily before breakfast.     metoprolol succinate (TOPROL-XL) 25 MG 24 hr tablet Take 1 tablet (25 mg total) by  mouth daily. 90 tablet 3   pantoprazole (PROTONIX) 40 MG tablet TAKE ONE TABLET BY MOUTH ONCE DAILY (Patient taking differently: Take  40 mg by mouth daily.) 30 tablet 11   rivaroxaban (XARELTO) 20 MG TABS tablet Take 20 mg by mouth daily after supper.      rosuvastatin (CRESTOR) 20 MG tablet Take 1 tablet (20 mg total) by mouth daily. 30 tablet 0   SUPER B COMPLEX/C PO Take 1 tablet by mouth daily.     OLANZapine (ZYPREXA) 5 MG tablet TAKE 1 TABLET BY MOUTH AT BEDTIME (Patient not taking: Reported on 04/10/2023) 30 tablet 2   pramipexole (MIRAPEX) 0.25 MG tablet TAKE 1 & 1/2 (ONE & ONE-HALF) TABLETS BY MOUTH TWICE DAILY (Patient not taking: Reported on 04/10/2023) 90 tablet 1   No current facility-administered medications for this visit.    Medication Side Effects: resolved  Allergies:  Allergies  Allergen Reactions   Amlodipine Other (See Comments)    Stopped due to kidney issues   Vicodin [Hydrocodone-Acetaminophen] Other (See Comments)    Patient is unsure of reaction    Past Medical History:  Diagnosis Date   Arthritis    Asthma    Atrial fibrillation (HCC) 01/12/2010   2D Echo EF=>55%   Bipolar disorder (HCC)    Chronic back pain    Depression    Dysrhythmia    AFib   GERD (gastroesophageal reflux disease)    History of colonic polyps    History of gout    Hyperlipidemia    Hypertension    Hypothyroidism    Morbid obesity (HCC)    OSA (obstructive sleep apnea)    AHI was 27.62hr RDI was 34.3 hr REM 0.00hr; had CPAP years ago but no longer uses.   Prostatitis    Tubular adenoma of colon 01/2016    Family History  Problem Relation Age of Onset   Diabetes Mother    Heart failure Mother    Hypertension Mother    Hypertension Father    Cancer Paternal Grandmother    Stroke Sister    Liver disease Brother    Vascular Disease Brother    Arthritis Brother    Colon cancer Neg Hx     Social History   Socioeconomic History   Marital status: Married    Spouse name: Not on file   Number of children: Not on file   Years of education: Not on file   Highest education level: Not on file   Occupational History   Not on file  Tobacco Use   Smoking status: Never   Smokeless tobacco: Never  Substance and Sexual Activity   Alcohol use: No    Alcohol/week: 0.0 standard drinks of alcohol   Drug use: No   Sexual activity: Yes    Birth control/protection: None  Other Topics Concern   Not on file  Social History Narrative   Not on file   Social Determinants of Health   Financial Resource Strain: Not on file  Food Insecurity: No Food Insecurity (11/24/2022)   Hunger Vital Sign    Worried About Running Out of Food in the Last Year: Never true    Ran Out of Food in the Last Year: Never true  Transportation Needs: No Transportation Needs (11/24/2022)   PRAPARE - Administrator, Civil Service (Medical): No    Lack of Transportation (Non-Medical): No  Physical Activity: Not on file  Stress: Not on file  Social Connections: Not on file  Intimate Partner Violence: Patient Declined (11/24/2022)   Humiliation, Afraid, Rape, and Kick questionnaire    Fear of Current or Ex-Partner: Patient declined    Emotionally Abused: Patient declined    Physically Abused: Patient declined    Sexually Abused: Patient declined    Past Medical History, Surgical history, Social history, and Family history were reviewed and updated as appropriate.   Please see review of systems for further details on the patient's review from today.   Objective:   Physical Exam:  There were no vitals taken for this visit.  Physical Exam Constitutional:      General: He is not in acute distress. Musculoskeletal:        General: No deformity.  Neurological:     Mental Status: He is alert and oriented to person, place, and time.     Coordination: Coordination normal.     Gait: Gait normal.     Comments: Mild rocking motions legs resolved Mild lip licking and buccal movements  Psychiatric:        Attention and Perception: He is attentive. He does not perceive auditory hallucinations.         Mood and Affect: Mood is depressed. Mood is not anxious. Affect is blunt. Affect is not labile, angry or inappropriate.        Speech: Speech is not rapid and pressured or tangential.        Behavior: Behavior normal. Behavior is not agitated.        Thought Content: Thought content normal. Thought content is not delusional. Thought content does not include homicidal or suicidal ideation. Thought content does not include suicidal plan.        Cognition and Memory: Cognition normal.        Judgment: Judgment normal.     Comments: Insight intact. No auditory or visual hallucinations. No delusions.  More depressed again Gait is a little slow but not marked. Lip smacking largely resolved.     Lab Review:     Component Value Date/Time   NA 138 12/29/2022 0443   NA 141 12/28/2018 0941   K 3.8 12/29/2022 0443   CL 104 12/29/2022 0443   CO2 24 12/29/2022 0443   GLUCOSE 104 (H) 12/29/2022 0443   BUN 20 12/29/2022 0443   BUN 16 12/28/2018 0941   CREATININE 1.23 12/29/2022 0443   CALCIUM 8.4 (L) 12/29/2022 0443   PROT 7.0 11/24/2022 1252   PROT 6.6 12/28/2018 0941   ALBUMIN 3.9 11/24/2022 1252   ALBUMIN 4.5 12/28/2018 0941   AST 18 11/24/2022 1252   ALT 13 11/24/2022 1252   ALKPHOS 70 11/24/2022 1252   BILITOT 0.8 11/24/2022 1252   BILITOT 0.7 12/28/2018 0941   GFRNONAA >60 12/29/2022 0443   GFRAA 63 12/28/2018 0941       Component Value Date/Time   WBC 5.7 12/29/2022 0443   RBC 4.23 12/29/2022 0443   HGB 13.5 12/29/2022 0443   HGB 15.2 12/28/2018 0941   HCT 40.1 12/29/2022 0443   HCT 42.9 12/28/2018 0941   PLT 120 (L) 12/29/2022 0443   PLT 186 12/28/2018 0941   MCV 94.8 12/29/2022 0443   MCV 89 12/28/2018 0941   MCH 31.9 12/29/2022 0443   MCHC 33.7 12/29/2022 0443   RDW 13.2 12/29/2022 0443   RDW 12.9 12/28/2018 0941   LYMPHSABS 0.7 12/29/2022 0443   LYMPHSABS 1.1 12/28/2018 0941   MONOABS 0.5 12/29/2022 0443   EOSABS 0.2 12/29/2022 0443   EOSABS 0.1 12/28/2018 0941  BASOSABS 0.0 12/29/2022 0443   BASOSABS 0.0 12/28/2018 0941    No results found for: "POCLITH", "LITHIUM"   No results found for: "PHENYTOIN", "PHENOBARB", "VALPROATE", "CBMZ"   .res Assessment: Plan:    Casey [name] was seen today for follow-up, depression and fatigue.  Diagnoses and all orders for this visit:  Bipolar affective disorder, currently depressed, moderate (HCC)  Generalized anxiety disorder  History of obstructive sleep apnea  Hypersomnolence disorder, acute, moderate  Obstructive sleep apnea syndrome    30 min face to face time with patient was spent on counseling and coordination of care. We discussed Dx bipolar at 77 yo.  Bipolar depression has recurred again like last year but so far is not as severe.  Patient is very complicated because of weakness and medical problems.    History pseudodementia with depression.   But depressed again. Hypersomnolence.   Prednisone triggered some mania in 2023 and persisted off prednisone.    Very Mild lip smacking consistent with TD but appears better.  No change in energy, dep, hypersomnolence with switch from olanzapine to Vraylar.  No RLS  and sleeping too much still RLS better with less olanzapine.   If still a problem with sleepiness DT multiple med failures consider modafinil to manage.  Stop Vraylar bc no benefit for a month and unaffordable .   Cr is normal.  Failed multiple alternatives.  Disc risks lithium but it is what worked best.  Will try LED.   Resume lithium 450 HS.  Check level and BMP in a week. Option modafinil if not better in a couple of weeks. For OSA and hypersomnolence and off label bipolar dep Sees Card Oct 1.  Disc cardiac risks lithium but only med that has worked consistently.  Follow-up 6 weeks  Meredith Staggers, MD, DFAPA   Please see After Visit Summary for patient specific instructions. Stop Vraylar Start 3 of the 150 mg capsules of lithium at night Get a blood level in the morning 5-7  days later Use modafinil for alertness if needed  Future Appointments  Date Time Provider Department Center  04/15/2023 10:00 AM Croitoru, Rachelle Hora, MD [facility] None  08/20/2023  8:40 AM Croitoru, Rachelle Hora, MD [facility] None      No orders of the defined types were placed in this encounter.      -------------------------------

## 2023-04-10 NOTE — Patient Instructions (Signed)
Stop Vraylar Start 3 of the 150 mg capsules of lithium at night Get a blood level in the morning 5-7 days later Use modafinil for alertness if needed

## 2023-04-14 NOTE — Progress Notes (Unsigned)
Cardiology Office Note   Date:  04/14/2023   ID:  Casey [name], DOB 09/30/1945, MRN [id number]  PCP:  Casey Found, MD  Cardiologist:  Brocha Gilliam Electrophysiologist:  None   Evaluation Performed:  Follow-Up Visit  Chief Complaint:  AFib  History of Present Illness:    Casey [name] is a 77 y.o. male with history of stroke (left sided weakness and facial droop, right frontal ischemic infarct 11/24/2022 in the setting of noncompliance with anticoagulants), permanent atrial fibrillation, but without other major structural cardiac abnormalities.  He has obstructive sleep apnea and longstanding struggles with bipolar disorder.  He was seen in the emergency room 12/29/2022 with bloody sputum.  CT angiography showed no evidence of pneumonia or pulmonary embolism, bleeding was likely due to acute bronchitis.  He has no cardiovascular complaints.  He has not had falls or bleeding problems.  Denies exertional dyspnea or chest pain and is not aware of any palpitations.  Has not had dizziness or syncope.  Denies focal neurological events, edema, orthopnea, PND, claudication.  Going through a little bit of depression right now, but not severe.  Reports labs including lipid profile and thyroid function testing performed by Dr. Phillips Odor in December with all labs in good range.  He feels tired all the time.  Although he was diagnosed with sleep apnea, he has not used CPAP in years.  After losing weight, he felt that this was cured.  He was prescribed CPAP twice before and on each occasion only was able to use it for couple of months before abandoning therapy. He has not been evaluated for a dental device.  He is missing a few teeth.  A couple of years ago he had a pleural effusion following trauma.    Past Medical History:  Diagnosis Date   Arthritis    Asthma    Atrial fibrillation (HCC) 01/12/2010   2D Echo EF=>55%   Bipolar disorder (HCC)    Chronic back pain    Depression    Dysrhythmia     AFib   GERD (gastroesophageal reflux disease)    History of colonic polyps    History of gout    Hyperlipidemia    Hypertension    Hypothyroidism    Morbid obesity (HCC)    OSA (obstructive sleep apnea)    AHI was 27.62hr RDI was 34.3 hr REM 0.00hr; had CPAP years ago but no longer uses.   Prostatitis    Tubular adenoma of colon 01/2016   Past Surgical History:  Procedure Laterality Date   APPENDECTOMY  1959   asthma     CATARACT EXTRACTION Bilateral 1995   COLONOSCOPY  2011   RMR: left-sided diverticula, minimal rectal friability. No polyps. Repeat 5 years.   COLONOSCOPY WITH PROPOFOL N/A 01/15/2016   Dr.Rourk- diverticulosis in the entire examined colon, multiple rectal and colonic polyps, internal hemorrhoids. bx= tubular adenomas and hyperplastic polyps.    COLONOSCOPY WITH PROPOFOL N/A 03/13/2022   Procedure: COLONOSCOPY WITH PROPOFOL;  Surgeon: Corbin Ade, MD;  [facility]: [facility];  Service: Endoscopy;  Laterality: N/A;  11:00am   HAND / FINGER LESION EXCISION Right    IR THORACENTESIS ASP PLEURAL SPACE W/IMG GUIDE  03/01/2019   IR THORACENTESIS ASP PLEURAL SPACE W/IMG GUIDE  03/31/2019   POLYPECTOMY  01/15/2016   Procedure: POLYPECTOMY;  Surgeon: Corbin Ade, MD;  Location: [facility];  Service: Endoscopy;;   POLYPECTOMY  03/13/2022   Procedure: POLYPECTOMY;  Surgeon: Corbin Ade,  MD;  Location: [facility];  Service: Endoscopy;;   right shoulder surgery     TONSILLECTOMY  1955     No outpatient medications have been marked as taking for the 04/15/23 encounter (Appointment) with Casey [name], Casey Hora, MD.     Allergies:   Amlodipine and Vicodin [hydrocodone-acetaminophen]   Social History   Tobacco Use   Smoking status: Never   Smokeless tobacco: Never  Substance Use Topics   Alcohol use: No    Alcohol/week: 0.0 standard drinks of alcohol   Drug use: No     Family Hx: The patient's family history includes Arthritis in his brother; Cancer in his  paternal grandmother; Diabetes in his mother; Heart failure in his mother; Hypertension in his father and mother; Liver disease in his brother; Stroke in his sister; Vascular Disease in his brother. There is no history of Colon cancer.  ROS:   Please see the history of present illness.     All other systems reviewed and are negative.  Studies Reviewed: .        *** Risk Assessment/Calculations:   {Does this patient have ATRIAL FIBRILLATION?:[phone]} No BP recorded.  {Refresh Note OR Click here to enter BP  :1}***        EKG:  Ordered today, shows atrial fibrillation with controlled ventricular rate, otherwise normal tracing.  QTc 440 ms.  Recent Labs: 11/24/2022: ALT 13 11/25/2022: TSH 2.288 11/26/2022: Magnesium 1.8 12/29/2022: BUN 20; Creatinine, Ser 1.23; Hemoglobin 13.5; Platelets 120; Potassium 3.8; Sodium 138  06/27/2022 Creatinine 1.32, potassium 4.4, TSH 1.4, hemoglobin 13.7  Recent Lipid Panel Lab Results  Component Value Date/Time   CHOL 163 11/25/2022 07:47 AM   TRIG 73 11/25/2022 07:47 AM   HDL 48 11/25/2022 07:47 AM   CHOLHDL 3.4 11/25/2022 07:47 AM   LDLCALC 100 (H) 11/25/2022 07:47 AM    01/28/2019 Total cholesterol 183, HDL 60, LDL 111, triglycerides 58    Wt Readings from Last 3 Encounters:  01/08/23 216 lb (98 kg)  12/29/22 240 lb (108.9 kg)  11/24/22 212 lb 11.9 oz (96.5 kg)     Objective:    Vital Signs:  There were no vitals taken for this visit.    General: Alert, oriented x3, no distress, overweight. Head: no evidence of trauma, PERRL, EOMI, no exophtalmos or lid lag, no myxedema, no xanthelasma; normal ears, nose and oropharynx Neck: normal jugular venous pulsations and no hepatojugular reflux; brisk carotid pulses without delay and no carotid bruits Chest: clear to auscultation, no signs of consolidation by percussion or palpation, normal fremitus, symmetrical and full respiratory excursions Cardiovascular: normal position and quality of  the apical impulse, regular rhythm, normal first and second heart sounds, no murmurs, rubs or gallops Abdomen: no tenderness or distention, no masses by palpation, no abnormal pulsatility or arterial bruits, normal bowel sounds, no hepatosplenomegaly Extremities: no clubbing, cyanosis or edema; 2+ radial, ulnar and brachial pulses bilaterally; 2+ right femoral, posterior tibial and dorsalis pedis pulses; 2+ left femoral, posterior tibial and dorsalis pedis pulses; no subclavian or femoral bruits Neurological: grossly nonfocal Psych: Normal mood and affect    ASSESSMENT & PLAN:    1. Permanent atrial fibrillation   2. Acquired thrombophilia (HCC)   3. OSA (obstructive sleep apnea)   4. Bipolar affective disorder, currently depressed, mild (HCC)   5. Overweight   6. Hypercholesterolemia   7. Elevated blood pressure, situational        AFib: Good rate control on a low-dose of metoprolol succinate,  which will be continued.  The potential interaction with Wellbutrin is duly noted (increase metoprolol metabolism due to CYP inhibition). CHA2DS2-VASc  4 (age, stroke). Xarelto: No serious bleeding complications.  Had some mild pneumothorax due to during acute bronchitis in June. OSA: May be part of his complaints of fatigue, together with the tachyarrhythmia and depression.   Bipolar disorder: Seems to be reasonably well compensated. Overweight: As before, he tends to be lean when he is in a period of depression.  Recovering right now. Hypercholesterolemia: All parameters in desirable range.   HTN: Questionable diagnosis.  Seems to fluctuate with his weight.  Only antihypertensive medication he is currently taking is the low-dose of metoprolol.  Thurmon Fair, MD, [facility] [phone] office [phone] pager

## 2023-04-15 ENCOUNTER — Ambulatory Visit: Payer: Medicare Other | Attending: Cardiovascular Disease | Admitting: Cardiovascular Disease

## 2023-04-15 ENCOUNTER — Telehealth: Payer: Self-pay | Admitting: Emergency Medicine

## 2023-04-15 ENCOUNTER — Encounter: Payer: Self-pay | Admitting: Cardiovascular Disease

## 2023-04-15 VITALS — BP 139/80 | HR 68 | Ht 72.0 in | Wt 211.2 lb

## 2023-04-15 DIAGNOSIS — Z79899 Other long term (current) drug therapy: Secondary | ICD-10-CM

## 2023-04-15 DIAGNOSIS — E7800 Pure hypercholesterolemia, unspecified: Secondary | ICD-10-CM

## 2023-04-15 DIAGNOSIS — F3131 Bipolar disorder, current episode depressed, mild: Secondary | ICD-10-CM

## 2023-04-15 DIAGNOSIS — E663 Overweight: Secondary | ICD-10-CM

## 2023-04-15 DIAGNOSIS — D6869 Other thrombophilia: Secondary | ICD-10-CM

## 2023-04-15 DIAGNOSIS — I4821 Permanent atrial fibrillation: Secondary | ICD-10-CM

## 2023-04-15 DIAGNOSIS — G4733 Obstructive sleep apnea (adult) (pediatric): Secondary | ICD-10-CM | POA: Diagnosis not present

## 2023-04-15 DIAGNOSIS — R030 Elevated blood-pressure reading, without diagnosis of hypertension: Secondary | ICD-10-CM

## 2023-04-15 DIAGNOSIS — Z9189 Other specified personal risk factors, not elsewhere classified: Secondary | ICD-10-CM

## 2023-04-15 NOTE — Telephone Encounter (Signed)
Called and left message on pt's voicemail stating that EKG is scheduled at [facility] on Wednesday 04/23/23 at 3:45  Called pt's daughter Gunnar Fusi (dpr) gave her the information about appt and she stated that she would let her father know.  Appointment: Wednesday, 04/23/23 at 3:45 Nurse Visit for EKG  [facility] [address] [place], [place]  [address]  Phone number: [phone]

## 2023-04-15 NOTE — Patient Instructions (Signed)
Medication Instructions:  No changes *If you need a refill on your cardiac medications before your next appointment, please call your pharmacy*  Follow-Up: At [facility], you and your health needs are our priority.  As part of our continuing mission to provide you with exceptional heart care, we have created designated Provider Care Teams.  These Care Teams include your primary Cardiologist (physician) and Advanced Practice Providers (APPs -  Physician Assistants and Nurse Practitioners) who all work together to provide you with the care you need, when you need it.  We recommend signing up for the patient portal called "MyChart".  Sign up information is provided on this After Visit Summary.  MyChart is used to connect with patients for Virtual Visits (Telemedicine).  Patients are able to view lab/test results, encounter notes, upcoming appointments, etc.  Non-urgent messages can be sent to your provider as well.   To learn more about what you can do with MyChart, go to [email or web address].    Your next appointment:    Next appointment 08/20/2023  Provider:   Thurmon Fair, MD

## 2023-04-20 ENCOUNTER — Telehealth: Payer: Self-pay

## 2023-04-20 NOTE — Telephone Encounter (Signed)
Prior Authorization Modafinil #[id number] [employer]  Approved Effective:   04/14/23-04/12/24

## 2023-04-22 DIAGNOSIS — F3132 Bipolar disorder, current episode depressed, moderate: Secondary | ICD-10-CM | POA: Diagnosis not present

## 2023-04-22 DIAGNOSIS — Z79899 Other long term (current) drug therapy: Secondary | ICD-10-CM | POA: Diagnosis not present

## 2023-04-23 ENCOUNTER — Ambulatory Visit: Payer: Medicare Other | Attending: Internal Medicine

## 2023-04-23 VITALS — BP 130/68 | HR 68 | Ht 72.0 in | Wt 213.2 lb

## 2023-04-23 DIAGNOSIS — I4891 Unspecified atrial fibrillation: Secondary | ICD-10-CM | POA: Diagnosis not present

## 2023-04-23 DIAGNOSIS — Z9189 Other specified personal risk factors, not elsewhere classified: Secondary | ICD-10-CM

## 2023-04-23 LAB — LITHIUM LEVEL: Lithium Lvl: 0.4 mmol/L — ABNORMAL LOW (ref 0.5–1.2)

## 2023-04-23 LAB — BASIC METABOLIC PANEL
BUN/Creatinine Ratio: 11 (ref 10–24)
BUN: 15 mg/dL (ref 8–27)
CO2: 23 mmol/L (ref 20–29)
Calcium: 9.3 mg/dL (ref 8.6–10.2)
Chloride: 105 mmol/L (ref 96–106)
Creatinine, Ser: 1.34 mg/dL — ABNORMAL HIGH (ref 0.76–1.27)
Glucose: 122 mg/dL — ABNORMAL HIGH (ref 70–99)
Potassium: 3.9 mmol/L (ref 3.5–5.2)
Sodium: 141 mmol/L (ref 134–144)
eGFR: 55 mL/min/{1.73_m2} — ABNORMAL LOW (ref >59)

## 2023-04-23 NOTE — Progress Notes (Signed)
Patient presented today for EKG per provider.

## 2023-04-23 NOTE — Patient Instructions (Signed)
Medication Instructions:  Your physician recommends that you continue on your current medications as directed. Please refer to the Current Medication list given to you today.  *If you need a refill on your cardiac medications before your next appointment, please call your pharmacy*   Lab Work: None If you have labs (blood work) drawn today and your tests are completely normal, you will receive your results only by: MyChart Message (if you have MyChart) OR A paper copy in the mail If you have any lab test that is abnormal or we need to change your treatment, we will call you to review the results.   Testing/Procedures: None   Follow-Up: At [facility], you and your health needs are our priority.  As part of our continuing mission to provide you with exceptional heart care, we have created designated Provider Care Teams.  These Care Teams include your primary Cardiologist (physician) and Advanced Practice Providers (APPs -  Physician Assistants and Nurse Practitioners) who all work together to provide you with the care you need, when you need it.  We recommend signing up for the patient portal called "MyChart".  Sign up information is provided on this After Visit Summary.  MyChart is used to connect with patients for Virtual Visits (Telemedicine).  Patients are able to view lab/test results, encounter notes, upcoming appointments, etc.  Non-urgent messages can be sent to your provider as well.   To learn more about what you can do with MyChart, go to [email or web address].

## 2023-05-07 NOTE — Progress Notes (Signed)
Lithium level 0.4 on 450 HS recently started.

## 2023-05-19 ENCOUNTER — Other Ambulatory Visit: Payer: Self-pay

## 2023-05-26 ENCOUNTER — Telehealth: Payer: Self-pay | Admitting: Psychiatry

## 2023-05-26 ENCOUNTER — Encounter: Payer: Self-pay | Admitting: Psychiatry

## 2023-05-26 ENCOUNTER — Ambulatory Visit: Payer: Medicare Other | Attending: Cardiology | Admitting: Cardiology

## 2023-05-26 ENCOUNTER — Ambulatory Visit: Payer: Medicare Other | Admitting: Psychiatry

## 2023-05-26 ENCOUNTER — Encounter: Payer: Self-pay | Admitting: Cardiology

## 2023-05-26 VITALS — BP 134/66 | HR 88 | Ht 72.0 in | Wt 209.8 lb

## 2023-05-26 DIAGNOSIS — G4733 Obstructive sleep apnea (adult) (pediatric): Secondary | ICD-10-CM

## 2023-05-26 DIAGNOSIS — I639 Cerebral infarction, unspecified: Secondary | ICD-10-CM | POA: Diagnosis not present

## 2023-05-26 DIAGNOSIS — F3132 Bipolar disorder, current episode depressed, moderate: Secondary | ICD-10-CM

## 2023-05-26 DIAGNOSIS — G4710 Hypersomnia, unspecified: Secondary | ICD-10-CM

## 2023-05-26 DIAGNOSIS — I4821 Permanent atrial fibrillation: Secondary | ICD-10-CM | POA: Diagnosis not present

## 2023-05-26 DIAGNOSIS — F411 Generalized anxiety disorder: Secondary | ICD-10-CM | POA: Diagnosis not present

## 2023-05-26 DIAGNOSIS — D6869 Other thrombophilia: Secondary | ICD-10-CM

## 2023-05-26 DIAGNOSIS — Z79899 Other long term (current) drug therapy: Secondary | ICD-10-CM

## 2023-05-26 DIAGNOSIS — I4891 Unspecified atrial fibrillation: Secondary | ICD-10-CM | POA: Diagnosis not present

## 2023-05-26 DIAGNOSIS — Z8669 Personal history of other diseases of the nervous system and sense organs: Secondary | ICD-10-CM

## 2023-05-26 MED ORDER — PRAMIPEXOLE DIHYDROCHLORIDE 0.25 MG PO TABS
ORAL_TABLET | ORAL | 1 refills | Status: DC
Start: 2023-05-26 — End: 2023-06-24

## 2023-05-26 NOTE — Telephone Encounter (Signed)
[facility] pharmacy Marchelle Folks) lvm at 11:50 regarding Mirapex 0.25mg . She needs some clarification on how Idriss is supposed to take medication. Ph: [phone]

## 2023-05-26 NOTE — Telephone Encounter (Signed)
Called pharmacy, line continued ringing. Will try again.

## 2023-05-26 NOTE — Progress Notes (Signed)
Casey [name] [id number] Aug 22, 1945 77 y.o.    Subjective:   Patient ID:  Casey [name] is a 77 y.o. (DOB 1945-09-18) male.  Chief Complaint:  No chief complaint on file.   HPI  [name] presents for follow-up of bipolar disorder and anxiety and history of confusion  At visit  Dec 02, 2018.  He was having confusion and benztropine was stopped to see if that was the cause.  Also it was having balance issues and therefore Trileptal was switched all to nighttime 1200 mg nightly. Confusion much better per both of them with DC benztropine.  Balance better with change Trileptal.   visit was January 13, 2019 and he was doing well as noted above and he was encouraged not to make further med changes at that time.  visit April 13, 2019.  He was doing better with regard to depression than he had in quite some time on the Wellbutrin and no meds were changed.  He was having health problems with pulmonary effusion and fluid removed twice related to fall in January 2020.  seen September 13, 2019.  For residual depression the following decisions were made: Balance problem much improved with dosing Trileptal all in the evening. Depression under partial control & wife wants it to be better per his wife.  No mood swings.   Increase bupropion XL to 2 of the 150 mg tablets in the morning until the current bottle is gone. Then new RX will be 1 of the 300 mg tablets each morning.  November 08, 2019 appointment the following is noted:  Seen with wife, [name]. Small amount of improvement with change.  Anxiety got worse.  Notices when he goes out.  Worries about social and political things.  Wife keeps him active at home.  Allerigies bother him..  No sig caffeine except tea when goes out.  Wife sees him as a little more active and initiative.  More productive.  Started some cooking.  Getting out and up better.  A little more talkative.   "He wants to be hyper but we know what happens". Wife [name] thinks   But  for the last 2 years he's done nothing and laid around all the time.  He's a lot more active now. Big improvement. Went to church first time in 2 years. Plan:  For TRD, Pramipexole 0.25 mg tablet 1/2 twice daily for 1 week then 1 twice daily.  12/20/2019 appointment with the following noted:  Seen with wife. A little better motivated to get out but not to get in crowds.  Wife agrees more motivated and went to church once alone without pressure.  Wife says he's more talkative and much  Better interest.  Still not motivated to do things like mow the grass.  Others's have seen a difference. Concentration and memory are improved but not normal. No Se pramipexole.  Getting out of bed earlier and better now.. Wife admisters meds bc earlier he messsed them up. Plan: Wife has seen a big improvement therefore Partial improvement is clear from this so increase Pramipexole 0.375 mg tablet  twice daily  Anxiety is not fully managed.  Partly related to depression. Tremor is manageable.    02/15/20 appt with the following noted:  Send with wife [name] Doing good.  Increased pramipexole as indicated.  Tolerating meds Wellbutrin XL 300 mg AM & Trileptal 1200 mg HS with it. Much better with depression.  Stays up more and speinding less time in bed.  Wife agrees he's much better and handling things better. 2 1/2 yo 4th cousin girl drowned in pond lately.  Big family. Wife pleased he handled it better. SE constipation ? Related. History Linzess but diarrhea. He's not highly motivated but it is better.  Depression is much improved.  Had been depressed for years. Plan no med changes  06/13/2020 appointment with the following noted: Pretty good but cardiologist noted today increased pulse needing treatment.  Uncontrolled afib even after ablation. Hard to tell about depression bc of fatigue Dt heart problems. Wife said he did well Thanksgiving and participated more in conversation per wife. Plan: no med  changes  09/06/2020 appointment with following noted: Remains on Wellbutrin XL 300 mg every morning, ginkgo biloba 40 mg twice daily, oxcarbazepine 1200 mg nightly, pramipexole 0.75 mg twice daily. Less sig health concerns. He thinks mood has been pretty good.  Consistent with meds. Wife thinks he stays tired and sleepy and wonders if can give him a stimulant.   Sleep good.  Will nap.  Doesn't bother him.  Walks dog 20 min in AM.  Not much initiative per wife.  Not depressed but when depressed he stayed in the bed lasting a couple of years.  History of OSA but lost weight and doesn't think he has apnea anymore.  No CPAP. Chronic afib. Not much caffeine. Plan: Wife& patient has seen a big improvement with Pramipexole 0.375 mg tablet  twice daily They ask consideration be given to the following: If his energy gets better by treating his tachycardia they would like to consider reducing the pramipexole or possibly eliminating it at follow-up.   For energy trial reduction pramipexole to 0.25 mg BID.  If gets more depressed call. Alternative of modafinil 100  12/04/2020 appointment with the following noted: Energy is better with awakening earlier at 8 and doing more and getting out more.  Going to bed later with 8 hour  Sleep. Mood fluctuates with more reacting to wife mainly.  More outspoken.  Per W he has to vent.  She thinks he is too focused on politics and issues.   Plan: Depression under control.  No mood swings.   Continue bupropion XL 300 mg tablets each morning. Continue Trileptal 600 mg 2 at HS. No higher DT anxiety risk and no traditional stimulants DT afib.  Wife & patient has seen a big improvement with Pramipexole and able to reduce to  0.25 mg tablet  twice daily  02/20/2021 appointment with the following noted:  seen with wife Prednisone made him real hyper.  Been off of it for at least a couple weeks.  Hyperverbal and hyperactive.  Hip pain interferes with sleep.  Sees doctor  tomorrow. Average 3-4 hours at night and no naps bc busy.  No excessive spending but more than usual.  Denies appetite disturbance.  Patient reports that energy and motivation have been reduced.  Patient has difficulty with concentration and memory.  Patient denies any suicidal ideation.  W had MI in June. A/P: mild mania Wean Wellbutrin over 2 weeks. Continue Trileptal 600 mg 2 at HS. Wife & patient has seen a big improvement with Pramipexole and able to reduce to  0.25 mg tablet  twice daily. Continue it unless mania does not resolve with less bupropion.  03/26/2021 phone call: [name] stated Luie has slightly improved since last week.He has not been able to sleep and was up at 2 am texting people in a disoriented state.His pcp put him on Ambien 10  mg and this has helped him sleep.However,linda stated he is still working him self too much.She said he is jittery and has to have something to do at all times.He does not want to slow down or get rest.She thinks he needs to cutback on the Mirapex as well. MD response:  I agree with his wife about reducing Mirapex from 1 tablet twice a day to half tablet twice a day for 1 week and if his symptoms are not resolved and stop the Mirapex.  04/18/2021 phone call with wife and MD: Patient is still manic after stopping Wellbutrin and pramipexole.  Wife's concern is hard to get him to settle down at night.  Reviewed with wife prior psychiatric medications which are multiple.  Typically one would use an antipsychotic in this case but she does not think that they worked in the past.  She says he has been on higher doses of Trileptal and tolerated it. Increase Trileptal to 600 mg every morning and 1200 mg nightly for mania.  Keep scheduled appointment.  05/02/2021 wife reports patient still manic: We cannot go higher in the Trileptal because of balance issues.  Therefore olanzapine 10 mg nightly was started and we discussed the option of hospitalization.  We will try to  get the patient in urgently to an appointment.  05/07/2021 appointment with the following noted: seen with wife, [name] Not doing too well.  Trouble getting out of bed.  Wife says he's still up in middle of the night but he says he's sleeping better.  Sleep in recliner downstairs. She says he's still manic with hyperactive, hyper graphia but won't show her what he's writing. On olanzapine  No changes off Wellbutrin and pramipexole. Apparently more balance problems with increase Trileptal to 1800 mg daily.  Dropped back to 1200 mg HS. Admits to some confusion.  Has pulled off labels on bottle per wife. She thinks he's averaging 4-6 hours of sleep and a little better than it was. Wife said he started cussing and he has not done that in the past. Other episodes of inappropriate decision making.  He recognizes he's manic but can't control himself.  Hyperverbal, pressured.  In past had speech problems with mania also.  Neg neuro work up for stroke in the past. SE balance and tremor. Some trouble getting out of chair? Plan: For mania,Increase olanzapine to 15 mg PM Follow-up Friday for urgent visit  05/11/2021 appointment with the following noted: Calming down a little. He thinks he's sleeping better.  She says he needs whole Ambien with olanzapine and last night still not sleeping.  He woke her with complaints of back pain. She's not seeing SE and nor is he.  She's not seeing much change  He says back pain is complicating his sleep and his problem. He tries to stay active.   She says he talks incessantly but not as much to her. She says major life events have triggered mania in him in the past. She notices him making poor decisions. Plan: Increase olanzapine to 20 mg PM  05/24/2021 appointment with the following noted: with D Gunnar Fusi A week ago went of to doctor's office alone and wife called bc he was agitated. Per wife still going wide open but is sleeping better. Yesterday had fever and  talking out of his head and dx UTI. He still feels driven to do chores but distractible and inefficient.   D does not see improvement and nor does wife.   7 hour sleep lately.  Plan: Increase olanzapine to 30 mg in evening Reduce oxcarbazepine to 1 and 1/2 tablets in the evening.  06/04/2021 appointment: Seen with wife Sleeping more 7-9 hours.   Per wife still some confusion and still short tempered.  Less pressured sleep. He won't go to bed when sleepy and waits too long to get in bed and is weak and cannot do it by himself. Wife agrees he's less "wide open" than before and will be able to sit and watch TV show now. B died yesterday. Plan: Continue olanzapine to 30 mg in evening Reduce oxcarbazepine  again to 1 tablets in the evening for 1 week, then 1/2 tablet at night for a week then stop it.  07/04/21 appt noted:  seen with wife [name] Hyper-ness is better.  Some trouble with concentration but able to make peanut butter fudge. Problems with afib.   Sleeping a lot and some instability.  Goes to sleep in chair.  Better with irritability.  No longer talking constantly but still some in spells.   Not depressed.   No change in balance off oxcarbazepine. Wife said last week mixed up meds and got oversedated.  She discovered the option. Plan: Reduce olanzapine to 20 mg PM now that mania is largely resolved.  08/06/2021 appointment with the following noted: Only psych med is reduced olanzapine 20 mg HS RLS and fidgety.  All day.  Can sit for an hour.  Uncomfortable when sitting. Stopped Ginko and wife noticed RLS more.  She restarted it.  No longer rocking back and forth. Caffeine tea at lunch.  No coffee..  [place] some. RLS recently started. Bipolar sx great per wife.  Not wanting to go out much. CO tiredness.  Falls asleep in recliner.  Sleep all night. Eats sweets all his life. Plan: No longer manic. Reduce olanzapine to 15 mg earlier in PM Stop caffeine  09/06/2021 appointment with  the following noted: Reduced olanzapine 15 mg and still RLS all the day. No change in that.  But can sit in church ok.  Will move legs when standing and talking to people. Sleeping more than he should from 10 until 9. And still naps and little initiative bc tired. Not depressed but not back to normal.  Not as social as usual. Loves to eat out. Plan: Reduce olanzapine from 15 to 7.5 mg earlier in PM Stop caffeine  10/02/2021 appointment with the following noted: seen again with wife [name] Only psych med is the reduced dose of olanzapine 7.5 mg for 30 days. Back pain limits activity and getting shots. Taking tramadol for pain.  1-2 prn.  One helps. 2 is sedating. RLS is better and mainly now the problem is back pain.  Has to sleep in recliner DT pain.   [name] says hard to tell about effect from reduced olanzapine bc he's having such back pain and taking tramadol.  Doesn't feel like doing anything. Plan continue olanzapine 7.5 mg every afternoon  12/04/2021 appointment with the following noted: seen with wife Still doing well with olanzapine 7.5 mg PM No SE.  RLS is OK now. Sleep good 8-10 hours. Not depressed nor manic sx.   Happy with meds.   Wife gall bladder surgery. H helping wife. Plan: olanzapine 7.5 mg earlier in PM Stopcaffeine   03/19/2022 phone call: Wife called complaining that her husband sleeping 14 to 15 hours a day on olanzapine 7.5 mg daily and wondering about trying to reduce it. MD response: Olanzapine is his only mood stabilizer  at present.  There is a risk that he could get manic again with the dosage reduction.  But we can try reducing from 7.5 to 5 mg at night.  That may or may not help with his tendency to sleep excessively.  Some people will do that even on low doses of olanzapine.  Please send in prescription for olanzapine 5 mg tablets 1 nightly #30, 1 additional refill and make a note in the pharmacy section of the order to cancel any other olanzapine refills.      06/04/2022 appointment noted:  wife says a tad better with less olanzapine but still wants to sleep and sit around a lot. Keeps saying I'm not depressed.   Reduced olanzapine 5 mg HS. No mania or depression. Hx sleep study and dx OSA but per wife got better with wt loss.  Not using CPAP. Eating less but wt loss stable. Asks about seasonal pattern bc of the history.  She can't remember the seasonal pattern.   Plan: Reduce  DT sleepiness olanzapine 2.5 mg earlier in PM Mild TD   07/29/22 TC: no changes with reduction so increased back to 5 mg olanzapine.  08/14/22 appt noted: Still sleeping too much 12 hours.   Also been irritable with flashes.   Something can set him off. Probably getting depressed some with less olanzapine.  Some anxiety.   Some reduction in interest and not going to church like before and doesn't like to be around people.  Doesn't want wife to help others either.   No restlessness.  Will nap in the afternoon. Not enjoying things and not laughing. Plan: Is getting more depressed again, resume Wellbutrin XL up to 300 mg AM  10/14/22 appt noted:  with wife [name] Psych Med: olanzapine 5 HS, Wellbutrin 300 mg AM Feels a little more like getting out but wife says not much.  She sees him being a little more agitated and irritable.  Still doesn't want to go out normally or be with other people.  Usual self is social.  Don't talk as much.  Doesn't want to be alone and doesn't want wife to be social. Sleeps a lot.  Partly DT back pain. Not nervous or anxious. Plan: Is having apathetic depressed again,  and failed Wellbutrin.   Resume pramipexole off label which worked before,  0.25 mg BID for 4 days then 0.375 mg BID  10/23/22 TC: still dep and wanting to increase pramipexole.  MD resp: incr to 0.5 mg BID  11/24/22 and neuro FU 01/08/23 noted:  follow-up for acute right frontal juxtacortical infarct most slightly due to A-fib due to noncompliance with Xarelto.  Patient is here  with his wife today.  He feels that he has returned to his baseline.   02/27/23 appt noted:  with [name] Since stroke not back to church.  No energy and not wanting to do anything.  Sleep 12 hours and watches TV and recliner.  Wife says sleep excess no change since stroke.  Still dep.   Still wanting to isolate from others. No anxiety. Plan: Stop olanzapine Start Vraylar 1 in the AM Reduce pramipexole to 1 twice daily for 4 days then 1/2 twice daily for 4 days then stop it. This plan will reduce meds and answer question about sleepiness from olanzapine and reduce polypharmacy  03/18/23 TC" Wife reporting no improvement in depression and he is still sleeping all the time. She is also reporting it is too expensive and they would not be able  to afford it, but PA has not been done. Requesting to go back to previous medications.  CC resp: Me  to Franchot Erichsen, CMA  CC   03/19/23  6:39 PM Note It has not been long enough and I need to see him to find an alternative to Vraylar.  Stay on the Vraylar 1.5 mg daily until his appt later this month.  I don't want to resume olanzapine which didn't work until we explore other options.     04/10/23 appt noted: seen with wife Hildred Alamin meds: Vraylar 1.5 mg daily.  No olanzapine, pramipexole stopped. Sleeping about the same but easier to get up per wife on Vraylar.  No change in mood.  Still feels dep.  Not wanting to go out and be around people.  Without change.  No social life.  More HA.   Still dealing with chronic TR afib and tx ongoing. Easily out of breath.   Will walk dog 10 mins.   Tires him.  Sleeps a lot during the day. Hx OSA but since lost wt not needing CPAP Doesn't laugh much. Not going to church DT dep. Worry a lot without change.  Will resolve when not dep.   Plan: Stop Vraylar Start 3 of the 150 mg capsules of lithium at night Get a blood level in the morning 5-7 days later Use modafinil for alertness if needed  05/26/23 appt noted:   seen with D Gunnar Fusi Meds: lithium 450 HS, no Vraylar, modafinil prn.   Lithium level 0.4, Cr 1.34 pretty stable. Mood kind of OK.  Sleep is still too much.   Wife's notes no big change but is awake more than he was.  Hard to get OOB before 11.   EKG per card was normal QT   Past Psychiatric Medication Trials: Seroquel 300,  olanzapine 30 resolved mania,  (Improved  with Increase olanzapine to 30 mg PM and off the oxcarbazepine.) perphenazine, risperidone,  Latuda,   Abilify, Vraylar 1.5 mg for 1 month without benefit Lithium did great helped but SE afib, kidneys and prostate,    alprazolam,  Depakote, Trileptal 1800 balance problems  carbamazepine, lamotrigine caused nausea,  citalopram, sertraline 150,    Wellbutrin 300 anxiety and irritability and failed 10/2022,  Pramipexole 0.375 BID helped Benztropine confusion        1 prior psych hosp depression                                                    Review of Systems:  Review of Systems  Constitutional:  Positive for fatigue.  HENT:         Clicking teeth  Eyes:  Negative for visual disturbance.  Gastrointestinal:  Positive for constipation.  Musculoskeletal:  Positive for arthralgias, back pain and gait problem.  Skin:  Negative for rash.       chronic  Neurological:  Positive for tremors and weakness.       Balance px longterm no worse RLS day and PM  Psychiatric/Behavioral:  Positive for dysphoric mood. Negative for agitation, behavioral problems, confusion, decreased concentration, hallucinations, self-injury, sleep disturbance and suicidal ideas. The patient is not nervous/anxious and is not hyperactive.     Medications: I have reviewed the patient's current medications.  Current Outpatient Medications  Medication Sig Dispense Refill   acetaminophen (TYLENOL) 500  MG tablet Take 1,000 mg by mouth every 6 (six) hours as needed for headache.     cholecalciferol (VITAMIN D) 1000 UNITS tablet Take 2,000 Units by mouth  daily.     levothyroxine (SYNTHROID, LEVOTHROID) 25 MCG tablet Take 25 mcg by mouth daily before breakfast.     lithium carbonate 150 MG capsule Take 3 capsules (450 mg total) by mouth at bedtime. 90 capsule 0   metoprolol succinate (TOPROL-XL) 25 MG 24 hr tablet Take 1 tablet (25 mg total) by mouth daily. 90 tablet 3   modafinil (PROVIGIL) 100 MG tablet 1/2 tablet in the AM for 1 week then 1 tablet in the AM 30 tablet 0   pantoprazole (PROTONIX) 40 MG tablet TAKE ONE TABLET BY MOUTH ONCE DAILY (Patient taking differently: Take 40 mg by mouth daily.) 30 tablet 11   rivaroxaban (XARELTO) 20 MG TABS tablet Take 20 mg by mouth daily after supper.      rosuvastatin (CRESTOR) 20 MG tablet Take 1 tablet (20 mg total) by mouth daily. 30 tablet 0   SUPER B COMPLEX/C PO Take 1 tablet by mouth daily.     No current facility-administered medications for this visit.    Medication Side Effects: resolved  Allergies:  Allergies  Allergen Reactions   Amlodipine Other (See Comments)    Stopped due to kidney issues   Vicodin [Hydrocodone-Acetaminophen] Other (See Comments)    Patient is unsure of reaction    Past Medical History:  Diagnosis Date   Arthritis    Asthma    Atrial fibrillation (HCC) 01/12/2010   2D Echo EF=>55%   Bipolar disorder (HCC)    Chronic back pain    Depression    Dysrhythmia    AFib   GERD (gastroesophageal reflux disease)    History of colonic polyps    History of gout    Hyperlipidemia    Hypertension    Hypothyroidism    Morbid obesity (HCC)    OSA (obstructive sleep apnea)    AHI was 27.62hr RDI was 34.3 hr REM 0.00hr; had CPAP years ago but no longer uses.   Prostatitis    Tubular adenoma of colon 01/2016    Family History  Problem Relation Age of Onset   Diabetes Mother    Heart failure Mother    Hypertension Mother    Hypertension Father    Cancer Paternal Grandmother    Stroke Sister    Liver disease Brother    Vascular Disease Brother     Arthritis Brother    Colon cancer Neg Hx     Social History   Socioeconomic History   Marital status: Married    Spouse name: Not on file   Number of children: Not on file   Years of education: Not on file   Highest education level: Not on file  Occupational History   Not on file  Tobacco Use   Smoking status: Never   Smokeless tobacco: Never  Vaping Use   Vaping status: Never Used  Substance and Sexual Activity   Alcohol use: No    Alcohol/week: 0.0 standard drinks of alcohol   Drug use: No   Sexual activity: Yes    Birth control/protection: None  Other Topics Concern   Not on file  Social History Narrative   Not on file   Social Determinants of Health   Financial Resource Strain: Not on file  Food Insecurity: No Food Insecurity (11/24/2022)   Hunger Vital Sign  Worried About Programme researcher, broadcasting/film/video in the Last Year: Never true    Ran Out of Food in the Last Year: Never true  Transportation Needs: No Transportation Needs (11/24/2022)   PRAPARE - Administrator, Civil Service (Medical): No    Lack of Transportation (Non-Medical): No  Physical Activity: Not on file  Stress: Not on file  Social Connections: Not on file  Intimate Partner Violence: Patient Declined (11/24/2022)   Humiliation, Afraid, Rape, and Kick questionnaire    Fear of Current or Ex-Partner: Patient declined    Emotionally Abused: Patient declined    Physically Abused: Patient declined    Sexually Abused: Patient declined    Past Medical History, Surgical history, Social history, and Family history were reviewed and updated as appropriate.   Please see review of systems for further details on the patient's review from today.   Objective:   Physical Exam:  There were no vitals taken for this visit.  Physical Exam Constitutional:      General: He is not in acute distress. Musculoskeletal:        General: No deformity.  Neurological:     Mental Status: He is alert and oriented to  person, place, and time.     Coordination: Coordination normal.     Gait: Gait normal.     Comments: Mild rocking motions legs resolved Mild lip licking and buccal movements  Psychiatric:        Attention and Perception: He is attentive. He does not perceive auditory hallucinations.        Mood and Affect: Mood is depressed. Mood is not anxious. Affect is blunt. Affect is not labile, angry or inappropriate.        Speech: Speech is not rapid and pressured or tangential.        Behavior: Behavior normal. Behavior is not agitated.        Thought Content: Thought content normal. Thought content is not delusional. Thought content does not include homicidal or suicidal ideation. Thought content does not include suicidal plan.        Cognition and Memory: Cognition normal.        Judgment: Judgment normal.     Comments: Insight intact. No auditory or visual hallucinations. No delusions.  More depressed again Gait is a little slow but not marked. Lip smacking largely resolved.     Lab Review:     Component Value Date/Time   NA 141 04/22/2023 1523   K 3.9 04/22/2023 1523   CL 105 04/22/2023 1523   CO2 23 04/22/2023 1523   GLUCOSE 122 (H) 04/22/2023 1523   GLUCOSE 104 (H) 12/29/2022 0443   BUN 15 04/22/2023 1523   CREATININE 1.34 (H) 04/22/2023 1523   CALCIUM 9.3 04/22/2023 1523   PROT 7.0 11/24/2022 1252   PROT 6.6 12/28/2018 0941   ALBUMIN 3.9 11/24/2022 1252   ALBUMIN 4.5 12/28/2018 0941   AST 18 11/24/2022 1252   ALT 13 11/24/2022 1252   ALKPHOS 70 11/24/2022 1252   BILITOT 0.8 11/24/2022 1252   BILITOT 0.7 12/28/2018 0941   GFRNONAA >60 12/29/2022 0443   GFRAA 63 12/28/2018 0941       Component Value Date/Time   WBC 5.7 12/29/2022 0443   RBC 4.23 12/29/2022 0443   HGB 13.5 12/29/2022 0443   HGB 15.2 12/28/2018 0941   HCT 40.1 12/29/2022 0443   HCT 42.9 12/28/2018 0941   PLT 120 (L) 12/29/2022 0443   PLT 186 12/28/2018 0941  MCV 94.8 12/29/2022 0443   MCV 89  12/28/2018 0941   MCH 31.9 12/29/2022 0443   MCHC 33.7 12/29/2022 0443   RDW 13.2 12/29/2022 0443   RDW 12.9 12/28/2018 0941   LYMPHSABS 0.7 12/29/2022 0443   LYMPHSABS 1.1 12/28/2018 0941   MONOABS 0.5 12/29/2022 0443   EOSABS 0.2 12/29/2022 0443   EOSABS 0.1 12/28/2018 0941   BASOSABS 0.0 12/29/2022 0443   BASOSABS 0.0 12/28/2018 0941    Lithium Lvl  Date Value Ref Range Status  04/22/2023 0.4 (L) 0.5 - 1.2 mmol/L Final    Comment:    A concentration of 0.5-0.8 mmol/L is advised for long-term use; concentrations of up to 1.2 mmol/L may be necessary during acute treatment.                                  Detection Limit = 0.1                           <0.1 indicates None Detected      No results found for: "PHENYTOIN", "PHENOBARB", "VALPROATE", "CBMZ"   .res Assessment: Plan:    There are no diagnoses linked to this encounter.   30 min face to face time with patient was spent on counseling and coordination of care. We discussed Dx bipolar at 77 yo.  Bipolar depression has recurred again like last year but so far is not as severe.  Patient is very complicated because of weakness and medical problems.    History pseudodementia with depression.   But depressed again. Hypersomnolence.   Prednisone triggered some mania in 2023 and persisted off prednisone.    Very Mild lip smacking consistent with TD but appears better.  No change in energy, dep, hypersomnolence with switch from olanzapine to Vraylar.  No RLS  and sleeping too much still RLS better with less olanzapine.   If still a problem with sleepiness DT multiple med failures consider modafinil to manage.  Cr is normal.  Failed multiple alternatives.  Disc risks lithium but it is what worked best.  Will try LED.    lithium 450 HS.  Lithium 0.4.   Option modafinil if not better in a couple of weeks.  Normal EKG since here  Resume pramipexole increase, to 0.375 mg BID  Follow-up 4 weeks  Meredith Staggers, MD,  DFAPA   Please see After Visit Summary for patient specific instructions.   Future Appointments  Date Time Provider Department Center  05/26/2023  3:15 PM Nobie Putnam, MD [facility]  06/24/2023 11:00 AM Cottle, Steva Ready., MD [facility] None  08/20/2023  8:40 AM Croitoru, Rachelle Hora, MD [facility] None      No orders of the defined types were placed in this encounter.      -------------------------------

## 2023-05-26 NOTE — Patient Instructions (Addendum)
Medication Instructions:  Your physician recommends that you continue on your current medications as directed. Please refer to the Current Medication list given to you today.  *If you need a refill on your cardiac medications before your next appointment, please call your pharmacy*  Lab Work: TODAY: BMET  If you have labs (blood work) drawn today and your tests are completely normal, you will receive your results only by: MyChart Message (if you have MyChart) OR A paper copy in the mail If you have any lab test that is abnormal or we need to change your treatment, we will call you to review the results.  Testing/Procedures: Your physician has requested that you have cardiac CT. Cardiac computed tomography (CT) is a painless test that uses an x-ray machine to take clear, detailed pictures of your heart. For further information please visit [email or web address] Please follow instruction sheet as given.  Your physician has requested that you have Left atrial appendage (LAA) closure device implantation is a procedure to put a small device in the LAA of the heart. The LAA is a small sac in the wall of the heart's left upper chamber. Blood clots can form in this area. The device, Watchman closes the LAA to help prevent a blood clot and stroke.   Follow-Up: At [facility], you and your health needs are our priority.  As part of our continuing mission to provide you with exceptional heart care, we have created designated Provider Care Teams.  These Care Teams include your primary Cardiologist (physician) and Advanced Practice Providers (APPs -  Physician Assistants and Nurse Practitioners) who all work together to provide you with the care you need, when you need it.   Your next appointment:   You will be contacted by Nurse Navigator, Casey [name] to schedule your pre-procedure visit and procedure date. If you have any questions she can be reached at [phone].

## 2023-05-26 NOTE — Progress Notes (Signed)
Electrophysiology Office Note:   Date:  05/26/2023  ID:  Casey [name], DOB Dec 05, 1945, MRN [id number]  Primary Cardiologist: Thurmon Fair, MD Electrophysiologist: Nobie Putnam, MD      History of Present Illness:   Casey [name] is a 77 y.o. male with h/o stroke, atrial fibrillation, obstructive sleep apnea, ?hypertension, and bipolar disorder who is seen today for evaluation for Watchman device implant at the request of Dr. Royann Shivers.  He reportedly had a stroke while off/noncompliant with oral anticoagulation.  He admits to having difficulty maintaining adherence to oral anticoagulation, which he attributes to difficulty remembering his various regimens of numerous medications and frequent medication changes.  His compliance also appears to change with his mood related to bipolar disorder.  He is also leery of taking his anticoagulation given that he has had bleeding problems in the past.  He presented to the ED several months ago with hemoptysis.  He has had a GI bleed requiring transfusion following a polypectomy.  He currently states that he is compliant with his Xarelto.  He has no new or acute complaints and is otherwise relatively well.  Review of systems complete and found to be negative unless listed in HPI.   EP Information / Studies Reviewed:    EKG is not ordered today. EKG from 04/23/23 reviewed which showed atrial fibrillation      Echo 11/25/2022: Normal LV size and function.  LVEF is 60 to 65%. Normal RV size and function. Aortic sclerosis without stenosis.  No significant valvular disease.   Risk Assessment/Calculations:    CHA2DS2-VASc Score = 4   This indicates a 4.8% annual risk of stroke. The patient's score is based upon: CHF History: 0 HTN History: 0 Diabetes History: 0 Stroke History: 2 Vascular Disease History: 0 Age Score: 2 Gender Score: 0             Physical Exam:   VS:  BP 134/66   Pulse 88   Ht 6' (1.829 m)   Wt 209 lb 12.8 oz (95.2 kg)    SpO2 98%   BMI 28.45 kg/m    Wt Readings from Last 3 Encounters:  05/26/23 209 lb 12.8 oz (95.2 kg)  04/23/23 213 lb 3.2 oz (96.7 kg)  04/15/23 211 lb 3.2 oz (95.8 kg)     GEN: Well nourished, well developed in no acute distress NECK: No JVD; No carotid bruits CARDIAC: Normal rate and irregular rhythm. RESPIRATORY:  Clear to auscultation without rales, wheezing or rhonchi  ABDOMEN: Soft, non-tender, non-distended EXTREMITIES:  No edema; No deformity   ASSESSMENT AND PLAN:   Casey [name] is a 77 y.o. male with h/o stroke, atrial fibrillation, obstructive sleep apnea, ?hypertension, and bipolar disorder who is seen today for evaluation for Watchman device implant at the request of Dr. Royann Shivers.   # Permanent atrial fibrillation # Secondary hypercoagulable state due to atrial fibrillation # History of stroke  I have seen Casey [name] in the office today who is being considered for a Watchman left atrial appendage closure device. I believe they will benefit from this procedure given their history of atrial fibrillation, CHA2DS2-VASc score of at least 4, maybe 5 (?hypertension), and unadjusted ischemic stroke rate of 4.8% per year. Unfortunately, the patient is not felt to be a long term anticoagulation candidate secondary to history of nonadherence and bleeding. The patient's chart has been reviewed and I feel that they would be a candidate for short term oral anticoagulation after Watchman implant.  It is my belief that after undergoing a LAA closure procedure, Casey [name] will not need long term anticoagulation which eliminates anticoagulation side effects and major bleeding risk.   Procedural risks for the Watchman implant have been reviewed with the patient including a 0.5% risk of stroke, <1% risk of perforation and <1% risk of device embolization. Other risks include bleeding, vascular damage, tamponade, worsening renal function, and death. The patient understands these risk  and wishes to proceed.     The published clinical data on the safety and effectiveness of WATCHMAN include but are not limited to the following: - Holmes DR, Everlene Farrier, Sick P et al. for the PROTECT AF Investigators. Percutaneous closure of the left atrial appendage versus warfarin therapy for prevention of stroke in patients with atrial fibrillation: a randomised non-inferiority trial. Lancet 2009; 374: 534-42. Everlene Farrier, Doshi SK, Isa Rankin D et al. on behalf of the PROTECT AF Investigators. Percutaneous Left Atrial Appendage Closure for Stroke Prophylaxis in Patients With Atrial Fibrillation 2.3-Year Follow-up of the PROTECT AF (Watchman Left Atrial Appendage System for Embolic Protection in Patients With Atrial Fibrillation) Trial. Circulation 2013; 127:720-729. - Alli O, Doshi S,  Kar S, Reddy VY, Sievert H et al. Quality of Life Assessment in the Randomized PROTECT AF (Percutaneous Closure of the Left Atrial Appendage Versus Warfarin Therapy for Prevention of Stroke in Patients With Atrial Fibrillation) Trial of Patients at Risk for Stroke With Nonvalvular Atrial Fibrillation. J Am Coll Cardiol 2013; 61:1790-8. Aline August DR, Mia [place], Price M, Whisenant B, Sievert H, Doshi S, Huber K, Reddy V. Prospective randomized evaluation of the Watchman left atrial appendage Device in patients with atrial fibrillation versus long-term warfarin therapy; the PREVAIL trial. Journal of the [employer] of Cardiology, Vol. 4, No. 1, 2014, 1-11. - Kar S, Doshi SK, Sadhu A, Horton R, Osorio J et al. Primary outcome evaluation of a next-generation left atrial appendage closure device: results from the PINNACLE FLX trial. Circulation 2021;143(18)1754-1762.   After today's visit with the patient which was dedicated solely for shared decision making visit regarding LAA closure device, the patient decided to proceed with the LAA appendage closure procedure scheduled to be done in the near future at [facility]. Prior to the procedure, I would like to obtain a gated CT scan of the chest with contrast timed for PV/LA visualization.    Signed,  Nobie Putnam, MD    05/26/2023 9:34 PM

## 2023-05-26 NOTE — Telephone Encounter (Signed)
Spoke with pharmacist. Read lv note/sig.

## 2023-05-27 ENCOUNTER — Other Ambulatory Visit: Payer: Self-pay | Admitting: Psychiatry

## 2023-05-27 DIAGNOSIS — F3132 Bipolar disorder, current episode depressed, moderate: Secondary | ICD-10-CM

## 2023-05-27 LAB — BASIC METABOLIC PANEL
BUN/Creatinine Ratio: 12 (ref 10–24)
BUN: 18 mg/dL (ref 8–27)
CO2: 24 mmol/L (ref 20–29)
Calcium: 9.5 mg/dL (ref 8.6–10.2)
Chloride: 107 mmol/L — ABNORMAL HIGH (ref 96–106)
Creatinine, Ser: 1.45 mg/dL — ABNORMAL HIGH (ref 0.76–1.27)
Glucose: 107 mg/dL — ABNORMAL HIGH (ref 70–99)
Potassium: 4.6 mmol/L (ref 3.5–5.2)
Sodium: 144 mmol/L (ref 134–144)
eGFR: 50 mL/min/{1.73_m2} — ABNORMAL LOW (ref >59)

## 2023-06-06 ENCOUNTER — Telehealth: Payer: Self-pay

## 2023-06-06 NOTE — Telephone Encounter (Signed)
After several attempts, reached the patient to schedule pre-LAAO CT. He and his wife are in the car. They will look for CT instructions over the weekend and I will call Monday morning to schedule CT and review instructions together. They were grateful for call and agreed with plan.

## 2023-06-09 NOTE — Telephone Encounter (Signed)
Spoke with Mr. And Mrs. [name]. Scheduled Casey [name] for pre-LAAO CT 06/19/2023 at 11:00AM (with 10:30AM arrival time). Instructions reviewed. They were grateful for call and agreed with plan.

## 2023-06-19 ENCOUNTER — Ambulatory Visit (HOSPITAL_COMMUNITY)
Admission: RE | Admit: 2023-06-19 | Discharge: 2023-06-19 | Disposition: A | Discharge status: Home / Self Care | Payer: Medicare Other | Source: Ambulatory Visit | Attending: Cardiology | Admitting: Cardiology

## 2023-06-19 DIAGNOSIS — I4821 Permanent atrial fibrillation: Secondary | ICD-10-CM | POA: Diagnosis not present

## 2023-06-19 MED ORDER — IOHEXOL 350 MG/ML SOLN
100.0000 mL | Freq: Once | INTRAVENOUS | Status: AC | PRN
  Administered 2023-06-19: 100 mL via INTRAVENOUS

## 2023-06-24 ENCOUNTER — Ambulatory Visit: Payer: Medicare Other | Admitting: Psychiatry

## 2023-06-24 ENCOUNTER — Encounter: Payer: Self-pay | Admitting: Psychiatry

## 2023-06-24 DIAGNOSIS — Z8669 Personal history of other diseases of the nervous system and sense organs: Secondary | ICD-10-CM

## 2023-06-24 DIAGNOSIS — G4733 Obstructive sleep apnea (adult) (pediatric): Secondary | ICD-10-CM

## 2023-06-24 DIAGNOSIS — F411 Generalized anxiety disorder: Secondary | ICD-10-CM | POA: Diagnosis not present

## 2023-06-24 DIAGNOSIS — Z79899 Other long term (current) drug therapy: Secondary | ICD-10-CM

## 2023-06-24 DIAGNOSIS — F3132 Bipolar disorder, current episode depressed, moderate: Secondary | ICD-10-CM

## 2023-06-24 DIAGNOSIS — G4710 Hypersomnia, unspecified: Secondary | ICD-10-CM | POA: Diagnosis not present

## 2023-06-24 MED ORDER — PRAMIPEXOLE DIHYDROCHLORIDE 0.5 MG PO TABS
ORAL_TABLET | ORAL | 1 refills | Status: DC
Start: 2023-06-24 — End: 2023-07-29

## 2023-06-24 NOTE — Progress Notes (Signed)
Casey [name] [id number] 1945/12/05 77 y.o.    Subjective:   Patient ID:  Casey [name] is a 77 y.o. (DOB [birth year]) male.  Chief Complaint:  Chief Complaint  Patient presents with   Follow-up   Depression   Medication Reaction    HPI  Casey [name] presents for follow-up of bipolar disorder and anxiety and history of confusion  At visit  Dec 02, 2018.  He was having confusion and benztropine was stopped to see if that was the cause.  Also it was having balance issues and therefore Trileptal was switched all to nighttime 1200 mg nightly. Confusion much better per both of them with DC benztropine.  Balance better with change Trileptal.   visit was January 13, 2019 and he was doing well as noted above and he was encouraged not to make further med changes at that time.  visit April 13, 2019.  He was doing better with regard to depression than he had in quite some time on the Wellbutrin and no meds were changed.  He was having health problems with pulmonary effusion and fluid removed twice related to fall in January 2020.  seen September 13, 2019.  For residual depression the following decisions were made: Balance problem much improved with dosing Trileptal all in the evening. Depression under partial control & wife wants it to be better per his wife.  No mood swings.   Increase bupropion XL to 2 of the 150 mg tablets in the morning until the current bottle is gone. Then new RX will be 1 of the 300 mg tablets each morning.  November 08, 2019 appointment the following is noted:  Seen with wife, [name]. Small amount of improvement with change.  Anxiety got worse.  Notices when he goes out.  Worries about social and political things.  Wife keeps him active at home.  Allerigies bother him..  No sig caffeine except tea when goes out.  Wife sees him as a little more active and initiative.  More productive.  Started some cooking.  Getting out and up better.  A little more talkative.   "He wants  to be hyper but we know what happens". Wife [name] thinks   But for the last 2 years he's done nothing and laid around all the time.  He's a lot more active now. Big improvement. Went to church first time in 2 years. Plan:  For TRD, Pramipexole 0.25 mg tablet 1/2 twice daily for 1 week then 1 twice daily.  12/20/2019 appointment with the following noted:  Seen with wife. A little better motivated to get out but not to get in crowds.  Wife agrees more motivated and went to church once alone without pressure.  Wife says he's more talkative and much  Better interest.  Still not motivated to do things like mow the grass.  Others's have seen a difference. Concentration and memory are improved but not normal. No Se pramipexole.  Getting out of bed earlier and better now.. Wife admisters meds bc earlier he messsed them up. Plan: Wife has seen a big improvement therefore Partial improvement is clear from this so increase Pramipexole 0.375 mg tablet  twice daily  Anxiety is not fully managed.  Partly related to depression. Tremor is manageable.    02/15/20 appt with the following noted:  Send with wife [name] Doing good.  Increased pramipexole as indicated.  Tolerating meds Wellbutrin XL 300 mg AM & Trileptal 1200 mg HS with it. Much better with  depression.  Stays up more and speinding less time in bed.   Wife agrees he's much better and handling things better. 2 1/2 yo 4th cousin girl drowned in pond lately.  Big family. Wife pleased he handled it better. SE constipation ? Related. History Linzess but diarrhea. He's not highly motivated but it is better.  Depression is much improved.  Had been depressed for years. Plan no med changes  06/13/2020 appointment with the following noted: Pretty good but cardiologist noted today increased pulse needing treatment.  Uncontrolled afib even after ablation. Hard to tell about depression bc of fatigue Dt heart problems. Wife said he did well Thanksgiving and  participated more in conversation per wife. Plan: no med changes  09/06/2020 appointment with following noted: Remains on Wellbutrin XL 300 mg every morning, ginkgo biloba 40 mg twice daily, oxcarbazepine 1200 mg nightly, pramipexole 0.75 mg twice daily. Less sig health concerns. He thinks mood has been pretty good.  Consistent with meds. Wife thinks he stays tired and sleepy and wonders if can give him a stimulant.   Sleep good.  Will nap.  Doesn't bother him.  Walks dog 20 min in AM.  Not much initiative per wife.  Not depressed but when depressed he stayed in the bed lasting a couple of years.  History of OSA but lost weight and doesn't think he has apnea anymore.  No CPAP. Chronic afib. Not much caffeine. Plan: Wife& patient has seen a big improvement with Pramipexole 0.375 mg tablet  twice daily They ask consideration be given to the following: If his energy gets better by treating his tachycardia they would like to consider reducing the pramipexole or possibly eliminating it at follow-up.   For energy trial reduction pramipexole to 0.25 mg BID.  If gets more depressed call. Alternative of modafinil 100  12/04/2020 appointment with the following noted: Energy is better with awakening earlier at 8 and doing more and getting out more.  Going to bed later with 8 hour  Sleep. Mood fluctuates with more reacting to wife mainly.  More outspoken.  Per W he has to vent.  She thinks he is too focused on politics and issues.   Plan: Depression under control.  No mood swings.   Continue bupropion XL 300 mg tablets each morning. Continue Trileptal 600 mg 2 at HS. No higher DT anxiety risk and no traditional stimulants DT afib.  Wife & patient has seen a big improvement with Pramipexole and able to reduce to  0.25 mg tablet  twice daily  02/20/2021 appointment with the following noted:  seen with wife Prednisone made him real hyper.  Been off of it for at least a couple weeks.  Hyperverbal and  hyperactive.  Hip pain interferes with sleep.  Sees doctor tomorrow. Average 3-4 hours at night and no naps bc busy.  No excessive spending but more than usual.  Denies appetite disturbance.  Patient reports that energy and motivation have been reduced.  Patient has difficulty with concentration and memory.  Patient denies any suicidal ideation.  W had MI in June. A/P: mild mania Wean Wellbutrin over 2 weeks. Continue Trileptal 600 mg 2 at HS. Wife & patient has seen a big improvement with Pramipexole and able to reduce to  0.25 mg tablet  twice daily. Continue it unless mania does not resolve with less bupropion.  03/26/2021 phone call: [name] stated Casey [name] has slightly improved since last week.He has not been able to sleep and was up at 2  am texting people in a disoriented state.His pcp put him on Ambien 10 mg and this has helped him sleep.However,linda stated he is still working him self too much.She said he is jittery and has to have something to do at all times.He does not want to slow down or get rest.She thinks he needs to cutback on the Mirapex as well. MD response:  I agree with his wife about reducing Mirapex from 1 tablet twice a day to half tablet twice a day for 1 week and if his symptoms are not resolved and stop the Mirapex.  04/18/2021 phone call with wife and MD: Patient is still manic after stopping Wellbutrin and pramipexole.  Wife's concern is hard to get him to settle down at night.  Reviewed with wife prior psychiatric medications which are multiple.  Typically one would use an antipsychotic in this case but she does not think that they worked in the past.  She says he has been on higher doses of Trileptal and tolerated it. Increase Trileptal to 600 mg every morning and 1200 mg nightly for mania.  Keep scheduled appointment.  05/02/2021 wife reports patient still manic: We cannot go higher in the Trileptal because of balance issues.  Therefore olanzapine 10 mg nightly was started and  we discussed the option of hospitalization.  We will try to get the patient in urgently to an appointment.  05/07/2021 appointment with the following noted: seen with wife, [name] Not doing too well.  Trouble getting out of bed.  Wife says he's still up in middle of the night but he says he's sleeping better.  Sleep in recliner downstairs. She says he's still manic with hyperactive, hyper graphia but won't show her what he's writing. On olanzapine  No changes off Wellbutrin and pramipexole. Apparently more balance problems with increase Trileptal to 1800 mg daily.  Dropped back to 1200 mg HS. Admits to some confusion.  Has pulled off labels on bottle per wife. She thinks he's averaging 4-6 hours of sleep and a little better than it was. Wife said he started cussing and he has not done that in the past. Other episodes of inappropriate decision making.  He recognizes he's manic but can't control himself.  Hyperverbal, pressured.  In past had speech problems with mania also.  Neg neuro work up for stroke in the past. SE balance and tremor. Some trouble getting out of chair? Plan: For mania,Increase olanzapine to 15 mg PM Follow-up Friday for urgent visit  05/11/2021 appointment with the following noted: Calming down a little. He thinks he's sleeping better.  She says he needs whole Ambien with olanzapine and last night still not sleeping.  He woke her with complaints of back pain. She's not seeing SE and nor is he.  She's not seeing much change  He says back pain is complicating his sleep and his problem. He tries to stay active.   She says he talks incessantly but not as much to her. She says major life events have triggered mania in him in the past. She notices him making poor decisions. Plan: Increase olanzapine to 20 mg PM  05/24/2021 appointment with the following noted: with D Gunnar Fusi A week ago went of to doctor's office alone and wife called bc he was agitated. Per wife still going wide  open but is sleeping better. Yesterday had fever and talking out of his head and dx UTI. He still feels driven to do chores but distractible and inefficient.   D does  not see improvement and nor does wife.   7 hour sleep lately. Plan: Increase olanzapine to 30 mg in evening Reduce oxcarbazepine to 1 and 1/2 tablets in the evening.  06/04/2021 appointment: Seen with wife Sleeping more 7-9 hours.   Per wife still some confusion and still short tempered.  Less pressured sleep. He won't go to bed when sleepy and waits too long to get in bed and is weak and cannot do it by himself. Wife agrees he's less "wide open" than before and will be able to sit and watch TV show now. B died yesterday. Plan: Continue olanzapine to 30 mg in evening Reduce oxcarbazepine  again to 1 tablets in the evening for 1 week, then 1/2 tablet at night for a week then stop it.  07/04/21 appt noted:  seen with wife [name] Hyper-ness is better.  Some trouble with concentration but able to make peanut butter fudge. Problems with afib.   Sleeping a lot and some instability.  Goes to sleep in chair.  Better with irritability.  No longer talking constantly but still some in spells.   Not depressed.   No change in balance off oxcarbazepine. Wife said last week mixed up meds and got oversedated.  She discovered the option. Plan: Reduce olanzapine to 20 mg PM now that mania is largely resolved.  08/06/2021 appointment with the following noted: Only psych med is reduced olanzapine 20 mg HS RLS and fidgety.  All day.  Can sit for an hour.  Uncomfortable when sitting. Stopped Ginko and wife noticed RLS more.  She restarted it.  No longer rocking back and forth. Caffeine tea at lunch.  No coffee..  [place] some. RLS recently started. Bipolar sx great per wife.  Not wanting to go out much. CO tiredness.  Falls asleep in recliner.  Sleep all night. Eats sweets all his life. Plan: No longer manic. Reduce olanzapine to 15 mg  earlier in PM Stop caffeine  09/06/2021 appointment with the following noted: Reduced olanzapine 15 mg and still RLS all the day. No change in that.  But can sit in church ok.  Will move legs when standing and talking to people. Sleeping more than he should from 10 until 9. And still naps and little initiative bc tired. Not depressed but not back to normal.  Not as social as usual. Loves to eat out. Plan: Reduce olanzapine from 15 to 7.5 mg earlier in PM Stop caffeine  10/02/2021 appointment with the following noted: seen again with wife [name] Only psych med is the reduced dose of olanzapine 7.5 mg for 30 days. Back pain limits activity and getting shots. Taking tramadol for pain.  1-2 prn.  One helps. 2 is sedating. RLS is better and mainly now the problem is back pain.  Has to sleep in recliner DT pain.   [name] says hard to tell about effect from reduced olanzapine bc he's having such back pain and taking tramadol.  Doesn't feel like doing anything. Plan continue olanzapine 7.5 mg every afternoon  12/04/2021 appointment with the following noted: seen with wife Still doing well with olanzapine 7.5 mg PM No SE.  RLS is OK now. Sleep good 8-10 hours. Not depressed nor manic sx.   Happy with meds.   Wife gall bladder surgery. H helping wife. Plan: olanzapine 7.5 mg earlier in PM Stopcaffeine   03/19/2022 phone call: Wife called complaining that her husband sleeping 14 to 15 hours a day on olanzapine 7.5 mg daily and wondering  about trying to reduce it. MD response: Olanzapine is his only mood stabilizer at present.  There is a risk that he could get manic again with the dosage reduction.  But we can try reducing from 7.5 to 5 mg at night.  That may or may not help with his tendency to sleep excessively.  Some people will do that even on low doses of olanzapine.  Please send in prescription for olanzapine 5 mg tablets 1 nightly #30, 1 additional refill and make a note in the pharmacy section  of the order to cancel any other olanzapine refills.     06/04/2022 appointment noted:  wife says a tad better with less olanzapine but still wants to sleep and sit around a lot. Keeps saying I'm not depressed.   Reduced olanzapine 5 mg HS. No mania or depression. Hx sleep study and dx OSA but per wife got better with wt loss.  Not using CPAP. Eating less but wt loss stable. Asks about seasonal pattern bc of the history.  She can't remember the seasonal pattern.   Plan: Reduce  DT sleepiness olanzapine 2.5 mg earlier in PM Mild TD   07/29/22 TC: no changes with reduction so increased back to 5 mg olanzapine.  08/14/22 appt noted: Still sleeping too much 12 hours.   Also been irritable with flashes.   Something can set him off. Probably getting depressed some with less olanzapine.  Some anxiety.   Some reduction in interest and not going to church like before and doesn't like to be around people.  Doesn't want wife to help others either.   No restlessness.  Will nap in the afternoon. Not enjoying things and not laughing. Plan: Is getting more depressed again, resume Wellbutrin XL up to 300 mg AM  10/14/22 appt noted:  with wife [name] Psych Med: olanzapine 5 HS, Wellbutrin 300 mg AM Feels a little more like getting out but wife says not much.  She sees him being a little more agitated and irritable.  Still doesn't want to go out normally or be with other people.  Usual self is social.  Don't talk as much.  Doesn't want to be alone and doesn't want wife to be social. Sleeps a lot.  Partly DT back pain. Not nervous or anxious. Plan: Is having apathetic depressed again,  and failed Wellbutrin.   Resume pramipexole off label which worked before,  0.25 mg BID for 4 days then 0.375 mg BID  10/23/22 TC: still dep and wanting to increase pramipexole.  MD resp: incr to 0.5 mg BID  11/24/22 and neuro FU 01/08/23 noted:  follow-up for acute right frontal juxtacortical infarct most slightly due to  A-fib due to noncompliance with Xarelto.  Patient is here with his wife today.  He feels that he has returned to his baseline.   02/27/23 appt noted:  with [name] Since stroke not back to church.  No energy and not wanting to do anything.  Sleep 12 hours and watches TV and recliner.  Wife says sleep excess no change since stroke.  Still dep.   Still wanting to isolate from others. No anxiety. Plan: Stop olanzapine Start Vraylar 1 in the AM Reduce pramipexole to 1 twice daily for 4 days then 1/2 twice daily for 4 days then stop it. This plan will reduce meds and answer question about sleepiness from olanzapine and reduce polypharmacy  03/18/23 TC" Wife reporting no improvement in depression and he is still sleeping all the time. She  is also reporting it is too expensive and they would not be able to afford it, but PA has not been done. Requesting to go back to previous medications.  CC resp: Me  to Franchot Erichsen, CMA  CC   03/19/23  6:39 PM Note It has not been long enough and I need to see him to find an alternative to Vraylar.  Stay on the Vraylar 1.5 mg daily until his appt later this month.  I don't want to resume olanzapine which didn't work until we explore other options.     04/10/23 appt noted: seen with wife Hildred Alamin meds: Vraylar 1.5 mg daily.  No olanzapine, pramipexole stopped. Sleeping about the same but easier to get up per wife on Vraylar.  No change in mood.  Still feels dep.  Not wanting to go out and be around people.  Without change.  No social life.  More HA.   Still dealing with chronic TR afib and tx ongoing. Easily out of breath.   Will walk dog 10 mins.   Tires him.  Sleeps a lot during the day. Hx OSA but since lost wt not needing CPAP Doesn't laugh much. Not going to church DT dep. Worry a lot without change.  Will resolve when not dep.   Plan: Stop Vraylar Start 3 of the 150 mg capsules of lithium at night Get a blood level in the morning 5-7 days later Use  modafinil for alertness if needed  05/26/23 appt noted:  seen with D Gunnar Fusi Meds: lithium 450 HS, no Vraylar, modafinil prn.   Lithium level 0.4, Cr 1.34 pretty stable. Mood kind of OK.  Sleep is still too much.   Wife's notes no big change but is awake more than he was.  Hard to get OOB before 11.   EKG per card was normal QT but history "permanent atrial fibrillation" with hx stroke DT unintentional noncompliance with anticoagulation.  Plan:  lithium 450 HS.  Lithium 0.4.   Option modafinil if not better in a couple of weeks.  Resume pramipexole increase, to 0.375 mg BID off label for depression.  06/24/23 appt noted:  seen with wife, Hildred Alamin meds: Lithium 450 nightly, pramipexole 0.25 mg tablets 1-1/2 twice daily, no modafinil 100 mg every morning About the same but a little better.  Getting out and going more . Per wife seems some better but she doesn't think he's doing much  and not as much as normal.  Not sig more social and not normal for  him. No sig SE.   Physically is moving better.   To bed 9 and will get up at 930 am instead of 11.   Pramipexole helped a little. Mild stroke in May.  Will have Watchman device placed.   Past Psychiatric Medication Trials: Seroquel 300,  olanzapine 30 resolved mania,  (Improved  with Increase olanzapine to 30 mg PM and off the oxcarbazepine.) perphenazine, risperidone,  Latuda,   Abilify, Vraylar 1.5 mg for 1 month without benefit Lithium did great helped but SE afib, kidneys and prostate,    alprazolam,  Depakote, Trileptal 1800 balance problems  carbamazepine, lamotrigine caused nausea,  citalopram, sertraline 150,    Wellbutrin 300 anxiety and irritability and failed 10/2022,  Pramipexole 0.375 BID helped Benztropine confusion        1 prior psych hosp depression  Review of Systems:  Review of Systems  Constitutional:  Positive for fatigue.  HENT:         Clicking teeth   Eyes:  Negative for visual disturbance.  Gastrointestinal:  Positive for constipation.  Musculoskeletal:  Positive for arthralgias, back pain and gait problem.  Skin:  Negative for rash.       chronic  Neurological:  Positive for tremors and weakness.       Balance px longterm no worse RLS day and PM  Psychiatric/Behavioral:  Positive for dysphoric mood. Negative for agitation, behavioral problems, confusion, decreased concentration, hallucinations, self-injury, sleep disturbance and suicidal ideas. The patient is not nervous/anxious and is not hyperactive.     Medications: I have reviewed the patient's current medications.  Current Outpatient Medications  Medication Sig Dispense Refill   acetaminophen (TYLENOL) 500 MG tablet Take 1,000 mg by mouth every 6 (six) hours as needed for headache.     cholecalciferol (VITAMIN D) 1000 UNITS tablet Take 2,000 Units by mouth daily.     levothyroxine (SYNTHROID, LEVOTHROID) 25 MCG tablet Take 25 mcg by mouth daily before breakfast.     lithium carbonate 150 MG capsule TAKE 3 CAPSULES BY MOUTH AT BEDTIME 90 capsule 0   metoprolol succinate (TOPROL-XL) 25 MG 24 hr tablet Take 1 tablet (25 mg total) by mouth daily. 90 tablet 3   pantoprazole (PROTONIX) 40 MG tablet TAKE ONE TABLET BY MOUTH ONCE DAILY (Patient taking differently: Take 40 mg by mouth daily.) 30 tablet 11   rivaroxaban (XARELTO) 20 MG TABS tablet Take 20 mg by mouth daily after supper.      rosuvastatin (CRESTOR) 20 MG tablet Take 1 tablet (20 mg total) by mouth daily. 30 tablet 0   SUPER B COMPLEX/C PO Take 1 tablet by mouth daily.     modafinil (PROVIGIL) 100 MG tablet 1/2 tablet in the AM for 1 week then 1 tablet in the AM (Patient not taking: Reported on 06/24/2023) 30 tablet 0   pramipexole (MIRAPEX) 0.5 MG tablet 1 tablet twice daily for 7 days, then 1 and 1/2 tablets twice daily 90 tablet 1   No current facility-administered medications for this visit.    Medication Side  Effects: resolved  Allergies:  Allergies  Allergen Reactions   Amlodipine Other (See Comments)    Stopped due to kidney issues   Vicodin [Hydrocodone-Acetaminophen] Other (See Comments)    Patient is unsure of reaction    Past Medical History:  Diagnosis Date   Arthritis    Asthma    Atrial fibrillation (HCC) 01/12/2010   2D Echo EF=>55%   Bipolar disorder (HCC)    Chronic back pain    Depression    Dysrhythmia    AFib   GERD (gastroesophageal reflux disease)    History of colonic polyps    History of gout    Hyperlipidemia    Hypertension    Hypothyroidism    Morbid obesity (HCC)    OSA (obstructive sleep apnea)    AHI was 27.62hr RDI was 34.3 hr REM 0.00hr; had CPAP years ago but no longer uses.   Prostatitis    Tubular adenoma of colon 01/2016    Family History  Problem Relation Age of Onset   Diabetes Mother    Heart failure Mother    Hypertension Mother    Hypertension Father    Cancer Paternal Grandmother    Stroke Sister    Liver disease Brother    Vascular Disease Brother  Arthritis Brother    Colon cancer Neg Hx     Social History   Socioeconomic History   Marital status: Married    Spouse name: Not on file   Number of children: Not on file   Years of education: Not on file   Highest education level: Not on file  Occupational History   Not on file  Tobacco Use   Smoking status: Never   Smokeless tobacco: Never  Vaping Use   Vaping status: Never Used  Substance and Sexual Activity   Alcohol use: No    Alcohol/week: 0.0 standard drinks of alcohol   Drug use: No   Sexual activity: Yes    Birth control/protection: None  Other Topics Concern   Not on file  Social History Narrative   Not on file   Social Determinants of Health   Financial Resource Strain: Not on file  Food Insecurity: No Food Insecurity (11/24/2022)   Hunger Vital Sign    Worried About Running Out of Food in the Last Year: Never true    Ran Out of Food in the Last  Year: Never true  Transportation Needs: No Transportation Needs (11/24/2022)   PRAPARE - Administrator, Civil Service (Medical): No    Lack of Transportation (Non-Medical): No  Physical Activity: Not on file  Stress: Not on file  Social Connections: Not on file  Intimate Partner Violence: Patient Declined (11/24/2022)   Humiliation, Afraid, Rape, and Kick questionnaire    Fear of Current or Ex-Partner: Patient declined    Emotionally Abused: Patient declined    Physically Abused: Patient declined    Sexually Abused: Patient declined    Past Medical History, Surgical history, Social history, and Family history were reviewed and updated as appropriate.   Please see review of systems for further details on the patient's review from today.   Objective:   Physical Exam:  There were no vitals taken for this visit.  Physical Exam Constitutional:      General: He is not in acute distress. Musculoskeletal:        General: No deformity.  Neurological:     Mental Status: He is alert and oriented to person, place, and time.     Coordination: Coordination normal.     Gait: Gait normal.     Comments: Mild rocking motions legs resolved Mild lip licking and buccal movements  Psychiatric:        Attention and Perception: He is attentive. He does not perceive auditory hallucinations.        Mood and Affect: Mood is depressed. Mood is not anxious. Affect is blunt. Affect is not labile, angry or inappropriate.        Speech: Speech is not rapid and pressured or tangential.        Behavior: Behavior normal. Behavior is not agitated.        Thought Content: Thought content normal. Thought content is not delusional. Thought content does not include homicidal or suicidal ideation. Thought content does not include suicidal plan.        Cognition and Memory: Cognition normal.        Judgment: Judgment normal.     Comments: Insight intact. No auditory or visual hallucinations. No  delusions.  More depressed again.  Not real sad. Gait is a little slow but not marked. Lip smacking largely resolved.     Lab Review:     Component Value Date/Time   NA 144 05/26/2023 1623  K 4.6 05/26/2023 1623   CL 107 (H) 05/26/2023 1623   CO2 24 05/26/2023 1623   GLUCOSE 107 (H) 05/26/2023 1623   GLUCOSE 104 (H) 12/29/2022 0443   BUN 18 05/26/2023 1623   CREATININE 1.45 (H) 05/26/2023 1623   CALCIUM 9.5 05/26/2023 1623   PROT 7.0 11/24/2022 1252   PROT 6.6 12/28/2018 0941   ALBUMIN 3.9 11/24/2022 1252   ALBUMIN 4.5 12/28/2018 0941   AST 18 11/24/2022 1252   ALT 13 11/24/2022 1252   ALKPHOS 70 11/24/2022 1252   BILITOT 0.8 11/24/2022 1252   BILITOT 0.7 12/28/2018 0941   GFRNONAA >60 12/29/2022 0443   GFRAA 63 12/28/2018 0941       Component Value Date/Time   WBC 5.7 12/29/2022 0443   RBC 4.23 12/29/2022 0443   HGB 13.5 12/29/2022 0443   HGB 15.2 12/28/2018 0941   HCT 40.1 12/29/2022 0443   HCT 42.9 12/28/2018 0941   PLT 120 (L) 12/29/2022 0443   PLT 186 12/28/2018 0941   MCV 94.8 12/29/2022 0443   MCV 89 12/28/2018 0941   MCH 31.9 12/29/2022 0443   MCHC 33.7 12/29/2022 0443   RDW 13.2 12/29/2022 0443   RDW 12.9 12/28/2018 0941   LYMPHSABS 0.7 12/29/2022 0443   LYMPHSABS 1.1 12/28/2018 0941   MONOABS 0.5 12/29/2022 0443   EOSABS 0.2 12/29/2022 0443   EOSABS 0.1 12/28/2018 0941   BASOSABS 0.0 12/29/2022 0443   BASOSABS 0.0 12/28/2018 0941    Lithium Lvl  Date Value Ref Range Status  04/22/2023 0.4 (L) 0.5 - 1.2 mmol/L Final    Comment:    A concentration of 0.5-0.8 mmol/L is advised for long-term use; concentrations of up to 1.2 mmol/L may be necessary during acute treatment.                                  Detection Limit = 0.1                           <0.1 indicates None Detected      No results found for: "PHENYTOIN", "PHENOBARB", "VALPROATE", "CBMZ"   .res Assessment: Plan:    Casey [name] was seen today for follow-up, depression and  medication reaction.  Diagnoses and all orders for this visit:  Bipolar affective disorder, currently depressed, moderate (HCC) -     pramipexole (MIRAPEX) 0.5 MG tablet; 1 tablet twice daily for 7 days, then 1 and 1/2 tablets twice daily  Generalized anxiety disorder  History of obstructive sleep apnea  Hypersomnolence disorder, acute, moderate  Obstructive sleep apnea syndrome  Lithium use     30 min face to face time with patient was spent on counseling and coordination of care. We discussed Dx bipolar at 77 yo.  Bipolar depression has recurred again like last year but so far is not as severe.  Patient is very complicated because of weakness and medical problems.    History pseudodementia with depression.   But depressed again. Hypersomnolence.   Prednisone triggered some mania in 2023 and persisted off prednisone.    Very Mild lip smacking consistent with TD but appears better.  No change in energy, dep, hypersomnolence with switch from olanzapine to Vraylar.  No RLS  and sleeping too much still, but a little better. RLS better off olanzapine.   If still a problem with sleepiness DT multiple med failures consider modafinil to  manage.  Cr is normal.  Failed multiple alternatives.  Disc risks lithium but it is what worked best.  Will try LED DT medical problems including chronic afib.  lithium 450 HS.  Lithium 0.4.  04/22/23. CR 1.45 on 05/26/23.  Will need FU Repeat labs after new year  Option modafinil if not better in a couple of weeks.  Normal Qtc onEKG since here  For TRD increase pramipexole to 0.5 mg BID and then to previous dose of 0.75 mg BID Disc SE including manic risk  Follow-up 4 weeks  Meredith Staggers, MD, DFAPA   Please see After Visit Summary for patient specific instructions.   Future Appointments  Date Time Provider Department Center  07/29/2023  9:30 AM Cottle, Steva Ready., MD [facility] None  08/20/2023  8:40 AM Croitoru, Rachelle Hora, MD [facility] None       No orders of the defined types were placed in this encounter.      -------------------------------

## 2023-06-24 NOTE — Patient Instructions (Addendum)
Pramipexole 0.5 mg tablet 1 tablet twice daily for 7 days, then 1 and 1/2 tablets twice daily Check lithium level and kidney function blood test.

## 2023-06-25 ENCOUNTER — Telehealth: Payer: Self-pay

## 2023-06-25 NOTE — Telephone Encounter (Signed)
Reviewed results with patient's wife (DPR) who verbalized understanding.   They wish to proceed with LAAO on 08/14/2023. Pre-procedure visit scheduled 07/23/2023. She was grateful for call and agreed with plan.

## 2023-06-25 NOTE — Telephone Encounter (Signed)
-----   Message from Nobie Putnam sent at 06/19/2023  9:13 PM EST ----- Molli Knock to proceed with scheduling for implant.

## 2023-06-27 ENCOUNTER — Telehealth: Payer: Self-pay

## 2023-06-30 NOTE — Telephone Encounter (Signed)
Spoke with the patient's wife. New insurance info for 2025: [employer]  2025 [employer] H[employer] Member ID: [id number] Health plan: [id number]

## 2023-07-01 ENCOUNTER — Other Ambulatory Visit: Payer: Self-pay

## 2023-07-01 ENCOUNTER — Telehealth: Payer: Self-pay

## 2023-07-01 DIAGNOSIS — I219 Acute myocardial infarction, unspecified: Secondary | ICD-10-CM | POA: Diagnosis not present

## 2023-07-01 DIAGNOSIS — I4821 Permanent atrial fibrillation: Secondary | ICD-10-CM

## 2023-07-01 DIAGNOSIS — E119 Type 2 diabetes mellitus without complications: Secondary | ICD-10-CM | POA: Diagnosis not present

## 2023-07-01 DIAGNOSIS — Z8719 Personal history of other diseases of the digestive system: Secondary | ICD-10-CM

## 2023-07-01 DIAGNOSIS — I639 Cerebral infarction, unspecified: Secondary | ICD-10-CM

## 2023-07-01 DIAGNOSIS — N183 Chronic kidney disease, stage 3 unspecified: Secondary | ICD-10-CM | POA: Diagnosis not present

## 2023-07-10 ENCOUNTER — Other Ambulatory Visit: Payer: Self-pay | Admitting: Psychiatry

## 2023-07-10 DIAGNOSIS — F3132 Bipolar disorder, current episode depressed, moderate: Secondary | ICD-10-CM

## 2023-07-21 NOTE — Progress Notes (Addendum)
 07/22/2023 10:47 AM   Casey [name] Sep 05, 1945 [id number]  Referring provider: [name], MD [address] [place],  [place] [address]  No chief complaint on file.   HPI: 78 yo male sent by Dr Marvine for elevated PSA, recently 6.5 on 12.18.2024. As of yet no other old data available.  He has been seen previously by Dr. [name], the last in 2020.  He has a history of recurrent urinary tract infections as well as meatal stenosis.  The patient apparently self dilates once in a while.  He currently denies significant lower urinary tract symptoms.  Apparently, he was treated for what sounds like epididymitis 2 to 3 months ago at an urgent care center here in [place].  Currently denies gross hematuria or dysuria.  He denies history of elevated PSA.  Last PSA data I have from him when he saw Dr. Seltzer was 2.1 in 2020.  His brother does have a history of prostate cancer.    PMH: Past Medical History:  Diagnosis Date   Arthritis    Asthma    Atrial fibrillation (HCC) 01/12/2010   2D Echo EF=>55%   Bipolar disorder (HCC)    Chronic back pain    Depression    Dysrhythmia    AFib   GERD (gastroesophageal reflux disease)    History of colonic polyps    History of gout    Hyperlipidemia    Hypertension    Hypothyroidism    Morbid obesity (HCC)    OSA (obstructive sleep apnea)    AHI was 27.62hr RDI was 34.3 hr REM 0.00hr; had CPAP years ago but no longer uses.   Prostatitis    Tubular adenoma of colon 01/2016    Surgical History: Past Surgical History:  Procedure Laterality Date   APPENDECTOMY  1959   asthma     CATARACT EXTRACTION Bilateral 1995   COLONOSCOPY  2011   RMR: left-sided diverticula, minimal rectal friability. No polyps. Repeat 5 years.   COLONOSCOPY WITH PROPOFOL  N/A 01/15/2016   Dr.Rourk- diverticulosis in the entire examined colon, multiple rectal and colonic polyps, internal hemorrhoids. bx= tubular adenomas and hyperplastic polyps.     COLONOSCOPY WITH PROPOFOL  N/A 03/13/2022   Procedure: COLONOSCOPY WITH PROPOFOL ;  Surgeon: [name], MD;  [facility];  Service: Endoscopy;  Laterality: N/A;  11:00am   HAND / FINGER LESION EXCISION Right    IR THORACENTESIS ASP PLEURAL SPACE W/IMG GUIDE  03/01/2019   IR THORACENTESIS ASP PLEURAL SPACE W/IMG GUIDE  03/31/2019   POLYPECTOMY  01/15/2016   Procedure: POLYPECTOMY;  Surgeon: Lamar HERO Shaaron, MD;  Location: [facility];  Service: Endoscopy;;   POLYPECTOMY  03/13/2022   Procedure: POLYPECTOMY;  Surgeon: [name], MD;  Location: [facility];  Service: Endoscopy;;   right shoulder surgery     TONSILLECTOMY  1955    Home Medications:  Allergies as of 07/22/2023       Reactions   Amlodipine Other (See Comments)   Stopped due to kidney issues   Vicodin [hydrocodone -acetaminophen ] Other (See Comments)   Patient is unsure of reaction        Medication List        Accurate as of July 21, 2023 10:47 AM. If you have any questions, ask your nurse or doctor.          acetaminophen  500 MG tablet Commonly known as: TYLENOL  Take 1,000 mg by mouth every 6 (six) hours as needed for headache.  cholecalciferol 1000 units tablet Commonly known as: VITAMIN D Take 2,000 Units by mouth daily.   levothyroxine  25 MCG tablet Commonly known as: SYNTHROID  Take 25 mcg by mouth daily before breakfast.   lithium  carbonate 150 MG capsule TAKE 3 CAPSULES BY MOUTH AT BEDTIME   metoprolol  succinate 25 MG 24 hr tablet Commonly known as: TOPROL -XL Take 1 tablet (25 mg total) by mouth daily.   modafinil  100 MG tablet Commonly known as: PROVIGIL  1/2 tablet in the AM for 1 week then 1 tablet in the AM   pantoprazole  40 MG tablet Commonly known as: PROTONIX  TAKE ONE TABLET BY MOUTH ONCE DAILY   pramipexole  0.5 MG tablet Commonly known as: MIRAPEX  1 tablet twice daily for 7 days, then 1 and 1/2 tablets twice daily   rivaroxaban  20 MG Tabs tablet Commonly  known as: XARELTO  Take 20 mg by mouth daily after supper.   rosuvastatin  20 MG tablet Commonly known as: CRESTOR  Take 1 tablet (20 mg total) by mouth daily.   SUPER B COMPLEX/C PO Take 1 tablet by mouth daily.        Allergies:  Allergies  Allergen Reactions   Amlodipine Other (See Comments)    Stopped due to kidney issues   Vicodin [Hydrocodone -Acetaminophen ] Other (See Comments)    Patient is unsure of reaction    Family History: Family History  Problem Relation Age of Onset   Diabetes Mother    Heart failure Mother    Hypertension Mother    Hypertension Father    Cancer Paternal Grandmother    Stroke Sister    Liver disease Brother    Vascular Disease Brother    Arthritis Brother    Colon cancer Neg Hx     Social History:  reports that he has never smoked. He has never used smokeless tobacco. He reports that he does not drink alcohol and does not use drugs.  ROS: All other review of systems were reviewed and are negative except what is noted above in HPI  Physical Exam: There were no vitals taken for this visit.  Constitutional:  Alert and oriented, No acute distress. HEENT: [place] AT, moist mucus membranes.  Trachea midline, no masses. Cardiovascular: No clubbing, cyanosis, or edema. Respiratory: Normal respiratory effort, no increased work of breathing. GI: There are no inguinal hernias GU: Phallus circumcised.  Moderate meatal stenosis.  Testicles normal bilaterally.  Epididymal structures normal.  Normal anal sphincter tone.  Prostate size 40 g, nonnodular, nontender. Lymph: No cervical or inguinal lymphadenopathy. Skin: No rashes, bruises or suspicious lesions. Neurologic: Grossly intact, no focal deficits, moving all 4 extremities. Psychiatric: Normal mood and affect.  Laboratory Data: Lab Results  Component Value Date   WBC 5.7 12/29/2022   HGB 13.5 12/29/2022   HCT 40.1 12/29/2022   MCV 94.8 12/29/2022   PLT 120 (L) 12/29/2022    Lab Results   Component Value Date   CREATININE 1.45 (H) 05/26/2023    No results found for: PSA  No results found for: TESTOSTERONE  Lab Results  Component Value Date   HGBA1C 5.2 11/24/2022    Urinalysis    Component Value Date/Time   COLORURINE YELLOW 11/24/2022 1535   APPEARANCEUR HAZY (A) 11/24/2022 1535   LABSPEC 1.009 11/24/2022 1535   PHURINE 5.0 11/24/2022 1535   GLUCOSEU NEGATIVE 11/24/2022 1535   HGBUR NEGATIVE 11/24/2022 1535   BILIRUBINUR NEGATIVE 11/24/2022 1535   KETONESUR NEGATIVE 11/24/2022 1535   PROTEINUR NEGATIVE 11/24/2022 1535   NITRITE NEGATIVE 11/24/2022  1535   LEUKOCYTESUR LARGE (A) 11/24/2022 1535    Lab Results  Component Value Date   BACTERIA RARE (A) 11/24/2022    Pertinent Imaging:    3 ppgs of old records reviewed--only PSA available is that in HPI.   Assessment: 1.  Elevated PSA with recent  value 6.5.  He does have a baseline 4 years ago of 2.1 but also has what appears to be possible UTI  2.  Microscopic hematuria/possible UTI  3.  Meatal stenosis   Plan: 1.  I did tell him that we will eventually need to recheck his PSA.  I will think he needs evaluation now as he does have polyuria/microscopic hematuria  2.  Urine is sent for culture.  No antibiotics yet  3.  If antibiotic administered, I will have him come back a few weeks after that to check urine as well as repeat PSA  4.  If culture negative, he will first need an evaluation for microscopic hematuria then evaluation of his PSA  5.  I did recommend that he self dilate every 2 to 3 weeks. There are no diagnoses linked to this encounter.  No follow-ups on file.  Garnette CHRISTELLA Shack, MD  [facility] [place]  Addendum: I have received 2 more PSA levels from [facility]-- PSA on 06/28/2022 was 3.9 PSA on 04/05/2020 was 3.9  Will wait for repeat PSA

## 2023-07-22 ENCOUNTER — Ambulatory Visit (INDEPENDENT_AMBULATORY_CARE_PROVIDER_SITE_OTHER): Payer: HMO | Admitting: Urology

## 2023-07-22 VITALS — BP 128/55 | HR 62

## 2023-07-22 DIAGNOSIS — N3001 Acute cystitis with hematuria: Secondary | ICD-10-CM

## 2023-07-22 DIAGNOSIS — N35010 Post-traumatic urethral stricture, male, meatal: Secondary | ICD-10-CM | POA: Diagnosis not present

## 2023-07-22 DIAGNOSIS — R972 Elevated prostate specific antigen [PSA]: Secondary | ICD-10-CM

## 2023-07-22 DIAGNOSIS — R3129 Other microscopic hematuria: Secondary | ICD-10-CM

## 2023-07-22 LAB — URINALYSIS, ROUTINE W REFLEX MICROSCOPIC
Bilirubin, UA: NEGATIVE
Glucose, UA: NEGATIVE
Ketones, UA: NEGATIVE
Nitrite, UA: NEGATIVE
Specific Gravity, UA: 1.025 (ref 1.005–1.030)
Urobilinogen, Ur: 1 mg/dL (ref 0.2–1.0)
pH, UA: 6 (ref 5.0–7.5)

## 2023-07-22 LAB — MICROSCOPIC EXAMINATION
Epithelial Cells (non renal): >10 /[HPF] — AB (ref 0–10)
RBC, Urine: >30 /[HPF] — AB (ref 0–2)

## 2023-07-23 ENCOUNTER — Ambulatory Visit: Payer: HMO | Attending: Physician Assistant | Admitting: Physician Assistant

## 2023-07-23 VITALS — BP 138/70 | HR 74 | Resp 16 | Ht 72.0 in | Wt 206.0 lb

## 2023-07-23 DIAGNOSIS — E785 Hyperlipidemia, unspecified: Secondary | ICD-10-CM

## 2023-07-23 DIAGNOSIS — N1831 Chronic kidney disease, stage 3a: Secondary | ICD-10-CM | POA: Diagnosis not present

## 2023-07-23 DIAGNOSIS — R911 Solitary pulmonary nodule: Secondary | ICD-10-CM

## 2023-07-23 DIAGNOSIS — I4821 Permanent atrial fibrillation: Secondary | ICD-10-CM | POA: Diagnosis not present

## 2023-07-23 DIAGNOSIS — F3131 Bipolar disorder, current episode depressed, mild: Secondary | ICD-10-CM

## 2023-07-23 DIAGNOSIS — G4733 Obstructive sleep apnea (adult) (pediatric): Secondary | ICD-10-CM | POA: Diagnosis not present

## 2023-07-23 DIAGNOSIS — Z8719 Personal history of other diseases of the digestive system: Secondary | ICD-10-CM

## 2023-07-23 NOTE — Patient Instructions (Addendum)
 Medication Instructions:  Your physician recommends that you continue on your current medications as directed. Please refer to the Current Medication list given to you today.  *If you need a refill on your cardiac medications before your next appointment, please call your pharmacy*   Lab Work: BMET, CBC - TODAY  If you have labs (blood work) drawn today and your tests are completely normal, you will receive your results only by: MyChart Message (if you have MyChart) OR A paper copy in the mail If you have any lab test that is abnormal or we need to change your treatment, we will call you to review the results.   Testing/Procedures: None ordered    Follow-Up: Follow up will be scheduled during your hospital stay   Other Instructions

## 2023-07-23 NOTE — Progress Notes (Addendum)
 [facility]   MULTIDISCIPLINARY HEART VALVE CLINIC                                     Cardiology Office Note:    Date:  07/30/2023   ID:  Casey [name], DOB 1946/03/06, MRN [id number]  PCP:  [name], MD  [facility] Cardiologist:  Jerel Balding, MD  [facility] Electrophysiologist:  Fonda Kitty, MD   Referring MD: [name], MD   Pre LAAO visit - Watchman scheduled for 08/14/23 with Dr. Kitty  History of Present Illness:    Casey [name] is a 78 y.o. male with a hx of CVA, OSA, HTN, bipolar disorder, GI bleeding and permanent atrial fibrillation who presents to clinic for pre LAAO visit.  He reportedly had a stroke while off/noncompliant with oral anticoagulation. He admits to having difficulty maintaining adherence to oral anticoagulation, which he attributes to difficulty remembering his various regimens of numerous medications and frequent medication changes. His compliance also appears to change with his mood related to bipolar disorder. He is also leery of taking his anticoagulation given that he has had bleeding problems in the past. He had a GI bleed requiring transfusion following a polypectomy. He was referred to Dr. Kitty for consideration of LAAO with Watchman. Cardiac CT 06/30/23 showed Large chicken wing appendage suitable for a 35 mm Wathcman FLX device. Calcium  score was 23.2 isolated to LAD which is 17 th percentile for age/sex.  Seen by urologist yesterday and noted to have elevated PSA but possible UTI. Culture sent with plans to start Abx if positive.   Today the patient presents to clinic for follow up. Here with his wife. No s/s UTI. No CP or SOB. No LE edema, orthopnea or PND. No dizziness or syncope. No blood in stool or urine. No palpitations.    Past Medical History:  Diagnosis Date   Arthritis    Asthma    Atrial fibrillation (HCC) 01/12/2010   2D Echo EF=>55%   Bipolar disorder (HCC)    Chronic back pain    Depression     Dysrhythmia    AFib   GERD (gastroesophageal reflux disease)    History of colonic polyps    History of gout    Hyperlipidemia    Hypertension    Hypothyroidism    Morbid obesity (HCC)    OSA (obstructive sleep apnea)    AHI was 27.62hr RDI was 34.3 hr REM 0.00hr; had CPAP years ago but no longer uses.   Prostatitis    Tubular adenoma of colon 01/2016     Current Medications: Current Meds  Medication Sig   acetaminophen  (TYLENOL ) 500 MG tablet Take 1,000 mg by mouth every 6 (six) hours as needed for headache.   cholecalciferol (VITAMIN D) 1000 UNITS tablet Take 2,000 Units by mouth daily.   levothyroxine  (SYNTHROID , LEVOTHROID) 25 MCG tablet Take 25 mcg by mouth daily before breakfast.   lithium  carbonate 150 MG capsule TAKE 3 CAPSULES BY MOUTH AT BEDTIME   metoprolol  succinate (TOPROL -XL) 25 MG 24 hr tablet Take 1 tablet (25 mg total) by mouth daily.   modafinil  (PROVIGIL ) 100 MG tablet 1/2 tablet in the AM for 1 week then 1 tablet in the AM   pantoprazole  (PROTONIX ) 40 MG tablet TAKE ONE TABLET BY MOUTH ONCE DAILY (Patient taking differently: Take 40 mg by mouth daily.)   rivaroxaban  (XARELTO ) 20  MG TABS tablet Take 20 mg by mouth daily after supper.    rosuvastatin  (CRESTOR ) 20 MG tablet Take 1 tablet (20 mg total) by mouth daily.   SUPER B COMPLEX/C PO Take 1 tablet by mouth daily.   [DISCONTINUED] pramipexole  (MIRAPEX ) 0.5 MG tablet 1 tablet twice daily for 7 days, then 1 and 1/2 tablets twice daily      ROS:   Please see the history of present illness.    All other systems reviewed and are negative.  EKGs   EKG Interpretation Date/Time:  Wednesday July 23 2023 15:30:37 EST Ventricular Rate:  88 PR Interval:    QRS Duration:  76 QT Interval:  366 QTC Calculation: 442 R Axis:   32  Text Interpretation: Atrial fibrillation with premature ventricular or aberrantly conducted complexes Confirmed by Sebastian Collar [phone]) on 07/23/2023 3:44:52 PM   Risk  Assessment/Calculations:    CHA2DS2-VASc Score = 4   This indicates a 4.8% annual risk of stroke. The patient's score is based upon: CHF History: 0 HTN History: 0 Diabetes History: 0 Stroke History: 2 Vascular Disease History: 0 Age Score: 2 Gender Score: 0   HAS-BLED score 3 Hypertension No  Abnormal renal and liver function (Dialysis, transplant, Cr >2.26 mg/dL /Cirrhosis or Bilirubin >2x Normal or AST/ALT/AP >3x Normal) No  Stroke Yes  Bleeding Yes  Labile INR (Unstable/high INR) No  Elderly (>65) Yes  Drugs or alcohol (>= 8 drinks/week, anti-plt or NSAID) No   Physical Exam:    VS:  BP 138/70 (BP Location: Right Arm, Patient Position: Sitting, Cuff Size: Normal)   Pulse 74   Resp 16   Ht 6' (1.829 m)   Wt 206 lb (93.4 kg)   SpO2 95%   BMI 27.94 kg/m     Wt Readings from Last 3 Encounters:  07/23/23 206 lb (93.4 kg)  05/26/23 209 lb 12.8 oz (95.2 kg)  04/23/23 213 lb 3.2 oz (96.7 kg)     GEN: Well nourished, well developed in no acute distress NECK: No JVD CARDIAC: irreg irreg, no murmurs, rubs, gallops RESPIRATORY:  Clear to auscultation without rales, wheezing or rhonchi  ABDOMEN: Soft, non-tender, non-distended EXTREMITIES:  No edema; No deformity.   ASSESSMENT:    1. Permanent atrial fibrillation (HCC)   2. H/O: GI bleed   3. Hyperlipidemia, unspecified hyperlipidemia type   4. Bipolar affective disorder, currently depressed, mild (HCC)   5. OSA (obstructive sleep apnea)   6. Pulmonary nodule   7. Stage 3a chronic kidney disease (HCC)    PLAN:    In order of problems listed above:  Permanent atrial fibrillation: not felt to be a long term anticoagulation candidate secondary to history of nonadherence and bleeding. Currently tolerating Xarelto  20mg  daily with no issues. He is scheduled for Watchman with Dr. Kennyth on 08/14/23. EKG/CBC/BMET today. Instruction letter and soap given.   HLD: continue Crestor  20mg  daily.   Bipolar disorder: continue  lithium    OSA: continue CPAP  CKD stage IIIa: creat 1.45 on most recent labs. Recheck BMET today  __________________________  [id number] 07/30/23:   Pulmonary nodule: the radiology overread of the cardiac CT reported a left lower lobe mass and recommended a contrast-enhanced CT of the chest with for further evaluation. Mrs Caradine said he has a long history of issues with his left lung and was previously told he had a mass and it ended up being a pleural effusion requiring multiple thoracenteses. I do see a record of this in 2020.  He was seen by Dr. Leita Gaskins MD with pulmonology at the time. I reviewed previous imaging including a CT angio with contrast in 12/2022 which noted extensive remote posttraumatic changes in the left hemithorax probable chronic small left pleural effusion and extensive pleuroparenchymal scarring, including what appears to be a large area of rounded atelectasis in the left upper lobe. Follow-up noncontrast chest CT is recommended in 3-6 months to ensure the stability of this finding and establish a baseline for future follow-up imaging.  The patient is worried about proceeding with CT scanning with contrast given renal dysfunction. I reassured her that his renal impairment is not severe and he should be just fine with a contrast CT. I have scheduled this for [facility] 08/14/23 @ 7pm. Unfortunately, we will have to postpone his upcoming Watchman until this is all sorted out.    Total time spent with patient was over 40 minutes which included evaluating patient, reviewing record and coordinating care. Face to face time >50%.    Medication Adjustments/Labs and Tests Ordered: Current medicines are reviewed at length with the patient today.  Concerns regarding medicines are outlined above.  Orders Placed This Encounter  Procedures   CT Chest W Contrast   Basic metabolic panel   CBC   EKG 12-Lead   No orders of the defined types were placed in this encounter.   Patient  Instructions  Medication Instructions:  Your physician recommends that you continue on your current medications as directed. Please refer to the Current Medication list given to you today.  *If you need a refill on your cardiac medications before your next appointment, please call your pharmacy*   Lab Work: BMET, CBC - TODAY  If you have labs (blood work) drawn today and your tests are completely normal, you will receive your results only by: MyChart Message (if you have MyChart) OR A paper copy in the mail If you have any lab test that is abnormal or we need to change your treatment, we will call you to review the results.   Testing/Procedures: None ordered    Follow-Up: Follow up will be scheduled during your hospital stay   Other Instructions         Signed, Lamarr Hummer, PA-C  07/30/2023 4:22 PM    [facility]

## 2023-07-24 LAB — BASIC METABOLIC PANEL
BUN/Creatinine Ratio: 14 (ref 10–24)
BUN: 20 mg/dL (ref 8–27)
CO2: 22 mmol/L (ref 20–29)
Calcium: 9.3 mg/dL (ref 8.6–10.2)
Chloride: 108 mmol/L — ABNORMAL HIGH (ref 96–106)
Creatinine, Ser: 1.46 mg/dL — ABNORMAL HIGH (ref 0.76–1.27)
Glucose: 92 mg/dL (ref 70–99)
Potassium: 4.3 mmol/L (ref 3.5–5.2)
Sodium: 146 mmol/L — ABNORMAL HIGH (ref 134–144)
eGFR: 50 mL/min/{1.73_m2} — ABNORMAL LOW (ref >59)

## 2023-07-24 LAB — URINE CULTURE

## 2023-07-24 LAB — CBC
Hematocrit: 40.3 % (ref 37.5–51.0)
Hemoglobin: 13.4 g/dL (ref 13.0–17.7)
MCH: 32.7 pg (ref 26.6–33.0)
MCHC: 33.3 g/dL (ref 31.5–35.7)
MCV: 98 fL — ABNORMAL HIGH (ref 79–97)
Platelets: 197 10*3/uL (ref 150–450)
RBC: 4.1 x10E6/uL — ABNORMAL LOW (ref 4.14–5.80)
RDW: 12.5 % (ref 11.6–15.4)
WBC: 7.7 10*3/uL (ref 3.4–10.8)

## 2023-07-25 ENCOUNTER — Other Ambulatory Visit: Payer: Self-pay | Admitting: Urology

## 2023-07-25 DIAGNOSIS — R972 Elevated prostate specific antigen [PSA]: Secondary | ICD-10-CM

## 2023-07-25 DIAGNOSIS — N3001 Acute cystitis with hematuria: Secondary | ICD-10-CM

## 2023-07-25 MED ORDER — AMOXICILLIN 875 MG PO TABS: 875.0000 mg | ORAL_TABLET | Freq: Two times a day (BID) | ORAL | 0 refills | Status: DC

## 2023-07-28 ENCOUNTER — Telehealth: Payer: Self-pay | Admitting: Urology

## 2023-07-28 NOTE — Telephone Encounter (Signed)
 Called Pt and left a VM stating that there's no need for a f/u appt but if they have any question to give [place] a call

## 2023-07-28 NOTE — Telephone Encounter (Signed)
 Says antibiotic was called in but never received a call. Also wants to know if he needs a follow up appt.

## 2023-07-29 ENCOUNTER — Encounter: Payer: Self-pay | Admitting: Psychiatry

## 2023-07-29 ENCOUNTER — Ambulatory Visit: Payer: HMO | Admitting: Psychiatry

## 2023-07-29 DIAGNOSIS — G4710 Hypersomnia, unspecified: Secondary | ICD-10-CM | POA: Diagnosis not present

## 2023-07-29 DIAGNOSIS — G4733 Obstructive sleep apnea (adult) (pediatric): Secondary | ICD-10-CM

## 2023-07-29 DIAGNOSIS — F411 Generalized anxiety disorder: Secondary | ICD-10-CM | POA: Diagnosis not present

## 2023-07-29 DIAGNOSIS — Z79899 Other long term (current) drug therapy: Secondary | ICD-10-CM

## 2023-07-29 DIAGNOSIS — F3132 Bipolar disorder, current episode depressed, moderate: Secondary | ICD-10-CM | POA: Diagnosis not present

## 2023-07-29 MED ORDER — PRAMIPEXOLE DIHYDROCHLORIDE 0.75 MG PO TABS: 0.7500 mg | ORAL_TABLET | Freq: Two times a day (BID) | ORAL | 0 refills | Status: DC

## 2023-07-29 NOTE — Progress Notes (Addendum)
 Casey [name] [id number] 1946/01/30 78 y.o.    Subjective:   Patient ID:  Casey [name] is a 78 y.o. (DOB 1946-05-30) male.  Chief Complaint:  Chief Complaint  Patient presents with   Follow-up   Depression    HPI  Casey [name] presents for follow-up of bipolar disorder and anxiety and history of confusion  At visit  Dec 02, 2018.  He was having confusion and benztropine  was stopped to see if that was the cause.  Also it was having balance issues and therefore Trileptal  was switched all to nighttime 1200 mg nightly. Confusion much better per both of them with DC benztropine .  Balance better with change Trileptal .   visit was January 13, 2019 and he was doing well as noted above and he was encouraged not to make further med changes at that time.  visit April 13, 2019.  He was doing better with regard to depression than he had in quite some time on the Wellbutrin  and no meds were changed.  He was having health problems with pulmonary effusion and fluid removed twice related to fall in January 2020.  seen September 13, 2019.  For residual depression the following decisions were made: Balance problem much improved with dosing Trileptal  all in the evening. Depression under partial control & wife wants it to be better per his wife.  No mood swings.   Increase bupropion  XL to 2 of the 150 mg tablets in the morning until the current bottle is gone. Then new RX will be 1 of the 300 mg tablets each morning.  November 08, 2019 appointment the following is noted:  Seen with wife, Rock. Small amount of improvement with change.  Anxiety got worse.  Notices when he goes out.  Worries about social and political things.  Wife keeps him active at home.  Allerigies bother him..  No sig caffeine except tea when goes out.  Wife sees him as a little more active and initiative.  More productive.  Started some cooking.  Getting out and up better.  A little more talkative.   He wants to be hyper but we know  what happens. Wife Rock thinks   But for the last 2 years he's done nothing and laid around all the time.  He's a lot more active now. Big improvement. Went to church first time in 2 years. Plan:  For TRD, Pramipexole  0.25 mg tablet 1/2 twice daily for 1 week then 1 twice daily.  12/20/2019 appointment with the following noted:  Seen with wife. A little better motivated to get out but not to get in crowds.  Wife agrees more motivated and went to church once alone without pressure.  Wife says he's more talkative and much  Better interest.  Still not motivated to do things like mow the grass.  Others's have seen a difference. Concentration and memory are improved but not normal. No Se pramipexole .  Getting out of bed earlier and better now.. Wife admisters meds bc earlier he messsed them up. Plan: Wife has seen a big improvement therefore Partial improvement is clear from this so increase Pramipexole  0.375 mg tablet  twice daily  Anxiety is not fully managed.  Partly related to depression. Tremor is manageable.    02/15/20 appt with the following noted:  Send with wife Rock Doing good.  Increased pramipexole  as indicated.  Tolerating meds Wellbutrin  XL 300 mg AM & Trileptal  1200 mg HS with it. Much better with depression.  Stays up  more and speinding less time in bed.   Wife agrees he's much better and handling things better. 2 1/2 yo 4th cousin girl drowned in pond lately.  Big family. Wife pleased he handled it better. SE constipation ? Related. History Linzess  but diarrhea. He's not highly motivated but it is better.  Depression is much improved.  Had been depressed for years. Plan no med changes  06/13/2020 appointment with the following noted: Pretty good but cardiologist noted today increased pulse needing treatment.  Uncontrolled afib even after ablation. Hard to tell about depression bc of fatigue Dt heart problems. Wife said he did well Thanksgiving and participated more in  conversation per wife. Plan: no med changes  09/06/2020 appointment with following noted: Remains on Wellbutrin  XL 300 mg every morning, ginkgo biloba 40 mg twice daily, oxcarbazepine  1200 mg nightly, pramipexole  0.75 mg twice daily. Less sig health concerns. He thinks mood has been pretty good.  Consistent with meds. Wife thinks he stays tired and sleepy and wonders if can give him a stimulant.   Sleep good.  Will nap.  Doesn't bother him.  Walks dog 20 min in AM.  Not much initiative per wife.  Not depressed but when depressed he stayed in the bed lasting a couple of years.  History of OSA but lost weight and doesn't think he has apnea anymore.  No CPAP. Chronic afib. Not much caffeine. Plan: Wife& patient has seen a big improvement with Pramipexole  0.375 mg tablet  twice daily They ask consideration be given to the following: If his energy gets better by treating his tachycardia they would like to consider reducing the pramipexole  or possibly eliminating it at follow-up.   For energy trial reduction pramipexole  to 0.25 mg BID.  If gets more depressed call. Alternative of modafinil  100  12/04/2020 appointment with the following noted: Energy is better with awakening earlier at 8 and doing more and getting out more.  Going to bed later with 8 hour  Sleep. Mood fluctuates with more reacting to wife mainly.  More outspoken.  Per W he has to vent.  She thinks he is too focused on politics and issues.   Plan: Depression under control.  No mood swings.   Continue bupropion  XL 300 mg tablets each morning. Continue Trileptal  600 mg 2 at HS. No higher DT anxiety risk and no traditional stimulants DT afib.  Wife & patient has seen a big improvement with Pramipexole  and able to reduce to  0.25 mg tablet  twice daily  02/20/2021 appointment with the following noted:  seen with wife Prednisone  made him real hyper.  Been off of it for at least a couple weeks.  Hyperverbal and hyperactive.  Hip pain  interferes with sleep.  Sees doctor tomorrow. Average 3-4 hours at night and no naps bc busy.  No excessive spending but more than usual.  Denies appetite disturbance.  Patient reports that energy and motivation have been reduced.  Patient has difficulty with concentration and memory.  Patient denies any suicidal ideation.  W had MI in June. A/P: mild mania Wean Wellbutrin  over 2 weeks. Continue Trileptal  600 mg 2 at HS. Wife & patient has seen a big improvement with Pramipexole  and able to reduce to  0.25 mg tablet  twice daily. Continue it unless mania does not resolve with less bupropion .  03/26/2021 phone call: Rock stated Casey [name] has slightly improved since last week.He has not been able to sleep and was up at 2 am texting people in  a disoriented state.His pcp put him on Ambien 10 mg and this has helped him sleep.However,linda stated he is still working him self too much.She said he is jittery and has to have something to do at all times.He does not want to slow down or get rest.She thinks he needs to cutback on the Mirapex  as well. MD response:  I agree with his wife about reducing Mirapex  from 1 tablet twice a day to half tablet twice a day for 1 week and if his symptoms are not resolved and stop the Mirapex .  04/18/2021 phone call with wife and MD: Patient is still manic after stopping Wellbutrin  and pramipexole .  Wife's concern is hard to get him to settle down at night.  Reviewed with wife prior psychiatric medications which are multiple.  Typically one would use an antipsychotic in this case but she does not think that they worked in the past.  She says he has been on higher doses of Trileptal  and tolerated it. Increase Trileptal  to 600 mg every morning and 1200 mg nightly for mania.  Keep scheduled appointment.  05/02/2021 wife reports patient still manic: We cannot go higher in the Trileptal  because of balance issues.  Therefore olanzapine  10 mg nightly was started and we discussed the option  of hospitalization.  We will try to get the patient in urgently to an appointment.  05/07/2021 appointment with the following noted: seen with wife, Rock Not doing too well.  Trouble getting out of bed.  Wife says he's still up in middle of the night but he says he's sleeping better.  Sleep in recliner downstairs. She says he's still manic with hyperactive, hyper graphia but won't show her what he's writing. On olanzapine   No changes off Wellbutrin  and pramipexole . Apparently more balance problems with increase Trileptal  to 1800 mg daily.  Dropped back to 1200 mg HS. Admits to some confusion.  Has pulled off labels on bottle per wife. She thinks he's averaging 4-6 hours of sleep and a little better than it was. Wife said he started cussing and he has not done that in the past. Other episodes of inappropriate decision making.  He recognizes he's manic but can't control himself.  Hyperverbal, pressured.  In past had speech problems with mania also.  Neg neuro work up for stroke in the past. SE balance and tremor. Some trouble getting out of chair? Plan: For mania,Increase olanzapine  to 15 mg PM Follow-up Friday for urgent visit  05/11/2021 appointment with the following noted: Calming down a little. He thinks he's sleeping better.  She says he needs whole Ambien with olanzapine  and last night still not sleeping.  He woke her with complaints of back pain. She's not seeing SE and nor is he.  She's not seeing much change  He says back pain is complicating his sleep and his problem. He tries to stay active.   She says he talks incessantly but not as much to her. She says major life events have triggered mania in him in the past. She notices him making poor decisions. Plan: Increase olanzapine  to 20 mg PM  05/24/2021 appointment with the following noted: with D Vina A week ago went of to doctor's office alone and wife called bc he was agitated. Per wife still going wide open but is sleeping  better. Yesterday had fever and talking out of his head and dx UTI. He still feels driven to do chores but distractible and inefficient.   D does not see improvement and  nor does wife.   7 hour sleep lately. Plan: Increase olanzapine  to 30 mg in evening Reduce oxcarbazepine  to 1 and 1/2 tablets in the evening.  06/04/2021 appointment: Seen with wife Sleeping more 7-9 hours.   Per wife still some confusion and still short tempered.  Less pressured sleep. He won't go to bed when sleepy and waits too long to get in bed and is weak and cannot do it by himself. Wife agrees he's less wide open than before and will be able to sit and watch TV show now. B died yesterday. Plan: Continue olanzapine  to 30 mg in evening Reduce oxcarbazepine   again to 1 tablets in the evening for 1 week, then 1/2 tablet at night for a week then stop it.  07/04/21 appt noted:  seen with wife Rock Hyper-ness is better.  Some trouble with concentration but able to make peanut butter fudge. Problems with afib.   Sleeping a lot and some instability.  Goes to sleep in chair.  Better with irritability.  No longer talking constantly but still some in spells.   Not depressed.   No change in balance off oxcarbazepine . Wife said last week mixed up meds and got oversedated.  She discovered the option. Plan: Reduce olanzapine  to 20 mg PM now that mania is largely resolved.  08/06/2021 appointment with the following noted: Only psych med is reduced olanzapine  20 mg HS RLS and fidgety.  All day.  Can sit for an hour.  Uncomfortable when sitting. Stopped Ginko and wife noticed RLS more.  She restarted it.  No longer rocking back and forth. Caffeine tea at lunch.  No coffee..  [place] some. RLS recently started. Bipolar sx great per wife.  Not wanting to go out much. CO tiredness.  Falls asleep in recliner.  Sleep all night. Eats sweets all his life. Plan: No longer manic. Reduce olanzapine  to 15 mg earlier in PM Stop  caffeine  09/06/2021 appointment with the following noted: Reduced olanzapine  15 mg and still RLS all the day. No change in that.  But can sit in church ok.  Will move legs when standing and talking to people. Sleeping more than he should from 10 until 9. And still naps and little initiative bc tired. Not depressed but not back to normal.  Not as social as usual. Loves to eat out. Plan: Reduce olanzapine  from 15 to 7.5 mg earlier in PM Stop caffeine  10/02/2021 appointment with the following noted: seen again with wife Rock Only psych med is the reduced dose of olanzapine  7.5 mg for 30 days. Back pain limits activity and getting shots. Taking tramadol for pain.  1-2 prn.  One helps. 2 is sedating. RLS is better and mainly now the problem is back pain.  Has to sleep in recliner DT pain.   Rock says hard to tell about effect from reduced olanzapine  bc he's having such back pain and taking tramadol.  Doesn't feel like doing anything. Plan continue olanzapine  7.5 mg every afternoon  12/04/2021 appointment with the following noted: seen with wife Still doing well with olanzapine  7.5 mg PM No SE.  RLS is OK now. Sleep good 8-10 hours. Not depressed nor manic sx.   Happy with meds.   Wife gall bladder surgery. H helping wife. Plan: olanzapine  7.5 mg earlier in PM Stopcaffeine   03/19/2022 phone call: Wife called complaining that her husband sleeping 14 to 15 hours a day on olanzapine  7.5 mg daily and wondering about trying to reduce  it. MD response: Olanzapine  is his only mood stabilizer at present.  There is a risk that he could get manic again with the dosage reduction.  But we can try reducing from 7.5 to 5 mg at night.  That may or may not help with his tendency to sleep excessively.  Some people will do that even on low doses of olanzapine .  Please send in prescription for olanzapine  5 mg tablets 1 nightly #30, 1 additional refill and make a note in the pharmacy section of the order to  cancel any other olanzapine  refills.     06/04/2022 appointment noted:  wife says a tad better with less olanzapine  but still wants to sleep and sit around a lot. Keeps saying I'm not depressed.   Reduced olanzapine  5 mg HS. No mania or depression. Hx sleep study and dx OSA but per wife got better with wt loss.  Not using CPAP. Eating less but wt loss stable. Asks about seasonal pattern bc of the history.  She can't remember the seasonal pattern.   Plan: Reduce  DT sleepiness olanzapine  2.5 mg earlier in PM Mild TD   07/29/22 TC: no changes with reduction so increased back to 5 mg olanzapine .  08/14/22 appt noted: Still sleeping too much 12 hours.   Also been irritable with flashes.   Something can set him off. Probably getting depressed some with less olanzapine .  Some anxiety.   Some reduction in interest and not going to church like before and doesn't like to be around people.  Doesn't want wife to help others either.   No restlessness.  Will nap in the afternoon. Not enjoying things and not laughing. Plan: Is getting more depressed again, resume Wellbutrin  XL up to 300 mg AM  10/14/22 appt noted:  with wife Rock Psych Med: olanzapine  5 HS, Wellbutrin  300 mg AM Feels a little more like getting out but wife says not much.  She sees him being a little more agitated and irritable.  Still doesn't want to go out normally or be with other people.  Usual self is social.  Don't talk as much.  Doesn't want to be alone and doesn't want wife to be social. Sleeps a lot.  Partly DT back pain. Not nervous or anxious. Plan: Is having apathetic depressed again,  and failed Wellbutrin .   Resume pramipexole  off label which worked before,  0.25 mg BID for 4 days then 0.375 mg BID  10/23/22 TC: still dep and wanting to increase pramipexole .  MD resp: incr to 0.5 mg BID  11/24/22 and neuro FU 01/08/23 noted:  follow-up for acute right frontal juxtacortical infarct most slightly due to A-fib due to  noncompliance with Xarelto .  Patient is here with his wife today.  He feels that he has returned to his baseline.   02/27/23 appt noted:  with Rock Since stroke not back to church.  No energy and not wanting to do anything.  Sleep 12 hours and watches TV and recliner.  Wife says sleep excess no change since stroke.  Still dep.   Still wanting to isolate from others. No anxiety. Plan: Stop olanzapine  Start Vraylar 1 in the AM Reduce pramipexole  to 1 twice daily for 4 days then 1/2 twice daily for 4 days then stop it. This plan will reduce meds and answer question about sleepiness from olanzapine  and reduce polypharmacy  03/18/23 TC Wife reporting no improvement in depression and he is still sleeping all the time. She is also reporting it  is too expensive and they would not be able to afford it, but PA has not been done. Requesting to go back to previous medications.  CC resp: Me  to Con Dawna DASEN, CMA  CC   03/19/23  6:39 PM Note It has not been long enough and I need to see him to find an alternative to Vraylar.  Stay on the Vraylar 1.5 mg daily until his appt later this month.  I don't want to resume olanzapine  which didn't work until we explore other options.     04/10/23 appt noted: seen with wife Rock Pencil meds: Vraylar 1.5 mg daily.  No olanzapine , pramipexole  stopped. Sleeping about the same but easier to get up per wife on Vraylar.  No change in mood.  Still feels dep.  Not wanting to go out and be around people.  Without change.  No social life.  More HA.   Still dealing with chronic TR afib and tx ongoing. Easily out of breath.   Will walk dog 10 mins.   Tires him.  Sleeps a lot during the day. Hx OSA but since lost wt not needing CPAP Doesn't laugh much. Not going to church DT dep. Worry a lot without change.  Will resolve when not dep.   Plan: Stop Vraylar Start 3 of the 150 mg capsules of lithium  at night Get a blood level in the morning 5-7 days later Use modafinil  for  alertness if needed  05/26/23 appt noted:  seen with D Vina Meds: lithium  450 HS, no Vraylar, modafinil  prn.   Lithium  level 0.4, Cr 1.34 pretty stable. Mood kind of OK.  Sleep is still too much.   Wife's notes no big change but is awake more than he was.  Hard to get OOB before 11.   EKG per card was normal QT but history permanent atrial fibrillation with hx stroke DT unintentional noncompliance with anticoagulation.  Plan:  lithium  450 HS.  Lithium  0.4.   Option modafinil  if not better in a couple of weeks.  Resume pramipexole  increase, to 0.375 mg BID off label for depression.  06/24/23 appt noted:  seen with wife, Rock Pencil meds: Lithium  450 nightly, pramipexole  0.25 mg tablets 1-1/2 twice daily, no modafinil  100 mg every morning About the same but a little better.  Getting out and going more . Per wife seems some better but she doesn't think he's doing much  and not as much as normal.  Not sig more social and not normal for  him. No sig SE.   Physically is moving better.   To bed 9 and will get up at 930 am instead of 11.   Pramipexole  helped a little. Mild stroke in May.  Will have Watchman device placed. Plan: For TRD increase pramipexole  to 0.5 mg BID and then to previous dose of 0.75 mg BID  07/29/23 appt noted:  with wife Meds: incr pramipexole  to 0.75 mg BID, lithium  450 mg daily,  Per wife: Absolutely  re: improvement per wife.  Getting out more and more wsocial and motivated.  More household chores. He noticed I'm just myself.  Talking more.  More interested.  Dep resolved. Noticed benefit gradually over a couple of weeks. More involuntary tongue movements  Past Psychiatric Medication Trials: Seroquel  300,  olanzapine  30 resolved mania,  (Improved  with Increase olanzapine  to 30 mg PM and off the oxcarbazepine .) perphenazine, risperidone,  Latuda,   Abilify, Vraylar 1.5 mg for 1 month without benefit Lithium  did great  helped but SE afib, kidneys and prostate,     alprazolam ,  Depakote, Trileptal  1800 balance problems  carbamazepine , lamotrigine  caused nausea,  citalopram, sertraline  150,    Wellbutrin  300 anxiety and irritability and failed 10/2022,  Pramipexole  0.375 BID helped Benztropine  confusion        1 prior psych hosp depression                                                    Review of Systems:  Review of Systems  Constitutional:  Positive for fatigue.  HENT:         Clicking teeth  Eyes:  Negative for visual disturbance.  Respiratory:  Negative for cough.   Gastrointestinal:  Positive for constipation.  Musculoskeletal:  Positive for arthralgias, back pain and gait problem.  Skin:  Negative for rash.       chronic  Neurological:  Positive for tremors and weakness.       Balance px longterm no worse More tongue movements  Psychiatric/Behavioral:  Positive for dysphoric mood. Negative for agitation, behavioral problems, confusion, decreased concentration, hallucinations, self-injury, sleep disturbance and suicidal ideas. The patient is not nervous/anxious and is not hyperactive.     Medications: I have reviewed the patient's current medications.  Current Outpatient Medications  Medication Sig Dispense Refill   acetaminophen  (TYLENOL ) 500 MG tablet Take 1,000 mg by mouth every 6 (six) hours as needed for headache.     amoxicillin  (AMOXIL ) 875 MG tablet Take 1 tablet (875 mg total) by mouth every 12 (twelve) hours. 14 tablet 0   cholecalciferol (VITAMIN D) 1000 UNITS tablet Take 2,000 Units by mouth daily.     levothyroxine  (SYNTHROID , LEVOTHROID) 25 MCG tablet Take 25 mcg by mouth daily before breakfast.     lithium  carbonate 150 MG capsule TAKE 3 CAPSULES BY MOUTH AT BEDTIME 90 capsule 0   metoprolol  succinate (TOPROL -XL) 25 MG 24 hr tablet Take 1 tablet (25 mg total) by mouth daily. 90 tablet 3   modafinil  (PROVIGIL ) 100 MG tablet 1/2 tablet in the AM for 1 week then 1 tablet in the AM 30 tablet 0   pantoprazole  (PROTONIX )  40 MG tablet TAKE ONE TABLET BY MOUTH ONCE DAILY (Patient taking differently: Take 40 mg by mouth daily.) 30 tablet 11   rivaroxaban  (XARELTO ) 20 MG TABS tablet Take 20 mg by mouth daily after supper.      rosuvastatin  (CRESTOR ) 20 MG tablet Take 1 tablet (20 mg total) by mouth daily. 30 tablet 0   SUPER B COMPLEX/C PO Take 1 tablet by mouth daily.     pramipexole  (MIRAPEX ) 0.75 MG tablet Take 1 tablet (0.75 mg total) by mouth 2 (two) times daily. 180 tablet 0   No current facility-administered medications for this visit.    Medication Side Effects: resolved  Allergies:  Allergies  Allergen Reactions   Amlodipine Other (See Comments)    Stopped due to kidney issues   Vicodin [Hydrocodone -Acetaminophen ] Other (See Comments)    Patient is unsure of reaction    Past Medical History:  Diagnosis Date   Arthritis    Asthma    Atrial fibrillation (HCC) 01/12/2010   2D Echo EF=>55%   Bipolar disorder (HCC)    Chronic back pain    Depression    Dysrhythmia    AFib   GERD (gastroesophageal  reflux disease)    History of colonic polyps    History of gout    Hyperlipidemia    Hypertension    Hypothyroidism    Morbid obesity (HCC)    OSA (obstructive sleep apnea)    AHI was 27.62hr RDI was 34.3 hr REM 0.00hr; had CPAP years ago but no longer uses.   Prostatitis    Tubular adenoma of colon 01/2016    Family History  Problem Relation Age of Onset   Diabetes Mother    Heart failure Mother    Hypertension Mother    Hypertension Father    Cancer Paternal Grandmother    Stroke Sister    Liver disease Brother    Vascular Disease Brother    Arthritis Brother    Colon cancer Neg Hx     Social History   Socioeconomic History   Marital status: Married    Spouse name: Not on file   Number of children: Not on file   Years of education: Not on file   Highest education level: Not on file  Occupational History   Not on file  Tobacco Use   Smoking status: Never   Smokeless  tobacco: Never  Vaping Use   Vaping status: Never Used  Substance and Sexual Activity   Alcohol use: No    Alcohol/week: 0.0 standard drinks of alcohol   Drug use: No   Sexual activity: Yes    Birth control/protection: None  Other Topics Concern   Not on file  Social History Narrative   Not on file   Social Drivers of Health   Financial Resource Strain: Not on file  Food Insecurity: No Food Insecurity (11/24/2022)   Hunger Vital Sign    Worried About Running Out of Food in the Last Year: Never true    Ran Out of Food in the Last Year: Never true  Transportation Needs: No Transportation Needs (11/24/2022)   PRAPARE - Administrator, Civil Service (Medical): No    Lack of Transportation (Non-Medical): No  Physical Activity: Not on file  Stress: Not on file  Social Connections: Not on file  Intimate Partner Violence: Patient Declined (11/24/2022)   Humiliation, Afraid, Rape, and Kick questionnaire    Fear of Current or Ex-Partner: Patient declined    Emotionally Abused: Patient declined    Physically Abused: Patient declined    Sexually Abused: Patient declined    Past Medical History, Surgical history, Social history, and Family history were reviewed and updated as appropriate.   Please see review of systems for further details on the patient's review from today.   Objective:   Physical Exam:  There were no vitals taken for this visit.  Physical Exam Constitutional:      General: He is not in acute distress. Musculoskeletal:        General: No deformity.  Neurological:     Mental Status: He is alert and oriented to person, place, and time.     Coordination: Coordination normal.     Gait: Gait normal.     Comments: Mild rocking motions legs resolved Mild lip licking and buccal movements  Psychiatric:        Attention and Perception: He is attentive. He does not perceive auditory hallucinations.        Mood and Affect: Mood is depressed. Mood is not  anxious. Affect is blunt. Affect is not labile, angry or inappropriate.        Speech: Speech is not rapid  and pressured or tangential.        Behavior: Behavior normal. Behavior is not agitated.        Thought Content: Thought content normal. Thought content is not delusional. Thought content does not include homicidal or suicidal ideation. Thought content does not include suicidal plan.        Cognition and Memory: Cognition normal.        Judgment: Judgment normal.     Comments: Insight intact. No auditory or visual hallucinations. No delusions.  More depressed again.  Not real sad. Gait is normal again. Lip smacking worse with pramipexole      Lab Review:     Component Value Date/Time   NA 146 (H) 07/23/2023 1608   K 4.3 07/23/2023 1608   CL 108 (H) 07/23/2023 1608   CO2 22 07/23/2023 1608   GLUCOSE 92 07/23/2023 1608   GLUCOSE 104 (H) 12/29/2022 0443   BUN 20 07/23/2023 1608   CREATININE 1.46 (H) 07/23/2023 1608   CALCIUM  9.3 07/23/2023 1608   PROT 7.0 11/24/2022 1252   PROT 6.6 12/28/2018 0941   ALBUMIN 3.9 11/24/2022 1252   ALBUMIN 4.5 12/28/2018 0941   AST 18 11/24/2022 1252   ALT 13 11/24/2022 1252   ALKPHOS 70 11/24/2022 1252   BILITOT 0.8 11/24/2022 1252   BILITOT 0.7 12/28/2018 0941   GFRNONAA >60 12/29/2022 0443   GFRAA 63 12/28/2018 0941       Component Value Date/Time   WBC 7.7 07/23/2023 1608   WBC 5.7 12/29/2022 0443   RBC 4.10 (L) 07/23/2023 1608   RBC 4.23 12/29/2022 0443   HGB 13.4 07/23/2023 1608   HCT 40.3 07/23/2023 1608   PLT 197 07/23/2023 1608   MCV 98 (H) 07/23/2023 1608   MCH 32.7 07/23/2023 1608   MCH 31.9 12/29/2022 0443   MCHC 33.3 07/23/2023 1608   MCHC 33.7 12/29/2022 0443   RDW 12.5 07/23/2023 1608   LYMPHSABS 0.7 12/29/2022 0443   LYMPHSABS 1.1 12/28/2018 0941   MONOABS 0.5 12/29/2022 0443   EOSABS 0.2 12/29/2022 0443   EOSABS 0.1 12/28/2018 0941   BASOSABS 0.0 12/29/2022 0443   BASOSABS 0.0 12/28/2018 0941    Lithium   Lvl  Date Value Ref Range Status  04/22/2023 0.4 (L) 0.5 - 1.2 mmol/L Final    Comment:    A concentration of 0.5-0.8 mmol/L is advised for long-term use; concentrations of up to 1.2 mmol/L may be necessary during acute treatment.                                  Detection Limit = 0.1                           <0.1 indicates None Detected      No results found for: PHENYTOIN, PHENOBARB, VALPROATE, CBMZ   .res Assessment: Plan:    Ayaan was seen today for follow-up and depression.  Diagnoses and all orders for this visit:  Bipolar affective disorder, currently depressed, moderate (HCC) -     pramipexole  (MIRAPEX ) 0.75 MG tablet; Take 1 tablet (0.75 mg total) by mouth 2 (two) times daily.   Likely TD   30 min face to face time with patient was spent on counseling and coordination of care. We discussed Dx bipolar at 78 yo.  Bipolar depression has recurred again like last year but so far is not as  severe.  Patient is very complicated because of weakness and medical problems.   History pseudodementia with depression. Prednisone  triggered some mania in 2023 and persisted off prednisone .    Dep resolved again with pramipexole  0.75 mg BID.  Mild lip smacking consistent with TD and appears worse with addtion of prampexole..   But is not disturbing to pt nor his wife.  If still a problem with sleepiness DT multiple med failures consider modafinil  to manage.  Cr is normal.  Failed multiple alternatives.  Disc risks lithium  but it is what worked best.  Will try LED DT medical problems including chronic afib. Counseled patient regarding potential benefits, risks, and side effects of lithium  to include potential risk of lithium  affecting thyroid  and renal function.  Discussed need for periodic lab monitoring to determine drug level and to assess for potential adverse effects.  Counseled patient regarding signs and symptoms of lithium  toxicity and advised that they notify office  immediately or seek urgent medical attention if experiencing these signs and symptoms.  Patient advised to contact office with any questions or concerns.  h lithium  450 HS.   Lithium  0.4.  04/22/23. CR 1.45 on 05/26/23.   07/01/23 Lithium  0.5.  no dosage change.  Had tremor at higher doses  Option modafinil  if not better in a couple of weeks.  Normal Qtc onEKG since here  Marked improvement dep For TRD after increase pramipexole  to previous dose of 0.75 mg BID Disc SE including manic risk  Follow-up 4 weeks  Lorene Macintosh, MD, DFAPA   Please see After Visit Summary for patient specific instructions.   Future Appointments  Date Time Provider Department Center  08/20/2023  8:40 AM Croitoru, Jerel, MD [facility] None  09/01/2023 11:00 AM Cottle, Lorene KANDICE Raddle., MD [facility] None      No orders of the defined types were placed in this encounter.      -------------------------------

## 2023-07-30 ENCOUNTER — Telehealth: Payer: Self-pay

## 2023-07-30 NOTE — Addendum Note (Signed)
 Addended by: SEBASTIAN LAMARR SAUNDERS on: 07/30/2023 01:41 PM   Modules accepted: Orders

## 2023-07-30 NOTE — Telephone Encounter (Signed)
-----   Message from Ardeen Kohler sent at 07/30/2023  1:07 PM EST ----- Yes, we need to get this evaluated. Order CT and place an urgent referral to pulmonary or oncology. I don't know who manages these things here. Likely will result in delay of Watchman at this point.   Josh ----- Message ----- From: Allison Ivory, RN Sent: 07/30/2023  11:45 AM EST To: Ardeen Kohler, MD  Hey! Will you take a look at this patient's CT overread? He is scheduled for LAAO 1/30. Should I go ahead and order CT now (prior to procedure) to look at that mass?  Thanks,  KK

## 2023-07-30 NOTE — Addendum Note (Signed)
 Addended by: SEBASTIAN LAMARR SAUNDERS on: 07/30/2023 04:43 PM   Modules accepted: Orders, Level of Service

## 2023-07-31 NOTE — Telephone Encounter (Signed)
See Casey [name]'s recent office note. The patient is now scheduled for CT 1/30 and knows Watchman is on hold. Tentatively rescheduled the patient's LAAO to 09/25/2023.

## 2023-08-05 ENCOUNTER — Encounter: Payer: Self-pay | Admitting: Family Medicine

## 2023-08-07 ENCOUNTER — Other Ambulatory Visit: Payer: Self-pay | Admitting: Psychiatry

## 2023-08-07 DIAGNOSIS — F3132 Bipolar disorder, current episode depressed, moderate: Secondary | ICD-10-CM

## 2023-08-12 ENCOUNTER — Other Ambulatory Visit: Payer: Self-pay | Admitting: Cardiovascular Disease

## 2023-08-14 ENCOUNTER — Ambulatory Visit (HOSPITAL_BASED_OUTPATIENT_CLINIC_OR_DEPARTMENT_OTHER)
Admission: RE | Admit: 2023-08-14 | Discharge: 2023-08-14 | Disposition: A | Discharge status: Home / Self Care | Payer: HMO | Source: Ambulatory Visit | Attending: Physician Assistant | Admitting: Physician Assistant

## 2023-08-14 DIAGNOSIS — R911 Solitary pulmonary nodule: Secondary | ICD-10-CM | POA: Diagnosis not present

## 2023-08-14 DIAGNOSIS — I4821 Permanent atrial fibrillation: Secondary | ICD-10-CM

## 2023-08-14 DIAGNOSIS — J984 Other disorders of lung: Secondary | ICD-10-CM | POA: Diagnosis not present

## 2023-08-14 DIAGNOSIS — J929 Pleural plaque without asbestos: Secondary | ICD-10-CM | POA: Diagnosis not present

## 2023-08-14 MED ORDER — IOHEXOL 300 MG/ML  SOLN
100.0000 mL | Freq: Once | INTRAMUSCULAR | Status: AC | PRN
  Administered 2023-08-14: 75 mL via INTRAVENOUS

## 2023-08-20 ENCOUNTER — Ambulatory Visit: Payer: Medicare Other | Admitting: Cardiovascular Disease

## 2023-08-21 NOTE — Telephone Encounter (Signed)
 Casey Chew, MD  [name], Casey Bridgers R, PA-C; Casey [name] LABOR, RN Great news! Okay to proceed. Thanks, Josh   Confirmed results with the patient's wife and she and the patient are excited to proceed with LAAO.  They requested to reschedule from 3/13 given their daughter will be out of town.  Scheduled him for LAAO 09/04/2023. They will come tomorrow for pre-procedure visit.  Ms. Geiman was grateful for call and agreed with plan.

## 2023-08-22 ENCOUNTER — Other Ambulatory Visit: Payer: Self-pay

## 2023-08-22 ENCOUNTER — Ambulatory Visit: Payer: HMO | Attending: Physician Assistant | Admitting: Physician Assistant

## 2023-08-22 VITALS — BP 126/78 | HR 103 | Ht 72.0 in | Wt 206.8 lb

## 2023-08-22 DIAGNOSIS — I639 Cerebral infarction, unspecified: Secondary | ICD-10-CM | POA: Diagnosis not present

## 2023-08-22 DIAGNOSIS — E785 Hyperlipidemia, unspecified: Secondary | ICD-10-CM | POA: Diagnosis not present

## 2023-08-22 DIAGNOSIS — I4821 Permanent atrial fibrillation: Secondary | ICD-10-CM

## 2023-08-22 DIAGNOSIS — D6869 Other thrombophilia: Secondary | ICD-10-CM

## 2023-08-22 DIAGNOSIS — G4733 Obstructive sleep apnea (adult) (pediatric): Secondary | ICD-10-CM

## 2023-08-22 DIAGNOSIS — Z8719 Personal history of other diseases of the digestive system: Secondary | ICD-10-CM

## 2023-08-22 DIAGNOSIS — F3131 Bipolar disorder, current episode depressed, mild: Secondary | ICD-10-CM | POA: Diagnosis not present

## 2023-08-22 DIAGNOSIS — R911 Solitary pulmonary nodule: Secondary | ICD-10-CM | POA: Diagnosis not present

## 2023-08-22 NOTE — Patient Instructions (Signed)
 Medication Instructions:  Your physician recommends that you continue on your current medications as directed. Please refer to the Current Medication list given to you today.  *If you need a refill on your cardiac medications before your next appointment, please call your pharmacy*  Lab Work: Your physician recommends that you have lab work today- BMET and CBC  If you have labs (blood work) drawn today and your tests are completely normal, you will receive your results only by: MyChart Message (if you have MyChart) OR A paper copy in the mail If you have any lab test that is abnormal or we need to change your treatment, we will call you to review the results.   Testing/Procedures: None ordered today.  Follow-Up: At [facility], you and your health needs are our priority.  As part of our continuing mission to provide you with exceptional heart care, we have created designated Provider Care Teams.  These Care Teams include your primary Cardiologist (physician) and Advanced Practice Providers (APPs -  Physician Assistants and Nurse Practitioners) who all work together to provide you with the care you need, when you need it.  We recommend signing up for the patient portal called MyChart.  Sign up information is provided on this After Visit Summary.  MyChart is used to connect with patients for Virtual Visits (Telemedicine).  Patients are able to view lab/test results, encounter notes, upcoming appointments, etc.  Non-urgent messages can be sent to your provider as well.   To learn more about what you can do with MyChart, go to [email or web address].    Your next appointment:   Will be arranged after procedure  Provider:   Izetta Hummer, PA-C    Other Instructions

## 2023-08-22 NOTE — Progress Notes (Signed)
 [facility]   MULTIDISCIPLINARY HEART VALVE CLINIC                                     Cardiology Office Note:    Date:  08/22/2023   ID:  [name], DOB [birth year], MRN [id number]  PCP:  [name], MD  [facility] Cardiologist:  Jerel Balding, MD  [facility] Electrophysiologist:  Fonda Kitty, MD   Referring MD: [name], MD   Pre LAAO visit - Watchman scheduled for 09/04/23 with Dr. Kitty  History of Present Illness:    Casey [name] is a 78 y.o. male with a hx of CVA, OSA, HTN, bipolar disorder, GI bleeding and permanent atrial fibrillation who presents to clinic for pre LAAO visit.  He reportedly had a stroke while off/noncompliant with oral anticoagulation. He admits to having difficulty maintaining adherence to oral anticoagulation, which he attributes to difficulty remembering his various regimens of numerous medications and frequent medication changes. His compliance also appears to change with his mood related to bipolar disorder. He is also leery of taking his anticoagulation given that he has had bleeding problems in the past. He had a GI bleed requiring transfusion following a polypectomy. He was referred to Dr. Kitty for consideration of LAAO with Watchman. Cardiac CT 06/30/23 showed a large chicken wing appendage suitable for a 35 mm Wathcman FLX device. Calcium  score was 23.2 isolated to LAD which is 17 th percentile for age/sex.  He was originally scheduled for January but his cardiac CT overread reported a pulmonary mass that needed further investigation. Follow up CT with contrast 08/14/23 showed a persistent, stable round density c/w a benign process.   Today the patient presents to clinic for follow up. Here with his wife. No CP or SOB. No LE edema, orthopnea or PND. No dizziness or syncope. No blood in stool or urine. No palpitations.    Past Medical History:  Diagnosis Date   Arthritis    Asthma    Atrial fibrillation (HCC)  01/12/2010   2D Echo EF=>55%   Bipolar disorder (HCC)    Chronic back pain    Depression    Dysrhythmia    AFib   GERD (gastroesophageal reflux disease)    History of colonic polyps    History of gout    Hyperlipidemia    Hypertension    Hypothyroidism    Morbid obesity (HCC)    OSA (obstructive sleep apnea)    AHI was 27.62hr RDI was 34.3 hr REM 0.00hr; had CPAP years ago but no longer uses.   Prostatitis    Tubular adenoma of colon 01/2016     Current Medications: Current Meds  Medication Sig   acetaminophen  (TYLENOL ) 500 MG tablet Take 1,000 mg by mouth every 6 (six) hours as needed for headache.   amoxicillin  (AMOXIL ) 875 MG tablet Take 1 tablet (875 mg total) by mouth every 12 (twelve) hours.   cholecalciferol (VITAMIN D) 1000 UNITS tablet Take 2,000 Units by mouth daily.   levothyroxine  (SYNTHROID , LEVOTHROID) 25 MCG tablet Take 25 mcg by mouth daily before breakfast.   lithium  carbonate 150 MG capsule TAKE 3 CAPSULES BY MOUTH AT BEDTIME   metoprolol  succinate (TOPROL -XL) 25 MG 24 hr tablet Take 1 tablet by mouth once daily   modafinil  (PROVIGIL ) 100 MG tablet 1/2 tablet in the AM for 1 week then 1 tablet in the AM  pantoprazole  (PROTONIX ) 40 MG tablet TAKE ONE TABLET BY MOUTH ONCE DAILY (Patient taking differently: Take 40 mg by mouth daily.)   pramipexole  (MIRAPEX ) 0.75 MG tablet Take 1 tablet (0.75 mg total) by mouth 2 (two) times daily.   rivaroxaban  (XARELTO ) 20 MG TABS tablet Take 20 mg by mouth daily after supper.    rosuvastatin  (CRESTOR ) 20 MG tablet Take 1 tablet (20 mg total) by mouth daily.   SUPER B COMPLEX/C PO Take 1 tablet by mouth daily.      ROS:   Please see the history of present illness.    All other systems reviewed and are negative.  EKGs   EKG Interpretation Date/Time:  Friday August 22 2023 09:14:07 EST Ventricular Rate:  103 PR Interval:    QRS Duration:  82 QT Interval:  376 QTC Calculation: 492 R Axis:   1  Text  Interpretation: Atrial fibrillation with rapid ventricular response Confirmed by Sebastian Collar [phone]) on 08/22/2023 9:21:19 AM   Risk Assessment/Calculations:    CHA2DS2-VASc Score = 4   This indicates a 4.8% annual risk of stroke. The patient's score is based upon: CHF History: 0 HTN History: 0 Diabetes History: 0 Stroke History: 2 Vascular Disease History: 0 Age Score: 2 Gender Score: 0   HAS-BLED score 3 Hypertension No  Abnormal renal and liver function (Dialysis, transplant, Cr >2.26 mg/dL /Cirrhosis or Bilirubin >2x Normal or AST/ALT/AP >3x Normal) No  Stroke Yes  Bleeding Yes  Labile INR (Unstable/high INR) No  Elderly (>65) Yes  Drugs or alcohol (>= 8 drinks/week, anti-plt or NSAID) No   Physical Exam:    VS:  BP 126/78   Pulse (!) 103   Ht 6' (1.829 m)   Wt 206 lb 12.8 oz (93.8 kg)   SpO2 98%   BMI 28.05 kg/m     Wt Readings from Last 3 Encounters:  08/22/23 206 lb 12.8 oz (93.8 kg)  07/23/23 206 lb (93.4 kg)  05/26/23 209 lb 12.8 oz (95.2 kg)     GEN: Well nourished, well developed in no acute distress NECK: No JVD CARDIAC: irreg irreg, no murmurs, rubs, gallops RESPIRATORY:  Clear to auscultation without rales, wheezing or rhonchi  ABDOMEN: Soft, non-tender, non-distended EXTREMITIES:  No edema; No deformity.   ASSESSMENT:    1. Permanent atrial fibrillation (HCC)   2. H/O: GI bleed   3. Hyperlipidemia, unspecified hyperlipidemia type   4. Bipolar affective disorder, currently depressed, mild (HCC)   5. OSA (obstructive sleep apnea)   6. Pulmonary nodule    PLAN:    In order of problems listed above:  Permanent atrial fibrillation with Hx of GI bleed: HR a little elevated today, but improved after sitting in the office. Continue Toprol  XL 25mg  daily. He is not felt to be a long term anticoagulation candidate secondary to history of nonadherence and bleeding. Currently tolerating Xarelto  20mg  daily with no issues. He is scheduled for Watchman  with Dr. Kennyth on 09/04/2023. EKG/CBC/BMET today. Instruction letter and soap given.   HLD: continue Crestor  20mg  daily.   Bipolar disorder: continue lithium  450mg  QHS  OSA: continue CPAP  CKD stage IIIa: creat 1.46 on most recent labs. Recheck BMET today  Pulmonary nodule: cardiac CT over read reported a pulmonary mass that needed further investigation. Follow up CT with contrast 08/14/23 showed a persistent, stable round density c/w a benign process. No further work up needed.   Medication Adjustments/Labs and Tests Ordered: Current medicines are reviewed at length with the  patient today.  Concerns regarding medicines are outlined above.  Orders Placed This Encounter  Procedures   Basic Metabolic Panel (BMET)   CBC   EKG 12-Lead   No orders of the defined types were placed in this encounter.   Patient Instructions  Medication Instructions:  Your physician recommends that you continue on your current medications as directed. Please refer to the Current Medication list given to you today.  *If you need a refill on your cardiac medications before your next appointment, please call your pharmacy*  Lab Work: Your physician recommends that you have lab work today- BMET and CBC  If you have labs (blood work) drawn today and your tests are completely normal, you will receive your results only by: MyChart Message (if you have MyChart) OR A paper copy in the mail If you have any lab test that is abnormal or we need to change your treatment, we will call you to review the results.   Testing/Procedures: None ordered today.  Follow-Up: At [facility], you and your health needs are our priority.  As part of our continuing mission to provide you with exceptional heart care, we have created designated Provider Care Teams.  These Care Teams include your primary Cardiologist (physician) and Advanced Practice Providers (APPs -  Physician Assistants and Nurse Practitioners) who all  work together to provide you with the care you need, when you need it.  We recommend signing up for the patient portal called MyChart.  Sign up information is provided on this After Visit Summary.  MyChart is used to connect with patients for Virtual Visits (Telemedicine).  Patients are able to view lab/test results, encounter notes, upcoming appointments, etc.  Non-urgent messages can be sent to your provider as well.   To learn more about what you can do with MyChart, go to [email or web address].    Your next appointment:   Will be arranged after procedure  Provider:   Izetta Hummer, PA-C    Other Instructions          Signed, Lamarr Hummer, PA-C  08/22/2023 10:08 AM    [facility]

## 2023-08-23 LAB — BASIC METABOLIC PANEL
BUN/Creatinine Ratio: 11 (ref 10–24)
BUN: 16 mg/dL (ref 8–27)
CO2: 24 mmol/L (ref 20–29)
Calcium: 9.5 mg/dL (ref 8.6–10.2)
Chloride: 106 mmol/L (ref 96–106)
Creatinine, Ser: 1.51 mg/dL — ABNORMAL HIGH (ref 0.76–1.27)
Glucose: 90 mg/dL (ref 70–99)
Potassium: 4.3 mmol/L (ref 3.5–5.2)
Sodium: 144 mmol/L (ref 134–144)
eGFR: 48 mL/min/{1.73_m2} — ABNORMAL LOW (ref >59)

## 2023-08-23 LAB — CBC
Hematocrit: 41.6 % (ref 37.5–51.0)
Hemoglobin: 13.6 g/dL (ref 13.0–17.7)
MCH: 32.2 pg (ref 26.6–33.0)
MCHC: 32.7 g/dL (ref 31.5–35.7)
MCV: 98 fL — ABNORMAL HIGH (ref 79–97)
Platelets: 181 10*3/uL (ref 150–450)
RBC: 4.23 x10E6/uL (ref 4.14–5.80)
RDW: 12.4 % (ref 11.6–15.4)
WBC: 7.7 10*3/uL (ref 3.4–10.8)

## 2023-09-01 ENCOUNTER — Encounter: Payer: Self-pay | Admitting: Psychiatry

## 2023-09-01 ENCOUNTER — Ambulatory Visit: Payer: HMO | Admitting: Psychiatry

## 2023-09-01 DIAGNOSIS — Z79899 Other long term (current) drug therapy: Secondary | ICD-10-CM | POA: Diagnosis not present

## 2023-09-01 DIAGNOSIS — F411 Generalized anxiety disorder: Secondary | ICD-10-CM | POA: Diagnosis not present

## 2023-09-01 DIAGNOSIS — G4733 Obstructive sleep apnea (adult) (pediatric): Secondary | ICD-10-CM

## 2023-09-01 DIAGNOSIS — G4710 Hypersomnia, unspecified: Secondary | ICD-10-CM

## 2023-09-01 DIAGNOSIS — F3132 Bipolar disorder, current episode depressed, moderate: Secondary | ICD-10-CM

## 2023-09-01 MED ORDER — PRAMIPEXOLE DIHYDROCHLORIDE 1 MG PO TABS: 1.0000 mg | ORAL_TABLET | Freq: Two times a day (BID) | ORAL | 0 refills | Status: DC

## 2023-09-01 NOTE — Progress Notes (Signed)
KADEEN SROKA [id number] 1945/11/16 77 y.o.    Subjective:   Patient ID:  Casey [name] is a 78 y.o. (DOB 1945-12-28) male.  Chief Complaint:  Chief Complaint  Patient presents with   Follow-up   Depression   Medication Reaction    HPI  REYANSH KUSHNIR presents for follow-up of bipolar disorder and anxiety and history of confusion  At visit  Dec 02, 2018.  He was having confusion and benztropine was stopped to see if that was the cause.  Also it was having balance issues and therefore Trileptal was switched all to nighttime 1200 mg nightly. Confusion much better per both of them with DC benztropine.  Balance better with change Trileptal.   visit was January 13, 2019 and he was doing well as noted above and he was encouraged not to make further med changes at that time.  visit April 13, 2019.  He was doing better with regard to depression than he had in quite some time on the Wellbutrin and no meds were changed.  He was having health problems with pulmonary effusion and fluid removed twice related to fall in January 2020.  seen September 13, 2019.  For residual depression the following decisions were made: Balance problem much improved with dosing Trileptal all in the evening. Depression under partial control & wife wants it to be better per his wife.  No mood swings.   Increase bupropion XL to 2 of the 150 mg tablets in the morning until the current bottle is gone. Then new RX will be 1 of the 300 mg tablets each morning.  November 08, 2019 appointment the following is noted:  Seen with wife, [name]. Small amount of improvement with change.  Anxiety got worse.  Notices when he goes out.  Worries about social and political things.  Wife keeps him active at home.  Allerigies bother him..  No sig caffeine except tea when goes out.  Wife sees him as a little more active and initiative.  More productive.  Started some cooking.  Getting out and up better.  A little more talkative.   "He wants  to be hyper but we know what happens". Wife [name] thinks   But for the last 2 years he's done nothing and laid around all the time.  He's a lot more active now. Big improvement. Went to church first time in 2 years. Plan:  For TRD, Pramipexole 0.25 mg tablet 1/2 twice daily for 1 week then 1 twice daily.  12/20/2019 appointment with the following noted:  Seen with wife. A little better motivated to get out but not to get in crowds.  Wife agrees more motivated and went to church once alone without pressure.  Wife says he's more talkative and much  Better interest.  Still not motivated to do things like mow the grass.  Others's have seen a difference. Concentration and memory are improved but not normal. No Se pramipexole.  Getting out of bed earlier and better now.. Wife admisters meds bc earlier he messsed them up. Plan: Wife has seen a big improvement therefore Partial improvement is clear from this so increase Pramipexole 0.375 mg tablet  twice daily  Anxiety is not fully managed.  Partly related to depression. Tremor is manageable.    02/15/20 appt with the following noted:  Send with wife [name] Doing good.  Increased pramipexole as indicated.  Tolerating meds Wellbutrin XL 300 mg AM & Trileptal 1200 mg HS with it. Much better with  depression.  Stays up more and speinding less time in bed.   Wife agrees he's much better and handling things better. 2 1/2 yo 4th cousin girl drowned in pond lately.  Big family. Wife pleased he handled it better. SE constipation ? Related. History Linzess but diarrhea. He's not highly motivated but it is better.  Depression is much improved.  Had been depressed for years. Plan no med changes  06/13/2020 appointment with the following noted: Pretty good but cardiologist noted today increased pulse needing treatment.  Uncontrolled afib even after ablation. Hard to tell about depression bc of fatigue Dt heart problems. Wife said he did well Thanksgiving and  participated more in conversation per wife. Plan: no med changes  09/06/2020 appointment with following noted: Remains on Wellbutrin XL 300 mg every morning, ginkgo biloba 40 mg twice daily, oxcarbazepine 1200 mg nightly, pramipexole 0.75 mg twice daily. Less sig health concerns. He thinks mood has been pretty good.  Consistent with meds. Wife thinks he stays tired and sleepy and wonders if can give him a stimulant.   Sleep good.  Will nap.  Doesn't bother him.  Walks dog 20 min in AM.  Not much initiative per wife.  Not depressed but when depressed he stayed in the bed lasting a couple of years.  History of OSA but lost weight and doesn't think he has apnea anymore.  No CPAP. Chronic afib. Not much caffeine. Plan: Wife& patient has seen a big improvement with Pramipexole 0.375 mg tablet  twice daily They ask consideration be given to the following: If his energy gets better by treating his tachycardia they would like to consider reducing the pramipexole or possibly eliminating it at follow-up.   For energy trial reduction pramipexole to 0.25 mg BID.  If gets more depressed call. Alternative of modafinil 100  12/04/2020 appointment with the following noted: Energy is better with awakening earlier at 8 and doing more and getting out more.  Going to bed later with 8 hour  Sleep. Mood fluctuates with more reacting to wife mainly.  More outspoken.  Per W he has to vent.  She thinks he is too focused on politics and issues.   Plan: Depression under control.  No mood swings.   Continue bupropion XL 300 mg tablets each morning. Continue Trileptal 600 mg 2 at HS. No higher DT anxiety risk and no traditional stimulants DT afib.  Wife & patient has seen a big improvement with Pramipexole and able to reduce to  0.25 mg tablet  twice daily  02/20/2021 appointment with the following noted:  seen with wife Prednisone made him real hyper.  Been off of it for at least a couple weeks.  Hyperverbal and  hyperactive.  Hip pain interferes with sleep.  Sees doctor tomorrow. Average 3-4 hours at night and no naps bc busy.  No excessive spending but more than usual.  Denies appetite disturbance.  Patient reports that energy and motivation have been reduced.  Patient has difficulty with concentration and memory.  Patient denies any suicidal ideation.  W had MI in June. A/P: mild mania Wean Wellbutrin over 2 weeks. Continue Trileptal 600 mg 2 at HS. Wife & patient has seen a big improvement with Pramipexole and able to reduce to  0.25 mg tablet  twice daily. Continue it unless mania does not resolve with less bupropion.  03/26/2021 phone call: [name] stated Dontavian has slightly improved since last week.He has not been able to sleep and was up at 2  am texting people in a disoriented state.His pcp put him on Ambien 10 mg and this has helped him sleep.However,linda stated he is still working him self too much.She said he is jittery and has to have something to do at all times.He does not want to slow down or get rest.She thinks he needs to cutback on the Mirapex as well. MD response:  I agree with his wife about reducing Mirapex from 1 tablet twice a day to half tablet twice a day for 1 week and if his symptoms are not resolved and stop the Mirapex.  04/18/2021 phone call with wife and MD: Patient is still manic after stopping Wellbutrin and pramipexole.  Wife's concern is hard to get him to settle down at night.  Reviewed with wife prior psychiatric medications which are multiple.  Typically one would use an antipsychotic in this case but she does not think that they worked in the past.  She says he has been on higher doses of Trileptal and tolerated it. Increase Trileptal to 600 mg every morning and 1200 mg nightly for mania.  Keep scheduled appointment.  05/02/2021 wife reports patient still manic: We cannot go higher in the Trileptal because of balance issues.  Therefore olanzapine 10 mg nightly was started and  we discussed the option of hospitalization.  We will try to get the patient in urgently to an appointment.  05/07/2021 appointment with the following noted: seen with wife, [name] Not doing too well.  Trouble getting out of bed.  Wife says he's still up in middle of the night but he says he's sleeping better.  Sleep in recliner downstairs. She says he's still manic with hyperactive, hyper graphia but won't show her what he's writing. On olanzapine  No changes off Wellbutrin and pramipexole. Apparently more balance problems with increase Trileptal to 1800 mg daily.  Dropped back to 1200 mg HS. Admits to some confusion.  Has pulled off labels on bottle per wife. She thinks he's averaging 4-6 hours of sleep and a little better than it was. Wife said he started cussing and he has not done that in the past. Other episodes of inappropriate decision making.  He recognizes he's manic but can't control himself.  Hyperverbal, pressured.  In past had speech problems with mania also.  Neg neuro work up for stroke in the past. SE balance and tremor. Some trouble getting out of chair? Plan: For mania,Increase olanzapine to 15 mg PM Follow-up Friday for urgent visit  05/11/2021 appointment with the following noted: Calming down a little. He thinks he's sleeping better.  She says he needs whole Ambien with olanzapine and last night still not sleeping.  He woke her with complaints of back pain. She's not seeing SE and nor is he.  She's not seeing much change  He says back pain is complicating his sleep and his problem. He tries to stay active.   She says he talks incessantly but not as much to her. She says major life events have triggered mania in him in the past. She notices him making poor decisions. Plan: Increase olanzapine to 20 mg PM  05/24/2021 appointment with the following noted: with D Gunnar Fusi A week ago went of to doctor's office alone and wife called bc he was agitated. Per wife still going wide  open but is sleeping better. Yesterday had fever and talking out of his head and dx UTI. He still feels driven to do chores but distractible and inefficient.   D does  not see improvement and nor does wife.   7 hour sleep lately. Plan: Increase olanzapine to 30 mg in evening Reduce oxcarbazepine to 1 and 1/2 tablets in the evening.  06/04/2021 appointment: Seen with wife Sleeping more 7-9 hours.   Per wife still some confusion and still short tempered.  Less pressured sleep. He won't go to bed when sleepy and waits too long to get in bed and is weak and cannot do it by himself. Wife agrees he's less "wide open" than before and will be able to sit and watch TV show now. B died yesterday. Plan: Continue olanzapine to 30 mg in evening Reduce oxcarbazepine  again to 1 tablets in the evening for 1 week, then 1/2 tablet at night for a week then stop it.  07/04/21 appt noted:  seen with wife [name] Hyper-ness is better.  Some trouble with concentration but able to make peanut butter fudge. Problems with afib.   Sleeping a lot and some instability.  Goes to sleep in chair.  Better with irritability.  No longer talking constantly but still some in spells.   Not depressed.   No change in balance off oxcarbazepine. Wife said last week mixed up meds and got oversedated.  She discovered the option. Plan: Reduce olanzapine to 20 mg PM now that mania is largely resolved.  08/06/2021 appointment with the following noted: Only psych med is reduced olanzapine 20 mg HS RLS and fidgety.  All day.  Can sit for an hour.  Uncomfortable when sitting. Stopped Ginko and wife noticed RLS more.  She restarted it.  No longer rocking back and forth. Caffeine tea at lunch.  No coffee..  [place] some. RLS recently started. Bipolar sx great per wife.  Not wanting to go out much. CO tiredness.  Falls asleep in recliner.  Sleep all night. Eats sweets all his life. Plan: No longer manic. Reduce olanzapine to 15 mg  earlier in PM Stop caffeine  09/06/2021 appointment with the following noted: Reduced olanzapine 15 mg and still RLS all the day. No change in that.  But can sit in church ok.  Will move legs when standing and talking to people. Sleeping more than he should from 10 until 9. And still naps and little initiative bc tired. Not depressed but not back to normal.  Not as social as usual. Loves to eat out. Plan: Reduce olanzapine from 15 to 7.5 mg earlier in PM Stop caffeine  10/02/2021 appointment with the following noted: seen again with wife [name] Only psych med is the reduced dose of olanzapine 7.5 mg for 30 days. Back pain limits activity and getting shots. Taking tramadol for pain.  1-2 prn.  One helps. 2 is sedating. RLS is better and mainly now the problem is back pain.  Has to sleep in recliner DT pain.   [name] says hard to tell about effect from reduced olanzapine bc he's having such back pain and taking tramadol.  Doesn't feel like doing anything. Plan continue olanzapine 7.5 mg every afternoon  12/04/2021 appointment with the following noted: seen with wife Still doing well with olanzapine 7.5 mg PM No SE.  RLS is OK now. Sleep good 8-10 hours. Not depressed nor manic sx.   Happy with meds.   Wife gall bladder surgery. H helping wife. Plan: olanzapine 7.5 mg earlier in PM Stopcaffeine   03/19/2022 phone call: Wife called complaining that her husband sleeping 14 to 15 hours a day on olanzapine 7.5 mg daily and wondering  about trying to reduce it. MD response: Olanzapine is his only mood stabilizer at present.  There is a risk that he could get manic again with the dosage reduction.  But we can try reducing from 7.5 to 5 mg at night.  That may or may not help with his tendency to sleep excessively.  Some people will do that even on low doses of olanzapine.  Please send in prescription for olanzapine 5 mg tablets 1 nightly #30, 1 additional refill and make a note in the pharmacy section  of the order to cancel any other olanzapine refills.     06/04/2022 appointment noted:  wife says a tad better with less olanzapine but still wants to sleep and sit around a lot. Keeps saying I'm not depressed.   Reduced olanzapine 5 mg HS. No mania or depression. Hx sleep study and dx OSA but per wife got better with wt loss.  Not using CPAP. Eating less but wt loss stable. Asks about seasonal pattern bc of the history.  She can't remember the seasonal pattern.   Plan: Reduce  DT sleepiness olanzapine 2.5 mg earlier in PM Mild TD   07/29/22 TC: no changes with reduction so increased back to 5 mg olanzapine.  08/14/22 appt noted: Still sleeping too much 12 hours.   Also been irritable with flashes.   Something can set him off. Probably getting depressed some with less olanzapine.  Some anxiety.   Some reduction in interest and not going to church like before and doesn't like to be around people.  Doesn't want wife to help others either.   No restlessness.  Will nap in the afternoon. Not enjoying things and not laughing. Plan: Is getting more depressed again, resume Wellbutrin XL up to 300 mg AM  10/14/22 appt noted:  with wife [name] Psych Med: olanzapine 5 HS, Wellbutrin 300 mg AM Feels a little more like getting out but wife says not much.  She sees him being a little more agitated and irritable.  Still doesn't want to go out normally or be with other people.  Usual self is social.  Don't talk as much.  Doesn't want to be alone and doesn't want wife to be social. Sleeps a lot.  Partly DT back pain. Not nervous or anxious. Plan: Is having apathetic depressed again,  and failed Wellbutrin.   Resume pramipexole off label which worked before,  0.25 mg BID for 4 days then 0.375 mg BID  10/23/22 TC: still dep and wanting to increase pramipexole.  MD resp: incr to 0.5 mg BID  11/24/22 and neuro FU 01/08/23 noted:  follow-up for acute right frontal juxtacortical infarct most slightly due to  A-fib due to noncompliance with Xarelto.  Patient is here with his wife today.  He feels that he has returned to his baseline.   02/27/23 appt noted:  with [name] Since stroke not back to church.  No energy and not wanting to do anything.  Sleep 12 hours and watches TV and recliner.  Wife says sleep excess no change since stroke.  Still dep.   Still wanting to isolate from others. No anxiety. Plan: Stop olanzapine Start Vraylar 1 in the AM Reduce pramipexole to 1 twice daily for 4 days then 1/2 twice daily for 4 days then stop it. This plan will reduce meds and answer question about sleepiness from olanzapine and reduce polypharmacy  03/18/23 TC" Wife reporting no improvement in depression and he is still sleeping all the time. She  is also reporting it is too expensive and they would not be able to afford it, but PA has not been done. Requesting to go back to previous medications.  CC resp: Me  to Franchot Erichsen, CMA  CC   03/19/23  6:39 PM Note It has not been long enough and I need to see him to find an alternative to Vraylar.  Stay on the Vraylar 1.5 mg daily until his appt later this month.  I don't want to resume olanzapine which didn't work until we explore other options.     04/10/23 appt noted: seen with wife Hildred Alamin meds: Vraylar 1.5 mg daily.  No olanzapine, pramipexole stopped. Sleeping about the same but easier to get up per wife on Vraylar.  No change in mood.  Still feels dep.  Not wanting to go out and be around people.  Without change.  No social life.  More HA.   Still dealing with chronic TR afib and tx ongoing. Easily out of breath.   Will walk dog 10 mins.   Tires him.  Sleeps a lot during the day. Hx OSA but since lost wt not needing CPAP Doesn't laugh much. Not going to church DT dep. Worry a lot without change.  Will resolve when not dep.   Plan: Stop Vraylar Start 3 of the 150 mg capsules of lithium at night Get a blood level in the morning 5-7 days later Use  modafinil for alertness if needed  05/26/23 appt noted:  seen with D Gunnar Fusi Meds: lithium 450 HS, no Vraylar, modafinil prn.   Lithium level 0.4, Cr 1.34 pretty stable. Mood kind of OK.  Sleep is still too much.   Wife's notes no big change but is awake more than he was.  Hard to get OOB before 11.   EKG per card was normal QT but history "permanent atrial fibrillation" with hx stroke DT unintentional noncompliance with anticoagulation.  Plan:  lithium 450 HS.  Lithium 0.4.   Option modafinil if not better in a couple of weeks.  Resume pramipexole increase, to 0.375 mg BID off label for depression.  06/24/23 appt noted:  seen with wife, Hildred Alamin meds: Lithium 450 nightly, pramipexole 0.25 mg tablets 1-1/2 twice daily, no modafinil 100 mg every morning About the same but a little better.  Getting out and going more . Per wife seems some better but she doesn't think he's doing much  and not as much as normal.  Not sig more social and not normal for  him. No sig SE.   Physically is moving better.   To bed 9 and will get up at 930 am instead of 11.   Pramipexole helped a little. Mild stroke in May.  Will have Watchman device placed. Plan: For TRD increase pramipexole to 0.5 mg BID and then to previous dose of 0.75 mg BID  07/29/23 appt noted:  with wife Meds: incr pramipexole to 0.75 mg BID, lithium 450 mg daily,  Per wife: "Absolutely"  re: improvement per wife.  Getting out more and more wsocial and motivated.  More household chores. He noticed "I'm just myself".  Talking more.  More interested.  Dep resolved. Noticed benefit gradually over a couple of weeks. More involuntary tongue movements  09/01/23 appt noted: with wife [name] Meds: as above. A little more active and getting out more and less depressed and more interested. SE tremor occ &  Tongue movements about the same and when nervous is worse. Sleep  well.  Wife says dep much better.  Some laughter occ which hasn't been there.   She doesn't feel he is all the way well.  Still likes to sit around a lot. Not as consisstent with Xarelto as he should be.  Had 2-3 ministrokes.  Pending surgery to keep that from being necessary. Plan incr pramipexole 1 mg BID   Past Psychiatric Medication Trials: Seroquel 300,  olanzapine 30 resolved mania,  (Improved  with Increase olanzapine to 30 mg PM and off the oxcarbazepine.) perphenazine, risperidone,  Latuda,   Abilify, Vraylar 1.5 mg for 1 month without benefit Lithium did great helped but SE afib, kidneys and prostate,    alprazolam,  Depakote, Trileptal 1800 balance problems  carbamazepine, lamotrigine caused nausea,  citalopram, sertraline 150,    Wellbutrin 300 anxiety and irritability and failed 10/2022,  Pramipexole 0.375 BID helped Benztropine confusion        1 prior psych hosp depression                                                    Review of Systems:  Review of Systems  Constitutional:  Positive for fatigue.  HENT:         Clicking teeth  Eyes:  Negative for visual disturbance.  Respiratory:  Negative for cough.   Gastrointestinal:  Positive for constipation.  Musculoskeletal:  Positive for arthralgias, back pain and gait problem.  Skin:  Negative for rash.       chronic  Neurological:  Positive for tremors and weakness.       Balance px longterm no worse More tongue movements  Psychiatric/Behavioral:  Positive for dysphoric mood. Negative for agitation, behavioral problems, confusion, decreased concentration, hallucinations, self-injury, sleep disturbance and suicidal ideas. The patient is not nervous/anxious and is not hyperactive.     Medications: I have reviewed the patient's current medications.  Current Outpatient Medications  Medication Sig Dispense Refill   acetaminophen (TYLENOL) 500 MG tablet Take 1,000 mg by mouth every 6 (six) hours as needed for headache.     albuterol (VENTOLIN HFA) 108 (90 Base) MCG/ACT inhaler Inhale 2 puffs into  the lungs every 6 (six) hours as needed (Asthma).     Cholecalciferol (VITAMIN D3) 250 MCG (10000 UT) capsule Take 10,000 Units by mouth daily.     levothyroxine (SYNTHROID, LEVOTHROID) 25 MCG tablet Take 25 mcg by mouth daily before breakfast.     lithium carbonate 150 MG capsule TAKE 3 CAPSULES BY MOUTH AT BEDTIME 90 capsule 0   metoprolol succinate (TOPROL-XL) 25 MG 24 hr tablet Take 1 tablet by mouth once daily 90 tablet 3   modafinil (PROVIGIL) 100 MG tablet 1/2 tablet in the AM for 1 week then 1 tablet in the AM 30 tablet 0   pantoprazole (PROTONIX) 40 MG tablet TAKE ONE TABLET BY MOUTH ONCE DAILY (Patient taking differently: Take 40 mg by mouth daily.) 30 tablet 11   rivaroxaban (XARELTO) 20 MG TABS tablet Take 20 mg by mouth daily after supper.      rosuvastatin (CRESTOR) 20 MG tablet Take 1 tablet (20 mg total) by mouth daily. 30 tablet 0   SUPER B COMPLEX/C PO Take 1 tablet by mouth daily.     pramipexole (MIRAPEX) 1 MG tablet Take 1 tablet (1 mg total) by mouth 2 (two) times daily. 180  tablet 0   No current facility-administered medications for this visit.    Medication Side Effects: resolved  Allergies:  Allergies  Allergen Reactions   Amlodipine Other (See Comments)    Stopped due to kidney issues   Vicodin [Hydrocodone-Acetaminophen] Other (See Comments)    Patient is unsure of reaction    Past Medical History:  Diagnosis Date   Arthritis    Asthma    Atrial fibrillation (HCC) 01/12/2010   2D Echo EF=>55%   Bipolar disorder (HCC)    Chronic back pain    Depression    Dysrhythmia    AFib   GERD (gastroesophageal reflux disease)    History of colonic polyps    History of gout    Hyperlipidemia    Hypertension    Hypothyroidism    Morbid obesity (HCC)    OSA (obstructive sleep apnea)    AHI was 27.62hr RDI was 34.3 hr REM 0.00hr; had CPAP years ago but no longer uses.   Prostatitis    Tubular adenoma of colon 01/2016    Family History  Problem Relation  Age of Onset   Diabetes Mother    Heart failure Mother    Hypertension Mother    Hypertension Father    Cancer Paternal Grandmother    Stroke Sister    Liver disease Brother    Vascular Disease Brother    Arthritis Brother    Colon cancer Neg Hx     Social History   Socioeconomic History   Marital status: Married    Spouse name: Not on file   Number of children: Not on file   Years of education: Not on file   Highest education level: Not on file  Occupational History   Not on file  Tobacco Use   Smoking status: Never   Smokeless tobacco: Never  Vaping Use   Vaping status: Never Used  Substance and Sexual Activity   Alcohol use: No    Alcohol/week: 0.0 standard drinks of alcohol   Drug use: No   Sexual activity: Yes    Birth control/protection: None  Other Topics Concern   Not on file  Social History Narrative   Not on file   Social Drivers of Health   Financial Resource Strain: Not on file  Food Insecurity: No Food Insecurity (11/24/2022)   Hunger Vital Sign    Worried About Running Out of Food in the Last Year: Never true    Ran Out of Food in the Last Year: Never true  Transportation Needs: No Transportation Needs (11/24/2022)   PRAPARE - Administrator, Civil Service (Medical): No    Lack of Transportation (Non-Medical): No  Physical Activity: Not on file  Stress: Not on file  Social Connections: Not on file  Intimate Partner Violence: Patient Declined (11/24/2022)   Humiliation, Afraid, Rape, and Kick questionnaire    Fear of Current or Ex-Partner: Patient declined    Emotionally Abused: Patient declined    Physically Abused: Patient declined    Sexually Abused: Patient declined    Past Medical History, Surgical history, Social history, and Family history were reviewed and updated as appropriate.   Please see review of systems for further details on the patient's review from today.   Objective:   Physical Exam:  There were no vitals  taken for this visit.  Physical Exam Constitutional:      General: He is not in acute distress. Musculoskeletal:        General: No  deformity.  Neurological:     Mental Status: He is alert and oriented to person, place, and time.     Coordination: Coordination normal.     Gait: Gait normal.     Comments: Mild rocking motions legs resolved Mild + lip licking and buccal movements ongoing  Psychiatric:        Attention and Perception: He is attentive. He does not perceive auditory hallucinations.        Mood and Affect: Mood is depressed. Mood is not anxious. Affect is blunt. Affect is not labile, angry or inappropriate.        Speech: Speech is not rapid and pressured or tangential.        Behavior: Behavior normal. Behavior is not agitated.        Thought Content: Thought content normal. Thought content is not delusional. Thought content does not include homicidal or suicidal ideation. Thought content does not include suicidal plan.        Cognition and Memory: Cognition normal.        Judgment: Judgment normal.     Comments: Insight intact. No auditory or visual hallucinations. No delusions.  More depressed again.  Not real sad. Gait is normal again. Lip smacking worse with pramipexole     Lab Review:     Component Value Date/Time   NA 144 08/22/2023 1229   K 4.3 08/22/2023 1229   CL 106 08/22/2023 1229   CO2 24 08/22/2023 1229   GLUCOSE 90 08/22/2023 1229   GLUCOSE 104 (H) 12/29/2022 0443   BUN 16 08/22/2023 1229   CREATININE 1.51 (H) 08/22/2023 1229   CALCIUM 9.5 08/22/2023 1229   PROT 7.0 11/24/2022 1252   PROT 6.6 12/28/2018 0941   ALBUMIN 3.9 11/24/2022 1252   ALBUMIN 4.5 12/28/2018 0941   AST 18 11/24/2022 1252   ALT 13 11/24/2022 1252   ALKPHOS 70 11/24/2022 1252   BILITOT 0.8 11/24/2022 1252   BILITOT 0.7 12/28/2018 0941   GFRNONAA >60 12/29/2022 0443   GFRAA 63 12/28/2018 0941       Component Value Date/Time   WBC 7.7 08/22/2023 1229   WBC 5.7  12/29/2022 0443   RBC 4.23 08/22/2023 1229   RBC 4.23 12/29/2022 0443   HGB 13.6 08/22/2023 1229   HCT 41.6 08/22/2023 1229   PLT 181 08/22/2023 1229   MCV 98 (H) 08/22/2023 1229   MCH 32.2 08/22/2023 1229   MCH 31.9 12/29/2022 0443   MCHC 32.7 08/22/2023 1229   MCHC 33.7 12/29/2022 0443   RDW 12.4 08/22/2023 1229   LYMPHSABS 0.7 12/29/2022 0443   LYMPHSABS 1.1 12/28/2018 0941   MONOABS 0.5 12/29/2022 0443   EOSABS 0.2 12/29/2022 0443   EOSABS 0.1 12/28/2018 0941   BASOSABS 0.0 12/29/2022 0443   BASOSABS 0.0 12/28/2018 0941    Lithium Lvl  Date Value Ref Range Status  04/22/2023 0.4 (L) 0.5 - 1.2 mmol/L Final    Comment:    A concentration of 0.5-0.8 mmol/L is advised for long-term use; concentrations of up to 1.2 mmol/L may be necessary during acute treatment.                                  Detection Limit = 0.1                           <0.1 indicates None Detected  No results found for: "PHENYTOIN", "PHENOBARB", "VALPROATE", "CBMZ"   .res Assessment: Plan:    Manases was seen today for follow-up, depression and medication reaction.  Diagnoses and all orders for this visit:  Bipolar affective disorder, currently depressed, moderate (HCC) -     pramipexole (MIRAPEX) 1 MG tablet; Take 1 tablet (1 mg total) by mouth 2 (two) times daily.  Generalized anxiety disorder  Hypersomnolence disorder, acute, moderate  Obstructive sleep apnea syndrome  Lithium use    Likely TD   30 min face to face time with patient was spent on counseling and coordination of care. We discussed Dx bipolar at 78 yo.  Bipolar depression has recurred again like last year but so far is not as severe.  Patient is very complicated because of weakness and medical problems.   History pseudodementia with depression. Prednisone triggered some mania in 2023 and persisted off prednisone.    Dep resolved again with pramipexole 0.75 mg BID.  Mild lip smacking consistent with TD and appears  worse with addtion of prampexole..   But is not disturbing to pt nor his wife.  If still a problem with sleepiness DT multiple med failures consider modafinil to manage.  Cr is abnormal.  Failed multiple alternatives.  Disc risks lithium but it is what worked best.  Will try LED DT medical problems including chronic afib. Counseled patient regarding potential benefits, risks, and side effects of lithium to include potential risk of lithium affecting thyroid and renal function.  Discussed need for periodic lab monitoring to determine drug level and to assess for potential adverse effects.  Counseled patient regarding signs and symptoms of lithium toxicity and advised that they notify office immediately or seek urgent medical attention if experiencing these signs and symptoms.  Patient advised to contact office with any questions or concerns.  h lithium 450 HS.   Lithium 0.4.  04/22/23. CR 1.45 on 05/26/23.   Cr 1.20 Jul 2023.  Disc this concern at length.  Might have to change again if progresses further. 07/01/23 Lithium 0.5.  no dosage change.  Had tremor at higher doses  Option modafinil if not better in a couple of weeks. Never took Normal Qtc onEKG since here  Marked improvement dep For TRD after increase pramipexole to previous dose of 0.75 mg BID but not back to normal. Increase to 1 mg twice daily. Disc SE including manic risk, but dep interfering with tx  Follow-up 4 weeks  Meredith Staggers, MD, DFAPA   Please see After Visit Summary for patient specific instructions.   Future Appointments  Date Time Provider Department Center  09/25/2023 10:30 AM Cottle, Steva Ready., MD [facility] None      No orders of the defined types were placed in this encounter.      -------------------------------

## 2023-09-02 ENCOUNTER — Telehealth: Payer: Self-pay

## 2023-09-02 NOTE — Telephone Encounter (Signed)
Confirmed procedure date of 09/04/2023. Confirmed arrival time of 0900 for procedure time at 1130. Reviewed pre-procedure instructions with patient's wife She understands to call if questions/concerns arise prior to procedure.  She was grateful for call and agreed with plan.

## 2023-09-03 ENCOUNTER — Telehealth: Payer: Self-pay

## 2023-09-03 ENCOUNTER — Encounter (HOSPITAL_COMMUNITY): Payer: Self-pay | Admitting: Cardiology

## 2023-09-03 NOTE — Telephone Encounter (Signed)
Due to inclement weather, [facility] wish to reschedule Mr. Casey [name] to 4/17. Procedure rescheduled. They understand they will be called to schedule repeat labs and update instructions soon. They were grateful for assistance.

## 2023-09-04 DIAGNOSIS — I4821 Permanent atrial fibrillation: Secondary | ICD-10-CM

## 2023-09-08 ENCOUNTER — Encounter: Payer: Self-pay | Admitting: Urology

## 2023-09-08 DIAGNOSIS — U071 COVID-19: Secondary | ICD-10-CM | POA: Diagnosis not present

## 2023-09-18 ENCOUNTER — Other Ambulatory Visit: Payer: HMO

## 2023-09-18 DIAGNOSIS — R972 Elevated prostate specific antigen [PSA]: Secondary | ICD-10-CM

## 2023-09-18 DIAGNOSIS — N3001 Acute cystitis with hematuria: Secondary | ICD-10-CM | POA: Diagnosis not present

## 2023-09-18 NOTE — Addendum Note (Signed)
 Addended by: Sarajane Jews on: 09/18/2023 01:13 PM   Modules accepted: Orders

## 2023-09-19 LAB — URINALYSIS, ROUTINE W REFLEX MICROSCOPIC
Bilirubin, UA: NEGATIVE
Glucose, UA: NEGATIVE
Ketones, UA: NEGATIVE
Nitrite, UA: NEGATIVE
RBC, UA: NEGATIVE
Specific Gravity, UA: 1.02 (ref 1.005–1.030)
Urobilinogen, Ur: 1 mg/dL (ref 0.2–1.0)
pH, UA: 6 (ref 5.0–7.5)

## 2023-09-19 LAB — MICROSCOPIC EXAMINATION

## 2023-09-19 LAB — PSA: Prostate Specific Ag, Serum: 8.8 ng/mL — ABNORMAL HIGH (ref 0.0–4.0)

## 2023-09-25 ENCOUNTER — Encounter: Payer: Self-pay | Admitting: Psychiatry

## 2023-09-25 ENCOUNTER — Ambulatory Visit (INDEPENDENT_AMBULATORY_CARE_PROVIDER_SITE_OTHER): Payer: Medicare Other | Admitting: Psychiatry

## 2023-09-25 DIAGNOSIS — Z8669 Personal history of other diseases of the nervous system and sense organs: Secondary | ICD-10-CM | POA: Diagnosis not present

## 2023-09-25 DIAGNOSIS — G4733 Obstructive sleep apnea (adult) (pediatric): Secondary | ICD-10-CM | POA: Diagnosis not present

## 2023-09-25 DIAGNOSIS — Z79899 Other long term (current) drug therapy: Secondary | ICD-10-CM | POA: Diagnosis not present

## 2023-09-25 DIAGNOSIS — F3132 Bipolar disorder, current episode depressed, moderate: Secondary | ICD-10-CM

## 2023-09-25 DIAGNOSIS — F5105 Insomnia due to other mental disorder: Secondary | ICD-10-CM | POA: Diagnosis not present

## 2023-09-25 DIAGNOSIS — G4710 Hypersomnia, unspecified: Secondary | ICD-10-CM

## 2023-09-25 DIAGNOSIS — F411 Generalized anxiety disorder: Secondary | ICD-10-CM | POA: Diagnosis not present

## 2023-09-25 DIAGNOSIS — I4821 Permanent atrial fibrillation: Secondary | ICD-10-CM

## 2023-09-25 NOTE — Progress Notes (Signed)
 Casey [name] [id number] 1946/05/25 78 y.o.    Subjective:   Patient ID:  Casey [name] is a 78 y.o. (DOB 26-Jan-1946) male.  Chief Complaint:  Chief Complaint  Patient presents with   Follow-up   Depression   Stress    Heart and lung issues    HPI  Casey [name] presents for follow-up of bipolar disorder and anxiety and history of confusion  At visit  Dec 02, 2018.  He was having confusion and benztropine was stopped to see if that was the cause.  Also it was having balance issues and therefore Trileptal was switched all to nighttime 1200 mg nightly. Confusion much better per both of them with DC benztropine.  Balance better with change Trileptal.   visit was January 13, 2019 and he was doing well as noted above and he was encouraged not to make further med changes at that time.  visit April 13, 2019.  He was doing better with regard to depression than he had in quite some time on the Wellbutrin and no meds were changed.  He was having health problems with pulmonary effusion and fluid removed twice related to fall in January 2020.  seen September 13, 2019.  For residual depression the following decisions were made: Balance problem much improved with dosing Trileptal all in the evening. Depression under partial control & wife wants it to be better per his wife.  No mood swings.   Increase bupropion XL to 2 of the 150 mg tablets in the morning until the current bottle is gone. Then new RX will be 1 of the 300 mg tablets each morning.  November 08, 2019 appointment the following is noted:  Seen with wife, [name]. Small amount of improvement with change.  Anxiety got worse.  Notices when he goes out.  Worries about social and political things.  Wife keeps him active at home.  Allerigies bother him..  No sig caffeine except tea when goes out.  Wife sees him as a little more active and initiative.  More productive.  Started some cooking.  Getting out and up better.  A little more talkative.    "He wants to be hyper but we know what happens". Wife [name] thinks   But for the last 2 years he's done nothing and laid around all the time.  He's a lot more active now. Big improvement. Went to church first time in 2 years. Plan:  For TRD, Pramipexole 0.25 mg tablet 1/2 twice daily for 1 week then 1 twice daily.  12/20/2019 appointment with the following noted:  Seen with wife. A little better motivated to get out but not to get in crowds.  Wife agrees more motivated and went to church once alone without pressure.  Wife says he's more talkative and much  Better interest.  Still not motivated to do things like mow the grass.  Others's have seen a difference. Concentration and memory are improved but not normal. No Se pramipexole.  Getting out of bed earlier and better now.. Wife admisters meds bc earlier he messsed them up. Plan: Wife has seen a big improvement therefore Partial improvement is clear from this so increase Pramipexole 0.375 mg tablet  twice daily  Anxiety is not fully managed.  Partly related to depression. Tremor is manageable.    02/15/20 appt with the following noted:  Send with wife [name] Doing good.  Increased pramipexole as indicated.  Tolerating meds Wellbutrin XL 300 mg AM & Trileptal 1200 mg  HS with it. Much better with depression.  Stays up more and speinding less time in bed.   Wife agrees he's much better and handling things better. 2 1/2 yo 4th cousin girl drowned in pond lately.  Big family. Wife pleased he handled it better. SE constipation ? Related. History Linzess but diarrhea. He's not highly motivated but it is better.  Depression is much improved.  Had been depressed for years. Plan no med changes  06/13/2020 appointment with the following noted: Pretty good but cardiologist noted today increased pulse needing treatment.  Uncontrolled afib even after ablation. Hard to tell about depression bc of fatigue Dt heart problems. Wife said he did well  Thanksgiving and participated more in conversation per wife. Plan: no med changes  09/06/2020 appointment with following noted: Remains on Wellbutrin XL 300 mg every morning, ginkgo biloba 40 mg twice daily, oxcarbazepine 1200 mg nightly, pramipexole 0.75 mg twice daily. Less sig health concerns. He thinks mood has been pretty good.  Consistent with meds. Wife thinks he stays tired and sleepy and wonders if can give him a stimulant.   Sleep good.  Will nap.  Doesn't bother him.  Walks dog 20 min in AM.  Not much initiative per wife.  Not depressed but when depressed he stayed in the bed lasting a couple of years.  History of OSA but lost weight and doesn't think he has apnea anymore.  No CPAP. Chronic afib. Not much caffeine. Plan: Wife& patient has seen a big improvement with Pramipexole 0.375 mg tablet  twice daily They ask consideration be given to the following: If his energy gets better by treating his tachycardia they would like to consider reducing the pramipexole or possibly eliminating it at follow-up.   For energy trial reduction pramipexole to 0.25 mg BID.  If gets more depressed call. Alternative of modafinil 100  12/04/2020 appointment with the following noted: Energy is better with awakening earlier at 8 and doing more and getting out more.  Going to bed later with 8 hour  Sleep. Mood fluctuates with more reacting to wife mainly.  More outspoken.  Per W he has to vent.  She thinks he is too focused on politics and issues.   Plan: Depression under control.  No mood swings.   Continue bupropion XL 300 mg tablets each morning. Continue Trileptal 600 mg 2 at HS. No higher DT anxiety risk and no traditional stimulants DT afib.  Wife & patient has seen a big improvement with Pramipexole and able to reduce to  0.25 mg tablet  twice daily  02/20/2021 appointment with the following noted:  seen with wife Prednisone made him real hyper.  Been off of it for at least a couple weeks.   Hyperverbal and hyperactive.  Hip pain interferes with sleep.  Sees doctor tomorrow. Average 3-4 hours at night and no naps bc busy.  No excessive spending but more than usual.  Denies appetite disturbance.  Patient reports that energy and motivation have been reduced.  Patient has difficulty with concentration and memory.  Patient denies any suicidal ideation.  W had MI in June. A/P: mild mania Wean Wellbutrin over 2 weeks. Continue Trileptal 600 mg 2 at HS. Wife & patient has seen a big improvement with Pramipexole and able to reduce to  0.25 mg tablet  twice daily. Continue it unless mania does not resolve with less bupropion.  03/26/2021 phone call: [name] stated Casey [name] has slightly improved since last week.He has not been able to  sleep and was up at 2 am texting people in a disoriented state.His pcp put him on Ambien 10 mg and this has helped him sleep.However,linda stated he is still working him self too much.She said he is jittery and has to have something to do at all times.He does not want to slow down or get rest.She thinks he needs to cutback on the Mirapex as well. MD response:  I agree with his wife about reducing Mirapex from 1 tablet twice a day to half tablet twice a day for 1 week and if his symptoms are not resolved and stop the Mirapex.  04/18/2021 phone call with wife and MD: Patient is still manic after stopping Wellbutrin and pramipexole.  Wife's concern is hard to get him to settle down at night.  Reviewed with wife prior psychiatric medications which are multiple.  Typically one would use an antipsychotic in this case but she does not think that they worked in the past.  She says he has been on higher doses of Trileptal and tolerated it. Increase Trileptal to 600 mg every morning and 1200 mg nightly for mania.  Keep scheduled appointment.  05/02/2021 wife reports patient still manic: We cannot go higher in the Trileptal because of balance issues.  Therefore olanzapine 10 mg nightly  was started and we discussed the option of hospitalization.  We will try to get the patient in urgently to an appointment.  05/07/2021 appointment with the following noted: seen with wife, [name] Not doing too well.  Trouble getting out of bed.  Wife says he's still up in middle of the night but he says he's sleeping better.  Sleep in recliner downstairs. She says he's still manic with hyperactive, hyper graphia but won't show her what he's writing. On olanzapine  No changes off Wellbutrin and pramipexole. Apparently more balance problems with increase Trileptal to 1800 mg daily.  Dropped back to 1200 mg HS. Admits to some confusion.  Has pulled off labels on bottle per wife. She thinks he's averaging 4-6 hours of sleep and a little better than it was. Wife said he started cussing and he has not done that in the past. Other episodes of inappropriate decision making.  He recognizes he's manic but can't control himself.  Hyperverbal, pressured.  In past had speech problems with mania also.  Neg neuro work up for stroke in the past. SE balance and tremor. Some trouble getting out of chair? Plan: For mania,Increase olanzapine to 15 mg PM Follow-up Friday for urgent visit  05/11/2021 appointment with the following noted: Calming down a little. He thinks he's sleeping better.  She says he needs whole Ambien with olanzapine and last night still not sleeping.  He woke her with complaints of back pain. She's not seeing SE and nor is he.  She's not seeing much change  He says back pain is complicating his sleep and his problem. He tries to stay active.   She says he talks incessantly but not as much to her. She says major life events have triggered mania in him in the past. She notices him making poor decisions. Plan: Increase olanzapine to 20 mg PM  05/24/2021 appointment with the following noted: with D Gunnar Fusi A week ago went of to doctor's office alone and wife called bc he was agitated. Per wife  still going wide open but is sleeping better. Yesterday had fever and talking out of his head and dx UTI. He still feels driven to do chores but distractible  and inefficient.   D does not see improvement and nor does wife.   7 hour sleep lately. Plan: Increase olanzapine to 30 mg in evening Reduce oxcarbazepine to 1 and 1/2 tablets in the evening.  06/04/2021 appointment: Seen with wife Sleeping more 7-9 hours.   Per wife still some confusion and still short tempered.  Less pressured sleep. He won't go to bed when sleepy and waits too long to get in bed and is weak and cannot do it by himself. Wife agrees he's less "wide open" than before and will be able to sit and watch TV show now. B died yesterday. Plan: Continue olanzapine to 30 mg in evening Reduce oxcarbazepine  again to 1 tablets in the evening for 1 week, then 1/2 tablet at night for a week then stop it.  07/04/21 appt noted:  seen with wife [name] Hyper-ness is better.  Some trouble with concentration but able to make peanut butter fudge. Problems with afib.   Sleeping a lot and some instability.  Goes to sleep in chair.  Better with irritability.  No longer talking constantly but still some in spells.   Not depressed.   No change in balance off oxcarbazepine. Wife said last week mixed up meds and got oversedated.  She discovered the option. Plan: Reduce olanzapine to 20 mg PM now that mania is largely resolved.  08/06/2021 appointment with the following noted: Only psych med is reduced olanzapine 20 mg HS RLS and fidgety.  All day.  Can sit for an hour.  Uncomfortable when sitting. Stopped Ginko and wife noticed RLS more.  She restarted it.  No longer rocking back and forth. Caffeine tea at lunch.  No coffee..  [place] some. RLS recently started. Bipolar sx great per wife.  Not wanting to go out much. CO tiredness.  Falls asleep in recliner.  Sleep all night. Eats sweets all his life. Plan: No longer manic. Reduce  olanzapine to 15 mg earlier in PM Stop caffeine  09/06/2021 appointment with the following noted: Reduced olanzapine 15 mg and still RLS all the day. No change in that.  But can sit in church ok.  Will move legs when standing and talking to people. Sleeping more than he should from 10 until 9. And still naps and little initiative bc tired. Not depressed but not back to normal.  Not as social as usual. Loves to eat out. Plan: Reduce olanzapine from 15 to 7.5 mg earlier in PM Stop caffeine  10/02/2021 appointment with the following noted: seen again with wife [name] Only psych med is the reduced dose of olanzapine 7.5 mg for 30 days. Back pain limits activity and getting shots. Taking tramadol for pain.  1-2 prn.  One helps. 2 is sedating. RLS is better and mainly now the problem is back pain.  Has to sleep in recliner DT pain.   [name] says hard to tell about effect from reduced olanzapine bc he's having such back pain and taking tramadol.  Doesn't feel like doing anything. Plan continue olanzapine 7.5 mg every afternoon  12/04/2021 appointment with the following noted: seen with wife Still doing well with olanzapine 7.5 mg PM No SE.  RLS is OK now. Sleep good 8-10 hours. Not depressed nor manic sx.   Happy with meds.   Wife gall bladder surgery. H helping wife. Plan: olanzapine 7.5 mg earlier in PM Stopcaffeine   03/19/2022 phone call: Wife called complaining that her husband sleeping 14 to 15 hours a day on  olanzapine 7.5 mg daily and wondering about trying to reduce it. MD response: Olanzapine is his only mood stabilizer at present.  There is a risk that he could get manic again with the dosage reduction.  But we can try reducing from 7.5 to 5 mg at night.  That may or may not help with his tendency to sleep excessively.  Some people will do that even on low doses of olanzapine.  Please send in prescription for olanzapine 5 mg tablets 1 nightly #30, 1 additional refill and make a note in  the pharmacy section of the order to cancel any other olanzapine refills.     06/04/2022 appointment noted:  wife says a tad better with less olanzapine but still wants to sleep and sit around a lot. Keeps saying I'm not depressed.   Reduced olanzapine 5 mg HS. No mania or depression. Hx sleep study and dx OSA but per wife got better with wt loss.  Not using CPAP. Eating less but wt loss stable. Asks about seasonal pattern bc of the history.  She can't remember the seasonal pattern.   Plan: Reduce  DT sleepiness olanzapine 2.5 mg earlier in PM Mild TD   07/29/22 TC: no changes with reduction so increased back to 5 mg olanzapine.  08/14/22 appt noted: Still sleeping too much 12 hours.   Also been irritable with flashes.   Something can set him off. Probably getting depressed some with less olanzapine.  Some anxiety.   Some reduction in interest and not going to church like before and doesn't like to be around people.  Doesn't want wife to help others either.   No restlessness.  Will nap in the afternoon. Not enjoying things and not laughing. Plan: Is getting more depressed again, resume Wellbutrin XL up to 300 mg AM  10/14/22 appt noted:  with wife [name] Psych Med: olanzapine 5 HS, Wellbutrin 300 mg AM Feels a little more like getting out but wife says not much.  She sees him being a little more agitated and irritable.  Still doesn't want to go out normally or be with other people.  Usual self is social.  Don't talk as much.  Doesn't want to be alone and doesn't want wife to be social. Sleeps a lot.  Partly DT back pain. Not nervous or anxious. Plan: Is having apathetic depressed again,  and failed Wellbutrin.   Resume pramipexole off label which worked before,  0.25 mg BID for 4 days then 0.375 mg BID  10/23/22 TC: still dep and wanting to increase pramipexole.  MD resp: incr to 0.5 mg BID  11/24/22 and neuro FU 01/08/23 noted:  follow-up for acute right frontal juxtacortical infarct  most slightly due to A-fib due to noncompliance with Xarelto.  Patient is here with his wife today.  He feels that he has returned to his baseline.   02/27/23 appt noted:  with [name] Since stroke not back to church.  No energy and not wanting to do anything.  Sleep 12 hours and watches TV and recliner.  Wife says sleep excess no change since stroke.  Still dep.   Still wanting to isolate from others. No anxiety. Plan: Stop olanzapine Start Vraylar 1 in the AM Reduce pramipexole to 1 twice daily for 4 days then 1/2 twice daily for 4 days then stop it. This plan will reduce meds and answer question about sleepiness from olanzapine and reduce polypharmacy  03/18/23 TC" Wife reporting no improvement in depression and he is  still sleeping all the time. She is also reporting it is too expensive and they would not be able to afford it, but PA has not been done. Requesting to go back to previous medications.  CC resp: Me  to Franchot Erichsen, CMA  CC   03/19/23  6:39 PM Note It has not been long enough and I need to see him to find an alternative to Vraylar.  Stay on the Vraylar 1.5 mg daily until his appt later this month.  I don't want to resume olanzapine which didn't work until we explore other options.     04/10/23 appt noted: seen with wife Casey [name] meds: Vraylar 1.5 mg daily.  No olanzapine, pramipexole stopped. Sleeping about the same but easier to get up per wife on Vraylar.  No change in mood.  Still feels dep.  Not wanting to go out and be around people.  Without change.  No social life.  More HA.   Still dealing with chronic TR afib and tx ongoing. Easily out of breath.   Will walk dog 10 mins.   Tires him.  Sleeps a lot during the day. Hx OSA but since lost wt not needing CPAP Doesn't laugh much. Not going to church DT dep. Worry a lot without change.  Will resolve when not dep.   Plan: Stop Vraylar Start 3 of the 150 mg capsules of lithium at night Get a blood level in the morning  5-7 days later Use modafinil for alertness if needed  05/26/23 appt noted:  seen with D Gunnar Fusi Meds: lithium 450 HS, no Vraylar, modafinil prn.   Lithium level 0.4, Cr 1.34 pretty stable. Mood kind of OK.  Sleep is still too much.   Wife's notes no big change but is awake more than he was.  Hard to get OOB before 11.   EKG per card was normal QT but history "permanent atrial fibrillation" with hx stroke DT unintentional noncompliance with anticoagulation.  Plan:  lithium 450 HS.  Lithium 0.4.   Option modafinil if not better in a couple of weeks.  Resume pramipexole increase, to 0.375 mg BID off label for depression.  06/24/23 appt noted:  seen with wife, Casey [name] meds: Lithium 450 nightly, pramipexole 0.25 mg tablets 1-1/2 twice daily, no modafinil 100 mg every morning About the same but a little better.  Getting out and going more . Per wife seems some better but she doesn't think he's doing much  and not as much as normal.  Not sig more social and not normal for  him. No sig SE.   Physically is moving better.   To bed 9 and will get up at 930 am instead of 11.   Pramipexole helped a little. Mild stroke in May.  Will have Watchman device placed. Plan: For TRD increase pramipexole to 0.5 mg BID and then to previous dose of 0.75 mg BID  07/29/23 appt noted:  with wife Meds: incr pramipexole to 0.75 mg BID, lithium 450 mg daily,  Per wife: "Absolutely"  re: improvement per wife.  Getting out more and more wsocial and motivated.  More household chores. He noticed "I'm just myself".  Talking more.  More interested.  Dep resolved. Noticed benefit gradually over a couple of weeks. More involuntary tongue movements  09/01/23 appt noted: with wife [name] Meds: as above. A little more active and getting out more and less depressed and more interested. SE tremor occ &  Tongue movements about the same  and when nervous is worse. Sleep well.  Wife says dep much better.  Some laughter occ which  hasn't been there.  She doesn't feel he is all the way well.  Still likes to sit around a lot. Not as consisstent with Xarelto as he should be.  Had 2-3 ministrokes.  Pending surgery to keep that from being necessary. Plan incr pramipexole 1 mg BID  09/25/23 appt noted: Med: incr pramipexole 1 mg BID, lithium 15 mg 3 daily, modafinil 100 AM,  A little bit better.  Health concerns with ministrokes.  Pending Watchman insert for perm a fib.  W notes he's talking more and a little more active but still not motivated to go out much and doesn't want her to go out much.  He doesn't want to go out much until his health is better with breathing.   Not really dep but kind of down with health issues dragging out and work up dragging.  Worrying over health.   Past Psychiatric Medication Trials: Seroquel 300,  olanzapine 30 resolved mania,  (Improved  with Increase olanzapine to 30 mg PM and off the oxcarbazepine.) perphenazine, risperidone,  Latuda,   Abilify, Vraylar 1.5 mg for 1 month without benefit Lithium did great helped but SE afib, kidneys and prostate,    alprazolam,  Depakote, Trileptal 1800 balance problems  carbamazepine, lamotrigine caused nausea,  citalopram, sertraline 150,    Wellbutrin 300 anxiety and irritability and failed 10/2022,  Pramipexole 0.375 BID helped Benztropine confusion        1 prior psych hosp depression                                                    Review of Systems:  Review of Systems  Constitutional:  Positive for fatigue.  HENT:         Clicking teeth  Eyes:  Negative for visual disturbance.  Respiratory:  Positive for shortness of breath. Negative for cough.   Gastrointestinal:  Positive for constipation.  Musculoskeletal:  Positive for arthralgias, back pain and gait problem.  Skin:  Negative for rash.       chronic  Neurological:  Positive for tremors and weakness.       Balance px longterm no worse More tongue movements   Psychiatric/Behavioral:  Negative for agitation, behavioral problems, confusion, decreased concentration, dysphoric mood, hallucinations, self-injury, sleep disturbance and suicidal ideas. The patient is not nervous/anxious and is not hyperactive.     Medications: I have reviewed the patient's current medications.  Current Outpatient Medications  Medication Sig Dispense Refill   acetaminophen (TYLENOL) 500 MG tablet Take 1,000 mg by mouth every 6 (six) hours as needed for headache.     albuterol (VENTOLIN HFA) 108 (90 Base) MCG/ACT inhaler Inhale 2 puffs into the lungs every 6 (six) hours as needed (Asthma).     Cholecalciferol (VITAMIN D3) 250 MCG (10000 UT) capsule Take 10,000 Units by mouth daily.     levothyroxine (SYNTHROID, LEVOTHROID) 25 MCG tablet Take 25 mcg by mouth daily before breakfast.     lithium carbonate 150 MG capsule TAKE 3 CAPSULES BY MOUTH AT BEDTIME 90 capsule 0   metoprolol succinate (TOPROL-XL) 25 MG 24 hr tablet Take 1 tablet by mouth once daily 90 tablet 3   pantoprazole (PROTONIX) 40 MG tablet TAKE ONE TABLET BY MOUTH ONCE  DAILY (Patient taking differently: Take 40 mg by mouth daily.) 30 tablet 11   pramipexole (MIRAPEX) 1 MG tablet Take 1 tablet (1 mg total) by mouth 2 (two) times daily. 180 tablet 0   rivaroxaban (XARELTO) 20 MG TABS tablet Take 20 mg by mouth daily after supper.      rosuvastatin (CRESTOR) 20 MG tablet Take 1 tablet (20 mg total) by mouth daily. 30 tablet 0   SUPER B COMPLEX/C PO Take 1 tablet by mouth daily.     No current facility-administered medications for this visit.    Medication Side Effects: resolved  Allergies:  Allergies  Allergen Reactions   Amlodipine Other (See Comments)    Stopped due to kidney issues   Vicodin [Hydrocodone-Acetaminophen] Other (See Comments)    Patient is unsure of reaction    Past Medical History:  Diagnosis Date   Arthritis    Asthma    Atrial fibrillation (HCC) 01/12/2010   2D Echo EF=>55%    Bipolar disorder (HCC)    Chronic back pain    Depression    Dysrhythmia    AFib   GERD (gastroesophageal reflux disease)    History of colonic polyps    History of gout    Hyperlipidemia    Hypertension    Hypothyroidism    Morbid obesity (HCC)    OSA (obstructive sleep apnea)    AHI was 27.62hr RDI was 34.3 hr REM 0.00hr; had CPAP years ago but no longer uses.   Prostatitis    Tubular adenoma of colon 01/2016    Family History  Problem Relation Age of Onset   Diabetes Mother    Heart failure Mother    Hypertension Mother    Hypertension Father    Cancer Paternal Grandmother    Stroke Sister    Liver disease Brother    Vascular Disease Brother    Arthritis Brother    Colon cancer Neg Hx     Social History   Socioeconomic History   Marital status: Married    Spouse name: Not on file   Number of children: Not on file   Years of education: Not on file   Highest education level: Not on file  Occupational History   Not on file  Tobacco Use   Smoking status: Never   Smokeless tobacco: Never  Vaping Use   Vaping status: Never Used  Substance and Sexual Activity   Alcohol use: No    Alcohol/week: 0.0 standard drinks of alcohol   Drug use: No   Sexual activity: Yes    Birth control/protection: None  Other Topics Concern   Not on file  Social History Narrative   Not on file   Social Drivers of Health   Financial Resource Strain: Not on file  Food Insecurity: No Food Insecurity (11/24/2022)   Hunger Vital Sign    Worried About Running Out of Food in the Last Year: Never true    Ran Out of Food in the Last Year: Never true  Transportation Needs: No Transportation Needs (11/24/2022)   PRAPARE - Administrator, Civil Service (Medical): No    Lack of Transportation (Non-Medical): No  Physical Activity: Not on file  Stress: Not on file  Social Connections: Not on file  Intimate Partner Violence: Patient Declined (11/24/2022)   Humiliation, Afraid,  Rape, and Kick questionnaire    Fear of Current or Ex-Partner: Patient declined    Emotionally Abused: Patient declined    Physically Abused: Patient  declined    Sexually Abused: Patient declined    Past Medical History, Surgical history, Social history, and Family history were reviewed and updated as appropriate.   Please see review of systems for further details on the patient's review from today.   Objective:   Physical Exam:  There were no vitals taken for this visit.  Physical Exam Constitutional:      General: He is not in acute distress. Musculoskeletal:        General: No deformity.  Neurological:     Mental Status: He is alert and oriented to person, place, and time.     Coordination: Coordination normal.     Gait: Gait normal.     Comments: Mild rocking motions legs resolved Mild + lip licking and buccal movements ongoing  Psychiatric:        Attention and Perception: He is attentive. He does not perceive auditory hallucinations.        Mood and Affect: Mood is depressed. Mood is not anxious. Affect is not labile, blunt, angry or inappropriate.        Speech: Speech is not rapid and pressured or tangential.        Behavior: Behavior normal. Behavior is not agitated.        Thought Content: Thought content normal. Thought content is not delusional. Thought content does not include homicidal or suicidal ideation. Thought content does not include suicidal plan.        Cognition and Memory: Cognition normal.        Judgment: Judgment normal.     Comments: Insight intact. No auditory or visual hallucinations. No delusions.  Mild dep over health Gait is normal again. Lip smacking worse with pramipexole More talkative , engaged, interested, not as blunted     Lab Review:     Component Value Date/Time   NA 144 08/22/2023 1229   K 4.3 08/22/2023 1229   CL 106 08/22/2023 1229   CO2 24 08/22/2023 1229   GLUCOSE 90 08/22/2023 1229   GLUCOSE 104 (H) 12/29/2022 0443    BUN 16 08/22/2023 1229   CREATININE 1.51 (H) 08/22/2023 1229   CALCIUM 9.5 08/22/2023 1229   PROT 7.0 11/24/2022 1252   PROT 6.6 12/28/2018 0941   ALBUMIN 3.9 11/24/2022 1252   ALBUMIN 4.5 12/28/2018 0941   AST 18 11/24/2022 1252   ALT 13 11/24/2022 1252   ALKPHOS 70 11/24/2022 1252   BILITOT 0.8 11/24/2022 1252   BILITOT 0.7 12/28/2018 0941   GFRNONAA >60 12/29/2022 0443   GFRAA 63 12/28/2018 0941       Component Value Date/Time   WBC 7.7 08/22/2023 1229   WBC 5.7 12/29/2022 0443   RBC 4.23 08/22/2023 1229   RBC 4.23 12/29/2022 0443   HGB 13.6 08/22/2023 1229   HCT 41.6 08/22/2023 1229   PLT 181 08/22/2023 1229   MCV 98 (H) 08/22/2023 1229   MCH 32.2 08/22/2023 1229   MCH 31.9 12/29/2022 0443   MCHC 32.7 08/22/2023 1229   MCHC 33.7 12/29/2022 0443   RDW 12.4 08/22/2023 1229   LYMPHSABS 0.7 12/29/2022 0443   LYMPHSABS 1.1 12/28/2018 0941   MONOABS 0.5 12/29/2022 0443   EOSABS 0.2 12/29/2022 0443   EOSABS 0.1 12/28/2018 0941   BASOSABS 0.0 12/29/2022 0443   BASOSABS 0.0 12/28/2018 0941    Lithium Lvl  Date Value Ref Range Status  04/22/2023 0.4 (L) 0.5 - 1.2 mmol/L Final    Comment:    A concentration of 0.5-0.8  mmol/L is advised for long-term use; concentrations of up to 1.2 mmol/L may be necessary during acute treatment.                                  Detection Limit = 0.1                           <0.1 indicates None Detected      No results found for: "PHENYTOIN", "PHENOBARB", "VALPROATE", "CBMZ"   .res Assessment: Plan:    Jahon was seen today for follow-up, depression and stress.  Diagnoses and all orders for this visit:  Bipolar affective disorder, currently depressed, moderate (HCC)  Generalized anxiety disorder  Hypersomnolence disorder, acute, moderate  Obstructive sleep apnea syndrome  Lithium use  History of obstructive sleep apnea  Insomnia due to mental condition   Likely TD   30 min face to face time with patient was spent  on counseling and coordination of care. We discussed Dx bipolar at 78 yo.  Bipolar depression has recurred again like last year but so far is not as severe.  Patient is very complicated because of weakness and medical problems.   History pseudodementia with depression. Prednisone triggered some mania in 2023 and persisted off prednisone.    Dep better again with pramipexole 1 mg BID.  Mild lip smacking consistent with TD and appears worse with addtion of prampexole..   But is not disturbing to pt nor his wife.  If still a problem with sleepiness DT multiple med failures consider modafinil to manage.  Cr is abnormal.  Failed multiple alternatives.  Disc risks lithium but it is what worked best.  Will try LED DT medical problems including chronic afib. Counseled patient regarding potential benefits, risks, and side effects of lithium to include potential risk of lithium affecting thyroid and renal function.  Discussed need for periodic lab monitoring to determine drug level and to assess for potential adverse effects.  Counseled patient regarding signs and symptoms of lithium toxicity and advised that they notify office immediately or seek urgent medical attention if experiencing these signs and symptoms.  Patient advised to contact office with any questions or concerns.  h lithium 450 HS.   Lithium 0.4.  04/22/23. CR 1.45 on 05/26/23.  1.51 on 08/22/23 Cr 1.20 Jul 2023.  Disc this concern at length.  Might have to change again if progresses further. 07/01/23 Lithium 0.5.  no dosage change.  Had tremor at higher doses  Option modafinil if not better .  defer Normal Qtc on EKG since here but afib.  Marked improvement dep For TRD after increase pramipexole to 1mg  BID but not quite back to normal. Defer further changes until immed med concerns settled. Disc SE including manic risk, but dep interfering with tx  Follow-up 2 mos after April doctor appts  Meredith Staggers, MD, DFAPA   Please see After  Visit Summary for patient specific instructions.   Future Appointments  Date Time Provider Department Center  10/14/2023 10:30 AM Nyoka Cowden, MD [facility] None  10/28/2023 10:30 AM Cottle, Steva Ready., MD [facility] None  11/25/2023  3:30 PM Marcine Matar, MD [facility] None      No orders of the defined types were placed in this encounter.      -------------------------------

## 2023-10-07 ENCOUNTER — Ambulatory Visit (HOSPITAL_COMMUNITY)
Admission: RE | Admit: 2023-10-07 | Discharge: 2023-10-07 | Disposition: A | Discharge status: Home / Self Care | Source: Ambulatory Visit | Attending: Internal Medicine | Admitting: Internal Medicine

## 2023-10-07 ENCOUNTER — Encounter: Payer: Self-pay | Admitting: Internal Medicine

## 2023-10-07 ENCOUNTER — Ambulatory Visit: Admitting: Internal Medicine

## 2023-10-07 VITALS — BP 109/69 | HR 86 | Ht 72.0 in | Wt 200.8 lb

## 2023-10-07 DIAGNOSIS — J90 Pleural effusion, not elsewhere classified: Secondary | ICD-10-CM

## 2023-10-07 DIAGNOSIS — R0609 Other forms of dyspnea: Secondary | ICD-10-CM | POA: Diagnosis not present

## 2023-10-07 DIAGNOSIS — J929 Pleural plaque without asbestos: Secondary | ICD-10-CM | POA: Diagnosis not present

## 2023-10-07 DIAGNOSIS — R0602 Shortness of breath: Secondary | ICD-10-CM | POA: Diagnosis not present

## 2023-10-07 DIAGNOSIS — U071 COVID-19: Secondary | ICD-10-CM | POA: Diagnosis not present

## 2023-10-07 NOTE — Patient Instructions (Addendum)
 Please remember to go to the  x-ray department  for your tests - we will call you with the results when they are available    I do not see a reason he can't have the procedure planned   My office will be contacting you by phone for referral to PFTs   - if you don't hear back from my office within one week please call us back or notify us thru MyChart and we'll address it right away.  I will call you with results and go from there.

## 2023-10-07 NOTE — Progress Notes (Unsigned)
 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 self referred to pulmonary clinic in [facility]  10/07/2023 for calcifcations L Hemithorax s/p L thoracentesis  03/27/2019      History of Present Illness  10/07/2023  Pulmonary/ 1st office eval/ Mina Babula / [place] [facility]  Chief Complaint  Patient presents with   Consult  Dyspnea:  2 aisles slow at [employer] / [employer] /  Cough: assoc with pnd but it resolved dx with covid fl Sleep: flat bed one pillow  SABA use: none  02: none     No obvious day to day or daytime pattern/variability or assoc excess/ purulent sputum or mucus plugs or hemoptysis or cp or chest tightness, subjective wheeze or overt sinus or hb symptoms.    Also denies any obvious fluctuation of symptoms with weather or environmental changes or other aggravating or alleviating factors except as outlined above   No unusual exposure hx or h/o childhood pna/ asthma or knowledge of premature birth.  Current Allergies, Complete Past Medical History, Past Surgical History, Family History, and Social History were reviewed in [employer] record.  ROS  The following are not active complaints unless bolded Hoarseness, sore throat, dysphagia, dental problems, itching, sneezing,  nasal congestion or discharge of excess mucus or purulent secretions, ear ache,   fever, chills, sweats, unintended wt loss or wt gain, classically pleuritic or exertional cp,  orthopnea pnd or arm/hand swelling  or leg swelling, presyncope, palpitations, abdominal pain, anorexia, nausea, vomiting, diarrhea  or change in bowel habits or change in bladder habits, change in stools or change in urine, dysuria, hematuria,  rash, arthralgias, visual complaints, headache, numbness, weakness or ataxia or problems with walking or coordination/uses cane ,  change in mood or  memory.             Outpatient Medications Prior to Visit  Medication Sig Dispense Refill   acetaminophen (TYLENOL) 500 MG tablet Take 1,000 mg by mouth every 6 (six) hours as needed for headache.     albuterol (VENTOLIN HFA) 108 (90 Base) MCG/ACT inhaler Inhale 2 puffs into the lungs every 6 (six) hours as needed (Asthma).     Cholecalciferol (VITAMIN D3) 250 MCG (10000 UT) capsule Take 10,000 Units by mouth daily.     levothyroxine (SYNTHROID, LEVOTHROID) 25 MCG tablet Take 25 mcg by mouth daily before breakfast.     lithium carbonate 150 MG capsule TAKE 3 CAPSULES BY MOUTH AT BEDTIME 90 capsule 0   metoprolol succinate (TOPROL-XL) 25 MG 24 hr tablet Take 1 tablet by mouth once daily 90 tablet 3   pantoprazole (PROTONIX) 40 MG tablet TAKE ONE TABLET BY MOUTH ONCE DAILY (Patient taking differently: Take 40 mg by mouth daily.) 30 tablet 11   pramipexole (MIRAPEX) 1 MG tablet Take 1 tablet (1 mg total) by mouth 2 (two) times daily. 180 tablet 0   rivaroxaban (XARELTO) 20 MG TABS tablet Take 20 mg by mouth daily after supper.      rosuvastatin (CRESTOR) 20 MG tablet Take 1 tablet (20 mg total) by mouth daily. 30 tablet 0   SUPER B COMPLEX/C PO Take 1 tablet by mouth daily.     No facility-administered medications prior to visit.    Past Medical History:  Diagnosis Date   Arthritis    Asthma    Atrial fibrillation (HCC)  01/12/2010   2D Echo EF=>55%   Bipolar disorder (HCC)    Chronic back pain    Depression    Dysrhythmia    AFib   GERD (gastroesophageal reflux disease)    History of colonic polyps    History of gout    Hyperlipidemia    Hypertension    Hypothyroidism    Morbid obesity (HCC)    OSA (obstructive sleep apnea)    AHI was 27.62hr RDI was 34.3 hr REM 0.00hr; had CPAP years ago but no longer uses.   Prostatitis    Tubular adenoma of colon 01/2016      Objective:     BP 109/69   Pulse 86   Ht 6' (1.829 m)   Wt 200 lb 12.8 oz (91.1 kg)   SpO2 97% Comment: room air  BMI  27.23 kg/m   SpO2: 97 % (room air)  Stoic amb (with cane) wm nad    HEENT : Oropharynx  clear      Nasal turbinates nl    NECK :  without  apparent JVD/ palpable Nodes/TM    LUNGS: no acc muscle use,  Nl contour chest which is clear to A and P bilaterally without cough on insp or exp maneuvers   CV:  RRR  no s3 or murmur or increase in P2, and no edema   ABD:  soft and nontender   MS:  Gait  a bit unsteady/ with cane   ext warm without deformities Or obvious joint restrictions  calf tenderness, cyanosis or clubbing    SKIN: warm and dry without lesions    NEURO:  alert, approp, nl sensorium with  no motor or cerebellar deficits apparent.    CXR PA and Lateral:   10/07/2023 :    I personally reviewed images and impression is as follows:        Assessment   No problem-specific Assessment & Plan notes found for this encounter.     [name], MD 10/07/2023

## 2023-10-08 ENCOUNTER — Encounter: Payer: Self-pay | Admitting: Internal Medicine

## 2023-10-08 ENCOUNTER — Telehealth: Payer: Self-pay | Admitting: Internal Medicine

## 2023-10-08 DIAGNOSIS — J90 Pleural effusion, not elsewhere classified: Secondary | ICD-10-CM | POA: Insufficient documentation

## 2023-10-08 NOTE — Telephone Encounter (Signed)
 Spoke with Casey [name] regarding the Tuesday 11/11/23 8:30 am PFT appointment at [facility] time is 8:15 am---1st floor registration desk for check in----will mail appointment information and iinstructions to patient and she voiced her understanding

## 2023-10-08 NOTE — Assessment & Plan Note (Addendum)
 Onset ? Assoc with generalized weakness and fatigue  -  s/p L thoracentesis  02/2019 and 03/2019  >non specific exudate / neg cytology  -  CT chest 08/14/23 L pleural scarring/ rounded atx no change x 6 m so met radiographic dx of benignancy  -  10/07/2023   Walked on RA  x  3  lap(s) =  approx 450  ft  @ slow/ cane pace, stopped due to end of study  with lowest 02 sats 93% cc "tired"  no sob    Observation is more of a geriatric decline/ deconditioning rather than limiting doe in pt with scarring L pleura but not enough to create reproducible DOE or any obvious pulmonary symptoms at this point so no contraindication to procedure planned  Rec Baseline pfts when available/ no need for inhalers in the meantime   F/u prn abn pfts or worse doe.

## 2023-10-08 NOTE — Assessment & Plan Note (Addendum)
 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  No further radiographic f/u indicated  Each maintenance medication was reviewed in detail including emphasizing most importantly the difference between maintenance and prns and under what circumstances the prns are to be triggered using an action plan format where appropriate.  Total time for H and P, chart review, counseling,  directly observing portions of ambulatory 02 saturation study/ and generating customized AVS unique to this office visit / same day charting = 46 min new pt eval

## 2023-10-10 ENCOUNTER — Telehealth: Payer: Self-pay

## 2023-10-10 NOTE — Telephone Encounter (Signed)
 Whaaa?

## 2023-10-10 NOTE — Telephone Encounter (Signed)
 The patient's wife reported the patient wants to cancel his Watchman procedure scheduled 4/17. Thinking about a procedure is causing him so much anxiety he is having near panic attacks. Cancelled the procedure. Instructed her to call PCP if anxiety persists.  She will speak with her husband and call if he decides to proceed with LAAO in the future.

## 2023-10-14 ENCOUNTER — Institutional Professional Consult (permissible substitution): Payer: HMO | Admitting: Internal Medicine

## 2023-10-21 ENCOUNTER — Telehealth: Payer: Self-pay | Admitting: Internal Medicine

## 2023-10-21 NOTE — Telephone Encounter (Signed)
 Copied from CRM [id number]. Topic: Appointments - Appointment Info/Confirmation >> Oct 21, 2023  2:36 PM Renie Ora wrote: Patient wife [name] called to inquire if patient needs to keep his appointment for PFT scheduled for April 29th since the patient recent chest xray results were fine per Dr.Wert. Please follow up with patient wife [name].

## 2023-10-22 NOTE — Telephone Encounter (Signed)
 Spoke with patients wife Casey [name]. Informed her that yes, they do need to keep the appointment for PFT. A follow up visit was also needed to review results.   Appointment scheduled for 12/02/23

## 2023-10-28 ENCOUNTER — Ambulatory Visit: Payer: HMO | Admitting: Psychiatry

## 2023-10-30 ENCOUNTER — Inpatient Hospital Stay (HOSPITAL_COMMUNITY): Admission: RE | Admit: 2023-10-30 | Payer: HMO | Source: Home / Self Care | Admitting: Cardiology

## 2023-10-30 ENCOUNTER — Encounter (HOSPITAL_COMMUNITY): Admission: RE | Payer: HMO | Source: Home / Self Care

## 2023-10-30 DIAGNOSIS — I4821 Permanent atrial fibrillation: Secondary | ICD-10-CM

## 2023-10-30 SURGERY — LEFT ATRIAL APPENDAGE OCCLUSION
Anesthesia: General

## 2023-11-11 ENCOUNTER — Ambulatory Visit (HOSPITAL_COMMUNITY)
Admission: RE | Admit: 2023-11-11 | Discharge: 2023-11-11 | Disposition: A | Discharge status: Home / Self Care | Source: Ambulatory Visit | Attending: Internal Medicine | Admitting: Internal Medicine

## 2023-11-11 DIAGNOSIS — R0609 Other forms of dyspnea: Secondary | ICD-10-CM | POA: Diagnosis not present

## 2023-11-11 LAB — PULMONARY FUNCTION TEST
DL/VA % pred: 118 %
DL/VA: 4.57 ml/min/mmHg/L
DLCO unc % pred: 93 %
DLCO unc: 24.65 ml/min/mmHg
FEF 25-75 Post: 2.17 L/s
FEF 25-75 Pre: 1.41 L/s
FEF2575-%Change-Post: 53 %
FEF2575-%Pred-Post: 91 %
FEF2575-%Pred-Pre: 59 %
FEV1-%Change-Post: 13 %
FEV1-%Pred-Post: 80 %
FEV1-%Pred-Pre: 71 %
FEV1-Post: 2.65 L
FEV1-Pre: 2.33 L
FEV1FVC-%Change-Post: 8 %
FEV1FVC-%Pred-Pre: 93 %
FEV6-%Change-Post: 4 %
FEV6-%Pred-Post: 83 %
FEV6-%Pred-Pre: 79 %
FEV6-Post: 3.58 L
FEV6-Pre: 3.41 L
FEV6FVC-%Change-Post: 0 %
FEV6FVC-%Pred-Post: 105 %
FEV6FVC-%Pred-Pre: 104 %
FVC-%Change-Post: 4 %
FVC-%Pred-Post: 79 %
FVC-%Pred-Pre: 76 %
FVC-Post: 3.61 L
FVC-Pre: 3.45 L
Post FEV1/FVC ratio: 73 %
Post FEV6/FVC ratio: 99 %
Pre FEV1/FVC ratio: 68 %
Pre FEV6/FVC Ratio: 99 %
RV % pred: 115 %
RV: 3.09 L
TLC % pred: 89 %
TLC: 6.68 L

## 2023-11-11 MED ORDER — ALBUTEROL SULFATE (2.5 MG/3ML) 0.083% IN NEBU
2.5000 mg | INHALATION_SOLUTION | Freq: Once | RESPIRATORY_TRACT | Status: AC
  Administered 2023-11-11: 2.5 mg via RESPIRATORY_TRACT

## 2023-11-24 ENCOUNTER — Other Ambulatory Visit: Payer: Self-pay | Admitting: Psychiatry

## 2023-11-24 DIAGNOSIS — F3132 Bipolar disorder, current episode depressed, moderate: Secondary | ICD-10-CM

## 2023-11-24 NOTE — Progress Notes (Incomplete)
 11/25/2023 12:34 PM   Casey [name] 17-Jun-1946 [id number]  Referring provider: Minus Amel, MD [address] [place],  [place] [address]  No chief complaint on file.   HPI: This man comes in today for follow-up of his initial visit in January 2025.  Several issues were noted:  At that time he had an elevated PSA of 6.5.  Baseline PSA 4 years earlier was 2.1.  He was noted to have pyuria, however.  He also had microscopic hematuria with possible UTI.  Urine culture did grow Enterococcus, this was treated.  Follow-up urinalysis approximately 2 months after initial visit, after antibiotic management, revealed no microscopic hematuria.  He had a history of meatal stenosis.  He had been dilating occasionally.  I recommended for him to perform meatal dilation every 2 to 3 weeks.  Follow-up PSA was 8.8.     PMH: Past Medical History:  Diagnosis Date   Arthritis    Asthma    Atrial fibrillation (HCC) 01/12/2010   2D Echo EF=>55%   Bipolar disorder (HCC)    Chronic back pain    Depression    Dysrhythmia    AFib   GERD (gastroesophageal reflux disease)    History of colonic polyps    History of gout    Hyperlipidemia    Hypertension    Hypothyroidism    Morbid obesity (HCC)    OSA (obstructive sleep apnea)    AHI was 27.62hr RDI was 34.3 hr REM 0.00hr; had CPAP years ago but no longer uses.   Prostatitis    Tubular adenoma of colon 01/2016    Surgical History: Past Surgical History:  Procedure Laterality Date   APPENDECTOMY  1959   asthma     CATARACT EXTRACTION Bilateral 1995   COLONOSCOPY  2011   RMR: left-sided diverticula, minimal rectal friability. No polyps. Repeat 5 years.   COLONOSCOPY WITH PROPOFOL  N/A 01/15/2016   Dr.Rourk- diverticulosis in the entire examined colon, multiple rectal and colonic polyps, internal hemorrhoids. bx= tubular adenomas and hyperplastic polyps.    COLONOSCOPY WITH PROPOFOL  N/A 03/13/2022   Procedure: COLONOSCOPY WITH  PROPOFOL ;  Surgeon: Suzette Espy, MD;  Location: [facility];  Service: Endoscopy;  Laterality: N/A;  11:00am   HAND / FINGER LESION EXCISION Right    IR THORACENTESIS ASP PLEURAL SPACE W/IMG GUIDE  03/01/2019   IR THORACENTESIS ASP PLEURAL SPACE W/IMG GUIDE  03/31/2019   POLYPECTOMY  01/15/2016   Procedure: POLYPECTOMY;  Surgeon: Suzette Espy, MD;  Location: [facility];  Service: Endoscopy;;   POLYPECTOMY  03/13/2022   Procedure: POLYPECTOMY;  Surgeon: Suzette Espy, MD;  Location: [facility];  Service: Endoscopy;;   right shoulder surgery     TONSILLECTOMY  1955    Home Medications:  Allergies as of 11/25/2023       Reactions   Amlodipine Other (See Comments)   Stopped due to kidney issues   Vicodin [hydrocodone -acetaminophen ] Other (See Comments)   Patient is unsure of reaction        Medication List        Accurate as of Nov 24, 2023 12:34 PM. If you have any questions, ask your nurse or doctor.          acetaminophen  500 MG tablet Commonly known as: TYLENOL  Take 1,000 mg by mouth every 6 (six) hours as needed for headache.   albuterol  108 (90 Base) MCG/ACT inhaler Commonly known as: VENTOLIN  HFA Inhale 2 puffs into the lungs every  6 (six) hours as needed (Asthma).   levothyroxine  25 MCG tablet Commonly known as: SYNTHROID  Take 25 mcg by mouth daily before breakfast.   lithium  carbonate 150 MG capsule TAKE 3 CAPSULES BY MOUTH AT BEDTIME   metoprolol  succinate 25 MG 24 hr tablet Commonly known as: TOPROL -XL Take 1 tablet by mouth once daily   pantoprazole  40 MG tablet Commonly known as: PROTONIX  TAKE ONE TABLET BY MOUTH ONCE DAILY   pramipexole  1 MG tablet Commonly known as: MIRAPEX  Take 1 tablet (1 mg total) by mouth 2 (two) times daily.   rivaroxaban  20 MG Tabs tablet Commonly known as: XARELTO  Take 20 mg by mouth daily after supper.   rosuvastatin  20 MG tablet Commonly known as: CRESTOR  Take 1 tablet (20 mg total) by mouth daily.    SUPER B COMPLEX/C PO Take 1 tablet by mouth daily.   Vitamin D3 250 MCG (10000 UT) capsule Take 10,000 Units by mouth daily.        Allergies:  Allergies  Allergen Reactions   Amlodipine Other (See Comments)    Stopped due to kidney issues   Vicodin [Hydrocodone -Acetaminophen ] Other (See Comments)    Patient is unsure of reaction    Family History: Family History  Problem Relation Age of Onset   Diabetes Mother    Heart failure Mother    Hypertension Mother    Hypertension Father    Cancer Paternal Grandmother    Stroke Sister    Liver disease Brother    Vascular Disease Brother    Arthritis Brother    Colon cancer Neg Hx     Social History:  reports that he has never smoked. He has never used smokeless tobacco. He reports that he does not drink alcohol and does not use drugs.  ROS: All other review of systems were reviewed and are negative except what is noted above in HPI  Physical Exam: There were no vitals taken for this visit.  Constitutional:  Alert and oriented, No acute distress. HEENT: [place] AT, moist mucus membranes.  Trachea midline, no masses. Cardiovascular: No clubbing, cyanosis, or edema. Respiratory: Normal respiratory effort, no increased work of breathing. GI: There are no inguinal hernias GU: Phallus circumcised.  Moderate meatal stenosis.  Testicles normal bilaterally.  Epididymal structures normal.  Normal anal sphincter tone.  Prostate size 40 g, nonnodular, nontender. Lymph: No cervical or inguinal lymphadenopathy. Skin: No rashes, bruises or suspicious lesions. Neurologic: Grossly intact, no focal deficits, moving all 4 extremities. Psychiatric: Normal mood and affect.  Laboratory Data: Lab Results  Component Value Date   WBC 7.7 08/22/2023   HGB 13.6 08/22/2023   HCT 41.6 08/22/2023   MCV 98 (H) 08/22/2023   PLT 181 08/22/2023    Lab Results  Component Value Date   CREATININE 1.51 (H) 08/22/2023    No results found for:  "PSA"  No results found for: "TESTOSTERONE"  Lab Results  Component Value Date   HGBA1C 5.2 11/24/2022    Urinalysis    Component Value Date/Time   COLORURINE YELLOW 11/24/2022 1535   APPEARANCEUR Clear 09/18/2023 1330   LABSPEC 1.009 11/24/2022 1535   PHURINE 5.0 11/24/2022 1535   GLUCOSEU Negative 09/18/2023 1330   HGBUR NEGATIVE 11/24/2022 1535   BILIRUBINUR Negative 09/18/2023 1330   KETONESUR NEGATIVE 11/24/2022 1535   PROTEINUR Trace 09/18/2023 1330   PROTEINUR NEGATIVE 11/24/2022 1535   NITRITE Negative 09/18/2023 1330   NITRITE NEGATIVE 11/24/2022 1535   LEUKOCYTESUR 1+ (A) 09/18/2023 1330   LEUKOCYTESUR  LARGE (A) 11/24/2022 1535    Lab Results  Component Value Date   LABMICR See below: 09/18/2023   WBCUA 6-10 (A) 09/18/2023   LABEPIT 0-10 09/18/2023   MUCUS Present (A) 07/22/2023   BACTERIA Few 09/18/2023    Pertinent Imaging:    50 ppgs of old records reviewed--only PSA available is that in HPI.   Assessment: 1.  Elevated PSA with recent  value 6.5.  He does have a baseline 4 years ago of 2.1 but also has what appears to be possible UTI  2.  Microscopic hematuria/possible UTI  3.  Meatal stenosis   Plan: 1.  I did tell him that we will eventually need to recheck his PSA.  I will think he needs evaluation now as he does have polyuria/microscopic hematuria  2.  Urine is sent for culture.  No antibiotics yet  3.  If antibiotic administered, I will have him come back a few weeks after that to check urine as well as repeat PSA  4.  If culture negative, he will first need an evaluation for microscopic hematuria then evaluation of his PSA  5.  I did recommend that he self dilate every 2 to 3 weeks. There are no diagnoses linked to this encounter.  No follow-ups on file.  Roque Collar, MD  [facility] [place]  Addendum: I have received 2 more PSA levels from [facility]-- PSA on 06/28/2022 was 3.9 PSA on 04/05/2020 was  3.9  Will wait for repeat PSA

## 2023-11-25 ENCOUNTER — Ambulatory Visit: Admitting: Urology

## 2023-11-25 DIAGNOSIS — R3129 Other microscopic hematuria: Secondary | ICD-10-CM

## 2023-11-25 DIAGNOSIS — N35010 Post-traumatic urethral stricture, male, meatal: Secondary | ICD-10-CM

## 2023-11-25 DIAGNOSIS — R972 Elevated prostate specific antigen [PSA]: Secondary | ICD-10-CM

## 2023-11-25 DIAGNOSIS — N3001 Acute cystitis with hematuria: Secondary | ICD-10-CM

## 2023-12-02 ENCOUNTER — Ambulatory Visit: Admitting: Internal Medicine

## 2023-12-09 ENCOUNTER — Encounter: Payer: Self-pay | Admitting: Psychiatry

## 2023-12-09 ENCOUNTER — Ambulatory Visit (INDEPENDENT_AMBULATORY_CARE_PROVIDER_SITE_OTHER): Admitting: Psychiatry

## 2023-12-09 DIAGNOSIS — F5105 Insomnia due to other mental disorder: Secondary | ICD-10-CM

## 2023-12-09 DIAGNOSIS — F411 Generalized anxiety disorder: Secondary | ICD-10-CM | POA: Diagnosis not present

## 2023-12-09 DIAGNOSIS — G4710 Hypersomnia, unspecified: Secondary | ICD-10-CM

## 2023-12-09 DIAGNOSIS — Z79899 Other long term (current) drug therapy: Secondary | ICD-10-CM | POA: Diagnosis not present

## 2023-12-09 DIAGNOSIS — F3132 Bipolar disorder, current episode depressed, moderate: Secondary | ICD-10-CM | POA: Diagnosis not present

## 2023-12-09 DIAGNOSIS — F410 Panic disorder [episodic paroxysmal anxiety] without agoraphobia: Secondary | ICD-10-CM | POA: Diagnosis not present

## 2023-12-09 DIAGNOSIS — G4733 Obstructive sleep apnea (adult) (pediatric): Secondary | ICD-10-CM | POA: Diagnosis not present

## 2023-12-09 MED ORDER — LITHIUM CARBONATE 150 MG PO CAPS: 450.0000 mg | ORAL_CAPSULE | Freq: Every day | ORAL | 0 refills | Status: DC

## 2023-12-09 MED ORDER — PRAMIPEXOLE DIHYDROCHLORIDE 1 MG PO TABS
1.0000 mg | ORAL_TABLET | Freq: Two times a day (BID) | ORAL | 0 refills | Status: DC
Start: 2023-12-09 — End: 2024-02-10

## 2023-12-09 MED ORDER — SERTRALINE HCL 50 MG PO TABS: ORAL_TABLET | ORAL | 1 refills | Status: DC

## 2023-12-09 NOTE — Progress Notes (Signed)
 VICTORY STROLLO [id number] [birth year] 78 y.o.    Subjective:   Patient ID:  Casey [name] is a 78 y.o. (DOB [birth year]) male.  Chief Complaint:  Chief Complaint  Patient presents with   Follow-up   Depression   Anxiety    HPI  ELIGIO ANGERT presents for follow-up of bipolar disorder and anxiety and history of confusion  At visit  Dec 02, 2018.  He was having confusion and benztropine  was stopped to see if that was the cause.  Also it was having balance issues and therefore Trileptal  was switched all to nighttime 1200 mg nightly. Confusion much better per both of them with DC benztropine .  Balance better with change Trileptal .   visit was January 13, 2019 and he was doing well as noted above and he was encouraged not to make further med changes at that time.  visit April 13, 2019.  He was doing better with regard to depression than he had in quite some time on the Wellbutrin  and no meds were changed.  He was having health problems with pulmonary effusion and fluid removed twice related to fall in January 2020.  seen September 13, 2019.  For residual depression the following decisions were made: Balance problem much improved with dosing Trileptal  all in the evening. Depression under partial control & wife wants it to be better per his wife.  No mood swings.   Increase bupropion  XL to 2 of the 150 mg tablets in the morning until the current bottle is gone. Then new RX will be 1 of the 300 mg tablets each morning.  November 08, 2019 appointment the following is noted:  Seen with wife, Stana Ear. Small amount of improvement with change.  Anxiety got worse.  Notices when he goes out.  Worries about social and political things.  Wife keeps him active at home.  Allerigies bother him..  No sig caffeine except tea when goes out.  Wife sees him as a little more active and initiative.  More productive.  Started some cooking.  Getting out and up better.  A little more talkative.   "He wants to be hyper  but we know what happens". Wife Stana Ear thinks   But for the last 2 years he's done nothing and laid around all the time.  He's a lot more active now. Big improvement. Went to church first time in 2 years. Plan:  For TRD, Pramipexole  0.25 mg tablet 1/2 twice daily for 1 week then 1 twice daily.  12/20/2019 appointment with the following noted:  Seen with wife. A little better motivated to get out but not to get in crowds.  Wife agrees more motivated and went to church once alone without pressure.  Wife says he's more talkative and much  Better interest.  Still not motivated to do things like mow the grass.  Others's have seen a difference. Concentration and memory are improved but not normal. No Se pramipexole .  Getting out of bed earlier and better now.. Wife admisters meds bc earlier he messsed them up. Plan: Wife has seen a big improvement therefore Partial improvement is clear from this so increase Pramipexole  0.375 mg tablet  twice daily  Anxiety is not fully managed.  Partly related to depression. Tremor is manageable.    02/15/20 appt with the following noted:  Send with wife Stana Ear Doing good.  Increased pramipexole  as indicated.  Tolerating meds Wellbutrin  XL 300 mg AM & Trileptal  1200 mg HS with it. Much better with depression.  Stays up more and speinding less time in bed.   Wife agrees he's much better and handling things better. 2 1/2 yo 4th cousin girl drowned in pond lately.  Big family. Wife pleased he handled it better. SE constipation ? Related. History Linzess  but diarrhea. He's not highly motivated but it is better.  Depression is much improved.  Had been depressed for years. Plan no med changes  06/13/2020 appointment with the following noted: Pretty good but cardiologist noted today increased pulse needing treatment.  Uncontrolled afib even after ablation. Hard to tell about depression bc of fatigue Dt heart problems. Wife said he did well Thanksgiving and participated more  in conversation per wife. Plan: no med changes  09/06/2020 appointment with following noted: Remains on Wellbutrin  XL 300 mg every morning, ginkgo biloba 40 mg twice daily, oxcarbazepine  1200 mg nightly, pramipexole  0.75 mg twice daily. Less sig health concerns. He thinks mood has been pretty good.  Consistent with meds. Wife thinks he stays tired and sleepy and wonders if can give him a stimulant.   Sleep good.  Will nap.  Doesn't bother him.  Walks dog 20 min in AM.  Not much initiative per wife.  Not depressed but when depressed he stayed in the bed lasting a couple of years.  History of OSA but lost weight and doesn't think he has apnea anymore.  No CPAP. Chronic afib. Not much caffeine. Plan: Wife& patient has seen a big improvement with Pramipexole  0.375 mg tablet  twice daily They ask consideration be given to the following: If his energy gets better by treating his tachycardia they would like to consider reducing the pramipexole  or possibly eliminating it at follow-up.   For energy trial reduction pramipexole  to 0.25 mg BID.  If gets more depressed call. Alternative of modafinil  100  12/04/2020 appointment with the following noted: Energy is better with awakening earlier at 8 and doing more and getting out more.  Going to bed later with 8 hour  Sleep. Mood fluctuates with more reacting to wife mainly.  More outspoken.  Per W he has to vent.  She thinks he is too focused on politics and issues.   Plan: Depression under control.  No mood swings.   Continue bupropion  XL 300 mg tablets each morning. Continue Trileptal  600 mg 2 at HS. No higher DT anxiety risk and no traditional stimulants DT afib.  Wife & patient has seen a big improvement with Pramipexole  and able to reduce to  0.25 mg tablet  twice daily  02/20/2021 appointment with the following noted:  seen with wife Prednisone  made him real hyper.  Been off of it for at least a couple weeks.  Hyperverbal and hyperactive.  Hip pain  interferes with sleep.  Sees doctor tomorrow. Average 3-4 hours at night and no naps bc busy.  No excessive spending but more than usual.  Denies appetite disturbance.  Patient reports that energy and motivation have been reduced.  Patient has difficulty with concentration and memory.  Patient denies any suicidal ideation.  W had MI in June. A/P: mild mania Wean Wellbutrin  over 2 weeks. Continue Trileptal  600 mg 2 at HS. Wife & patient has seen a big improvement with Pramipexole  and able to reduce to  0.25 mg tablet  twice daily. Continue it unless mania does not resolve with less bupropion .  03/26/2021 phone call: Stana Ear stated Diamonte has slightly improved since last week.He has not been able to sleep and was up at 2 am texting  people in a disoriented state.His pcp put him on Ambien 10 mg and this has helped him sleep.However,linda stated he is still working him self too much.She said he is jittery and has to have something to do at all times.He does not want to slow down or get rest.She thinks he needs to cutback on the Mirapex  as well. MD response:  I agree with his wife about reducing Mirapex  from 1 tablet twice a day to half tablet twice a day for 1 week and if his symptoms are not resolved and stop the Mirapex .  04/18/2021 phone call with wife and MD: Patient is still manic after stopping Wellbutrin  and pramipexole .  Wife's concern is hard to get him to settle down at night.  Reviewed with wife prior psychiatric medications which are multiple.  Typically one would use an antipsychotic in this case but she does not think that they worked in the past.  She says he has been on higher doses of Trileptal  and tolerated it. Increase Trileptal  to 600 mg every morning and 1200 mg nightly for mania.  Keep scheduled appointment.  05/02/2021 wife reports patient still manic: We cannot go higher in the Trileptal  because of balance issues.  Therefore olanzapine  10 mg nightly was started and we discussed the option  of hospitalization.  We will try to get the patient in urgently to an appointment.  05/07/2021 appointment with the following noted: seen with wife, Stana Ear Not doing too well.  Trouble getting out of bed.  Wife says he's still up in middle of the night but he says he's sleeping better.  Sleep in recliner downstairs. She says he's still manic with hyperactive, hyper graphia but won't show her what he's writing. On olanzapine   No changes off Wellbutrin  and pramipexole . Apparently more balance problems with increase Trileptal  to 1800 mg daily.  Dropped back to 1200 mg HS. Admits to some confusion.  Has pulled off labels on bottle per wife. She thinks he's averaging 4-6 hours of sleep and a little better than it was. Wife said he started cussing and he has not done that in the past. Other episodes of inappropriate decision making.  He recognizes he's manic but can't control himself.  Hyperverbal, pressured.  In past had speech problems with mania also.  Neg neuro work up for stroke in the past. SE balance and tremor. Some trouble getting out of chair? Plan: For mania,Increase olanzapine  to 15 mg PM Follow-up Friday for urgent visit  05/11/2021 appointment with the following noted: Calming down a little. He thinks he's sleeping better.  She says he needs whole Ambien with olanzapine  and last night still not sleeping.  He woke her with complaints of back pain. She's not seeing SE and nor is he.  She's not seeing much change  He says back pain is complicating his sleep and his problem. He tries to stay active.   She says he talks incessantly but not as much to her. She says major life events have triggered mania in him in the past. She notices him making poor decisions. Plan: Increase olanzapine  to 20 mg PM  05/24/2021 appointment with the following noted: with D Maureen Sour A week ago went of to doctor's office alone and wife called bc he was agitated. Per wife still going wide open but is sleeping  better. Yesterday had fever and talking out of his head and dx UTI. He still feels driven to do chores but distractible and inefficient.   D does not see  improvement and nor does wife.   7 hour sleep lately. Plan: Increase olanzapine  to 30 mg in evening Reduce oxcarbazepine  to 1 and 1/2 tablets in the evening.  06/04/2021 appointment: Seen with wife Sleeping more 7-9 hours.   Per wife still some confusion and still short tempered.  Less pressured sleep. He won't go to bed when sleepy and waits too long to get in bed and is weak and cannot do it by himself. Wife agrees he's less "wide open" than before and will be able to sit and watch TV show now. B died yesterday. Plan: Continue olanzapine  to 30 mg in evening Reduce oxcarbazepine   again to 1 tablets in the evening for 1 week, then 1/2 tablet at night for a week then stop it.  07/04/21 appt noted:  seen with wife Stana Ear Hyper-ness is better.  Some trouble with concentration but able to make peanut butter fudge. Problems with afib.   Sleeping a lot and some instability.  Goes to sleep in chair.  Better with irritability.  No longer talking constantly but still some in spells.   Not depressed.   No change in balance off oxcarbazepine . Wife said last week mixed up meds and got oversedated.  She discovered the option. Plan: Reduce olanzapine  to 20 mg PM now that mania is largely resolved.  08/06/2021 appointment with the following noted: Only psych med is reduced olanzapine  20 mg HS RLS and fidgety.  All day.  Can sit for an hour.  Uncomfortable when sitting. Stopped Ginko and wife noticed RLS more.  She restarted it.  No longer rocking back and forth. Caffeine tea at lunch.  No coffee..  [place]rop some. RLS recently started. Bipolar sx great per wife.  Not wanting to go out much. CO tiredness.  Falls asleep in recliner.  Sleep all night. Eats sweets all his life. Plan: No longer manic. Reduce olanzapine  to 15 mg earlier in PM Stop  caffeine  09/06/2021 appointment with the following noted: Reduced olanzapine  15 mg and still RLS all the day. No change in that.  But can sit in church ok.  Will move legs when standing and talking to people. Sleeping more than he should from 10 until 9. And still naps and little initiative bc tired. Not depressed but not back to normal.  Not as social as usual. Loves to eat out. Plan: Reduce olanzapine  from 15 to 7.5 mg earlier in PM Stop caffeine  10/02/2021 appointment with the following noted: seen again with wife Stana Ear Only psych med is the reduced dose of olanzapine  7.5 mg for 30 days. Back pain limits activity and getting shots. Taking tramadol for pain.  1-2 prn.  One helps. 2 is sedating. RLS is better and mainly now the problem is back pain.  Has to sleep in recliner DT pain.   Stana Ear says hard to tell about effect from reduced olanzapine  bc he's having such back pain and taking tramadol.  Doesn't feel like doing anything. Plan continue olanzapine  7.5 mg every afternoon  12/04/2021 appointment with the following noted: seen with wife Still doing well with olanzapine  7.5 mg PM No SE.  RLS is OK now. Sleep good 8-10 hours. Not depressed nor manic sx.   Happy with meds.   Wife gall bladder surgery. H helping wife. Plan: olanzapine  7.5 mg earlier in PM Stopcaffeine   03/19/2022 phone call: Wife called complaining that her husband sleeping 14 to 15 hours a day on olanzapine  7.5 mg daily and wondering about trying  to reduce it. MD response: Olanzapine  is his only mood stabilizer at present.  There is a risk that he could get manic again with the dosage reduction.  But we can try reducing from 7.5 to 5 mg at night.  That may or may not help with his tendency to sleep excessively.  Some people will do that even on low doses of olanzapine .  Please send in prescription for olanzapine  5 mg tablets 1 nightly #30, 1 additional refill and make a note in the pharmacy section of the order to  cancel any other olanzapine  refills.     06/04/2022 appointment noted:  wife says a tad better with less olanzapine  but still wants to sleep and sit around a lot. Keeps saying I'm not depressed.   Reduced olanzapine  5 mg HS. No mania or depression. Hx sleep study and dx OSA but per wife got better with wt loss.  Not using CPAP. Eating less but wt loss stable. Asks about seasonal pattern bc of the history.  She can't remember the seasonal pattern.   Plan: Reduce  DT sleepiness olanzapine  2.5 mg earlier in PM Mild TD   07/29/22 TC: no changes with reduction so increased back to 5 mg olanzapine .  08/14/22 appt noted: Still sleeping too much 12 hours.   Also been irritable with flashes.   Something can set him off. Probably getting depressed some with less olanzapine .  Some anxiety.   Some reduction in interest and not going to church like before and doesn't like to be around people.  Doesn't want wife to help others either.   No restlessness.  Will nap in the afternoon. Not enjoying things and not laughing. Plan: Is getting more depressed again, resume Wellbutrin  XL up to 300 mg AM  10/14/22 appt noted:  with wife Stana Ear Psych Med: olanzapine  5 HS, Wellbutrin  300 mg AM Feels a little more like getting out but wife says not much.  She sees him being a little more agitated and irritable.  Still doesn't want to go out normally or be with other people.  Usual self is social.  Don't talk as much.  Doesn't want to be alone and doesn't want wife to be social. Sleeps a lot.  Partly DT back pain. Not nervous or anxious. Plan: Is having apathetic depressed again,  and failed Wellbutrin .   Resume pramipexole  off label which worked before,  0.25 mg BID for 4 days then 0.375 mg BID  10/23/22 TC: still dep and wanting to increase pramipexole .  MD resp: incr to 0.5 mg BID  11/24/22 and neuro FU 01/08/23 noted:  follow-up for acute right frontal juxtacortical infarct most slightly due to A-fib due to  noncompliance with Xarelto .  Patient is here with his wife today.  He feels that he has returned to his baseline.   02/27/23 appt noted:  with Stana Ear Since stroke not back to church.  No energy and not wanting to do anything.  Sleep 12 hours and watches TV and recliner.  Wife says sleep excess no change since stroke.  Still dep.   Still wanting to isolate from others. No anxiety. Plan: Stop olanzapine  Start Vraylar 1 in the AM Reduce pramipexole  to 1 twice daily for 4 days then 1/2 twice daily for 4 days then stop it. This plan will reduce meds and answer question about sleepiness from olanzapine  and reduce polypharmacy  03/18/23 TC" Wife reporting no improvement in depression and he is still sleeping all the time. She is also  reporting it is too expensive and they would not be able to afford it, but PA has not been done. Requesting to go back to previous medications.  CC resp: Me  to Matthew Songster, CMA  CC   03/19/23  6:39 PM Note It has not been long enough and I need to see him to find an alternative to Vraylar.  Stay on the Vraylar 1.5 mg daily until his appt later this month.  I don't want to resume olanzapine  which didn't work until we explore other options.     04/10/23 appt noted: seen with wife Lawton Price meds: Vraylar 1.5 mg daily.  No olanzapine , pramipexole  stopped. Sleeping about the same but easier to get up per wife on Vraylar.  No change in mood.  Still feels dep.  Not wanting to go out and be around people.  Without change.  No social life.  More HA.   Still dealing with chronic TR afib and tx ongoing. Easily out of breath.   Will walk dog 10 mins.   Tires him.  Sleeps a lot during the day. Hx OSA but since lost wt not needing CPAP Doesn't laugh much. Not going to church DT dep. Worry a lot without change.  Will resolve when not dep.   Plan: Stop Vraylar Start 3 of the 150 mg capsules of lithium  at night Get a blood level in the morning 5-7 days later Use modafinil  for  alertness if needed  05/26/23 appt noted:  seen with D Maureen Sour Meds: lithium  450 HS, no Vraylar, modafinil  prn.   Lithium  level 0.4, Cr 1.34 pretty stable. Mood kind of OK.  Sleep is still too much.   Wife's notes no big change but is awake more than he was.  Hard to get OOB before 11.   EKG per card was normal QT but history "permanent atrial fibrillation" with hx stroke DT unintentional noncompliance with anticoagulation.  Plan:  lithium  450 HS.  Lithium  0.4.   Option modafinil  if not better in a couple of weeks.  Resume pramipexole  increase, to 0.375 mg BID off label for depression.  06/24/23 appt noted:  seen with wife, Lawton Price meds: Lithium  450 nightly, pramipexole  0.25 mg tablets 1-1/2 twice daily, no modafinil  100 mg every morning About the same but a little better.  Getting out and going more . Per wife seems some better but she doesn't think he's doing much  and not as much as normal.  Not sig more social and not normal for  him. No sig SE.   Physically is moving better.   To bed 9 and will get up at 930 am instead of 11.   Pramipexole  helped a little. Mild stroke in May.  Will have Watchman device placed. Plan: For TRD increase pramipexole  to 0.5 mg BID and then to previous dose of 0.75 mg BID  07/29/23 appt noted:  with wife Meds: incr pramipexole  to 0.75 mg BID, lithium  450 mg daily,  Per wife: "Absolutely"  re: improvement per wife.  Getting out more and more wsocial and motivated.  More household chores. He noticed "I'm just myself".  Talking more.  More interested.  Dep resolved. Noticed benefit gradually over a couple of weeks. More involuntary tongue movements  09/01/23 appt noted: with wife Stana Ear Meds: as above. A little more active and getting out more and less depressed and more interested. SE tremor occ &  Tongue movements about the same and when nervous is worse. Sleep well.  Wife says dep much better.  Some laughter occ which hasn't been there.  She doesn't  feel he is all the way well.  Still likes to sit around a lot. Not as consisstent with Xarelto  as he should be.  Had 2-3 ministrokes.  Pending surgery to keep that from being necessary. Plan incr pramipexole  1 mg BID  09/25/23 appt noted: Med: incr pramipexole  1 mg BID, lithium  150 mg 3 daily, modafinil  100 AM,  A little bit better.  Health concerns with ministrokes.  Pending Watchman insert for perm a fib.  W notes he's talking more and a little more active but still not motivated to go out much and doesn't want her to go out much.  He doesn't want to go out much until his health is better with breathing.   Not really dep but kind of down with health issues dragging out and work up dragging.  Worrying over health.  12/09/23 appt noted: Med:  pramipexole  1 mg BID, lithium  150 mg 3 daily, NO modafinil  100 AM,  Started having panic.  When goes walking and feels normal.  But can be triggered by heat and humidity with px breathing.  Happened going to [employer].  Lungs checked out fine.   Was going to have Watchman put in and that's when panic started.  So didn't get it done.  But panic continues.  Wife said panic can occur going in the restaurant but does seem to have trouble with exertion.  Not daily.  Only lasts 2-3 mins. Since stroke worrying about having ministrokes.   Not dep but wife notes he's not confident being alone.  Doesn't want to socialize.  Loves to go out and eat.    Past Psychiatric Medication Trials: Seroquel  300,  olanzapine  30 resolved mania,  (Improved  with Increase olanzapine  to 30 mg PM and off the oxcarbazepine .) perphenazine, risperidone,  Latuda,   Abilify, Vraylar 1.5 mg for 1 month without benefit Lithium  did great helped but SE afib, kidneys and prostate,    alprazolam ,  Depakote, Trileptal  1800 balance problems  carbamazepine , lamotrigine  caused nausea,  citalopram, sertraline 150,    Wellbutrin  300 anxiety and irritability and failed 10/2022,   Pramipexole  0.375  BID helped intially, up to 1 BID Benztropine  confusion        1 prior psych hosp depression                                                    Review of Systems:  Review of Systems  Constitutional:  Positive for fatigue.  HENT:         Clicking teeth  Eyes:  Negative for visual disturbance.  Respiratory:  Positive for shortness of breath. Negative for cough.   Gastrointestinal:  Positive for constipation.  Musculoskeletal:  Positive for arthralgias, back pain and gait problem.  Skin:  Negative for rash.       chronic  Neurological:  Positive for tremors and weakness.       Balance px longterm no worse More tongue movements  Psychiatric/Behavioral:  Negative for agitation, behavioral problems, confusion, decreased concentration, dysphoric mood, hallucinations, self-injury, sleep disturbance and suicidal ideas. The patient is nervous/anxious. The patient is not hyperactive.     Medications: I have reviewed the patient's current medications.  Current Outpatient Medications  Medication Sig Dispense Refill  acetaminophen  (TYLENOL ) 500 MG tablet Take 1,000 mg by mouth every 6 (six) hours as needed for headache.     albuterol  (VENTOLIN  HFA) 108 (90 Base) MCG/ACT inhaler Inhale 2 puffs into the lungs every 6 (six) hours as needed (Asthma).     Cholecalciferol (VITAMIN D3) 250 MCG (10000 UT) capsule Take 10,000 Units by mouth daily.     levothyroxine  (SYNTHROID , LEVOTHROID) 25 MCG tablet Take 25 mcg by mouth daily before breakfast.     lithium  carbonate 150 MG capsule TAKE 3 CAPSULES BY MOUTH AT BEDTIME 90 capsule 0   metoprolol  succinate (TOPROL -XL) 25 MG 24 hr tablet Take 1 tablet by mouth once daily 90 tablet 3   pantoprazole  (PROTONIX ) 40 MG tablet TAKE ONE TABLET BY MOUTH ONCE DAILY (Patient taking differently: Take 40 mg by mouth daily.) 30 tablet 11   pramipexole  (MIRAPEX ) 1 MG tablet Take 1 tablet (1 mg total) by mouth 2 (two) times daily. 180 tablet 0   rivaroxaban  (XARELTO ) 20 MG  TABS tablet Take 20 mg by mouth daily after supper.      rosuvastatin  (CRESTOR ) 20 MG tablet Take 1 tablet (20 mg total) by mouth daily. 30 tablet 0   SUPER B COMPLEX/C PO Take 1 tablet by mouth daily.     No current facility-administered medications for this visit.    Medication Side Effects: resolved  Allergies:  Allergies  Allergen Reactions   Amlodipine Other (See Comments)    Stopped due to kidney issues   Vicodin [Hydrocodone -Acetaminophen ] Other (See Comments)    Patient is unsure of reaction    Past Medical History:  Diagnosis Date   Arthritis    Asthma    Atrial fibrillation (HCC) 01/12/2010   2D Echo EF=>55%   Bipolar disorder (HCC)    Chronic back pain    Depression    Dysrhythmia    AFib   GERD (gastroesophageal reflux disease)    History of colonic polyps    History of gout    Hyperlipidemia    Hypertension    Hypothyroidism    Morbid obesity (HCC)    OSA (obstructive sleep apnea)    AHI was 27.62hr RDI was 34.3 hr REM 0.00hr; had CPAP years ago but no longer uses.   Prostatitis    Tubular adenoma of colon 01/2016    Family History  Problem Relation Age of Onset   Diabetes Mother    Heart failure Mother    Hypertension Mother    Hypertension Father    Cancer Paternal Grandmother    Stroke Sister    Liver disease Brother    Vascular Disease Brother    Arthritis Brother    Colon cancer Neg Hx     Social History   Socioeconomic History   Marital status: Married    Spouse name: Not on file   Number of children: Not on file   Years of education: Not on file   Highest education level: Not on file  Occupational History   Not on file  Tobacco Use   Smoking status: Never   Smokeless tobacco: Never  Vaping Use   Vaping status: Never Used  Substance and Sexual Activity   Alcohol use: No    Alcohol/week: 0.0 standard drinks of alcohol   Drug use: No   Sexual activity: Yes    Birth control/protection: None  Other Topics Concern   Not on  file  Social History Narrative   Not on file   Social Drivers of Health  Financial Resource Strain: Not on file  Food Insecurity: No Food Insecurity (11/24/2022)   Hunger Vital Sign    Worried About Running Out of Food in the Last Year: Never true    Ran Out of Food in the Last Year: Never true  Transportation Needs: No Transportation Needs (11/24/2022)   PRAPARE - Administrator, Civil Service (Medical): No    Lack of Transportation (Non-Medical): No  Physical Activity: Not on file  Stress: Not on file  Social Connections: Not on file  Intimate Partner Violence: Patient Declined (11/24/2022)   Humiliation, Afraid, Rape, and Kick questionnaire    Fear of Current or Ex-Partner: Patient declined    Emotionally Abused: Patient declined    Physically Abused: Patient declined    Sexually Abused: Patient declined    Past Medical History, Surgical history, Social history, and Family history were reviewed and updated as appropriate.   Please see review of systems for further details on the patient's review from today.   Objective:   Physical Exam:  There were no vitals taken for this visit.  Physical Exam Constitutional:      General: He is not in acute distress. Musculoskeletal:        General: No deformity.  Neurological:     Mental Status: He is alert and oriented to person, place, and time.     Coordination: Coordination abnormal.     Gait: Gait normal.     Comments: Mild rocking motions legs resolved Mild + lip licking and buccal movements ongoing Uses cane  Psychiatric:        Attention and Perception: He is attentive. He does not perceive auditory hallucinations.        Mood and Affect: Mood is anxious. Mood is not depressed. Affect is not labile, blunt, angry or inappropriate.        Speech: Speech is not rapid and pressured or tangential.        Behavior: Behavior normal. Behavior is not agitated.        Thought Content: Thought content normal. Thought  content is not delusional. Thought content does not include homicidal or suicidal ideation. Thought content does not include suicidal plan.        Cognition and Memory: Cognition normal.        Judgment: Judgment normal.     Comments: Insight fair.   No auditory or visual hallucinations. No delusions.  More anxious Gait is normal again. Lip smacking worse with pramipexole  More talkative , engaged, interested, not as blunted but still socially avoidant     Lab Review:     Component Value Date/Time   NA 144 08/22/2023 1229   K 4.3 08/22/2023 1229   CL 106 08/22/2023 1229   CO2 24 08/22/2023 1229   GLUCOSE 90 08/22/2023 1229   GLUCOSE 104 (H) 12/29/2022 0443   BUN 16 08/22/2023 1229   CREATININE 1.51 (H) 08/22/2023 1229   CALCIUM  9.5 08/22/2023 1229   PROT 7.0 11/24/2022 1252   PROT 6.6 12/28/2018 0941   ALBUMIN 3.9 11/24/2022 1252   ALBUMIN 4.5 12/28/2018 0941   AST 18 11/24/2022 1252   ALT 13 11/24/2022 1252   ALKPHOS 70 11/24/2022 1252   BILITOT 0.8 11/24/2022 1252   BILITOT 0.7 12/28/2018 0941   GFRNONAA >60 12/29/2022 0443   GFRAA 63 12/28/2018 0941       Component Value Date/Time   WBC 7.7 08/22/2023 1229   WBC 5.7 12/29/2022 0443   RBC 4.23 08/22/2023  1229   RBC 4.23 12/29/2022 0443   HGB 13.6 08/22/2023 1229   HCT 41.6 08/22/2023 1229   PLT 181 08/22/2023 1229   MCV 98 (H) 08/22/2023 1229   MCH 32.2 08/22/2023 1229   MCH 31.9 12/29/2022 0443   MCHC 32.7 08/22/2023 1229   MCHC 33.7 12/29/2022 0443   RDW 12.4 08/22/2023 1229   LYMPHSABS 0.7 12/29/2022 0443   LYMPHSABS 1.1 12/28/2018 0941   MONOABS 0.5 12/29/2022 0443   EOSABS 0.2 12/29/2022 0443   EOSABS 0.1 12/28/2018 0941   BASOSABS 0.0 12/29/2022 0443   BASOSABS 0.0 12/28/2018 0941    Lithium  Lvl  Date Value Ref Range Status  04/22/2023 0.4 (L) 0.5 - 1.2 mmol/L Final    Comment:    A concentration of 0.5-0.8 mmol/L is advised for long-term use; concentrations of up to 1.2 mmol/L may be necessary  during acute treatment.                                  Detection Limit = 0.1                           <0.1 indicates None Detected      No results found for: "PHENYTOIN", "PHENOBARB", "VALPROATE", "CBMZ"   .res Assessment: Plan:    Aleksandar was seen today for follow-up, depression and anxiety.  Diagnoses and all orders for this visit:  Bipolar affective disorder, currently depressed, moderate (HCC)  Panic disorder without agoraphobia  Generalized anxiety disorder  Hypersomnolence disorder, acute, moderate  Obstructive sleep apnea syndrome  Lithium  use  Insomnia due to mental condition   Likely TD   30 min face to face time with patient and wife. We discussed Dx bipolar at 42 yo.  Bipolar depression has recurred again like last year but so far is not as severe.  Patient is very complicated because of weakness and medical problems.   History pseudodementia with depression. Prednisone  triggered some mania in 2023 and persisted off prednisone .    Dep better again with pramipexole  1 mg BID.  Unclear reason for new onset panic.  Worrying over W.  Some health fears.  ? Re: pramipexole .    Mild lip smacking consistent with TD and appears worse with addtion of prampexole..   But is not disturbing to pt nor his wife.  If still a problem with sleepiness DT multiple med failures consider modafinil  to manage.  Cr is abnormal.  Failed multiple alternatives.  Disc risks lithium  but it is what worked best.  Will try LED DT medical problems including chronic afib. Counseled patient regarding potential benefits, risks, and side effects of lithium  to include potential risk of lithium  affecting thyroid  and renal function.  Discussed need for periodic lab monitoring to determine drug level and to assess for potential adverse effects.  Counseled patient regarding signs and symptoms of lithium  toxicity and advised that they notify office immediately or seek urgent medical attention if  experiencing these signs and symptoms.  Patient advised to contact office with any questions or concerns.  h lithium  450 HS.   Lithium  0.4.  04/22/23. CR 1.45 on 05/26/23.  1.51 on 08/22/23 Cr 1.20 Jul 2023.  Disc this concern at length.  Might have to change again if progresses further. 07/01/23 Lithium  0.5.  no dosage change.  Had tremor at higher doses  Option modafinil  if not better .  defer Normal Qtc on EKG since here but afib.  Marked improvement dep For TRD after increase pramipexole  to 1mg  BID but not  back to normal. Defer further changes until immed med concerns settled. Disc SE including manic risk, but dep interfering with tx  Re: panic hesitant to add more meds but may be necessary.   Dont want to use BZ unless essential. Sertraline best antipanic opiton available low dose 25-50 mg Disc SE incl mania risk  Follow-up 2 mos   Nori Beat, MD, DFAPA   Please see After Visit Summary for patient specific instructions.   Future Appointments  Date Time Provider Department Center  01/13/2024  3:15 PM Trent Frizzle, MD [facility] None      No orders of the defined types were placed in this encounter.      -------------------------------

## 2024-01-09 NOTE — Progress Notes (Signed)
 01/13/2024 4:10 PM   Casey [name] 10-11-1945 [id number]  Referring provider: [name], MD [address] [place],  [place] [address]  No chief complaint on file.   HPI: 78 yo male sent by Dr Marvine for elevated PSA, recently 6.5 on 12.18.2024. As of yet no other old data available.  He has been seen previously by Dr. [name], the last in 2020.  He has a history of recurrent urinary tract infections as well as meatal stenosis.  The patient apparently self dilates once in a while.  He currently denies significant lower urinary tract symptoms.  Apparently, he was treated for what sounds like epididymitis 2 to 3 months ago at an urgent care center here in [place].  Currently denies gross hematuria or dysuria.  He denies history of elevated PSA.  Last PSA data I have from him when he saw Dr. Seltzer was 2.1 in 2020.  His brother does have a history of prostate cancer.    PMH: Past Medical History:  Diagnosis Date   Arthritis    Asthma    Atrial fibrillation (HCC) 01/12/2010   2D Echo EF=>55%   Bipolar disorder (HCC)    Chronic back pain    Depression    Dysrhythmia    AFib   GERD (gastroesophageal reflux disease)    History of colonic polyps    History of gout    Hyperlipidemia    Hypertension    Hypothyroidism    Morbid obesity (HCC)    OSA (obstructive sleep apnea)    AHI was 27.62hr RDI was 34.3 hr REM 0.00hr; had CPAP years ago but no longer uses.   Prostatitis    Tubular adenoma of colon 01/2016    Surgical History: Past Surgical History:  Procedure Laterality Date   APPENDECTOMY  1959   asthma     CATARACT EXTRACTION Bilateral 1995   COLONOSCOPY  2011   RMR: left-sided diverticula, minimal rectal friability. No polyps. Repeat 5 years.   COLONOSCOPY WITH PROPOFOL  N/A 01/15/2016   Dr.Rourk- diverticulosis in the entire examined colon, multiple rectal and colonic polyps, internal hemorrhoids. bx= tubular adenomas and hyperplastic polyps.     COLONOSCOPY WITH PROPOFOL  N/A 03/13/2022   Procedure: COLONOSCOPY WITH PROPOFOL ;  Surgeon: [name], MD;  [facility];  Service: Endoscopy;  Laterality: N/A;  11:00am   HAND / FINGER LESION EXCISION Right    IR THORACENTESIS ASP PLEURAL SPACE W/IMG GUIDE  03/01/2019   IR THORACENTESIS ASP PLEURAL SPACE W/IMG GUIDE  03/31/2019   POLYPECTOMY  01/15/2016   Procedure: POLYPECTOMY;  Surgeon: Lamar HERO Shaaron, MD;  [facility];  Service: Endoscopy;;   POLYPECTOMY  03/13/2022   Procedure: POLYPECTOMY;  Surgeon: [name], MD;  [facility];  Service: Endoscopy;;   right shoulder surgery     TONSILLECTOMY  1955    Home Medications:  Allergies as of 01/13/2024       Reactions   Amlodipine Other (See Comments)   Stopped due to kidney issues   Vicodin [hydrocodone -acetaminophen ] Other (See Comments)   Patient is unsure of reaction        Medication List        Accurate as of January 09, 2024  4:10 PM. If you have any questions, ask your nurse or doctor.          acetaminophen  500 MG tablet Commonly known as: TYLENOL  Take 1,000 mg by mouth every 6 (six) hours as needed for  headache.   albuterol  108 (90 Base) MCG/ACT inhaler Commonly known as: VENTOLIN  HFA Inhale 2 puffs into the lungs every 6 (six) hours as needed (Asthma).   levothyroxine  25 MCG tablet Commonly known as: SYNTHROID  Take 25 mcg by mouth daily before breakfast.   lithium  carbonate 150 MG capsule Take 3 capsules (450 mg total) by mouth at bedtime.   metoprolol  succinate 25 MG 24 hr tablet Commonly known as: TOPROL -XL Take 1 tablet by mouth once daily   pantoprazole  40 MG tablet Commonly known as: PROTONIX  TAKE ONE TABLET BY MOUTH ONCE DAILY   pramipexole  1 MG tablet Commonly known as: MIRAPEX  Take 1 tablet (1 mg total) by mouth 2 (two) times daily.   rivaroxaban  20 MG Tabs tablet Commonly known as: XARELTO  Take 20 mg by mouth daily after supper.   rosuvastatin  20  MG tablet Commonly known as: CRESTOR  Take 1 tablet (20 mg total) by mouth daily.   sertraline  50 MG tablet Commonly known as: ZOLOFT  Take 1/2 tab po qd x 7 days, then 1 tab po qd   SUPER B COMPLEX/C PO Take 1 tablet by mouth daily.   Vitamin D3 250 MCG (10000 UT) capsule Take 10,000 Units by mouth daily.        Allergies:  Allergies  Allergen Reactions   Amlodipine Other (See Comments)    Stopped due to kidney issues   Vicodin [Hydrocodone -Acetaminophen ] Other (See Comments)    Patient is unsure of reaction    Family History: Family History  Problem Relation Age of Onset   Diabetes Mother    Heart failure Mother    Hypertension Mother    Hypertension Father    Cancer Paternal Grandmother    Stroke Sister    Liver disease Brother    Vascular Disease Brother    Arthritis Brother    Colon cancer Neg Hx     Social History:  reports that he has never smoked. He has never used smokeless tobacco. He reports that he does not drink alcohol and does not use drugs.  ROS: All other review of systems were reviewed and are negative except what is noted above in HPI  Physical Exam: There were no vitals taken for this visit.  Constitutional:  Alert and oriented, No acute distress. HEENT: [place] AT, moist mucus membranes.  Trachea midline, no masses. Cardiovascular: No clubbing, cyanosis, or edema. Respiratory: Normal respiratory effort, no increased work of breathing. GI: There are no inguinal hernias GU: Phallus circumcised.  Moderate meatal stenosis.  Testicles normal bilaterally.  Epididymal structures normal.  Normal anal sphincter tone.  Prostate size 40 g, nonnodular, nontender. Lymph: No cervical or inguinal lymphadenopathy. Skin: No rashes, bruises or suspicious lesions. Neurologic: Grossly intact, no focal deficits, moving all 4 extremities. Psychiatric: Normal mood and affect.  Laboratory Data: Lab Results  Component Value Date   WBC 7.7 08/22/2023   HGB 13.6  08/22/2023   HCT 41.6 08/22/2023   MCV 98 (H) 08/22/2023   PLT 181 08/22/2023    Lab Results  Component Value Date   CREATININE 1.51 (H) 08/22/2023    No results found for: PSA  No results found for: TESTOSTERONE  Lab Results  Component Value Date   HGBA1C 5.2 11/24/2022    Urinalysis    Component Value Date/Time   COLORURINE YELLOW 11/24/2022 1535   APPEARANCEUR Clear 09/18/2023 1330   LABSPEC 1.009 11/24/2022 1535   PHURINE 5.0 11/24/2022 1535   GLUCOSEU Negative 09/18/2023 1330   HGBUR NEGATIVE 11/24/2022  1535   BILIRUBINUR Negative 09/18/2023 1330   KETONESUR NEGATIVE 11/24/2022 1535   PROTEINUR Trace 09/18/2023 1330   PROTEINUR NEGATIVE 11/24/2022 1535   NITRITE Negative 09/18/2023 1330   NITRITE NEGATIVE 11/24/2022 1535   LEUKOCYTESUR 1+ (A) 09/18/2023 1330   LEUKOCYTESUR LARGE (A) 11/24/2022 1535    Lab Results  Component Value Date   LABMICR See below: 09/18/2023   WBCUA 6-10 (A) 09/18/2023   LABEPIT 0-10 09/18/2023   MUCUS Present (A) 07/22/2023   BACTERIA Few 09/18/2023    Pertinent Imaging:    50 ppgs of old records reviewed--only PSA available is that in HPI.   Assessment: 1.  Elevated PSA with recent  value 6.5.  He does have a baseline 4 years ago of 2.1 but also has what appears to be possible UTI  2.  Microscopic hematuria/possible UTI  3.  Meatal stenosis   Plan: 1.  I did tell him that we will eventually need to recheck his PSA.  I will think he needs evaluation now as he does have polyuria/microscopic hematuria  2.  Urine is sent for culture.  No antibiotics yet  3.  If antibiotic administered, I will have him come back a few weeks after that to check urine as well as repeat PSA  4.  If culture negative, he will first need an evaluation for microscopic hematuria then evaluation of his PSA  5.  I did recommend that he self dilate every 2 to 3 weeks. There are no diagnoses linked to this encounter.  No follow-ups on  file.  Garnette CHRISTELLA Shack, MD  [facility] [place]  Addendum: I have received 2 more PSA levels from [facility] medical-- PSA on 06/28/2022 was 3.9 PSA on 04/05/2020 was 3.9  Will wait for repeat PSA

## 2024-01-13 ENCOUNTER — Ambulatory Visit: Admitting: Urology

## 2024-01-13 VITALS — BP 88/43 | HR 89

## 2024-01-13 DIAGNOSIS — N35010 Post-traumatic urethral stricture, male, meatal: Secondary | ICD-10-CM | POA: Diagnosis not present

## 2024-01-13 DIAGNOSIS — R972 Elevated prostate specific antigen [PSA]: Secondary | ICD-10-CM

## 2024-01-13 DIAGNOSIS — R3129 Other microscopic hematuria: Secondary | ICD-10-CM

## 2024-01-13 DIAGNOSIS — N3001 Acute cystitis with hematuria: Secondary | ICD-10-CM | POA: Diagnosis not present

## 2024-01-13 NOTE — Progress Notes (Signed)
 01/13/2024 8:19 AM   Casey [name] August 18, 1945 [id number]  Referring provider: [name], MD [address] [place],  [place] [address]  No chief complaint on file.   HPI:   1.7.2025: Initial visit for elevated PSA, recently 6.5 on 12.18.2024. Prior PSA data:  PSA on 06/28/2022 was 3.9 PSA on 04/05/2020 was 3.9  He has been seen previously by Dr. [name], the last in 2020.  He has a history of recurrent urinary tract infections as well as meatal stenosis.  The patient apparently self dilates once in a while.  He currently denies significant lower urinary tract symptoms.  Apparently, he was treated for what sounds like epididymitis 2 to 3 months ago at an urgent care center here in [place].  Currently denies gross hematuria or dysuria.  He denies history of elevated PSA.  Last PSA data I have from him when he saw Dr. Seltzer was 2.1 in 2020.  His brother does have a history of prostate cancer.  Repeat PSA on 3.6.2025: 8.8.  At that visit he was also noted to have greater than 30 red cells per microscopic high-power field on urinalysis as well as abnormal number of white cells.  Urine culture grew Enterococcus.  He was treated with amoxicillin .  7.1.2025: Here today for recheck.  He is doing quite well.  No real concerns about urination.  No dysuria, no gross hematuria.  He is not on any medical therapy for BPH.  IPSS 0  PMH: Past Medical History:  Diagnosis Date   Arthritis    Asthma    Atrial fibrillation (HCC) 01/12/2010   2D Echo EF=>55%   Bipolar disorder (HCC)    Chronic back pain    Depression    Dysrhythmia    AFib   GERD (gastroesophageal reflux disease)    History of colonic polyps    History of gout    Hyperlipidemia    Hypertension    Hypothyroidism    Morbid obesity (HCC)    OSA (obstructive sleep apnea)    AHI was 27.62hr RDI was 34.3 hr REM 0.00hr; had CPAP years ago but no longer uses.   Prostatitis    Tubular adenoma of colon 01/2016     Surgical History: Past Surgical History:  Procedure Laterality Date   APPENDECTOMY  1959   asthma     CATARACT EXTRACTION Bilateral 1995   COLONOSCOPY  2011   RMR: left-sided diverticula, minimal rectal friability. No polyps. Repeat 5 years.   COLONOSCOPY WITH PROPOFOL  N/A 01/15/2016   Dr.Rourk- diverticulosis in the entire examined colon, multiple rectal and colonic polyps, internal hemorrhoids. bx= tubular adenomas and hyperplastic polyps.    COLONOSCOPY WITH PROPOFOL  N/A 03/13/2022   Procedure: COLONOSCOPY WITH PROPOFOL ;  Surgeon: [name], MD;  [facility];  Service: Endoscopy;  Laterality: N/A;  11:00am   HAND / FINGER LESION EXCISION Right    IR THORACENTESIS ASP PLEURAL SPACE W/IMG GUIDE  03/01/2019   IR THORACENTESIS ASP PLEURAL SPACE W/IMG GUIDE  03/31/2019   POLYPECTOMY  01/15/2016   Procedure: POLYPECTOMY;  Surgeon: Lamar HERO Shaaron, MD;  [facility];  Service: Endoscopy;;   POLYPECTOMY  03/13/2022   Procedure: POLYPECTOMY;  Surgeon: [name], MD;  [facility];  Service: Endoscopy;;   right shoulder surgery     TONSILLECTOMY  1955    Home Medications:  Allergies as of 01/13/2024       Reactions   Amlodipine Other (See Comments)  Stopped due to kidney issues   Vicodin [hydrocodone -acetaminophen ] Other (See Comments)   Patient is unsure of reaction        Medication List        Accurate as of January 13, 2024  8:19 AM. If you have any questions, ask your nurse or doctor.          acetaminophen  500 MG tablet Commonly known as: TYLENOL  Take 1,000 mg by mouth every 6 (six) hours as needed for headache.   albuterol  108 (90 Base) MCG/ACT inhaler Commonly known as: VENTOLIN  HFA Inhale 2 puffs into the lungs every 6 (six) hours as needed (Asthma).   levothyroxine  25 MCG tablet Commonly known as: SYNTHROID  Take 25 mcg by mouth daily before breakfast.   lithium  carbonate 150 MG capsule Take 3 capsules (450 mg total) by  mouth at bedtime.   metoprolol  succinate 25 MG 24 hr tablet Commonly known as: TOPROL -XL Take 1 tablet by mouth once daily   pantoprazole  40 MG tablet Commonly known as: PROTONIX  TAKE ONE TABLET BY MOUTH ONCE DAILY   pramipexole  1 MG tablet Commonly known as: MIRAPEX  Take 1 tablet (1 mg total) by mouth 2 (two) times daily.   rivaroxaban  20 MG Tabs tablet Commonly known as: XARELTO  Take 20 mg by mouth daily after supper.   rosuvastatin  20 MG tablet Commonly known as: CRESTOR  Take 1 tablet (20 mg total) by mouth daily.   sertraline  50 MG tablet Commonly known as: ZOLOFT  Take 1/2 tab po qd x 7 days, then 1 tab po qd   SUPER B COMPLEX/C PO Take 1 tablet by mouth daily.   Vitamin D3 250 MCG (10000 UT) capsule Take 10,000 Units by mouth daily.        Allergies:  Allergies  Allergen Reactions   Amlodipine Other (See Comments)    Stopped due to kidney issues   Vicodin [Hydrocodone -Acetaminophen ] Other (See Comments)    Patient is unsure of reaction    Family History: Family History  Problem Relation Age of Onset   Diabetes Mother    Heart failure Mother    Hypertension Mother    Hypertension Father    Cancer Paternal Grandmother    Stroke Sister    Liver disease Brother    Vascular Disease Brother    Arthritis Brother    Colon cancer Neg Hx     Social History:  reports that he has never smoked. He has never used smokeless tobacco. He reports that he does not drink alcohol and does not use drugs.  ROS: All other review of systems were reviewed and are negative except what is noted above in HPI  Physical Exam: There were no vitals taken for this visit.  Constitutional:  Alert and oriented, No acute distress. HEENT: [place] AT, moist mucus membranes.  Trachea midline, no masses. Cardiovascular: No clubbing, cyanosis, or edema. Respiratory: Normal respiratory effort, no increased work of breathing. Skin: No rashes, bruises or suspicious lesions. Neurologic:  Grossly intact, no focal deficits, moving all 4 extremities. Psychiatric: Normal mood and affect.  Laboratory Data: Lab Results  Component Value Date   WBC 7.7 08/22/2023   HGB 13.6 08/22/2023   HCT 41.6 08/22/2023   MCV 98 (H) 08/22/2023   PLT 181 08/22/2023    Lab Results  Component Value Date   CREATININE 1.51 (H) 08/22/2023    No results found for: PSA  No results found for: TESTOSTERONE  Lab Results  Component Value Date   HGBA1C 5.2 11/24/2022  Urinalysis    Component Value Date/Time   COLORURINE YELLOW 11/24/2022 1535   APPEARANCEUR Clear 09/18/2023 1330   LABSPEC 1.009 11/24/2022 1535   PHURINE 5.0 11/24/2022 1535   GLUCOSEU Negative 09/18/2023 1330   HGBUR NEGATIVE 11/24/2022 1535   BILIRUBINUR Negative 09/18/2023 1330   KETONESUR NEGATIVE 11/24/2022 1535   PROTEINUR Trace 09/18/2023 1330   PROTEINUR NEGATIVE 11/24/2022 1535   NITRITE Negative 09/18/2023 1330   NITRITE NEGATIVE 11/24/2022 1535   LEUKOCYTESUR 1+ (A) 09/18/2023 1330   LEUKOCYTESUR LARGE (A) 11/24/2022 1535    Lab Results  Component Value Date   LABMICR See below: 09/18/2023   WBCUA 6-10 (A) 09/18/2023   LABEPIT 0-10 09/18/2023   MUCUS Present (A) 07/22/2023   BACTERIA Few 09/18/2023    Pertinent Imaging:    50 ppgs of old records reviewed--only PSA available is that in HPI.   Assessment: 1.  Elevated PSA with initial value 6.5 earlier this year.  Repeat 2 months later was 8.8 but the patient did have a documented UTI then.  He has not had repeat PSA since then  2.  Microscopic hematuria/possible UTI--urine is clear today following treatment of enterococcal UTI  3.  Meatal stenosis   Plan: 1.  We will recheck his PSA today  2.  I did send urine culture as well  3.  Unless PSA continues to rise, I will see him back in 6 months to recheck  No follow-ups on file.  Garnette CHRISTELLA Shack, MD  [facility] [place]  =

## 2024-01-14 ENCOUNTER — Ambulatory Visit: Payer: Self-pay

## 2024-01-14 ENCOUNTER — Other Ambulatory Visit: Payer: Self-pay | Admitting: Urology

## 2024-01-14 DIAGNOSIS — R972 Elevated prostate specific antigen [PSA]: Secondary | ICD-10-CM

## 2024-01-14 LAB — URINALYSIS, ROUTINE W REFLEX MICROSCOPIC
Bilirubin, UA: NEGATIVE
Glucose, UA: NEGATIVE
Ketones, UA: NEGATIVE
Nitrite, UA: NEGATIVE
RBC, UA: NEGATIVE
Specific Gravity, UA: 1.025 (ref 1.005–1.030)
Urobilinogen, Ur: 2 mg/dL — ABNORMAL HIGH (ref 0.2–1.0)
pH, UA: 6 (ref 5.0–7.5)

## 2024-01-14 LAB — MICROSCOPIC EXAMINATION

## 2024-01-14 LAB — PSA: Prostate Specific Ag, Serum: 11.4 ng/mL — ABNORMAL HIGH (ref 0.0–4.0)

## 2024-01-14 MED ORDER — LEVOFLOXACIN 750 MG PO TABS: 750.0000 mg | ORAL_TABLET | Freq: Every day | ORAL | 0 refills | Status: AC

## 2024-01-14 NOTE — Telephone Encounter (Signed)
-----   Message from Garnette HERO Dahlstedt sent at 01/14/2024  3:07 PM EDT ----- Please call patient-his PSA is up now to the 11 range.  At this point, I would recommend proceeding with an ultrasound and biopsy of the prostate.  I have put all orders for that again.  Please  schedule. ----- Message ----- From: Rebecka Memos Lab Results In Sent: 01/14/2024   5:38 AM EDT To: Garnette Shack, MD

## 2024-01-14 NOTE — Telephone Encounter (Signed)
 Called pt to give PSA results per MD Dahlstedt pt voiced his understanding and confirmed scheduled bx for 02/03/2024 pt advised detailed instructions will be mailed to confirmed address and a procedure clearance will be sent to PCP for Xarelto  pt voiced his understanding

## 2024-01-15 LAB — URINE CULTURE: Organism ID, Bacteria: NO GROWTH

## 2024-01-19 ENCOUNTER — Encounter: Payer: Self-pay | Admitting: Cardiovascular Disease

## 2024-01-21 NOTE — Telephone Encounter (Signed)
 Eliquis  generally better in patients prone to GI bleeding, but both medications will need to be held before the prostate biopsy. Recommend holding Xarelto  for 3 days before the biopsy, restart it once the Urologist says it is safe (usually a couple of days).SABRA

## 2024-02-02 DIAGNOSIS — I639 Cerebral infarction, unspecified: Secondary | ICD-10-CM | POA: Diagnosis not present

## 2024-02-02 DIAGNOSIS — E663 Overweight: Secondary | ICD-10-CM | POA: Diagnosis not present

## 2024-02-02 DIAGNOSIS — F319 Bipolar disorder, unspecified: Secondary | ICD-10-CM | POA: Diagnosis not present

## 2024-02-02 DIAGNOSIS — E119 Type 2 diabetes mellitus without complications: Secondary | ICD-10-CM | POA: Diagnosis not present

## 2024-02-02 DIAGNOSIS — E039 Hypothyroidism, unspecified: Secondary | ICD-10-CM | POA: Diagnosis not present

## 2024-02-02 DIAGNOSIS — Z6828 Body mass index (BMI) 28.0-28.9, adult: Secondary | ICD-10-CM | POA: Diagnosis not present

## 2024-02-02 DIAGNOSIS — E538 Deficiency of other specified B group vitamins: Secondary | ICD-10-CM | POA: Diagnosis not present

## 2024-02-02 NOTE — Procedures (Signed)
 TRANSRECTAL ULTRASOUND AND PROSTATE BIOPSY  Indication:  Elevated PSA  Prophylactic antibiotic administration: Gentamicin , levofloxacin   All medications that could result in increased bleeding were discontinued within an appropriate period of the time of biopsy.  Risk including bleeding and infection were discussed.  Informed consent was obtained.  The patient was placed in the left lateral decubitus position.  PROCEDURE 1.  TRANSRECTAL ULTRASOUND OF THE PROSTATE  The 7 MHz transrectal probe was used to image the prostate.  Anal stenosis was not noted.  TRUS volume: 29 ml  Hypoechoic areas: None  Hyperechoic areas: None  Central calcifications: present  Margins:  normal  Seminal Vesicles: normal   PROCEDURE 2:  PROSTATE BIOPSY  A periprostatic block was performed using 1% lidocaine  and transrectal ultrasound guidance. Under transrectal ultrasound guidance, and using the Biopty gun, prostate biopsies were obtained systematically from the apex, mid gland, and base bilaterally.  A total of 12 cores were obtained.  Hemostasis was obtained with gentle pressure on the prostate.  The procedures were well-tolerated.  No significant bleeding was noted at the end of the procedure.  The patient was stable for discharge from the office.

## 2024-02-03 ENCOUNTER — Other Ambulatory Visit: Payer: Self-pay | Admitting: Urology

## 2024-02-03 ENCOUNTER — Ambulatory Visit (HOSPITAL_COMMUNITY)
Admission: RE | Admit: 2024-02-03 | Discharge: 2024-02-03 | Disposition: A | Discharge status: Home / Self Care | Source: Ambulatory Visit | Attending: Urology | Admitting: Urology

## 2024-02-03 ENCOUNTER — Ambulatory Visit (HOSPITAL_BASED_OUTPATIENT_CLINIC_OR_DEPARTMENT_OTHER): Admitting: Urology

## 2024-02-03 ENCOUNTER — Encounter (HOSPITAL_COMMUNITY): Payer: Self-pay

## 2024-02-03 DIAGNOSIS — R972 Elevated prostate specific antigen [PSA]: Secondary | ICD-10-CM

## 2024-02-03 DIAGNOSIS — C61 Malignant neoplasm of prostate: Secondary | ICD-10-CM | POA: Diagnosis not present

## 2024-02-03 DIAGNOSIS — N4289 Other specified disorders of prostate: Secondary | ICD-10-CM | POA: Diagnosis not present

## 2024-02-03 MED ORDER — GENTAMICIN SULFATE 40 MG/ML IJ SOLN
INTRAMUSCULAR | Status: AC
  Filled 2024-02-03: qty 4

## 2024-02-03 MED ORDER — GENTAMICIN SULFATE 40 MG/ML IJ SOLN
160.0000 mg | Freq: Once | INTRAMUSCULAR | Status: AC
  Administered 2024-02-03: 160 mg via INTRAMUSCULAR

## 2024-02-03 MED ORDER — LIDOCAINE HCL (PF) 2 % IJ SOLN
10.0000 mL | Freq: Once | INTRAMUSCULAR | Status: AC
  Administered 2024-02-03: 10 mL

## 2024-02-03 MED ORDER — LIDOCAINE HCL (PF) 2 % IJ SOLN
INTRAMUSCULAR | Status: AC
  Filled 2024-02-03: qty 10

## 2024-02-03 NOTE — Progress Notes (Signed)
 PT tolerated prostate biopsy procedure and antibiotic injection well today. Labs obtained and sent for pathology by Lexi from ultrasound. PT transported via wheelchair at discharge with no acute distress noted and verbalized understanding of discharge instructions.

## 2024-02-04 ENCOUNTER — Other Ambulatory Visit: Payer: Self-pay | Admitting: Psychiatry

## 2024-02-04 DIAGNOSIS — F411 Generalized anxiety disorder: Secondary | ICD-10-CM

## 2024-02-04 DIAGNOSIS — F410 Panic disorder [episodic paroxysmal anxiety] without agoraphobia: Secondary | ICD-10-CM

## 2024-02-04 LAB — SURGICAL PATHOLOGY

## 2024-02-10 ENCOUNTER — Encounter: Payer: Self-pay | Admitting: Psychiatry

## 2024-02-10 ENCOUNTER — Other Ambulatory Visit: Payer: Self-pay | Admitting: Urology

## 2024-02-10 ENCOUNTER — Ambulatory Visit (INDEPENDENT_AMBULATORY_CARE_PROVIDER_SITE_OTHER): Admitting: Psychiatry

## 2024-02-10 DIAGNOSIS — F410 Panic disorder [episodic paroxysmal anxiety] without agoraphobia: Secondary | ICD-10-CM

## 2024-02-10 DIAGNOSIS — G4710 Hypersomnia, unspecified: Secondary | ICD-10-CM | POA: Diagnosis not present

## 2024-02-10 DIAGNOSIS — F3132 Bipolar disorder, current episode depressed, moderate: Secondary | ICD-10-CM

## 2024-02-10 DIAGNOSIS — C61 Malignant neoplasm of prostate: Secondary | ICD-10-CM

## 2024-02-10 DIAGNOSIS — F411 Generalized anxiety disorder: Secondary | ICD-10-CM | POA: Diagnosis not present

## 2024-02-10 DIAGNOSIS — I6930 Unspecified sequelae of cerebral infarction: Secondary | ICD-10-CM | POA: Diagnosis not present

## 2024-02-10 DIAGNOSIS — G4733 Obstructive sleep apnea (adult) (pediatric): Secondary | ICD-10-CM | POA: Diagnosis not present

## 2024-02-10 DIAGNOSIS — Z79899 Other long term (current) drug therapy: Secondary | ICD-10-CM

## 2024-02-10 MED ORDER — SERTRALINE HCL 50 MG PO TABS: 50.0000 mg | ORAL_TABLET | Freq: Every day | ORAL | 0 refills | Status: DC

## 2024-02-10 MED ORDER — LITHIUM CARBONATE 150 MG PO CAPS: 450.0000 mg | ORAL_CAPSULE | Freq: Every day | ORAL | 0 refills | Status: DC

## 2024-02-10 MED ORDER — PRAMIPEXOLE DIHYDROCHLORIDE 1 MG PO TABS
1.0000 mg | ORAL_TABLET | Freq: Three times a day (TID) | ORAL | 0 refills | Status: DC
Start: 2024-02-10 — End: 2024-05-26

## 2024-02-10 NOTE — Progress Notes (Signed)
 Casey [name] [id number] 1945/09/04 78 y.o.    Subjective:   Patient ID:  Casey [name] is a 78 y.o. (DOB January 15, 1946) male.  Chief Complaint:  Chief Complaint  Patient presents with   Follow-up    Bipolar depression and panic    HPI  Casey [name] presents for follow-up of bipolar disorder and anxiety and history of confusion  At visit  Dec 02, 2018.  He was having confusion and benztropine  was stopped to see if that was the cause.  Also it was having balance issues and therefore Trileptal  was switched all to nighttime 1200 mg nightly. Confusion much better per both of them with DC benztropine .  Balance better with change Trileptal .   visit was January 13, 2019 and he was doing well as noted above and he was encouraged not to make further med changes at that time.  visit April 13, 2019.  He was doing better with regard to depression than he had in quite some time on the Wellbutrin  and no meds were changed.  He was having health problems with pulmonary effusion and fluid removed twice related to fall in January 2020.  seen September 13, 2019.  For residual depression the following decisions were made: Balance problem much improved with dosing Trileptal  all in the evening. Depression under partial control & wife wants it to be better per his wife.  No mood swings.   Increase bupropion  XL to 2 of the 150 mg tablets in the morning until the current bottle is gone. Then new RX will be 1 of the 300 mg tablets each morning.  November 08, 2019 appointment the following is noted:  Seen with wife, Casey [name]. Small amount of improvement with change.  Anxiety got worse.  Notices when he goes out.  Worries about social and political things.  Wife keeps him active at home.  Allerigies bother him..  No sig caffeine except tea when goes out.  Wife sees him as a little more active and initiative.  More productive.  Started some cooking.  Getting out and up better.  A little more talkative.   He wants to be  hyper but we know what happens. Wife Casey [name] thinks   But for the last 2 years he's done nothing and laid around all the time.  He's a lot more active now. Big improvement. Went to church first time in 2 years. Plan:  For TRD, Pramipexole  0.25 mg tablet 1/2 twice daily for 1 week then 1 twice daily.  12/20/2019 appointment with the following noted:  Seen with wife. A little better motivated to get out but not to get in crowds.  Wife agrees more motivated and went to church once alone without pressure.  Wife says he's more talkative and much  Better interest.  Still not motivated to do things like mow the grass.  Others's have seen a difference. Concentration and memory are improved but not normal. No Se pramipexole .  Getting out of bed earlier and better now.. Wife admisters meds bc earlier he messsed them up. Plan: Wife has seen a big improvement therefore Partial improvement is clear from this so increase Pramipexole  0.375 mg tablet  twice daily  Anxiety is not fully managed.  Partly related to depression. Tremor is manageable.    02/15/20 appt with the following noted:  Send with wife Casey [name] Doing good.  Increased pramipexole  as indicated.  Tolerating meds Wellbutrin  XL 300 mg AM & Trileptal  1200 mg HS with it. Much better with  depression.  Stays up more and speinding less time in bed.   Wife agrees he's much better and handling things better. 2 1/2 yo 4th cousin girl drowned in pond lately.  Big family. Wife pleased he handled it better. SE constipation ? Related. History Linzess  but diarrhea. He's not highly motivated but it is better.  Depression is much improved.  Had been depressed for years. Plan no med changes  06/13/2020 appointment with the following noted: Pretty good but cardiologist noted today increased pulse needing treatment.  Uncontrolled afib even after ablation. Hard to tell about depression bc of fatigue Dt heart problems. Wife said he did well Thanksgiving and participated  more in conversation per wife. Plan: no med changes  09/06/2020 appointment with following noted: Remains on Wellbutrin  XL 300 mg every morning, ginkgo biloba 40 mg twice daily, oxcarbazepine  1200 mg nightly, pramipexole  0.75 mg twice daily. Less sig health concerns. He thinks mood has been pretty good.  Consistent with meds. Wife thinks he stays tired and sleepy and wonders if can give him a stimulant.   Sleep good.  Will nap.  Doesn't bother him.  Walks dog 20 min in AM.  Not much initiative per wife.  Not depressed but when depressed he stayed in the bed lasting a couple of years.  History of OSA but lost weight and doesn't think he has apnea anymore.  No CPAP. Chronic afib. Not much caffeine. Plan: Wife& patient has seen a big improvement with Pramipexole  0.375 mg tablet  twice daily They ask consideration be given to the following: If his energy gets better by treating his tachycardia they would like to consider reducing the pramipexole  or possibly eliminating it at follow-up.   For energy trial reduction pramipexole  to 0.25 mg BID.  If gets more depressed call. Alternative of modafinil  100  12/04/2020 appointment with the following noted: Energy is better with awakening earlier at 8 and doing more and getting out more.  Going to bed later with 8 hour  Sleep. Mood fluctuates with more reacting to wife mainly.  More outspoken.  Per W he has to vent.  She thinks he is too focused on politics and issues.   Plan: Depression under control.  No mood swings.   Continue bupropion  XL 300 mg tablets each morning. Continue Trileptal  600 mg 2 at HS. No higher DT anxiety risk and no traditional stimulants DT afib.  Wife & patient has seen a big improvement with Pramipexole  and able to reduce to  0.25 mg tablet  twice daily  02/20/2021 appointment with the following noted:  seen with wife Prednisone  made him real hyper.  Been off of it for at least a couple weeks.  Hyperverbal and hyperactive.  Hip  pain interferes with sleep.  Sees doctor tomorrow. Average 3-4 hours at night and no naps bc busy.  No excessive spending but more than usual.  Denies appetite disturbance.  Patient reports that energy and motivation have been reduced.  Patient has difficulty with concentration and memory.  Patient denies any suicidal ideation.  W had MI in June. A/P: mild mania Wean Wellbutrin  over 2 weeks. Continue Trileptal  600 mg 2 at HS. Wife & patient has seen a big improvement with Pramipexole  and able to reduce to  0.25 mg tablet  twice daily. Continue it unless mania does not resolve with less bupropion .  03/26/2021 phone call: Casey [name] stated Casey [name] has slightly improved since last week.He has not been able to sleep and was up at 2  am texting people in a disoriented state.His pcp put him on Ambien 10 mg and this has helped him sleep.However,Casey [name] stated he is still working him self too much.She said he is jittery and has to have something to do at all times.He does not want to slow down or get rest.She thinks he needs to cutback on the Mirapex  as well. MD response:  I agree with his wife about reducing Mirapex  from 1 tablet twice a day to half tablet twice a day for 1 week and if his symptoms are not resolved and stop the Mirapex .  04/18/2021 phone call with wife and MD: Patient is still manic after stopping Wellbutrin  and pramipexole .  Wife's concern is hard to get him to settle down at night.  Reviewed with wife prior psychiatric medications which are multiple.  Typically one would use an antipsychotic in this case but she does not think that they worked in the past.  She says he has been on higher doses of Trileptal  and tolerated it. Increase Trileptal  to 600 mg every morning and 1200 mg nightly for mania.  Keep scheduled appointment.  05/02/2021 wife reports patient still manic: We cannot go higher in the Trileptal  because of balance issues.  Therefore olanzapine  10 mg nightly was started and we discussed the  option of hospitalization.  We will try to get the patient in urgently to an appointment.  05/07/2021 appointment with the following noted: seen with wife, Casey [name] Not doing too well.  Trouble getting out of bed.  Wife says he's still up in middle of the night but he says he's sleeping better.  Sleep in recliner downstairs. She says he's still manic with hyperactive, hyper graphia but won't show her what he's writing. On olanzapine   No changes off Wellbutrin  and pramipexole . Apparently more balance problems with increase Trileptal  to 1800 mg daily.  Dropped back to 1200 mg HS. Admits to some confusion.  Has pulled off labels on bottle per wife. She thinks he's averaging 4-6 hours of sleep and a little better than it was. Wife said he started cussing and he has not done that in the past. Other episodes of inappropriate decision making.  He recognizes he's manic but can't control himself.  Hyperverbal, pressured.  In past had speech problems with mania also.  Neg neuro work up for stroke in the past. SE balance and tremor. Some trouble getting out of chair? Plan: For mania,Increase olanzapine  to 15 mg PM Follow-up Friday for urgent visit  05/11/2021 appointment with the following noted: Calming down a little. He thinks he's sleeping better.  She says he needs whole Ambien with olanzapine  and last night still not sleeping.  He woke her with complaints of back pain. She's not seeing SE and nor is he.  She's not seeing much change  He says back pain is complicating his sleep and his problem. He tries to stay active.   She says he talks incessantly but not as much to her. She says major life events have triggered mania in him in the past. She notices him making poor decisions. Plan: Increase olanzapine  to 20 mg PM  05/24/2021 appointment with the following noted: with D Vina A week ago went of to doctor's office alone and wife called bc he was agitated. Per wife still going wide open but is  sleeping better. Yesterday had fever and talking out of his head and dx UTI. He still feels driven to do chores but distractible and inefficient.   D does  not see improvement and nor does wife.   7 hour sleep lately. Plan: Increase olanzapine  to 30 mg in evening Reduce oxcarbazepine  to 1 and 1/2 tablets in the evening.  06/04/2021 appointment: Seen with wife Sleeping more 7-9 hours.   Per wife still some confusion and still short tempered.  Less pressured sleep. He won't go to bed when sleepy and waits too long to get in bed and is weak and cannot do it by himself. Wife agrees he's less wide open than before and will be able to sit and watch TV show now. B died yesterday. Plan: Continue olanzapine  to 30 mg in evening Reduce oxcarbazepine   again to 1 tablets in the evening for 1 week, then 1/2 tablet at night for a week then stop it.  07/04/21 appt noted:  seen with wife Casey [name] Hyper-ness is better.  Some trouble with concentration but able to make peanut butter fudge. Problems with afib.   Sleeping a lot and some instability.  Goes to sleep in chair.  Better with irritability.  No longer talking constantly but still some in spells.   Not depressed.   No change in balance off oxcarbazepine . Wife said last week mixed up meds and got oversedated.  She discovered the option. Plan: Reduce olanzapine  to 20 mg PM now that mania is largely resolved.  08/06/2021 appointment with the following noted: Only psych med is reduced olanzapine  20 mg HS RLS and fidgety.  All day.  Can sit for an hour.  Uncomfortable when sitting. Stopped Ginko and wife noticed RLS more.  She restarted it.  No longer rocking back and forth. Caffeine tea at lunch.  No coffee..  [place] some. RLS recently started. Bipolar sx great per wife.  Not wanting to go out much. CO tiredness.  Falls asleep in recliner.  Sleep all night. Eats sweets all his life. Plan: No longer manic. Reduce olanzapine  to 15 mg earlier in  PM Stop caffeine  09/06/2021 appointment with the following noted: Reduced olanzapine  15 mg and still RLS all the day. No change in that.  But can sit in church ok.  Will move legs when standing and talking to people. Sleeping more than he should from 10 until 9. And still naps and little initiative bc tired. Not depressed but not back to normal.  Not as social as usual. Loves to eat out. Plan: Reduce olanzapine  from 15 to 7.5 mg earlier in PM Stop caffeine  10/02/2021 appointment with the following noted: seen again with wife Casey [name] Only psych med is the reduced dose of olanzapine  7.5 mg for 30 days. Back pain limits activity and getting shots. Taking tramadol for pain.  1-2 prn.  One helps. 2 is sedating. RLS is better and mainly now the problem is back pain.  Has to sleep in recliner DT pain.   Casey [name] says hard to tell about effect from reduced olanzapine  bc he's having such back pain and taking tramadol.  Doesn't feel like doing anything. Plan continue olanzapine  7.5 mg every afternoon  12/04/2021 appointment with the following noted: seen with wife Still doing well with olanzapine  7.5 mg PM No SE.  RLS is OK now. Sleep good 8-10 hours. Not depressed nor manic sx.   Happy with meds.   Wife gall bladder surgery. H helping wife. Plan: olanzapine  7.5 mg earlier in PM Stopcaffeine   03/19/2022 phone call: Wife called complaining that her husband sleeping 14 to 15 hours a day on olanzapine  7.5 mg daily and wondering  about trying to reduce it. MD response: Olanzapine  is his only mood stabilizer at present.  There is a risk that he could get manic again with the dosage reduction.  But we can try reducing from 7.5 to 5 mg at night.  That may or may not help with his tendency to sleep excessively.  Some people will do that even on low doses of olanzapine .  Please send in prescription for olanzapine  5 mg tablets 1 nightly #30, 1 additional refill and make a note in the pharmacy section of the  order to cancel any other olanzapine  refills.     06/04/2022 appointment noted:  wife says a tad better with less olanzapine  but still wants to sleep and sit around a lot. Keeps saying I'm not depressed.   Reduced olanzapine  5 mg HS. No mania or depression. Hx sleep study and dx OSA but per wife got better with wt loss.  Not using CPAP. Eating less but wt loss stable. Asks about seasonal pattern bc of the history.  She can't remember the seasonal pattern.   Plan: Reduce  DT sleepiness olanzapine  2.5 mg earlier in PM Mild TD   07/29/22 TC: no changes with reduction so increased back to 5 mg olanzapine .  08/14/22 appt noted: Still sleeping too much 12 hours.   Also been irritable with flashes.   Something can set him off. Probably getting depressed some with less olanzapine .  Some anxiety.   Some reduction in interest and not going to church like before and doesn't like to be around people.  Doesn't want wife to help others either.   No restlessness.  Will nap in the afternoon. Not enjoying things and not laughing. Plan: Is getting more depressed again, resume Wellbutrin  XL up to 300 mg AM  10/14/22 appt noted:  with wife Casey [name] Psych Med: olanzapine  5 HS, Wellbutrin  300 mg AM Feels a little more like getting out but wife says not much.  She sees him being a little more agitated and irritable.  Still doesn't want to go out normally or be with other people.  Usual self is social.  Don't talk as much.  Doesn't want to be alone and doesn't want wife to be social. Sleeps a lot.  Partly DT back pain. Not nervous or anxious. Plan: Is having apathetic depressed again,  and failed Wellbutrin .   Resume pramipexole  off label which worked before,  0.25 mg BID for 4 days then 0.375 mg BID  10/23/22 TC: still dep and wanting to increase pramipexole .  MD resp: incr to 0.5 mg BID  11/24/22 and neuro FU 01/08/23 noted:  follow-up for acute right frontal juxtacortical infarct most slightly due to A-fib due  to noncompliance with Xarelto .  Patient is here with his wife today.  He feels that he has returned to his baseline.   02/27/23 appt noted:  with Casey [name] Since stroke not back to church.  No energy and not wanting to do anything.  Sleep 12 hours and watches TV and recliner.  Wife says sleep excess no change since stroke.  Still dep.   Still wanting to isolate from others. No anxiety. Plan: Stop olanzapine  Start Vraylar 1 in the AM Reduce pramipexole  to 1 twice daily for 4 days then 1/2 twice daily for 4 days then stop it. This plan will reduce meds and answer question about sleepiness from olanzapine  and reduce polypharmacy  03/18/23 TC Wife reporting no improvement in depression and he is still sleeping all the time. She  is also reporting it is too expensive and they would not be able to afford it, but PA has not been done. Requesting to go back to previous medications.  CC resp: Me  to Con Casey [name], CMA  CC   03/19/23  6:39 PM Note It has not been long enough and I need to see him to find an alternative to Vraylar.  Stay on the Vraylar 1.5 mg daily until his appt later this month.  I don't want to resume olanzapine  which didn't work until we explore other options.     04/10/23 appt noted: seen with wife Casey [name] meds: Vraylar 1.5 mg daily.  No olanzapine , pramipexole  stopped. Sleeping about the same but easier to get up per wife on Vraylar.  No change in mood.  Still feels dep.  Not wanting to go out and be around people.  Without change.  No social life.  More HA.   Still dealing with chronic TR afib and tx ongoing. Easily out of breath.   Will walk dog 10 mins.   Tires him.  Sleeps a lot during the day. Hx OSA but since lost wt not needing CPAP Doesn't laugh much. Not going to church DT dep. Worry a lot without change.  Will resolve when not dep.   Plan: Stop Vraylar Start 3 of the 150 mg capsules of lithium  at night Get a blood level in the morning 5-7 days later Use modafinil   for alertness if needed  05/26/23 appt noted:  seen with D Vina Meds: lithium  450 HS, no Vraylar, modafinil  prn.   Lithium  level 0.4, Cr 1.34 pretty stable. Mood kind of OK.  Sleep is still too much.   Wife's notes no big change but is awake more than he was.  Hard to get OOB before 11.   EKG per card was normal QT but history permanent atrial fibrillation with hx stroke DT unintentional noncompliance with anticoagulation.  Plan:  lithium  450 HS.  Lithium  0.4.   Option modafinil  if not better in a couple of weeks.  Resume pramipexole  increase, to 0.375 mg BID off label for depression.  06/24/23 appt noted:  seen with wife, Casey [name] meds: Lithium  450 nightly, pramipexole  0.25 mg tablets 1-1/2 twice daily, no modafinil  100 mg every morning About the same but a little better.  Getting out and going more . Per wife seems some better but she doesn't think he's doing much  and not as much as normal.  Not sig more social and not normal for  him. No sig SE.   Physically is moving better.   To bed 9 and will get up at 930 am instead of 11.   Pramipexole  helped a little. Mild stroke in May.  Will have Watchman device placed. Plan: For TRD increase pramipexole  to 0.5 mg BID and then to previous dose of 0.75 mg BID  07/29/23 appt noted:  with wife Meds: incr pramipexole  to 0.75 mg BID, lithium  450 mg daily,  Per wife: Absolutely  re: improvement per wife.  Getting out more and more wsocial and motivated.  More household chores. He noticed I'm just myself.  Talking more.  More interested.  Dep resolved. Noticed benefit gradually over a couple of weeks. More involuntary tongue movements  09/01/23 appt noted: with wife Casey [name] Meds: as above. A little more active and getting out more and less depressed and more interested. SE tremor occ &  Tongue movements about the same and when nervous is worse. Sleep  well.  Wife says dep much better.  Some laughter occ which hasn't been there.  She  doesn't feel he is all the way well.  Still likes to sit around a lot. Not as consisstent with Xarelto  as he should be.  Had 2-3 ministrokes.  Pending surgery to keep that from being necessary. Plan incr pramipexole  1 mg BID  09/25/23 appt noted: Med: incr pramipexole  1 mg BID, lithium  150 mg 3 daily, modafinil  100 AM,  A little bit better.  Health concerns with ministrokes.  Pending Watchman insert for perm a fib.  W notes he's talking more and a little more active but still not motivated to go out much and doesn't want her to go out much.  He doesn't want to go out much until his health is better with breathing.   Not really dep but kind of down with health issues dragging out and work up dragging.  Worrying over health.  12/09/23 appt noted: Med:  pramipexole  1 mg BID, lithium  150 mg 3 daily, NO modafinil  100 AM,  Started having panic.  When goes walking and feels normal.  But can be triggered by heat and humidity with px breathing.  Happened going to [employer].  Lungs checked out fine.   Was going to have Watchman put in and that's when panic started.  So didn't get it done.  But panic continues.  Wife said panic can occur going in the restaurant but does seem to have trouble with exertion.  Not daily.  Only lasts 2-3 mins. Since stroke worrying about having ministrokes.   Not dep but wife notes he's not confident being alone.  Doesn't want to socialize.  Loves to go out and eat.   Plan: Sertraline  best antipanic opiton available low dose 25-50 mg  02/10/24 appt noted:  Med:  pramipexole  1 mg BID, lithium  150 mg 3 daily, NO modafinil  100 AM,  sertraline  50 Doing about the same overall but pill helped.   Was having SOB DT heat.  Doesn't like going outside.  Heat affects thinking and slows him down.  Reduced panic.  Much much better per wife with panic.   No known SE Mood affected by health concerns.  Awaiting prostate CA FU labs.   Loves to eat out but not socialize per wife and he doesn't  like her leaving.   He's concerned most o f her friends have CA.   Hasn't been to church in 2 years and used to be first one in the church.   Not doing anything in the house. Went to PCP last week and got B12 shot to see if energy is better.  No watchman but taking blood thinner.   Not sad.  Low interest except TV and news.  Used to have mor einterest and was out going but not now.   Wife notices lip smacking and protruding tongue.   Past Psychiatric Medication Trials: Seroquel  300,  olanzapine  30 resolved mania,  (Improved  with Increase olanzapine  to 30 mg PM and off the oxcarbazepine .) perphenazine, risperidone,  Latuda,   Abilify, Vraylar 1.5 mg for 1 month without benefit Lithium  did great helped but SE afib, kidneys and prostate,    alprazolam ,  Depakote, Trileptal  1800 balance problems  carbamazepine , lamotrigine  caused nausea,  citalopram, sertraline  150,    Wellbutrin  300 anxiety and irritability and failed 10/2022,   Pramipexole  0.375 BID helped intially, up to 1 BID Benztropine  confusion        1 prior psych hosp  depression                                                    Review of Systems:  Review of Systems  Constitutional:  Positive for fatigue.  HENT:         Clicking teeth  Eyes:  Negative for visual disturbance.  Respiratory:  Positive for shortness of breath. Negative for cough.   Gastrointestinal:  Positive for constipation.  Musculoskeletal:  Positive for arthralgias, back pain and gait problem.  Skin:  Negative for rash.       chronic  Neurological:  Positive for tremors and weakness.       Balance px longterm no worse More tongue movements  Psychiatric/Behavioral:  Negative for agitation, behavioral problems, confusion, decreased concentration, dysphoric mood, hallucinations, self-injury, sleep disturbance and suicidal ideas. The patient is nervous/anxious. The patient is not hyperactive.     Medications: I have reviewed the patient's current  medications.  Current Outpatient Medications  Medication Sig Dispense Refill   acetaminophen  (TYLENOL ) 500 MG tablet Take 1,000 mg by mouth every 6 (six) hours as needed for headache.     albuterol  (VENTOLIN  HFA) 108 (90 Base) MCG/ACT inhaler Inhale 2 puffs into the lungs every 6 (six) hours as needed (Asthma).     Cholecalciferol (VITAMIN D3) 250 MCG (10000 UT) capsule Take 10,000 Units by mouth daily.     levothyroxine  (SYNTHROID , LEVOTHROID) 25 MCG tablet Take 25 mcg by mouth daily before breakfast.     lithium  carbonate 150 MG capsule Take 3 capsules (450 mg total) by mouth at bedtime. 270 capsule 0   metoprolol  succinate (TOPROL -XL) 25 MG 24 hr tablet Take 1 tablet by mouth once daily 90 tablet 3   pantoprazole  (PROTONIX ) 40 MG tablet TAKE ONE TABLET BY MOUTH ONCE DAILY (Patient taking differently: Take 40 mg by mouth daily.) 30 tablet 11   pramipexole  (MIRAPEX ) 1 MG tablet Take 1 tablet (1 mg total) by mouth 2 (two) times daily. 180 tablet 0   rivaroxaban  (XARELTO ) 20 MG TABS tablet Take 20 mg by mouth daily after supper.      rosuvastatin  (CRESTOR ) 20 MG tablet Take 1 tablet (20 mg total) by mouth daily. 30 tablet 0   sertraline  (ZOLOFT ) 50 MG tablet TAKE 1 ONCE DAILY 30 tablet 0   SUPER B COMPLEX/C PO Take 1 tablet by mouth daily.     No current facility-administered medications for this visit.    Medication Side Effects: resolved  Allergies:  Allergies  Allergen Reactions   Amlodipine Other (See Comments)    Stopped due to kidney issues   Vicodin [Hydrocodone -Acetaminophen ] Other (See Comments)    Patient is unsure of reaction    Past Medical History:  Diagnosis Date   Arthritis    Asthma    Atrial fibrillation (HCC) 01/12/2010   2D Echo EF=>55%   Bipolar disorder (HCC)    Chronic back pain    Depression    Dysrhythmia    AFib   GERD (gastroesophageal reflux disease)    History of colonic polyps    History of gout    Hyperlipidemia    Hypertension     Hypothyroidism    Morbid obesity (HCC)    OSA (obstructive sleep apnea)    AHI was 27.62hr RDI was 34.3 hr REM 0.00hr; had CPAP years ago  but no longer uses.   Prostatitis    Tubular adenoma of colon 01/2016    Family History  Problem Relation Age of Onset   Diabetes Mother    Heart failure Mother    Hypertension Mother    Hypertension Father    Cancer Paternal Grandmother    Stroke Sister    Liver disease Brother    Vascular Disease Brother    Arthritis Brother    Colon cancer Neg Hx     Social History   Socioeconomic History   Marital status: Married    Spouse name: Not on file   Number of children: Not on file   Years of education: Not on file   Highest education level: Not on file  Occupational History   Not on file  Tobacco Use   Smoking status: Never   Smokeless tobacco: Never  Vaping Use   Vaping status: Never Used  Substance and Sexual Activity   Alcohol use: No    Alcohol/week: 0.0 standard drinks of alcohol   Drug use: No   Sexual activity: Yes    Birth control/protection: None  Other Topics Concern   Not on file  Social History Narrative   Not on file   Social Drivers of Health   Financial Resource Strain: Not on file  Food Insecurity: No Food Insecurity (11/24/2022)   Hunger Vital Sign    Worried About Running Out of Food in the Last Year: Never true    Ran Out of Food in the Last Year: Never true  Transportation Needs: No Transportation Needs (11/24/2022)   PRAPARE - Administrator, Civil Service (Medical): No    Lack of Transportation (Non-Medical): No  Physical Activity: Not on file  Stress: Not on file  Social Connections: Not on file  Intimate Partner Violence: Patient Declined (11/24/2022)   Humiliation, Afraid, Rape, and Kick questionnaire    Fear of Current or Ex-Partner: Patient declined    Emotionally Abused: Patient declined    Physically Abused: Patient declined    Sexually Abused: Patient declined    Past Medical  History, Surgical history, Social history, and Family history were reviewed and updated as appropriate.   Please see review of systems for further details on the patient's review from today.   Objective:   Physical Exam:  There were no vitals taken for this visit.  Physical Exam Constitutional:      General: He is not in acute distress. Musculoskeletal:        General: No deformity.  Neurological:     Mental Status: He is alert and oriented to person, place, and time.     Coordination: Coordination abnormal.     Gait: Gait normal.     Comments: Mild rocking motions legs resolved Mild + lip licking and buccal movements ongoing Uses cane  Psychiatric:        Attention and Perception: He is attentive. He does not perceive auditory hallucinations.        Mood and Affect: Mood is anxious. Mood is not depressed. Affect is not labile, blunt, angry or inappropriate.        Speech: Speech is not rapid and pressured or tangential.        Behavior: Behavior normal. Behavior is not agitated.        Thought Content: Thought content normal. Thought content is not delusional. Thought content does not include homicidal or suicidal ideation. Thought content does not include suicidal plan.  Cognition and Memory: Cognition normal.        Judgment: Judgment normal.     Comments: Insight fair.   No auditory or visual hallucinations. No delusions.  More anxious Gait is normal again. Lip smacking worse with pramipexole  More talkative , engaged, interested, not as blunted but still socially avoidant     Lab Review:     Component Value Date/Time   NA 144 08/22/2023 1229   K 4.3 08/22/2023 1229   CL 106 08/22/2023 1229   CO2 24 08/22/2023 1229   GLUCOSE 90 08/22/2023 1229   GLUCOSE 104 (H) 12/29/2022 0443   BUN 16 08/22/2023 1229   CREATININE 1.51 (H) 08/22/2023 1229   CALCIUM  9.5 08/22/2023 1229   PROT 7.0 11/24/2022 1252   PROT 6.6 12/28/2018 0941   ALBUMIN 3.9 11/24/2022 1252    ALBUMIN 4.5 12/28/2018 0941   AST 18 11/24/2022 1252   ALT 13 11/24/2022 1252   ALKPHOS 70 11/24/2022 1252   BILITOT 0.8 11/24/2022 1252   BILITOT 0.7 12/28/2018 0941   GFRNONAA >60 12/29/2022 0443   GFRAA 63 12/28/2018 0941       Component Value Date/Time   WBC 7.7 08/22/2023 1229   WBC 5.7 12/29/2022 0443   RBC 4.23 08/22/2023 1229   RBC 4.23 12/29/2022 0443   HGB 13.6 08/22/2023 1229   HCT 41.6 08/22/2023 1229   PLT 181 08/22/2023 1229   MCV 98 (H) 08/22/2023 1229   MCH 32.2 08/22/2023 1229   MCH 31.9 12/29/2022 0443   MCHC 32.7 08/22/2023 1229   MCHC 33.7 12/29/2022 0443   RDW 12.4 08/22/2023 1229   LYMPHSABS 0.7 12/29/2022 0443   LYMPHSABS 1.1 12/28/2018 0941   MONOABS 0.5 12/29/2022 0443   EOSABS 0.2 12/29/2022 0443   EOSABS 0.1 12/28/2018 0941   BASOSABS 0.0 12/29/2022 0443   BASOSABS 0.0 12/28/2018 0941    Lithium  Lvl  Date Value Ref Range Status  04/22/2023 0.4 (L) 0.5 - 1.2 mmol/L Final    Comment:    A concentration of 0.5-0.8 mmol/L is advised for long-term use; concentrations of up to 1.2 mmol/L may be necessary during acute treatment.                                  Detection Limit = 0.1                           <0.1 indicates None Detected      01/13/24 high PSA 11.4 with bx pendinding  No results found for: PHENYTOIN, PHENOBARB, VALPROATE, CBMZ   .res Assessment: Plan:    Stefanos was seen today for follow-up.  Diagnoses and all orders for this visit:  Bipolar affective disorder, currently depressed, moderate (HCC)  Panic disorder without agoraphobia  Generalized anxiety disorder  Hypersomnolence disorder, acute, moderate  Obstructive sleep apnea syndrome  Lithium  use  History of stroke with residual effects    Likely TD   30 min face to face time with patient and wife. We discussed Dx bipolar at 19 yo.  Bipolar depression has recurred again like last year but so far is not as severe.  Patient is very complicated because  of weakness and medical problems.   History pseudodementia with depression. Prednisone  triggered some mania in 2023 and persisted off prednisone .    Recent history of afib with secondary stroke.  Dep partially better again  with pramipexole  1 mg BID so increase to 1 mg TID  Unclear reason for new onset panic.  Worrying over W.  Some health fears.  ? Re: pramipexole .   Now better with sertraline   Mild lip smacking consistent with TD and appears worse with addtion of prampexole..   But is not disturbing to pt nor his wife.  If still a problem with sleepiness DT multiple med failures consider modafinil  to manage.  Cr is abnormal.  Failed multiple alternatives.  Disc risks lithium  but it is what worked best.  Will try LED DT medical problems including chronic afib. Counseled patient regarding potential benefits, risks, and side effects of lithium  to include potential risk of lithium  affecting thyroid  and renal function.  Discussed need for periodic lab monitoring to determine drug level and to assess for potential adverse effects.  Counseled patient regarding signs and symptoms of lithium  toxicity and advised that they notify office immediately or seek urgent medical attention if experiencing these signs and symptoms.  Patient advised to contact office with any questions or concerns.  h lithium  450 HS.   Lithium  0.4.  04/22/23. CR 1.45 on 05/26/23.  1.51 on 08/22/23 Cr 1.20 Jul 2023.  Disc this concern at length.  Might have to change again if progresses further. 07/01/23 Lithium  0.5.  no dosage change.  Had tremor at higher doses  Check lithium  level.   Option modafinil  if not better .  defer Normal Qtc on EKG since here but afib.  Marked improvement dep For TRD after increase pramipexole  to 1mg  BID but not  back to normal. Defer further changes until immed med concerns settled. Disc SE including manic risk, but dep interfering with tx  Re: panic hesitant to add more meds but may be necessary.    Dont want to use BZ unless essential. Sertraline  best antipanic opiton available low dose 25-50 mg Disc SE incl mania risk  Follow-up 2 mos   Lorene Macintosh, MD, DFAPA   Please see After Visit Summary for patient specific instructions.   Future Appointments  Date Time Provider Department Center  07/27/2024 11:15 AM Matilda Senior, MD [facility] None      No orders of the defined types were placed in this encounter.      -------------------------------

## 2024-02-18 ENCOUNTER — Ambulatory Visit (HOSPITAL_COMMUNITY)
Admission: RE | Admit: 2024-02-18 | Discharge: 2024-02-18 | Disposition: A | Discharge status: Home / Self Care | Source: Ambulatory Visit | Attending: Urology | Admitting: Urology

## 2024-02-18 DIAGNOSIS — K449 Diaphragmatic hernia without obstruction or gangrene: Secondary | ICD-10-CM | POA: Diagnosis not present

## 2024-02-18 DIAGNOSIS — C61 Malignant neoplasm of prostate: Secondary | ICD-10-CM | POA: Diagnosis not present

## 2024-02-18 DIAGNOSIS — K8020 Calculus of gallbladder without cholecystitis without obstruction: Secondary | ICD-10-CM | POA: Diagnosis not present

## 2024-02-18 DIAGNOSIS — N200 Calculus of kidney: Secondary | ICD-10-CM | POA: Diagnosis not present

## 2024-02-18 MED ORDER — IOHEXOL 300 MG/ML  SOLN
80.0000 mL | Freq: Once | INTRAMUSCULAR | Status: AC | PRN
  Administered 2024-02-18: 80 mL via INTRAVENOUS

## 2024-02-24 ENCOUNTER — Other Ambulatory Visit: Payer: Self-pay | Admitting: Urology

## 2024-02-24 DIAGNOSIS — C61 Malignant neoplasm of prostate: Secondary | ICD-10-CM

## 2024-02-25 DIAGNOSIS — Z79899 Other long term (current) drug therapy: Secondary | ICD-10-CM | POA: Diagnosis not present

## 2024-02-25 DIAGNOSIS — F3132 Bipolar disorder, current episode depressed, moderate: Secondary | ICD-10-CM | POA: Diagnosis not present

## 2024-02-26 LAB — BASIC METABOLIC PANEL WITH GFR
BUN/Creatinine Ratio: 14 (ref 10–24)
BUN: 17 mg/dL (ref 8–27)
CO2: 22 mmol/L (ref 20–29)
Calcium: 9 mg/dL (ref 8.6–10.2)
Chloride: 105 mmol/L (ref 96–106)
Creatinine, Ser: 1.23 mg/dL (ref 0.76–1.27)
Glucose: 118 mg/dL — ABNORMAL HIGH (ref 70–99)
Potassium: 3.8 mmol/L (ref 3.5–5.2)
Sodium: 142 mmol/L (ref 134–144)
eGFR: 61 mL/min/1.73 (ref >59)

## 2024-02-26 LAB — LITHIUM LEVEL: Lithium Lvl: 0.2 mmol/L — ABNORMAL LOW (ref 0.5–1.2)

## 2024-03-01 ENCOUNTER — Telehealth: Payer: Self-pay | Admitting: Radiation Oncology

## 2024-03-01 NOTE — Telephone Encounter (Signed)
 8/18 @ 11:03 am Left voicemail with patient and patient's spouse for patient to call our office to be sch for consult.

## 2024-03-04 DIAGNOSIS — C44212 Basal cell carcinoma of skin of right ear and external auricular canal: Secondary | ICD-10-CM | POA: Diagnosis not present

## 2024-03-04 DIAGNOSIS — D0421 Carcinoma in situ of skin of right ear and external auricular canal: Secondary | ICD-10-CM | POA: Diagnosis not present

## 2024-03-07 ENCOUNTER — Emergency Department (HOSPITAL_COMMUNITY)
Admission: EM | Admit: 2024-03-07 | Discharge: 2024-03-07 | Disposition: A | Discharge status: Home / Self Care | Attending: Emergency Medicine | Admitting: Emergency Medicine

## 2024-03-07 ENCOUNTER — Other Ambulatory Visit: Payer: Self-pay

## 2024-03-07 ENCOUNTER — Encounter (HOSPITAL_COMMUNITY): Payer: Self-pay | Admitting: Emergency Medicine

## 2024-03-07 DIAGNOSIS — Z7901 Long term (current) use of anticoagulants: Secondary | ICD-10-CM | POA: Diagnosis not present

## 2024-03-07 DIAGNOSIS — J45909 Unspecified asthma, uncomplicated: Secondary | ICD-10-CM | POA: Insufficient documentation

## 2024-03-07 DIAGNOSIS — L7621 Postprocedural hemorrhage and hematoma of skin and subcutaneous tissue following a dermatologic procedure: Secondary | ICD-10-CM | POA: Diagnosis not present

## 2024-03-07 DIAGNOSIS — E039 Hypothyroidism, unspecified: Secondary | ICD-10-CM | POA: Insufficient documentation

## 2024-03-07 DIAGNOSIS — I10 Essential (primary) hypertension: Secondary | ICD-10-CM | POA: Diagnosis not present

## 2024-03-07 DIAGNOSIS — T148XXA Other injury of unspecified body region, initial encounter: Secondary | ICD-10-CM

## 2024-03-07 DIAGNOSIS — S01311A Laceration without foreign body of right ear, initial encounter: Secondary | ICD-10-CM | POA: Diagnosis not present

## 2024-03-07 MED ORDER — LIDOCAINE HCL (PF) 1 % IJ SOLN
5.0000 mL | Freq: Once | INTRAMUSCULAR | Status: AC
  Administered 2024-03-07: 5 mL
  Filled 2024-03-07: qty 5

## 2024-03-07 MED ORDER — LIDOCAINE HCL (PF) 1 % IJ SOLN
INTRAMUSCULAR | Status: AC
  Administered 2024-03-07: 5 mL
  Filled 2024-03-07: qty 5

## 2024-03-07 NOTE — ED Notes (Signed)
 Bleeding to R ear remains stopped at this time.  Family educated on prior care at home.  Understanding voiced

## 2024-03-07 NOTE — ED Triage Notes (Signed)
 Patient coming to ED for evaluation of bleeding behind R ear s/p having procedure completed on Thursday to have basal cell carcinoma removed.  Tonight had changed the bandage and he woke with bleeding.  States he is currently on blood thinner.

## 2024-03-07 NOTE — ED Provider Notes (Addendum)
 [facility] Emergency Department Provider Note MRN:  [id number]  Arrival date & time: 03/07/24     Chief Complaint   Post-op Problem   History of Present Illness   Casey [name] is a 77 y.o. year-old male with a history of A-fib on Xarelto  presenting to the ED with chief complaint of postop problem.  Undergoing surgery for removal of basal cell carcinoma skin cancer to the right ear.  Today he was advised to remove his dressing and so he did so before bed.  Woke up in a pool of blood.  Could not stop the bleeding from his ear wound, here for evaluation.  Review of Systems  A thorough review of systems was obtained and all systems are negative except as noted in the HPI and PMH.   Patient's Health History    Past Medical History:  Diagnosis Date   Arthritis    Asthma    Atrial fibrillation (HCC) 01/12/2010   2D Echo EF=>55%   Bipolar disorder (HCC)    Chronic back pain    Depression    Dysrhythmia    AFib   GERD (gastroesophageal reflux disease)    History of colonic polyps    History of gout    Hyperlipidemia    Hypertension    Hypothyroidism    Morbid obesity (HCC)    OSA (obstructive sleep apnea)    AHI was 27.62hr RDI was 34.3 hr REM 0.00hr; had CPAP years ago but no longer uses.   Prostatitis    Tubular adenoma of colon 01/2016    Past Surgical History:  Procedure Laterality Date   APPENDECTOMY  1959   asthma     CATARACT EXTRACTION Bilateral 1995   COLONOSCOPY  2011   RMR: left-sided diverticula, minimal rectal friability. No polyps. Repeat 5 years.   COLONOSCOPY WITH PROPOFOL  N/A 01/15/2016   Dr.Rourk- diverticulosis in the entire examined colon, multiple rectal and colonic polyps, internal hemorrhoids. bx= tubular adenomas and hyperplastic polyps.    COLONOSCOPY WITH PROPOFOL  N/A 03/13/2022   Procedure: COLONOSCOPY WITH PROPOFOL ;  Surgeon: [name], MD;  Location: [facility];  Service: Endoscopy;  Laterality: N/A;  11:00am    HAND / FINGER LESION EXCISION Right    IR THORACENTESIS ASP PLEURAL SPACE W/IMG GUIDE  03/01/2019   IR THORACENTESIS ASP PLEURAL SPACE W/IMG GUIDE  03/31/2019   POLYPECTOMY  01/15/2016   Procedure: POLYPECTOMY;  Surgeon: Lamar HERO Shaaron, MD;  Location: [facility];  Service: Endoscopy;;   POLYPECTOMY  03/13/2022   Procedure: POLYPECTOMY;  Surgeon: [name], MD;  Location: [facility];  Service: Endoscopy;;   right shoulder surgery     TONSILLECTOMY  1955    Family History  Problem Relation Age of Onset   Diabetes Mother    Heart failure Mother    Hypertension Mother    Hypertension Father    Cancer Paternal Grandmother    Stroke Sister    Liver disease Brother    Vascular Disease Brother    Arthritis Brother    Colon cancer Neg Hx     Social History   Socioeconomic History   Marital status: Married    Spouse name: Not on file   Number of children: Not on file   Years of education: Not on file   Highest education level: Not on file  Occupational History   Not on file  Tobacco Use   Smoking status: Never   Smokeless tobacco: Never  Vaping Use   Vaping status: Never Used  Substance and Sexual Activity   Alcohol use: No    Alcohol/week: 0.0 standard drinks of alcohol   Drug use: No   Sexual activity: Yes    Birth control/protection: None  Other Topics Concern   Not on file  Social History Narrative   Not on file   Social Drivers of Health   Financial Resource Strain: Not on file  Food Insecurity: No Food Insecurity (11/24/2022)   Hunger Vital Sign    Worried About Running Out of Food in the Last Year: Never true    Ran Out of Food in the Last Year: Never true  Transportation Needs: No Transportation Needs (11/24/2022)   PRAPARE - Administrator, Civil Service (Medical): No    Lack of Transportation (Non-Medical): No  Physical Activity: Not on file  Stress: Not on file  Social Connections: Not on file  Intimate Partner Violence: Patient  Declined (11/24/2022)   Humiliation, Afraid, Rape, and Kick questionnaire    Fear of Current or Ex-Partner: Patient declined    Emotionally Abused: Patient declined    Physically Abused: Patient declined    Sexually Abused: Patient declined     Physical Exam   Vitals:   03/07/24 0323  BP: (!) 140/69  Pulse: 80  Resp: 16  Temp: 98.8 F (37.1 C)  SpO2: 97%    CONSTITUTIONAL: Well-appearing, NAD NEURO/PSYCH:  Alert and oriented x 3, no focal deficits EYES:  eyes equal and reactive ENT/NECK:  no LAD, no JVD CARDIO: Regular rate, well-perfused, normal S1 and S2 PULM:  CTAB no wheezing or rhonchi GI/GU:  non-distended, non-tender MSK/SPINE:  No gross deformities, no edema SKIN: Fresh wound crater to the right pinna with active bleeding, small pulsatile blood vessel   *Additional and/or pertinent findings included in MDM below  Diagnostic and Interventional Summary    EKG Interpretation Date/Time:    Ventricular Rate:    PR Interval:    QRS Duration:    QT Interval:    QTC Calculation:   R Axis:      Text Interpretation:         Labs Reviewed - No data to display  No orders to display    Medications  lidocaine  (PF) (XYLOCAINE ) 1 % injection 5 mL (5 mLs Other Given 03/07/24 0435)     Procedures  /  Critical Care .Laceration Repair  Date/Time: 03/07/2024 3:45 AM  Performed by: Theadore Ozell HERO, MD Authorized by: Theadore Ozell HERO, MD   Consent:    Consent obtained:  Verbal   Consent given by:  Patient   Risks, benefits, and alternatives were discussed: yes     Risks discussed:  Infection, need for additional repair, nerve damage, poor wound healing, poor cosmetic result, pain, retained foreign body, tendon damage and vascular damage Universal protocol:    Procedure explained and questions answered to patient or proxy's satisfaction: yes     Immediately prior to procedure, a time out was called: yes     Patient identity confirmed:  Verbally with  patient Anesthesia:    Anesthesia method:  Local infiltration   Local anesthetic:  Lidocaine  1% w/o epi Laceration details:    Location:  Ear   Ear location:  R ear   Length (cm):  2 Pre-procedure details:    Preparation:  Patient was prepped and draped in usual sterile fashion Exploration:    Hemostasis achieved with:  Tied off vessels   Wound  exploration: wound explored through full range of motion and entire depth of wound visualized   Skin repair:    Repair method:  Sutures and tissue adhesive   Suture size:  5-0   Suture material:  Fast-absorbing gut   Suture technique:  Figure eight and simple interrupted   Number of sutures:  6 Comments:     Hemostasis achieved after 2 sutures.  About 30 minutes later patient had return of bleeding requiring 4 more sutures.  With hemostasis achieved this second time, the wound was then covered with Dermabond to help ensure no further rebleeding.   ED Course and Medical Decision Making  Initial Impression and Ddx Bleeding from wound, small arterial bleed, hemostasis achieved with figure-of-eight stitch.  Past medical/surgical history that increases complexity of ED encounter: Anticoagulated  Interpretation of Diagnostics Laboratory and/or imaging options to aid in the diagnosis/care of the patient were considered.  After careful history and physical examination, it was determined that there was no indication for diagnostics at this time.  Patient Reassessment and Ultimate Disposition/Management     Continued hemostasis on reassessment, appropriate for discharge.  Patient management required discussion with the following services or consulting groups:  None  Complexity of Problems Addressed Acute illness or injury that poses threat of life of bodily function  Additional Data Reviewed and Analyzed Further history obtained from: Further history from spouse/family member  Additional Factors Impacting ED Encounter Risk None  Ozell HERO. Theadore, MD [facility] Emergency [facility] [email or web address]  Final Clinical Impressions(s) / ED Diagnoses     ICD-10-CM   1. Bleeding from wound  [email or web address]       ED Discharge Orders     None        Discharge Instructions Discussed with and Provided to Patient:     Discharge Instructions      You were evaluated in the Emergency Department and after careful evaluation, we did not find any emergent condition requiring admission or further testing in the hospital.  Your exam/testing today is overall reassuring.  We stopped your bleeding here in the emergency department with absorbable stitches, they will not need to be removed.  Follow-up with your surgeon.  Please return to the Emergency Department if you experience any worsening of your condition.   Thank you for allowing us  to be a part of your care.       Theadore Ozell HERO, MD 03/07/24 0400    Theadore Ozell HERO, MD 03/07/24 [phone]

## 2024-03-07 NOTE — Discharge Instructions (Signed)
 You were evaluated in the Emergency Department and after careful evaluation, we did not find any emergent condition requiring admission or further testing in the hospital.  Your exam/testing today is overall reassuring.  We stopped your bleeding here in the emergency department with absorbable stitches, they will not need to be removed.  Follow-up with your surgeon.  Please return to the Emergency Department if you experience any worsening of your condition.   Thank you for allowing us  to be a part of your care.

## 2024-03-11 NOTE — Progress Notes (Addendum)
 GU Location of Tumor / Histology: Prostate Ca  If Prostate Cancer, Gleason Score is (4 + 3) and PSA is (11.4 on 01/13/2024)  Casey [name] presented as referral from Dr. Garnette Shack [facility] [place]) elevated PSA.  Biopsies    02/18/2024 Dr. Garnette Shack CT Abdomen Pelvis with Contrast CLINICAL DATA:  Intermediate risk prostate carcinoma. Staging. * Tracking Code: BO *  IMPRESSION: No evidence of metastatic disease within the abdomen or pelvis. Cholelithiasis. No radiographic evidence of cholecystitis. Bilateral nephrolithiasis. No evidence of ureteral calculi or hydronephrosis.  Small hiatal hernia.    Past/Anticipated interventions by urology, if any:    Past/Anticipated interventions by medical oncology, if any: NA  Weight changes, if any: No  IPSS:  4 SHIM:  7  Bowel/Bladder complaints, if any:  No  Nausea/Vomiting, if any: No  Pain issues, if any:  right  ear basal cell excision only when touch.  SAFETY ISSUES: Prior radiation? No Pacemaker/ICD? No Possible current pregnancy? Male Is the patient on methotrexate? No  Current Complaints / other details:  None  30 minutes spent total, including time for meaningful use questions, reviewing medication, as well as spent in face-to-face time in nurse evaluation with the patient.

## 2024-03-17 ENCOUNTER — Encounter: Payer: Self-pay | Admitting: Radiation Oncology

## 2024-03-17 ENCOUNTER — Ambulatory Visit
Admission: RE | Admit: 2024-03-17 | Discharge: 2024-03-17 | Disposition: A | Discharge status: Home / Self Care | Source: Ambulatory Visit | Attending: Radiation Oncology | Admitting: Radiation Oncology

## 2024-03-17 VITALS — BP 129/76 | HR 72 | Temp 97.3°F | Resp 18 | Ht 72.0 in | Wt 192.6 lb

## 2024-03-17 DIAGNOSIS — I4891 Unspecified atrial fibrillation: Secondary | ICD-10-CM | POA: Diagnosis not present

## 2024-03-17 DIAGNOSIS — Z8673 Personal history of transient ischemic attack (TIA), and cerebral infarction without residual deficits: Secondary | ICD-10-CM | POA: Diagnosis not present

## 2024-03-17 DIAGNOSIS — K8020 Calculus of gallbladder without cholecystitis without obstruction: Secondary | ICD-10-CM | POA: Insufficient documentation

## 2024-03-17 DIAGNOSIS — N281 Cyst of kidney, acquired: Secondary | ICD-10-CM | POA: Insufficient documentation

## 2024-03-17 DIAGNOSIS — Z860101 Personal history of adenomatous and serrated colon polyps: Secondary | ICD-10-CM | POA: Diagnosis not present

## 2024-03-17 DIAGNOSIS — M954 Acquired deformity of chest and rib: Secondary | ICD-10-CM | POA: Diagnosis not present

## 2024-03-17 DIAGNOSIS — G4730 Sleep apnea, unspecified: Secondary | ICD-10-CM | POA: Diagnosis not present

## 2024-03-17 DIAGNOSIS — F319 Bipolar disorder, unspecified: Secondary | ICD-10-CM | POA: Insufficient documentation

## 2024-03-17 DIAGNOSIS — I10 Essential (primary) hypertension: Secondary | ICD-10-CM | POA: Diagnosis not present

## 2024-03-17 DIAGNOSIS — N200 Calculus of kidney: Secondary | ICD-10-CM | POA: Diagnosis not present

## 2024-03-17 DIAGNOSIS — E039 Hypothyroidism, unspecified: Secondary | ICD-10-CM | POA: Insufficient documentation

## 2024-03-17 DIAGNOSIS — Z87440 Personal history of urinary (tract) infections: Secondary | ICD-10-CM | POA: Diagnosis not present

## 2024-03-17 DIAGNOSIS — I119 Hypertensive heart disease without heart failure: Secondary | ICD-10-CM | POA: Insufficient documentation

## 2024-03-17 DIAGNOSIS — K219 Gastro-esophageal reflux disease without esophagitis: Secondary | ICD-10-CM | POA: Insufficient documentation

## 2024-03-17 DIAGNOSIS — E785 Hyperlipidemia, unspecified: Secondary | ICD-10-CM | POA: Insufficient documentation

## 2024-03-17 DIAGNOSIS — Z809 Family history of malignant neoplasm, unspecified: Secondary | ICD-10-CM | POA: Diagnosis not present

## 2024-03-17 DIAGNOSIS — I700 Atherosclerosis of aorta: Secondary | ICD-10-CM | POA: Insufficient documentation

## 2024-03-17 DIAGNOSIS — Z7901 Long term (current) use of anticoagulants: Secondary | ICD-10-CM | POA: Insufficient documentation

## 2024-03-17 DIAGNOSIS — Z79899 Other long term (current) drug therapy: Secondary | ICD-10-CM | POA: Insufficient documentation

## 2024-03-17 DIAGNOSIS — Z191 Hormone sensitive malignancy status: Secondary | ICD-10-CM | POA: Diagnosis not present

## 2024-03-17 DIAGNOSIS — C61 Malignant neoplasm of prostate: Secondary | ICD-10-CM | POA: Insufficient documentation

## 2024-03-17 DIAGNOSIS — K449 Diaphragmatic hernia without obstruction or gangrene: Secondary | ICD-10-CM | POA: Diagnosis not present

## 2024-03-17 DIAGNOSIS — Z79890 Hormone replacement therapy (postmenopausal): Secondary | ICD-10-CM | POA: Insufficient documentation

## 2024-03-17 DIAGNOSIS — J45909 Unspecified asthma, uncomplicated: Secondary | ICD-10-CM | POA: Insufficient documentation

## 2024-03-17 HISTORY — DX: Elevated prostate specific antigen (PSA): R97.20

## 2024-03-17 NOTE — Progress Notes (Signed)
 Radiation Oncology         [phone] ________________________________  Initial Outpatient Consultation  Name: Casey [name] MRN: [id number]  Date: 03/17/2024  DOB: Apr 17, 1946  RR:Hnoipwh, Norleen, MD  Casey Senior, MD   REFERRING PHYSICIAN: Matilda Senior, MD  DIAGNOSIS: 78 y.o. gentleman with Stage T1c adenocarcinoma of the prostate with Gleason score of 4+3, and PSA of 11.4.    ICD-10-CM   1. Malignant neoplasm of prostate (HCC)  C61       HISTORY OF PRESENT ILLNESS: Casey [name] is a 78 y.o. male with a diagnosis of prostate cancer. He has a history of recurrent UTIs and meatal stenosis, previously followed with Dr. Watt in 2020. More recently, he was noted to have an elevated PSA of 6.5 on routine labs in 06/2023 by his primary care physician, Dr. Marvine.  Accordingly, he was referred back to urology for evaluation by Dr. Matilda on 07/22/23,  digital rectal examination performed at that time showed no induration or nodularity.  He was treated for an enterococcal UTI at that time and urinalysis was clear on follow up. PSA continued to rise at 8.8 in 09/2023 and 11.4 in 01/2024. Therefore, the patient proceeded to transrectal ultrasound with 12 biopsies of the prostate on 02/03/24.  The prostate volume measured 29 cc.  Out of 12 core biopsies, 3 were positive.  The maximum Gleason score was 4+3, and this was seen in the right base, involving 90% of that core. Additionally, Gleason 3+4 was seen in the right base lateral (80%) and right mid lateral (20%).     He had a CT A/P on 02/18/24 for disease staging that was without evidence of metastatic disease in the abdomen or pelvis. There were some aortic calcifications noted incidentally on image review. The patient reviewed the biopsy and imaging results with his urologist and he has kindly been referred today for discussion of potential radiation treatment options. He is accompanied by his wife, Casey [name] and his daughter, Casey [name], is  participating via speaker-phone.   PREVIOUS RADIATION THERAPY: No  PAST MEDICAL HISTORY:  Past Medical History:  Diagnosis Date   Arthritis    Asthma    Atrial fibrillation (HCC) 01/12/2010   2D Echo EF=>55%   Bipolar disorder (HCC)    Chronic back pain    Depression    Dysrhythmia    AFib   GERD (gastroesophageal reflux disease)    History of colonic polyps    History of gout    Hyperlipidemia    Hypertension    Hypothyroidism    Morbid obesity (HCC)    OSA (obstructive sleep apnea)    AHI was 27.62hr RDI was 34.3 hr REM 0.00hr; had CPAP years ago but no longer uses.   Prostatitis    Tubular adenoma of colon 01/2016      PAST SURGICAL HISTORY: Past Surgical History:  Procedure Laterality Date   APPENDECTOMY  1959   asthma     CATARACT EXTRACTION Bilateral 1995   COLONOSCOPY  2011   RMR: left-sided diverticula, minimal rectal friability. No polyps. Repeat 5 years.   COLONOSCOPY WITH PROPOFOL  N/A 01/15/2016   Dr.Rourk- diverticulosis in the entire examined colon, multiple rectal and colonic polyps, internal hemorrhoids. bx= tubular adenomas and hyperplastic polyps.    COLONOSCOPY WITH PROPOFOL  N/A 03/13/2022   Procedure: COLONOSCOPY WITH PROPOFOL ;  Surgeon: [name], MD;  [facility];  Service: Endoscopy;  Laterality: N/A;  11:00am   HAND / FINGER LESION EXCISION Right  IR THORACENTESIS ASP PLEURAL SPACE W/IMG GUIDE  03/01/2019   IR THORACENTESIS ASP PLEURAL SPACE W/IMG GUIDE  03/31/2019   POLYPECTOMY  01/15/2016   Procedure: POLYPECTOMY;  Surgeon: Lamar CHRISTELLA Hollingshead, MD;  Location: [facility];  Service: Endoscopy;;   POLYPECTOMY  03/13/2022   Procedure: POLYPECTOMY;  Surgeon: Hollingshead Lamar CHRISTELLA, MD;  Location: [facility];  Service: Endoscopy;;   right shoulder surgery     TONSILLECTOMY  1955    FAMILY HISTORY:  Family History  Problem Relation Age of Onset   Diabetes Mother    Heart failure Mother    Hypertension Mother    Hypertension Father     Cancer Paternal Grandmother    Stroke Sister    Liver disease Brother    Vascular Disease Brother    Arthritis Brother    Colon cancer Neg Hx     SOCIAL HISTORY:  Social History   Socioeconomic History   Marital status: Married    Spouse name: Not on file   Number of children: Not on file   Years of education: Not on file   Highest education level: Not on file  Occupational History   Not on file  Tobacco Use   Smoking status: Never   Smokeless tobacco: Never  Vaping Use   Vaping status: Never Used  Substance and Sexual Activity   Alcohol use: No    Alcohol/week: 0.0 standard drinks of alcohol   Drug use: No   Sexual activity: Yes    Birth control/protection: None  Other Topics Concern   Not on file  Social History Narrative   Not on file   Social Drivers of Health   Financial Resource Strain: Not on file  Food Insecurity: No Food Insecurity (11/24/2022)   Hunger Vital Sign    Worried About Running Out of Food in the Last Year: Never true    Ran Out of Food in the Last Year: Never true  Transportation Needs: No Transportation Needs (11/24/2022)   PRAPARE - Administrator, Civil Service (Medical): No    Lack of Transportation (Non-Medical): No  Physical Activity: Not on file  Stress: Not on file  Social Connections: Not on file  Intimate Partner Violence: Patient Declined (11/24/2022)   Humiliation, Afraid, Rape, and Kick questionnaire    Fear of Current or Ex-Partner: Patient declined    Emotionally Abused: Patient declined    Physically Abused: Patient declined    Sexually Abused: Patient declined    ALLERGIES: Amlodipine and Vicodin [hydrocodone -acetaminophen ]  MEDICATIONS:  Current Outpatient Medications  Medication Sig Dispense Refill   acetaminophen  (TYLENOL ) 500 MG tablet Take 1,000 mg by mouth every 6 (six) hours as needed for headache.     albuterol  (VENTOLIN  HFA) 108 (90 Base) MCG/ACT inhaler Inhale 2 puffs into the lungs every 6 (six)  hours as needed (Asthma).     Cholecalciferol (VITAMIN D3) 250 MCG (10000 UT) capsule Take 10,000 Units by mouth daily.     levothyroxine  (SYNTHROID , LEVOTHROID) 25 MCG tablet Take 25 mcg by mouth daily before breakfast.     lithium  carbonate 150 MG capsule Take 3 capsules (450 mg total) by mouth at bedtime. 270 capsule 0   metoprolol  succinate (TOPROL -XL) 25 MG 24 hr tablet Take 1 tablet by mouth once daily 90 tablet 3   pantoprazole  (PROTONIX ) 40 MG tablet TAKE ONE TABLET BY MOUTH ONCE DAILY (Patient taking differently: Take 40 mg by mouth daily.) 30 tablet 11   pramipexole  (MIRAPEX ) 1  MG tablet Take 1 tablet (1 mg total) by mouth 3 (three) times daily. 270 tablet 0   rivaroxaban  (XARELTO ) 20 MG TABS tablet Take 20 mg by mouth daily after supper.      rosuvastatin  (CRESTOR ) 20 MG tablet Take 1 tablet (20 mg total) by mouth daily. 30 tablet 0   sertraline  (ZOLOFT ) 50 MG tablet Take 1 tablet (50 mg total) by mouth daily. 90 tablet 0   SUPER B COMPLEX/C PO Take 1 tablet by mouth daily.     No current facility-administered medications for this encounter.    REVIEW OF SYSTEMS:  On review of systems, the patient reports that he is doing well overall. He denies any chest pain, shortness of breath, cough, fevers, chills, night sweats, unintended weight changes. He denies any bowel disturbances, and denies abdominal pain, nausea or vomiting. He denies any new musculoskeletal or joint aches or pains. His IPSS was 4, indicating minimal urinary symptoms. His SHIM was 7, indicating he has severe erectile dysfunction. A complete review of systems is obtained and is otherwise negative.    PHYSICAL EXAM:  Wt Readings from Last 3 Encounters:  03/07/24 200 lb (90.7 kg)  10/07/23 200 lb 12.8 oz (91.1 kg)  08/22/23 206 lb 12.8 oz (93.8 kg)   Temp Readings from Last 3 Encounters:  03/07/24 98.6 F (37 C) (Oral)  02/03/24 98 F (36.7 C) (Oral)  12/29/22 98.2 F (36.8 C)   BP Readings from Last 3  Encounters:  03/07/24 137/72  02/03/24 122/78  01/13/24 (!) 88/43   Pulse Readings from Last 3 Encounters:  03/07/24 82  02/03/24 64  01/13/24 89    /10  In general this is a well appearing Caucasian male in no acute distress. He's alert and oriented x4 and appropriate throughout the examination. Cardiopulmonary assessment is negative for acute distress, and he exhibits normal effort.     KPS = 100  100 - Normal; no complaints; no evidence of disease. 90   - Able to carry on normal activity; minor signs or symptoms of disease. 80   - Normal activity with effort; some signs or symptoms of disease. 22   - Cares for self; unable to carry on normal activity or to do active work. 60   - Requires occasional assistance, but is able to care for most of his personal needs. 50   - Requires considerable assistance and frequent medical care. 40   - Disabled; requires special care and assistance. 30   - Severely disabled; hospital admission is indicated although death not imminent. 20   - Very sick; hospital admission necessary; active supportive treatment necessary. 10   - Moribund; fatal processes progressing rapidly. 0     - Dead  Karnofsky DA, Abelmann WH, Craver LS and [facility] [phone]) The use of the nitrogen mustards in the palliative treatment of carcinoma: with particular reference to bronchogenic carcinoma Cancer 1 634-56  LABORATORY DATA:  Lab Results  Component Value Date   WBC 7.7 08/22/2023   HGB 13.6 08/22/2023   HCT 41.6 08/22/2023   MCV 98 (H) 08/22/2023   PLT 181 08/22/2023   Lab Results  Component Value Date   NA 142 02/25/2024   K 3.8 02/25/2024   CL 105 02/25/2024   CO2 22 02/25/2024   Lab Results  Component Value Date   ALT 13 11/24/2022   AST 18 11/24/2022   ALKPHOS 70 11/24/2022   BILITOT 0.8 11/24/2022     RADIOGRAPHY: CT ABDOMEN  PELVIS W CONTRAST Result Date: 02/19/2024 CLINICAL DATA:  Intermediate risk prostate carcinoma. Staging. * Tracking  Code: BO * EXAM: CT ABDOMEN AND PELVIS WITH CONTRAST TECHNIQUE: Multidetector CT imaging of the abdomen and pelvis was performed using the standard protocol following bolus administration of intravenous contrast. RADIATION DOSE REDUCTION: This exam was performed according to the departmental dose-optimization program which includes automated exposure control, adjustment of the mA and/or kV according to patient size and/or use of iterative reconstruction technique. CONTRAST:  80mL OMNIPAQUE  IOHEXOL  300 MG/ML  SOLN COMPARISON:  12/30/2018 FINDINGS: Lower Chest: No acute findings. Hepatobiliary: No suspicious hepatic masses identified. A noncalcified gallstone is seen, without signs of cholecystitis or biliary ductal dilatation. Pancreas:  No mass or inflammatory changes. Spleen: Within normal limits in size and appearance. Adrenals/Urinary Tract: No suspicious masses identified. Small benign-appearing renal cysts are again seen (No followup imaging is recommended). A few nonobstructing less than 5 mm renal calculi are again seen bilaterally. No evidence of ureteral calculi or hydronephrosis. Unremarkable unopacified urinary bladder. Stomach/Bowel: Small hiatal hernia. No evidence of obstruction, inflammatory process or abnormal fluid collections. Vascular/Lymphatic: No pathologically enlarged lymph nodes. No acute vascular findings. Reproductive: Unremarkable appearance of prostate. Symmetric seminal vesicles noted. Other:  None. Musculoskeletal: No suspicious bone lesions identified. Several old bilateral rib fracture deformities and mild left pleural thickening noted. IMPRESSION: No evidence of metastatic disease within the abdomen or pelvis. Cholelithiasis. No radiographic evidence of cholecystitis. Bilateral nephrolithiasis. No evidence of ureteral calculi or hydronephrosis. Small hiatal hernia. Electronically Signed   By: Casey [name] DELENA Kil M.D.   On: 02/19/2024 13:33      IMPRESSION/PLAN: 1. 78 y.o. gentleman with  Stage T1c adenocarcinoma of the prostate with Gleason Score of 4+3, and PSA of 11.4. We discussed the patient's workup and outlined the nature of prostate cancer in this setting. The patient's T stage, Gleason's score, and PSA put him into the unfavorable intermediate risk group. Accordingly, he is eligible for a variety of potential treatment options including brachytherapy, 5.5 weeks of external radiation +/- ST-ADT, or prostatectomy. We discussed the available radiation techniques, and focused on the details and logistics of delivery.  We discussed and outlined the risks, benefits, short and long-term effects associated with radiotherapy and compared and contrasted these with prostatectomy. We discussed the role of SpaceOAR gel in reducing the rectal toxicity associated with radiotherapy. We also detailed the role of ADT in the treatment of unfavorable intermediate risk prostate cancer and outlined the associated side effects that could be expected with this therapy.  Given his low volume Gleason 4+3 disease and history of cardiac disease and CVA, we feel the risks of adding ADT to his treatment will outweigh any small potential benefit.  He and his family were encouraged to ask questions that were answered to their stated satisfaction.  At the conclusion of our conversation, the patient is interested in moving forward with 5.5 weeks of external beam therapy without concurrent ADT and is leaning towards having the treatments here in [place] as opposed to  which would be a little closer to his home in [place]. He would like to avoid having any procedures that require holding his Eliquis  so we will forego fiducial markers and SpaceOAR gel placement and will share our discussion with Dr. Matilda and make arrangements for CT simulation/treatment planning, first available. The patient appears to have a good understanding of his disease and our treatment recommendations which are of curative intent and  is in agreement with the stated plan.  Therefore, we will move forward with treatment planning accordingly, in anticipation of beginning IMRT in the near future. We enjoyed meeting him and his family and look forward to continuing to participate in his care. .  We personally spent 70 minutes in this encounter including chart review, reviewing radiological studies, meeting face-to-face with the patient, entering orders and completing documentation.    Sabra MICAEL Rusk, PA-C    Donnice Barge, MD  [facility]  Radiation Oncology Direct Dial: [phone]  Fax: [phone] [email or web address]  Skype  LinkedIn

## 2024-03-19 NOTE — Progress Notes (Signed)
 RN spoke with patient's wife, Rock.   RN explained next steps for CT Simulation, pending appointment date.  All questions answered. Will continue to follow.

## 2024-03-22 ENCOUNTER — Telehealth: Payer: Self-pay | Admitting: *Deleted

## 2024-03-22 NOTE — Telephone Encounter (Signed)
 CALLED PATIENT TO INFORM OF SIM APPT.; FOR 03-31-24- ARRIVAL TIME- 2:15 PM, SPOKE WITH PATIENT'S WIFE LINDA AND SHE IS AWARE OF THIS APPT.

## 2024-03-25 ENCOUNTER — Ambulatory Visit: Admitting: Radiation Oncology

## 2024-03-26 ENCOUNTER — Ambulatory Visit: Admitting: Radiation Oncology

## 2024-03-30 ENCOUNTER — Telehealth: Payer: Self-pay | Admitting: *Deleted

## 2024-03-30 NOTE — Telephone Encounter (Signed)
 CALLED PATIENT TO REMIND OF SIM APPT. FOR 03-31-24- ARRIVAL TIME- 2:15 PM @ [facility], SPOKE WITH PATIENT'S WIFE- LINDA AND SHE IS AWARE OF THIS APPT. AND THE INSTRUCTIONS

## 2024-03-31 ENCOUNTER — Ambulatory Visit
Admission: RE | Admit: 2024-03-31 | Discharge: 2024-03-31 | Disposition: A | Discharge status: Home / Self Care | Source: Ambulatory Visit | Attending: Radiation Oncology | Admitting: Radiation Oncology

## 2024-03-31 DIAGNOSIS — C61 Malignant neoplasm of prostate: Secondary | ICD-10-CM | POA: Insufficient documentation

## 2024-03-31 DIAGNOSIS — Z510 Encounter for antineoplastic radiation therapy: Secondary | ICD-10-CM | POA: Diagnosis not present

## 2024-03-31 DIAGNOSIS — Z191 Hormone sensitive malignancy status: Secondary | ICD-10-CM | POA: Diagnosis not present

## 2024-03-31 NOTE — Progress Notes (Signed)
  Radiation Oncology         [phone] ________________________________  Name: Casey [name] MRN: [id number]  Date: 03/31/2024  DOB: Jun 26, 1946  SIMULATION AND TREATMENT PLANNING NOTE    ICD-10-CM   1. Malignant neoplasm of prostate (HCC)  C61       DIAGNOSIS:  78 y.o. gentleman with Stage T1c adenocarcinoma of the prostate with Gleason Score of 4+3, and PSA of 11.4.   NARRATIVE:  The patient was brought to the CT Simulation planning suite.  Identity was confirmed.  All relevant records and images related to the planned course of therapy were reviewed.  The patient freely provided informed written consent to proceed with treatment after reviewing the details related to the planned course of therapy. The consent form was witnessed and verified by the simulation staff.  Then, the patient was set-up in a stable reproducible supine position for radiation therapy.  A vacuum lock pillow device was custom fabricated to position his legs in a reproducible immobilized position.  Then, I performed a urethrogram under sterile conditions to identify the prostatic apex.  CT images were obtained.  Surface markings were placed.  The CT images were loaded into the planning software.  Then the prostate target and avoidance structures including the rectum, bladder, bowel and hips were contoured.  Treatment planning then occurred.  The radiation prescription was entered and confirmed.  A total of one complex treatment devices was fabricated. I have requested : Intensity Modulated Radiotherapy (IMRT) is medically necessary for this case for the following reason:  Rectal sparing.SABRA  PLAN:  The patient will receive 70 Gy in 28 fractions.  ________________________________  Donnice FELIX Patrcia, M.D.

## 2024-04-01 DIAGNOSIS — Z510 Encounter for antineoplastic radiation therapy: Secondary | ICD-10-CM | POA: Diagnosis not present

## 2024-04-01 DIAGNOSIS — I739 Peripheral vascular disease, unspecified: Secondary | ICD-10-CM | POA: Diagnosis not present

## 2024-04-01 DIAGNOSIS — B351 Tinea unguium: Secondary | ICD-10-CM | POA: Diagnosis not present

## 2024-04-01 DIAGNOSIS — C61 Malignant neoplasm of prostate: Secondary | ICD-10-CM | POA: Diagnosis not present

## 2024-04-01 DIAGNOSIS — Z191 Hormone sensitive malignancy status: Secondary | ICD-10-CM | POA: Diagnosis not present

## 2024-04-01 DIAGNOSIS — Z7901 Long term (current) use of anticoagulants: Secondary | ICD-10-CM | POA: Diagnosis not present

## 2024-04-12 ENCOUNTER — Other Ambulatory Visit: Payer: Self-pay

## 2024-04-12 ENCOUNTER — Ambulatory Visit
Admission: RE | Admit: 2024-04-12 | Discharge: 2024-04-12 | Disposition: A | Discharge status: Home / Self Care | Source: Ambulatory Visit | Attending: Radiation Oncology | Admitting: Radiation Oncology

## 2024-04-12 DIAGNOSIS — Z191 Hormone sensitive malignancy status: Secondary | ICD-10-CM | POA: Diagnosis not present

## 2024-04-12 DIAGNOSIS — Z510 Encounter for antineoplastic radiation therapy: Secondary | ICD-10-CM | POA: Diagnosis not present

## 2024-04-12 DIAGNOSIS — C61 Malignant neoplasm of prostate: Secondary | ICD-10-CM | POA: Diagnosis not present

## 2024-04-12 LAB — RAD ONC ARIA SESSION SUMMARY
Course Elapsed Days: 0
Plan Fractions Treated to Date: 1
Plan Prescribed Dose Per Fraction: 2.5 Gy
Plan Total Fractions Prescribed: 28
Plan Total Prescribed Dose: 70 Gy
Reference Point Dosage Given to Date: 2.5 Gy
Reference Point Session Dosage Given: 2.5 Gy
Session Number: 1

## 2024-04-13 ENCOUNTER — Ambulatory Visit

## 2024-04-14 ENCOUNTER — Other Ambulatory Visit: Payer: Self-pay

## 2024-04-14 ENCOUNTER — Ambulatory Visit
Admission: RE | Admit: 2024-04-14 | Discharge: 2024-04-14 | Disposition: A | Discharge status: Home / Self Care | Source: Ambulatory Visit | Attending: Radiation Oncology | Admitting: Radiation Oncology

## 2024-04-14 DIAGNOSIS — Z510 Encounter for antineoplastic radiation therapy: Secondary | ICD-10-CM | POA: Insufficient documentation

## 2024-04-14 DIAGNOSIS — C61 Malignant neoplasm of prostate: Secondary | ICD-10-CM | POA: Insufficient documentation

## 2024-04-14 DIAGNOSIS — Z191 Hormone sensitive malignancy status: Secondary | ICD-10-CM | POA: Diagnosis not present

## 2024-04-14 LAB — RAD ONC ARIA SESSION SUMMARY
Course Elapsed Days: 2
Plan Fractions Treated to Date: 2
Plan Prescribed Dose Per Fraction: 2.5 Gy
Plan Total Fractions Prescribed: 28
Plan Total Prescribed Dose: 70 Gy
Reference Point Dosage Given to Date: 5 Gy
Reference Point Session Dosage Given: 2.5 Gy
Session Number: 2

## 2024-04-15 ENCOUNTER — Other Ambulatory Visit: Payer: Self-pay

## 2024-04-15 ENCOUNTER — Ambulatory Visit
Admission: RE | Admit: 2024-04-15 | Discharge: 2024-04-15 | Disposition: A | Discharge status: Home / Self Care | Source: Ambulatory Visit | Attending: Radiation Oncology | Admitting: Radiation Oncology

## 2024-04-15 DIAGNOSIS — C61 Malignant neoplasm of prostate: Secondary | ICD-10-CM | POA: Diagnosis not present

## 2024-04-15 DIAGNOSIS — Z510 Encounter for antineoplastic radiation therapy: Secondary | ICD-10-CM | POA: Diagnosis not present

## 2024-04-15 DIAGNOSIS — Z85828 Personal history of other malignant neoplasm of skin: Secondary | ICD-10-CM | POA: Diagnosis not present

## 2024-04-15 DIAGNOSIS — Z191 Hormone sensitive malignancy status: Secondary | ICD-10-CM | POA: Diagnosis not present

## 2024-04-15 DIAGNOSIS — Z08 Encounter for follow-up examination after completed treatment for malignant neoplasm: Secondary | ICD-10-CM | POA: Diagnosis not present

## 2024-04-15 LAB — RAD ONC ARIA SESSION SUMMARY
Course Elapsed Days: 3
Plan Fractions Treated to Date: 3
Plan Prescribed Dose Per Fraction: 2.5 Gy
Plan Total Fractions Prescribed: 28
Plan Total Prescribed Dose: 70 Gy
Reference Point Dosage Given to Date: 7.5 Gy
Reference Point Session Dosage Given: 2.5 Gy
Session Number: 3

## 2024-04-16 ENCOUNTER — Other Ambulatory Visit: Payer: Self-pay

## 2024-04-16 ENCOUNTER — Ambulatory Visit
Admission: RE | Admit: 2024-04-16 | Discharge: 2024-04-16 | Disposition: A | Discharge status: Home / Self Care | Source: Ambulatory Visit | Attending: Radiation Oncology | Admitting: Radiation Oncology

## 2024-04-16 DIAGNOSIS — C61 Malignant neoplasm of prostate: Secondary | ICD-10-CM | POA: Diagnosis not present

## 2024-04-16 DIAGNOSIS — Z191 Hormone sensitive malignancy status: Secondary | ICD-10-CM | POA: Diagnosis not present

## 2024-04-16 DIAGNOSIS — Z510 Encounter for antineoplastic radiation therapy: Secondary | ICD-10-CM | POA: Diagnosis not present

## 2024-04-16 LAB — RAD ONC ARIA SESSION SUMMARY
Course Elapsed Days: 4
Plan Fractions Treated to Date: 4
Plan Prescribed Dose Per Fraction: 2.5 Gy
Plan Total Fractions Prescribed: 28
Plan Total Prescribed Dose: 70 Gy
Reference Point Dosage Given to Date: 10 Gy
Reference Point Session Dosage Given: 2.5 Gy
Session Number: 4

## 2024-04-19 ENCOUNTER — Ambulatory Visit
Admission: RE | Admit: 2024-04-19 | Discharge: 2024-04-19 | Disposition: A | Discharge status: Home / Self Care | Source: Ambulatory Visit | Attending: Radiation Oncology | Admitting: Radiation Oncology

## 2024-04-19 ENCOUNTER — Other Ambulatory Visit: Payer: Self-pay

## 2024-04-19 DIAGNOSIS — Z191 Hormone sensitive malignancy status: Secondary | ICD-10-CM | POA: Diagnosis not present

## 2024-04-19 DIAGNOSIS — Z510 Encounter for antineoplastic radiation therapy: Secondary | ICD-10-CM | POA: Diagnosis not present

## 2024-04-19 DIAGNOSIS — C61 Malignant neoplasm of prostate: Secondary | ICD-10-CM | POA: Diagnosis not present

## 2024-04-19 LAB — RAD ONC ARIA SESSION SUMMARY
Course Elapsed Days: 7
Plan Fractions Treated to Date: 5
Plan Prescribed Dose Per Fraction: 2.5 Gy
Plan Total Fractions Prescribed: 28
Plan Total Prescribed Dose: 70 Gy
Reference Point Dosage Given to Date: 12.5 Gy
Reference Point Session Dosage Given: 2.5 Gy
Session Number: 5

## 2024-04-20 ENCOUNTER — Other Ambulatory Visit: Payer: Self-pay

## 2024-04-20 ENCOUNTER — Ambulatory Visit
Admission: RE | Admit: 2024-04-20 | Discharge: 2024-04-20 | Disposition: A | Discharge status: Home / Self Care | Source: Ambulatory Visit | Attending: Radiation Oncology | Admitting: Radiation Oncology

## 2024-04-20 DIAGNOSIS — Z191 Hormone sensitive malignancy status: Secondary | ICD-10-CM | POA: Diagnosis not present

## 2024-04-20 DIAGNOSIS — Z510 Encounter for antineoplastic radiation therapy: Secondary | ICD-10-CM | POA: Diagnosis not present

## 2024-04-20 DIAGNOSIS — C61 Malignant neoplasm of prostate: Secondary | ICD-10-CM | POA: Diagnosis not present

## 2024-04-20 LAB — RAD ONC ARIA SESSION SUMMARY
Course Elapsed Days: 8
Plan Fractions Treated to Date: 6
Plan Prescribed Dose Per Fraction: 2.5 Gy
Plan Total Fractions Prescribed: 28
Plan Total Prescribed Dose: 70 Gy
Reference Point Dosage Given to Date: 15 Gy
Reference Point Session Dosage Given: 2.5 Gy
Session Number: 6

## 2024-04-21 ENCOUNTER — Ambulatory Visit
Admission: RE | Admit: 2024-04-21 | Discharge: 2024-04-21 | Disposition: A | Source: Ambulatory Visit | Attending: Radiation Oncology

## 2024-04-21 ENCOUNTER — Other Ambulatory Visit: Payer: Self-pay

## 2024-04-21 DIAGNOSIS — C61 Malignant neoplasm of prostate: Secondary | ICD-10-CM | POA: Diagnosis not present

## 2024-04-21 DIAGNOSIS — Z191 Hormone sensitive malignancy status: Secondary | ICD-10-CM | POA: Diagnosis not present

## 2024-04-21 DIAGNOSIS — Z510 Encounter for antineoplastic radiation therapy: Secondary | ICD-10-CM | POA: Diagnosis not present

## 2024-04-21 LAB — RAD ONC ARIA SESSION SUMMARY
Course Elapsed Days: 9
Plan Fractions Treated to Date: 7
Plan Prescribed Dose Per Fraction: 2.5 Gy
Plan Total Fractions Prescribed: 28
Plan Total Prescribed Dose: 70 Gy
Reference Point Dosage Given to Date: 17.5 Gy
Reference Point Session Dosage Given: 2.5 Gy
Session Number: 7

## 2024-04-22 ENCOUNTER — Other Ambulatory Visit: Payer: Self-pay

## 2024-04-22 ENCOUNTER — Ambulatory Visit
Admission: RE | Admit: 2024-04-22 | Discharge: 2024-04-22 | Disposition: A | Discharge status: Home / Self Care | Source: Ambulatory Visit | Attending: Radiation Oncology | Admitting: Radiation Oncology

## 2024-04-22 DIAGNOSIS — Z510 Encounter for antineoplastic radiation therapy: Secondary | ICD-10-CM | POA: Diagnosis not present

## 2024-04-22 DIAGNOSIS — Z191 Hormone sensitive malignancy status: Secondary | ICD-10-CM | POA: Diagnosis not present

## 2024-04-22 DIAGNOSIS — C61 Malignant neoplasm of prostate: Secondary | ICD-10-CM | POA: Diagnosis not present

## 2024-04-22 LAB — RAD ONC ARIA SESSION SUMMARY
Course Elapsed Days: 10
Plan Fractions Treated to Date: 8
Plan Prescribed Dose Per Fraction: 2.5 Gy
Plan Total Fractions Prescribed: 28
Plan Total Prescribed Dose: 70 Gy
Reference Point Dosage Given to Date: 20 Gy
Reference Point Session Dosage Given: 2.5 Gy
Session Number: 8

## 2024-04-23 ENCOUNTER — Other Ambulatory Visit: Payer: Self-pay

## 2024-04-23 ENCOUNTER — Ambulatory Visit
Admission: RE | Admit: 2024-04-23 | Discharge: 2024-04-23 | Disposition: A | Discharge status: Home / Self Care | Source: Ambulatory Visit | Attending: Radiation Oncology | Admitting: Radiation Oncology

## 2024-04-23 DIAGNOSIS — Z510 Encounter for antineoplastic radiation therapy: Secondary | ICD-10-CM | POA: Diagnosis not present

## 2024-04-23 DIAGNOSIS — Z191 Hormone sensitive malignancy status: Secondary | ICD-10-CM | POA: Diagnosis not present

## 2024-04-23 DIAGNOSIS — C61 Malignant neoplasm of prostate: Secondary | ICD-10-CM | POA: Diagnosis not present

## 2024-04-23 LAB — RAD ONC ARIA SESSION SUMMARY
Course Elapsed Days: 11
Plan Fractions Treated to Date: 9
Plan Prescribed Dose Per Fraction: 2.5 Gy
Plan Total Fractions Prescribed: 28
Plan Total Prescribed Dose: 70 Gy
Reference Point Dosage Given to Date: 22.5 Gy
Reference Point Session Dosage Given: 2.5 Gy
Session Number: 9

## 2024-04-26 ENCOUNTER — Ambulatory Visit
Admission: RE | Admit: 2024-04-26 | Discharge: 2024-04-26 | Disposition: A | Source: Ambulatory Visit | Attending: Radiation Oncology

## 2024-04-26 ENCOUNTER — Other Ambulatory Visit: Payer: Self-pay

## 2024-04-26 DIAGNOSIS — Z510 Encounter for antineoplastic radiation therapy: Secondary | ICD-10-CM | POA: Diagnosis not present

## 2024-04-26 DIAGNOSIS — Z191 Hormone sensitive malignancy status: Secondary | ICD-10-CM | POA: Diagnosis not present

## 2024-04-26 DIAGNOSIS — C61 Malignant neoplasm of prostate: Secondary | ICD-10-CM | POA: Diagnosis not present

## 2024-04-26 LAB — RAD ONC ARIA SESSION SUMMARY
Course Elapsed Days: 14
Plan Fractions Treated to Date: 10
Plan Prescribed Dose Per Fraction: 2.5 Gy
Plan Total Fractions Prescribed: 28
Plan Total Prescribed Dose: 70 Gy
Reference Point Dosage Given to Date: 25 Gy
Reference Point Session Dosage Given: 2.5 Gy
Session Number: 10

## 2024-04-27 ENCOUNTER — Ambulatory Visit: Admitting: Psychiatry

## 2024-04-27 ENCOUNTER — Other Ambulatory Visit: Payer: Self-pay

## 2024-04-27 ENCOUNTER — Ambulatory Visit
Admission: RE | Admit: 2024-04-27 | Discharge: 2024-04-27 | Disposition: A | Discharge status: Home / Self Care | Source: Ambulatory Visit | Attending: Radiation Oncology | Admitting: Radiation Oncology

## 2024-04-27 DIAGNOSIS — Z191 Hormone sensitive malignancy status: Secondary | ICD-10-CM | POA: Diagnosis not present

## 2024-04-27 DIAGNOSIS — C61 Malignant neoplasm of prostate: Secondary | ICD-10-CM | POA: Diagnosis not present

## 2024-04-27 DIAGNOSIS — Z510 Encounter for antineoplastic radiation therapy: Secondary | ICD-10-CM | POA: Diagnosis not present

## 2024-04-27 LAB — RAD ONC ARIA SESSION SUMMARY
Course Elapsed Days: 15
Plan Fractions Treated to Date: 11
Plan Prescribed Dose Per Fraction: 2.5 Gy
Plan Total Fractions Prescribed: 28
Plan Total Prescribed Dose: 70 Gy
Reference Point Dosage Given to Date: 27.5 Gy
Reference Point Session Dosage Given: 2.5 Gy
Session Number: 11

## 2024-04-28 ENCOUNTER — Other Ambulatory Visit: Payer: Self-pay

## 2024-04-28 ENCOUNTER — Ambulatory Visit: Admission: RE | Admit: 2024-04-28 | Discharge: 2024-04-28 | Attending: Radiation Oncology

## 2024-04-28 DIAGNOSIS — Z510 Encounter for antineoplastic radiation therapy: Secondary | ICD-10-CM | POA: Diagnosis not present

## 2024-04-28 DIAGNOSIS — C61 Malignant neoplasm of prostate: Secondary | ICD-10-CM | POA: Diagnosis not present

## 2024-04-28 DIAGNOSIS — Z191 Hormone sensitive malignancy status: Secondary | ICD-10-CM | POA: Diagnosis not present

## 2024-04-28 LAB — RAD ONC ARIA SESSION SUMMARY
Course Elapsed Days: 16
Plan Fractions Treated to Date: 12
Plan Prescribed Dose Per Fraction: 2.5 Gy
Plan Total Fractions Prescribed: 28
Plan Total Prescribed Dose: 70 Gy
Reference Point Dosage Given to Date: 30 Gy
Reference Point Session Dosage Given: 2.5 Gy
Session Number: 12

## 2024-04-29 ENCOUNTER — Other Ambulatory Visit: Payer: Self-pay

## 2024-04-29 ENCOUNTER — Ambulatory Visit
Admission: RE | Admit: 2024-04-29 | Discharge: 2024-04-29 | Disposition: A | Discharge status: Home / Self Care | Source: Ambulatory Visit | Attending: Radiation Oncology | Admitting: Radiation Oncology

## 2024-04-29 DIAGNOSIS — Z191 Hormone sensitive malignancy status: Secondary | ICD-10-CM | POA: Diagnosis not present

## 2024-04-29 DIAGNOSIS — Z510 Encounter for antineoplastic radiation therapy: Secondary | ICD-10-CM | POA: Diagnosis not present

## 2024-04-29 DIAGNOSIS — C61 Malignant neoplasm of prostate: Secondary | ICD-10-CM | POA: Diagnosis not present

## 2024-04-29 LAB — RAD ONC ARIA SESSION SUMMARY
Course Elapsed Days: 17
Plan Fractions Treated to Date: 13
Plan Prescribed Dose Per Fraction: 2.5 Gy
Plan Total Fractions Prescribed: 28
Plan Total Prescribed Dose: 70 Gy
Reference Point Dosage Given to Date: 32.5 Gy
Reference Point Session Dosage Given: 2.5 Gy
Session Number: 13

## 2024-04-30 ENCOUNTER — Ambulatory Visit
Admission: RE | Admit: 2024-04-30 | Discharge: 2024-04-30 | Disposition: A | Source: Ambulatory Visit | Attending: Radiation Oncology

## 2024-04-30 ENCOUNTER — Other Ambulatory Visit: Payer: Self-pay

## 2024-04-30 ENCOUNTER — Ambulatory Visit
Admission: RE | Admit: 2024-04-30 | Discharge: 2024-04-30 | Disposition: A | Discharge status: Home / Self Care | Source: Ambulatory Visit | Attending: Radiation Oncology | Admitting: Radiation Oncology

## 2024-04-30 DIAGNOSIS — Z191 Hormone sensitive malignancy status: Secondary | ICD-10-CM | POA: Diagnosis not present

## 2024-04-30 DIAGNOSIS — C61 Malignant neoplasm of prostate: Secondary | ICD-10-CM | POA: Diagnosis not present

## 2024-04-30 DIAGNOSIS — Z510 Encounter for antineoplastic radiation therapy: Secondary | ICD-10-CM | POA: Diagnosis not present

## 2024-04-30 LAB — RAD ONC ARIA SESSION SUMMARY
Course Elapsed Days: 18
Plan Fractions Treated to Date: 14
Plan Prescribed Dose Per Fraction: 2.5 Gy
Plan Total Fractions Prescribed: 28
Plan Total Prescribed Dose: 70 Gy
Reference Point Dosage Given to Date: 35 Gy
Reference Point Session Dosage Given: 2.5 Gy
Session Number: 14

## 2024-05-03 ENCOUNTER — Ambulatory Visit
Admission: RE | Admit: 2024-05-03 | Discharge: 2024-05-03 | Disposition: A | Source: Ambulatory Visit | Attending: Radiation Oncology

## 2024-05-03 ENCOUNTER — Other Ambulatory Visit: Payer: Self-pay

## 2024-05-03 DIAGNOSIS — C61 Malignant neoplasm of prostate: Secondary | ICD-10-CM | POA: Diagnosis not present

## 2024-05-03 DIAGNOSIS — Z510 Encounter for antineoplastic radiation therapy: Secondary | ICD-10-CM | POA: Diagnosis not present

## 2024-05-03 DIAGNOSIS — Z191 Hormone sensitive malignancy status: Secondary | ICD-10-CM | POA: Diagnosis not present

## 2024-05-03 LAB — RAD ONC ARIA SESSION SUMMARY
Course Elapsed Days: 21
Plan Fractions Treated to Date: 15
Plan Prescribed Dose Per Fraction: 2.5 Gy
Plan Total Fractions Prescribed: 28
Plan Total Prescribed Dose: 70 Gy
Reference Point Dosage Given to Date: 37.5 Gy
Reference Point Session Dosage Given: 2.5 Gy
Session Number: 15

## 2024-05-04 ENCOUNTER — Other Ambulatory Visit: Payer: Self-pay

## 2024-05-04 ENCOUNTER — Ambulatory Visit
Admission: RE | Admit: 2024-05-04 | Discharge: 2024-05-04 | Disposition: A | Discharge status: Home / Self Care | Source: Ambulatory Visit | Attending: Radiation Oncology | Admitting: Radiation Oncology

## 2024-05-04 DIAGNOSIS — C61 Malignant neoplasm of prostate: Secondary | ICD-10-CM | POA: Diagnosis not present

## 2024-05-04 DIAGNOSIS — Z191 Hormone sensitive malignancy status: Secondary | ICD-10-CM | POA: Diagnosis not present

## 2024-05-04 DIAGNOSIS — Z510 Encounter for antineoplastic radiation therapy: Secondary | ICD-10-CM | POA: Diagnosis not present

## 2024-05-04 LAB — RAD ONC ARIA SESSION SUMMARY
Course Elapsed Days: 22
Plan Fractions Treated to Date: 16
Plan Prescribed Dose Per Fraction: 2.5 Gy
Plan Total Fractions Prescribed: 28
Plan Total Prescribed Dose: 70 Gy
Reference Point Dosage Given to Date: 40 Gy
Reference Point Session Dosage Given: 2.5 Gy
Session Number: 16

## 2024-05-05 ENCOUNTER — Other Ambulatory Visit: Payer: Self-pay

## 2024-05-05 ENCOUNTER — Ambulatory Visit
Admission: RE | Admit: 2024-05-05 | Discharge: 2024-05-05 | Disposition: A | Discharge status: Home / Self Care | Source: Ambulatory Visit | Attending: Radiation Oncology | Admitting: Radiation Oncology

## 2024-05-05 DIAGNOSIS — Z510 Encounter for antineoplastic radiation therapy: Secondary | ICD-10-CM | POA: Diagnosis not present

## 2024-05-05 DIAGNOSIS — C61 Malignant neoplasm of prostate: Secondary | ICD-10-CM | POA: Diagnosis not present

## 2024-05-05 DIAGNOSIS — Z191 Hormone sensitive malignancy status: Secondary | ICD-10-CM | POA: Diagnosis not present

## 2024-05-05 LAB — RAD ONC ARIA SESSION SUMMARY
Course Elapsed Days: 23
Plan Fractions Treated to Date: 17
Plan Prescribed Dose Per Fraction: 2.5 Gy
Plan Total Fractions Prescribed: 28
Plan Total Prescribed Dose: 70 Gy
Reference Point Dosage Given to Date: 42.5 Gy
Reference Point Session Dosage Given: 2.5 Gy
Session Number: 17

## 2024-05-06 ENCOUNTER — Other Ambulatory Visit: Payer: Self-pay

## 2024-05-06 ENCOUNTER — Ambulatory Visit

## 2024-05-06 ENCOUNTER — Ambulatory Visit
Admission: RE | Admit: 2024-05-06 | Discharge: 2024-05-06 | Disposition: A | Discharge status: Home / Self Care | Source: Ambulatory Visit | Attending: Radiation Oncology | Admitting: Radiation Oncology

## 2024-05-06 DIAGNOSIS — Z510 Encounter for antineoplastic radiation therapy: Secondary | ICD-10-CM | POA: Diagnosis not present

## 2024-05-06 DIAGNOSIS — C61 Malignant neoplasm of prostate: Secondary | ICD-10-CM | POA: Diagnosis not present

## 2024-05-06 DIAGNOSIS — Z191 Hormone sensitive malignancy status: Secondary | ICD-10-CM | POA: Diagnosis not present

## 2024-05-06 LAB — RAD ONC ARIA SESSION SUMMARY
Course Elapsed Days: 24
Plan Fractions Treated to Date: 18
Plan Prescribed Dose Per Fraction: 2.5 Gy
Plan Total Fractions Prescribed: 28
Plan Total Prescribed Dose: 70 Gy
Reference Point Dosage Given to Date: 45 Gy
Reference Point Session Dosage Given: 2.5 Gy
Session Number: 18

## 2024-05-07 ENCOUNTER — Other Ambulatory Visit: Payer: Self-pay

## 2024-05-07 ENCOUNTER — Ambulatory Visit
Admission: RE | Admit: 2024-05-07 | Discharge: 2024-05-07 | Disposition: A | Discharge status: Home / Self Care | Source: Ambulatory Visit | Attending: Radiation Oncology | Admitting: Radiation Oncology

## 2024-05-07 DIAGNOSIS — C61 Malignant neoplasm of prostate: Secondary | ICD-10-CM | POA: Diagnosis not present

## 2024-05-07 DIAGNOSIS — Z191 Hormone sensitive malignancy status: Secondary | ICD-10-CM | POA: Diagnosis not present

## 2024-05-07 DIAGNOSIS — Z510 Encounter for antineoplastic radiation therapy: Secondary | ICD-10-CM | POA: Diagnosis not present

## 2024-05-07 LAB — RAD ONC ARIA SESSION SUMMARY
Course Elapsed Days: 25
Plan Fractions Treated to Date: 19
Plan Prescribed Dose Per Fraction: 2.5 Gy
Plan Total Fractions Prescribed: 28
Plan Total Prescribed Dose: 70 Gy
Reference Point Dosage Given to Date: 47.5 Gy
Reference Point Session Dosage Given: 2.5 Gy
Session Number: 19

## 2024-05-10 ENCOUNTER — Other Ambulatory Visit: Payer: Self-pay

## 2024-05-10 ENCOUNTER — Ambulatory Visit
Admission: RE | Admit: 2024-05-10 | Discharge: 2024-05-10 | Disposition: A | Discharge status: Home / Self Care | Source: Ambulatory Visit | Attending: Radiation Oncology | Admitting: Radiation Oncology

## 2024-05-10 DIAGNOSIS — Z191 Hormone sensitive malignancy status: Secondary | ICD-10-CM | POA: Diagnosis not present

## 2024-05-10 DIAGNOSIS — C61 Malignant neoplasm of prostate: Secondary | ICD-10-CM | POA: Diagnosis not present

## 2024-05-10 DIAGNOSIS — Z510 Encounter for antineoplastic radiation therapy: Secondary | ICD-10-CM | POA: Diagnosis not present

## 2024-05-10 LAB — RAD ONC ARIA SESSION SUMMARY
Course Elapsed Days: 28
Plan Fractions Treated to Date: 20
Plan Prescribed Dose Per Fraction: 2.5 Gy
Plan Total Fractions Prescribed: 28
Plan Total Prescribed Dose: 70 Gy
Reference Point Dosage Given to Date: 50 Gy
Reference Point Session Dosage Given: 2.5 Gy
Session Number: 20

## 2024-05-11 ENCOUNTER — Ambulatory Visit
Admission: RE | Admit: 2024-05-11 | Discharge: 2024-05-11 | Disposition: A | Source: Ambulatory Visit | Attending: Radiation Oncology

## 2024-05-11 ENCOUNTER — Other Ambulatory Visit: Payer: Self-pay

## 2024-05-11 DIAGNOSIS — Z191 Hormone sensitive malignancy status: Secondary | ICD-10-CM | POA: Diagnosis not present

## 2024-05-11 DIAGNOSIS — C61 Malignant neoplasm of prostate: Secondary | ICD-10-CM | POA: Diagnosis not present

## 2024-05-11 DIAGNOSIS — Z510 Encounter for antineoplastic radiation therapy: Secondary | ICD-10-CM | POA: Diagnosis not present

## 2024-05-11 LAB — RAD ONC ARIA SESSION SUMMARY
Course Elapsed Days: 29
Plan Fractions Treated to Date: 21
Plan Prescribed Dose Per Fraction: 2.5 Gy
Plan Total Fractions Prescribed: 28
Plan Total Prescribed Dose: 70 Gy
Reference Point Dosage Given to Date: 52.5 Gy
Reference Point Session Dosage Given: 2.5 Gy
Session Number: 21

## 2024-05-12 ENCOUNTER — Ambulatory Visit
Admission: RE | Admit: 2024-05-12 | Discharge: 2024-05-12 | Disposition: A | Source: Ambulatory Visit | Attending: Radiation Oncology

## 2024-05-12 ENCOUNTER — Other Ambulatory Visit: Payer: Self-pay

## 2024-05-12 DIAGNOSIS — Z191 Hormone sensitive malignancy status: Secondary | ICD-10-CM | POA: Diagnosis not present

## 2024-05-12 DIAGNOSIS — Z510 Encounter for antineoplastic radiation therapy: Secondary | ICD-10-CM | POA: Diagnosis not present

## 2024-05-12 DIAGNOSIS — C61 Malignant neoplasm of prostate: Secondary | ICD-10-CM | POA: Diagnosis not present

## 2024-05-12 LAB — RAD ONC ARIA SESSION SUMMARY
Course Elapsed Days: 30
Plan Fractions Treated to Date: 22
Plan Prescribed Dose Per Fraction: 2.5 Gy
Plan Total Fractions Prescribed: 28
Plan Total Prescribed Dose: 70 Gy
Reference Point Dosage Given to Date: 55 Gy
Reference Point Session Dosage Given: 2.5 Gy
Session Number: 22

## 2024-05-13 ENCOUNTER — Ambulatory Visit
Admission: RE | Admit: 2024-05-13 | Discharge: 2024-05-13 | Disposition: A | Discharge status: Home / Self Care | Source: Ambulatory Visit | Attending: Radiation Oncology | Admitting: Radiation Oncology

## 2024-05-13 ENCOUNTER — Other Ambulatory Visit: Payer: Self-pay

## 2024-05-13 DIAGNOSIS — Z510 Encounter for antineoplastic radiation therapy: Secondary | ICD-10-CM | POA: Diagnosis not present

## 2024-05-13 DIAGNOSIS — Z191 Hormone sensitive malignancy status: Secondary | ICD-10-CM | POA: Diagnosis not present

## 2024-05-13 DIAGNOSIS — C61 Malignant neoplasm of prostate: Secondary | ICD-10-CM | POA: Diagnosis not present

## 2024-05-13 LAB — RAD ONC ARIA SESSION SUMMARY
Course Elapsed Days: 31
Plan Fractions Treated to Date: 23
Plan Prescribed Dose Per Fraction: 2.5 Gy
Plan Total Fractions Prescribed: 28
Plan Total Prescribed Dose: 70 Gy
Reference Point Dosage Given to Date: 57.5 Gy
Reference Point Session Dosage Given: 2.5 Gy
Session Number: 23

## 2024-05-14 ENCOUNTER — Ambulatory Visit
Admission: RE | Admit: 2024-05-14 | Discharge: 2024-05-14 | Disposition: A | Discharge status: Home / Self Care | Source: Ambulatory Visit | Attending: Radiation Oncology | Admitting: Radiation Oncology

## 2024-05-14 ENCOUNTER — Other Ambulatory Visit: Payer: Self-pay

## 2024-05-14 DIAGNOSIS — Z510 Encounter for antineoplastic radiation therapy: Secondary | ICD-10-CM | POA: Diagnosis not present

## 2024-05-14 DIAGNOSIS — C61 Malignant neoplasm of prostate: Secondary | ICD-10-CM | POA: Diagnosis not present

## 2024-05-14 DIAGNOSIS — Z191 Hormone sensitive malignancy status: Secondary | ICD-10-CM | POA: Diagnosis not present

## 2024-05-14 LAB — RAD ONC ARIA SESSION SUMMARY
Course Elapsed Days: 32
Plan Fractions Treated to Date: 24
Plan Prescribed Dose Per Fraction: 2.5 Gy
Plan Total Fractions Prescribed: 28
Plan Total Prescribed Dose: 70 Gy
Reference Point Dosage Given to Date: 60 Gy
Reference Point Session Dosage Given: 2.5 Gy
Session Number: 24

## 2024-05-17 ENCOUNTER — Ambulatory Visit
Admission: RE | Admit: 2024-05-17 | Discharge: 2024-05-17 | Disposition: A | Discharge status: Home / Self Care | Source: Ambulatory Visit | Attending: Radiation Oncology | Admitting: Radiation Oncology

## 2024-05-17 ENCOUNTER — Other Ambulatory Visit: Payer: Self-pay

## 2024-05-17 DIAGNOSIS — C61 Malignant neoplasm of prostate: Secondary | ICD-10-CM | POA: Insufficient documentation

## 2024-05-17 DIAGNOSIS — Z191 Hormone sensitive malignancy status: Secondary | ICD-10-CM | POA: Diagnosis not present

## 2024-05-17 DIAGNOSIS — Z510 Encounter for antineoplastic radiation therapy: Secondary | ICD-10-CM | POA: Diagnosis not present

## 2024-05-17 LAB — RAD ONC ARIA SESSION SUMMARY
Course Elapsed Days: 35
Plan Fractions Treated to Date: 25
Plan Prescribed Dose Per Fraction: 2.5 Gy
Plan Total Fractions Prescribed: 28
Plan Total Prescribed Dose: 70 Gy
Reference Point Dosage Given to Date: 62.5 Gy
Reference Point Session Dosage Given: 2.5 Gy
Session Number: 25

## 2024-05-18 ENCOUNTER — Other Ambulatory Visit: Payer: Self-pay

## 2024-05-18 ENCOUNTER — Ambulatory Visit
Admission: RE | Admit: 2024-05-18 | Discharge: 2024-05-18 | Disposition: A | Discharge status: Home / Self Care | Source: Ambulatory Visit | Attending: Radiation Oncology | Admitting: Radiation Oncology

## 2024-05-18 DIAGNOSIS — Z510 Encounter for antineoplastic radiation therapy: Secondary | ICD-10-CM | POA: Diagnosis not present

## 2024-05-18 DIAGNOSIS — Z191 Hormone sensitive malignancy status: Secondary | ICD-10-CM | POA: Diagnosis not present

## 2024-05-18 DIAGNOSIS — C61 Malignant neoplasm of prostate: Secondary | ICD-10-CM | POA: Diagnosis not present

## 2024-05-18 LAB — RAD ONC ARIA SESSION SUMMARY
Course Elapsed Days: 36
Plan Fractions Treated to Date: 26
Plan Prescribed Dose Per Fraction: 2.5 Gy
Plan Total Fractions Prescribed: 28
Plan Total Prescribed Dose: 70 Gy
Reference Point Dosage Given to Date: 65 Gy
Reference Point Session Dosage Given: 2.5 Gy
Session Number: 26

## 2024-05-19 ENCOUNTER — Ambulatory Visit
Admission: RE | Admit: 2024-05-19 | Discharge: 2024-05-19 | Disposition: A | Discharge status: Home / Self Care | Source: Ambulatory Visit | Attending: Radiation Oncology | Admitting: Radiation Oncology

## 2024-05-19 ENCOUNTER — Ambulatory Visit

## 2024-05-19 ENCOUNTER — Other Ambulatory Visit: Payer: Self-pay

## 2024-05-19 DIAGNOSIS — C61 Malignant neoplasm of prostate: Secondary | ICD-10-CM | POA: Diagnosis not present

## 2024-05-19 DIAGNOSIS — Z510 Encounter for antineoplastic radiation therapy: Secondary | ICD-10-CM | POA: Diagnosis not present

## 2024-05-19 DIAGNOSIS — Z191 Hormone sensitive malignancy status: Secondary | ICD-10-CM | POA: Diagnosis not present

## 2024-05-19 LAB — RAD ONC ARIA SESSION SUMMARY
Course Elapsed Days: 37
Plan Fractions Treated to Date: 27
Plan Prescribed Dose Per Fraction: 2.5 Gy
Plan Total Fractions Prescribed: 28
Plan Total Prescribed Dose: 70 Gy
Reference Point Dosage Given to Date: 67.5 Gy
Reference Point Session Dosage Given: 2.5 Gy
Session Number: 27

## 2024-05-20 ENCOUNTER — Other Ambulatory Visit: Payer: Self-pay

## 2024-05-20 ENCOUNTER — Ambulatory Visit
Admission: RE | Admit: 2024-05-20 | Discharge: 2024-05-20 | Disposition: A | Source: Ambulatory Visit | Attending: Radiation Oncology

## 2024-05-20 DIAGNOSIS — Z191 Hormone sensitive malignancy status: Secondary | ICD-10-CM | POA: Diagnosis not present

## 2024-05-20 DIAGNOSIS — Z510 Encounter for antineoplastic radiation therapy: Secondary | ICD-10-CM | POA: Diagnosis not present

## 2024-05-20 DIAGNOSIS — C61 Malignant neoplasm of prostate: Secondary | ICD-10-CM | POA: Diagnosis not present

## 2024-05-20 LAB — RAD ONC ARIA SESSION SUMMARY
Course Elapsed Days: 38
Plan Fractions Treated to Date: 28
Plan Prescribed Dose Per Fraction: 2.5 Gy
Plan Total Fractions Prescribed: 28
Plan Total Prescribed Dose: 70 Gy
Reference Point Dosage Given to Date: 70 Gy
Reference Point Session Dosage Given: 2.5 Gy
Session Number: 28

## 2024-05-24 NOTE — Radiation Completion Notes (Addendum)
" °  Radiation Oncology         [phone] ________________________________  Name: Casey [name] MRN: [id number]  Date: 05/20/2024  DOB: 07-19-1945  Referring Physician: GARNETTE SHACK, M.D. Date of Service: 2024-05-24 Radiation Oncologist: Adina Barge, M.D. [facility]     RADIATION ONCOLOGY END OF TREATMENT NOTE     Diagnosis: 78 y.o. gentleman with Stage T1c adenocarcinoma of the prostate with Gleason Score of 4+3, and PSA of 11.4.   Intent: Curative     ==========DELIVERED PLANS==========  First Treatment Date: 2024-04-12 Last Treatment Date: 2024-05-20   Plan Name: Prostate Site: Prostate Technique: IMRT Mode: Photon Dose Per Fraction: 2.5 Gy Prescribed Dose (Delivered / Prescribed): 70 Gy / 70 Gy Prescribed Fxs (Delivered / Prescribed): 28 / 28     ==========ON TREATMENT VISIT DATES========== 2024-04-16, 2024-04-23, 2024-04-30, 2024-05-07, 2024-05-14, 2024-05-20     See weekly On Treatment Notes in Epic for details in the Media tab (listed as Progress notes on the On Treatment Visit Dates listed above).  He tolerated the radiation treatments well with only mild increased LUTS and modest fatigue.    The patient will receive a call in about one month from the radiation oncology department. He will continue follow up with Dr. Shack as well.  ------------------------------------------------   Donnice Barge, MD [facility]  Radiation Oncology Direct Dial: [phone]  Fax: [phone] [email or web address]  Skype  LinkedIn     "

## 2024-05-26 ENCOUNTER — Encounter: Payer: Self-pay | Admitting: Psychiatry

## 2024-05-26 ENCOUNTER — Ambulatory Visit (INDEPENDENT_AMBULATORY_CARE_PROVIDER_SITE_OTHER): Admitting: Psychiatry

## 2024-05-26 ENCOUNTER — Telehealth: Payer: Self-pay | Admitting: Psychiatry

## 2024-05-26 DIAGNOSIS — G4733 Obstructive sleep apnea (adult) (pediatric): Secondary | ICD-10-CM

## 2024-05-26 DIAGNOSIS — Z79899 Other long term (current) drug therapy: Secondary | ICD-10-CM | POA: Diagnosis not present

## 2024-05-26 DIAGNOSIS — G4710 Hypersomnia, unspecified: Secondary | ICD-10-CM | POA: Diagnosis not present

## 2024-05-26 DIAGNOSIS — F410 Panic disorder [episodic paroxysmal anxiety] without agoraphobia: Secondary | ICD-10-CM

## 2024-05-26 DIAGNOSIS — F3132 Bipolar disorder, current episode depressed, moderate: Secondary | ICD-10-CM | POA: Diagnosis not present

## 2024-05-26 DIAGNOSIS — F5105 Insomnia due to other mental disorder: Secondary | ICD-10-CM | POA: Diagnosis not present

## 2024-05-26 DIAGNOSIS — I6930 Unspecified sequelae of cerebral infarction: Secondary | ICD-10-CM | POA: Diagnosis not present

## 2024-05-26 DIAGNOSIS — F411 Generalized anxiety disorder: Secondary | ICD-10-CM

## 2024-05-26 MED ORDER — LITHIUM CARBONATE 150 MG PO CAPS: 450.0000 mg | ORAL_CAPSULE | Freq: Every day | ORAL | 0 refills | Status: AC

## 2024-05-26 MED ORDER — SERTRALINE HCL 50 MG PO TABS: 50.0000 mg | ORAL_TABLET | Freq: Every day | ORAL | 0 refills | Status: AC

## 2024-05-26 MED ORDER — PRAMIPEXOLE DIHYDROCHLORIDE 1 MG PO TABS: 1.0000 mg | ORAL_TABLET | Freq: Three times a day (TID) | ORAL | 0 refills | Status: DC

## 2024-05-26 NOTE — Telephone Encounter (Signed)
 Thank you.  This makes sense.  He's doing well so no med changes.  Keep taking all meds exactly as he is doing now.

## 2024-05-26 NOTE — Patient Instructions (Addendum)
 Call us  back to let us  know if you are or are not taking pramipexole   Check lithium  level any morning.

## 2024-05-26 NOTE — Telephone Encounter (Signed)
 LVM per DPR with recommendations from Dr. Geoffry.

## 2024-05-26 NOTE — Telephone Encounter (Signed)
 Please see message about pramipexole .

## 2024-05-26 NOTE — Progress Notes (Signed)
 Casey [name] [id number] 31-Jan-1946 78 y.o.    Subjective:   Patient ID:  Casey [name] is a 78 y.o. (DOB 19-Oct-1945) male.  Chief Complaint:  Chief Complaint  Patient presents with   Follow-up   Manic Behavior   Depression    HPI  Casey [name] presents for follow-up of bipolar disorder and anxiety and history of confusion  At visit  Dec 02, 2018.  He was having confusion and benztropine  was stopped to see if that was the cause.  Also it was having balance issues and therefore Trileptal  was switched all to nighttime 1200 mg nightly. Confusion much better per both of them with DC benztropine .  Balance better with change Trileptal .   visit was January 13, 2019 and he was doing well as noted above and he was encouraged not to make further med changes at that time.  visit April 13, 2019.  He was doing better with regard to depression than he had in quite some time on the Wellbutrin  and no meds were changed.  He was having health problems with pulmonary effusion and fluid removed twice related to fall in January 2020.  seen September 13, 2019.  For residual depression the following decisions were made: Balance problem much improved with dosing Trileptal  all in the evening. Depression under partial control & wife wants it to be better per his wife.  No mood swings.   Increase bupropion  XL to 2 of the 150 mg tablets in the morning until the current bottle is gone. Then new RX will be 1 of the 300 mg tablets each morning.  November 08, 2019 appointment the following is noted:  Seen with wife, Rock. Small amount of improvement with change.  Anxiety got worse.  Notices when he goes out.  Worries about social and political things.  Wife keeps him active at home.  Allerigies bother him..  No sig caffeine except tea when goes out.  Wife sees him as a little more active and initiative.  More productive.  Started some cooking.  Getting out and up better.  A little more talkative.   He wants to be  hyper but we know what happens. Wife Rock thinks   But for the last 2 years he's done nothing and laid around all the time.  He's a lot more active now. Big improvement. Went to church first time in 2 years. Plan:  For TRD, Pramipexole  0.25 mg tablet 1/2 twice daily for 1 week then 1 twice daily.  12/20/2019 appointment with the following noted:  Seen with wife. A little better motivated to get out but not to get in crowds.  Wife agrees more motivated and went to church once alone without pressure.  Wife says he's more talkative and much  Better interest.  Still not motivated to do things like mow the grass.  Others's have seen a difference. Concentration and memory are improved but not normal. No Se pramipexole .  Getting out of bed earlier and better now.. Wife admisters meds bc earlier he messsed them up. Plan: Wife has seen a big improvement therefore Partial improvement is clear from this so increase Pramipexole  0.375 mg tablet  twice daily  Anxiety is not fully managed.  Partly related to depression. Tremor is manageable.    02/15/20 appt with the following noted:  Send with wife Rock Doing good.  Increased pramipexole  as indicated.  Tolerating meds Wellbutrin  XL 300 mg AM & Trileptal  1200 mg HS with it. Much better with  depression.  Stays up more and speinding less time in bed.   Wife agrees he's much better and handling things better. 2 1/2 yo 4th cousin girl drowned in pond lately.  Big family. Wife pleased he handled it better. SE constipation ? Related. History Linzess  but diarrhea. He's not highly motivated but it is better.  Depression is much improved.  Had been depressed for years. Plan no med changes  06/13/2020 appointment with the following noted: Pretty good but cardiologist noted today increased pulse needing treatment.  Uncontrolled afib even after ablation. Hard to tell about depression bc of fatigue Dt heart problems. Wife said he did well Thanksgiving and participated  more in conversation per wife. Plan: no med changes  09/06/2020 appointment with following noted: Remains on Wellbutrin  XL 300 mg every morning, ginkgo biloba 40 mg twice daily, oxcarbazepine  1200 mg nightly, pramipexole  0.75 mg twice daily. Less sig health concerns. He thinks mood has been pretty good.  Consistent with meds. Wife thinks he stays tired and sleepy and wonders if can give him a stimulant.   Sleep good.  Will nap.  Doesn't bother him.  Walks dog 20 min in AM.  Not much initiative per wife.  Not depressed but when depressed he stayed in the bed lasting a couple of years.  History of OSA but lost weight and doesn't think he has apnea anymore.  No CPAP. Chronic afib. Not much caffeine. Plan: Wife& patient has seen a big improvement with Pramipexole  0.375 mg tablet  twice daily They ask consideration be given to the following: If his energy gets better by treating his tachycardia they would like to consider reducing the pramipexole  or possibly eliminating it at follow-up.   For energy trial reduction pramipexole  to 0.25 mg BID.  If gets more depressed call. Alternative of modafinil  100  12/04/2020 appointment with the following noted: Energy is better with awakening earlier at 8 and doing more and getting out more.  Going to bed later with 8 hour  Sleep. Mood fluctuates with more reacting to wife mainly.  More outspoken.  Per W he has to vent.  She thinks he is too focused on politics and issues.   Plan: Depression under control.  No mood swings.   Continue bupropion  XL 300 mg tablets each morning. Continue Trileptal  600 mg 2 at HS. No higher DT anxiety risk and no traditional stimulants DT afib.  Wife & patient has seen a big improvement with Pramipexole  and able to reduce to  0.25 mg tablet  twice daily  02/20/2021 appointment with the following noted:  seen with wife Prednisone  made him real hyper.  Been off of it for at least a couple weeks.  Hyperverbal and hyperactive.  Hip  pain interferes with sleep.  Sees doctor tomorrow. Average 3-4 hours at night and no naps bc busy.  No excessive spending but more than usual.  Denies appetite disturbance.  Patient reports that energy and motivation have been reduced.  Patient has difficulty with concentration and memory.  Patient denies any suicidal ideation.  W had MI in June. A/P: mild mania Wean Wellbutrin  over 2 weeks. Continue Trileptal  600 mg 2 at HS. Wife & patient has seen a big improvement with Pramipexole  and able to reduce to  0.25 mg tablet  twice daily. Continue it unless mania does not resolve with less bupropion .  03/26/2021 phone call: Rock stated Santiago has slightly improved since last week.He has not been able to sleep and was up at 2  am texting people in a disoriented state.His pcp put him on Ambien 10 mg and this has helped him sleep.However,linda stated he is still working him self too much.She said he is jittery and has to have something to do at all times.He does not want to slow down or get rest.She thinks he needs to cutback on the Mirapex  as well. MD response:  I agree with his wife about reducing Mirapex  from 1 tablet twice a day to half tablet twice a day for 1 week and if his symptoms are not resolved and stop the Mirapex .  04/18/2021 phone call with wife and MD: Patient is still manic after stopping Wellbutrin  and pramipexole .  Wife's concern is hard to get him to settle down at night.  Reviewed with wife prior psychiatric medications which are multiple.  Typically one would use an antipsychotic in this case but she does not think that they worked in the past.  She says he has been on higher doses of Trileptal  and tolerated it. Increase Trileptal  to 600 mg every morning and 1200 mg nightly for mania.  Keep scheduled appointment.  05/02/2021 wife reports patient still manic: We cannot go higher in the Trileptal  because of balance issues.  Therefore olanzapine  10 mg nightly was started and we discussed the  option of hospitalization.  We will try to get the patient in urgently to an appointment.  05/07/2021 appointment with the following noted: seen with wife, Rock Not doing too well.  Trouble getting out of bed.  Wife says he's still up in middle of the night but he says he's sleeping better.  Sleep in recliner downstairs. She says he's still manic with hyperactive, hyper graphia but won't show her what he's writing. On olanzapine   No changes off Wellbutrin  and pramipexole . Apparently more balance problems with increase Trileptal  to 1800 mg daily.  Dropped back to 1200 mg HS. Admits to some confusion.  Has pulled off labels on bottle per wife. She thinks he's averaging 4-6 hours of sleep and a little better than it was. Wife said he started cussing and he has not done that in the past. Other episodes of inappropriate decision making.  He recognizes he's manic but can't control himself.  Hyperverbal, pressured.  In past had speech problems with mania also.  Neg neuro work up for stroke in the past. SE balance and tremor. Some trouble getting out of chair? Plan: For mania,Increase olanzapine  to 15 mg PM Follow-up Friday for urgent visit  05/11/2021 appointment with the following noted: Calming down a little. He thinks he's sleeping better.  She says he needs whole Ambien with olanzapine  and last night still not sleeping.  He woke her with complaints of back pain. She's not seeing SE and nor is he.  She's not seeing much change  He says back pain is complicating his sleep and his problem. He tries to stay active.   She says he talks incessantly but not as much to her. She says major life events have triggered mania in him in the past. She notices him making poor decisions. Plan: Increase olanzapine  to 20 mg PM  05/24/2021 appointment with the following noted: with D Vina A week ago went of to doctor's office alone and wife called bc he was agitated. Per wife still going wide open but is  sleeping better. Yesterday had fever and talking out of his head and dx UTI. He still feels driven to do chores but distractible and inefficient.   D does  not see improvement and nor does wife.   7 hour sleep lately. Plan: Increase olanzapine  to 30 mg in evening Reduce oxcarbazepine  to 1 and 1/2 tablets in the evening.  06/04/2021 appointment: Seen with wife Sleeping more 7-9 hours.   Per wife still some confusion and still short tempered.  Less pressured sleep. He won't go to bed when sleepy and waits too long to get in bed and is weak and cannot do it by himself. Wife agrees he's less wide open than before and will be able to sit and watch TV show now. B died yesterday. Plan: Continue olanzapine  to 30 mg in evening Reduce oxcarbazepine   again to 1 tablets in the evening for 1 week, then 1/2 tablet at night for a week then stop it.  07/04/21 appt noted:  seen with wife Rock Hyper-ness is better.  Some trouble with concentration but able to make peanut butter fudge. Problems with afib.   Sleeping a lot and some instability.  Goes to sleep in chair.  Better with irritability.  No longer talking constantly but still some in spells.   Not depressed.   No change in balance off oxcarbazepine . Wife said last week mixed up meds and got oversedated.  She discovered the option. Plan: Reduce olanzapine  to 20 mg PM now that mania is largely resolved.  08/06/2021 appointment with the following noted: Only psych med is reduced olanzapine  20 mg HS RLS and fidgety.  All day.  Can sit for an hour.  Uncomfortable when sitting. Stopped Ginko and wife noticed RLS more.  She restarted it.  No longer rocking back and forth. Caffeine tea at lunch.  No coffee..  [place]undrop some. RLS recently started. Bipolar sx great per wife.  Not wanting to go out much. CO tiredness.  Falls asleep in recliner.  Sleep all night. Eats sweets all his life. Plan: No longer manic. Reduce olanzapine  to 15 mg earlier in  PM Stop caffeine  09/06/2021 appointment with the following noted: Reduced olanzapine  15 mg and still RLS all the day. No change in that.  But can sit in church ok.  Will move legs when standing and talking to people. Sleeping more than he should from 10 until 9. And still naps and little initiative bc tired. Not depressed but not back to normal.  Not as social as usual. Loves to eat out. Plan: Reduce olanzapine  from 15 to 7.5 mg earlier in PM Stop caffeine  10/02/2021 appointment with the following noted: seen again with wife Rock Only psych med is the reduced dose of olanzapine  7.5 mg for 30 days. Back pain limits activity and getting shots. Taking tramadol for pain.  1-2 prn.  One helps. 2 is sedating. RLS is better and mainly now the problem is back pain.  Has to sleep in recliner DT pain.   Rock says hard to tell about effect from reduced olanzapine  bc he's having such back pain and taking tramadol.  Doesn't feel like doing anything. Plan continue olanzapine  7.5 mg every afternoon  12/04/2021 appointment with the following noted: seen with wife Still doing well with olanzapine  7.5 mg PM No SE.  RLS is OK now. Sleep good 8-10 hours. Not depressed nor manic sx.   Happy with meds.   Wife gall bladder surgery. H helping wife. Plan: olanzapine  7.5 mg earlier in PM Stopcaffeine   03/19/2022 phone call: Wife called complaining that her husband sleeping 14 to 15 hours a day on olanzapine  7.5 mg daily and wondering  about trying to reduce it. MD response: Olanzapine  is his only mood stabilizer at present.  There is a risk that he could get manic again with the dosage reduction.  But we can try reducing from 7.5 to 5 mg at night.  That may or may not help with his tendency to sleep excessively.  Some people will do that even on low doses of olanzapine .  Please send in prescription for olanzapine  5 mg tablets 1 nightly #30, 1 additional refill and make a note in the pharmacy section of the  order to cancel any other olanzapine  refills.     06/04/2022 appointment noted:  wife says a tad better with less olanzapine  but still wants to sleep and sit around a lot. Keeps saying I'm not depressed.   Reduced olanzapine  5 mg HS. No mania or depression. Hx sleep study and dx OSA but per wife got better with wt loss.  Not using CPAP. Eating less but wt loss stable. Asks about seasonal pattern bc of the history.  She can't remember the seasonal pattern.   Plan: Reduce  DT sleepiness olanzapine  2.5 mg earlier in PM Mild TD   07/29/22 TC: no changes with reduction so increased back to 5 mg olanzapine .  08/14/22 appt noted: Still sleeping too much 12 hours.   Also been irritable with flashes.   Something can set him off. Probably getting depressed some with less olanzapine .  Some anxiety.   Some reduction in interest and not going to church like before and doesn't like to be around people.  Doesn't want wife to help others either.   No restlessness.  Will nap in the afternoon. Not enjoying things and not laughing. Plan: Is getting more depressed again, resume Wellbutrin  XL up to 300 mg AM  10/14/22 appt noted:  with wife Rock Psych Med: olanzapine  5 HS, Wellbutrin  300 mg AM Feels a little more like getting out but wife says not much.  She sees him being a little more agitated and irritable.  Still doesn't want to go out normally or be with other people.  Usual self is social.  Don't talk as much.  Doesn't want to be alone and doesn't want wife to be social. Sleeps a lot.  Partly DT back pain. Not nervous or anxious. Plan: Is having apathetic depressed again,  and failed Wellbutrin .   Resume pramipexole  off label which worked before,  0.25 mg BID for 4 days then 0.375 mg BID  10/23/22 TC: still dep and wanting to increase pramipexole .  MD resp: incr to 0.5 mg BID  11/24/22 and neuro FU 01/08/23 noted:  follow-up for acute right frontal juxtacortical infarct most slightly due to A-fib due  to noncompliance with Xarelto .  Patient is here with his wife today.  He feels that he has returned to his baseline.   02/27/23 appt noted:  with Rock Since stroke not back to church.  No energy and not wanting to do anything.  Sleep 12 hours and watches TV and recliner.  Wife says sleep excess no change since stroke.  Still dep.   Still wanting to isolate from others. No anxiety. Plan: Stop olanzapine  Start Vraylar 1 in the AM Reduce pramipexole  to 1 twice daily for 4 days then 1/2 twice daily for 4 days then stop it. This plan will reduce meds and answer question about sleepiness from olanzapine  and reduce polypharmacy  03/18/23 TC Wife reporting no improvement in depression and he is still sleeping all the time. She  is also reporting it is too expensive and they would not be able to afford it, but PA has not been done. Requesting to go back to previous medications.  CC resp: Me  to Con Dawna DASEN, CMA  CC   03/19/23  6:39 PM Note It has not been long enough and I need to see him to find an alternative to Vraylar.  Stay on the Vraylar 1.5 mg daily until his appt later this month.  I don't want to resume olanzapine  which didn't work until we explore other options.     04/10/23 appt noted: seen with wife Rock Pencil meds: Vraylar 1.5 mg daily.  No olanzapine , pramipexole  stopped. Sleeping about the same but easier to get up per wife on Vraylar.  No change in mood.  Still feels dep.  Not wanting to go out and be around people.  Without change.  No social life.  More HA.   Still dealing with chronic TR afib and tx ongoing. Easily out of breath.   Will walk dog 10 mins.   Tires him.  Sleeps a lot during the day. Hx OSA but since lost wt not needing CPAP Doesn't laugh much. Not going to church DT dep. Worry a lot without change.  Will resolve when not dep.   Plan: Stop Vraylar Start 3 of the 150 mg capsules of lithium  at night Get a blood level in the morning 5-7 days later Use modafinil   for alertness if needed  05/26/23 appt noted:  seen with D Vina Meds: lithium  450 HS, no Vraylar, modafinil  prn.   Lithium  level 0.4, Cr 1.34 pretty stable. Mood kind of OK.  Sleep is still too much.   Wife's notes no big change but is awake more than he was.  Hard to get OOB before 11.   EKG per card was normal QT but history permanent atrial fibrillation with hx stroke DT unintentional noncompliance with anticoagulation.  Plan:  lithium  450 HS.  Lithium  0.4.   Option modafinil  if not better in a couple of weeks.  Resume pramipexole  increase, to 0.375 mg BID off label for depression.  06/24/23 appt noted:  seen with wife, Rock Pencil meds: Lithium  450 nightly, pramipexole  0.25 mg tablets 1-1/2 twice daily, no modafinil  100 mg every morning About the same but a little better.  Getting out and going more . Per wife seems some better but she doesn't think he's doing much  and not as much as normal.  Not sig more social and not normal for  him. No sig SE.   Physically is moving better.   To bed 9 and will get up at 930 am instead of 11.   Pramipexole  helped a little. Mild stroke in May.  Will have Watchman device placed. Plan: For TRD increase pramipexole  to 0.5 mg BID and then to previous dose of 0.75 mg BID  07/29/23 appt noted:  with wife Meds: incr pramipexole  to 0.75 mg BID, lithium  450 mg daily,  Per wife: Absolutely  re: improvement per wife.  Getting out more and more wsocial and motivated.  More household chores. He noticed I'm just myself.  Talking more.  More interested.  Dep resolved. Noticed benefit gradually over a couple of weeks. More involuntary tongue movements  09/01/23 appt noted: with wife Rock Meds: as above. A little more active and getting out more and less depressed and more interested. SE tremor occ &  Tongue movements about the same and when nervous is worse. Sleep  well.  Wife says dep much better.  Some laughter occ which hasn't been there.  She  doesn't feel he is all the way well.  Still likes to sit around a lot. Not as consisstent with Xarelto  as he should be.  Had 2-3 ministrokes.  Pending surgery to keep that from being necessary. Plan incr pramipexole  1 mg BID  09/25/23 appt noted: Med: incr pramipexole  1 mg BID, lithium  150 mg 3 daily, modafinil  100 AM,  A little bit better.  Health concerns with ministrokes.  Pending Watchman insert for perm a fib.  W notes he's talking more and a little more active but still not motivated to go out much and doesn't want her to go out much.  He doesn't want to go out much until his health is better with breathing.   Not really dep but kind of down with health issues dragging out and work up dragging.  Worrying over health.  12/09/23 appt noted: Med:  pramipexole  1 mg BID, lithium  150 mg 3 daily, NO modafinil  100 AM,  Started having panic.  When goes walking and feels normal.  But can be triggered by heat and humidity with px breathing.  Happened going to [employer].  Lungs checked out fine.   Was going to have Watchman put in and that's when panic started.  So didn't get it done.  But panic continues.  Wife said panic can occur going in the restaurant but does seem to have trouble with exertion.  Not daily.  Only lasts 2-3 mins. Since stroke worrying about having ministrokes.   Not dep but wife notes he's not confident being alone.  Doesn't want to socialize.  Loves to go out and eat.   Plan: Sertraline  best antipanic opiton available low dose 25-50 mg  02/10/24 appt noted:  Med:  pramipexole  1 mg BID, lithium  150 mg 3 daily, NO modafinil  100 AM,  sertraline  50 Doing about the same overall but pill helped.   Was having SOB DT heat.  Doesn't like going outside.  Heat affects thinking and slows him down.  Reduced panic.  Much much better per wife with panic.   No known SE Mood affected by health concerns.  Awaiting prostate CA FU labs.   Loves to eat out but not socialize per wife and he doesn't  like her leaving.   He's concerned most o f her friends have CA.   Hasn't been to church in 2 years and used to be first one in the church.   Not doing anything in the house. Went to PCP last week and got B12 shot to see if energy is better.  No watchman but taking blood thinner.   Not sad.  Low interest except TV and news.  Used to have mor einterest and was out going but not now.   Wife notices lip smacking and protruding tongue.  Plan no changes  05/25/24 appt noted: Med:  lithium  450 mg daily, sertraline  50 helped anxiety.  He thinks he stopped pramipexole  Feels much better.  For 3-4 weeks.   Decided not to do heart Right now he is perfect per wife.  Back to social, more active, not manic.  Good interest.  Better interaction. Finished radiation for prostate CA.   Euthymic.   Anxiety managed.  Past Psychiatric Medication Trials: Seroquel  300,  olanzapine  30 resolved mania,  (Improved  with Increase olanzapine  to 30 mg PM and off the oxcarbazepine .) perphenazine, risperidone,  Latuda,   Abilify, Vraylar 1.5  mg for 1 month without benefit Lithium  did great helped but SE afib, kidneys and prostate,    alprazolam ,  Depakote, Trileptal  1800 balance problems  carbamazepine , lamotrigine  caused nausea,  citalopram, sertraline  150,    Wellbutrin  300 anxiety and irritability and failed 10/2022,   Pramipexole  0.375 BID helped intially, up to 1 BID Benztropine  confusion        1 prior psych hosp depression                                                    Review of Systems:  Review of Systems  Constitutional:  Positive for fatigue.  HENT:         Clicking teeth  Eyes:  Negative for visual disturbance.  Respiratory:  Positive for shortness of breath. Negative for cough.   Gastrointestinal:  Positive for constipation.  Musculoskeletal:  Positive for arthralgias, back pain and gait problem.  Skin:  Negative for rash.       chronic  Neurological:  Positive for tremors and  weakness.       Balance px longterm no worse More tongue movements  Psychiatric/Behavioral:  Negative for agitation, behavioral problems, confusion, decreased concentration, dysphoric mood, hallucinations, self-injury, sleep disturbance and suicidal ideas. The patient is not nervous/anxious and is not hyperactive.     Medications: I have reviewed the patient's current medications.  Current Outpatient Medications  Medication Sig Dispense Refill   acetaminophen  (TYLENOL ) 500 MG tablet Take 1,000 mg by mouth every 6 (six) hours as needed for headache.     albuterol  (VENTOLIN  HFA) 108 (90 Base) MCG/ACT inhaler Inhale 2 puffs into the lungs every 6 (six) hours as needed (Asthma).     Cholecalciferol (VITAMIN D3) 250 MCG (10000 UT) capsule Take 10,000 Units by mouth daily.     levothyroxine  (SYNTHROID , LEVOTHROID) 25 MCG tablet Take 25 mcg by mouth daily before breakfast.     metoprolol  succinate (TOPROL -XL) 25 MG 24 hr tablet Take 1 tablet by mouth once daily 90 tablet 3   pantoprazole  (PROTONIX ) 40 MG tablet TAKE ONE TABLET BY MOUTH ONCE DAILY 30 tablet 11   rivaroxaban  (XARELTO ) 20 MG TABS tablet Take 20 mg by mouth daily after supper.      rosuvastatin  (CRESTOR ) 20 MG tablet Take 1 tablet (20 mg total) by mouth daily. 30 tablet 0   SUPER B COMPLEX/C PO Take 1 tablet by mouth daily.     lithium  carbonate 150 MG capsule Take 3 capsules (450 mg total) by mouth at bedtime. 270 capsule 0   pramipexole  (MIRAPEX ) 1 MG tablet Take 1 tablet (1 mg total) by mouth 3 (three) times daily. 270 tablet 0   sertraline  (ZOLOFT ) 50 MG tablet Take 1 tablet (50 mg total) by mouth daily. 90 tablet 0   No current facility-administered medications for this visit.    Medication Side Effects: resolved  Allergies:  Allergies  Allergen Reactions   Amlodipine Other (See Comments)    Stopped due to kidney issues   Vicodin [Hydrocodone -Acetaminophen ] Other (See Comments)    Patient is unsure of reaction    Past  Medical History:  Diagnosis Date   Arthritis    Asthma    Atrial fibrillation (HCC) 01/12/2010   2D Echo EF=>55%   Bipolar disorder (HCC)    Chronic back pain    Depression  Dysrhythmia    AFib   Elevated PSA    GERD (gastroesophageal reflux disease)    History of colonic polyps    History of gout    Hyperlipidemia    Hypertension    Hypothyroidism    Morbid obesity (HCC)    OSA (obstructive sleep apnea)    AHI was 27.62hr RDI was 34.3 hr REM 0.00hr; had CPAP years ago but no longer uses.   Prostatitis    Tubular adenoma of colon 01/2016    Family History  Problem Relation Age of Onset   Diabetes Mother    Heart failure Mother    Hypertension Mother    Hypertension Father    Cancer Paternal Grandmother    Stroke Sister    Liver disease Brother    Vascular Disease Brother    Arthritis Brother    Colon cancer Neg Hx     Social History   Socioeconomic History   Marital status: Married    Spouse name: Not on file   Number of children: Not on file   Years of education: Not on file   Highest education level: Not on file  Occupational History   Not on file  Tobacco Use   Smoking status: Never   Smokeless tobacco: Never  Vaping Use   Vaping status: Never Used  Substance and Sexual Activity   Alcohol use: No    Alcohol/week: 0.0 standard drinks of alcohol   Drug use: No   Sexual activity: Not Currently    Birth control/protection: None  Other Topics Concern   Not on file  Social History Narrative   Not on file   Social Drivers of Health   Financial Resource Strain: Not on file  Food Insecurity: No Food Insecurity (03/17/2024)   Hunger Vital Sign    Worried About Running Out of Food in the Last Year: Never true    Ran Out of Food in the Last Year: Never true  Transportation Needs: No Transportation Needs (03/17/2024)   PRAPARE - Administrator, Civil Service (Medical): No    Lack of Transportation (Non-Medical): No  Physical Activity: Not on  file  Stress: Not on file  Social Connections: Not on file  Intimate Partner Violence: Not At Risk (03/17/2024)   Humiliation, Afraid, Rape, and Kick questionnaire    Fear of Current or Ex-Partner: No    Emotionally Abused: No    Physically Abused: No    Sexually Abused: No    Past Medical History, Surgical history, Social history, and Family history were reviewed and updated as appropriate.   Please see review of systems for further details on the patient's review from today.   Objective:   Physical Exam:  There were no vitals taken for this visit.  Physical Exam Constitutional:      General: He is not in acute distress. Musculoskeletal:        General: No deformity.  Neurological:     Mental Status: He is alert and oriented to person, place, and time.     Gait: Gait normal.     Comments: Mild rocking motions legs resolved Mild + lip licking and buccal movements ongoing Uses cane  Psychiatric:        Attention and Perception: He is attentive. He does not perceive auditory hallucinations.        Mood and Affect: Mood is not anxious or depressed. Affect is not labile, blunt, angry or inappropriate.  Speech: Speech is not rapid and pressured or tangential.        Behavior: Behavior normal. Behavior is not agitated.        Thought Content: Thought content normal. Thought content is not delusional. Thought content does not include homicidal or suicidal ideation. Thought content does not include suicidal plan.        Cognition and Memory: Cognition normal.        Judgment: Judgment normal.     Comments: Insight fair.   No auditory or visual hallucinations. No delusions.  Anxiety resolved Gait is normal again. Lip smacking minimal More talkative , engaged, interested, not as blunted no longer socially avoidant No mania.     Lab Review:     Component Value Date/Time   NA 142 02/25/2024 1150   K 3.8 02/25/2024 1150   CL 105 02/25/2024 1150   CO2 22 02/25/2024 1150    GLUCOSE 118 (H) 02/25/2024 1150   GLUCOSE 104 (H) 12/29/2022 0443   BUN 17 02/25/2024 1150   CREATININE 1.23 02/25/2024 1150   CALCIUM  9.0 02/25/2024 1150   PROT 7.0 11/24/2022 1252   PROT 6.6 12/28/2018 0941   ALBUMIN 3.9 11/24/2022 1252   ALBUMIN 4.5 12/28/2018 0941   AST 18 11/24/2022 1252   ALT 13 11/24/2022 1252   ALKPHOS 70 11/24/2022 1252   BILITOT 0.8 11/24/2022 1252   BILITOT 0.7 12/28/2018 0941   GFRNONAA >60 12/29/2022 0443   GFRAA 63 12/28/2018 0941       Component Value Date/Time   WBC 7.7 08/22/2023 1229   WBC 5.7 12/29/2022 0443   RBC 4.23 08/22/2023 1229   RBC 4.23 12/29/2022 0443   HGB 13.6 08/22/2023 1229   HCT 41.6 08/22/2023 1229   PLT 181 08/22/2023 1229   MCV 98 (H) 08/22/2023 1229   MCH 32.2 08/22/2023 1229   MCH 31.9 12/29/2022 0443   MCHC 32.7 08/22/2023 1229   MCHC 33.7 12/29/2022 0443   RDW 12.4 08/22/2023 1229   LYMPHSABS 0.7 12/29/2022 0443   LYMPHSABS 1.1 12/28/2018 0941   MONOABS 0.5 12/29/2022 0443   EOSABS 0.2 12/29/2022 0443   EOSABS 0.1 12/28/2018 0941   BASOSABS 0.0 12/29/2022 0443   BASOSABS 0.0 12/28/2018 0941    Lithium  Lvl  Date Value Ref Range Status  02/25/2024 0.2 (L) 0.5 - 1.2 mmol/L Final    Comment:    A concentration of 0.5-0.8 mmol/L is advised for long-term use; concentrations of up to 1.2 mmol/L may be necessary during acute treatment.                                  Detection Limit = 0.1                           <0.1 indicates None Detected      01/13/24 high PSA 11.4 with bx pendinding  No results found for: PHENYTOIN, PHENOBARB, VALPROATE, CBMZ   .res Assessment: Plan:    Trelyn was seen today for follow-up, manic behavior and depression.  Diagnoses and all orders for this visit:  Bipolar affective disorder, currently depressed, moderate (HCC) -     Lithium  level -     pramipexole  (MIRAPEX ) 1 MG tablet; Take 1 tablet (1 mg total) by mouth 3 (three) times daily. -     lithium  carbonate 150  MG capsule; Take 3 capsules (450 mg  total) by mouth at bedtime. -     Lithium  level  Lithium  use -     Lithium  level  Panic disorder without agoraphobia -     sertraline  (ZOLOFT ) 50 MG tablet; Take 1 tablet (50 mg total) by mouth daily.  Generalized anxiety disorder -     sertraline  (ZOLOFT ) 50 MG tablet; Take 1 tablet (50 mg total) by mouth daily.  Hypersomnolence disorder, acute, moderate  Obstructive sleep apnea syndrome  History of stroke with residual effects  Insomnia due to mental condition   Likely TD   30 min face to face time with patient and wife. We discussed Dx bipolar at 89 yo.  Bipolar depression has recurred again like last year but so far is not as severe.  Patient is very complicated because of weakness and medical problems.   History pseudodementia with depression. Prednisone  triggered some mania in 2023 and persisted off prednisone .    Recent history of afib with secondary stroke.  Dep  better again with pramipexole  1 mg BID so increase to 1 mg TID;  but he's taken it all at once daily.  Doing well so no change  Unclear reason for new onset panic.  Worrying over W.  Some health fears.  ? Re: pramipexole .   Now better with sertraline   Mild lip smacking consistent with TD and appears worse with addtion of prampexole..   But is not disturbing to pt nor his wife.  If still a problem with sleepiness DT multiple med failures consider modafinil  to manage.  Cr is abnormal.  Failed multiple alternatives.  Disc risks lithium  but it is what worked best.  Will try LED DT medical problems including chronic afib. Counseled patient regarding potential benefits, risks, and side effects of lithium  to include potential risk of lithium  affecting thyroid  and renal function.  Discussed need for periodic lab monitoring to determine drug level and to assess for potential adverse effects.  Counseled patient regarding signs and symptoms of lithium  toxicity and advised that they  notify office immediately or seek urgent medical attention if experiencing these signs and symptoms.  Patient advised to contact office with any questions or concerns.  h lithium  450 HS.   Lithium  0.4.  04/22/23. CR 1.45 on 05/26/23.  1.51 on 08/22/23 Cr 1.20 Jul 2023.  Disc this concern at length.  Might have to change again if progresses further. 07/01/23 Lithium  0.5.  no dosage change.  Had tremor at higher doses  Check lithium  level.   Option modafinil  if not better .  defer Normal Qtc on EKG since here but afib.  Re: panic hesitant to add more meds but may be necessary.   Dont want to use BZ unless essential. Continue Sertraline  best antipanic opiton available low dose 50 mg.  It has worked.  Disc SE incl mania risk.    Plan: sx into remission with Pramipexole  3 mg daily Sertaline 50 mg daily Lithium  450 mg HS  Follow-up 3 mos   Lorene Macintosh, MD, DFAPA   Please see After Visit Summary for patient specific instructions.   Future Appointments  Date Time Provider Department Center  07/27/2024 11:15 AM Matilda Senior, MD [facility] None  08/24/2024 11:00 AM Cottle, Lorene KANDICE Raddle., MD [facility] None      Orders Placed This Encounter  Procedures   Lithium  level   Lithium  level       -------------------------------

## 2024-05-26 NOTE — Telephone Encounter (Signed)
 Pt's wife Lvm @ 3:49p.  Pt had appt today.  His wife is on [place].  She said Dr Geoffry wanted them to call back and advise if pt was taking Pramipexole .  She is reporting back that the pt has been taking all 3 pills at one time, not 3 times a day.  Pls call her back to discuss.  Next appt 2/10

## 2024-05-27 ENCOUNTER — Telehealth: Payer: Self-pay | Admitting: Psychiatry

## 2024-05-27 NOTE — Telephone Encounter (Signed)
 Had LVM last night with recommendations from Dr. Geoffry. Called again this morning and provided the same information.

## 2024-05-27 NOTE — Telephone Encounter (Signed)
 Linda-wife lvm 6:12 pm stating missed call. Please call back @ [phone]

## 2024-05-31 NOTE — Progress Notes (Signed)
 Patient was a RadOnc Consult on 03/17/24 for his stage T1c adenocarcinoma of the prostate with Gleason score of 4+3, and PSA of 11.4.  Patient proceed with treatment recommendations of 5.5 weeks of external beam therapy and had his final radiation treatment on 05/20/24.   Patient will be scheduled for a post treatment nurse call and has a follow up with urology on 07/27/2024.

## 2024-06-03 DIAGNOSIS — F3132 Bipolar disorder, current episode depressed, moderate: Secondary | ICD-10-CM | POA: Diagnosis not present

## 2024-06-04 LAB — LITHIUM LEVEL: Lithium Lvl: 0.6 mmol/L (ref 0.5–1.2)

## 2024-06-14 NOTE — Progress Notes (Signed)
  Radiation Oncology         [phone] ________________________________  Name: Casey [name] MRN: [id number]  Date of Service: 06/22/2024  DOB: 1946-02-01  Post Treatment Telephone Note  Diagnosis:  78 y.o. gentleman with Stage T1c adenocarcinoma of the prostate with Gleason Score of 4+3, and PSA of 11.4.   Pre Treatment IPSS Score: 4 (as documented in the provider consult note)  The patient was available for call today.   Symptoms of fatigue have improved since completing therapy.  Symptoms of bladder changes have improved since completing therapy. Current symptoms include some urgency, and medications for bladder symptoms include- none at this time.  Symptoms of bowel changes have improved since completing therapy. Current symptoms include some constipation, and medications for bowel symptoms include stool softeners.   Post Treatment IPSS Score: IPSS Questionnaire (AUA-7): Over the past month.   1)  How often have you had a sensation of not emptying your bladder completely after you finish urinating?  2 - Less than half the time  2)  How often have you had to urinate again less than two hours after you finished urinating? 2 - Less than half the time  3)  How often have you found you stopped and started again several times when you urinated?  1 - Less than 1 time in 5  4) How difficult have you found it to postpone urination?  5 - Almost always  5) How often have you had a weak urinary stream?  3 - About half the time  6) How often have you had to push or strain to begin urination?  2 - Less than half the time  7) How many times did you most typically get up to urinate from the time you went to bed until the time you got up in the morning?  2 - 2 times  Total score:  17. Which indicates moderate symptoms  0-7 mildly symptomatic   8-19 moderately symptomatic   20-35 severely symptomatic   Patient has a scheduled follow up visit with his urologist, Dr. Matilda, on 07/27/24  for ongoing surveillance. He was counseled that PSA levels will be drawn in the urology office, and was reassured that additional time is expected to improve bowel and bladder symptoms. He was encouraged to call back with concerns or questions regarding radiation.

## 2024-06-18 ENCOUNTER — Telehealth: Payer: Self-pay | Admitting: Cardiovascular Disease

## 2024-06-18 NOTE — Telephone Encounter (Signed)
 Call to patient, 2 identifiers used, spoke with patient and spouse. They report patient has had worsening SOB on exertion for the past 5 months. He states the SOB does resolve with rest. He has known asthma but states he has not been using his albuterol  inhaler. Then today, he fell twice. The first time he was walking up the ramp to his house with his cane and felt himself going down. Spouse does endorse he was breathing hard. She says he is probably very weak because he has been sedentary recently and he has also undergone 28 radiation treatments for prostate cancer.   Later in the day he went to the bathroom and fell, cutting his arm. He denies lightheadedness or losing consciousness with either fall. He gives his BP as 139/76 and HR as 80. He denies palpitations or chest pain.   Advised patient to go to ED for work up as spouse endorses he has been wheezing lately. Patient and spouse verbalize understanding and agree to plan.

## 2024-06-18 NOTE — Telephone Encounter (Signed)
 Pt c/o Shortness Of Breath: STAT if SOB developed within the last 24 hours or pt is noticeably SOB on the phone  1. Are you currently SOB (can you hear that pt is SOB on the phone)?  No   2. How long have you been experiencing SOB?  About 5 months + worsening and becoming more frequent  3. Are you SOB when sitting or when up moving around?  When up and moving around  4. Are you currently experiencing any other symptoms?  Has panic attacks + had a fall today

## 2024-06-22 ENCOUNTER — Ambulatory Visit
Admission: RE | Admit: 2024-06-22 | Discharge: 2024-06-22 | Disposition: A | Source: Ambulatory Visit | Attending: Radiation Oncology

## 2024-06-22 DIAGNOSIS — C61 Malignant neoplasm of prostate: Secondary | ICD-10-CM

## 2024-06-24 DIAGNOSIS — B351 Tinea unguium: Secondary | ICD-10-CM | POA: Diagnosis not present

## 2024-06-24 DIAGNOSIS — G10 Huntington's disease: Secondary | ICD-10-CM | POA: Diagnosis not present

## 2024-06-24 DIAGNOSIS — M79672 Pain in left foot: Secondary | ICD-10-CM | POA: Diagnosis not present

## 2024-07-02 ENCOUNTER — Other Ambulatory Visit: Payer: Self-pay | Admitting: Urology

## 2024-07-02 DIAGNOSIS — C61 Malignant neoplasm of prostate: Secondary | ICD-10-CM

## 2024-07-21 ENCOUNTER — Ambulatory Visit: Attending: Cardiovascular Disease | Admitting: Cardiovascular Disease

## 2024-07-21 ENCOUNTER — Ambulatory Visit

## 2024-07-21 ENCOUNTER — Encounter: Payer: Self-pay | Admitting: Cardiovascular Disease

## 2024-07-21 VITALS — BP 104/68 | HR 71 | Ht 72.0 in | Wt 213.8 lb

## 2024-07-21 DIAGNOSIS — G4733 Obstructive sleep apnea (adult) (pediatric): Secondary | ICD-10-CM

## 2024-07-21 DIAGNOSIS — E7800 Pure hypercholesterolemia, unspecified: Secondary | ICD-10-CM

## 2024-07-21 DIAGNOSIS — F319 Bipolar disorder, unspecified: Secondary | ICD-10-CM | POA: Diagnosis not present

## 2024-07-21 DIAGNOSIS — R55 Syncope and collapse: Secondary | ICD-10-CM

## 2024-07-21 DIAGNOSIS — R0602 Shortness of breath: Secondary | ICD-10-CM | POA: Diagnosis not present

## 2024-07-21 DIAGNOSIS — I4821 Permanent atrial fibrillation: Secondary | ICD-10-CM | POA: Diagnosis not present

## 2024-07-21 DIAGNOSIS — I639 Cerebral infarction, unspecified: Secondary | ICD-10-CM

## 2024-07-21 DIAGNOSIS — D6869 Other thrombophilia: Secondary | ICD-10-CM

## 2024-07-21 NOTE — Patient Instructions (Signed)
 Medication Instructions:  Your physician recommends that you continue on your current medications as directed. Please refer to the Current Medication list given to you today.  *If you need a refill on your cardiac medications before your next appointment, please call your pharmacy*  Testing/Procedures: ZIO XT- Long Term Monitor Instructions  Your physician has requested you wear a ZIO patch monitor for 14 days.  This is a single patch monitor. [employer] supplies one patch monitor per enrollment. Additional stickers are not available. Please do not apply patch if you will be having a Nuclear Stress Test,  Echocardiogram, Cardiac CT, MRI, or Chest Xray during the period you would be wearing the  monitor. The patch cannot be worn during these tests. You cannot remove and re-apply the  ZIO XT patch monitor.  Your ZIO patch monitor will be mailed 3 day USPS to your address on file. It may take 3-5 days  to receive your monitor after you have been enrolled.  Once you have received your monitor, please review the enclosed instructions. Your monitor  has already been registered assigning a specific monitor serial # to you.  Billing and Patient Assistance Program Information  We have supplied [employer] with any of your insurance information on file for billing purposes. [employer] offers a sliding scale Patient Assistance Program for patients that do not have  insurance, or whose insurance does not completely cover the cost of the ZIO monitor.  You must apply for the Patient Assistance Program to qualify for this discounted rate.  To apply, please call [employer] at [phone], select option 4, select option 2, ask to apply for  Patient Assistance Program. Meredeth will ask your household income, and how many people  are in your household. They will quote your out-of-pocket cost based on that information.  [employer] will also be able to set up a 34-month, interest-free payment plan if needed.  Applying the  monitor   Shave hair from upper left chest.  Hold abrader disc by orange tab. Rub abrader in 40 strokes over the upper left chest as  indicated in your monitor instructions.  Clean area with 4 enclosed alcohol pads. Let dry.  Apply patch as indicated in monitor instructions. Patch will be placed under collarbone on left  side of chest with arrow pointing upward.  Rub patch adhesive wings for 2 minutes. Remove white label marked 1. Remove the white  label marked 2. Rub patch adhesive wings for 2 additional minutes.  While looking in a mirror, press and release button in center of patch. A small green light will  flash 3-4 times. This will be your only indicator that the monitor has been turned on.  Do not shower for the first 24 hours. You may shower after the first 24 hours.  Press the button if you feel a symptom. You will hear a small click. Record Date, Time and  Symptom in the Patient Logbook.  When you are ready to remove the patch, follow instructions on the last 2 pages of Patient  Logbook. Stick patch monitor onto the last page of Patient Logbook.  Place Patient Logbook in the blue and white box. Use locking tab on box and tape box closed  securely. The blue and white box has prepaid postage on it. Please place it in the mailbox as  soon as possible. Your physician should have your test results approximately 7 days after the  monitor has been mailed back to [facility].  Call [facility] Customer Care at  [phone] if you have questions regarding  your ZIO XT patch monitor. Call them immediately if you see an orange light blinking on your  monitor.  If your monitor falls off in less than 4 days, contact our Monitor department at [phone].  If your monitor becomes loose or falls off after 4 days call [employer] at [phone] for  suggestions on securing your monitor     Your physician has requested that you have an echocardiogram. Echocardiography is a painless  test that uses sound waves to create images of your heart. It provides your doctor with information about the size and shape of your heart and how well your hearts chambers and valves are working. This procedure takes approximately one hour. There are no restrictions for this procedure. Please do NOT wear cologne, perfume, aftershave, or lotions (deodorant is allowed). Please arrive 15 minutes prior to your appointment time.  Please note: We ask at that you not bring children with you during ultrasound (echo/ vascular) testing. Due to room size and safety concerns, children are not allowed in the ultrasound rooms during exams. Our front office staff cannot provide observation of children in our lobby area while testing is being conducted. An adult accompanying a patient to their appointment will only be allowed in the ultrasound room at the discretion of the ultrasound technician under special circumstances. We apologize for any inconvenience.   Follow-Up: At [facility], you and your health needs are our priority.  As part of our continuing mission to provide you with exceptional heart care, our providers are all part of one team.  This team includes your primary Cardiologist (physician) and Advanced Practice Providers or APPs (Physician Assistants and Nurse Practitioners) who all work together to provide you with the care you need, when you need it.  Your next appointment:   1 year(s)  Provider:   Jerel Balding, MD

## 2024-07-21 NOTE — Progress Notes (Unsigned)
 Enrolled patient for a 14 day Zio XT  monitor to be mailed to patients home

## 2024-07-21 NOTE — Progress Notes (Signed)
 "   Cardiology Office Note   Date:  07/25/2024   ID:  Casey [name], DOB 1946/01/03, MRN [id number]  PCP:  [name], MD  Cardiologist:  Carvel Huskins Electrophysiologist:  Fonda Kitty, MD   Evaluation Performed:  Follow-Up Visit  Chief Complaint:  AFib  History of Present Illness:    Casey [name] is a 79 y.o. male with history of stroke (left sided weakness and facial droop, right frontal ischemic infarct 11/24/2022 in the setting of noncompliance with anticoagulants), permanent atrial fibrillation, but without other major structural cardiac abnormalities.  He has obstructive sleep apnea and longstanding struggles with bipolar disorder.  He completed radiation therapy for prostate cancer in October 2025.  He has had worsening exertional dyspnea.  Finds it hard to walk across the parking lot and became short of breath walking on the ground floor of our office building from the entrance to the elevator.  Seems to have NYHA functional class II-III status.  He does not have lower extremity edema, orthopnea, PND and has not had chest pain, dizziness, syncope or palpitations.  His wife has made sure that he is compliant with his anticoagulant.  He has not had any falls or bleeding problems.  He has not had any new focal neurological events.  He does have a history of sleep apnea and has not used CPAP in many years, since he considered cured after he lost a lot of weight.  He has never been able to really use CPAP for more than a few weeks.  He feels tired all the time.  Although he was diagnosed with sleep apnea, he has not used CPAP in years.  After losing weight, he felt that this was cured.  He was prescribed CPAP twice before and on each occasion only was able to use it for couple of months before abandoning therapy. He has not been evaluated for a dental device.  He is missing a few teeth.  A fewyears ago he had a pleural effusion following trauma.  Past Medical History:  Diagnosis Date    Arthritis    Asthma    Atrial fibrillation (HCC) 01/12/2010   2D Echo EF=>55%   Bipolar disorder (HCC)    Chronic back pain    Depression    Dysrhythmia    AFib   Elevated PSA    GERD (gastroesophageal reflux disease)    History of colonic polyps    History of gout    Hyperlipidemia    Hypertension    Hypothyroidism    Morbid obesity (HCC)    OSA (obstructive sleep apnea)    AHI was 27.62hr RDI was 34.3 hr REM 0.00hr; had CPAP years ago but no longer uses.   Prostatitis    Tubular adenoma of colon 01/2016   Past Surgical History:  Procedure Laterality Date   APPENDECTOMY  1959   asthma     CATARACT EXTRACTION Bilateral 1995   COLONOSCOPY  2011   RMR: left-sided diverticula, minimal rectal friability. No polyps. Repeat 5 years.   COLONOSCOPY WITH PROPOFOL  N/A 01/15/2016   Dr.Rourk- diverticulosis in the entire examined colon, multiple rectal and colonic polyps, internal hemorrhoids. bx= tubular adenomas and hyperplastic polyps.    COLONOSCOPY WITH PROPOFOL  N/A 03/13/2022   Procedure: COLONOSCOPY WITH PROPOFOL ;  Surgeon: [name], MD;  [facility];  Service: Endoscopy;  Laterality: N/A;  11:00am   HAND / FINGER LESION EXCISION Right    IR THORACENTESIS RIGHT ASP PLEURAL SPACE W/IMG GUIDE  03/01/2019   IR THORACENTESIS RIGHT ASP PLEURAL SPACE W/IMG GUIDE  03/31/2019   POLYPECTOMY  01/15/2016   Procedure: POLYPECTOMY;  Surgeon: Lamar CHRISTELLA Hollingshead, MD;  [facility]: [facility];  Service: Endoscopy;;   POLYPECTOMY  03/13/2022   Procedure: POLYPECTOMY;  Surgeon: Hollingshead Lamar CHRISTELLA, MD;  [facility]: [facility];  Service: Endoscopy;;   PROSTATE BIOPSY     right shoulder surgery     TONSILLECTOMY  1955     Current Meds  Medication Sig   acetaminophen  (TYLENOL ) 500 MG tablet Take 1,000 mg by mouth every 6 (six) hours as needed for headache.   albuterol  (VENTOLIN  HFA) 108 (90 Base) MCG/ACT inhaler Inhale 2 puffs into the lungs every 6 (six) hours as needed (Asthma).    levothyroxine  (SYNTHROID , LEVOTHROID) 25 MCG tablet Take 25 mcg by mouth daily before breakfast.   lithium  carbonate 150 MG capsule Take 3 capsules (450 mg total) by mouth at bedtime.   metoprolol  succinate (TOPROL -XL) 25 MG 24 hr tablet Take 1 tablet by mouth once daily   pantoprazole  (PROTONIX ) 40 MG tablet TAKE ONE TABLET BY MOUTH ONCE DAILY   rivaroxaban  (XARELTO ) 20 MG TABS tablet Take 20 mg by mouth daily after supper.    rosuvastatin  (CRESTOR ) 20 MG tablet Take 1 tablet (20 mg total) by mouth daily.   sertraline  (ZOLOFT ) 50 MG tablet Take 1 tablet (50 mg total) by mouth daily.   SUPER B COMPLEX/C PO Take 1 tablet by mouth daily.     Allergies:   Amlodipine and Vicodin [hydrocodone -acetaminophen ]   Social History   Tobacco Use   Smoking status: Never   Smokeless tobacco: Never  Vaping Use   Vaping status: Never Used  Substance Use Topics   Alcohol use: No    Alcohol/week: 0.0 standard drinks of alcohol   Drug use: No     Family Hx: The patient's family history includes Arthritis in his brother; Cancer in his paternal grandmother; Diabetes in his mother; Heart failure in his mother; Hypertension in his father and mother; Liver disease in his brother; Stroke in his sister; Vascular Disease in his brother. There is no history of Colon cancer.  ROS:   Please see the history of present illness.     All other systems reviewed and are negative.   Studies Reviewed: SABRA     EKG Interpretation Date/Time:  Wednesday July 21 2024 16:21:20 EST Ventricular Rate:  71 PR Interval:    QRS Duration:  80 QT Interval:  400 QTC Calculation: 434 R Axis:   -5  Text Interpretation: Atrial fibrillation When compared with ECG of 22-Aug-2023 09:14, QT has shortened Confirmed by Mahala Rommel [phone]) on 07/21/2024 4:29:06 PM        Risk Assessment/Calculations:     CHA2DS2-VASc Score = 5   This indicates a 7.2% annual risk of stroke. The patient's score is based upon: CHF  History: 1 HTN History: 0 Diabetes History: 0 Stroke History: 2 Vascular Disease History: 0 Age Score: 2 Gender Score: 0     Recent Labs: 08/22/2023: Hemoglobin 13.6; Platelets 181 02/25/2024: BUN 17; Creatinine, Ser 1.23; Potassium 3.8; Sodium 142  06/27/2022 Creatinine 1.32, potassium 4.4, TSH 1.4, hemoglobin 13.7  Recent Lipid Panel Lab Results  Component Value Date/Time   CHOL 163 11/25/2022 07:47 AM   TRIG 73 11/25/2022 07:47 AM   HDL 48 11/25/2022 07:47 AM   CHOLHDL 3.4 11/25/2022 07:47 AM   LDLCALC 100 (H) 11/25/2022 07:47 AM    01/28/2019 Total  cholesterol 183, HDL 60, LDL 111, triglycerides 58  11/25/2022 Cholesterol 163, HDL 48, LDL 100, triglycerides 73 Hemoglobin A1c 5.2% Hemoglobin 13.5, creatinine 1.23, potassium 3.8, ALT 13  Wt Readings from Last 3 Encounters:  07/21/24 213 lb 12.8 oz (97 kg)  03/17/24 192 lb 9.6 oz (87.4 kg)  03/07/24 200 lb (90.7 kg)     Objective:    Vital Signs:  BP 104/68 (BP Location: Left Arm, Patient Position: Sitting, Cuff Size: Normal)   Pulse 71   Ht 6' (1.829 m)   Wt 213 lb 12.8 oz (97 kg)   SpO2 93%   BMI 29.00 kg/m      General: Alert, oriented x3, no distress, overweight Head: no evidence of trauma, PERRL, EOMI, no exophtalmos or lid lag, no myxedema, no xanthelasma; normal ears, nose and oropharynx Neck: normal jugular venous pulsations and no hepatojugular reflux; brisk carotid pulses without delay and no carotid bruits Chest: clear to auscultation, no signs of consolidation by percussion or palpation, normal fremitus, symmetrical and full respiratory excursions Cardiovascular: normal position and quality of the apical impulse, irregular rhythm, normal first and second heart sounds, no murmurs, rubs or gallops Abdomen: no tenderness or distention, no masses by palpation, no abnormal pulsatility or arterial bruits, normal bowel sounds, no hepatosplenomegaly Extremities: no clubbing, cyanosis or edema; 2+ radial,  ulnar and brachial pulses bilaterally; 2+ right femoral, posterior tibial and dorsalis pedis pulses; 2+ left femoral, posterior tibial and dorsalis pedis pulses; no subclavian or femoral bruits Neurological: grossly nonfocal Psych: Normal mood and affect    ASSESSMENT & PLAN:    1. SOB (shortness of breath)   2. Permanent atrial fibrillation (HCC)   3. Cerebrovascular accident (CVA), unspecified mechanism (HCC)   4. Hypercoagulable state due to permanent atrial fibrillation (HCC)   5. OSA (obstructive sleep apnea)   6. Bipolar affective disorder, remission status unspecified (HCC)   7. Hypercholesterolemia        Exertional dyspnea: I am concerned about development of congestive heart failure.  Check an echocardiogram.  At least at the moment, there is no overt evidence of hypervolemia so I did not prescribe diuretics or SGLT2 inhibitors yet.  Will also check an event monitor to make sure that his atrial fibrillation is truly well rate controlled with activity. AFib: He is well rate controlled and appropriately anticoagulated.   Xarelto : He was scheduled for Watchman procedure but backed out at the last minute due to anxiety.  No serious bleeding complications.  He had a stroke during noncompliance with the medication, this was not true failure of the Xarelto .  Since this may become a problem again in the future, I still think a watchman was probably the right solution for him. OSA: Untreated for many years, may be part of the problem is shortness of breath if he has developed pulmonary hypertension. Bipolar disorder: Seems to be doing well on lithium .  No sign of toxicity by ECG. Overweight:  he tends to be leaner when he is in a period of depression.   Hypercholesterolemia: All parameters in acceptable range.  He does not have known CAD or PAD. HTN: Probably not an accurate diagnosis.  His blood pressure fluctuates somewhat with his weight and mood problems.  He is only taking a low-dose  of metoprolol  as an antihypertensive and this is actually prescribed for atrial fibrillation rate control  Jerel Balding, MD, [facility]  [phone] office [phone] pager  Jerel Balding, MD   "

## 2024-07-26 NOTE — Progress Notes (Unsigned)
 "  Assessment: 1.  Grade group 2 prostate cancer, status post radiotherapy in early November 2025, overall doing well with decrease in PSA from 11.4 pretreatment to 3.973 weeks ago  2.  Microscopic hematuria  3.  LUTS following IMRT, does not want treatment at this point   Plan: 1.  Patient reassured about his PSA  2.  Overactive bladder guide sheet given  3.  Office visit to see Dr. Sherrilee in about 4 months following PSA.  Will need to recheck urine at that time   HPI:   1.7.2025: Initial visit for elevated PSA, recently 6.5 on 12.18.2024. Prior PSA data:  PSA on 06/28/2022 was 3.9 PSA on 04/05/2020 was 3.9  He has been seen previously by Dr. Norleen Seltzer, the last in 2020.  He has a history of recurrent urinary tract infections as well as meatal stenosis.  The patient apparently self dilates once in a while.  He currently denies significant lower urinary tract symptoms.  Apparently, he was treated for what sounds like epididymitis 2 to 3 months ago at an urgent care center here in [place].  Currently denies gross hematuria or dysuria.  He denies history of elevated PSA.  Last PSA data I have from him when he saw Dr. Seltzer was 2.1 in 2020.  His brother does have a history of prostate cancer.  Repeat PSA on 3.6.2025: 8.8.  At that visit he was also noted to have greater than 30 red cells per microscopic high-power field on urinalysis as well as abnormal number of white cells.  Urine culture grew Enterococcus.  He was treated with amoxicillin .  7.22.2025: TRUS/Bx. Prostate volume 29 mL, PSA 11.4, PSAD 0.39 3/12 cores revealed adenocarcinoma-  -2 cores (Rt base lateral, Rt mid lateral) w/ GG2 pattern in 80 and 20% of cores respectively)  -1 core (Lt base media) w/ GG3 pattern in 90% of core.  9.29.2025-11.6.2025: underwent 28 fractions of IMRT  1.13.2026: It has been about 2 months since he completed IMRT.  No dysuria, no gross hematuria.  He is having urgency and rare urgency  incontinence.  Despite that IPSS 2/3.  Does not want to be on any medications.  PSA checked by his PCP in late December was 3.97.  PMH: Past Medical History:  Diagnosis Date   Arthritis    Asthma    Atrial fibrillation (HCC) 01/12/2010   2D Echo EF=>55%   Bipolar disorder (HCC)    Chronic back pain    Depression    Dysrhythmia    AFib   Elevated PSA    GERD (gastroesophageal reflux disease)    History of colonic polyps    History of gout    Hyperlipidemia    Hypertension    Hypothyroidism    Morbid obesity (HCC)    OSA (obstructive sleep apnea)    AHI was 27.62hr RDI was 34.3 hr REM 0.00hr; had CPAP years ago but no longer uses.   Prostatitis    Tubular adenoma of colon 01/2016    Surgical History: Past Surgical History:  Procedure Laterality Date   APPENDECTOMY  1959   asthma     CATARACT EXTRACTION Bilateral 1995   COLONOSCOPY  2011   RMR: left-sided diverticula, minimal rectal friability. No polyps. Repeat 5 years.   COLONOSCOPY WITH PROPOFOL  N/A 01/15/2016   Dr.Rourk- diverticulosis in the entire examined colon, multiple rectal and colonic polyps, internal hemorrhoids. bx= tubular adenomas and hyperplastic polyps.    COLONOSCOPY WITH PROPOFOL  N/A 03/13/2022  Procedure: COLONOSCOPY WITH PROPOFOL ;  Surgeon: [name], MD;  [facility];  Service: Endoscopy;  Laterality: N/A;  11:00am   HAND / FINGER LESION EXCISION Right    IR THORACENTESIS RIGHT ASP PLEURAL SPACE W/IMG GUIDE  03/01/2019   IR THORACENTESIS RIGHT ASP PLEURAL SPACE W/IMG GUIDE  03/31/2019   POLYPECTOMY  01/15/2016   Procedure: POLYPECTOMY;  Surgeon: Lamar HERO Shaaron, MD;  [facility];  Service: Endoscopy;;   POLYPECTOMY  03/13/2022   Procedure: POLYPECTOMY;  Surgeon: [name], MD;  [facility];  Service: Endoscopy;;   PROSTATE BIOPSY     right shoulder surgery     TONSILLECTOMY  1955    Home Medications:  Allergies as of 07/27/2024       Reactions    Amlodipine Other (See Comments)   Stopped due to kidney issues   Vicodin [hydrocodone -acetaminophen ] Other (See Comments)   Patient is unsure of reaction        Medication List        Accurate as of July 26, 2024  8:05 AM. If you have any questions, ask your nurse or doctor.          acetaminophen  500 MG tablet Commonly known as: TYLENOL  Take 1,000 mg by mouth every 6 (six) hours as needed for headache.   albuterol  108 (90 Base) MCG/ACT inhaler Commonly known as: VENTOLIN  HFA Inhale 2 puffs into the lungs every 6 (six) hours as needed (Asthma).   levothyroxine  25 MCG tablet Commonly known as: SYNTHROID  Take 25 mcg by mouth daily before breakfast.   lithium  carbonate 150 MG capsule Take 3 capsules (450 mg total) by mouth at bedtime.   metoprolol  succinate 25 MG 24 hr tablet Commonly known as: TOPROL -XL Take 1 tablet by mouth once daily   pantoprazole  40 MG tablet Commonly known as: PROTONIX  TAKE ONE TABLET BY MOUTH ONCE DAILY   rivaroxaban  20 MG Tabs tablet Commonly known as: XARELTO  Take 20 mg by mouth daily after supper.   rosuvastatin  20 MG tablet Commonly known as: CRESTOR  Take 1 tablet (20 mg total) by mouth daily.   sertraline  50 MG tablet Commonly known as: ZOLOFT  Take 1 tablet (50 mg total) by mouth daily.   SUPER B COMPLEX/C PO Take 1 tablet by mouth daily.   Vitamin D3 250 MCG (10000 UT) capsule Take 10,000 Units by mouth daily.        Allergies:  Allergies  Allergen Reactions   Amlodipine Other (See Comments)    Stopped due to kidney issues   Vicodin [Hydrocodone -Acetaminophen ] Other (See Comments)    Patient is unsure of reaction    Family History: Family History  Problem Relation Age of Onset   Diabetes Mother    Heart failure Mother    Hypertension Mother    Hypertension Father    Cancer Paternal Grandmother    Stroke Sister    Liver disease Brother    Vascular Disease Brother    Arthritis Brother    Colon cancer Neg  Hx     Social History:  reports that he has never smoked. He has never used smokeless tobacco. He reports that he does not drink alcohol and does not use drugs.  ROS: All other review of systems were reviewed and are negative except what is noted above in HPI  Physical Exam: There were no vitals taken for this visit.  Constitutional:  Alert and oriented, No acute distress. HEENT:  AT, moist mucus membranes.  Trachea midline, no masses. Cardiovascular: No clubbing, cyanosis, or edema. Respiratory: Normal respiratory effort, no increased work of breathing. Skin: No rashes, bruises or suspicious lesions. Neurologic: Grossly intact, no focal deficits, moving all 4 extremities. Psychiatric: Normal mood and affect.  Laboratory Data: Lab Results  Component Value Date   WBC 7.7 08/22/2023   HGB 13.6 08/22/2023   HCT 41.6 08/22/2023   MCV 98 (H) 08/22/2023   PLT 181 08/22/2023    Lab Results  Component Value Date   CREATININE 1.23 02/25/2024    No results found for: PSA  No results found for: TESTOSTERONE  Lab Results  Component Value Date   HGBA1C 5.2 11/24/2022    Urinalysis    Component Value Date/Time   COLORURINE YELLOW 11/24/2022 1535   APPEARANCEUR Clear 01/13/2024 1529   LABSPEC 1.009 11/24/2022 1535   PHURINE 5.0 11/24/2022 1535   GLUCOSEU Negative 01/13/2024 1529   HGBUR NEGATIVE 11/24/2022 1535   BILIRUBINUR Negative 01/13/2024 1529   KETONESUR NEGATIVE 11/24/2022 1535   PROTEINUR 1+ (A) 01/13/2024 1529   PROTEINUR NEGATIVE 11/24/2022 1535   NITRITE Negative 01/13/2024 1529   NITRITE NEGATIVE 11/24/2022 1535   LEUKOCYTESUR Trace (A) 01/13/2024 1529   LEUKOCYTESUR LARGE (A) 11/24/2022 1535    Lab Results  Component Value Date   LABMICR See below: 01/13/2024   WBCUA 6-10 (A) 01/13/2024   LABEPIT 0-10 01/13/2024   MUCUS Present (A) 07/22/2023   BACTERIA Few (A) 01/13/2024   Prior each ear notes reviewed  Urinalysis reviewed  IPSS  reviewed  PSA/pathology data reviewed         "

## 2024-07-27 ENCOUNTER — Other Ambulatory Visit: Payer: Self-pay

## 2024-07-27 ENCOUNTER — Observation Stay (HOSPITAL_COMMUNITY)
Admission: EM | Admit: 2024-07-27 | Discharge: 2024-07-29 | Disposition: A | Source: Home / Self Care | Attending: Emergency Medicine | Admitting: Emergency Medicine

## 2024-07-27 ENCOUNTER — Emergency Department (HOSPITAL_COMMUNITY)

## 2024-07-27 ENCOUNTER — Ambulatory Visit: Admitting: Urology

## 2024-07-27 ENCOUNTER — Encounter (HOSPITAL_COMMUNITY): Payer: Self-pay

## 2024-07-27 VITALS — BP 100/60 | HR 91

## 2024-07-27 DIAGNOSIS — R918 Other nonspecific abnormal finding of lung field: Secondary | ICD-10-CM | POA: Insufficient documentation

## 2024-07-27 DIAGNOSIS — R9341 Abnormal radiologic findings on diagnostic imaging of renal pelvis, ureter, or bladder: Secondary | ICD-10-CM | POA: Insufficient documentation

## 2024-07-27 DIAGNOSIS — N179 Acute kidney failure, unspecified: Secondary | ICD-10-CM | POA: Insufficient documentation

## 2024-07-27 DIAGNOSIS — F3132 Bipolar disorder, current episode depressed, moderate: Secondary | ICD-10-CM

## 2024-07-27 DIAGNOSIS — N189 Chronic kidney disease, unspecified: Secondary | ICD-10-CM | POA: Diagnosis not present

## 2024-07-27 DIAGNOSIS — E782 Mixed hyperlipidemia: Secondary | ICD-10-CM | POA: Diagnosis not present

## 2024-07-27 DIAGNOSIS — S32000A Wedge compression fracture of unspecified lumbar vertebra, initial encounter for closed fracture: Secondary | ICD-10-CM | POA: Diagnosis present

## 2024-07-27 DIAGNOSIS — R399 Unspecified symptoms and signs involving the genitourinary system: Secondary | ICD-10-CM | POA: Diagnosis not present

## 2024-07-27 DIAGNOSIS — I482 Chronic atrial fibrillation, unspecified: Secondary | ICD-10-CM | POA: Insufficient documentation

## 2024-07-27 DIAGNOSIS — Z79899 Other long term (current) drug therapy: Secondary | ICD-10-CM | POA: Diagnosis not present

## 2024-07-27 DIAGNOSIS — I129 Hypertensive chronic kidney disease with stage 1 through stage 4 chronic kidney disease, or unspecified chronic kidney disease: Secondary | ICD-10-CM | POA: Insufficient documentation

## 2024-07-27 DIAGNOSIS — K683 Retroperitoneal hematoma: Secondary | ICD-10-CM | POA: Diagnosis not present

## 2024-07-27 DIAGNOSIS — R410 Disorientation, unspecified: Secondary | ICD-10-CM | POA: Diagnosis not present

## 2024-07-27 DIAGNOSIS — W102XXA Fall (on)(from) incline, initial encounter: Secondary | ICD-10-CM | POA: Diagnosis not present

## 2024-07-27 DIAGNOSIS — G4733 Obstructive sleep apnea (adult) (pediatric): Secondary | ICD-10-CM | POA: Diagnosis not present

## 2024-07-27 DIAGNOSIS — R3129 Other microscopic hematuria: Secondary | ICD-10-CM

## 2024-07-27 DIAGNOSIS — Y92009 Unspecified place in unspecified non-institutional (private) residence as the place of occurrence of the external cause: Secondary | ICD-10-CM | POA: Diagnosis not present

## 2024-07-27 DIAGNOSIS — K219 Gastro-esophageal reflux disease without esophagitis: Secondary | ICD-10-CM | POA: Diagnosis not present

## 2024-07-27 DIAGNOSIS — S0101XA Laceration without foreign body of scalp, initial encounter: Secondary | ICD-10-CM | POA: Insufficient documentation

## 2024-07-27 DIAGNOSIS — E039 Hypothyroidism, unspecified: Secondary | ICD-10-CM | POA: Insufficient documentation

## 2024-07-27 DIAGNOSIS — S32030A Wedge compression fracture of third lumbar vertebra, initial encounter for closed fracture: Principal | ICD-10-CM | POA: Insufficient documentation

## 2024-07-27 DIAGNOSIS — F410 Panic disorder [episodic paroxysmal anxiety] without agoraphobia: Secondary | ICD-10-CM

## 2024-07-27 DIAGNOSIS — J45909 Unspecified asthma, uncomplicated: Secondary | ICD-10-CM | POA: Insufficient documentation

## 2024-07-27 DIAGNOSIS — S32009A Unspecified fracture of unspecified lumbar vertebra, initial encounter for closed fracture: Principal | ICD-10-CM

## 2024-07-27 DIAGNOSIS — F411 Generalized anxiety disorder: Secondary | ICD-10-CM

## 2024-07-27 DIAGNOSIS — C61 Malignant neoplasm of prostate: Secondary | ICD-10-CM

## 2024-07-27 DIAGNOSIS — M545 Low back pain, unspecified: Secondary | ICD-10-CM | POA: Diagnosis present

## 2024-07-27 LAB — COMPREHENSIVE METABOLIC PANEL WITH GFR
ALT: 12 U/L (ref 0–44)
AST: 26 U/L (ref 15–41)
Albumin: 4.4 g/dL (ref 3.5–5.0)
Alkaline Phosphatase: 115 U/L (ref 38–126)
Anion gap: 16 — ABNORMAL HIGH (ref 5–15)
BUN: 24 mg/dL — ABNORMAL HIGH (ref 8–23)
CO2: 21 mmol/L — ABNORMAL LOW (ref 22–32)
Calcium: 9.4 mg/dL (ref 8.9–10.3)
Chloride: 103 mmol/L (ref 98–111)
Creatinine, Ser: 1.45 mg/dL — ABNORMAL HIGH (ref 0.61–1.24)
GFR, Estimated: 50 mL/min — ABNORMAL LOW
Glucose, Bld: 227 mg/dL — ABNORMAL HIGH (ref 70–99)
Potassium: 4.5 mmol/L (ref 3.5–5.1)
Sodium: 140 mmol/L (ref 135–145)
Total Bilirubin: 0.8 mg/dL (ref 0.0–1.2)
Total Protein: 7 g/dL (ref 6.5–8.1)

## 2024-07-27 LAB — URINALYSIS, ROUTINE W REFLEX MICROSCOPIC
Bilirubin, UA: NEGATIVE
Glucose, UA: NEGATIVE
Ketones, UA: NEGATIVE
Nitrite, UA: NEGATIVE
Protein,UA: NEGATIVE
Specific Gravity, UA: 1.02 (ref 1.005–1.030)
Urobilinogen, Ur: 1 mg/dL (ref 0.2–1.0)
pH, UA: 6 (ref 5.0–7.5)

## 2024-07-27 LAB — CBG MONITORING, ED: Glucose-Capillary: 217 mg/dL — ABNORMAL HIGH (ref 70–99)

## 2024-07-27 LAB — CBC
HCT: 41.2 % (ref 39.0–52.0)
Hemoglobin: 13.6 g/dL (ref 13.0–17.0)
MCH: 32.2 pg (ref 26.0–34.0)
MCHC: 33 g/dL (ref 30.0–36.0)
MCV: 97.6 fL (ref 80.0–100.0)
Platelets: 186 K/uL (ref 150–400)
RBC: 4.22 MIL/uL (ref 4.22–5.81)
RDW: 14.6 % (ref 11.5–15.5)
WBC: 6.9 K/uL (ref 4.0–10.5)
nRBC: 0 % (ref 0.0–0.2)

## 2024-07-27 LAB — MICROSCOPIC EXAMINATION

## 2024-07-27 LAB — I-STAT CHEM 8, ED
BUN: 26 mg/dL — ABNORMAL HIGH (ref 8–23)
Calcium, Ion: 1.25 mmol/L (ref 1.15–1.40)
Chloride: 103 mmol/L (ref 98–111)
Creatinine, Ser: 1.5 mg/dL — ABNORMAL HIGH (ref 0.61–1.24)
Glucose, Bld: 213 mg/dL — ABNORMAL HIGH (ref 70–99)
HCT: 43 % (ref 39.0–52.0)
Hemoglobin: 14.6 g/dL (ref 13.0–17.0)
Potassium: 4.5 mmol/L (ref 3.5–5.1)
Sodium: 141 mmol/L (ref 135–145)
TCO2: 26 mmol/L (ref 22–32)

## 2024-07-27 MED ORDER — HYDROMORPHONE HCL 1 MG/ML IJ SOLN
0.5000 mg | Freq: Once | INTRAMUSCULAR | Status: AC
  Administered 2024-07-27: 0.5 mg via INTRAVENOUS
  Filled 2024-07-27: qty 0.5

## 2024-07-27 MED ORDER — IOHEXOL 350 MG/ML SOLN
100.0000 mL | Freq: Once | INTRAVENOUS | Status: AC | PRN
  Administered 2024-07-27: 100 mL via INTRAVENOUS

## 2024-07-27 MED ORDER — SODIUM CHLORIDE 0.9 % IV BOLUS
1000.0000 mL | Freq: Once | INTRAVENOUS | Status: AC
  Administered 2024-07-27: 1000 mL via INTRAVENOUS

## 2024-07-27 MED ORDER — FENTANYL CITRATE (PF) 100 MCG/2ML IJ SOLN
50.0000 ug | Freq: Once | INTRAMUSCULAR | Status: AC
  Administered 2024-07-27: 50 ug via INTRAVENOUS
  Filled 2024-07-27: qty 2

## 2024-07-27 MED ORDER — LIDOCAINE HCL (PF) 1 % IJ SOLN
5.0000 mL | Freq: Once | INTRAMUSCULAR | Status: AC
  Administered 2024-07-27: 5 mL
  Filled 2024-07-27: qty 5

## 2024-07-27 MED ORDER — TETANUS-DIPHTH-ACELL PERTUSSIS 5-2-15.5 LF-MCG/0.5 IM SUSP
0.5000 mL | Freq: Once | INTRAMUSCULAR | Status: AC
  Administered 2024-07-27: 0.5 mL via INTRAMUSCULAR
  Filled 2024-07-27: qty 0.5

## 2024-07-27 NOTE — ED Notes (Signed)
 Upon arrival pt was off monitor after being brought back from MRI- Pt placed back on monitor prior to be transported to CT for another CT scan

## 2024-07-27 NOTE — ED Notes (Signed)
Ortho at bedside to apply brace 

## 2024-07-27 NOTE — ED Notes (Signed)
 Wound to patient's head irrigated and fully assessed. Laceration ~4-5cm in length. Depth appears to be subdermal, but unable to be measured. Bleeding is under control. No debris present.

## 2024-07-27 NOTE — ED Provider Notes (Signed)
 " Casey [name] Provider Note  CSN: [id number] Arrival date & time: 07/27/24 1435  Chief Complaint(s) Fall  HPI Casey [name] is a 79 y.o. male history of atrial fibrillation, bipolar disorder, obesity,, anxiety on anticoagulation reports he had a fall.  He reports that he had a panic attack which happens to him frequently that resulted in him falling over onto his handicap ramp.  Reports some pain to the left elbow, head, neck, mid back, low back.  Does take a blood thinner.  No loss of consciousness, reports prior to the episode he felt very anxious typical of his normal panic attack.  Does report increased number of recent falls.  No palpitations, chest pain, abdominal pain.  Has been able to ambulate after the accident..   Past Medical History Past Medical History:  Diagnosis Date   Arthritis    Asthma    Atrial fibrillation (HCC) 01/12/2010   2D Echo EF=>55%   Bipolar disorder (HCC)    Chronic back pain    Depression    Dysrhythmia    AFib   Elevated PSA    GERD (gastroesophageal reflux disease)    History of colonic polyps    History of gout    Hyperlipidemia    Hypertension    Hypothyroidism    Morbid obesity (HCC)    OSA (obstructive sleep apnea)    AHI was 27.62hr RDI was 34.3 hr REM 0.00hr; had CPAP years ago but no longer uses.   Prostatitis    Tubular adenoma of colon 01/2016   Patient Active Problem List   Diagnosis Date Noted   Malignant neoplasm of prostate (HCC) 03/17/2024   Aortic calcification 03/17/2024   Pleural effusion on left 10/08/2023   DOE (dyspnea on exertion) 10/07/2023   Acute CVA (cerebrovascular accident) (HCC) 11/24/2022   Pain in left shoulder 02/21/2021   Constipation 12/28/2018   N&V (nausea and vomiting) 12/28/2018   GERD (gastroesophageal reflux disease) 12/28/2018   Abdominal pain 12/28/2018   Weight loss, unintentional 12/28/2018   Atrial fibrillation with RVR (HCC)    Sepsis (HCC)  06/13/2018   Acute metabolic encephalopathy 06/13/2018   Thrombocytopenia 06/13/2018   Renal insufficiency 06/13/2018   Anxiety 06/12/2018   Bipolar disorder (HCC) 06/12/2018   Chronic anticoagulation 05/05/2017   Sleep apnea 05/05/2017   Edema leg 05/05/2017   Dysphasia 02/07/2017   Mild obesity 05/10/2016   History of adenomatous polyp of colon    Diverticulosis of colon without hemorrhage    Hyperlipidemia LDL goal <130 05/16/2013   KNEE PAIN 08/07/2009   KNEE SPRAIN, ACUTE 08/07/2009   History of colonic polyps 05/10/2009   Hypothyroidism 05/08/2009   History of bipolar disorder 05/08/2009   Essential hypertension 05/08/2009   Permanent atrial fibrillation 05/08/2009   HEMATOCHEZIA 05/08/2009   LOW BACK PAIN, CHRONIC 05/08/2009   ABDOMINAL PAIN 05/08/2009   Home Medication(s) Prior to Admission medications  Medication Sig Start Date End Date Taking? Authorizing Provider  acetaminophen  (TYLENOL ) 500 MG tablet Take 1,000 mg by mouth every 6 (six) hours as needed for headache.    [provider]  albuterol  (VENTOLIN  HFA) 108 (90 Base) MCG/ACT inhaler Inhale 2 puffs into the lungs every 6 (six) hours as needed (Asthma).    [provider]  Cholecalciferol (VITAMIN D3) 250 MCG (10000 UT) capsule Take 10,000 Units by mouth daily.    [provider]  levothyroxine  (SYNTHROID , LEVOTHROID) 25 MCG tablet Take 25 mcg by mouth daily  before breakfast. 04/06/15   [provider]  lithium  carbonate 150 MG capsule Take 3 capsules (450 mg total) by mouth at bedtime. 05/26/24   Cottle, Lorene KANDICE Raddle., MD  metoprolol  succinate (TOPROL -XL) 25 MG 24 hr tablet Take 1 tablet by mouth once daily 08/13/23   Croitoru, Mihai, MD  pantoprazole  (PROTONIX ) 40 MG tablet TAKE ONE TABLET BY MOUTH ONCE DAILY 03/07/17   Marvis Camellia LABOR, NP  rivaroxaban  (XARELTO ) 20 MG TABS tablet Take 20 mg by mouth daily after supper.     [provider]  rosuvastatin  (CRESTOR ) 20 MG tablet  Take 1 tablet (20 mg total) by mouth daily. 11/26/22   Singh, Prashant K, MD  sertraline  (ZOLOFT ) 50 MG tablet Take 1 tablet (50 mg total) by mouth daily. 05/26/24   Cottle, Lorene KANDICE Raddle., MD  SUPER B COMPLEX/C PO Take 1 tablet by mouth daily.    [provider]                                                                                                                                    Past Surgical History Past Surgical History:  Procedure Laterality Date   APPENDECTOMY  1959   asthma     CATARACT EXTRACTION Bilateral 1995   COLONOSCOPY  2011   RMR: left-sided diverticula, minimal rectal friability. No polyps. Repeat 5 years.   COLONOSCOPY WITH PROPOFOL  N/A 01/15/2016   Dr.Rourk- diverticulosis in the entire examined colon, multiple rectal and colonic polyps, internal hemorrhoids. bx= tubular adenomas and hyperplastic polyps.    COLONOSCOPY WITH PROPOFOL  N/A 03/13/2022   Procedure: COLONOSCOPY WITH PROPOFOL ;  Surgeon: [name], MD;  Location: [facility];  Service: Endoscopy;  Laterality: N/A;  11:00am   HAND / FINGER LESION EXCISION Right    IR THORACENTESIS RIGHT ASP PLEURAL SPACE W/IMG GUIDE  03/01/2019   IR THORACENTESIS RIGHT ASP PLEURAL SPACE W/IMG GUIDE  03/31/2019   POLYPECTOMY  01/15/2016   Procedure: POLYPECTOMY;  Surgeon: Lamar HERO Shaaron, MD;  Location: [facility];  Service: Endoscopy;;   POLYPECTOMY  03/13/2022   Procedure: POLYPECTOMY;  Surgeon: [name], MD;  Location: [facility];  Service: Endoscopy;;   PROSTATE BIOPSY     right shoulder surgery     TONSILLECTOMY  1955   Family History Family History  Problem Relation Age of Onset   Diabetes Mother    Heart failure Mother    Hypertension Mother    Hypertension Father    Cancer Paternal Grandmother    Stroke Sister    Liver disease Brother    Vascular Disease Brother    Arthritis Brother    Colon cancer Neg Hx     Social History Social History[1] Allergies Amlodipine and  Vicodin [hydrocodone -acetaminophen ]  Review of Systems Review of Systems  All other systems reviewed and are negative.   Physical Exam Vital Signs  I have reviewed the  triage vital signs BP (!) 123/51   Pulse 89   Temp 97.7 F (36.5 C) (Temporal)   Resp 17   Ht 6' (1.829 m)   Wt 97 kg   SpO2 99%   BMI 29.00 kg/m  Physical Exam Vitals and nursing note reviewed.  Constitutional:      General: He is not in acute distress.    Appearance: Normal appearance.  HENT:     Head: Normocephalic.     Comments: Abrasion to forehead, approximately 3 cm laceration to superior scalp    Mouth/Throat:     Mouth: Mucous membranes are moist.  Eyes:     Conjunctiva/sclera: Conjunctivae normal.  Cardiovascular:     Rate and Rhythm: Normal rate and regular rhythm.  Pulmonary:     Effort: Pulmonary effort is normal. No respiratory distress.     Breath sounds: Normal breath sounds.  Abdominal:     General: Abdomen is flat.     Palpations: Abdomen is soft.     Tenderness: There is no abdominal tenderness.  Musculoskeletal:     Right lower leg: No edema.     Left lower leg: No edema.     Comments: No midline C, T, L-spine tenderness.  No chest wall tenderness or crepitus.  Full painless range of motion at the bilateral upper extremities including the shoulders, elbows, wrists, hand and fingers.  Slightly painful range of motion of the bilateral hips but patient attributes this to low back discomfort rather than pain in the hip.  No deformity.  Otherwise knees, ankles, feet atraumatic in the lower extremities..  No focal bony tenderness, injury or deformity.   Skin:    General: Skin is warm and dry.     Capillary Refill: Capillary refill takes less than 2 seconds.     Comments: Abrasion overlying left elbow  Neurological:     Mental Status: He is alert and oriented to person, place, and time. Mental status is at baseline.  Psychiatric:        Mood and Affect: Mood normal.        Behavior:  Behavior normal.     ED Results and Treatments Labs (all labs ordered are listed, but only abnormal results are displayed) Labs Reviewed  I-STAT CHEM 8, ED - Abnormal; Notable for the following components:      Result Value   BUN 26 (*)    Creatinine, Ser 1.50 (*)    Glucose, Bld 213 (*)    All other components within normal limits  CBG MONITORING, ED - Abnormal; Notable for the following components:   Glucose-Capillary 217 (*)    All other components within normal limits  COMPREHENSIVE METABOLIC PANEL WITH GFR  CBC  ETHANOL  URINALYSIS, ROUTINE W REFLEX MICROSCOPIC  LACTIC ACID, PLASMA                                                                                                                          Radiology  No results found.  Pertinent labs & imaging results that were available during my care of the patient were reviewed by me and considered in my medical decision making (see MDM for details).  Medications Ordered in ED Medications  fentaNYL  (SUBLIMAZE ) injection 50 mcg (has no administration in time range)                                                                                                                                     Procedures Procedures  (including critical care time)  Medical Decision Making / ED Course   MDM:  79 year old presenting to the emergency department with ground-level fall.  Patient is overall well-appearing, his physical examination notable for laceration of scalp, abrasion of forehead, abrasion to left elbow.  He is otherwise well-appearing with reassuring vital signs.  Denies any syncope or loss of consciousness.  Reports that he gets panic attacks where he feels very anxious and then loses balance and falls over and this is consistent with prior episodes.  Will reassess.      Additional history obtained: -Additional history obtained from {wsadditionalhistorian:28072} -External records from outside source obtained and  reviewed including: Chart review including previous notes, labs, imaging, consultation notes including ***   Lab Tests: -I ordered, reviewed, and interpreted labs.   The pertinent results include:   Labs Reviewed  I-STAT CHEM 8, ED - Abnormal; Notable for the following components:      Result Value   BUN 26 (*)    Creatinine, Ser 1.50 (*)    Glucose, Bld 213 (*)    All other components within normal limits  CBG MONITORING, ED - Abnormal; Notable for the following components:   Glucose-Capillary 217 (*)    All other components within normal limits  COMPREHENSIVE METABOLIC PANEL WITH GFR  CBC  ETHANOL  URINALYSIS, ROUTINE W REFLEX MICROSCOPIC  LACTIC ACID, PLASMA    Notable for ***  EKG   EKG Interpretation Date/Time:    Ventricular Rate:    PR Interval:    QRS Duration:    QT Interval:    QTC Calculation:   R Axis:      Text Interpretation:           Imaging Studies ordered: I ordered imaging studies including *** On my interpretation imaging demonstrates *** I independently visualized and interpreted imaging. I agree with the radiologist interpretation   Medicines ordered and prescription drug management: Meds ordered this encounter  Medications   fentaNYL  (SUBLIMAZE ) injection 50 mcg    -I have reviewed the patients home medicines and have made adjustments as needed   Consultations Obtained: I requested consultation with the ***,  and discussed lab and imaging findings as well as pertinent plan - they recommend: ***   Cardiac Monitoring: The patient was maintained on a cardiac monitor.  I personally viewed and interpreted the cardiac monitored which showed an underlying rhythm of: ***  Social Determinants of Health:  Diagnosis or treatment significantly limited by social determinants of health: {wssoc:28071}   Reevaluation: After the interventions noted above, I reevaluated the patient and found that their symptoms have  {resolved/improved/worsened:23923::improved}  Co morbidities that complicate the patient evaluation  Past Medical History:  Diagnosis Date   Arthritis    Asthma    Atrial fibrillation (HCC) 01/12/2010   2D Echo EF=>55%   Bipolar disorder (HCC)    Chronic back pain    Depression    Dysrhythmia    AFib   Elevated PSA    GERD (gastroesophageal reflux disease)    History of colonic polyps    History of gout    Hyperlipidemia    Hypertension    Hypothyroidism    Morbid obesity (HCC)    OSA (obstructive sleep apnea)    AHI was 27.62hr RDI was 34.3 hr REM 0.00hr; had CPAP years ago but no longer uses.   Prostatitis    Tubular adenoma of colon 01/2016      Dispostion: Disposition decision including need for hospitalization was considered, and patient {wsdispo:28070::discharged from emergency department.}    Final Clinical Impression(s) / ED Diagnoses Final diagnoses:  None     This chart was dictated using voice recognition software.  Despite best efforts to proofread,  errors can occur which can change the documentation meaning.     [1]  Social History Tobacco Use   Smoking status: Never    Passive exposure: Never   Smokeless tobacco: Never  Vaping Use   Vaping status: Never Used  Substance Use Topics   Alcohol use: No    Alcohol/week: 0.0 standard drinks of alcohol   Drug use: No   "

## 2024-07-27 NOTE — ED Notes (Signed)
"  Pt in MRI at this time.  "

## 2024-07-27 NOTE — ED Notes (Signed)
 Pts wife in Triage reports the Pt is seeing his cardiologist and reports there is concern the Pts heart is the reason he is falling more frequently. Pts wife reports the Pt has fallen apprx 10-12 times in the past month.

## 2024-07-27 NOTE — ED Triage Notes (Signed)
 Pt arrived via POV from home following a fall at home. Pt reports he is on a blood thinner, denies LOC and presents with small laceration to posterior scalp and abrasion to his left elbow. Pt reports he slipped walking up his handicap ramp at home, causing his fall.

## 2024-07-27 NOTE — ED Notes (Signed)
 Pt diaphoretic and pale, sitting on side of bed with back brace in place, pt denies dizziness, Dr Francesca made aware and at bedside to evaluate pt- family present at bedside as well.

## 2024-07-27 NOTE — ED Notes (Signed)
 TLSO Paged to get Brace .

## 2024-07-28 DIAGNOSIS — N179 Acute kidney failure, unspecified: Secondary | ICD-10-CM | POA: Diagnosis not present

## 2024-07-28 DIAGNOSIS — K683 Retroperitoneal hematoma: Secondary | ICD-10-CM | POA: Diagnosis not present

## 2024-07-28 DIAGNOSIS — R918 Other nonspecific abnormal finding of lung field: Secondary | ICD-10-CM | POA: Diagnosis not present

## 2024-07-28 DIAGNOSIS — R739 Hyperglycemia, unspecified: Secondary | ICD-10-CM | POA: Diagnosis not present

## 2024-07-28 DIAGNOSIS — E039 Hypothyroidism, unspecified: Secondary | ICD-10-CM

## 2024-07-28 DIAGNOSIS — N2889 Other specified disorders of kidney and ureter: Secondary | ICD-10-CM

## 2024-07-28 DIAGNOSIS — G4733 Obstructive sleep apnea (adult) (pediatric): Secondary | ICD-10-CM | POA: Diagnosis not present

## 2024-07-28 DIAGNOSIS — S32020A Wedge compression fracture of second lumbar vertebra, initial encounter for closed fracture: Secondary | ICD-10-CM

## 2024-07-28 DIAGNOSIS — S32000A Wedge compression fracture of unspecified lumbar vertebra, initial encounter for closed fracture: Secondary | ICD-10-CM | POA: Diagnosis not present

## 2024-07-28 LAB — CBC
HCT: 37.1 % — ABNORMAL LOW (ref 39.0–52.0)
Hemoglobin: 12.4 g/dL — ABNORMAL LOW (ref 13.0–17.0)
MCH: 32.4 pg (ref 26.0–34.0)
MCHC: 33.4 g/dL (ref 30.0–36.0)
MCV: 96.9 fL (ref 80.0–100.0)
Platelets: 159 K/uL (ref 150–400)
RBC: 3.83 MIL/uL — ABNORMAL LOW (ref 4.22–5.81)
RDW: 14.8 % (ref 11.5–15.5)
WBC: 9.2 K/uL (ref 4.0–10.5)
nRBC: 0 % (ref 0.0–0.2)

## 2024-07-28 LAB — MAGNESIUM: Magnesium: 2.2 mg/dL (ref 1.7–2.4)

## 2024-07-28 LAB — COMPREHENSIVE METABOLIC PANEL WITH GFR
ALT: 11 U/L (ref 0–44)
AST: 20 U/L (ref 15–41)
Albumin: 3.8 g/dL (ref 3.5–5.0)
Alkaline Phosphatase: 103 U/L (ref 38–126)
Anion gap: 11 (ref 5–15)
BUN: 22 mg/dL (ref 8–23)
CO2: 23 mmol/L (ref 22–32)
Calcium: 9 mg/dL (ref 8.9–10.3)
Chloride: 105 mmol/L (ref 98–111)
Creatinine, Ser: 1.19 mg/dL (ref 0.61–1.24)
GFR, Estimated: >60 mL/min
Glucose, Bld: 148 mg/dL — ABNORMAL HIGH (ref 70–99)
Potassium: 4.5 mmol/L (ref 3.5–5.1)
Sodium: 139 mmol/L (ref 135–145)
Total Bilirubin: 1.1 mg/dL (ref 0.0–1.2)
Total Protein: 6.1 g/dL — ABNORMAL LOW (ref 6.5–8.1)

## 2024-07-28 LAB — PHOSPHORUS: Phosphorus: 3.4 mg/dL (ref 2.5–4.6)

## 2024-07-28 LAB — GLUCOSE, CAPILLARY
Glucose-Capillary: 149 mg/dL — ABNORMAL HIGH (ref 70–99)
Glucose-Capillary: 134 mg/dL — ABNORMAL HIGH (ref 70–99)
Glucose-Capillary: 142 mg/dL — ABNORMAL HIGH (ref 70–99)
Glucose-Capillary: 148 mg/dL — ABNORMAL HIGH (ref 70–99)
Glucose-Capillary: 172 mg/dL — ABNORMAL HIGH (ref 70–99)
Glucose-Capillary: 149 mg/dL — ABNORMAL HIGH (ref 70–99)
Glucose-Capillary: 118 mg/dL — ABNORMAL HIGH (ref 70–99)

## 2024-07-28 LAB — HEMOGLOBIN A1C
Hgb A1c MFr Bld: 5.7 % — ABNORMAL HIGH (ref 4.8–5.6)
Mean Plasma Glucose: 116.89 mg/dL

## 2024-07-28 MED ORDER — LITHIUM CARBONATE 150 MG PO CAPS
450.0000 mg | ORAL_CAPSULE | Freq: Every day | ORAL | Status: DC
  Administered 2024-07-28: 450 mg via ORAL
  Filled 2024-07-28: qty 3

## 2024-07-28 MED ORDER — ONDANSETRON HCL 4 MG PO TABS: 4.0000 mg | ORAL_TABLET | Freq: Four times a day (QID) | ORAL | Status: DC | PRN

## 2024-07-28 MED ORDER — ONDANSETRON HCL 4 MG/2ML IJ SOLN: 4.0000 mg | Freq: Four times a day (QID) | INTRAMUSCULAR | Status: DC | PRN

## 2024-07-28 MED ORDER — PANTOPRAZOLE SODIUM 40 MG PO TBEC
40.0000 mg | DELAYED_RELEASE_TABLET | Freq: Every day | ORAL | Status: DC
  Administered 2024-07-28 – 2024-07-29 (×2): 40 mg via ORAL
  Filled 2024-07-28 (×2): qty 1

## 2024-07-28 MED ORDER — METOPROLOL SUCCINATE ER 25 MG PO TB24
25.0000 mg | ORAL_TABLET | Freq: Every day | ORAL | Status: DC
  Administered 2024-07-28 – 2024-07-29 (×2): 25 mg via ORAL
  Filled 2024-07-28 (×2): qty 1

## 2024-07-28 MED ORDER — HYDROMORPHONE HCL 1 MG/ML IJ SOLN
0.5000 mg | INTRAMUSCULAR | Status: DC | PRN
  Administered 2024-07-28 – 2024-07-29 (×2): 0.5 mg via INTRAVENOUS
  Filled 2024-07-28 (×2): qty 0.5

## 2024-07-28 MED ORDER — SERTRALINE HCL 50 MG PO TABS
50.0000 mg | ORAL_TABLET | Freq: Every day | ORAL | Status: DC
  Administered 2024-07-28 – 2024-07-29 (×2): 50 mg via ORAL
  Filled 2024-07-28 (×2): qty 1

## 2024-07-28 MED ORDER — LEVOTHYROXINE SODIUM 25 MCG PO TABS
25.0000 ug | ORAL_TABLET | Freq: Every day | ORAL | Status: DC
  Administered 2024-07-28 – 2024-07-29 (×2): 25 ug via ORAL
  Filled 2024-07-28 (×2): qty 1

## 2024-07-28 MED ORDER — ROSUVASTATIN CALCIUM 20 MG PO TABS
20.0000 mg | ORAL_TABLET | Freq: Every day | ORAL | Status: DC
  Administered 2024-07-28 – 2024-07-29 (×2): 20 mg via ORAL
  Filled 2024-07-28 (×2): qty 1

## 2024-07-28 MED ORDER — LACTATED RINGERS IV SOLN: INTRAVENOUS | Status: AC

## 2024-07-28 NOTE — Progress Notes (Signed)
 Patient seen and examined; admitted after midnight secondary to mechanical fall with ongoing lower back pain and presence of images of retroperitoneal hematoma/hemorrhage.  Hemodynamically stable; no chest pain, no nausea, no vomiting.  Reports no focal deficits and demonstrate good oxygen saturation on room air.  Please refer to H&P written by Dr. Manfred on 07/28/2024 for further review/details on admission.  Plan: - TLSO brace especially when weightbearing and standing - Continue as needed analgesic and muscle relaxant - Follow hemoglobin trend along with vital signs. - Assess clinical response. -Physical therapy and Occupational Therapy evaluation requested and will follow recommendations.   Eric Nunnery MD [phone]

## 2024-07-28 NOTE — Evaluation (Signed)
 Physical Therapy Evaluation Patient Details Name: Casey [name] MRN: [id number] DOB: 09/25/1945 Today's Date: 07/28/2024  History of Present Illness  Casey [name] is a 79 y.o. male with medical history significant of hypertension, hyperlipidemia, permanent atrial fibrillation on rivaroxaban , OSA, bipolar disorder, mild tardive dyskinesia (from olanzepine), RLS, CKD, GERD, depression who presents to the emergency department due to a fall sustained at home.  Patient endorsed a history of panic attack which occurs frequently and this resulted in him falling over onto his handicap ramp complaining of low back, but denies loss of consciousness.  He states that he has been having increased falls recently.   Clinical Impression  Patient demonstrates fair/good carryover for rolling to side and sitting up from side lying position, but required use of bed rail and increased time. Patient unsteady using quad-cane for transfers, safer using RW and tolerated ambulating in room/hallway without loss of balance, but limited mostly due to c/o SOB and fatigue. Patient tolerated sitting up in chair after therapy with spouse present. Patient will benefit from continued skilled physical therapy in hospital and recommended venue below to increase strength, balance, endurance for safe ADLs and gait.          If plan is discharge home, recommend the following: A little help with walking and/or transfers;A little help with bathing/dressing/bathroom;Help with stairs or ramp for entrance;Assist for transportation;Assistance with cooking/housework   Can travel by private vehicle        Equipment Recommendations None recommended by PT  Recommendations for Other Services       Functional Status Assessment Patient has had a recent decline in their functional status and demonstrates the ability to make significant improvements in function in a reasonable and predictable amount of time.     Precautions / Restrictions  Precautions Precautions: Fall Recall of Precautions/Restrictions: Intact Required Braces or Orthoses: Spinal Brace Spinal Brace: Thoracolumbosacral orthotic Restrictions Weight Bearing Restrictions Per Provider Order: No      Mobility  Bed Mobility Overal bed mobility: Needs Assistance Bed Mobility: Rolling, Sidelying to Sit Rolling: Contact guard assist, Min assist Sidelying to sit: Contact guard assist, Min assist       General bed mobility comments: increased time, labored movement    Transfers Overall transfer level: Needs assistance Equipment used: Rolling walker (2 wheels), Quad cane Transfers: Sit to/from Stand, Bed to chair/wheelchair/BSC Sit to Stand: Contact guard assist, Min assist   Step pivot transfers: Min assist       General transfer comment: very unsteady using quad cane, safer using RW    Ambulation/Gait Ambulation/Gait assistance: Contact guard assist, Supervision Gait Distance (Feet): 55 Feet Assistive device: Rolling walker (2 wheels) Gait Pattern/deviations: Decreased step length - right, Decreased step length - left, Decreased stride length Gait velocity: dec     General Gait Details: slow slightly labored movement without loss of balance using RW, limited mostly due to fatigue  Stairs            Wheelchair Mobility     Tilt Bed    Modified Rankin (Stroke Patients Only)       Balance Overall balance assessment: Needs assistance Sitting-balance support: Feet supported, No upper extremity supported Sitting balance-Leahy Scale: Good Sitting balance - Comments: seated at EOB   Standing balance support: During functional activity, No upper extremity supported, Single extremity supported Standing balance-Leahy Scale: Poor Standing balance comment: fair using RW  Pertinent Vitals/Pain Pain Assessment Pain Assessment: 0-10 Pain Score: 2  Pain Location: low back Pain Descriptors /  Indicators: Aching, Sharp Pain Intervention(s): Limited activity within patient's tolerance, Monitored during session, Repositioned    Home Living Family/patient expects to be discharged to:: Private residence Living Arrangements: Spouse/significant other Available Help at Discharge: Family;Friend(s);Available 24 hours/day Type of Home: House Home Access: Ramped entrance     Alternate Level Stairs-Number of Steps: 14 Home Layout: Two level;Able to live on main level with bedroom/bathroom Home Equipment: Rolling Walker (2 wheels);Rollator (4 wheels);Cane - quad;Cane - single point;BSC/3in1;Adaptive equipment      Prior Function Prior Level of Function : Needs assist;History of Falls (last six months)       Physical Assist : ADLs (physical);Mobility (physical) Mobility (physical): Bed mobility;Transfers;Gait;Stairs ADLs (physical): Dressing Mobility Comments: Community ambulator with [facility] ADLs Comments: Assisted to don socks; Independent otherwise.     Extremity/Trunk Assessment   Upper Extremity Assessment Upper Extremity Assessment: Defer to OT evaluation    Lower Extremity Assessment Lower Extremity Assessment: Generalized weakness    Cervical / Trunk Assessment Cervical / Trunk Assessment: Normal  Communication   Communication Communication: No apparent difficulties    Cognition Arousal: Alert Behavior During Therapy: WFL for tasks assessed/performed                             Following commands: Intact       Cueing Cueing Techniques: Verbal cues, Tactile cues     General Comments      Exercises     Assessment/Plan    PT Assessment Patient needs continued PT services  PT Problem List Decreased strength;Decreased activity tolerance;Decreased balance;Decreased mobility;Pain       PT Treatment Interventions DME instruction;Gait training;Stair training;Functional mobility training;Therapeutic activities;Therapeutic exercise;Balance  training;Patient/family education    PT Goals (Current goals can be found in the Care Plan section)  Acute Rehab PT Goals Patient Stated Goal: return home with family to assist PT Goal Formulation: With patient/family Time For Goal Achievement: 07/31/24 Potential to Achieve Goals: Good    Frequency Min 3X/week     Co-evaluation PT/OT/SLP Co-Evaluation/Treatment: Yes Reason for Co-Treatment: To address functional/ADL transfers PT goals addressed during session: Mobility/safety with mobility;Balance;Proper use of DME OT goals addressed during session: ADL's and self-care       AM-PAC PT 6 Clicks Mobility  Outcome Measure Help needed turning from your back to your side while in a flat bed without using bedrails?: A Little Help needed moving from lying on your back to sitting on the side of a flat bed without using bedrails?: A Little Help needed moving to and from a bed to a chair (including a wheelchair)?: A Little Help needed standing up from a chair using your arms (e.g., wheelchair or bedside chair)?: A Little Help needed to walk in hospital room?: A Little Help needed climbing 3-5 steps with a railing? : A Lot 6 Click Score: 17    End of Session Equipment Utilized During Treatment: Back brace Activity Tolerance: Patient tolerated treatment well;Patient limited by fatigue Patient left: in chair;with call bell/phone within reach;with family/visitor present Nurse Communication: Mobility status PT Visit Diagnosis: Unsteadiness on feet (R26.81);Other abnormalities of gait and mobility (R26.89);Muscle weakness (generalized) (M62.81)    Time: 9168-9147 PT Time Calculation (min) (ACUTE ONLY): 21 min   Charges:   PT Evaluation $PT Eval Moderate Complexity: 1 Mod PT Treatments $Therapeutic Activity: 8-22 mins PT General Charges $$ ACUTE  PT VISIT: 1 Visit         2:16 PM, 07/28/2024 Lynwood Music, MPT Physical Therapist with [facility] [address]  [phone] office [phone] mobile phone

## 2024-07-28 NOTE — ED Notes (Signed)
Pt given sandwich and ice water per request. 

## 2024-07-28 NOTE — H&P (Signed)
 " History and Physical    Patient: Casey [name] FMW:[id number] DOB: [birth year] DOA: 07/27/2024 DOS: the patient was seen and examined on 07/28/2024 PCP: [name], MD  Patient coming from: Home  Chief Complaint:  Chief Complaint  Patient presents with   Fall   HPI: Casey [name] is a 79 y.o. male with medical history significant of hypertension, hyperlipidemia, permanent atrial fibrillation on rivaroxaban , OSA, bipolar disorder, mild tardive dyskinesia (from olanzepine), RLS, CKD, GERD, depression who presents to the emergency department due to a fall sustained at home.  Patient endorsed a history of panic attack which occurs frequently and this resulted in him falling over onto his handicap ramp complaining of low back, but denies loss of consciousness.  He states that he has been having increased falls recently.  ED course In the emergency department, in the emergency department, he was hemodynamically stable.  Workup in the ED showed normal CBC, BMP showed bicarb 21, blood glucose 227, BUN 24, creatinine 1.45 (baseline creatinine at 1.2) CT abdomen and pelvis with contrast showed no active bleeding.  Progressive retroperitoneal hemorrhage. Neurosurgery (NP Johnanna) was consulted and discussed with attending Dr. Lanis who recommended TLSO brace and outpatient follow-up.  TRH was asked to admit patient.   Review of Systems: As mentioned in the history of present illness. All other systems reviewed and are negative. Past Medical History:  Diagnosis Date   Arthritis    Asthma    Atrial fibrillation (HCC) 01/12/2010   2D Echo EF=>55%   Bipolar disorder (HCC)    Chronic back pain    Depression    Dysrhythmia    AFib   Elevated PSA    GERD (gastroesophageal reflux disease)    History of colonic polyps    History of gout    Hyperlipidemia    Hypertension    Hypothyroidism    Morbid obesity (HCC)    OSA (obstructive sleep apnea)    AHI was 27.62hr RDI was 34.3 hr REM  0.00hr; had CPAP years ago but no longer uses.   Prostatitis    Tubular adenoma of colon 01/2016   Past Surgical History:  Procedure Laterality Date   APPENDECTOMY  1959   asthma     CATARACT EXTRACTION Bilateral 1995   COLONOSCOPY  2011   RMR: left-sided diverticula, minimal rectal friability. No polyps. Repeat 5 years.   COLONOSCOPY WITH PROPOFOL  N/A 01/15/2016   Dr.Rourk- diverticulosis in the entire examined colon, multiple rectal and colonic polyps, internal hemorrhoids. bx= tubular adenomas and hyperplastic polyps.    COLONOSCOPY WITH PROPOFOL  N/A 03/13/2022   Procedure: COLONOSCOPY WITH PROPOFOL ;  Surgeon: [name], MD;  [facility];  Service: Endoscopy;  Laterality: N/A;  11:00am   HAND / FINGER LESION EXCISION Right    IR THORACENTESIS RIGHT ASP PLEURAL SPACE W/IMG GUIDE  03/01/2019   IR THORACENTESIS RIGHT ASP PLEURAL SPACE W/IMG GUIDE  03/31/2019   POLYPECTOMY  01/15/2016   Procedure: POLYPECTOMY;  Surgeon: Lamar HERO Shaaron, MD;  Location: [facility];  Service: Endoscopy;;   POLYPECTOMY  03/13/2022   Procedure: POLYPECTOMY;  Surgeon: [name], MD;  Location: [facility];  Service: Endoscopy;;   PROSTATE BIOPSY     right shoulder surgery     TONSILLECTOMY  1955   Social History:  reports that he has never smoked. He has never been exposed to tobacco smoke. He has never used smokeless tobacco. He reports that he does not drink alcohol and does  not use drugs.  Allergies[1]  Family History  Problem Relation Age of Onset   Diabetes Mother    Heart failure Mother    Hypertension Mother    Hypertension Father    Cancer Paternal Grandmother    Stroke Sister    Liver disease Brother    Vascular Disease Brother    Arthritis Brother    Colon cancer Neg Hx     Prior to Admission medications  Medication Sig Start Date End Date Taking? Authorizing Provider  acetaminophen  (TYLENOL ) 500 MG tablet Take 1,000 mg by mouth every 6 (six) hours as  needed for headache.    [provider]  albuterol  (VENTOLIN  HFA) 108 (90 Base) MCG/ACT inhaler Inhale 2 puffs into the lungs every 6 (six) hours as needed (Asthma).    [provider]  Cholecalciferol (VITAMIN D3) 250 MCG (10000 UT) capsule Take 10,000 Units by mouth daily.    [provider]  levothyroxine  (SYNTHROID , LEVOTHROID) 25 MCG tablet Take 25 mcg by mouth daily before breakfast. 04/06/15   [provider]  lithium  carbonate 150 MG capsule Take 3 capsules (450 mg total) by mouth at bedtime. 05/26/24   Cottle, Lorene KANDICE Raddle., MD  metoprolol  succinate (TOPROL -XL) 25 MG 24 hr tablet Take 1 tablet by mouth once daily 08/13/23   Croitoru, Mihai, MD  pantoprazole  (PROTONIX ) 40 MG tablet TAKE ONE TABLET BY MOUTH ONCE DAILY 03/07/17   Marvis Camellia LABOR, NP  rivaroxaban  (XARELTO ) 20 MG TABS tablet Take 20 mg by mouth daily after supper.     [provider]  rosuvastatin  (CRESTOR ) 20 MG tablet Take 1 tablet (20 mg total) by mouth daily. 11/26/22   Singh, Prashant K, MD  sertraline  (ZOLOFT ) 50 MG tablet Take 1 tablet (50 mg total) by mouth daily. 05/26/24   Cottle, Lorene KANDICE Raddle., MD  SUPER B COMPLEX/C PO Take 1 tablet by mouth daily.    [provider]    Physical Exam: Vitals:   07/27/24 2240 07/27/24 2300 07/27/24 2315 07/27/24 2330  BP: (!) 97/46 (!) 89/61 118/73 113/70  Pulse: 74 87 98 81  Resp: 16 17 18 17   Temp: 98.1 F (36.7 C)     TempSrc:      SpO2: 97% 96% 96% 97%  Weight:      Height:       General: Elderly male. Awake and alert and oriented x3. Not in any acute distress.  HEENT: Noted sutured laceration of scalp.  PERRLA. EOMI. Sclerae anicteric. Dry mucosal membranes. Neck: Neck supple without lymphadenopathy. No carotid bruits. No masses palpated.  Cardiovascular: Regular rate with normal S1-S2 sounds. No murmurs, rubs or gallops auscultated. No JVD.  Respiratory: Clear breath sounds.  No accessory muscle use. Abdomen: Soft,  nontender, nondistended. Active bowel sounds. No masses or hepatosplenomegaly  Skin: Left elbow abrasion. Dry, warm to touch. Musculoskeletal:  2+ dorsalis pedis and radial pulses. Good ROM.  No contractures  Psychiatric: Intact judgment and insight.  Mood appropriate to current condition. Neurologic: No focal neurological deficits. Strength is 5/5 x 4.  CN II - XII grossly intact.  Assessment and Plan: Lumbar fracture CTA of pelvis showed hyperextension type injury at L2-3 with fracture through the anterior osteophyte and widening of the disc space, and a minimally displaced fracture through the spinous process of L3. Continue IV Dilaudid  0.5 mg every 4 hours as needed Continue TLSO Continue fall precaution Continue PT/OT eval and treat Close outpatient follow-up with neurosurgery  Retroperitoneal hemorrhage CT  angiography of abdomen and pelvis with contrast showed no acute GI bleed but progressive retroperitoneal hemorrhage which was nonconcerning to neurosurgery consulted by EDP Continue to monitor H/H to confirm no ongoing loss of blood  Acute kidney injury Creatinine 1.45 (baseline creatinine at 1.2) Continue gentle hydration Renally adjust medications, avoid nephrotoxic agents/dehydration/hypotension  Hyperglycemia possibly reactive Patient without history of diabetes Hemoglobin A1c will be checked Continue CBGs Consider ISS and hypoglycemia protocol if blood glucose continue to stay elevated  Incidental lung mass CT abdomen and pelvis showed a 3.5 x 2 cm left lower lobe subpleural soft tissue density with trace loculated left pleural effusion and associated pleural calcifications Patient may need outpatient follow-up with pulmonology/oncology   Renal mass CT abdomen pelvis showed an  indeterminate 2.9 x 1.9 cm right renal mass measuring 64 Hounsfield Units Renal protocol MRI or CT without and with contrast for characterization was recommended by radiologist  Acquired  hypothyroidism  Continue Synthroid    OSA Continue CPAP    Mixed hyperlipidemia Continue Crestor    Bipolar disorder, Bipolar depression Continue lithium , Zoloft   Essential hypertension  Continue Toprol -XL   Atrial Fibrillation permanent Continue Toprol  XL for ventricular rate control Xarelto  will be temporarily held due to retroperitoneal hemorrhage  GERD  Continue Protonix      Advance Care Planning: Full code  Consults: None  Family Communication: None at bedside  Severity of Illness: The appropriate patient status for this patient is OBSERVATION. Observation status is judged to be reasonable and necessary in order to provide the required intensity of service to ensure the patient's safety. The patient's presenting symptoms, physical exam findings, and initial radiographic and laboratory data in the context of their medical condition is felt to place them at decreased risk for further clinical deterioration. Furthermore, it is anticipated that the patient will be medically stable for discharge from the hospital within 2 midnights of admission.   Author: Boris Engelmann, DO 07/28/2024 12:09 AM  For on call review [email or web address].      [1]  Allergies Allergen Reactions   Amlodipine Other (See Comments)    Stopped due to kidney issues   Vicodin [Hydrocodone -Acetaminophen ] Other (See Comments)    Patient is unsure of reaction   "

## 2024-07-28 NOTE — Evaluation (Signed)
 Occupational Therapy Evaluation Patient Details Name: Casey [name] MRN: [id number] DOB: 1946/06/12 Today's Date: 07/28/2024   History of Present Illness   Casey [name] is a 79 y.o. male with medical history significant of hypertension, hyperlipidemia, permanent atrial fibrillation on rivaroxaban , OSA, bipolar disorder, mild tardive dyskinesia (from olanzepine), RLS, CKD, GERD, depression who presents to the emergency department due to a fall sustained at home.  Patient endorsed a history of panic attack which occurs frequently and this resulted in him falling over onto his handicap ramp complaining of low back, but denies loss of consciousness.  He states that he has been having increased falls recently. (per DO)     Clinical Impressions Pt agreeable to OT and PT co-evaluation. Pt assisted for sock at baseline. Today pt required min A for bed mobility and functional transfer without RW. Pt needing CGA with RW for functional ambulation. Assist for donning socks today as pt gets at baseline. Pt demonstrates set up level of assist for upper body ADL's. Pt left in the chair with call bell within reach and family present. Pt will benefit from continued OT in the hospital to increase strength, balance, and endurance for safe ADL's.        If plan is discharge home, recommend the following:   A little help with walking and/or transfers;A lot of help with bathing/dressing/bathroom;Assist for transportation;Help with stairs or ramp for entrance     Functional Status Assessment   Patient has had a recent decline in their functional status and demonstrates the ability to make significant improvements in function in a reasonable and predictable amount of time.     Equipment Recommendations   None recommended by OT             Precautions/Restrictions   Precautions Precautions: Fall Recall of Precautions/Restrictions: Intact Restrictions Weight Bearing Restrictions Per Provider  Order: No     Mobility Bed Mobility Overal bed mobility: Needs Assistance Bed Mobility: Supine to Sit     Supine to sit: Min assist     General bed mobility comments: Single hand held assist to pull to sit at EOB.    Transfers Overall transfer level: Needs assistance   Transfers: Sit to/from Stand, Bed to chair/wheelchair/BSC Sit to Stand: Contact guard assist, Min assist     Step pivot transfers: Min assist     General transfer comment: EOB to chair with pt seeking single hand held assist.      Balance Overall balance assessment: Needs assistance Sitting-balance support: No upper extremity supported, Feet supported Sitting balance-Leahy Scale: Good Sitting balance - Comments: seated at EOB   Standing balance support: During functional activity, No upper extremity supported Standing balance-Leahy Scale: Poor Standing balance comment: poor to fair without RW; fair to good with RW.                           ADL either performed or assessed with clinical judgement   ADL Overall ADL's : Needs assistance/impaired     Grooming: Set up;Sitting   Upper Body Bathing: Set up;Sitting   Lower Body Bathing: Moderate assistance;Sitting/lateral leans   Upper Body Dressing : Set up;Sitting   Lower Body Dressing: Maximal assistance;Sitting/lateral leans   Toilet Transfer: Minimal assistance;Stand-pivot Toilet Transfer Details (indicate cue type and reason): EOB to chair without AD with min A. Toileting- Clothing Manipulation and Hygiene: Contact guard assist;Minimal assistance;Sitting/lateral lean       Functional mobility during ADLs: Contact  guard assist;Rolling walker (2 wheels) General ADL Comments: Able to ambualte in the hall with RW.     Vision Baseline Vision/History: 1 Wears glasses Ability to See in Adequate Light: 1 Impaired Patient Visual Report: No change from baseline Vision Assessment?: No apparent visual deficits     Perception  Perception: Not tested       Praxis Praxis: Not tested       Pertinent Vitals/Pain Pain Assessment Pain Assessment: 0-10 Pain Score: 2  Pain Location: low back Pain Descriptors / Indicators: Aching, Sharp Pain Intervention(s): Limited activity within patient's tolerance, Monitored during session, Repositioned     Extremity/Trunk Assessment Upper Extremity Assessment Upper Extremity Assessment: Generalized weakness   Lower Extremity Assessment Lower Extremity Assessment: Defer to PT evaluation   Cervical / Trunk Assessment Cervical / Trunk Assessment: Normal   Communication Communication Communication: No apparent difficulties   Cognition Arousal: Alert Behavior During Therapy: WFL for tasks assessed/performed Cognition: No apparent impairments                               Following commands: Intact       Cueing  General Comments   Cueing Techniques: Verbal cues;Tactile cues                 Home Living Family/patient expects to be discharged to:: Private residence Living Arrangements: Spouse/significant other Available Help at Discharge: Family;Friend(s);Available 24 hours/day Type of Home: House Home Access: Ramped entrance     Home Layout: Two level;Able to live on main level with bedroom/bathroom     Bathroom Shower/Tub: Tub/shower unit   Bathroom Toilet: Handicapped height Bathroom Accessibility: Yes How Accessible: Accessible via walker Home Equipment: Rolling Walker (2 wheels);Rollator (4 wheels);Cane - quad;Cane - single point;BSC/3in1;Adaptive equipment Adaptive Equipment: Reacher        Prior Functioning/Environment Prior Level of Function : Needs assist;History of Falls (last six months) (6-8 falls in 6 months)       Physical Assist : ADLs (physical)   ADLs (physical): Dressing Mobility Comments: Community ambulator with [facility] ADLs Comments: Assisted to don socks; Independent otherwise.    OT Problem List:  Decreased strength;Decreased activity tolerance;Impaired balance (sitting and/or standing);Pain   OT Treatment/Interventions: Self-care/ADL training;Therapeutic exercise;DME and/or AE instruction;Therapeutic activities;Patient/family education;Balance training      OT Goals(Current goals can be found in the care plan section)   Acute Rehab OT Goals Patient Stated Goal: Return home. OT Goal Formulation: With patient Time For Goal Achievement: 08/11/24 Potential to Achieve Goals: Good   OT Frequency:  Min 2X/week    Co-evaluation PT/OT/SLP Co-Evaluation/Treatment: Yes Reason for Co-Treatment: To address functional/ADL transfers   OT goals addressed during session: ADL's and self-care      AM-PAC OT 6 Clicks Daily Activity     Outcome Measure Help from another person eating meals?: None Help from another person taking care of personal grooming?: A Little Help from another person toileting, which includes using toliet, bedpan, or urinal?: A Little Help from another person bathing (including washing, rinsing, drying)?: A Little Help from another person to put on and taking off regular upper body clothing?: A Little Help from another person to put on and taking off regular lower body clothing?: A Lot 6 Click Score: 18   End of Session Equipment Utilized During Treatment: Rolling walker (2 wheels);Gait belt  Activity Tolerance: Patient tolerated treatment well Patient left: in chair;with call bell/phone within reach;with family/visitor present  OT  Visit Diagnosis: Unsteadiness on feet (R26.81);Other abnormalities of gait and mobility (R26.89);Muscle weakness (generalized) (M62.81);History of falling (Z91.81);Repeated falls (R29.6)                Time: 9166-9146 OT Time Calculation (min): 20 min Charges:  OT General Charges $OT Visit: 1 Visit OT Evaluation $OT Eval Low Complexity: 1 Low  Emani Taussig OT, MOT   Jayson Person 07/28/2024, 11:38 AM

## 2024-07-28 NOTE — Progress Notes (Signed)
 central tele monitored pt HR missing beats and dropping into the 30's at 1553

## 2024-07-28 NOTE — TOC Initial Note (Signed)
 Transition of Care [facility]) - Initial/Assessment Note    Patient Details  Name: Casey [name] MRN: [id number] Date of Birth: 1945-08-08  [facility] CM/SW Contact:    Lucie Lunger, LCSWA Phone Number: 07/28/2024, 12:41 PM  Clinical Narrative:                 CSW notes PT is recommending HH PT for pt at D/C. CSW spoke with pt and spouse at bedside to complete assessment. Pt states that he is normally independent in completing his ADLs and is able to drive when needed. Pt states he has canes and walkers in the home. CSW inquired about pts interest in [facility] being arranged. Pt states he is agreeable, he has used [facility] in the past, referral sent to [facility] for review. TOC to follow.   Expected Discharge Plan: Home w Home Health Services Barriers to Discharge: Continued Medical Work up   Patient Goals and CMS Choice Patient states their goals for this hospitalization and ongoing recovery are:: return home CMS Medicare.gov Compare Post Acute Care list provided to:: Patient Choice offered to / list presented to : Patient      Expected Discharge Plan and Services In-house Referral: Clinical Social Work Discharge Planning Services: CM Consult Post Acute Care Choice: Home Health Living arrangements for the past 2 months: Single Family Home                           HH Arranged: PT, OT          Prior Living Arrangements/Services Living arrangements for the past 2 months: Single Family Home Lives with:: Spouse   Do you feel safe going back to the place where you live?: Yes      Need for Family Participation in Patient Care: Yes (Comment) Care giver support system in place?: Yes (comment) Current home services: DME Criminal Activity/Legal Involvement Pertinent to Current Situation/Hospitalization: No - Comment as needed  Activities of Daily Living   ADL Screening (condition at time of admission) Independently performs ADLs?: Yes (appropriate for developmental  age) (prior to fall) Is the patient deaf or have difficulty hearing?: No Does the patient have difficulty seeing, even when wearing glasses/contacts?: No Does the patient have difficulty concentrating, remembering, or making decisions?: No  Permission Sought/Granted                  Emotional Assessment Appearance:: Appears stated age Attitude/Demeanor/Rapport: Engaged Affect (typically observed): Accepting Orientation: : Oriented to Self, Oriented to  Time, Oriented to Place, Oriented to Situation Alcohol / Substance Use: Not Applicable Psych Involvement: No (comment)  Admission diagnosis:  Retroperitoneal hematoma [K68.3] Lumbar compression fracture (HCC) [S32.000A] Laceration of scalp, initial encounter [S01.01XA] Closed fracture of lumbar vertebra, unspecified fracture morphology, unspecified lumbar vertebral level, initial encounter (HCC) [S32.009A] Patient Active Problem List   Diagnosis Date Noted   Lumbar compression fracture (HCC) 07/28/2024   Malignant neoplasm of prostate (HCC) 03/17/2024   Aortic calcification 03/17/2024   Pleural effusion on left 10/08/2023   DOE (dyspnea on exertion) 10/07/2023   Acute CVA (cerebrovascular accident) (HCC) 11/24/2022   Pain in left shoulder 02/21/2021   Constipation 12/28/2018   N&V (nausea and vomiting) 12/28/2018   GERD (gastroesophageal reflux disease) 12/28/2018   Abdominal pain 12/28/2018   Weight loss, unintentional 12/28/2018   Atrial fibrillation with RVR (HCC)    Sepsis (HCC) 06/13/2018   Acute metabolic encephalopathy 06/13/2018   Thrombocytopenia 06/13/2018  Renal insufficiency 06/13/2018   Anxiety 06/12/2018   Bipolar disorder (HCC) 06/12/2018   Chronic anticoagulation 05/05/2017   Sleep apnea 05/05/2017   Edema leg 05/05/2017   Dysphasia 02/07/2017   Mild obesity 05/10/2016   History of adenomatous polyp of colon    Diverticulosis of colon without hemorrhage    Hyperlipidemia LDL goal <130 05/16/2013    KNEE PAIN 08/07/2009   KNEE SPRAIN, ACUTE 08/07/2009   History of colonic polyps 05/10/2009   Hypothyroidism 05/08/2009   History of bipolar disorder 05/08/2009   Essential hypertension 05/08/2009   Permanent atrial fibrillation 05/08/2009   HEMATOCHEZIA 05/08/2009   LOW BACK PAIN, CHRONIC 05/08/2009   ABDOMINAL PAIN 05/08/2009   PCP:  [name], MD Pharmacy:   [facility] [address] [place] [address] [place] [address] [place] [address] Phone: [phone] Fax: [phone]     Social Drivers of Health (SDOH) Social History: SDOH Screenings   Food Insecurity: No Food Insecurity (07/28/2024)  Housing: Low Risk (07/28/2024)  Transportation Needs: No Transportation Needs (07/28/2024)  Utilities: Not At Risk (07/28/2024)  Alcohol Screen: Low Risk (03/17/2024)  Depression (PHQ2-9): Low Risk (03/17/2024)  Social Connections: Moderately Isolated (07/28/2024)  Tobacco Use: Low Risk (07/27/2024)   SDOH Interventions:     Readmission Risk Interventions     No data to display

## 2024-07-28 NOTE — Plan of Care (Signed)
" °  Problem: Education: Goal: Knowledge of General Education information will improve Description: Including pain rating scale, medication(s)/side effects and non-pharmacologic comfort measures Outcome: Progressing   Problem: Health Behavior/Discharge Planning: Goal: Ability to manage health-related needs will improve Outcome: Progressing   Problem: Clinical Measurements: Goal: Ability to maintain clinical measurements within normal limits will improve Outcome: Progressing Goal: Cardiovascular complication will be avoided Outcome: Progressing   Problem: Activity: Goal: Risk for activity intolerance will decrease Outcome: Progressing   Problem: Coping: Goal: Level of anxiety will decrease Outcome: Progressing   Problem: Pain Managment: Goal: General experience of comfort will improve and/or be controlled Outcome: Progressing   Problem: Safety: Goal: Ability to remain free from injury will improve Outcome: Progressing   "

## 2024-07-28 NOTE — Plan of Care (Signed)
" °  Problem: Acute Rehab PT Goals(only PT should resolve) Goal: Pt Will Go Supine/Side To Sit Outcome: Progressing Flowsheets (Taken 07/28/2024 1418) Pt will go Supine/Side to Sit:  with modified independence  with supervision Goal: Patient Will Transfer Sit To/From Stand Outcome: Progressing Flowsheets (Taken 07/28/2024 1418) Patient will transfer sit to/from stand:  with modified independence  with supervision Goal: Pt Will Transfer Bed To Chair/Chair To Bed Outcome: Progressing Flowsheets (Taken 07/28/2024 1418) Pt will Transfer Bed to Chair/Chair to Bed:  with modified independence  with supervision Goal: Pt Will Ambulate Outcome: Progressing Flowsheets (Taken 07/28/2024 1418) Pt will Ambulate:  75 feet  with supervision  with modified independence  with rolling walker   2:18 PM, 07/28/2024 Lynwood Music, MPT Physical Therapist with [facility] [phone] office [phone] mobile phone  "

## 2024-07-28 NOTE — Plan of Care (Signed)
" °  Problem: Acute Rehab OT Goals (only OT should resolve) Goal: Pt. Will Perform Grooming Flowsheets (Taken 07/28/2024 1141) Pt Will Perform Grooming: with modified independence Goal: Pt. Will Perform Lower Body Bathing Flowsheets (Taken 07/28/2024 1141) Pt Will Perform Lower Body Bathing: with modified independence Goal: Pt. Will Perform Lower Body Dressing Flowsheets (Taken 07/28/2024 1141) Pt Will Perform Lower Body Dressing: with modified independence Goal: Pt. Will Transfer To Toilet Flowsheets (Taken 07/28/2024 1141) Pt Will Transfer to Toilet: with modified independence Goal: Pt. Will Perform Toileting-Clothing Manipulation Flowsheets (Taken 07/28/2024 1141) Pt Will Perform Toileting - Clothing Manipulation and hygiene: with modified independence Goal: Pt/Caregiver Will Perform Home Exercise Program Flowsheets (Taken 07/28/2024 1141) Pt/caregiver will Perform Home Exercise Program:  Increased strength  Both right and left upper extremity  Independently  Addisen Chappelle OT, MOT  "

## 2024-07-28 NOTE — Progress Notes (Signed)
 Orthopedic Tech Progress Note Patient Details:  Casey [name] 04-Oct-1945 [id number]  Ortho Devices Type of Ortho Device: Thoracolumbar corset (TLSO) Ortho Device/Splint Location: Back Ortho Device/Splint Interventions: Ordered    Called TLSO Brace into [facility].  Casey [name] 07/28/2024, 7:05 PM

## 2024-07-28 NOTE — Care Management Obs Status (Signed)
 MEDICARE OBSERVATION STATUS NOTIFICATION   Patient Details  Name: EZEKIAL ARNS MRN: [id number] Date of Birth: 10/31/1945   Medicare Observation Status Notification Given:  Yes    Lucie Lunger, LCSWA 07/28/2024, 12:21 PM

## 2024-07-29 ENCOUNTER — Observation Stay (HOSPITAL_COMMUNITY)

## 2024-07-29 DIAGNOSIS — F411 Generalized anxiety disorder: Secondary | ICD-10-CM

## 2024-07-29 DIAGNOSIS — F410 Panic disorder [episodic paroxysmal anxiety] without agoraphobia: Secondary | ICD-10-CM

## 2024-07-29 DIAGNOSIS — S0101XA Laceration without foreign body of scalp, initial encounter: Secondary | ICD-10-CM | POA: Diagnosis not present

## 2024-07-29 DIAGNOSIS — F3132 Bipolar disorder, current episode depressed, moderate: Secondary | ICD-10-CM

## 2024-07-29 DIAGNOSIS — K683 Retroperitoneal hematoma: Secondary | ICD-10-CM | POA: Diagnosis not present

## 2024-07-29 DIAGNOSIS — S32009A Unspecified fracture of unspecified lumbar vertebra, initial encounter for closed fracture: Secondary | ICD-10-CM

## 2024-07-29 LAB — GLUCOSE, CAPILLARY
Glucose-Capillary: 108 mg/dL — ABNORMAL HIGH (ref 70–99)
Glucose-Capillary: 119 mg/dL — ABNORMAL HIGH (ref 70–99)
Glucose-Capillary: 125 mg/dL — ABNORMAL HIGH (ref 70–99)
Glucose-Capillary: 99 mg/dL (ref 70–99)

## 2024-07-29 MED ORDER — OXYCODONE HCL 5 MG PO TABS
5.0000 mg | ORAL_TABLET | Freq: Four times a day (QID) | ORAL | Status: DC | PRN
  Administered 2024-07-29: 5 mg via ORAL
  Filled 2024-07-29: qty 1

## 2024-07-29 MED ORDER — ACETAMINOPHEN 500 MG PO TABS: 1000.0000 mg | ORAL_TABLET | Freq: Three times a day (TID) | ORAL | Status: AC | PRN

## 2024-07-29 MED ORDER — METHOCARBAMOL 500 MG PO TABS: 500.0000 mg | ORAL_TABLET | Freq: Three times a day (TID) | ORAL | Status: DC | PRN

## 2024-07-29 MED ORDER — ACETAMINOPHEN 325 MG PO TABS
650.0000 mg | ORAL_TABLET | Freq: Four times a day (QID) | ORAL | Status: DC | PRN
  Administered 2024-07-29: 650 mg via ORAL
  Filled 2024-07-29: qty 2

## 2024-07-29 MED ORDER — OXYCODONE HCL 5 MG PO TABS: 5.0000 mg | ORAL_TABLET | Freq: Three times a day (TID) | ORAL | 0 refills | Status: AC | PRN

## 2024-07-29 MED ORDER — HYDROMORPHONE HCL 1 MG/ML IJ SOLN: 0.5000 mg | INTRAMUSCULAR | Status: DC | PRN

## 2024-07-29 NOTE — Discharge Summary (Signed)
 " Physician Discharge Summary   Patient: Casey [name] MRN: [id number] DOB: Oct 16, 1945  Admit date:     07/27/2024  Discharge date: 07/29/24  Discharge Physician: Eric Nunnery   PCP: [name], MD   Recommendations at discharge:  Make sure patient follow-up with neurosurgery as instructed Repeat basic metabolic panel to follow ultralights renal function Reassess blood pressure and adjust antihypertensive regimen as needed Make sure patient follow-up with cardiology service as discussed. Please make sure he has follow-up with pulmonology/oncology based on incidental abnormal lung findings (see below).  Discharge Diagnoses: Principal Problem:   Lumbar compression fracture (HCC) Active Problems:   Retroperitoneal hematoma   Panic disorder without agoraphobia   Fx lumbar vertebra-closed (HCC)   Generalized anxiety disorder   Scalp laceration   Brief Hospital admission narrative: Casey [name] is a 79 y.o. male with medical history significant of hypertension, hyperlipidemia, permanent atrial fibrillation on rivaroxaban , OSA, bipolar disorder, mild tardive dyskinesia (from olanzepine), RLS, CKD, GERD, depression who presents to the emergency department due to a fall sustained at home.  Patient endorsed a history of panic attack which occurs frequently and this resulted in him falling over onto his handicap ramp complaining of low back, but denies loss of consciousness.  He states that he has been having increased falls recently.   ED course In the emergency department, in the emergency department, he was hemodynamically stable.  Workup in the ED showed normal CBC, BMP showed bicarb 21, blood glucose 227, BUN 24, creatinine 1.45 (baseline creatinine at 1.2) CT abdomen and pelvis with contrast showed no active bleeding.  Progressive retroperitoneal hemorrhage. Neurosurgery (NP [facility]anna) was consulted and discussed with attending Dr. Lanis who recommended TLSO brace and outpatient  follow-up.  [facility] was asked to admit patient.  Assessment and Plan: 1-lumbar fracture - Continue as needed analgesics - Patient evaluated by PT/OT with recommendation for home health services at discharge - Following recommendations by neurosurgery continue the use of TLSO brace while bearing weight and follow-up as an outpatient.  2-retroperitoneal hemorrhage/hematoma - Appears to be associated with lumbar fracture/mechanical fall - Patient anticoagulation therapy was held for 24 hours - Hemoglobin and vital signs remained stable - Safe to resume anticoagulation at discharge - Continue to follow trend.  3-acute kidney injury - In the setting of prerenal azotemia/mild dehydration most likely - Gentle fluid resuscitation provided - Continue minimizing nephrotoxic agents - At discharge renal function back to baseline.  4-incidental lung mass - CT abdomen and pelvis demonstrating 3.5 x 2 cm left lower lobe subpleural soft tissue density with trace loculated left pleural effusion and associated pleural calcification - Outpatient follow-up with pulmonologist/oncology recommended.  5-renal mass - CT abdomen demonstrating indeterminate 2.9 x 1.9 cm right renal mass measuring 64 Hounsfield units - Continue patient follow-up with urology service (patient already actively following with them).  6-hypothyroidism - Continue Synthroid .  7-mixed hyperlipidemia - Continue statin.  8-obstructive sleep apnea - Continue CPAP nightly.  9-GERD - Continue PPI.  10-chronic atrial fibrillation - Continue Toprol  for ventricular rate control - Continue Xarelto  - Continue outpatient follow-up with cardiology service.  11-sundowning/confusion - Patient with transient episode of confusion at the end of the day throughout hospitalization - CT images done at time of admission and repeated prior to discharge demonstrating no intracranial abnormalities - Would recommend continue constant  reorientation - Minimize the use of sedative agents and outpatient follow-up with PCP.  Consultants: Neurosurgery Procedures performed: See below for x-ray reports. Disposition: Home with home  health services. Diet recommendation: Heart healthy/low-sodium diet.  DISCHARGE MEDICATION: Allergies as of 07/29/2024       Reactions   Amlodipine Other (See Comments)   Stopped due to kidney issues   Vicodin [hydrocodone -acetaminophen ] Other (See Comments)   Patient is unsure of reaction        Medication List     TAKE these medications    acetaminophen  500 MG tablet Commonly known as: TYLENOL  Take 2 tablets (1,000 mg total) by mouth every 8 (eight) hours as needed. What changed:  when to take this reasons to take this   albuterol  108 (90 Base) MCG/ACT inhaler Commonly known as: VENTOLIN  HFA Inhale 2 puffs into the lungs every 6 (six) hours as needed (Asthma).   levothyroxine  25 MCG tablet Commonly known as: SYNTHROID  Take 25 mcg by mouth daily before breakfast.   lithium  carbonate 150 MG capsule Take 3 capsules (450 mg total) by mouth at bedtime.   metoprolol  succinate 25 MG 24 hr tablet Commonly known as: TOPROL -XL Take 1 tablet by mouth once daily   oxyCODONE  5 MG immediate release tablet Commonly known as: Oxy IR/ROXICODONE  Take 1 tablet (5 mg total) by mouth every 8 (eight) hours as needed for severe pain (pain score 7-10).   pantoprazole  40 MG tablet Commonly known as: PROTONIX  TAKE ONE TABLET BY MOUTH ONCE DAILY   rivaroxaban  20 MG Tabs tablet Commonly known as: XARELTO  Take 20 mg by mouth daily after supper.   rosuvastatin  20 MG tablet Commonly known as: CRESTOR  Take 1 tablet (20 mg total) by mouth daily.   sertraline  50 MG tablet Commonly known as: ZOLOFT  Take 1 tablet (50 mg total) by mouth daily.   SUPER B COMPLEX/C PO Take 1 tablet by mouth daily.   Vitamin D3 250 MCG (10000 UT) capsule Take 2,000 Units by mouth daily.                Discharge Care Instructions  (From admission, onward)           Start     Ordered   07/29/24 0000  Discharge wound care:       Comments: Keep area clean and dry.   07/29/24 1646            Contact information for follow-up providers     [name], MD. Schedule an appointment as soon as possible for a visit in 10 day(s).   Specialty: Family Medicine Contact information: [address] [place] [address] [phone]         Lanis Pupa, MD. Schedule an appointment as soon as possible for a visit in 3 week(s).   Specialty: Neurosurgery Contact information: [address] [place] [address] [phone]              Contact information for after-discharge care     Home Medical Care     [facility]) .   Service: Home Health Services Contact information: [address] [place]  [address] [phone]                    Discharge Exam: Fredricka Weights   07/27/24 1441  Weight: 97 kg   General exam: Alert, awake, following commands appropriately and in no acute distress. Respiratory system: Good saturation on room air. Cardiovascular system: Rate controlled, no rubs, no gallops, no JVD. Gastrointestinal system: Abdomen is nondistended, soft and nontender. No organomegaly or masses felt. Normal bowel sounds heard.  Central nervous system: Moving 4 limbs spontaneously.  No focal neurological deficits. Extremities: No cyanosis or clubbing.  Complaining of lower back pain. Skin: No petechiae. Psychiatry: Judgement and insight appear stable; no restlessness or agitation appreciated.   Condition at discharge: Stable and improved.  The results of significant diagnostics from this hospitalization (including imaging, microbiology, ancillary and laboratory) are listed below for reference.   Imaging Studies: CT HEAD WO CONTRAST ( ) Result Date: 07/29/2024 EXAM: CT  HEAD WITHOUT CONTRAST 07/29/2024 12:34:50 PM TECHNIQUE: CT of the head was performed without the administration of intravenous contrast. Automated exposure control, iterative reconstruction, and/or weight based adjustment of the mA/kV was utilized to reduce the radiation dose to as low as reasonably achievable. COMPARISON: head CT 07/27/2024 CLINICAL HISTORY: Head trauma, moderate-severe. FINDINGS: BRAIN AND VENTRICLES: There is mild to moderate cerebral atrophy. Cerebral white matter hypodensities are unchanged and nonspecific but compatible with mild chronic small vessel ischemic disease. Calcified atherosclerotic plaque at the skull base. ORBITS: Bilateral cataract extraction. SINUSES: No acute abnormality. SOFT TISSUES AND SKULL: Midline frontal scalp staples. No skull fracture. IMPRESSION: 1. No acute intracranial abnormality. 2. Mild chronic small vessel ischemic disease. Electronically signed by: Dasie Hamburg MD 07/29/2024 12:56 PM EST RP Workstation: [id number]   CT ANGIO GI BLEED Result Date: 07/27/2024 EXAM: CTA ABDOMEN AND PELVIS WITH CONTRAST 07/27/2024 08:15:14 PM TECHNIQUE: CTA images of the abdomen and pelvis with 100 mL of iohexol  (OMNIPAQUE ) 350 MG/ML injection intravenous contrast. Three-dimensional MIP/volume rendered formations were performed. Automated exposure control, iterative reconstruction, and/or weight based adjustment of the mA/kV was utilized to reduce the radiation dose to as low as reasonably achievable. COMPARISON: 07/27/2024 04:52 PM. CLINICAL HISTORY: Abdominal trauma, blunt. Fall, gastrointestinal hemorrhage, retroperitoneal hemorrhage. FINDINGS: VASCULATURE: GI BLEED: No active gastrointestinal hemorrhage. AORTA: Mild atherosclerotic calcification within the abdominal aorta. There is mild periaortic infiltration in keeping with retroperitoneal hemorrhage which appears progressive since prior examination completed at 4:52 pm. There is, however, no active extravasation  identified. There is no pseudoaneurysm, dissection, or intimal injury identified. The aorta is of normal caliber. Slight overall increase in retroperitoneal infiltrative hemorrhage since prior examination, however, no mass-forming hematoma is identified. Again, no active extravasation is identified. CELIAC TRUNK: No acute finding. No occlusion or significant stenosis. SUPERIOR MESENTERIC ARTERY: No acute finding. No occlusion or significant stenosis. INFERIOR MESENTERIC ARTERY: No acute finding. No occlusion or significant stenosis. RENAL ARTERIES: Retroaortic left renal vein. No occlusion or significant stenosis. ILIAC ARTERIES: No acute finding. No occlusion or significant stenosis. ABDOMEN/PELVIS: LOWER CHEST: Visualized portion of the lower chest demonstrates no acute abnormality. LIVER: The liver is unremarkable. GALLBLADDER AND BILE DUCTS: Gallbladder is unremarkable. No biliary ductal dilatation. SPLEEN: The spleen is unremarkable. PANCREAS: The pancreas is unremarkable. ADRENAL GLANDS: Bilateral adrenal glands demonstrate no acute abnormality. KIDNEYS, URETERS AND BLADDER: Retroaortic left renal vein. The kidneys are normal in size and position. There are simple cortical cysts noted bilaterally for which no follow up imaging is recommended. However, an exophytic hypodense lesion is seen rising from the posterior interpolar region of the right kidney measuring 3 cm in diameter which demonstrates possible enhancement when compared to prior noncontrast CT examination but is not optimally characterized on this exam. This may represent a hyperdense renal cyst risk along with renal mass. Multiple nonobstructing calculi are seen within the kidneys bilaterally measuring up to 6 mm within the lower pole of the right kidney. No hydronephrosis. No ureteral calculi. No perinephric or periureteral stranding. The bladder is unremarkable. GI AND  BOWEL: Small hiatal hernia. Stomach and duodenal sweep demonstrate no acute  abnormality. No active gastrointestinal hemorrhage. Appendectomy has been performed. There is no bowel obstruction. No abnormal bowel wall thickening or distension. REPRODUCTIVE: Reproductive organs are unremarkable. PERITONEUM AND RETROPERITONEUM: Retroperitoneal hemorrhage, as described in the AORTA section, appears progressive since prior examination completed at 4:52 pm, with slight overall increase in infiltrative hemorrhage, however, no mass-forming hematoma is identified. No active extravasation is identified. No ascites or free air. LYMPH NODES: No lymphadenopathy. BONES AND SOFT TISSUES: Hydroxyzine hemangioma within the left pubic symphysis. Degenerative changes are seen throughout the visualized thoracolumbar spine. A hyperextension type injury with fracture through the anterior osteophyte of L2-L3 and widening of the disc space at this level is again identified. Minimally displaced fracture through the spinous process of L3 is again identified. Previously noted retroperitoneal hemorrhage appears centered at this level with infiltration of the adjacent psoas musculature bilaterally. Moderate fat-containing bilateral inguinal hernias are present. No other acute bone abnormality identified. No acute soft tissue abnormality. IMPRESSION: 1. No active GI bleeding. 2. Progressive retroperitoneal hemorrhage with mild periaortic infiltration, without active extravasation or mass-forming hematoma. No evidence of acute aortic injury. 3. Exophytic hypodense lesion in the right kidney, measuring 3 cm, with possible enhancement and not optimally characterized, for which renal protocol MRI without and with contrast is recommended once the patient's acute issues have resolved to confirm characterization. 4. Moderate fat-containing bilateral inguinal hernias. 5. Hyperextension type injury at L2-3 with fracture through the anterior osteophyte and widening of the disc space, and a minimally displaced fracture through the  spinous process of L3. 6. RAF score: Aortic atherosclerosis. Electronically signed by: Dorethia Molt MD 07/27/2024 08:41 PM EST RP Workstation: [id number]   MR LUMBAR SPINE WO CONTRAST Result Date: 07/27/2024 CLINICAL DATA:  Initial evaluation for acute trauma, fall. EXAM: MRI LUMBAR SPINE WITHOUT CONTRAST TECHNIQUE: Multiplanar, multisequence MR imaging of the lumbar spine was performed. No intravenous contrast was administered. COMPARISON:  Prior CT from earlier the same day. FINDINGS: Segmentation: Standard. Lowest well-formed disc space labeled the L5-S1 level. Alignment: Mild levoscoliosis. Alignment otherwise normal with preservation of the normal lumbar lordosis. No significant listhesis or static subluxation. Vertebrae: Acute transversely oriented fracture extending through a bridging anterior osteophyte at the level of the L2-3 interspace, corresponding with abnormality on prior CT. Extension through the superior endplate of L3 which is lifted from the L3 vertebral body (series 6, image 9). Finding consistent with an acute distraction type fracture/hyperextension injury. No visible extension of the fracture line through the posterior elements. No associated epidural hematoma. No significant height loss or retropulsion at the L3 vertebral body itself. Otherwise, vertebral body height maintained with no other acute or chronic fracture. Underlying bone marrow signal intensity within normal limits. Few scattered benign hemangiomata noted. No worrisome osseous lesions. No other significant abnormal marrow edema. Conus medullaris and cauda equina: Conus extends to the T12 level. Conus and cauda equina appear normal. Paraspinal and other soft tissues: Mild soft tissue edema within the posterior paraspinous soft tissues at the level of L2-3, in keeping with the acute hyperextension injury. Scattered stranding about the aorta and within the retroperitoneal space, likely reflecting blood products, seen and  described on prior CT. A CTA to evaluate for potential aortic injury is currently ordered at the time of this dictation. Small T2 hyperintense left renal cyst noted, benign in appearance. 2.9 cm exophytic T2 hypointense, T1 hyperintense lesion seen extending from the medial aspect of the right kidney (series  11, image 11), indeterminate. Cholelithiasis noted as well. Disc levels: T12-L1: Small right paracentral disc protrusion minimally flattens the right ventral thecal sac. Mild bilateral facet spurring. No spinal stenosis. Foramina remain patent. L1-2: Disc desiccation without significant disc bulge. Mild endplate spurring, asymmetric to the right. Mild bilateral facet hypertrophy. No significant spinal stenosis. Foramina remain patent. L2-3: Disc desiccation with mild disc bulge, asymmetric to the right. Associated reactive endplate spurring, also greater on the right. Superimposed acute distraction type fracture extending through the superior endplate of L3. Mild bilateral facet hypertrophy. Changes superimposed on short pedicles resultant mild spinal stenosis. Mild right L2 foraminal narrowing. Left neural foramina remains patent. L3-4: Disc desiccation with mild disc bulge. Reactive endplate spurring. Anterior annular fissure noted. Mild to moderate bilateral facet hypertrophy. Changes superimposed on short pedicles resultant mild canal with right lateral recess stenosis. Mild to moderate right with mild left L3 foraminal narrowing. L4-5: Disc desiccation with mild disc bulge. Anterior annular fissure. Mild reactive endplate spurring. Moderate right with mild left facet arthrosis. Changes superimposed on short pedicles resultant mild-to-moderate canal and right lateral recess stenosis. Mild right worse than left L4 foraminal narrowing. L5-S1: Disc desiccation with mild disc bulge. Reactive endplate spurring. Moderate left worse than right facet arthrosis. Mild epidural lipomatosis. No significant spinal  stenosis. Mild left L5 foraminal inferior right L foramen is patent. IMPRESSION: 1. Acute transversely oriented fracture extending through a bridging anterior osteophyte at the level of the L2-3 interspace, with extension through the superior endplate of L3. Finding consistent with an acute distraction type fracture/hyperextension injury. No significant height loss or retropulsion at the L3 vertebral body itself. No visible extension through the posterior elements or associated epidural hematoma. 2. Scattered stranding about the aorta and within the retroperitoneal space, likely reflecting blood products. Finding described on CT performed earlier the same day. A CTA to evaluate for potential aortic injury is currently ordered at the time of this dictation. 3. Underlying multilevel degenerative spondylosis and facet arthrosis as above. Resultant mild to moderate diffuse spinal stenosis at L2-3 through L4-5 as above. 4. 2.9 cm complex exophytic right renal lesion, indeterminate. While this finding may reflect a proteinaceous and/or hemorrhagic cyst, a possible renal neoplasm could also potentially have this appearance. Further evaluation with dedicated renal mass protocol CT and/or MRI recommended. Electronically Signed   By: Morene Hoard M.D.   On: 07/27/2024 19:29   CT L-SPINE NO CHARGE Result Date: 07/27/2024 EXAM: CT OF THE LUMBAR SPINE WITHOUT CONTRAST 07/27/2024 05:04:47 PM TECHNIQUE: CT of the lumbar spine was performed without the administration of intravenous contrast. Multiplanar reformatted images are provided for review. Automated exposure control, iterative reconstruction, and/or weight based adjustment of the mA/kV was utilized to reduce the radiation dose to as low as reasonably achievable. COMPARISON: None available. CLINICAL HISTORY: FINDINGS: BONES AND ALIGNMENT: Normal vertebral body heights. Continuous anterior osteophyte formation with acute widened fracture at the L2-L3 osteophyte. No  suspicious bone lesion. Normal alignment. DEGENERATIVE CHANGES: Mild intervertebral disc space narrowing. Multilevel moderate degenerative changes. SOFT TISSUES: No acute abnormality. IMPRESSION: 1. Acute widened fracture at the L2-L3 osteophyte consistent with hyperextension injury. Recommend MRI lumbar spine. 2. Please see separately dictated ct abd/pelvis 07/27/24. Electronically signed by: Morgane Naveau MD 07/27/2024 05:36 PM EST RP Workstation: [id number]   CT T-SPINE NO CHARGE Result Date: 07/27/2024 EXAM: CT THORACIC SPINE WITHOUT CONTRAST 07/27/2024 05:04:47 PM TECHNIQUE: CT of the thoracic spine was performed without the administration of intravenous contrast. Multiplanar reformatted images are provided for  review. Automated exposure control, iterative reconstruction, and/or weight based adjustment of the mA/kV was utilized to reduce the radiation dose to as low as reasonably achievable. COMPARISON: None available. CLINICAL HISTORY: FINDINGS: BONES AND ALIGNMENT: Normal vertebral body heights. No acute fracture or suspicious bone lesion. Normal alignment. DEGENERATIVE CHANGES: No significant degenerative changes. SOFT TISSUES: No acute abnormality. IMPRESSION: 1. No acute abnormality of the thoracic spine. Electronically signed by: Morgane Naveau MD 07/27/2024 05:32 PM EST RP Workstation: [id number]   CT CHEST ABDOMEN PELVIS WO CONTRAST Result Date: 07/27/2024 EXAM: CT CHEST, ABDOMEN AND PELVIS WITHOUT CONTRAST 07/27/2024 05:04:47 PM TECHNIQUE: CT of the chest, abdomen and pelvis was performed without the administration of intravenous contrast. Multiplanar reformatted images are provided for review. Automated exposure control, iterative reconstruction, and/or weight based adjustment of the mA/kV was utilized to reduce the radiation dose to as low as reasonably achievable. COMPARISON: 02/18/2024 CLINICAL HISTORY: Polytrauma, blunt. Polytrauma due to blunt force injury. FINDINGS: CHEST: MEDIASTINUM AND  LYMPH NODES: Mild cardiomegaly. At least 3-vessel coronary artery calcifications. Pericardium is unremarkable. The central airways are clear. No mediastinal, hilar or axillary lymphadenopathy. LUNGS AND PLEURA: 3.5 x 2 cm left lower lobe subpleural soft tissue density. Trace loculated left pleural effusion. Associated pleural calcifications (7:127). No focal consolidation or pulmonary edema. No pneumothorax. ABDOMEN AND PELVIS: LIVER: Unremarkable. GALLBLADDER AND BILE DUCTS: Calcified gallstone within the gallbladder lumen. No gallbladder wall thickening or pericholecystic fluid. No biliary ductal dilatation. SPLEEN: No acute abnormality. PANCREAS: Diffusely atrophic pancreas. No focal lesion. Otherwise normal pancreatic contour. No surrounding inflammatory changes. No main pancreatic ductal dilatation. ADRENAL GLANDS: No acute abnormality. KIDNEYS, URETERS AND BLADDER: There is a 2.9 x 1.9 cm right renal mass with a density of 64 Hounsfield units. There is a 6 mm right nephrolithiasis. There is a 2 mm left nephrolithiasis. Nonspecific trace bilateral perinephric fat stranding. No hydroureteronephrosis bilaterally. No ureterolithiasis. Urinary bladder is unremarkable. The prostate is unremarkable. GI AND BOWEL: Small hiatal hernia. Stomach demonstrates no acute abnormality. No small or large bowel thickening or dilatation. The appendix is not definitely identified with no inflammatory changes in the right lower quadrant to suggest acute appendicitis. Colic diverticulosis. There is no bowel obstruction. REPRODUCTIVE ORGANS: No acute abnormality. PERITONEUM AND RETROPERITONEUM: Retroperitoneal periaortic fat stranding along the infrarenal abdominal aorta. No ascites. No free air. VASCULATURE: Aorta is normal in caliber. Mild to moderate atherosclerotic plaque. Markedly limited evaluation of the abdominal aorta on this noncontrast study. ABDOMINAL AND PELVIS LYMPH NODES: No lymphadenopathy. REPRODUCTIVE ORGANS: No  acute abnormality. BONES AND SOFT TISSUES: Please see separately dictated CT thoracolumbar spine 07/27/2024. Old healed right posterior rib fractures. Old healed left rib fractures. No acute rib fracture. No acute fracture or dislocation of the shoulders or hips. No acute pelvic fracture or diastasis. No acute sacral fracture. No sternal fracture. No clavicle or scapular fracture with the clavicles partially visualized. Bilateral fat-containing inguinal hernias. No large soft tissue hematoma. No focal soft tissue abnormality. IMPRESSION: 1. Retroperitoneal periaortic fat stranding along the infrarenal abdominal aorta with markedly limited aortic evaluation on this study, and vascular injury cannot be excluded; consider CTA abdomen and pelvis for further evaluation. Finding likely related to lumbar fracture. 2. A 3.5 x 2 cm left lower lobe subpleural soft tissue density with trace loculated left pleural effusion and associated pleural calcifications. 3. An indeterminate 2.9 x 1.9 cm right renal mass measuring 64 Hounsfield units, indeterminate; recommend renal protocol MRI or CT without and with contrast for characterization. 4. Other, non-acute  and/or normal findings as above. Electronically signed by: Morgane Naveau MD 07/27/2024 05:31 PM EST RP Workstation: [id number]   CT CERVICAL SPINE WO CONTRAST Result Date: 07/27/2024 EXAM: CT CERVICAL SPINE WITHOUT CONTRAST 07/27/2024 05:04:47 PM TECHNIQUE: CT of the cervical spine was performed without the administration of intravenous contrast. Multiplanar reformatted images are provided for review. Automated exposure control, iterative reconstruction, and/or weight based adjustment of the mA/kV was utilized to reduce the radiation dose to as low as reasonably achievable. COMPARISON: None available. CLINICAL HISTORY: Polytrauma, blunt. The patient sustained multiple injuries due to blunt force trauma. FINDINGS: BONES AND ALIGNMENT: Grade 1 anterolisthesis of C3 on C4  and C4 on C5. No acute fracture or traumatic malalignment. DEGENERATIVE CHANGES: Bulky anterior osteophyte. Grade 1 anterolisthesis of C3 on C4 and C4 on C5. SOFT TISSUES: No prevertebral soft tissue swelling. Atherosclerotic plaque of the carotid arteries. IMPRESSION: 1. No evidence of acute traumatic injury. Electronically signed by: Morgane Naveau MD 07/27/2024 05:13 PM EST RP Workstation: [id number]   CT HEAD WO CONTRAST Result Date: 07/27/2024 EXAM: CT HEAD WITHOUT CONTRAST 07/27/2024 05:04:47 PM TECHNIQUE: CT of the head was performed without the administration of intravenous contrast. Automated exposure control, iterative reconstruction, and/or weight based adjustment of the mA/kV was utilized to reduce the radiation dose to as low as reasonably achievable. COMPARISON: None available. CLINICAL HISTORY: The patient experienced moderate to severe head trauma. FINDINGS: BRAIN AND VENTRICLES: Mild generalized cerebral volume loss and mild periventricular white matter disease. Moderate calcific atheromatous disease within the carotid siphons. No acute hemorrhage. No evidence of acute infarct. No hydrocephalus. No extra-axial collection. No mass effect or midline shift. ORBITS: Status post bilateral lens replacement. SINUSES: No acute abnormality. SOFT TISSUES AND SKULL: A 1.1 cm left frontal midline frontal scalp laceration. No retained radioopaque foreign body. No skull fracture. IMPRESSION: 1. No acute intracranial abnormality. 2. A 1.1 cm left frontal midline frontal scalp laceration without retained radiopaque foreign body. Electronically signed by: Morgane Naveau MD 07/27/2024 05:10 PM EST RP Workstation: [id number]   DG Elbow Complete Left Result Date: 07/27/2024 CLINICAL DATA:  Blunt trauma EXAM: LEFT ELBOW - COMPLETE 3+ VIEW COMPARISON:  None Available. FINDINGS: No fracture or malalignment. There are mild degenerative changes. No significant elbow effusion IMPRESSION: No acute osseous abnormality.  Electronically Signed   By: Luke Bun M.D.   On: 07/27/2024 15:51   DG Pelvis Portable Result Date: 07/27/2024 CLINICAL DATA:  Trauma EXAM: PORTABLE PELVIS 1-2 VIEWS COMPARISON:  06/13/2018, 02/16/2021 FINDINGS: SI joints are non widened. Pubic symphysis and rami are stable in appearance. No definitive fracture or malalignment. Mild femoroacetabular degenerative changes bilaterally IMPRESSION: No acute osseous abnormality. Cross-sectional imaging follow-up if persistent concern for pelvic or hip fracture. Electronically Signed   By: Luke Bun M.D.   On: 07/27/2024 15:50   DG Chest Port 1 View Result Date: 07/27/2024 CLINICAL DATA:  Trauma EXAM: PORTABLE CHEST 1 VIEW COMPARISON:  10/07/2023, chest CT 08/14/2023 FINDINGS: Mild cardiomegaly with aortic atherosclerosis. Chronic pleuroparenchymal changes of the left thorax with multiple old rib fractures. IMPRESSION: No active disease. Chronic pleuroparenchymal changes of the left thorax with multiple old rib fractures. Electronically Signed   By: Luke Bun M.D.   On: 07/27/2024 15:48    Microbiology: Results for orders placed or performed in visit on 07/27/24  Microscopic Examination     Status: Abnormal   Collection Time: 07/27/24 11:23 AM   Urine  Result Value Ref Range Status   WBC, UA 6-10 (A) 0 -  5 /hpf Final   RBC, Urine 11-30 (A) 0 - 2 /hpf Final   Epithelial Cells (non renal) 0-10 0 - 10 /hpf Final   Mucus, UA Present (A) Not Estab. Final   Bacteria, UA Few (A) None seen/Few Final    Labs: CBC: Recent Labs  Lab 07/27/24 1510 07/27/24 1512 07/28/24 0503  WBC 6.9  --  9.2  HGB 13.6 14.6 12.4*  HCT 41.2 43.0 37.1*  MCV 97.6  --  96.9  PLT 186  --  159   Basic Metabolic Panel: Recent Labs  Lab 07/27/24 1510 07/27/24 1512 07/28/24 0503  NA 140 141 139  K 4.5 4.5 4.5  CL 103 103 105  CO2 21*  --  23  GLUCOSE 227* 213* 148*  BUN 24* 26* 22  CREATININE 1.45* 1.50* 1.19  CALCIUM  9.4  --  9.0  MG  --   --  2.2   PHOS  --   --  3.4   Liver Function Tests: Recent Labs  Lab 07/27/24 1510 07/28/24 0503  AST 26 20  ALT 12 11  ALKPHOS 115 103  BILITOT 0.8 1.1  PROT 7.0 6.1*  ALBUMIN 4.4 3.8   CBG: Recent Labs  Lab 07/28/24 1932 07/29/24 0004 07/29/24 0412 07/29/24 0732 07/29/24 1116  GLUCAP 118* 119* 125* 99 108*    Discharge time spent:  35 minutes.  Signed: Eric Nunnery, MD [facility] 07/29/2024 "

## 2024-07-29 NOTE — Progress Notes (Signed)
 1215: patient off floor to CT. Wife stated was starting to hallucinate prior to being given oxycodone  and the hallucinations were getting worse prior to being taken to CT.

## 2024-07-29 NOTE — Progress Notes (Signed)
 Mobility Specialist Progress Note:    07/29/24 1436  Mobility  Activity Ambulated with assistance  Level of Assistance Minimal assist, patient does 75% or more  Assistive Device Front wheel walker  Distance Ambulated (ft) 50 ft  Range of Motion/Exercises Active;All extremities  Activity Response Tolerated well  Mobility Referral Yes  Mobility visit 1 Mobility  Mobility Specialist Start Time (ACUTE ONLY) 1436  Mobility Specialist Stop Time (ACUTE ONLY) 1456  Mobility Specialist Time Calculation (min) (ACUTE ONLY) 20 min   Pt received in bed, agreeable to mobility. Required MinA+2 for supine to sit, MinA for sit to stand, and CGA to ambulate with RW. Tolerated well, returned supine. Wife in room, alarm on. All needs met.  Tamaka Sawin Mobility Specialist Please contact via Special Educational Needs Teacher or  Rehab office at [phone]

## 2024-07-29 NOTE — Plan of Care (Incomplete)
  Problem: Education: Goal: Knowledge of General Education information will improve Description: Including pain rating scale, medication(s)/side effects and non-pharmacologic comfort measures Outcome: Progressing   Problem: Health Behavior/Discharge Planning: Goal: Ability to manage health-related needs will improve Outcome: Progressing   Problem: Activity: Goal: Risk for activity intolerance will decrease Outcome: Progressing   Problem: Coping: Goal: Level of anxiety will decrease Outcome: Progressing   Problem: Pain Managment: Goal: General experience of comfort will improve and/or be controlled Outcome: Progressing   Problem: Safety: Goal: Ability to remain free from injury will improve Outcome: Progressing   Problem: Skin Integrity: Goal: Risk for impaired skin integrity will decrease Outcome: Progressing

## 2024-07-29 NOTE — TOC Progression Note (Signed)
 Transition of Care [facility]) - Progression Note    Patient Details  Name: Casey [name] MRN: [id number] Date of Birth: 1945/11/23  [facility] CM/SW Contact  Lucie Lunger, [place] Phone Number: 07/29/2024, 3:08 PM  Clinical Narrative:    CSW notes that [facility] did not accept pt for [facility] services. Referral sent out to other agencies. [facility] is able to accept pt for [facility] PT/OT. CSW requested that MD place [facility] orders. TOC to follow.   Expected Discharge Plan: Home w Home Health Services Barriers to Discharge: Continued Medical Work up               Expected Discharge Plan and Services In-house Referral: Clinical Social Work Discharge Planning Services: CM Consult Post Acute Care Choice: Home Health Living arrangements for the past 2 months: Single Family Home                           HH Arranged: PT, OT           Social Drivers of Health (SDOH) Interventions SDOH Screenings   Food Insecurity: No Food Insecurity (07/28/2024)  Housing: Low Risk (07/28/2024)  Transportation Needs: No Transportation Needs (07/28/2024)  Utilities: Not At Risk (07/28/2024)  Alcohol Screen: Low Risk (03/17/2024)  Depression (PHQ2-9): Low Risk (03/17/2024)  Social Connections: Moderately Isolated (07/28/2024)  Tobacco Use: Low Risk (07/27/2024)    Readmission Risk Interventions     No data to display

## 2024-07-30 ENCOUNTER — Telehealth: Payer: Self-pay | Admitting: Cardiovascular Disease

## 2024-07-30 ENCOUNTER — Encounter: Payer: Self-pay | Admitting: *Deleted

## 2024-07-30 NOTE — Telephone Encounter (Signed)
" °  Patient is scheduled to have an echo on 2/11 then he is supposed to where a holter monitor for 14 days. Wife states patient fell 07/27/24 and broke a bone in his lower back and is wearing a brace and using a walker to walk. Mrs Vreeland would like to know if Dr Francyne still wants him to have these tests as scheduled or wait a little while until he is more healed. Please advise.  "

## 2024-07-30 NOTE — Telephone Encounter (Signed)
 Go ahead with original plan

## 2024-08-02 NOTE — Telephone Encounter (Signed)
 Attempted to call spouse. Left message on VM. Will send MyChart with Dr Francyne plan to continue with echo and wearing monitor, as originally planned.

## 2024-08-02 NOTE — Telephone Encounter (Signed)
 Spoke with patient wife, Rock, per [place]. Per Dr Francyne: Yes please wear the monitor for 2 weeks, whether using the walker /falling or not.  Informed wife of Dr Francyne recommendations. Wife asking if she needed to wait until after the echo scheduled for monitor placement. Educated wife that he had plenty of time to wear the monitor for 14 days prior to his echo on 2/11. Advised her to place monitor on patient today after his shower and to not submerge etc during the 14 days. Also educated her that there should be specific instructions that came with the monitor and how to return it etc.   Verbalizes understanding of plan.

## 2024-08-02 NOTE — Telephone Encounter (Signed)
 Spoke with spouse, Rock per [place]. Updated that Dr Francyne would like to continue with the plan to have echo and pt to wear holter monitor. Wife asking if patient still needs to wear the monitor since he has the walker now and isn't falling. Attempted to suggest he wear it, per Dr Francyne, but insisting on asking him this specifically. BP this am 166/80, HR 61. Pt has not taken BP medications yet. Suggested taking BP 2 hours after taking medications. Will send to Dr Francyne for confirmation. Verbalizes understanding of plan.

## 2024-08-02 NOTE — Telephone Encounter (Signed)
  Wife is returning call 

## 2024-08-02 NOTE — Telephone Encounter (Signed)
 Yes please wear the monitor for 2 weeks, whether using the walker /falling or not.

## 2024-08-24 ENCOUNTER — Ambulatory Visit: Admitting: Psychiatry

## 2024-08-25 ENCOUNTER — Ambulatory Visit (HOSPITAL_COMMUNITY)

## 2024-08-26 ENCOUNTER — Ambulatory Visit: Admitting: Psychiatry

## 2024-10-07 ENCOUNTER — Inpatient Hospital Stay: Admitting: *Deleted

## 2024-11-11 ENCOUNTER — Other Ambulatory Visit

## 2024-11-26 ENCOUNTER — Ambulatory Visit: Admitting: Urology
